# Patient Record
Sex: Female | Born: 1955 | Race: White | Hispanic: No | Marital: Single | Smoking: Never smoker
Health system: Southern US, Community
[De-identification: ages and names within clinical notes are randomized; demographics above are authoritative.]

## PROBLEM LIST (undated history)

## (undated) DIAGNOSIS — D509 Iron deficiency anemia, unspecified: Secondary | ICD-10-CM

## (undated) DIAGNOSIS — I2510 Atherosclerotic heart disease of native coronary artery without angina pectoris: Secondary | ICD-10-CM

## (undated) DIAGNOSIS — E119 Type 2 diabetes mellitus without complications: Secondary | ICD-10-CM

## (undated) DIAGNOSIS — F419 Anxiety disorder, unspecified: Secondary | ICD-10-CM

## (undated) DIAGNOSIS — E039 Hypothyroidism, unspecified: Secondary | ICD-10-CM

## (undated) DIAGNOSIS — M1990 Unspecified osteoarthritis, unspecified site: Secondary | ICD-10-CM

## (undated) DIAGNOSIS — R002 Palpitations: Secondary | ICD-10-CM

## (undated) DIAGNOSIS — E559 Vitamin D deficiency, unspecified: Secondary | ICD-10-CM

## (undated) DIAGNOSIS — K219 Gastro-esophageal reflux disease without esophagitis: Secondary | ICD-10-CM

## (undated) DIAGNOSIS — C801 Malignant (primary) neoplasm, unspecified: Secondary | ICD-10-CM

## (undated) DIAGNOSIS — E538 Deficiency of other specified B group vitamins: Secondary | ICD-10-CM

## (undated) DIAGNOSIS — I10 Essential (primary) hypertension: Secondary | ICD-10-CM

## (undated) HISTORY — PX: BREAST BIOPSY: SHX20

## (undated) HISTORY — DX: Essential (primary) hypertension: I10

## (undated) HISTORY — DX: Iron deficiency anemia, unspecified: D50.9

## (undated) HISTORY — PX: TONSILLECTOMY: SUR1361

## (undated) HISTORY — DX: Palpitations: R00.2

## (undated) HISTORY — PX: ABDOMINAL HYSTERECTOMY: SHX81

## (undated) HISTORY — DX: Vitamin D deficiency, unspecified: E55.9

## (undated) HISTORY — DX: Type 2 diabetes mellitus without complications: E11.9

## (undated) HISTORY — DX: Atherosclerotic heart disease of native coronary artery without angina pectoris: I25.10

## (undated) HISTORY — DX: Unspecified osteoarthritis, unspecified site: M19.90

## (undated) HISTORY — DX: Deficiency of other specified B group vitamins: E53.8

## (undated) HISTORY — DX: Gastro-esophageal reflux disease without esophagitis: K21.9

## (undated) HISTORY — DX: Anxiety disorder, unspecified: F41.9

---

## 2001-11-23 DIAGNOSIS — E119 Type 2 diabetes mellitus without complications: Secondary | ICD-10-CM

## 2001-11-23 HISTORY — DX: Type 2 diabetes mellitus without complications: E11.9

## 2007-11-24 DIAGNOSIS — I10 Essential (primary) hypertension: Secondary | ICD-10-CM

## 2007-11-24 HISTORY — DX: Essential (primary) hypertension: I10

## 2008-07-19 ENCOUNTER — Emergency Department (HOSPITAL_COMMUNITY): Admission: EM | Admit: 2008-07-19 | Discharge: 2008-07-19 | Payer: Self-pay | Admitting: Emergency Medicine

## 2010-03-31 ENCOUNTER — Emergency Department (HOSPITAL_COMMUNITY): Admission: EM | Admit: 2010-03-31 | Discharge: 2010-03-31 | Payer: Self-pay | Admitting: Emergency Medicine

## 2010-06-02 ENCOUNTER — Ambulatory Visit (HOSPITAL_COMMUNITY): Admission: RE | Admit: 2010-06-02 | Discharge: 2010-06-02 | Payer: Self-pay | Admitting: Family Medicine

## 2010-12-10 ENCOUNTER — Emergency Department (HOSPITAL_COMMUNITY)
Admission: EM | Admit: 2010-12-10 | Discharge: 2010-12-11 | Payer: Self-pay | Source: Home / Self Care | Admitting: Emergency Medicine

## 2010-12-15 LAB — CBC
HCT: 43.6 % (ref 36.0–46.0)
MCHC: 33.9 g/dL (ref 30.0–36.0)
Platelets: 169 10*3/uL (ref 150–400)
RBC: 5.1 MIL/uL (ref 3.87–5.11)
WBC: 8.1 10*3/uL (ref 4.0–10.5)

## 2010-12-15 LAB — URINALYSIS, ROUTINE W REFLEX MICROSCOPIC
Protein, ur: NEGATIVE mg/dL
Urine Glucose, Fasting: >1000 mg/dL — AB
pH: 5.5 (ref 5.0–8.0)

## 2010-12-15 LAB — URINE MICROSCOPIC-ADD ON

## 2010-12-15 LAB — DIFFERENTIAL
Basophils Relative: 1 % (ref 0–1)
Eosinophils Absolute: 0.2 10*3/uL (ref 0.0–0.7)
Eosinophils Relative: 2 % (ref 0–5)
Lymphocytes Relative: 12 % (ref 12–46)
Lymphs Abs: 1 10*3/uL (ref 0.7–4.0)
Monocytes Absolute: 0.6 10*3/uL (ref 0.1–1.0)
Monocytes Relative: 8 % (ref 3–12)
Neutro Abs: 6.3 10*3/uL (ref 1.7–7.7)
Neutrophils Relative %: 78 % — ABNORMAL HIGH (ref 43–77)

## 2010-12-15 LAB — BASIC METABOLIC PANEL
Calcium: 8.9 mg/dL (ref 8.4–10.5)
GFR calc non Af Amer: >60 mL/min (ref >60)
Glucose, Bld: 400 mg/dL — ABNORMAL HIGH (ref 70–99)

## 2011-01-10 ENCOUNTER — Emergency Department (HOSPITAL_COMMUNITY)
Admission: EM | Admit: 2011-01-10 | Discharge: 2011-01-10 | Disposition: A | Discharge status: Home / Self Care | Payer: Medicare Other | Attending: Emergency Medicine | Admitting: Emergency Medicine

## 2011-01-10 DIAGNOSIS — M129 Arthropathy, unspecified: Secondary | ICD-10-CM | POA: Insufficient documentation

## 2011-01-10 DIAGNOSIS — R21 Rash and other nonspecific skin eruption: Secondary | ICD-10-CM | POA: Insufficient documentation

## 2011-01-10 DIAGNOSIS — E119 Type 2 diabetes mellitus without complications: Secondary | ICD-10-CM | POA: Insufficient documentation

## 2011-01-10 DIAGNOSIS — I10 Essential (primary) hypertension: Secondary | ICD-10-CM | POA: Insufficient documentation

## 2011-01-10 DIAGNOSIS — R0602 Shortness of breath: Secondary | ICD-10-CM | POA: Insufficient documentation

## 2011-02-10 LAB — BASIC METABOLIC PANEL WITH GFR
Chloride: 98 meq/L (ref 96–112)
Creatinine, Ser: 0.78 mg/dL (ref 0.4–1.2)
GFR calc Af Amer: >60 mL/min (ref >60)
GFR calc non Af Amer: >60 mL/min (ref >60)

## 2011-02-10 LAB — BASIC METABOLIC PANEL
BUN: 9 mg/dL (ref 6–23)
CO2: 28 mEq/L (ref 19–32)
Calcium: 9.1 mg/dL (ref 8.4–10.5)
Glucose, Bld: 337 mg/dL — ABNORMAL HIGH (ref 70–99)
Potassium: 4.2 mEq/L (ref 3.5–5.1)
Sodium: 132 mEq/L — ABNORMAL LOW (ref 135–145)

## 2011-02-10 LAB — CBC
HCT: 41.1 % (ref 36.0–46.0)
Hemoglobin: 14.3 g/dL (ref 12.0–15.0)
MCHC: 34.8 g/dL (ref 30.0–36.0)
MCV: 86.6 fL (ref 78.0–100.0)
Platelets: 195 K/uL (ref 150–400)
RBC: 4.74 MIL/uL (ref 3.87–5.11)
RDW: 13.9 % (ref 11.5–15.5)
WBC: 9.4 K/uL (ref 4.0–10.5)

## 2011-02-10 LAB — URINALYSIS, ROUTINE W REFLEX MICROSCOPIC
Bilirubin Urine: NEGATIVE
Glucose, UA: >1000 mg/dL — AB
Hgb urine dipstick: NEGATIVE
Ketones, ur: NEGATIVE mg/dL
Leukocytes, UA: NEGATIVE
Nitrite: NEGATIVE
Protein, ur: NEGATIVE mg/dL
Specific Gravity, Urine: 1.02 (ref 1.005–1.030)
Urobilinogen, UA: 0.2 mg/dL (ref 0.0–1.0)
pH: 5.5 (ref 5.0–8.0)

## 2011-02-10 LAB — GLUCOSE, CAPILLARY
Glucose-Capillary: 196 mg/dL — ABNORMAL HIGH (ref 70–99)
Glucose-Capillary: 306 mg/dL — ABNORMAL HIGH (ref 70–99)

## 2011-02-10 LAB — DIFFERENTIAL
Basophils Absolute: 0.1 10*3/uL (ref 0.0–0.1)
Basophils Relative: 1 % (ref 0–1)
Eosinophils Absolute: 0.3 10*3/uL (ref 0.0–0.7)
Eosinophils Relative: 3 % (ref 0–5)
Lymphocytes Relative: 27 % (ref 12–46)
Lymphs Abs: 2.5 K/uL (ref 0.7–4.0)
Monocytes Absolute: 0.6 10*3/uL (ref 0.1–1.0)
Monocytes Relative: 7 % (ref 3–12)
Neutro Abs: 5.8 10*3/uL (ref 1.7–7.7)
Neutrophils Relative %: 62 % (ref 43–77)

## 2011-02-10 LAB — KETONES, QUALITATIVE: Acetone, Bld: NEGATIVE

## 2011-02-10 LAB — URINE MICROSCOPIC-ADD ON

## 2011-03-31 ENCOUNTER — Other Ambulatory Visit: Payer: Self-pay | Admitting: Family Medicine

## 2011-03-31 DIAGNOSIS — R921 Mammographic calcification found on diagnostic imaging of breast: Secondary | ICD-10-CM

## 2011-04-02 ENCOUNTER — Encounter: Payer: Self-pay | Admitting: Cardiology

## 2011-04-03 ENCOUNTER — Ambulatory Visit (INDEPENDENT_AMBULATORY_CARE_PROVIDER_SITE_OTHER): Payer: Medicare Other | Admitting: Cardiology

## 2011-04-03 ENCOUNTER — Encounter: Payer: Self-pay | Admitting: Cardiology

## 2011-04-03 VITALS — BP 120/77 | HR 70 | Ht 67.0 in | Wt 325.0 lb

## 2011-04-03 DIAGNOSIS — E118 Type 2 diabetes mellitus with unspecified complications: Secondary | ICD-10-CM | POA: Insufficient documentation

## 2011-04-03 DIAGNOSIS — I10 Essential (primary) hypertension: Secondary | ICD-10-CM | POA: Insufficient documentation

## 2011-04-03 DIAGNOSIS — R002 Palpitations: Secondary | ICD-10-CM

## 2011-04-03 DIAGNOSIS — E119 Type 2 diabetes mellitus without complications: Secondary | ICD-10-CM

## 2011-04-03 DIAGNOSIS — R0609 Other forms of dyspnea: Secondary | ICD-10-CM

## 2011-04-03 DIAGNOSIS — E66812 Obesity, class 2: Secondary | ICD-10-CM | POA: Insufficient documentation

## 2011-04-03 DIAGNOSIS — R0600 Dyspnea, unspecified: Secondary | ICD-10-CM

## 2011-04-03 NOTE — Assessment & Plan Note (Addendum)
Long-standing and episodic as noted, with increased frequency, sometimes associated with dizziness but no syncope. Also generalized feeling of chest discomfort with these symptoms, not with exertion. Resting ECG today shows normal sinus rhythm with leftward axis, normal intervals. Reports compliance with beta blocker therapy. Interesting to note low TSH, possibility of hyperthyroidism, which could be a contributor. Endocrinology consultation is pending. Further objective information will be obtained including an echocardiogram as well as a 48-hour Holter monitor. We will inform her of the results, and if further assessment is needed.

## 2011-04-03 NOTE — Assessment & Plan Note (Signed)
Blood pressure well-controlled today. 

## 2011-04-03 NOTE — Progress Notes (Signed)
Clinical Summary [name] is a 55 y.o.female referred for cardiology consultation by Dr. Isac Sarna with history of tachypalpitations and chest discomfort.  She describes a 2-3 year history of palpitations, described as a feeling of fast forceful heartbeat at times, other times a skip. Does not appear to be any clear precipitant. In general the symptoms have become more frequent, occurring a few times a day at this point. Sometimes she states of dizziness when this happens, but no syncope. Feels a general discomfort in her chest with the symptoms only, not with exertion.  She states she has been on beta blocker therapy for the symptoms, with some improvement, although no resolution. She has never worn a cardiac monitor.  Recent lab work showed creatinine 0.6, BUN 9, sodium 139, potassium 3.9, glucose 190, SGOT 47, SGPT 35, TSH 0.03, cholesterol 165, HDL 37, LDL 109, triglycerides 94, hemoglobin 13.7, platelets 194. Low TSH is noted, and the patient indicates that she has an endocrinology consultation pending early next week.  Allergies  Allergen Reactions  . Sulfa Antibiotics Nausea And Vomiting  . Doxepin Rash    Current outpatient prescriptions:escitalopram (LEXAPRO) 10 MG tablet, Take 10 mg by mouth daily.  , Disp: , Rfl: ;  gabapentin (NEURONTIN) 400 MG capsule, Take 400 mg by mouth 3 (three) times daily.  , Disp: , Rfl: ;  glimepiride (AMARYL) 4 MG tablet, Take 4 mg by mouth 2 (two) times daily. , Disp: , Rfl: ;  losartan (COZAAR) 100 MG tablet, Take 100 mg by mouth daily.  , Disp: , Rfl:  metFORMIN (GLUCOPHAGE) 500 MG tablet, Take 1,000 mg by mouth 2 (two) times daily with a meal.  , Disp: , Rfl: ;  metoprolol (TOPROL-XL) 100 MG 24 hr tablet, Take 100 mg by mouth daily.  , Disp: , Rfl: ;  mirtazapine (REMERON) 30 MG tablet, Take 30 mg by mouth at bedtime.  , Disp: , Rfl: ;  pantoprazole (PROTONIX) 40 MG tablet, Take 40 mg by mouth daily.  , Disp: , Rfl:  traMADol (ULTRAM) 50 MG tablet,  Take 50 mg by mouth every 6 (six) hours as needed.  , Disp: , Rfl:   Past Medical History  Diagnosis Date  . Type 2 diabetes mellitus 2003  . Palpitations     Tachypalpitations on beta blockers since 2010  . Essential hypertension, benign 2009  . GERD (gastroesophageal reflux disease)   . Anxiety   . Osteoarthritis     Past Surgical History  Procedure Date  . Tonsillectomy   . Abdominal hysterectomy     History of cervical cancer    Family History  Problem Relation Age of Onset  . Diabetes Father   . Stroke Sister 45  . Heart failure Mother   . Hypertension Father     Social History [name] reports that she has never smoked. She has never used smokeless tobacco. [name] reports that she does not drink alcohol.  Review of Systems No orthopnea or PND. No syncope. No melena or hematochezia. NYHA class II dyspnea on exertion. Otherwise reviewed and negative.  Physical Examination Filed Vitals:   04/03/11 1354  BP: 120/77  Pulse: 70  Morbidly obese woman in no acute distress. HEENT: Conjunctiva and lids normal, oropharynx with moist mucosa. Neck: Supple, increased girth, no obvious elevated JVP or carotid bruits, no thyromegaly. Lungs: Diminished but clear overall, nonlabored. Cardiac: Regular rate and rhythm, no S3 gallop or rub, distant overall. Abdomen: Obese, nontender, bowel sounds present. Skin:  Warm and dry. Extremities: Trace edema and stasis, distal pulses one plus. Musculoskeletal: No kyphosis. Neuropsychiatric: Alert and oriented x3, affect appropriate.   ECG Normal sinus rhythm with leftward axis, normal intervals.  Problem List and Plan

## 2011-04-03 NOTE — Assessment & Plan Note (Signed)
Followed by Dr Hall 

## 2011-04-03 NOTE — Assessment & Plan Note (Signed)
Weight loss is indicated. 

## 2011-04-03 NOTE — Patient Instructions (Signed)
   48 hour holter monitor applied today.  Return on Monday.   Your physician has requested that you have an echocardiogram. Echocardiography is a painless test that uses sound waves to create images of your heart. It provides your doctor with information about the size and shape of your heart and how well your heart's chambers and valves are working. This procedure takes approximately one hour. There are no restrictions for this procedure. If the results of your test are normal or stable, you will receive a letter.  If they are abnormal, the nurse will contact you by phone. Follow up as needed.

## 2011-04-06 ENCOUNTER — Ambulatory Visit
Admission: RE | Admit: 2011-04-06 | Discharge: 2011-04-06 | Disposition: A | Discharge status: Home / Self Care | Payer: Medicare Other | Source: Ambulatory Visit | Attending: Family Medicine | Admitting: Family Medicine

## 2011-04-06 ENCOUNTER — Other Ambulatory Visit (HOSPITAL_COMMUNITY): Payer: Self-pay | Admitting: "Endocrinology

## 2011-04-06 DIAGNOSIS — R921 Mammographic calcification found on diagnostic imaging of breast: Secondary | ICD-10-CM

## 2011-04-06 DIAGNOSIS — E0590 Thyrotoxicosis, unspecified without thyrotoxic crisis or storm: Secondary | ICD-10-CM

## 2011-04-08 ENCOUNTER — Encounter (HOSPITAL_COMMUNITY): Payer: Self-pay

## 2011-04-08 ENCOUNTER — Encounter (HOSPITAL_COMMUNITY)
Admission: RE | Admit: 2011-04-08 | Discharge: 2011-04-08 | Disposition: A | Discharge status: Home / Self Care | Payer: Medicare Other | Source: Ambulatory Visit | Attending: "Endocrinology | Admitting: "Endocrinology

## 2011-04-08 DIAGNOSIS — R197 Diarrhea, unspecified: Secondary | ICD-10-CM | POA: Insufficient documentation

## 2011-04-08 DIAGNOSIS — E0590 Thyrotoxicosis, unspecified without thyrotoxic crisis or storm: Secondary | ICD-10-CM | POA: Insufficient documentation

## 2011-04-08 DIAGNOSIS — R5381 Other malaise: Secondary | ICD-10-CM | POA: Insufficient documentation

## 2011-04-08 DIAGNOSIS — R110 Nausea: Secondary | ICD-10-CM | POA: Insufficient documentation

## 2011-04-08 MED ORDER — SODIUM IODIDE I 131 CAPSULE
8.0000 | Freq: Once | INTRAVENOUS | Status: AC | PRN
  Administered 2011-04-08: 8 via ORAL

## 2011-04-09 ENCOUNTER — Encounter (HOSPITAL_COMMUNITY): Payer: Medicare Other

## 2011-04-09 MED ORDER — SODIUM PERTECHNETATE TC 99M INJECTION
10.0000 | Freq: Once | INTRAVENOUS | Status: AC | PRN
  Administered 2011-04-09: 10 via INTRAVENOUS

## 2011-04-10 DIAGNOSIS — I2510 Atherosclerotic heart disease of native coronary artery without angina pectoris: Secondary | ICD-10-CM

## 2011-04-15 ENCOUNTER — Telehealth: Payer: Self-pay | Admitting: *Deleted

## 2011-04-15 NOTE — Telephone Encounter (Signed)
Also need to notify pt regarding Echo results.

## 2011-04-15 NOTE — Telephone Encounter (Signed)
Attempted to reach pt regarding results of Holter. Per Dr. Diona Browner, Holter shows: "Sinus Rhythm (51-142). Rare PVC's and PAC's with single brief burst of PSVT. No sustained arrhythmia or pauses.  No answer, no voicemail.

## 2011-04-17 NOTE — Telephone Encounter (Signed)
Pt notified of echo and monitor results.

## 2012-02-17 ENCOUNTER — Emergency Department (HOSPITAL_COMMUNITY)
Admission: EM | Admit: 2012-02-17 | Discharge: 2012-02-17 | Disposition: A | Discharge status: Home / Self Care | Payer: Medicare HMO | Attending: Emergency Medicine | Admitting: Emergency Medicine

## 2012-02-17 ENCOUNTER — Encounter (HOSPITAL_COMMUNITY): Payer: Self-pay | Admitting: *Deleted

## 2012-02-17 ENCOUNTER — Emergency Department (HOSPITAL_COMMUNITY): Payer: Medicare HMO

## 2012-02-17 DIAGNOSIS — L089 Local infection of the skin and subcutaneous tissue, unspecified: Secondary | ICD-10-CM

## 2012-02-17 DIAGNOSIS — K219 Gastro-esophageal reflux disease without esophagitis: Secondary | ICD-10-CM | POA: Insufficient documentation

## 2012-02-17 DIAGNOSIS — I10 Essential (primary) hypertension: Secondary | ICD-10-CM | POA: Insufficient documentation

## 2012-02-17 DIAGNOSIS — Z79899 Other long term (current) drug therapy: Secondary | ICD-10-CM | POA: Insufficient documentation

## 2012-02-17 DIAGNOSIS — E119 Type 2 diabetes mellitus without complications: Secondary | ICD-10-CM | POA: Insufficient documentation

## 2012-02-17 MED ORDER — DOXYCYCLINE HYCLATE 100 MG PO CAPS: 100.0000 mg | ORAL_CAPSULE | Freq: Two times a day (BID) | ORAL | Status: AC

## 2012-02-17 MED ORDER — VANCOMYCIN HCL IN DEXTROSE 1-5 GM/200ML-% IV SOLN
1000.0000 mg | Freq: Once | INTRAVENOUS | Status: AC
  Administered 2012-02-17: 1000 mg via INTRAVENOUS
  Filled 2012-02-17: qty 200

## 2012-02-17 NOTE — ED Notes (Signed)
C/o swelling/pain to left index finger at tip x 2 days; reports stuck this area with a pin, and some pus drained from site.

## 2012-02-17 NOTE — Discharge Instructions (Signed)
Follow up with your md Thursday for recheck

## 2012-02-18 NOTE — ED Provider Notes (Signed)
History     CSN: [id number]  Arrival date & time 02/17/12  0235   First MD Initiated Contact with Patient 02/17/12 [phone]      Chief Complaint  Patient presents with  . Hand Pain    (Consider location/radiation/quality/duration/timing/severity/associated sxs/prior treatment) Patient is a 56 y.o. female presenting with hand pain. The history is provided by the patient (Patient complains of pain to her left index finger. She has had redness and tenderness to the end of her finger for a couple days). No language interpreter was used.  Hand Pain This is a new problem. The current episode started yesterday. The problem occurs constantly. The problem has not changed since onset.Pertinent negatives include no chest pain, no abdominal pain and no headaches. The symptoms are aggravated by bending. The symptoms are relieved by nothing. She has tried nothing for the symptoms. The treatment provided no relief.    Past Medical History  Diagnosis Date  . Type 2 diabetes mellitus 2003  . Palpitations     Tachypalpitations on beta blockers since 2010  . Essential hypertension, benign 2009  . GERD (gastroesophageal reflux disease)   . Anxiety   . Osteoarthritis   . Diabetes mellitus     Past Surgical History  Procedure Date  . Tonsillectomy   . Abdominal hysterectomy     History of cervical cancer    Family History  Problem Relation Age of Onset  . Diabetes Father   . Stroke Sister 36  . Heart failure Mother   . Hypertension Father     History  Substance Use Topics  . Smoking status: Never Smoker   . Smokeless tobacco: Never Used  . Alcohol Use: No    OB History    Grav Para Term Preterm Abortions TAB SAB Ect Mult Living                  Review of Systems  Constitutional: Negative for fatigue.  HENT: Negative for congestion, sinus pressure and ear discharge.   Eyes: Negative for discharge.  Respiratory: Negative for cough.   Cardiovascular: Negative for chest pain.    Gastrointestinal: Negative for abdominal pain and diarrhea.  Genitourinary: Negative for frequency and hematuria.  Musculoskeletal: Negative for back pain.       Pain left index finger  Skin: Negative for rash.  Neurological: Negative for seizures and headaches.  Hematological: Negative.   Psychiatric/Behavioral: Negative for hallucinations.    Allergies  Sulfa antibiotics and Doxepin  Home Medications   Current Outpatient Rx  Name Route Sig Dispense Refill  . INSULIN DETEMIR 100 UNIT/ML [place] SOLN Subcutaneous Inject 20 Units into the skin at bedtime.    Marland Kitchen METHIMAZOLE 5 MG PO TABS Oral Take 2.5 mg by mouth daily.    Marland Kitchen PRAVASTATIN SODIUM 40 MG PO TABS Oral Take 40 mg by mouth daily.    Marland Kitchen DOXYCYCLINE HYCLATE 100 MG PO CAPS Oral Take 1 capsule (100 mg total) by mouth 2 (two) times daily. 20 capsule 0  . ESCITALOPRAM OXALATE 10 MG PO TABS Oral Take 10 mg by mouth daily.      Marland Kitchen GABAPENTIN 400 MG PO CAPS Oral Take 400 mg by mouth 3 (three) times daily.      Marland Kitchen GLIMEPIRIDE 4 MG PO TABS Oral Take 4 mg by mouth 2 (two) times daily.     Marland Kitchen LOSARTAN POTASSIUM 100 MG PO TABS Oral Take 100 mg by mouth daily.      Marland Kitchen METFORMIN HCL 500 MG  PO TABS Oral Take 1,000 mg by mouth 2 (two) times daily with a meal.      . METOPROLOL SUCCINATE ER 100 MG PO TB24 Oral Take 100 mg by mouth daily.      Marland Kitchen MIRTAZAPINE 30 MG PO TABS Oral Take 30 mg by mouth at bedtime.      Marland Kitchen PANTOPRAZOLE SODIUM 40 MG PO TBEC Oral Take 40 mg by mouth daily.      . TRAMADOL HCL 50 MG PO TABS Oral Take 50 mg by mouth every 6 (six) hours as needed.        BP 152/82  Pulse 85  Temp(Src) 98 F (36.7 C) (Oral)  Resp 18  Ht 5\' 8"  (1.727 m)  Wt 330 lb (149.687 kg)  BMI 50.18 kg/m2  SpO2 99%  Physical Exam  Constitutional: She is oriented to person, place, and time. She appears well-developed.  HENT:  Head: Normocephalic and atraumatic.  Eyes: Conjunctivae and EOM are normal. No scleral icterus.  Neck: Neck supple. No thyromegaly  present.  Cardiovascular: Normal rate and regular rhythm.  Exam reveals no gallop and no friction rub.   No murmur heard. Pulmonary/Chest: No stridor. She has no wheezes. She has no rales. She exhibits no tenderness.  Abdominal: She exhibits no distension. There is no tenderness. There is no rebound.  Musculoskeletal: Normal range of motion. She exhibits no edema.       Distal left index finger has some redness and tenderness to the PIP. Patient has full range of motion in that finger without any pain.  Lymphadenopathy:    She has no cervical adenopathy.  Neurological: She is oriented to person, place, and time. Coordination normal.  Skin: No rash noted. No erythema.  Psychiatric: She has a normal mood and affect. Her behavior is normal.    ED Course  Procedures (including critical care time)  Labs Reviewed - No data to display Dg Finger Index Left  02/17/2012  *RADIOLOGY REPORT*  Clinical Data: Left index finger pain and swelling.  LEFT INDEX FINGER 2+V  Comparison: None.  Findings: There is no evidence of fracture or dislocation.  No osseous erosions are seen to suggest osteomyelitis.  No radiopaque foreign bodies are seen.  The visualized joint spaces are grossly preserved.  Known soft tissue swelling is difficult to fully characterize on radiograph.  IMPRESSION: No evidence of osseous disruption; no radiopaque foreign bodies seen.  Original Report Authenticated By: Tonia Ghent, M.D.     1. Infected finger       MDM  Skin infection in distal left finger.  No evidence of tenosynovitis now.  Will tx with antibiotics and have pt seen the next day        [name], MD 02/18/12 [phone]

## 2013-03-18 ENCOUNTER — Encounter (HOSPITAL_COMMUNITY): Payer: Self-pay | Admitting: *Deleted

## 2013-03-18 ENCOUNTER — Emergency Department (HOSPITAL_COMMUNITY): Payer: Medicare HMO

## 2013-03-18 ENCOUNTER — Observation Stay (HOSPITAL_COMMUNITY)
Admission: EM | Admit: 2013-03-18 | Discharge: 2013-03-19 | Disposition: A | Discharge status: Home / Self Care | Payer: Medicare HMO | Attending: Internal Medicine | Admitting: Internal Medicine

## 2013-03-18 DIAGNOSIS — F411 Generalized anxiety disorder: Secondary | ICD-10-CM | POA: Insufficient documentation

## 2013-03-18 DIAGNOSIS — E0590 Thyrotoxicosis, unspecified without thyrotoxic crisis or storm: Secondary | ICD-10-CM | POA: Insufficient documentation

## 2013-03-18 DIAGNOSIS — E119 Type 2 diabetes mellitus without complications: Secondary | ICD-10-CM | POA: Insufficient documentation

## 2013-03-18 DIAGNOSIS — E785 Hyperlipidemia, unspecified: Secondary | ICD-10-CM | POA: Insufficient documentation

## 2013-03-18 DIAGNOSIS — K219 Gastro-esophageal reflux disease without esophagitis: Secondary | ICD-10-CM | POA: Insufficient documentation

## 2013-03-18 DIAGNOSIS — N390 Urinary tract infection, site not specified: Secondary | ICD-10-CM | POA: Insufficient documentation

## 2013-03-18 DIAGNOSIS — I10 Essential (primary) hypertension: Secondary | ICD-10-CM | POA: Insufficient documentation

## 2013-03-18 DIAGNOSIS — R55 Syncope and collapse: Principal | ICD-10-CM | POA: Insufficient documentation

## 2013-03-18 LAB — CBC WITH DIFFERENTIAL/PLATELET
Basophils Relative: 1 % (ref 0–1)
Eosinophils Absolute: 0.3 10*3/uL (ref 0.0–0.7)
Lymphs Abs: 4.1 10*3/uL — ABNORMAL HIGH (ref 0.7–4.0)
MCH: 28.2 pg (ref 26.0–34.0)
Neutrophils Relative %: 60 % (ref 43–77)
Platelets: 253 10*3/uL (ref 150–400)
RBC: 5.32 MIL/uL — ABNORMAL HIGH (ref 3.87–5.11)

## 2013-03-18 LAB — URINE MICROSCOPIC-ADD ON

## 2013-03-18 LAB — URINALYSIS, ROUTINE W REFLEX MICROSCOPIC
Specific Gravity, Urine: >1.03 — ABNORMAL HIGH (ref 1.005–1.030)
pH: 5.5 (ref 5.0–8.0)

## 2013-03-18 LAB — COMPREHENSIVE METABOLIC PANEL
ALT: 44 U/L — ABNORMAL HIGH (ref 0–35)
Albumin: 3.7 g/dL (ref 3.5–5.2)
Alkaline Phosphatase: 91 U/L (ref 39–117)
Potassium: 3.9 mEq/L (ref 3.5–5.1)
Sodium: 135 mEq/L (ref 135–145)
Total Protein: 8 g/dL (ref 6.0–8.3)

## 2013-03-18 MED ORDER — METHIMAZOLE 5 MG PO TABS
ORAL_TABLET | ORAL | Status: AC
  Filled 2013-03-18: qty 1

## 2013-03-18 MED ORDER — SODIUM CHLORIDE 0.9 % IV SOLN
INTRAVENOUS | Status: DC
  Administered 2013-03-18 – 2013-03-19 (×2): via INTRAVENOUS

## 2013-03-18 MED ORDER — ALUM & MAG HYDROXIDE-SIMETH 200-200-20 MG/5ML PO SUSP: 30.0000 mL | Freq: Four times a day (QID) | ORAL | Status: DC | PRN

## 2013-03-18 MED ORDER — SODIUM CHLORIDE 0.9 % IV BOLUS (SEPSIS)
1000.0000 mL | Freq: Once | INTRAVENOUS | Status: AC
  Administered 2013-03-18: 1000 mL via INTRAVENOUS

## 2013-03-18 MED ORDER — ACETAMINOPHEN 325 MG PO TABS: 650.0000 mg | ORAL_TABLET | Freq: Four times a day (QID) | ORAL | Status: DC | PRN

## 2013-03-18 MED ORDER — ONDANSETRON HCL 4 MG PO TABS: 4.0000 mg | ORAL_TABLET | Freq: Four times a day (QID) | ORAL | Status: DC | PRN

## 2013-03-18 MED ORDER — CEFTRIAXONE SODIUM 1 G IJ SOLR
1.0000 g | Freq: Once | INTRAMUSCULAR | Status: AC
  Administered 2013-03-18: 1 g via INTRAVENOUS
  Filled 2013-03-18 (×2): qty 10

## 2013-03-18 MED ORDER — INSULIN ASPART 100 UNIT/ML ~~LOC~~ SOLN
0.0000 [IU] | Freq: Three times a day (TID) | SUBCUTANEOUS | Status: DC
  Administered 2013-03-19: 3 [IU] via SUBCUTANEOUS

## 2013-03-18 MED ORDER — ENOXAPARIN SODIUM 40 MG/0.4ML ~~LOC~~ SOLN
40.0000 mg | SUBCUTANEOUS | Status: DC
  Administered 2013-03-18: 40 mg via SUBCUTANEOUS
  Filled 2013-03-18: qty 0.4

## 2013-03-18 MED ORDER — GABAPENTIN 300 MG PO CAPS
300.0000 mg | ORAL_CAPSULE | Freq: Three times a day (TID) | ORAL | Status: DC
  Administered 2013-03-18: 300 mg via ORAL
  Filled 2013-03-18 (×6): qty 1

## 2013-03-18 MED ORDER — PANTOPRAZOLE SODIUM 40 MG PO TBEC
40.0000 mg | DELAYED_RELEASE_TABLET | Freq: Every day | ORAL | Status: DC
  Administered 2013-03-18: 40 mg via ORAL
  Filled 2013-03-18: qty 1

## 2013-03-18 MED ORDER — METHIMAZOLE 5 MG PO TABS
5.0000 mg | ORAL_TABLET | Freq: Every day | ORAL | Status: DC
  Administered 2013-03-18: 5 mg via ORAL
  Filled 2013-03-18 (×3): qty 1

## 2013-03-18 MED ORDER — ACETAMINOPHEN 650 MG RE SUPP: 650.0000 mg | Freq: Four times a day (QID) | RECTAL | Status: DC | PRN

## 2013-03-18 MED ORDER — ZOLPIDEM TARTRATE 5 MG PO TABS: 5.0000 mg | ORAL_TABLET | Freq: Every evening | ORAL | Status: DC | PRN

## 2013-03-18 MED ORDER — ONDANSETRON HCL 4 MG/2ML IJ SOLN: 4.0000 mg | Freq: Four times a day (QID) | INTRAMUSCULAR | Status: DC | PRN

## 2013-03-18 MED ORDER — INSULIN ASPART 100 UNIT/ML ~~LOC~~ SOLN: 0.0000 [IU] | Freq: Every day | SUBCUTANEOUS | Status: DC

## 2013-03-18 NOTE — ED Notes (Addendum)
Pt was sitting outside in yard working on her flowers when her family reports that she "passed out", EMS arrived on scene to assess pt, bp was 104/48 pt requested to come to er POV. Pt alert, able to answer questions, denies any pain, does not remember what happened.

## 2013-03-18 NOTE — ED Notes (Signed)
Attempted to call report nurse not available

## 2013-03-18 NOTE — ED Notes (Signed)
Pt stated that she is unable to urinate at this time. Ray Glacken

## 2013-03-18 NOTE — ED Provider Notes (Addendum)
History    This chart was scribed for [name], MD scribed by Magnus Sinning. The patient was seen in room [facility] at 15:42   CSN: [id number]  Arrival date & time 03/18/13  1528      Chief Complaint  Patient presents with  . Loss of Consciousness    (Consider location/radiation/quality/duration/timing/severity/associated sxs/prior treatment) Patient is a 57 y.o. female presenting with syncope. The history is provided by the patient. No language interpreter was used.  Loss of Consciousness  This is a new problem. The current episode started 1 to 2 hours ago. The problem has been resolved. She lost consciousness for a period of 1 to 5 minutes. The problem is associated with normal activity and standing up. Associated symptoms include dizziness and light-headedness. Pertinent negatives include abdominal pain, chest pain, headaches, nausea and vomiting. Her past medical history is significant for DM and HTN. Her past medical history does not include seizures.   [name] is a 57 y.o. female who presents to the Emergency Department complaining of sudden onset LOC lasting approx one minute with associated light-headedness and dizziness prior to LOC event. The patient states that she was outside working in the yard. She says she stood up and felt light-headed. She explains that she sat down for 30 minutes after light-headedness onset. Her daughter reports pt was sitting in a chair when she had LOC event. Daughter states that the patient slumped over in the chair and was completely unresponsive.The patient reports that she did urinate on herself and explains she does not have hx of LOC. Pt notes that she has been having back pain today, but provides that it is typical baseline back pain.  The patient notes hx of DM, HTN (within the past month), and thyroid issues. She states she was started on Lisinopril for HTN recently by PCP and explains she has been on Cozaar for kidney disease  prevention. She states she takes metfromin for DM treatment and Neurontin for neuropathy in legs.   PCP: Dr. Felecia Shelling Past Medical History  Diagnosis Date  . Type 2 diabetes mellitus 2003  . Palpitations     Tachypalpitations on beta blockers since 2010  . Essential hypertension, benign 2009  . GERD (gastroesophageal reflux disease)   . Anxiety   . Osteoarthritis   . Diabetes mellitus     Past Surgical History  Procedure Laterality Date  . Tonsillectomy    . Abdominal hysterectomy      History of cervical cancer    Family History  Problem Relation Age of Onset  . Diabetes Father   . Stroke Sister 4  . Heart failure Mother   . Hypertension Father     History  Substance Use Topics  . Smoking status: Never Smoker   . Smokeless tobacco: Never Used  . Alcohol Use: No    Review of Systems  Eyes: Negative for visual disturbance.  Cardiovascular: Positive for syncope. Negative for chest pain.  Gastrointestinal: Negative for nausea, vomiting, abdominal pain and diarrhea.  Genitourinary: Positive for frequency (Since onset of lisonpril ). Negative for dysuria and difficulty urinating.  Skin: Negative for rash.  Neurological: Positive for dizziness, syncope and light-headedness. Negative for headaches.  All other systems reviewed and are negative.    Allergies  Sulfa antibiotics and Doxepin  Home Medications   Current Outpatient Rx  Name  Route  Sig  Dispense  Refill  . escitalopram (LEXAPRO) 10 MG tablet   Oral   Take 10 mg  by mouth daily.           Marland Kitchen gabapentin (NEURONTIN) 400 MG capsule   Oral   Take 400 mg by mouth 3 (three) times daily.           Marland Kitchen glimepiride (AMARYL) 4 MG tablet   Oral   Take 4 mg by mouth 2 (two) times daily.          . insulin detemir (LEVEMIR) 100 UNIT/ML injection   Subcutaneous   Inject 20 Units into the skin at bedtime.         Marland Kitchen losartan (COZAAR) 100 MG tablet   Oral   Take 100 mg by mouth daily.           .  metFORMIN (GLUCOPHAGE) 500 MG tablet   Oral   Take 1,000 mg by mouth 2 (two) times daily with a meal.           . methimazole (TAPAZOLE) 5 MG tablet   Oral   Take 2.5 mg by mouth daily.         . metoprolol (TOPROL-XL) 100 MG 24 hr tablet   Oral   Take 100 mg by mouth daily.           . mirtazapine (REMERON) 30 MG tablet   Oral   Take 30 mg by mouth at bedtime.           . pantoprazole (PROTONIX) 40 MG tablet   Oral   Take 40 mg by mouth daily.           . pravastatin (PRAVACHOL) 40 MG tablet   Oral   Take 40 mg by mouth daily.         . traMADol (ULTRAM) 50 MG tablet   Oral   Take 50 mg by mouth every 6 (six) hours as needed.             BP 78/31  Pulse 98  Temp(Src) 97.1 F (36.2 C) (Oral)  Resp 18  Ht 5\' 8"  (1.727 m)  Wt 316 lb (143.337 kg)  BMI 48.06 kg/m2  SpO2 100%  Physical Exam  Nursing note and vitals reviewed. Constitutional: She is oriented to person, place, and time. She appears well-developed and well-nourished. No distress.  HENT:  Head: Normocephalic and atraumatic.  Eyes: Conjunctivae and EOM are normal.  Neck: Neck supple. No tracheal deviation present.  Cardiovascular: Normal rate.   Pulmonary/Chest: Effort normal. No respiratory distress.  Abdominal: She exhibits no distension.  Musculoskeletal: Normal range of motion.  No CVA tenderness  Neurological: She is alert and oriented to person, place, and time. No sensory deficit.  Skin: Skin is dry.  Psychiatric: She has a normal mood and affect. Her behavior is normal.    ED Course  Procedures (including critical care time) DIAGNOSTIC STUDIES: Oxygen Saturation is 100% on room air, normal by my interpretation.    COORDINATION OF CARE: 15:43: Physical exam provided.  15:50: Intent provided to order cardiac workup involving head ct, cxr, cbc, troponin, and  urinalysis. Pt is agreeable.   Labs Reviewed  CBC WITH DIFFERENTIAL - Abnormal; Notable for the following:    WBC  13.5 (*)    RBC 5.32 (*)    Neutro Abs 8.0 (*)    Lymphs Abs 4.1 (*)    All other components within normal limits  COMPREHENSIVE METABOLIC PANEL - Abnormal; Notable for the following:    Glucose, Bld 187 (*)    AST 59 (*)  ALT 44 (*)    GFR calc non Af Amer 56 (*)    GFR calc Af Amer 65 (*)    All other components within normal limits  TROPONIN I  URINALYSIS, ROUTINE W REFLEX MICROSCOPIC   Dg Chest 2 View  03/18/2013  *RADIOLOGY REPORT*  Clinical Data: Syncope.  CHEST - 2 VIEW  Comparison: 03/31/2010 chest radiograph  Findings: The cardiomediastinal silhouette is unremarkable. Right hemidiaphragm elevation again noted. There is no evidence of focal airspace disease, pulmonary edema, suspicious pulmonary nodule/mass, pleural effusion, or pneumothorax. No acute bony abnormalities are identified.  IMPRESSION: No evidence of acute cardiopulmonary disease.   Original Report Authenticated By: Harmon Pier, M.D.    Ct Head Wo Contrast  03/18/2013  *RADIOLOGY REPORT*  Clinical Data: Syncope  CT HEAD WITHOUT CONTRAST  Technique:  Contiguous axial images were obtained from the base of the skull through the vertex without contrast.  Comparison: 07/19/2008  Findings: Ventricle size is normal.  Negative for acute infarct. Negative for hemorrhage or mass.  No shift of midline structures. Calvarium is intact.  IMPRESSION: Negative   Original Report Authenticated By: Janeece Riggers, M.D.      No diagnosis found.    Date: 03/18/2013  Rate: 81  Rhythm: normal sinus rhythm  QRS Axis: left  Intervals: normal  ST/T Wave abnormalities: nonspecific ST changes  Conduction Disutrbances:none  Narrative Interpretation:   Old EKG Reviewed: rate has decreased from ekg 12/10/10   MDM  56 y.o. Diabetic morbidly obese female who presents with syncope.  She has recently had anti hypertensive agent added- cozaar.  She was orhtostatic with good response to fluids.  EKG no acute changes.  Urine possible infection-  being cultured and rocephin given.  Suspect volume depletion with new onset of angiotensin 2 inhibitor, but needs to be assessed further for infection and arrhythmia.         [name], MD  I personally performed the services described in this documentation, which was scribed in my presence. The recorded information has been reviewed and considered.  03/18/13 1825  [name], MD 03/19/13 1610

## 2013-03-19 MED ORDER — NITROFURANTOIN MONOHYD MACRO 100 MG PO CAPS: 100.0000 mg | ORAL_CAPSULE | Freq: Two times a day (BID) | ORAL | Status: DC

## 2013-03-19 NOTE — Progress Notes (Signed)
03/19/13 1100  Patient left floor in stable condition via w/c accompanied by nurse tech. Discharged home. Earnstine Regal, RN

## 2013-03-19 NOTE — Progress Notes (Signed)
03/19/13 1041 Reviewed discharge instructions with patient via teachback. Given copy of instructions, medication list, prescription, and f/u appointment information. States will call for appointment tomorrow. Reviewed syncope education sheet and when to call MD. States understands. IV site d/c'd per nurse tech, within normal limits. Denies dizziness or other discomfort at this time. Pt in stable condition awaiting discharge home. Earnstine Regal, RN

## 2013-03-20 LAB — URINE CULTURE: Culture: NO GROWTH

## 2013-03-20 NOTE — Discharge Summary (Signed)
NAMEJAILAH, [name]                  ACCOUNT NO.:  [id number]  MEDICAL RECORD NO.:  [id number]  LOCATION:  A316                          FACILITY:  [facility]  PHYSICIAN:  Kingsley Callander. Ouida Sills, MD       DATE OF BIRTH:  January 25, 1956  DATE OF ADMISSION:  03/18/2013 DATE OF DISCHARGE:  04/27/2014LH                              DISCHARGE SUMMARY   DISCHARGE DIAGNOSES: 1. Syncope. 2. Urinary tract infection. 3. Diabetes. 4. Hypertension. 5. Gastroesophageal reflux disease. 6. Anxiety. 7. Hyperlipidemia. 8. Hyperthyroidism  HOSPITAL COURSE:  This patient is a 57 year old female, who presented after having a syncopal episode at home.  She was sitting at that time and began to feel strange she states, she then lost consciousness as witnessed by her family.  She had been sitting and holding some seed, preparing to plan a garden.  She returned to consciousness and had no neurologic deficits.  She had involuntarily urinated.  There had been no witnessed seizure activity.  She did not have any pain or diaphoresis. She was evaluated in the emergency room with a CT scan of the brain which revealed no acute abnormality.  Her troponin I was less than 0.30. She was found to have evidence of urinary tract infection with 7 to 10 white cells, 7 to 10 red cells, and many bacteria on urinalysis.  She admitted to some recent mild dysuria.  She has a history of diabetes, but did not experience hypoglycemia, her glucose was 187 on presentation.  Her white count was mildly elevated at 13.5.  She did not have fever.  Her EKG revealed sinus rhythm.  She was hospitalized for observation overnight.  There were no arrhythmias.  She had no recurrent syncopal episodes.  She was orthostatic in the emergency room and was treated with IV fluids.  She had recently been started on losartan.  She is also on lisinopril.  Losartan was discontinued.  Blood pressure is normal on the morning of discharge.  She is felt to be stable  for discharge with followup in 1 week with her primary physician, Dr. Felecia Shelling.  She will be treated with Macrobid twice a day for 5 days for UTI.  As noted, losartan will be held.  DISCHARGE MEDICATIONS: 1. Macrobid 100 mg b.i.d. for 5 days. 2. Gabapentin 300 mg t.i.d. 3. Victoza 1.2 mg daily. 4. Lisinopril 10 mg daily. 5. Metformin 1000 mg b.i.d. 6. Tapazole 5 mg daily. 7. Toprol-XL 100 mg daily. 8. Pantoprazole 40 mg daily. 9. Pravastatin 40 mg daily.     Kingsley Callander. Ouida Sills, MD     ROF/MEDQ  D:  03/19/2013  T:  03/20/2013  Job:  [id number]

## 2013-03-20 NOTE — H&P (Signed)
[name], [name]                  ACCOUNT NO.:  [id number]  MEDICAL RECORD NO.:  [id number]  LOCATION:  A316                          FACILITY:  [facility]  PHYSICIAN:  Kingsley Callander. Ouida Sills, MD       DATE OF BIRTH:  1956/08/21  DATE OF ADMISSION:  03/18/2013 DATE OF DISCHARGE:  04/27/2014LH                             HISTORY & PHYSICAL   CHIEF COMPLAINT:  Passed out.  HISTORY OF PRESENT ILLNESS:  This patient is a 57 year old white female, patient of Dr. Letitia Neri, who presented to the emergency room after losing consciousness while sitting in her yard at home.  She had been preparing to plan her garden.  She began to feel strange and then briefly lost consciousness.  This was witnessed by her family.  She was brought to the emergency room.  She was found to have no neurologic deficits.  She felt back to baseline.  There was no witnessed seizure activity.  She did not have any pain or diaphoresis.  She underwent a CT scan of the brain in the emergency room, which revealed no acute abnormality.  She was in sinus rhythm.  She did not experience hypoglycemia.  She does have a history of diabetes, however, her glucose has not been low.  She was asymptomatic at the time of my evaluation.  She was found to have a urinary tract infection, however, and had admitted to some recent mild dysuria.  Of note is the fact that she has recently been started on losartan in addition to lisinopril and metoprolol.  She was found to be orthostatic upon standing on her check in the ER.  PAST MEDICAL HISTORY: 1. Diabetes. 2. Diabetic neuropathy. 3. Hypertension. 4. Hypothyroidism. 5. Hyperlipidemia. 6. GERD.  MEDICATIONS: 1. Gabapentin 300 mg t.i.d. 2. Victoza 1.2 mg daily. 3. Lisinopril 10 mg daily. 4. Losartan 100 mg daily. 5. Metformin 1000 mg b.i.d. 6. Tapazole 5 mg daily. 7. Toprol-XL 100 mg daily. 8. Pantoprazole 40 mg daily. 9. Pravastatin 40 mg daily.  ALLERGIES:  SULFA and DOXEPIN.  SOCIAL  HISTORY:  She does not smoke cigarettes, drink alcohol, or use recreational drugs.  FAMILY HISTORY:  Remarkable for diabetes in her father.  Congestive heart failure in her mother.  Stroke in her sister and hypertension in her father.  REVIEW OF SYSTEMS:  She has had no fever or chills.  She denies any previous loss of consciousness.  She has not had chest pain, abdominal pain, vomiting, diarrhea, gross hematuria, or headache.  PHYSICAL EXAMINATION:  VITAL SIGNS:  Initial blood pressure 78/31, pulse 98, respirations 18, temperature 97.1, oxygen saturation 100%. GENERAL:  She is alert and oriented at the time of my exam. HEENT:  Eyes, nose, and oropharynx appeared normal. NECK:  Supple with no JVD or thyromegaly. LUNGS:  Clear. HEART:  Regular with no murmurs. ABDOMEN:  Soft and nontender with no palpable organomegaly. EXTREMITIES:  Normal pulses with no clubbing or edema. NEUROLOGIC:  No focal weakness.  She is alert and oriented.  Mood and affect are appropriate. LYMPH NODES:  No cervical or supraclavicular enlargement. SKIN:  Warm and dry.  LABORATORY DATA:  Sodium 135, potassium 3.9,  bicarb 26, BUN 19, creatinine 1.09, calcium 9.6, glucose 187.  Troponin I less than 0.30. White count 13.5, hemoglobin 15, platelets 253,000, 60 segs, 30 lymphs. Urinalysis reveals 7 to 10 white cells and 7 to 10 red cells with many bacteria, greater than 100 mg/dL of glucose, and trace ketones.  IMAGING STUDIES:  EKG reveals sinus rhythm.  CT scan of brain reveals no sign of acute infarct.  IMPRESSION/PLAN: 1. Syncope, likely vasovagal.  She was orthostatic initially.  She has     been bolused with IV fluids.  She is feeling well now.  She will be     observed in a monitored setting.  Losartan will be held. 2. Urinary tract infection.  She was treated with a g of Rocephin in     the ER.  Urine culture has been obtained.  She will be continued on     treatment with Macrobid. 3.  Hypertension. 4. Diabetes. 5. Hyperlipidemia. 6. Hypothyroidism. 7. Neuropathy.     Kingsley Callander. Ouida Sills, MD     ROF/MEDQ  D:  03/19/2013  T:  03/20/2013  Job:  [id number]

## 2013-03-21 ENCOUNTER — Encounter (HOSPITAL_COMMUNITY): Payer: Self-pay

## 2013-03-21 ENCOUNTER — Emergency Department (HOSPITAL_COMMUNITY)
Admission: EM | Admit: 2013-03-21 | Discharge: 2013-03-21 | Disposition: A | Discharge status: Home / Self Care | Payer: Medicare HMO | Attending: Emergency Medicine | Admitting: Emergency Medicine

## 2013-03-21 ENCOUNTER — Emergency Department (HOSPITAL_COMMUNITY): Payer: Medicare HMO

## 2013-03-21 DIAGNOSIS — R5381 Other malaise: Secondary | ICD-10-CM | POA: Insufficient documentation

## 2013-03-21 DIAGNOSIS — E860 Dehydration: Secondary | ICD-10-CM | POA: Insufficient documentation

## 2013-03-21 DIAGNOSIS — I10 Essential (primary) hypertension: Secondary | ICD-10-CM | POA: Insufficient documentation

## 2013-03-21 DIAGNOSIS — K219 Gastro-esophageal reflux disease without esophagitis: Secondary | ICD-10-CM | POA: Insufficient documentation

## 2013-03-21 DIAGNOSIS — R55 Syncope and collapse: Secondary | ICD-10-CM | POA: Insufficient documentation

## 2013-03-21 DIAGNOSIS — I959 Hypotension, unspecified: Secondary | ICD-10-CM

## 2013-03-21 DIAGNOSIS — E119 Type 2 diabetes mellitus without complications: Secondary | ICD-10-CM | POA: Insufficient documentation

## 2013-03-21 DIAGNOSIS — R5383 Other fatigue: Secondary | ICD-10-CM | POA: Insufficient documentation

## 2013-03-21 DIAGNOSIS — Z8739 Personal history of other diseases of the musculoskeletal system and connective tissue: Secondary | ICD-10-CM | POA: Insufficient documentation

## 2013-03-21 DIAGNOSIS — Z79899 Other long term (current) drug therapy: Secondary | ICD-10-CM | POA: Insufficient documentation

## 2013-03-21 LAB — CBC WITH DIFFERENTIAL/PLATELET
Basophils Absolute: 0.1 10*3/uL (ref 0.0–0.1)
Lymphocytes Relative: 33 % (ref 12–46)
Lymphs Abs: 3 10*3/uL (ref 0.7–4.0)
Neutro Abs: 5.4 10*3/uL (ref 1.7–7.7)
Neutrophils Relative %: 59 % (ref 43–77)
Platelets: 173 10*3/uL (ref 150–400)
RBC: 5.02 MIL/uL (ref 3.87–5.11)
RDW: 13.8 % (ref 11.5–15.5)
WBC: 9.3 10*3/uL (ref 4.0–10.5)

## 2013-03-21 LAB — COMPREHENSIVE METABOLIC PANEL
ALT: 34 U/L (ref 0–35)
AST: 38 U/L — ABNORMAL HIGH (ref 0–37)
Alkaline Phosphatase: 74 U/L (ref 39–117)
CO2: 30 mEq/L (ref 19–32)
Calcium: 9.1 mg/dL (ref 8.4–10.5)
Chloride: 97 mEq/L (ref 96–112)
GFR calc non Af Amer: >90 mL/min (ref >90)
Potassium: 4.7 mEq/L (ref 3.5–5.1)
Sodium: 133 mEq/L — ABNORMAL LOW (ref 135–145)
Total Bilirubin: 0.4 mg/dL (ref 0.3–1.2)

## 2013-03-21 LAB — URINE MICROSCOPIC-ADD ON

## 2013-03-21 LAB — URINALYSIS, ROUTINE W REFLEX MICROSCOPIC
Bilirubin Urine: NEGATIVE
Glucose, UA: 100 mg/dL — AB
Protein, ur: NEGATIVE mg/dL

## 2013-03-21 MED ORDER — SODIUM CHLORIDE 0.9 % IV BOLUS (SEPSIS)
1000.0000 mL | Freq: Once | INTRAVENOUS | Status: AC
  Administered 2013-03-21: 1000 mL via INTRAVENOUS

## 2013-03-21 NOTE — ED Provider Notes (Signed)
History  This chart was scribed for Carrie Lennert, MD by Shari Heritage, ED Scribe. The patient was seen in room [facility]. Patient's care was started at 0745.   CSN: [id number]  Arrival date & time 03/21/13  0730   First MD Initiated Contact with Patient 03/21/13 0745      Chief Complaint  Patient presents with  . Dizziness    Patient is a 57 y.o. female presenting with neurologic complaint. The history is provided by the patient. No language interpreter was used.  Neurologic Problem The primary symptoms include dizziness. Primary symptoms do not include headaches or seizures. The symptoms began 1 to 2 hours ago. The symptoms are improving. The neurological symptoms are diffuse.  Dizziness also occurs with weakness.  Additional symptoms include weakness. Additional symptoms do not include hallucinations. Medical issues also include diabetes and hypertension. Medical issues do not include seizures.    HPI Comments: [name] is a 57 y.o. female who presents to the Emergency Department complaining of dizziness and generalized weakness that began this morning upon waking. Patient states that her symptoms are gradually improving. She reports that she had similar symptoms on Saturday followed by a syncopal episode. Patient denies any syncope today. Patient was admitted to the hospital on Saturday for observation. She was given antibiotics in the ED, but hasn't filled her prescription yet for additional course of antibiotics. Per medical records, patient had no seizure activity, pain or diaphoresis associated with episode on Saturday. There wre no neurological deficits. Patient's CT scan of the brain in the ED was negative for acute abnormality. Her urine was positive for UTI. Patient was orthostatic in the ED and was treated with IV fluids. Patient has been taking losartan and lisinopril, but losartan was discontinued at that time. Blood pressure was normal yesterday morning upon discharge.  Patient was prescribed Macrobid to treat UTI. She has a medical history of diabetes, palpitations, GERD, hypertension, anxiety and osteoarthritis.  Past Medical History  Diagnosis Date  . Type 2 diabetes mellitus 2003  . Palpitations     Tachypalpitations on beta blockers since 2010  . Essential hypertension, benign 2009  . GERD (gastroesophageal reflux disease)   . Anxiety   . Osteoarthritis   . Diabetes mellitus     Past Surgical History  Procedure Laterality Date  . Tonsillectomy    . Abdominal hysterectomy      History of cervical cancer    Family History  Problem Relation Age of Onset  . Diabetes Father   . Stroke Sister 77  . Heart failure Mother   . Hypertension Father     History  Substance Use Topics  . Smoking status: Never Smoker   . Smokeless tobacco: Never Used  . Alcohol Use: No    OB History   Grav Para Term Preterm Abortions TAB SAB Ect Mult Living                  Review of Systems  Constitutional: Negative for appetite change and fatigue.  HENT: Negative for congestion, sinus pressure and ear discharge.   Eyes: Negative for discharge.  Respiratory: Negative for cough.   Cardiovascular: Negative for chest pain.  Gastrointestinal: Negative for abdominal pain and diarrhea.  Genitourinary: Negative for frequency and hematuria.  Musculoskeletal: Negative for back pain.  Skin: Negative for rash.  Neurological: Positive for dizziness and weakness. Negative for seizures and headaches.  Psychiatric/Behavioral: Negative for hallucinations.    Allergies  Sulfa antibiotics and Doxepin  Home Medications   Current Outpatient Rx  Name  Route  Sig  Dispense  Refill  . gabapentin (NEURONTIN) 300 MG capsule   Oral   Take 300 mg by mouth 3 (three) times daily.         . Liraglutide (VICTOZA) 18 MG/3ML SOLN injection   Subcutaneous   Inject 1.2 mg into the skin daily.         Marland Kitchen lisinopril (PRINIVIL,ZESTRIL) 10 MG tablet   Oral   Take 10 mg by  mouth daily.         . metFORMIN (GLUCOPHAGE) 1000 MG tablet   Oral   Take 1,000 mg by mouth 2 (two) times daily.         . methimazole (TAPAZOLE) 5 MG tablet   Oral   Take 5 mg by mouth daily.          . metoprolol (TOPROL-XL) 100 MG 24 hr tablet   Oral   Take 100 mg by mouth daily.           . nitrofurantoin, macrocrystal-monohydrate, (MACROBID) 100 MG capsule   Oral   Take 1 capsule (100 mg total) by mouth 2 (two) times daily.   10 capsule   0   . pantoprazole (PROTONIX) 40 MG tablet   Oral   Take 40 mg by mouth daily.           . pravastatin (PRAVACHOL) 40 MG tablet   Oral   Take 40 mg by mouth daily.           Triage Vitals: BP 99/40  Pulse 88  Temp(Src) 98.7 F (37.1 C) (Oral)  Resp 22  SpO2 93%  Physical Exam  Constitutional: She is oriented to person, place, and time. She appears well-developed.  HENT:  Head: Normocephalic.  Eyes: Conjunctivae and EOM are normal. No scleral icterus.  Neck: Neck supple. No thyromegaly present.  Cardiovascular: Normal rate and regular rhythm.  Exam reveals no gallop and no friction rub.   No murmur heard. Pulmonary/Chest: No stridor. She has no wheezes. She has no rales. She exhibits no tenderness.  Abdominal: She exhibits no distension. There is no tenderness. There is no rebound.  Musculoskeletal: Normal range of motion. She exhibits no edema.  Lymphadenopathy:    She has no cervical adenopathy.  Neurological: She is oriented to person, place, and time. Coordination normal.  Skin: No rash noted. No erythema.  Psychiatric: She has a normal mood and affect. Her behavior is normal.    ED Course  Procedures (including critical care time) DIAGNOSTIC STUDIES: Oxygen Saturation is 93% on room air, adequate by my interpretation.    COORDINATION OF CARE: 7:52 AM- BP ws 99/40 at triage. Will give 1000 ml of IV fluids. Will order labs and head CT to rule out acute anormalities. Patient informed of current plan for  treatment and evaluation and agrees with plan at this time.  9:38 AM- BP is now 120/60. Patient is improved after IV fluids. Feel that patient is stable for discharge at this time.    Labs Reviewed  COMPREHENSIVE METABOLIC PANEL - Abnormal; Notable for the following:    Sodium 133 (*)    Glucose, Bld 225 (*)    AST 38 (*)    All other components within normal limits  URINALYSIS, ROUTINE W REFLEX MICROSCOPIC - Abnormal; Notable for the following:    Glucose, UA 100 (*)    Hgb urine dipstick TRACE (*)    All other components  within normal limits  CBC WITH DIFFERENTIAL  LACTIC ACID, PLASMA  URINE MICROSCOPIC-ADD ON    Ct Head Wo Contrast  03/21/2013  *RADIOLOGY REPORT*  Clinical Data: Dizziness  CT HEAD WITHOUT CONTRAST  Technique:  Contiguous axial images were obtained from the base of the skull through the vertex without contrast.  Comparison: 03/18/2013  Findings: No acute intracranial abnormality.  Specifically, no hemorrhage, hydrocephalus, mass lesion, acute infarction, or significant intracranial injury.  No acute calvarial abnormality. Visualized paranasal sinuses and mastoids clear.  Orbital soft tissues unremarkable.  IMPRESSION: Negative.   Original Report Authenticated By: Charlett Nose, M.D.      1. Dehydration   2. Hypotension       MDM        The chart was scribed for me under my direct supervision.  I personally performed the history, physical, and medical decision making and all procedures in the evaluation of this patient.Carrie Lennert, MD 03/21/13 [phone]

## 2013-03-21 NOTE — ED Notes (Signed)
Pt reports was dizzy and had syncopal episode Saturday.  Reports was evaluated here and admitted.  PT also diagnosed with UTI but hasn't had antibiotics filled yet.  Pt reports woke up this am feeling like she did before she passed out Saturday.  Also c/o pain/stiffness in r side of neck x 1 month.

## 2013-04-07 NOTE — Progress Notes (Signed)
UR Chart Review Completed  

## 2014-11-30 ENCOUNTER — Encounter (HOSPITAL_COMMUNITY): Payer: Self-pay | Admitting: Emergency Medicine

## 2014-11-30 ENCOUNTER — Emergency Department (HOSPITAL_COMMUNITY)
Admission: EM | Admit: 2014-11-30 | Discharge: 2014-11-30 | Disposition: A | Discharge status: Home / Self Care | Payer: Medicare HMO | Attending: Emergency Medicine | Admitting: Emergency Medicine

## 2014-11-30 DIAGNOSIS — E119 Type 2 diabetes mellitus without complications: Secondary | ICD-10-CM | POA: Diagnosis not present

## 2014-11-30 DIAGNOSIS — F419 Anxiety disorder, unspecified: Secondary | ICD-10-CM | POA: Insufficient documentation

## 2014-11-30 DIAGNOSIS — K219 Gastro-esophageal reflux disease without esophagitis: Secondary | ICD-10-CM | POA: Diagnosis not present

## 2014-11-30 DIAGNOSIS — Z79899 Other long term (current) drug therapy: Secondary | ICD-10-CM | POA: Diagnosis not present

## 2014-11-30 DIAGNOSIS — K047 Periapical abscess without sinus: Secondary | ICD-10-CM | POA: Diagnosis not present

## 2014-11-30 DIAGNOSIS — K029 Dental caries, unspecified: Secondary | ICD-10-CM | POA: Diagnosis not present

## 2014-11-30 DIAGNOSIS — Z8739 Personal history of other diseases of the musculoskeletal system and connective tissue: Secondary | ICD-10-CM | POA: Diagnosis not present

## 2014-11-30 DIAGNOSIS — R220 Localized swelling, mass and lump, head: Secondary | ICD-10-CM | POA: Diagnosis present

## 2014-11-30 DIAGNOSIS — I10 Essential (primary) hypertension: Secondary | ICD-10-CM | POA: Diagnosis not present

## 2014-11-30 LAB — CBC WITH DIFFERENTIAL/PLATELET
Basophils Absolute: 0.1 10*3/uL (ref 0.0–0.1)
Basophils Relative: 1 % (ref 0–1)
Eosinophils Absolute: 0.2 10*3/uL (ref 0.0–0.7)
Eosinophils Relative: 2 % (ref 0–5)
HCT: 42.8 % (ref 36.0–46.0)
Hemoglobin: 13.9 g/dL (ref 12.0–15.0)
Lymphocytes Relative: 26 % (ref 12–46)
Lymphs Abs: 2.4 10*3/uL (ref 0.7–4.0)
MCH: 27.5 pg (ref 26.0–34.0)
MCHC: 32.5 g/dL (ref 30.0–36.0)
MCV: 84.8 fL (ref 78.0–100.0)
Monocytes Absolute: 0.7 10*3/uL (ref 0.1–1.0)
Monocytes Relative: 7 % (ref 3–12)
Neutro Abs: 6.1 10*3/uL (ref 1.7–7.7)
Neutrophils Relative %: 64 % (ref 43–77)
Platelets: 168 10*3/uL (ref 150–400)
RBC: 5.05 MIL/uL (ref 3.87–5.11)
RDW: 14.1 % (ref 11.5–15.5)
WBC: 9.4 10*3/uL (ref 4.0–10.5)

## 2014-11-30 LAB — BASIC METABOLIC PANEL
ANION GAP: 8 (ref 5–15)
BUN: 8 mg/dL (ref 6–23)
CO2: 28 mmol/L (ref 19–32)
CREATININE: 0.73 mg/dL (ref 0.50–1.10)
Calcium: 9 mg/dL (ref 8.4–10.5)
Chloride: 99 mEq/L (ref 96–112)
GFR calc non Af Amer: >90 mL/min (ref >90)
GLUCOSE: 208 mg/dL — AB (ref 70–99)
Potassium: 3.5 mmol/L (ref 3.5–5.1)
Sodium: 135 mmol/L (ref 135–145)

## 2014-11-30 MED ORDER — CLINDAMYCIN PHOSPHATE 900 MG/50ML IV SOLN
900.0000 mg | Freq: Once | INTRAVENOUS | Status: AC
  Administered 2014-11-30: 900 mg via INTRAVENOUS
  Filled 2014-11-30: qty 50

## 2014-11-30 MED ORDER — HYDROCODONE-ACETAMINOPHEN 5-325 MG PO TABS
1.0000 | ORAL_TABLET | Freq: Once | ORAL | Status: AC
  Administered 2014-11-30: 1 via ORAL
  Filled 2014-11-30: qty 1

## 2014-11-30 MED ORDER — HYDROCODONE-ACETAMINOPHEN 5-325 MG PO TABS: ORAL_TABLET | ORAL | Status: DC

## 2014-11-30 MED ORDER — FLUCONAZOLE 200 MG PO TABS: 200.0000 mg | ORAL_TABLET | Freq: Once | ORAL | Status: AC

## 2014-11-30 MED ORDER — CLINDAMYCIN HCL 150 MG PO CAPS: 150.0000 mg | ORAL_CAPSULE | Freq: Four times a day (QID) | ORAL | Status: DC

## 2014-11-30 NOTE — ED Provider Notes (Signed)
CSN: [id number]     Arrival date & time 11/30/14  0754 History   First MD Initiated Contact with Patient 11/30/14 [phone]     Chief Complaint  Patient presents with  . Oral Swelling     (Consider location/radiation/quality/duration/timing/severity/associated sxs/prior Treatment) HPI   [name] is a 59 y.o. female who presents to the Emergency Department complaining of facial swelling that has been progressing for three days.  She noticed a small area of tenderness to her chin and now states her chin feels swollen and tight and she also states it seems to be spreading to her right ear and across her right jaw.  She also reports having slight green nasal drainage and was concerned that she has a sinus infection.  She has not been taking any medications for her symtpoms.  She does admit to having poor dentition, but denies tooth pain with chewing.  She also denies fever, chills, chest pain, neck pain, difficulty swallowing or breathing.    Past Medical History  Diagnosis Date  . Type 2 diabetes mellitus 2003  . Palpitations     Tachypalpitations on beta blockers since 2010  . Essential hypertension, benign 2009  . GERD (gastroesophageal reflux disease)   . Anxiety   . Osteoarthritis   . Diabetes mellitus    Past Surgical History  Procedure Laterality Date  . Tonsillectomy    . Abdominal hysterectomy      History of cervical cancer   Family History  Problem Relation Age of Onset  . Diabetes Father   . Stroke Sister 37  . Heart failure Mother   . Hypertension Father    History  Substance Use Topics  . Smoking status: Never Smoker   . Smokeless tobacco: Never Used  . Alcohol Use: No   OB History    No data available     Review of Systems  Constitutional: Negative for fever and appetite change.  HENT: Positive for dental problem. Negative for congestion, facial swelling, sore throat and trouble swallowing.   Eyes: Negative for pain and visual disturbance.   Musculoskeletal: Negative for neck pain and neck stiffness.  Neurological: Negative for dizziness, facial asymmetry and headaches.  Hematological: Negative for adenopathy.  All other systems reviewed and are negative.     Allergies  Sulfa antibiotics and Doxepin  Home Medications   Prior to Admission medications   Medication Sig Start Date End Date Taking? Authorizing Provider  gabapentin (NEURONTIN) 300 MG capsule Take 300 mg by mouth 3 (three) times daily.    Historical Provider, MD  Liraglutide (VICTOZA) 18 MG/3ML SOLN injection Inject 1.2 mg into the skin daily.    Historical Provider, MD  lisinopril (PRINIVIL,ZESTRIL) 10 MG tablet Take 10 mg by mouth daily.    Historical Provider, MD  metFORMIN (GLUCOPHAGE) 1000 MG tablet Take 1,000 mg by mouth 2 (two) times daily.    Historical Provider, MD  methimazole (TAPAZOLE) 5 MG tablet Take 5 mg by mouth daily.     Historical Provider, MD  metoprolol (TOPROL-XL) 100 MG 24 hr tablet Take 100 mg by mouth daily.      Historical Provider, MD  pantoprazole (PROTONIX) 40 MG tablet Take 40 mg by mouth daily.      Historical Provider, MD  pravastatin (PRAVACHOL) 40 MG tablet Take 40 mg by mouth daily.    Historical Provider, MD   BP 156/98 mmHg  Pulse 72  Temp(Src) 97.1 F (36.2 C) (Temporal)  Resp 18  Ht 5\' 8"  (  1.727 m)  Wt 297 lb (134.718 kg)  BMI 45.17 kg/m2  SpO2 97% Physical Exam  Constitutional: She is oriented to person, place, and time. She appears well-developed and well-nourished. No distress.  HENT:  Head: Normocephalic and atraumatic.  Right Ear: Tympanic membrane and ear canal normal.  Left Ear: Tympanic membrane and ear canal normal.  Mouth/Throat: Uvula is midline, oropharynx is clear and moist and mucous membranes are normal. No trismus in the jaw. Dental caries present. No dental abscesses or uvula swelling.  Tenderness of the mid lower gums and lower central and right lateral incisor.  Dental caries of the lower teeth.   No drainable dental abscess, trismus, or sublingual abnml.    Neck: Normal range of motion, full passive range of motion without pain and phonation normal. Neck supple.  Cardiovascular: Normal rate, regular rhythm, normal heart sounds and intact distal pulses.   No murmur heard. Pulmonary/Chest: Effort normal and breath sounds normal. No respiratory distress.  Musculoskeletal: Normal range of motion.  Lymphadenopathy:    She has no cervical adenopathy.  Neurological: She is alert and oriented to person, place, and time. She exhibits normal muscle tone. Coordination normal.  Skin: Skin is warm and dry.  Nursing note and vitals reviewed.   ED Course  Procedures (including critical care time) Labs Review Labs Reviewed  BASIC METABOLIC PANEL - Abnormal; Notable for the following:    Glucose, Bld 208 (*)    All other components within normal limits  CBC WITH DIFFERENTIAL    Imaging Review No results found.   EKG Interpretation None      MDM   Final diagnoses:  Dental abscess    Patient is well appearing.  VSS.  Airway is patent.  Localized induration and soft tissue edema of the chin likely related to dental abscess.  No concerning sx's for infection to the floor of the mouth or deep structures of the neck.  Patient given IV clinda and rx, she agrees to close dental f/u and to return here if the sx's are not improving.  She appears stable for d/c and reports feeling much better after medications    Alora Gorey L. Ammie Ferrier 12/01/14 2043  Sharyon Cable, MD 12/03/14 1434

## 2014-11-30 NOTE — ED Provider Notes (Signed)
Patient seen/examined in the Emergency Department in conjunction with Midlevel Provider [place] Patient reports facial swelling Exam : chin is edematous with gingival tenderness Plan: suspect dental abscess She is appropriate for d/c home and outpatient management   Sharyon Cable, MD 11/30/14 [phone]

## 2014-11-30 NOTE — ED Notes (Signed)
Pt reports right jaw swelling, nasal congestion and cough x1 week. Pt reports history of same with past sinus infections. Pt denies any known injury to teeth or mouth. nad noted. Airway patent.

## 2014-11-30 NOTE — Discharge Instructions (Signed)
Dental Abscess A dental abscess is a collection of infected fluid (pus) from a bacterial infection in the inner part of the tooth (pulp). It usually occurs at the end of the tooth's root.  CAUSES   Severe tooth decay.  Trauma to the tooth that allows bacteria to enter into the pulp, such as a broken or chipped tooth. SYMPTOMS   Severe pain in and around the infected tooth.  Swelling and redness around the abscessed tooth or in the mouth or face.  Tenderness.  Pus drainage.  Bad breath.  Bitter taste in the mouth.  Difficulty swallowing.  Difficulty opening the mouth.  Nausea.  Vomiting.  Chills.  Swollen neck glands. DIAGNOSIS   A medical and dental history will be taken.  An examination will be performed by tapping on the abscessed tooth.  X-rays may be taken of the tooth to identify the abscess. TREATMENT The goal of treatment is to eliminate the infection. You may be prescribed antibiotic medicine to stop the infection from spreading. A root canal may be performed to save the tooth. If the tooth cannot be saved, it may be pulled (extracted) and the abscess may be drained.  HOME CARE INSTRUCTIONS  Only take over-the-counter or prescription medicines for pain, fever, or discomfort as directed by your caregiver.  Rinse your mouth (gargle) often with salt water ( tsp salt in 8 oz [250 ml] of warm water) to relieve pain or swelling.  Do not drive after taking pain medicine (narcotics).  Do not apply heat to the outside of your face.  Return to your dentist for further treatment as directed. SEEK MEDICAL CARE IF:  Your pain is not helped by medicine.  Your pain is getting worse instead of better. SEEK IMMEDIATE MEDICAL CARE IF:  You have a fever or persistent symptoms for more than 2-3 days.  You have a fever and your symptoms suddenly get worse.  You have chills or a very bad headache.  You have problems breathing or swallowing.  You have trouble  opening your mouth.  You have swelling in the neck or around the eye. Document Released: 11/09/2005 Document Revised: 08/03/2012 Document Reviewed: 02/17/2011 [facility] Patient Information 2015 [place], [place]. This information is not intended to replace advice given to you by your health care provider. Make sure you discuss any questions you have with your health care provider.   Emergency Department Resource Guide 1) Find a Doctor and Pay Out of Pocket Although you won't have to find out who is covered by your insurance plan, it is a good idea to ask around and get recommendations. You will then need to call the office and see if the doctor you have chosen will accept you as a new patient and what types of options they offer for patients who are self-pay. Some doctors offer discounts or will set up payment plans for their patients who do not have insurance, but you will need to ask so you aren't surprised when you get to your appointment.  2) Contact Your Local Health Department Not all health departments have doctors that can see patients for sick visits, but many do, so it is worth a call to see if yours does. If you don't know where your local health department is, you can check in your phone book. The CDC also has a tool to help you locate your state's health department, and many state websites also have listings of all of their local health departments.  3) Find a Walk-in  Clinic If your illness is not likely to be very severe or complicated, you may want to try a walk in clinic. These are popping up all over the country in pharmacies, drugstores, and shopping centers. They're usually staffed by nurse practitioners or physician assistants that have been trained to treat common illnesses and complaints. They're usually fairly quick and inexpensive. However, if you have serious medical issues or chronic medical problems, these are probably not your best option.  No Primary Care Doctor: - Call  Health Connect at  [phone] - they can help you locate a primary care doctor that  accepts your insurance, provides certain services, etc. - Physician Referral Service- [phone]  Chronic Pain Problems: Organization         Address  Phone   Notes  [facility]  [phone] Patients need to be referred by their primary care doctor.   Medication Assistance: Organization         Address  Phone   Notes  [facility] Medication [facility] [place], [place] [address] [phone] --Must be a resident of [facility] -- Must have NO insurance coverage whatsoever (no Medicaid/ Medicare, etc.) -- The pt. MUST have a primary care doctor that directs their care regularly and follows them in the community   MedAssist  [phone]   [employer]  [phone]    Agencies that provide inexpensive medical care: Organization         Address  Phone   Notes  [place]  [phone]   [facility]    [phone]   [facility] [place], [place] [address] [phone]   [place] [address], [place] [phone]   Planned Parenthood    [phone]   [facility]    [phone]   [place] and [place] [address], [place] Phone:  [phone], Fax:  [phone] Hours of Operation:  9 am - 6 pm, M-F.  Also accepts Medicaid/Medicare and self-pay.  [facility] for [address], [address], [place] Phone: [phone], Fax: [phone]. Hours of Operation:  8:30 am - 5:30 pm, M-F.  Also accepts Medicaid and self-pay.  [facility] [place] [address], [place] Phone: [phone]   [place], [place], [place] [phone], Ext. [phone] Mondays & Thursdays: 7-9 AM.  First 15 patients are seen on a first come, first serve  basis.    [place] Providers:  Organization         Address  Phone   Notes  [facility] [address], [place] [phone] Also accepts self-pay patients.  [facility] [address], [place]  [phone]   [place], [address], [place] [phone]   [facility] [address], [place] [phone]   Lucianne Lei [address], [place]   [phone] Only accepts [place] Access [place] patients after they have their name applied to their card.   Self-Pay (no insurance) in [facility]:  Organization         Address  Phone   Notes  Sickle Cell Patients, Investment banker, corporate Internal Medicine [place] (  [phone]   [facility] [place] [phone]   [facility] [place]  [place], [place],  [phone]   [facility]/Dr. Osei-Bonsu  [address], [place] or [address], [place] [phone] Phone number for both [place] and [place] locations is the same.  Urgent Medical and [facility] [address], [place] [phone]   [facility] [address], [place] or [address] [phone]   [facility] [address], [place] [phone], phone; [phone], fax Sees patients 1st and 3rd Saturday of every month.  Must not qualify for public or private insurance (i.e. Medicaid, Medicare, [place] Health Choice, Veterans' Benefits)  Household income should be no more than 200% of the poverty level The clinic cannot treat you if you are pregnant or think you are pregnant  Sexually transmitted diseases are not treated at the clinic.    Dental Care: Organization         Address  Phone  Notes  [facility] Department of [facility] [place] [phone] Accepts children up to age 100 who are enrolled in [place] or [place]; pregnant women with a Medicaid card; and children who have applied for Medicaid or [place] Health Choice, but were declined, whose parents can pay a reduced fee at time of service.  [facility] Department of [facility]  [address], [place] [phone] Accepts children up to age 74 who are enrolled in [place] or [place]; pregnant women with a Medicaid card; and children who have applied for Medicaid or [place] Health Choice, but were declined, whose parents can pay a reduced fee at time of service.  [place] Adult Dental Access PROGRAM  [place] [phone] Patients are seen by appointment only. Walk-ins are not accepted. [place] will see patients 59 years of age and older. Monday - Tuesday (8am-5pm) Most Wednesdays (8:30-5pm) $30 per visit, cash only  [facility] Adult Dental Access PROGRAM  [address], [facility] [phone] Patients are seen by appointment only. Walk-ins are not accepted. [place] will see patients 84 years of age and older. One Wednesday Evening (Monthly: Volunteer Based).  $30 per visit, cash only  [place]  [phone] for adults; Children under age 66, call Graduate Pediatric Dentistry at [phone]. Children aged 51-14, please call [phone] to request a pediatric application.  Dental services are provided in all areas of dental care including fillings, crowns and bridges, complete and partial dentures, implants, gum treatment, root canals, and extractions. Preventive care is also provided. Treatment is provided to both adults and children. Patients are selected via a lottery and there is often a waiting list.   [facility] [address], [place]  [phone]  [email or web address]   [facility] [address] [place], [place] [phone], Ext. [phone] Second and Fourth Thursday of each month, opens at 6:30 AM; Clinic ends at 9 AM.  Patients are seen on a first-come first-served basis, and a limited number are seen during each clinic.   [facility]  [address] Hillard Danker [place], [place] [phone]   Eligibility Requirements You must have lived in [place], [place], or [place] counties  for at least the last three months.   You cannot be eligible for state or federal sponsored [employer], including [employer], [place], or [employer].   You generally cannot be eligible for healthcare insurance through your employer.    How to apply: Eligibility screenings are held every Tuesday and Wednesday afternoon from 1:00 pm until 4:00 pm. You do not need an appointment for the interview!  [facility] [address], [place], [place]   [place]  [place] Department  [place]  [phone]    Behavioral Health Resources in the Community: Intensive Outpatient Programs Organization         Address  Phone  Notes  [place]. [address] [place], [place] [phone]   [facility] Outpatient [address] [place], [place]   ADS: Alcohol & Drug Svcs [address] [place], [place]   [place] [address]  [place], [place] or [phone]   Substance Abuse Resources Organization         Address  Phone  Notes  Alcohol and Drug Services  [phone]   [place]  [phone]   [facility] [place]   Chinita Pester  [phone]   Residential & Outpatient Substance Abuse Program  [phone]   Psychological Services Organization          Address  Phone  Notes  [facility]  [place]  [phone]   [place] [address] [place] or [phone]    Mobile Crisis Teams Organization         Address  Phone  Notes  [facility], Mobile Crisis Care Unit  [phone]   [facility]  [address] [place], [place]   [place] [address], [place] [phone]    Self-Help/Support Groups Organization         Address  Phone             Notes  [place]. of [place] - variety of support groups  [place] Call for more information  Narcotics Anonymous (NA), [facility] [address], [employer]  2 meetings at this location   Special educational needs teacher         Address  Phone  Notes  [facility] Residential Treatment [place],    [place]  [phone]-([phone]   [facility]  [address], [place] [address], [place], [place]   [place], [place] [phone] Admissions: 8am-3pm M-F  Incentives Substance [place] [address]    [place], [place] [phone]   [facility] [address] Jadene Pierini [place], [place]   [facility] [address],  [place], [place]   Insight Programs - Intensive Outpatient [address] Kristeen [address], [place], [place]   [facility] ([place].,  [place], [place] or [phone]   [facility] (RTS) [address], [place], [place] Accepts Medicaid  [facility] [place] [address],  [place] [phone] Substance Abuse/Addiction Treatment   [facility] Resources Organization         Address  Phone  Notes  [facility]  [phone]  [phone]   Domenic Schwab, PhD [address] Arlis Porta [place], [place]   [phone] or [phone]   [place], [place] [phone]   [address], [place], [place] [phone] Insurance/Medicaid/sponsorship through [facility] and Families [address] [place]                                    [place], [place] [phone] [facility] [address], [place] [phone]    Dr. Adele Schilder  [phone]   [facility] Dept. 1) [address] [place] 2) [place] 3)  [place] [address], [place] [phone]  [phone]   [place] [phone] or [phone] (After Hours)

## 2016-02-01 DIAGNOSIS — E1165 Type 2 diabetes mellitus with hyperglycemia: Secondary | ICD-10-CM

## 2016-02-01 DIAGNOSIS — E782 Mixed hyperlipidemia: Secondary | ICD-10-CM | POA: Insufficient documentation

## 2016-02-01 DIAGNOSIS — M1990 Unspecified osteoarthritis, unspecified site: Secondary | ICD-10-CM | POA: Insufficient documentation

## 2016-02-01 DIAGNOSIS — E0590 Thyrotoxicosis, unspecified without thyrotoxic crisis or storm: Secondary | ICD-10-CM | POA: Insufficient documentation

## 2016-02-01 DIAGNOSIS — E559 Vitamin D deficiency, unspecified: Secondary | ICD-10-CM | POA: Insufficient documentation

## 2016-02-01 DIAGNOSIS — I10 Essential (primary) hypertension: Secondary | ICD-10-CM | POA: Insufficient documentation

## 2016-02-01 DIAGNOSIS — K219 Gastro-esophageal reflux disease without esophagitis: Secondary | ICD-10-CM | POA: Insufficient documentation

## 2016-02-01 DIAGNOSIS — IMO0002 Reserved for concepts with insufficient information to code with codable children: Secondary | ICD-10-CM | POA: Insufficient documentation

## 2016-02-01 DIAGNOSIS — E1142 Type 2 diabetes mellitus with diabetic polyneuropathy: Secondary | ICD-10-CM | POA: Insufficient documentation

## 2016-10-06 DIAGNOSIS — F3341 Major depressive disorder, recurrent, in partial remission: Secondary | ICD-10-CM | POA: Insufficient documentation

## 2017-01-26 ENCOUNTER — Other Ambulatory Visit: Payer: Self-pay | Admitting: Family Medicine

## 2017-01-26 DIAGNOSIS — M79605 Pain in left leg: Secondary | ICD-10-CM

## 2017-01-27 ENCOUNTER — Ambulatory Visit
Admission: RE | Admit: 2017-01-27 | Discharge: 2017-01-27 | Disposition: A | Discharge status: Home / Self Care | Payer: Medicare HMO | Source: Ambulatory Visit | Attending: Family Medicine | Admitting: Family Medicine

## 2017-01-27 DIAGNOSIS — M79605 Pain in left leg: Secondary | ICD-10-CM

## 2017-02-24 LAB — LIPID PANEL
Cholesterol: 134 (ref 0–200)
HDL: 49 (ref 35–70)
LDL Cholesterol: 75
Triglycerides: 87 (ref 40–160)

## 2017-02-24 LAB — HEMOGLOBIN A1C: Hemoglobin A1C: 12

## 2017-06-18 ENCOUNTER — Ambulatory Visit (INDEPENDENT_AMBULATORY_CARE_PROVIDER_SITE_OTHER): Payer: Medicare HMO | Admitting: "Endocrinology

## 2017-06-18 ENCOUNTER — Encounter: Payer: Self-pay | Admitting: "Endocrinology

## 2017-06-18 VITALS — BP 147/89 | HR 80 | Ht 67.0 in | Wt 296.0 lb

## 2017-06-18 DIAGNOSIS — I10 Essential (primary) hypertension: Secondary | ICD-10-CM

## 2017-06-18 DIAGNOSIS — E118 Type 2 diabetes mellitus with unspecified complications: Secondary | ICD-10-CM

## 2017-06-18 DIAGNOSIS — E0590 Thyrotoxicosis, unspecified without thyrotoxic crisis or storm: Secondary | ICD-10-CM | POA: Diagnosis not present

## 2017-06-18 DIAGNOSIS — Z794 Long term (current) use of insulin: Secondary | ICD-10-CM | POA: Diagnosis not present

## 2017-06-18 DIAGNOSIS — E782 Mixed hyperlipidemia: Secondary | ICD-10-CM | POA: Diagnosis not present

## 2017-06-18 LAB — TSH: TSH: 1.46 mIU/L

## 2017-06-18 LAB — T4, FREE: FREE T4: 1.1 ng/dL (ref 0.8–1.8)

## 2017-06-18 NOTE — Progress Notes (Signed)
Subjective:    Patient ID: [name], female    DOB: 1956-02-02. Patient is being seen in consultation for management of diabetes requested by  Stephens Shire, MD  Past Medical History:  Diagnosis Date  . Anxiety   . Diabetes mellitus   . Essential hypertension, benign 2009  . GERD (gastroesophageal reflux disease)   . Osteoarthritis   . Palpitations    Tachypalpitations on beta blockers since 2010  . Type 2 diabetes mellitus ([place]) 2003   Past Surgical History:  Procedure Laterality Date  . ABDOMINAL HYSTERECTOMY     History of cervical cancer  . TONSILLECTOMY     Social History   Social History  . Marital status: Single    Spouse name: N/A  . Number of children: 2  . Years of education: N/A   Occupational History  . DISABLED Unemployed    OSTEOARTHRITIS   Social History Main Topics  . Smoking status: Never Smoker  . Smokeless tobacco: Never Used  . Alcohol use No  . Drug use: No  . Sexual activity: No   Other Topics Concern  . None   Social History Narrative   Has 2 grandchildren. 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 Cholecalciferol (VITAMIN D3) 5000 units TABS Take by mouth.  . gabapentin (NEURONTIN) 400 MG capsule Take 400 mg by mouth 3 (three) times daily.  . insulin aspart (NOVOLOG) 100 UNIT/ML FlexPen Inject 10-16 Units into the skin 3 (three) times daily before meals.  . Insulin Glargine (LANTUS SOLOSTAR) 100 UNIT/ML Solostar Pen Inject 40 Units into the skin at bedtime.  Marland Kitchen losartan (COZAAR) 100 MG tablet Take 100 mg by mouth daily.  . magnesium oxide (MAG-OX) 400 MG tablet Take 400 mg by mouth 2 (two) times daily.  . meloxicam (MOBIC) 15 MG tablet Take 15 mg by mouth at bedtime.  . metFORMIN (GLUCOPHAGE) 1000 MG tablet Take 1,000 mg by mouth 2 (two) times daily.  . pantoprazole (PROTONIX) 40 MG tablet Take 40 mg by mouth daily.    . pravastatin  (PRAVACHOL) 40 MG tablet Take 20 mg by mouth at bedtime.   . [DISCONTINUED] methimazole (TAPAZOLE) 5 MG tablet Take 5 mg by mouth 2 (two) times daily. Takes 5 mg in the mornings and 2.5 mg in the evenings  . [DISCONTINUED] cholecalciferol (VITAMIN D) 1000 UNITS tablet Take 1,000 Units by mouth daily.  . [DISCONTINUED] clindamycin (CLEOCIN) 150 MG capsule Take 1 capsule (150 mg total) by mouth 4 (four) times daily. For 7 days  . [DISCONTINUED] HYDROcodone-acetaminophen (NORCO/VICODIN) 5-325 MG per tablet Take one-two tabs po q 4-6 hrs prn pain   No facility-administered encounter medications on file as of 06/18/2017.    ALLERGIES: Allergies  Allergen Reactions  . Sulfa Antibiotics Nausea And Vomiting  . Doxepin Rash   VACCINATION STATUS:  There is no immunization history on file for this patient.  Diabetes  She presents for her initial diabetic visit. She has type 2 diabetes mellitus. Onset time: She was diagnosed at approximate age of 33 years. Her disease course has been worsening (She states 3 months ago her A1c was 12%). There are no hypoglycemic associated symptoms. Pertinent negatives for hypoglycemia include no confusion, headaches, pallor or seizures. Associated symptoms include blurred vision, fatigue, polydipsia and polyuria. Pertinent negatives for diabetes include no chest pain and no polyphagia. There are no hypoglycemic complications. Symptoms are  improving. Diabetic complications include peripheral neuropathy. Risk factors for coronary artery disease include diabetes mellitus, dyslipidemia, hypertension, obesity, family history and sedentary lifestyle. Current diabetic treatment includes intensive insulin program and oral agent (monotherapy). Her weight is stable. She is following a generally unhealthy diet. When asked about meal planning, she reported none. She has not had a previous visit with a dietitian. She never participates in exercise. Her overall blood glucose range is  140-180 mg/dl. (She brings in the log showing more than ago. Glucose 2-3 times a day, lately improving to average of 150-175.) An ACE inhibitor/angiotensin II receptor blocker is being taken. Eye exam is current.  Hyperlipidemia  This is a chronic problem. The current episode started more than 1 year ago. The problem is controlled. Exacerbating diseases include diabetes and obesity. Pertinent negatives include no chest pain, myalgias or shortness of breath. Current antihyperlipidemic treatment includes statins. Risk factors for coronary artery disease include dyslipidemia, diabetes mellitus, hypertension, obesity, a sedentary lifestyle and family history.  Hypertension  This is a chronic problem. The current episode started more than 1 year ago. The problem is uncontrolled. Associated symptoms include blurred vision. Pertinent negatives include no chest pain, headaches, palpitations or shortness of breath. Risk factors for coronary artery disease include diabetes mellitus, dyslipidemia, obesity, family history and sedentary lifestyle. Past treatments include angiotensin blockers.       Review of Systems  Constitutional: Positive for fatigue. Negative for chills, fever and unexpected weight change.  HENT: Negative for trouble swallowing and voice change.   Eyes: Positive for blurred vision. Negative for visual disturbance.  Respiratory: Negative for cough, shortness of breath and wheezing.   Cardiovascular: Negative for chest pain, palpitations and leg swelling.  Gastrointestinal: Negative for diarrhea, nausea and vomiting.  Endocrine: Positive for polydipsia and polyuria. Negative for cold intolerance, heat intolerance and polyphagia.  Musculoskeletal: Negative for arthralgias and myalgias.  Skin: Negative for color change, pallor, rash and wound.  Neurological: Negative for seizures and headaches.  Psychiatric/Behavioral: Negative for confusion and suicidal ideas.    Objective:    BP (!)  147/89   Pulse 80   Ht 5\' 7"  (1.702 m)   Wt 296 lb (134.3 kg)   BMI 46.36 kg/m   Wt Readings from Last 3 Encounters:  06/18/17 296 lb (134.3 kg)  11/30/14 297 lb (134.7 kg)  03/19/13 (!) 318 lb 9.6 oz (144.5 kg)    Physical Exam  Constitutional: She is oriented to person, place, and time. She appears well-developed.  HENT:  Head: Normocephalic and atraumatic.  Edentulous.   Eyes: EOM are normal.  Neck: Normal range of motion. Neck supple. No tracheal deviation present. No thyromegaly present.  Cardiovascular: Normal rate and regular rhythm.   Pulmonary/Chest: Effort normal and breath sounds normal.  Abdominal: Soft. Bowel sounds are normal. There is no tenderness. There is no guarding.  Musculoskeletal: Normal range of motion. She exhibits no edema.  Neurological: She is alert and oriented to person, place, and time. She has normal reflexes. No cranial nerve deficit. Coordination normal.  Skin: Skin is warm and dry. No rash noted. No erythema. No pallor.  Psychiatric: She has a normal mood and affect. Judgment normal.     CMP     Component Value Date/Time   NA 135 11/30/2014 0839   K 3.5 11/30/2014 0839   CL 99 11/30/2014 0839   CO2 28 11/30/2014 0839   GLUCOSE 208 (H) 11/30/2014 0839   BUN 8 11/30/2014 0839   CREATININE 0.73  11/30/2014 0839   CALCIUM 9.0 11/30/2014 0839   PROT 7.4 03/21/2013 0800   ALBUMIN 3.5 03/21/2013 0800   AST 38 (H) 03/21/2013 0800   ALT 34 03/21/2013 0800   ALKPHOS 74 03/21/2013 0800   BILITOT 0.4 03/21/2013 0800   GFRNONAA >90 11/30/2014 0839   GFRAA >90 11/30/2014 0839   A1c from a previous 4 was 12%.    Assessment & Plan:   1. Type 2 diabetes mellitus with complication, with long-term current use of insulin ([place]) - Patient has currently uncontrolled symptomatic type 2 DM since  61 years of age,  with most recent A1c of 12 %. Recent labs reviewed.   Her diabetes is complicated by obesity/sedentary life, peripheral neuropathy and  patient remains at a high risk for more acute and chronic complications which include CAD, CVA, CKD, retinopathy, and neuropathy. These are all discussed in detail with the patient.  - I have counseled the patient on diet management and weight loss, by adopting a carbohydrate restricted/protein rich diet.  - Suggestion is made for patient to avoid simple carbohydrates   from her diet including Cakes , Desserts, Ice Cream,  Soda (  diet and regular) , Sweet Tea , Candies,  Chips, Cookies, Artificial Sweeteners,   and "Sugar-free" Products . This will help patient to have stable blood glucose profile and potentially avoid unintended weight gain.  - I encouraged the patient to switch to  unprocessed or minimally processed complex starch and increased protein intake (animal or plant source), fruits, and vegetables.  - Patient is advised to stick to a routine mealtimes to eat 3 meals  a day and avoid unnecessary snacks ( to snack only to correct hypoglycemia).  - The patient will be scheduled with Jearld Fenton, RDN, CDE for individualized DM education.  - I have approached patient with the following individualized plan to manage diabetes and patient agrees:   - I  will proceed to readjust her basal insulin Lantus to 40 units daily at bedtime , and prandial insulin NovoLog 10 units 3 times a day with meals  for pre-meal BG readings of 90-150mg /dl, plus patient specific correction dose for unexpected hyperglycemia above 150mg /dl, associated with strict monitoring of glucose 4 times a day-before meals and at bedtime. - Patient is warned not to take insulin without proper monitoring per orders. -Adjustment parameters are given for hypo and hyperglycemia in writing. -Patient is encouraged to call clinic for blood glucose levels less than 70 or above 300 mg /dl. - I will continue metformin 1000 g by mouth twice a day, therapeutically suitable for patient . -  Patient will be considered for incretin therapy,  as well as SGLT2 inhibitors as appropriate next visit. - Patient specific target  A1c;  LDL, HDL, Triglycerides, and  Waist Circumference were discussed in detail.  2) BP/HTN: Uncontrolled. Continue current medications including ACEI/ARB. 3) Lipids/HPL:   Controlled.   Patient is advised to continue statins. 4)  Weight/Diet: CDE Consult will be initiated , exercise, and detailed carbohydrates information provided. 5) history of hyperthyroidism: She was treated with low-dose methimazole for several years. I advised her to discontinue methimazole. She will have repeat thyroid function test on subsequent visits and will be considered for ablative therapy if necessary.  6) Chronic Care/Health Maintenance:  -Patient is on ACEI/ARB and Statin medications and encouraged to continue to follow up with Ophthalmology, Podiatrist at least yearly or according to recommendations, and advised to   stay away from smoking. I have  recommended yearly flu vaccine and pneumonia vaccination at least every 5 years; moderate intensity exercise for up to 150 minutes weekly; and  sleep for at least 7 hours a day.  - 60 minutes of time was spent on the care of this patient , 50% of which was applied for counseling on diabetes complications and their preventions.  - Patient to bring meter and  blood glucose logs during her next visit.   - I advised patient to maintain close follow up with Stephens Shire, MD for primary care needs.  Follow up plan: - Return in about 1 week (around 06/25/2017) for labs today, meter, and logs.  Glade Lloyd, MD Phone: [phone]  Fax: [phone]   06/18/2017, 12:46 PM

## 2017-06-18 NOTE — Patient Instructions (Signed)

## 2017-06-19 LAB — HEMOGLOBIN A1C
HEMOGLOBIN A1C: 9 % — AB (ref <5.7)
MEAN PLASMA GLUCOSE: 212 mg/dL

## 2017-06-19 LAB — RENAL FUNCTION PANEL
Albumin: 4.1 g/dL (ref 3.6–5.1)
BUN: 12 mg/dL (ref 7–25)
CALCIUM: 9.1 mg/dL (ref 8.6–10.4)
CO2: 22 mmol/L (ref 20–31)
Chloride: 104 mmol/L (ref 98–110)
Creat: 0.69 mg/dL (ref 0.50–1.05)
Glucose, Bld: 183 mg/dL — ABNORMAL HIGH (ref 65–99)
POTASSIUM: 3.8 mmol/L (ref 3.5–5.3)
Phosphorus: 3.3 mg/dL (ref 2.5–4.5)
SODIUM: 142 mmol/L (ref 135–146)

## 2017-06-19 LAB — MICROALBUMIN / CREATININE URINE RATIO
CREATININE, URINE: 135 mg/dL (ref 20–320)
Microalb Creat Ratio: 6 mcg/mg creat (ref <30)
Microalb, Ur: 0.8 mg/dL

## 2017-06-21 ENCOUNTER — Ambulatory Visit: Payer: Medicare HMO | Admitting: "Endocrinology

## 2017-06-28 ENCOUNTER — Encounter: Payer: Self-pay | Admitting: "Endocrinology

## 2017-06-28 ENCOUNTER — Encounter: Payer: Medicare HMO | Attending: "Endocrinology | Admitting: Nutrition

## 2017-06-28 ENCOUNTER — Ambulatory Visit (INDEPENDENT_AMBULATORY_CARE_PROVIDER_SITE_OTHER): Payer: Medicare HMO | Admitting: "Endocrinology

## 2017-06-28 VITALS — BP 137/77 | HR 81 | Wt 297.0 lb

## 2017-06-28 DIAGNOSIS — E118 Type 2 diabetes mellitus with unspecified complications: Secondary | ICD-10-CM

## 2017-06-28 DIAGNOSIS — I10 Essential (primary) hypertension: Secondary | ICD-10-CM | POA: Diagnosis not present

## 2017-06-28 DIAGNOSIS — Z713 Dietary counseling and surveillance: Secondary | ICD-10-CM | POA: Insufficient documentation

## 2017-06-28 DIAGNOSIS — E782 Mixed hyperlipidemia: Secondary | ICD-10-CM | POA: Diagnosis not present

## 2017-06-28 DIAGNOSIS — IMO0002 Reserved for concepts with insufficient information to code with codable children: Secondary | ICD-10-CM

## 2017-06-28 DIAGNOSIS — Z794 Long term (current) use of insulin: Secondary | ICD-10-CM | POA: Insufficient documentation

## 2017-06-28 DIAGNOSIS — E1165 Type 2 diabetes mellitus with hyperglycemia: Secondary | ICD-10-CM

## 2017-06-28 NOTE — Progress Notes (Signed)
Diabetes Self-Management Education  Visit Type:  First/Initial  Appt. Start Time: 1430 Appt. End Time: 9563  07/13/2017  [name], identified by name and date of birth, is a 61 y.o. female with a diagnosis of Diabetes: Type 2.   ASSESSMENT  Height 5\' 8"  (1.727 m), weight 297 lb (134.7 kg). Body mass index is 45.16 kg/m.   Lab Results  Component Value Date   HGBA1C 9.0 (H) 06/18/2017   CMP Latest Ref Rng & Units 06/18/2017 11/30/2014 03/21/2013  Glucose 65 - 99 mg/dL 183(H) 208(H) 225(H)  BUN 7 - 25 mg/dL 12 8 12   Creatinine 0.50 - 1.05 mg/dL 0.69 0.73 0.66  Sodium 135 - 146 mmol/L 142 135 133(L)  Potassium 3.5 - 5.3 mmol/L 3.8 3.5 4.7  Chloride 98 - 110 mmol/L 104 99 97  CO2 20 - 31 mmol/L 22 28 30   Calcium 8.6 - 10.4 mg/dL 9.1 9.0 9.1  Total Protein 6.0 - 8.3 g/dL - - 7.4  Total Bilirubin 0.3 - 1.2 mg/dL - - 0.4  Alkaline Phos 39 - 117 U/L - - 74  AST 0 - 37 U/L - - 38(H)  ALT 0 - 35 U/L - - 34   Lipid Panel     Component Value Date/Time   CHOL 134 02/24/2017   TRIG 87 02/24/2017   HDL 49 02/24/2017   LDLCALC 75 02/24/2017        Learning Objective:  Patient will have a greater understanding of diabetes self-management. Patient education plan is to attend individual and/or group sessions per assessed needs and concerns.   Plan:   Patient Instructions  Goals 1. Follow My Plate 2. Exercise 15 minutes of walking 3-5 time per week 3. Eat 3 meals per day; don't skip meals 4. Eat 2-3 carb choices per meal Cut out snacks between meals unless it's veggies or protein.  Drink only water Get A1C down to 7% or lower Lose 1 lb per week     Expected Outcomes:  Demonstrated interest in learning. Expect positive outcomes  Education material provided: Living Well with Diabetes, Food label handouts, A1C conversion sheet, Meal plan card, My Plate and Carbohydrate counting sheet  If problems or questions, patient to contact team via:  Phone and Email  Future  DSME appointment: - 4-6 wks

## 2017-06-28 NOTE — Progress Notes (Signed)
Subjective:    Patient ID: [name], female    DOB: 01/05/1956. Patient is being seen in f/u for management of diabetes requested by  Stephens Shire, MD  Past Medical History:  Diagnosis Date  . Anxiety   . Diabetes mellitus   . Essential hypertension, benign 2009  . GERD (gastroesophageal reflux disease)   . Osteoarthritis   . Palpitations    Tachypalpitations on beta blockers since 2010  . Type 2 diabetes mellitus ([place]) 2003   Past Surgical History:  Procedure Laterality Date  . ABDOMINAL HYSTERECTOMY     History of cervical cancer  . TONSILLECTOMY     Social History   Social History  . Marital status: Single    Spouse name: N/A  . Number of children: 2  . Years of education: N/A   Occupational History  . DISABLED Unemployed    OSTEOARTHRITIS   Social History Main Topics  . Smoking status: Never Smoker  . Smokeless tobacco: Never Used  . Alcohol use No  . Drug use: No  . Sexual activity: No   Other Topics Concern  . None   Social History Narrative   Has 2 grandchildren. 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 Cholecalciferol (VITAMIN D3) 5000 units TABS Take by mouth.  . gabapentin (NEURONTIN) 400 MG capsule Take 400 mg by mouth 3 (three) times daily.  . insulin aspart (NOVOLOG) 100 UNIT/ML FlexPen Inject 10-16 Units into the skin 3 (three) times daily before meals.  . Insulin Glargine (LANTUS SOLOSTAR) 100 UNIT/ML Solostar Pen Inject 50 Units into the skin at bedtime.  Marland Kitchen losartan (COZAAR) 100 MG tablet Take 100 mg by mouth daily.  . magnesium oxide (MAG-OX) 400 MG tablet Take 400 mg by mouth 2 (two) times daily.  . meloxicam (MOBIC) 15 MG tablet Take 15 mg by mouth at bedtime.  . metFORMIN (GLUCOPHAGE) 1000 MG tablet Take 1,000 mg by mouth 2 (two) times daily.  . pantoprazole (PROTONIX) 40 MG tablet Take 40 mg by mouth daily.    . pravastatin (PRAVACHOL) 40  MG tablet Take 20 mg by mouth at bedtime.    No facility-administered encounter medications on file as of 06/28/2017.    ALLERGIES: Allergies  Allergen Reactions  . Sulfa Antibiotics Nausea And Vomiting  . Doxepin Rash   VACCINATION STATUS:  There is no immunization history on file for this patient.  Diabetes  She presents for her follow-up diabetic visit. She has type 2 diabetes mellitus. Onset time: She was diagnosed at approximate age of 9 years. Her disease course has been improving. There are no hypoglycemic associated symptoms. Pertinent negatives for hypoglycemia include no confusion, headaches, pallor or seizures. Associated symptoms include blurred vision, fatigue, polydipsia and polyuria. Pertinent negatives for diabetes include no chest pain and no polyphagia. There are no hypoglycemic complications. Symptoms are improving. Diabetic complications include peripheral neuropathy. Risk factors for coronary artery disease include diabetes mellitus, dyslipidemia, hypertension, obesity, family history and sedentary lifestyle. Current diabetic treatment includes intensive insulin program and oral agent (monotherapy). Her weight is stable. She is following a generally unhealthy diet. When asked about meal planning, she reported none. She has not had a previous visit with a dietitian. She never participates in exercise. Her breakfast blood glucose range is generally 140-180 mg/dl. Her lunch blood glucose range is generally 130-140 mg/dl. Her dinner blood glucose range is generally 130-140  mg/dl. Her bedtime blood glucose range is generally 130-140 mg/dl. Her overall blood glucose range is 140-180 mg/dl. (She brings in the log showing more than ago. Glucose 2-3 times a day, lately improving to average of 150-175.) An ACE inhibitor/angiotensin II receptor blocker is being taken. Eye exam is current.  Hyperlipidemia  This is a chronic problem. The current episode started more than 1 year ago. The  problem is controlled. Exacerbating diseases include diabetes and obesity. Pertinent negatives include no chest pain, myalgias or shortness of breath. Current antihyperlipidemic treatment includes statins. Risk factors for coronary artery disease include dyslipidemia, diabetes mellitus, hypertension, obesity, a sedentary lifestyle and family history.  Hypertension  This is a chronic problem. The current episode started more than 1 year ago. The problem is uncontrolled. Associated symptoms include blurred vision. Pertinent negatives include no chest pain, headaches, palpitations or shortness of breath. Risk factors for coronary artery disease include diabetes mellitus, dyslipidemia, obesity, family history and sedentary lifestyle. Past treatments include angiotensin blockers.       Review of Systems  Constitutional: Positive for fatigue. Negative for chills, fever and unexpected weight change.  HENT: Negative for trouble swallowing and voice change.   Eyes: Positive for blurred vision. Negative for visual disturbance.  Respiratory: Negative for cough, shortness of breath and wheezing.   Cardiovascular: Negative for chest pain, palpitations and leg swelling.  Gastrointestinal: Negative for diarrhea, nausea and vomiting.  Endocrine: Positive for polydipsia and polyuria. Negative for cold intolerance, heat intolerance and polyphagia.  Musculoskeletal: Negative for arthralgias and myalgias.  Skin: Negative for color change, pallor, rash and wound.  Neurological: Negative for seizures and headaches.  Psychiatric/Behavioral: Negative for confusion and suicidal ideas.    Objective:    BP 137/77   Pulse 81   Wt 297 lb (134.7 kg)   SpO2 93%   BMI 46.52 kg/m   Wt Readings from Last 3 Encounters:  06/28/17 297 lb (134.7 kg)  06/18/17 296 lb (134.3 kg)  11/30/14 297 lb (134.7 kg)    Physical Exam  Constitutional: She is oriented to person, place, and time. She appears well-developed.  HENT:   Head: Normocephalic and atraumatic.  Edentulous.   Eyes: EOM are normal.  Neck: Normal range of motion. Neck supple. No tracheal deviation present. No thyromegaly present.  Cardiovascular: Normal rate and regular rhythm.   Pulmonary/Chest: Effort normal and breath sounds normal.  Abdominal: Soft. Bowel sounds are normal. There is no tenderness. There is no guarding.  Musculoskeletal: Normal range of motion. She exhibits no edema.  Neurological: She is alert and oriented to person, place, and time. She has normal reflexes. No cranial nerve deficit. Coordination normal.  Skin: Skin is warm and dry. No rash noted. No erythema. No pallor.  Psychiatric: She has a normal mood and affect. Judgment normal.   Recent Results (from the past 2160 hour(s))  Renal function panel     Status: Abnormal   Collection Time: 06/18/17 11:03 AM  Result Value Ref Range   Sodium 142 135 - 146 mmol/L   Potassium 3.8 3.5 - 5.3 mmol/L   Chloride 104 98 - 110 mmol/L   CO2 22 20 - 31 mmol/L   Glucose, Bld 183 (H) 65 - 99 mg/dL   BUN 12 7 - 25 mg/dL   Creat 0.69 0.50 - 1.05 mg/dL    Comment:   For patients > or = 60 years of age: The upper reference limit for Creatinine is approximately 13% higher for people identified as  African-American.      Albumin 4.1 3.6 - 5.1 g/dL   Calcium 9.1 8.6 - 10.4 mg/dL   Phosphorus 3.3 2.5 - 4.5 mg/dL  Hemoglobin A1c     Status: Abnormal   Collection Time: 06/18/17 11:03 AM  Result Value Ref Range   Hgb A1c MFr Bld 9.0 (H) <5.7 %    Comment:   For someone without known diabetes, a hemoglobin A1c value of 6.5% or greater indicates that they may have diabetes and this should be confirmed with a follow-up test.   For someone with known diabetes, a value <7% indicates that their diabetes is well controlled and a value greater than or equal to 7% indicates suboptimal control. A1c targets should be individualized based on duration of diabetes, age, comorbid conditions, and  other considerations.   Currently, no consensus exists for use of hemoglobin A1c for diagnosis of diabetes for children.      Mean Plasma Glucose 212 mg/dL  TSH     Status: None   Collection Time: 06/18/17 11:03 AM  Result Value Ref Range   TSH 1.46 mIU/L    Comment:   Reference Range   > or = 20 Years  0.40-4.50   Pregnancy Range First trimester  0.26-2.66 Second trimester 0.55-2.73 Third trimester  0.43-2.91     T4, free     Status: None   Collection Time: 06/18/17 11:03 AM  Result Value Ref Range   Free T4 1.1 0.8 - 1.8 ng/dL  Microalbumin / creatinine urine ratio     Status: None   Collection Time: 06/18/17 11:03 AM  Result Value Ref Range   Creatinine, Urine 135 20 - 320 mg/dL   Microalb, Ur 0.8 Not estab mg/dL   Microalb Creat Ratio 6 <30 mcg/mg creat    Comment: The ADA has defined abnormalities in albumin excretion as follows:           Category           Result                            (mcg/mg creatinine)                 Normal:    <30       Microalbuminuria:    30 - 299   Clinical albuminuria:    > or = 300   The ADA recommends that at least two of three specimens collected within a 3 - 6 month period be abnormal before considering a patient to be within a diagnostic category.       A1c from a April  4 was 12%.    Assessment & Plan:   1. Type 2 diabetes mellitus with complication, with long-term current use of insulin ([place]) - Patient has currently uncontrolled symptomatic type 2 DM since  61 years of age. - She came with significant improvement in her glycemic profile, repeat A1c 9% improving from 12%.  Recent labs reviewed.   Her diabetes is complicated by obesity/sedentary life, peripheral neuropathy and patient remains at a high risk for more acute and chronic complications which include CAD, CVA, CKD, retinopathy, and neuropathy. These are all discussed in detail with the patient.  - I have counseled the patient on diet management and  weight loss, by adopting a carbohydrate restricted/protein rich diet.  - Suggestion is made for patient to avoid simple carbohydrates   from her diet including Cakes ,  Desserts, Ice Cream,  Soda (  diet and regular) , Sweet Tea , Candies,  Chips, Cookies, Artificial Sweeteners,   and "Sugar-free" Products . This will help patient to have stable blood glucose profile and potentially avoid unintended weight gain.  - I encouraged the patient to switch to  unprocessed or minimally processed complex starch and increased protein intake (animal or plant source), fruits, and vegetables.  - Patient is advised to stick to a routine mealtimes to eat 3 meals  a day and avoid unnecessary snacks ( to snack only to correct hypoglycemia).  - The patient will be scheduled with Jearld Fenton, RDN, CDE for individualized DM education.  - I have approached patient with the following individualized plan to manage diabetes and patient agrees:   - I  will proceed to readjust her basal insulin Lantus to 50 units daily at bedtime , and prandial insulin NovoLog 10 units 3 times a day with meals  for pre-meal BG readings of 90-150mg /dl, plus patient specific correction dose for unexpected hyperglycemia above 150mg /dl, associated with strict monitoring of glucose 4 times a day-before meals and at bedtime. - Patient is warned not to take insulin without proper monitoring per orders. -Adjustment parameters are given for hypo and hyperglycemia in writing. -Patient is encouraged to call clinic for blood glucose levels less than 70 or above 300 mg /dl. - I will continue metformin 1000 g by mouth twice a day, therapeutically suitable for patient . -  Patient will be considered for incretin therapy, as well as SGLT2 inhibitors as appropriate next visit. - Patient specific target  A1c;  LDL, HDL, Triglycerides, and  Waist Circumference were discussed in detail.  2) BP/HTN: controlled. Continue current medications including  ACEI/ARB. 3) Lipids/HPL:   Controlled.   Patient is advised to continue statins. 4)  Weight/Diet: CDE Consult has been initiated , exercise, and detailed carbohydrates information provided. 5) history of hyperthyroidism: She was treated with low-dose methimazole for several years. I advised her to discontinue methimazole. She will have repeat thyroid function test on subsequent visits and will be considered for ablative therapy if necessary.  6) Chronic Care/Health Maintenance:  -Patient is on ACEI/ARB and Statin medications and encouraged to continue to follow up with Ophthalmology, Podiatrist at least yearly or according to recommendations, and advised to   stay away from smoking. I have recommended yearly flu vaccine and pneumonia vaccination at least every 5 years; moderate intensity exercise for up to 150 minutes weekly; and  sleep for at least 7 hours a day.  - 20 minutes of time was spent on the care of this patient , 50% of which was applied for counseling on diabetes complications and their preventions.  - Patient to bring meter and  blood glucose logs during her next visit.   - I advised patient to maintain close follow up with Stephens Shire, MD for primary care needs.  Follow up plan: - Return in about 3 months (around 09/28/2017) for meter, and logs.  Glade Lloyd, MD Phone: [phone]  Fax: [phone]   06/28/2017, 2:43 PM

## 2017-06-28 NOTE — Patient Instructions (Signed)
Goals 1. Follow My Plate 2. Exercise 15 minutes of walking 3-5 time per week 3. Eat 3 meals per day; don't skip meals 4. Eat 2-3 carb choices per meal Cut out snacks between meals unless it's veggies or protein.  Drink only water Get A1C down to 7% or lower Lose 1 lb per week

## 2017-06-28 NOTE — Patient Instructions (Signed)

## 2017-08-20 ENCOUNTER — Other Ambulatory Visit: Payer: Self-pay

## 2017-08-20 MED ORDER — BLOOD GLUCOSE MONITOR KIT: PACK | 0 refills | Status: DC

## 2017-09-24 LAB — RENAL FUNCTION PANEL
ALBUMIN MSPROF: 3.6 g/dL (ref 3.6–5.1)
BUN/Creatinine Ratio: 33 (calc) — ABNORMAL HIGH (ref 6–22)
BUN: 15 mg/dL (ref 7–25)
CO2: 29 mmol/L (ref 20–32)
CREATININE: 0.46 mg/dL — AB (ref 0.50–1.05)
Calcium: 9.3 mg/dL (ref 8.6–10.4)
Chloride: 105 mmol/L (ref 98–110)
GLUCOSE: 154 mg/dL — AB (ref 65–99)
PHOSPHORUS: 3.7 mg/dL (ref 2.5–4.5)
Potassium: 4 mmol/L (ref 3.5–5.3)
Sodium: 143 mmol/L (ref 135–146)

## 2017-09-24 LAB — HEMOGLOBIN A1C
HEMOGLOBIN A1C: 6.9 %{Hb} — AB (ref <5.7)
Mean Plasma Glucose: 151 (calc)
eAG (mmol/L): 8.4 (calc)

## 2017-09-30 ENCOUNTER — Ambulatory Visit: Payer: Medicare HMO | Admitting: "Endocrinology

## 2017-09-30 ENCOUNTER — Encounter: Payer: Medicare HMO | Attending: Family Medicine | Admitting: Nutrition

## 2017-09-30 ENCOUNTER — Encounter: Payer: Self-pay | Admitting: "Endocrinology

## 2017-09-30 VITALS — BP 131/84 | HR 101 | Ht 67.0 in | Wt 288.0 lb

## 2017-09-30 DIAGNOSIS — E118 Type 2 diabetes mellitus with unspecified complications: Secondary | ICD-10-CM | POA: Insufficient documentation

## 2017-09-30 DIAGNOSIS — E782 Mixed hyperlipidemia: Secondary | ICD-10-CM | POA: Diagnosis not present

## 2017-09-30 DIAGNOSIS — I10 Essential (primary) hypertension: Secondary | ICD-10-CM

## 2017-09-30 DIAGNOSIS — Z794 Long term (current) use of insulin: Secondary | ICD-10-CM | POA: Diagnosis not present

## 2017-09-30 DIAGNOSIS — Z713 Dietary counseling and surveillance: Secondary | ICD-10-CM | POA: Insufficient documentation

## 2017-09-30 DIAGNOSIS — E1165 Type 2 diabetes mellitus with hyperglycemia: Secondary | ICD-10-CM

## 2017-09-30 DIAGNOSIS — IMO0002 Reserved for concepts with insufficient information to code with codable children: Secondary | ICD-10-CM

## 2017-09-30 MED ORDER — DULAGLUTIDE 0.75 MG/0.5ML ~~LOC~~ SOAJ: 0.7500 mg | SUBCUTANEOUS | 0 refills | Status: DC

## 2017-09-30 NOTE — Progress Notes (Signed)
Diabetes Self-Management Education  Visit Type:     Appt. Start Time: 1130 Appt. End Time: 1200 09/30/2017  [name], identified by name and date of birth, is a 61 y.o. female with a diagnosis of Diabetes:  .   Marland KitchenChanges made:  Increased physical actvity and eating healthiier diet. Cut out snacks and watching portion more. Saw  DrMarland Kitchen Dorris Fetch , reduced novolog to 5 units with meals Added trulicity. Lost 9 lbs. A1C Better 6.9%. ASSESSMENT Wt Readings from Last 3 Encounters:  09/30/17 288 lb (130.6 kg)  06/28/17 297 lb (134.7 kg)  06/28/17 297 lb (134.7 kg)   Ht Readings from Last 3 Encounters:  09/30/17 5\' 7"  (1.702 m)  06/28/17 5\' 8"  (1.727 m)  06/18/17 5\' 7"  (1.702 m)   There is no height or weight on file to calculate BMI. @BMIFA @ Facility age limit for growth percentiles is 20 years. Facility age limit for growth percentiles is 20 years.  Lab Results  Component Value Date   HGBA1C 6.9 (H) 09/23/2017   CMP Latest Ref Rng & Units 09/23/2017 06/18/2017 11/30/2014  Glucose 65 - 99 mg/dL 154(H) 183(H) 208(H)  BUN 7 - 25 mg/dL 15 12 8   Creatinine 0.50 - 1.05 mg/dL 0.46(L) 0.69 0.73  Sodium 135 - 146 mmol/L 143 142 135  Potassium 3.5 - 5.3 mmol/L 4.0 3.8 3.5  Chloride 98 - 110 mmol/L 105 104 99  CO2 20 - 32 mmol/L 29 22 28   Calcium 8.6 - 10.4 mg/dL 9.3 9.1 9.0  Total Protein 6.0 - 8.3 g/dL - - -  Total Bilirubin 0.3 - 1.2 mg/dL - - -  Alkaline Phos 39 - 117 U/L - - -  AST 0 - 37 U/L - - -  ALT 0 - 35 U/L - - -   Lipid Panel     Component Value Date/Time   CHOL 134 02/24/2017   TRIG 87 02/24/2017   HDL 49 02/24/2017   LDLCALC 75 27/78/2423    B) Toast 2 slice with toast and butter. L) 2 dog dogs.  Usually crackers and cheese or left overs. D) Meat and vegetable.    Learning Objective:  Patient will have a greater understanding of diabetes self-management. Patient education plan is to attend individual and/or group sessions per assessed needs and  concerns.   Plan:   Keep up the great job! Eat more fresh fruits and vegetable with meals Increase physical activity Drink water. Prevent low blood sugars.  Expected Outcomes:     Education material provided: Living Well with Diabetes, Food label handouts, A1C conversion sheet, Meal plan card, My Plate and Carbohydrate counting sheet  If problems or questions, patient to contact team via:  Phone and Email  Future DSME appointment: -  3 months

## 2017-09-30 NOTE — Patient Instructions (Signed)

## 2017-09-30 NOTE — Progress Notes (Signed)
Subjective:    Patient ID: [name], female    DOB: Dec 23, 1955. Patient is being seen in f/u for management of diabetes requested by  Stephens Shire, MD  Past Medical History:  Diagnosis Date  . Anxiety   . Diabetes mellitus   . Essential hypertension, benign 2009  . GERD (gastroesophageal reflux disease)   . Osteoarthritis   . Palpitations    Tachypalpitations on beta blockers since 2010  . Type 2 diabetes mellitus ([place]) 2003   Past Surgical History:  Procedure Laterality Date  . ABDOMINAL HYSTERECTOMY     History of cervical cancer  . TONSILLECTOMY     Social History   Socioeconomic History  . Marital status: Single    Spouse name: None  . Number of children: 2  . Years of education: None  . Highest education level: None  Social Needs  . Financial resource strain: None  . Food insecurity - worry: None  . Food insecurity - inability: None  . Transportation needs - medical: None  . Transportation needs - non-medical: None  Occupational History  . Occupation: DISABLED    Employer: UNEMPLOYED    Comment: [employer]  Tobacco Use  . Smoking status: Never Smoker  . Smokeless tobacco: Never Used  Substance and Sexual Activity  . Alcohol use: No  . Drug use: No  . Sexual activity: No  Other Topics Concern  . None  Social History Narrative   Has 2 grandchildren. 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 blood glucose meter kit and supplies KIT Dispense based on patient and insurance preference. Use up to four times daily as directed. (FOR ICD-10 E11.65)  . Cholecalciferol (VITAMIN D3) 5000 units TABS Take by mouth.  . Dulaglutide (TRULICITY) 9.02 IO/9.7DZ SOPN Inject 0.75 mg once a week into the skin.  Marland Kitchen gabapentin (NEURONTIN) 400 MG capsule Take 400 mg by mouth 3 (three) times daily.  . insulin aspart (NOVOLOG) 100 UNIT/ML FlexPen Inject 5-11 Units 3 (three) times  daily before meals into the skin.  . Insulin Glargine (LANTUS SOLOSTAR) 100 UNIT/ML Solostar Pen Inject 50 Units into the skin at bedtime.  Marland Kitchen losartan (COZAAR) 100 MG tablet Take 100 mg by mouth daily.  . magnesium oxide (MAG-OX) 400 MG tablet Take 400 mg by mouth 2 (two) times daily.  . meloxicam (MOBIC) 15 MG tablet Take 15 mg by mouth at bedtime.  . metFORMIN (GLUCOPHAGE) 1000 MG tablet Take 1,000 mg by mouth 2 (two) times daily.  . pantoprazole (PROTONIX) 40 MG tablet Take 40 mg by mouth daily.    . pravastatin (PRAVACHOL) 40 MG tablet Take 20 mg by mouth at bedtime.    No facility-administered encounter medications on file as of 09/30/2017.    ALLERGIES: Allergies  Allergen Reactions  . Sulfa Antibiotics Nausea And Vomiting  . Doxepin Rash   VACCINATION STATUS:  There is no immunization history on file for this patient.  Diabetes  She presents for her follow-up diabetic visit. She has type 2 diabetes mellitus. Onset time: She was diagnosed at approximate age of 42 years. Her disease course has been improving. There are no hypoglycemic associated symptoms. Pertinent negatives for hypoglycemia include no confusion, headaches, pallor or seizures. Pertinent negatives for diabetes include no blurred vision, no chest pain, no fatigue, no polydipsia, no polyphagia and no polyuria. There are no hypoglycemic complications. Symptoms are improving. Diabetic complications  include peripheral neuropathy. Risk factors for coronary artery disease include diabetes mellitus, dyslipidemia, hypertension, obesity, family history and sedentary lifestyle. Current diabetic treatment includes intensive insulin program and oral agent (monotherapy). Her weight is decreasing steadily. She is following a generally unhealthy diet. When asked about meal planning, she reported none. She has not had a previous visit with a dietitian. She never participates in exercise. Her breakfast blood glucose range is generally 130-140  mg/dl. Her lunch blood glucose range is generally 130-140 mg/dl. Her dinner blood glucose range is generally 130-140 mg/dl. Her bedtime blood glucose range is generally 130-140 mg/dl. Her overall blood glucose range is 130-140 mg/dl. (She brings in the log showing more than ago. Glucose 2-3 times a day, lately improving to average of 150-175.) An ACE inhibitor/angiotensin II receptor blocker is being taken. Eye exam is current.  Hyperlipidemia  This is a chronic problem. The current episode started more than 1 year ago. The problem is controlled. Exacerbating diseases include diabetes and obesity. Pertinent negatives include no chest pain, myalgias or shortness of breath. Current antihyperlipidemic treatment includes statins. Risk factors for coronary artery disease include dyslipidemia, diabetes mellitus, hypertension, obesity, a sedentary lifestyle and family history.  Hypertension  This is a chronic problem. The current episode started more than 1 year ago. The problem is uncontrolled. Pertinent negatives include no blurred vision, chest pain, headaches, palpitations or shortness of breath. Risk factors for coronary artery disease include diabetes mellitus, dyslipidemia, obesity, family history and sedentary lifestyle. Past treatments include angiotensin blockers.     Review of Systems  Constitutional: Negative for chills, fatigue, fever and unexpected weight change.  HENT: Negative for trouble swallowing and voice change.   Eyes: Negative for blurred vision and visual disturbance.  Respiratory: Negative for cough, shortness of breath and wheezing.   Cardiovascular: Negative for chest pain, palpitations and leg swelling.  Gastrointestinal: Negative for diarrhea, nausea and vomiting.  Endocrine: Negative for cold intolerance, heat intolerance, polydipsia, polyphagia and polyuria.  Musculoskeletal: Negative for arthralgias and myalgias.  Skin: Negative for color change, pallor, rash and wound.   Neurological: Negative for seizures and headaches.  Psychiatric/Behavioral: Negative for confusion and suicidal ideas.    Objective:    BP 131/84   Pulse (!) 101   Ht '5\' 7"'  (1.702 m)   Wt 288 lb (130.6 kg)   BMI 45.11 kg/m   Wt Readings from Last 3 Encounters:  09/30/17 288 lb (130.6 kg)  06/28/17 297 lb (134.7 kg)  06/28/17 297 lb (134.7 kg)    Physical Exam  Constitutional: She is oriented to person, place, and time. She appears well-developed.  HENT:  Head: Normocephalic and atraumatic.  Edentulous.   Eyes: EOM are normal.  Neck: Normal range of motion. Neck supple. No tracheal deviation present. No thyromegaly present.  Cardiovascular: Normal rate and regular rhythm.  Pulmonary/Chest: Effort normal and breath sounds normal.  Abdominal: Soft. Bowel sounds are normal. There is no tenderness. There is no guarding.  Musculoskeletal: Normal range of motion. She exhibits no edema.  Neurological: She is alert and oriented to person, place, and time. She has normal reflexes. No cranial nerve deficit. Coordination normal.  Skin: Skin is warm and dry. No rash noted. No erythema. No pallor.  Psychiatric: She has a normal mood and affect. Judgment normal.   Recent Results (from the past 2160 hour(s))  Renal function panel     Status: Abnormal   Collection Time: 09/23/17  9:36 AM  Result Value Ref Range  Glucose, Bld 154 (H) 65 - 99 mg/dL    Comment: .            Fasting reference interval . For someone without known diabetes, a glucose value >125 mg/dL indicates that they may have diabetes and this should be confirmed with a follow-up test. .    BUN 15 7 - 25 mg/dL   Creat 0.46 (L) 0.50 - 1.05 mg/dL    Comment: For patients >49 years of age, the reference limit for Creatinine is approximately 13% higher for people identified as African-American. .    BUN/Creatinine Ratio 33 (H) 6 - 22 (calc)   Sodium 143 135 - 146 mmol/L   Potassium 4.0 3.5 - 5.3 mmol/L   Chloride  105 98 - 110 mmol/L   CO2 29 20 - 32 mmol/L   Calcium 9.3 8.6 - 10.4 mg/dL   Phosphorus 3.7 2.5 - 4.5 mg/dL   Albumin 3.6 3.6 - 5.1 g/dL  Hemoglobin A1c     Status: Abnormal   Collection Time: 09/23/17  9:36 AM  Result Value Ref Range   Hgb A1c MFr Bld 6.9 (H) <5.7 % of total Hgb    Comment: For someone without known diabetes, a hemoglobin A1c value of 6.5% or greater indicates that they may have  diabetes and this should be confirmed with a follow-up  test. . For someone with known diabetes, a value <7% indicates  that their diabetes is well controlled and a value  greater than or equal to 7% indicates suboptimal  control. A1c targets should be individualized based on  duration of diabetes, age, comorbid conditions, and  other considerations. . Currently, no consensus exists regarding use of hemoglobin A1c for diagnosis of diabetes for children. .    Mean Plasma Glucose 151 (calc)   eAG (mmol/L) 8.4 (calc)    A1c from a April  4 was 12%.    Assessment & Plan:   1. Type 2 diabetes mellitus with complication, with long-term current use of insulin ([place]) - Patient has currently uncontrolled symptomatic type 2 DM since  61 years of age. - She came with significant improvement in her glycemic profile, repeat A1c 6.9%, progressively improving from 12%.   Recent labs reviewed.   Her diabetes is complicated by obesity/sedentary life, peripheral neuropathy and patient remains at a high risk for more acute and chronic complications which include CAD, CVA, CKD, retinopathy, and neuropathy. These are all discussed in detail with the patient.  - I have counseled the patient on diet management and weight loss, by adopting a carbohydrate restricted/protein rich diet.  -  Suggestion is made for her to avoid simple carbohydrates  from her diet including Cakes, Sweet Desserts / Pastries, Ice Cream, Soda (diet and regular), Sweet Tea, Candies, Chips, Cookies, Store Bought Juices, Alcohol in  Excess of  1-2 drinks a day, Artificial Sweeteners, and "Sugar-free" Products. This will help patient to have stable blood glucose profile and potentially avoid unintended weight gain.   - I encouraged the patient to switch to  unprocessed or minimally processed complex starch and increased protein intake (animal or plant source), fruits, and vegetables.  - Patient is advised to stick to a routine mealtimes to eat 3 meals  a day and avoid unnecessary snacks ( to snack only to correct hypoglycemia).    - I have approached patient with the following individualized plan to manage diabetes and patient agrees:   - I  will continue her basal insulin Lantus at  50 units daily at bedtime , and lower her NovoLog to 5 units 3 times a day with meals  for pre-meal BG readings of 90-153m/dl, plus patient specific correction dose for unexpected hyperglycemia above 1548mdl, associated with strict monitoring of glucose 4 times a day-before meals and at bedtime. - Patient is warned not to take insulin without proper monitoring per orders. -Adjustment parameters are given for hypo and hyperglycemia in writing. -Patient is encouraged to call clinic for blood glucose levels less than 70 or above 200 mg /dl. - I will continue metformin 1000 g by mouth twice a day, therapeutically suitable for patient . - I discussed and initiated weekly Trulicity 0.2.02g subcutaneous daily to advance if tolerated and if covered by her insurance. I gave her 2 pens samples from clinic today.  - Patient specific target  A1c;  LDL, HDL, Triglycerides, and  Waist Circumference were discussed in detail.  2) BP/HTN:  Controlled. I advised her to continue current medications including  ACEI/ARB. 3) Lipids/HPL:   Controlled.   Patient is advised to continue statins. 4)  Weight/Diet: CDE Consult has been initiated , exercise, and detailed carbohydrates information provided. 5) history of hyperthyroidism: She was treated with low-dose  methimazole for several years. I advised her to discontinue methimazole. her repeat labs from July where within normal limits. She is clinically euthyroid. She will have repeat thyroid function test  along with her next previsit labs.   6) Chronic Care/Health Maintenance:  -Patient is on ACEI/ARB and Statin medications and encouraged to continue to follow up with Ophthalmology, Podiatrist at least yearly or according to recommendations, and advised to   stay away from smoking. I have recommended yearly flu vaccine and pneumonia vaccination at least every 5 years; moderate intensity exercise for up to 150 minutes weekly; and  sleep for at least 7 hours a day.  - I advised patient to maintain close follow up with BuStephens ShireMD for primary care needs.  - Time spent with the patient: 25 min, of which >50% was spent in reviewing her sugar logs , discussing her hypo- and hyper-glycemic episodes, reviewing her current and  previous labs and insulin doses and developing a plan to avoid hypo- and hyper-glycemia.    Follow up plan: - Return in about 3 months (around 12/31/2017) for follow up with pre-visit labs, meter, and logs.  GeGlade LloydMD Phone: [phone]: [phone]-  This note was partially dictated with voice recognition software. Similar sounding words can be transcribed inadequately or may not  be corrected upon review.  09/30/2017, 10:52 AM

## 2017-10-05 ENCOUNTER — Encounter: Payer: Self-pay | Admitting: "Endocrinology

## 2017-10-05 DIAGNOSIS — D509 Iron deficiency anemia, unspecified: Secondary | ICD-10-CM | POA: Insufficient documentation

## 2017-10-07 ENCOUNTER — Encounter: Payer: Self-pay | Admitting: Nutrition

## 2017-10-07 NOTE — Patient Instructions (Signed)
Keep up the great job! Eat more fresh fruits and vegetable with meals Increase physical activity Drink water. Prevent low blood sugars.

## 2017-11-29 ENCOUNTER — Other Ambulatory Visit: Payer: Self-pay | Admitting: "Endocrinology

## 2017-12-03 ENCOUNTER — Other Ambulatory Visit: Payer: Self-pay

## 2018-01-07 ENCOUNTER — Ambulatory Visit: Payer: Medicare HMO | Admitting: "Endocrinology

## 2018-01-10 ENCOUNTER — Ambulatory Visit: Payer: Medicare HMO | Admitting: Nutrition

## 2018-01-10 ENCOUNTER — Ambulatory Visit: Payer: Medicare HMO | Admitting: "Endocrinology

## 2018-01-19 LAB — COMPLETE METABOLIC PANEL WITH GFR
AG RATIO: 1.4 (calc) (ref 1.0–2.5)
ALT: 26 U/L (ref 6–29)
AST: 31 U/L (ref 10–35)
Albumin: 3.8 g/dL (ref 3.6–5.1)
Alkaline phosphatase (APISO): 79 U/L (ref 33–130)
BILIRUBIN TOTAL: 0.6 mg/dL (ref 0.2–1.2)
BUN: 11 mg/dL (ref 7–25)
CHLORIDE: 104 mmol/L (ref 98–110)
CO2: 27 mmol/L (ref 20–32)
Calcium: 9.3 mg/dL (ref 8.6–10.4)
Creat: 0.54 mg/dL (ref 0.50–0.99)
GFR, EST AFRICAN AMERICAN: 119 mL/min/{1.73_m2} (ref >=60)
GFR, Est Non African American: 103 mL/min/{1.73_m2} (ref >=60)
GLOBULIN: 2.7 g/dL (ref 1.9–3.7)
Glucose, Bld: 142 mg/dL — ABNORMAL HIGH (ref 65–99)
Potassium: 3.9 mmol/L (ref 3.5–5.3)
SODIUM: 141 mmol/L (ref 135–146)
TOTAL PROTEIN: 6.5 g/dL (ref 6.1–8.1)

## 2018-01-19 LAB — HEMOGLOBIN A1C
HEMOGLOBIN A1C: 6.5 %{Hb} — AB (ref <5.7)
MEAN PLASMA GLUCOSE: 140 (calc)
eAG (mmol/L): 7.7 (calc)

## 2018-01-19 LAB — VITAMIN D 25 HYDROXY (VIT D DEFICIENCY, FRACTURES): Vit D, 25-Hydroxy: 66 ng/mL (ref 30–100)

## 2018-01-19 LAB — TSH: TSH: <0.01 mIU/L — ABNORMAL LOW (ref 0.40–4.50)

## 2018-01-19 LAB — T4, FREE: Free T4: 2.8 ng/dL — ABNORMAL HIGH (ref 0.8–1.8)

## 2018-01-26 ENCOUNTER — Ambulatory Visit: Payer: Medicare HMO | Admitting: "Endocrinology

## 2018-01-26 ENCOUNTER — Encounter: Payer: Self-pay | Admitting: "Endocrinology

## 2018-01-26 ENCOUNTER — Ambulatory Visit: Payer: Medicare HMO | Admitting: Nutrition

## 2018-01-26 VITALS — BP 118/80 | HR 100 | Ht 67.0 in | Wt 275.0 lb

## 2018-01-26 DIAGNOSIS — E782 Mixed hyperlipidemia: Secondary | ICD-10-CM

## 2018-01-26 DIAGNOSIS — I10 Essential (primary) hypertension: Secondary | ICD-10-CM | POA: Diagnosis not present

## 2018-01-26 DIAGNOSIS — E118 Type 2 diabetes mellitus with unspecified complications: Secondary | ICD-10-CM | POA: Diagnosis not present

## 2018-01-26 DIAGNOSIS — Z794 Long term (current) use of insulin: Secondary | ICD-10-CM

## 2018-01-26 DIAGNOSIS — E0590 Thyrotoxicosis, unspecified without thyrotoxic crisis or storm: Secondary | ICD-10-CM | POA: Diagnosis not present

## 2018-01-26 MED ORDER — DULAGLUTIDE 1.5 MG/0.5ML ~~LOC~~ SOAJ: 1.5000 mg | SUBCUTANEOUS | 2 refills | Status: DC

## 2018-01-26 NOTE — Progress Notes (Signed)
Subjective:    Patient ID: [name], female    DOB: 1955-12-28. Patient is being seen in f/u for management of diabetes requested by  Stephens Shire, MD  Past Medical History:  Diagnosis Date  . Anxiety   . Diabetes mellitus   . Essential hypertension, benign 2009  . GERD (gastroesophageal reflux disease)   . Osteoarthritis   . Palpitations    Tachypalpitations on beta blockers since 2010  . Type 2 diabetes mellitus ([place]) 2003   Past Surgical History:  Procedure Laterality Date  . ABDOMINAL HYSTERECTOMY     History of cervical cancer  . TONSILLECTOMY     Social History   Socioeconomic History  . Marital status: Single    Spouse name: None  . Number of children: 2  . Years of education: None  . Highest education level: None  Social Needs  . Financial resource strain: None  . Food insecurity - worry: None  . Food insecurity - inability: None  . Transportation needs - medical: None  . Transportation needs - non-medical: None  Occupational History  . Occupation: DISABLED    Employer: UNEMPLOYED    Comment: OSTEOARTHRITIS  Tobacco Use  . Smoking status: Never Smoker  . Smokeless tobacco: Never Used  Substance and Sexual Activity  . Alcohol use: No  . Drug use: No  . Sexual activity: No  Other Topics Concern  . None  Social History Narrative   Has 2 grandchildren. 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 blood glucose meter kit and supplies KIT Dispense based on patient and insurance preference. Use up to four times daily as directed. (FOR ICD-10 E11.65)  . Cholecalciferol (VITAMIN D3) 5000 units TABS Take by mouth.  . Dulaglutide (TRULICITY) 1.5 EQ/6.8TM SOPN Inject 1.5 mg into the skin once a week.  . gabapentin (NEURONTIN) 400 MG capsule Take 400 mg by mouth 3 (three) times daily.  . Insulin Glargine (LANTUS SOLOSTAR) 100 UNIT/ML Solostar Pen Inject 50 Units into the  skin at bedtime.  Marland Kitchen losartan (COZAAR) 100 MG tablet Take 100 mg by mouth daily.  . magnesium oxide (MAG-OX) 400 MG tablet Take 400 mg by mouth 2 (two) times daily.  . meloxicam (MOBIC) 15 MG tablet Take 15 mg by mouth at bedtime.  . metFORMIN (GLUCOPHAGE) 1000 MG tablet Take 1,000 mg by mouth 2 (two) times daily.  . pantoprazole (PROTONIX) 40 MG tablet Take 40 mg by mouth daily.    . pravastatin (PRAVACHOL) 40 MG tablet Take 20 mg by mouth at bedtime.   . [DISCONTINUED] insulin aspart (NOVOLOG) 100 UNIT/ML FlexPen Inject 5-11 Units 3 (three) times daily before meals into the skin.  . [DISCONTINUED] TRULICITY 1.96 QI/2.9NL SOPN INJECT  0.75MG  INTO  THE  SKIN  ONCE  A  WEEK   No facility-administered encounter medications on file as of 01/26/2018.    ALLERGIES: Allergies  Allergen Reactions  . Sulfa Antibiotics Nausea And Vomiting  . Doxepin Rash   VACCINATION STATUS:  There is no immunization history on file for this patient.  Diabetes  She presents for her follow-up diabetic visit. She has type 2 diabetes mellitus. Onset time: She was diagnosed at approximate age of 63 years. Her disease course has been improving. There are no hypoglycemic associated symptoms. Pertinent negatives for hypoglycemia include no confusion, headaches, pallor or seizures. Pertinent negatives for diabetes include no blurred  vision, no chest pain, no fatigue, no polydipsia, no polyphagia and no polyuria. There are no hypoglycemic complications. Symptoms are improving. Diabetic complications include peripheral neuropathy. Risk factors for coronary artery disease include diabetes mellitus, dyslipidemia, hypertension, obesity, family history and sedentary lifestyle. Current diabetic treatment includes intensive insulin program and oral agent (monotherapy). Her weight is decreasing steadily. She is following a generally unhealthy diet. When asked about meal planning, she reported none. She has not had a previous visit with  a dietitian. She never participates in exercise. Her breakfast blood glucose range is generally 130-140 mg/dl. Her lunch blood glucose range is generally 130-140 mg/dl. Her dinner blood glucose range is generally 130-140 mg/dl. Her bedtime blood glucose range is generally 130-140 mg/dl. Her overall blood glucose range is 130-140 mg/dl. (She brings in the log showing more than ago. Glucose 2-3 times a day, lately improving to average of 150-175.) An ACE inhibitor/angiotensin II receptor blocker is being taken. Eye exam is current.  Hyperlipidemia  This is a chronic problem. The current episode started more than 1 year ago. The problem is controlled. Exacerbating diseases include diabetes and obesity. Pertinent negatives include no chest pain, myalgias or shortness of breath. Current antihyperlipidemic treatment includes statins. Risk factors for coronary artery disease include dyslipidemia, diabetes mellitus, hypertension, obesity, a sedentary lifestyle and family history.  Hypertension  This is a chronic problem. The current episode started more than 1 year ago. The problem is uncontrolled. Pertinent negatives include no blurred vision, chest pain, headaches, palpitations or shortness of breath. Risk factors for coronary artery disease include diabetes mellitus, dyslipidemia, obesity, family history and sedentary lifestyle. Past treatments include angiotensin blockers.     Review of Systems  Constitutional: Negative for chills, fatigue, fever and unexpected weight change.  HENT: Negative for trouble swallowing and voice change.   Eyes: Negative for blurred vision and visual disturbance.  Respiratory: Negative for cough, shortness of breath and wheezing.   Cardiovascular: Negative for chest pain, palpitations and leg swelling.  Gastrointestinal: Negative for diarrhea, nausea and vomiting.  Endocrine: Negative for cold intolerance, heat intolerance, polydipsia, polyphagia and polyuria.   Musculoskeletal: Negative for arthralgias and myalgias.  Skin: Negative for color change, pallor, rash and wound.  Neurological: Negative for seizures and headaches.  Psychiatric/Behavioral: Negative for confusion and suicidal ideas.    Objective:    BP 118/80   Pulse 100   Ht '5\' 7"'  (1.702 m)   Wt 275 lb (124.7 kg)   BMI 43.07 kg/m   Wt Readings from Last 3 Encounters:  01/26/18 275 lb (124.7 kg)  09/30/17 288 lb (130.6 kg)  06/28/17 297 lb (134.7 kg)    Physical Exam  Constitutional: She appears well-developed.  HENT:  Head: Normocephalic and atraumatic.  Edentulous.   Eyes: EOM are normal.  Neck: Normal range of motion. Neck supple. No tracheal deviation present. No thyromegaly present.  Cardiovascular: Normal rate.  Mild tachycardia.  Pulmonary/Chest: Effort normal and breath sounds normal.  Abdominal: Soft. Bowel sounds are normal. There is no tenderness. There is no guarding.  Musculoskeletal: Normal range of motion. She exhibits no edema.  Neurological: She is alert. She has normal reflexes. No cranial nerve deficit. Coordination normal.  Skin: Skin is warm and dry. No rash noted. No erythema. No pallor.  Psychiatric: She has a normal mood and affect. Judgment normal.   Recent Results (from the past 2160 hour(s))  COMPLETE METABOLIC PANEL WITH GFR     Status: Abnormal   Collection Time: 01/18/18  8:25 AM  Result Value Ref Range   Glucose, Bld 142 (H) 65 - 99 mg/dL    Comment: .            Fasting reference interval . For someone without known diabetes, a glucose value >125 mg/dL indicates that they may have diabetes and this should be confirmed with a follow-up test. .    BUN 11 7 - 25 mg/dL   Creat 0.54 0.50 - 0.99 mg/dL    Comment: For patients >40 years of age, the reference limit for Creatinine is approximately 13% higher for people identified as African-American. .    GFR, Est Non African American 103 > OR = 60 mL/min/1.63m   GFR, Est African  American 119 > OR = 60 mL/min/1.7109m  BUN/Creatinine Ratio NOT APPLICABLE 6 - 22 (calc)   Sodium 141 135 - 146 mmol/L   Potassium 3.9 3.5 - 5.3 mmol/L   Chloride 104 98 - 110 mmol/L   CO2 27 20 - 32 mmol/L   Calcium 9.3 8.6 - 10.4 mg/dL   Total Protein 6.5 6.1 - 8.1 g/dL   Albumin 3.8 3.6 - 5.1 g/dL   Globulin 2.7 1.9 - 3.7 g/dL (calc)   AG Ratio 1.4 1.0 - 2.5 (calc)   Total Bilirubin 0.6 0.2 - 1.2 mg/dL   Alkaline phosphatase (APISO) 79 33 - 130 U/L   AST 31 10 - 35 U/L   ALT 26 6 - 29 U/L  Hemoglobin A1c     Status: Abnormal   Collection Time: 01/18/18  8:25 AM  Result Value Ref Range   Hgb A1c MFr Bld 6.5 (H) <5.7 % of total Hgb    Comment: For someone without known diabetes, a hemoglobin A1c value of 6.5% or greater indicates that they may have  diabetes and this should be confirmed with a follow-up  test. . For someone with known diabetes, a value <7% indicates  that their diabetes is well controlled and a value  greater than or equal to 7% indicates suboptimal  control. A1c targets should be individualized based on  duration of diabetes, age, comorbid conditions, and  other considerations. . Currently, no consensus exists regarding use of hemoglobin A1c for diagnosis of diabetes for children. .    Mean Plasma Glucose 140 (calc)   eAG (mmol/L) 7.7 (calc)  TSH     Status: Abnormal   Collection Time: 01/18/18  8:25 AM  Result Value Ref Range   TSH <0.01 (L) 0.40 - 4.50 mIU/L  T4, free     Status: Abnormal   Collection Time: 01/18/18  8:25 AM  Result Value Ref Range   Free T4 2.8 (H) 0.8 - 1.8 ng/dL  VITAMIN D 25 Hydroxy (Vit-D Deficiency, Fractures)     Status: None   Collection Time: 01/18/18  8:25 AM  Result Value Ref Range   Vit D, 25-Hydroxy 66 30 - 100 ng/mL    Comment: Vitamin D Status         25-OH Vitamin D: . Deficiency:                    <20 ng/mL Insufficiency:             20 - 29 ng/mL Optimal:                 > or = 30 ng/mL . For 25-OH Vitamin  D testing on patients on  D2-supplementation and patients for whom quantitation  of D2  and D3 fractions is required, the Quest[employer]ureD(TM) 25-OH VIT D, (D2,D3), LC/MS/MS is recommended: order  code [id number] (patients >35yr). . For more information on this test, go to: http://education.questdiagnostics.com/faq/FAQ163 (This link is being provided for  informational/educational purposes only.)     A1c from a April  4 was 12%.    Assessment & Plan:   1. Type 2 diabetes mellitus with complication, with [place]term [place] - Patient has currently controlled ype 2 DM since  62years of age. - She came with significant improvement in her glycemic profile, repeat A1c 6.5%, progressively improving from 12%.   Recent labs reviewed.   Her diabetes is complicated by obesity/sedentary life, peripheral neuropathy and patient remains at a high risk for more acute and chronic complications which include CAD, CVA, CKD, retinopathy, and neuropathy. These are all discussed in detail with the patient.  - I have counseled the patient on diet management and weight loss, by adopting a carbohydrate restricted/protein rich diet.  -  Suggestion is made for her to avoid simple carbohydrates  from her diet including Cakes, Sweet Desserts / Pastries, Ice Cream, Soda (diet and regular), Sweet Tea, Candies, Chips, Cookies, Store Bought Juices, Alcohol in Excess of  1-2 drinks a day, Artificial Sweeteners, and "Sugar-free" Products. This will help patient to have stable blood glucose profile and potentially avoid unintended weight gain.  - I encouraged the patient to switch to  unprocessed or minimally processed complex starch and increased protein intake (animal or plant source), fruits, and vegetables.  - Patient is advised to stick to a routine mealtimes to eat 3 meals  a day and avoid unnecessary snacks ( to snack only to correct hypoglycemia).    - I have approached patient with the following  individualized plan to manage diabetes and patient agrees:   - I  will continue her basal insulin Lantus at 50 units daily at bedtime , and hold NovoLog for now, continue to monitor blood glucose 2 times daily-before breakfast and at bedtime.   - Patient is warned not to take insulin without proper monitoring per orders. -Adjustment parameters are given for hypo and hyperglycemia in writing. -Patient is encouraged to call clinic for blood glucose levels less than 70 or above 200 mg /dl. - I will continue metformin 1000 mg by mouth twice a day, therapeutically suitable for patient . - I will increase Trulicity to 1.5 mg subcutaneously weekly.    - Patient specific target  A1c;  LDL, HDL, Triglycerides, and  Waist Circumference were discussed in detail.  2) BP/HTN: Her blood pressure is controlled to target.  I advised her to continue current medications including  ACEI/ARB. 3) Lipids/HPL:   Controlled.   Patient is advised to continue statins. 4)  Weight/Diet: CDE Consult has been initiated , exercise, and detailed carbohydrates information provided. 5) history of hyperthyroidism: Her repeat labs show elevated thyroid hormone and suppressed TSH.  She is approached for workup with thyroid uptake and scan.  This test will be done as soon as possible.  If uptake is significantly elevated she will be offered treatment with radioactive iodine thyroid ablation.   6) Chronic Care/Health Maintenance:  -Patient is on ACEI/ARB and Statin medications and encouraged to continue to follow up with Ophthalmology, Podiatrist at least yearly or according to recommendations, and advised to   stay away from smoking. I have recommended yearly flu vaccine and pneumonia vaccination at least every 5 years; moderate intensity exercise for up to 150  minutes weekly; and  sleep for at least 7 hours a day.  - I advised patient to maintain close follow up with Stephens Shire, MD for primary care needs.  - Time spent with  the patient: 25 min, of which >50% was spent in reviewing her blood glucose logs , discussing her hypo- and hyper-glycemic episodes, reviewing her current and  previous labs and insulin doses and developing a plan to avoid hypo- and hyper-glycemia. Please refer to Patient Instructions for Blood Glucose Monitoring and Insulin/Medications Dosing Guide"  in media tab for additional information.    Follow up plan: - Return will call if uptake is abnormal, for follow up with pre-visit labs, meter, and logs.  Glade Lloyd, MD Phone: [phone]  Fax: [phone]  -  This note was partially dictated with voice recognition software. Similar sounding words can be transcribed inadequately or may not  be corrected upon review.  01/26/2018, 1:22 PM

## 2018-01-26 NOTE — Patient Instructions (Signed)

## 2018-03-24 ENCOUNTER — Other Ambulatory Visit: Payer: Self-pay

## 2018-03-24 MED ORDER — INSULIN GLARGINE 100 UNIT/ML SOLOSTAR PEN: 50.0000 [IU] | PEN_INJECTOR | Freq: Every day | SUBCUTANEOUS | 2 refills | Status: DC

## 2018-03-24 MED ORDER — DULAGLUTIDE 1.5 MG/0.5ML ~~LOC~~ SOAJ: 1.5000 mg | SUBCUTANEOUS | 0 refills | Status: DC

## 2018-03-29 ENCOUNTER — Other Ambulatory Visit: Payer: Self-pay

## 2018-03-29 MED ORDER — INSULIN GLARGINE 100 UNIT/ML SOLOSTAR PEN: 50.0000 [IU] | PEN_INJECTOR | Freq: Every day | SUBCUTANEOUS | 0 refills | Status: DC

## 2018-04-26 LAB — COMPLETE METABOLIC PANEL WITH GFR
AG Ratio: 1.4 (calc) (ref 1.0–2.5)
ALBUMIN MSPROF: 3.5 g/dL — AB (ref 3.6–5.1)
ALKALINE PHOSPHATASE (APISO): 74 U/L (ref 33–130)
ALT: 29 U/L (ref 6–29)
AST: 36 U/L — AB (ref 10–35)
BUN/Creatinine Ratio: 28 (calc) — ABNORMAL HIGH (ref 6–22)
BUN: 13 mg/dL (ref 7–25)
CALCIUM: 9.1 mg/dL (ref 8.6–10.4)
CO2: 29 mmol/L (ref 20–32)
CREATININE: 0.47 mg/dL — AB (ref 0.50–0.99)
Chloride: 107 mmol/L (ref 98–110)
GFR, EST NON AFRICAN AMERICAN: 107 mL/min/{1.73_m2} (ref >=60)
GFR, Est African American: 124 mL/min/{1.73_m2} (ref >=60)
GLOBULIN: 2.5 g/dL (ref 1.9–3.7)
GLUCOSE: 94 mg/dL (ref 65–99)
Potassium: 3.8 mmol/L (ref 3.5–5.3)
SODIUM: 143 mmol/L (ref 135–146)
Total Bilirubin: 0.5 mg/dL (ref 0.2–1.2)
Total Protein: 6 g/dL — ABNORMAL LOW (ref 6.1–8.1)

## 2018-04-26 LAB — HEMOGLOBIN A1C
EAG (MMOL/L): 7 (calc)
HEMOGLOBIN A1C: 6 %{Hb} — AB (ref <5.7)
MEAN PLASMA GLUCOSE: 126 (calc)

## 2018-04-28 ENCOUNTER — Encounter (HOSPITAL_COMMUNITY)
Admission: RE | Admit: 2018-04-28 | Discharge: 2018-04-28 | Disposition: A | Discharge status: Home / Self Care | Payer: Medicare HMO | Source: Ambulatory Visit | Attending: "Endocrinology | Admitting: "Endocrinology

## 2018-04-28 DIAGNOSIS — E0590 Thyrotoxicosis, unspecified without thyrotoxic crisis or storm: Secondary | ICD-10-CM | POA: Insufficient documentation

## 2018-04-28 MED ORDER — SODIUM IODIDE I-123 7.4 MBQ CAPS
200.0000 | ORAL_CAPSULE | Freq: Once | ORAL | Status: AC
  Administered 2018-04-28: 294 via ORAL

## 2018-04-29 ENCOUNTER — Encounter (HOSPITAL_COMMUNITY)
Admission: RE | Admit: 2018-04-29 | Discharge: 2018-04-29 | Disposition: A | Discharge status: Home / Self Care | Payer: Medicare HMO | Source: Ambulatory Visit | Attending: "Endocrinology | Admitting: "Endocrinology

## 2018-05-02 ENCOUNTER — Encounter: Payer: Self-pay | Admitting: "Endocrinology

## 2018-05-02 ENCOUNTER — Ambulatory Visit: Payer: Medicare HMO | Admitting: "Endocrinology

## 2018-05-02 VITALS — BP 123/76 | HR 101 | Ht 67.0 in | Wt 260.0 lb

## 2018-05-02 DIAGNOSIS — I10 Essential (primary) hypertension: Secondary | ICD-10-CM | POA: Diagnosis not present

## 2018-05-02 DIAGNOSIS — E782 Mixed hyperlipidemia: Secondary | ICD-10-CM | POA: Diagnosis not present

## 2018-05-02 DIAGNOSIS — E0590 Thyrotoxicosis, unspecified without thyrotoxic crisis or storm: Secondary | ICD-10-CM

## 2018-05-02 DIAGNOSIS — Z794 Long term (current) use of insulin: Secondary | ICD-10-CM

## 2018-05-02 DIAGNOSIS — E118 Type 2 diabetes mellitus with unspecified complications: Secondary | ICD-10-CM

## 2018-05-02 MED ORDER — INSULIN GLARGINE 100 UNIT/ML SOLOSTAR PEN: 30.0000 [IU] | PEN_INJECTOR | Freq: Every day | SUBCUTANEOUS | 0 refills | Status: DC

## 2018-05-02 NOTE — Patient Instructions (Signed)

## 2018-05-02 NOTE — Progress Notes (Signed)
Subjective:    Patient ID: [name], female    DOB: 22-Nov-1956. Patient is being seen in f/u for management of diabetes requested by  Stephens Shire, MD  Past Medical History:  Diagnosis Date  . Anxiety   . Diabetes mellitus   . Essential hypertension, benign 2009  . GERD (gastroesophageal reflux disease)   . Osteoarthritis   . Palpitations    Tachypalpitations on beta blockers since 2010  . Type 2 diabetes mellitus ([place]) 2003   Past Surgical History:  Procedure Laterality Date  . ABDOMINAL HYSTERECTOMY     History of cervical cancer  . TONSILLECTOMY     Social History   Socioeconomic History  . Marital status: Single    Spouse name: Not on file  . Number of children: 2  . Years of education: Not on file  . Highest education level: Not on file  Occupational History  . Occupation: DISABLED    Employer: UNEMPLOYED    Comment: OSTEOARTHRITIS  Social Needs  . Financial resource strain: Not on file  . Food insecurity:    Worry: Not on file    Inability: Not on file  . Transportation needs:    Medical: Not on file    Non-medical: Not on file  Tobacco Use  . Smoking status: Never Smoker  . Smokeless tobacco: Never Used  Substance and Sexual Activity  . Alcohol use: No  . Drug use: No  . Sexual activity: Never  Lifestyle  . Physical activity:    Days per week: Not on file    Minutes per session: Not on file  . Stress: Not on file  Relationships  . Social connections:    Talks on phone: Not on file    Gets together: Not on file    Attends religious service: Not on file    Active member of club or organization: Not on file    Attends meetings of clubs or organizations: Not on file    Relationship status: Not on file  Other Topics Concern  . Not on file  Social History Narrative   Has 2 grandchildren. 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 blood glucose meter  kit and supplies KIT Dispense based on patient and insurance preference. Use up to four times daily as directed. (FOR ICD-10 E11.65)  . Cholecalciferol (VITAMIN D3) 5000 units TABS Take by mouth.  . Dulaglutide (TRULICITY) 1.5 EY/8.1KG SOPN Inject 1.5 mg into the skin once a week.  . gabapentin (NEURONTIN) 400 MG capsule Take 400 mg by mouth 3 (three) times daily.  . Insulin Glargine (LANTUS SOLOSTAR) 100 UNIT/ML Solostar Pen Inject 30 Units into the skin at bedtime.  Marland Kitchen losartan (COZAAR) 100 MG tablet Take 100 mg by mouth daily.  . magnesium oxide (MAG-OX) 400 MG tablet Take 400 mg by mouth 2 (two) times daily.  . meloxicam (MOBIC) 15 MG tablet Take 15 mg by mouth at bedtime.  . metFORMIN (GLUCOPHAGE) 1000 MG tablet Take 1,000 mg by mouth 2 (two) times daily.  . pantoprazole (PROTONIX) 40 MG tablet Take 40 mg by mouth daily.    . pravastatin (PRAVACHOL) 40 MG tablet Take 20 mg by mouth at bedtime.   . [DISCONTINUED] Insulin Glargine (LANTUS SOLOSTAR) 100 UNIT/ML Solostar Pen Inject 50 Units into the skin at bedtime.   No facility-administered encounter medications on file as of 05/02/2018.  ALLERGIES: Allergies  Allergen Reactions  . Sulfa Antibiotics Nausea And Vomiting  . Doxepin Rash   VACCINATION STATUS:  There is no immunization history on file for this patient.  Diabetes  She presents for her follow-up diabetic visit. She has type 2 diabetes mellitus. Onset time: She was diagnosed at approximate age of 70 years. Her disease course has been improving. There are no hypoglycemic associated symptoms. Pertinent negatives for hypoglycemia include no confusion, headaches, pallor or seizures. Pertinent negatives for diabetes include no blurred vision, no chest pain, no fatigue, no polydipsia, no polyphagia and no polyuria. There are no hypoglycemic complications. Symptoms are improving. Diabetic complications include peripheral neuropathy. Risk factors for coronary artery disease include  diabetes mellitus, dyslipidemia, hypertension, obesity, family history and sedentary lifestyle. Current diabetic treatment includes intensive insulin program and oral agent (monotherapy). Her weight is decreasing steadily. She is following a generally unhealthy diet. When asked about meal planning, she reported none. She has not had a previous visit with a dietitian. She never participates in exercise. Her breakfast blood glucose range is generally 130-140 mg/dl. Her overall blood glucose range is 130-140 mg/dl. (She returns with significant improvement in her glycemic profile and A1c of 6%.) An ACE inhibitor/angiotensin II receptor blocker is being taken. Eye exam is current.  Hyperlipidemia  This is a chronic problem. The current episode started more than 1 year ago. The problem is controlled. Exacerbating diseases include diabetes and obesity. Pertinent negatives include no chest pain, myalgias or shortness of breath. Current antihyperlipidemic treatment includes statins. Risk factors for coronary artery disease include dyslipidemia, diabetes mellitus, hypertension, obesity, a sedentary lifestyle, family history and post-menopausal.  Hypertension  This is a chronic problem. The current episode started more than 1 year ago. The problem is uncontrolled. Pertinent negatives include no blurred vision, chest pain, headaches, palpitations or shortness of breath. Risk factors for coronary artery disease include diabetes mellitus, dyslipidemia, obesity, family history and sedentary lifestyle. Past treatments include angiotensin blockers.     Review of Systems  Constitutional: Negative for chills, fatigue, fever and unexpected weight change.  HENT: Negative for trouble swallowing and voice change.   Eyes: Negative for blurred vision and visual disturbance.  Respiratory: Negative for cough, shortness of breath and wheezing.   Cardiovascular: Negative for chest pain, palpitations and leg swelling.   Gastrointestinal: Negative for diarrhea, nausea and vomiting.  Endocrine: Negative for cold intolerance, heat intolerance, polydipsia, polyphagia and polyuria.  Musculoskeletal: Negative for arthralgias and myalgias.  Skin: Negative for color change, pallor, rash and wound.  Neurological: Negative for seizures and headaches.  Psychiatric/Behavioral: Negative for confusion and suicidal ideas.    Objective:    BP 123/76   Pulse (!) 101   Ht 5' 7" (1.702 m)   Wt 260 lb (117.9 kg)   BMI 40.72 kg/m   Wt Readings from Last 3 Encounters:  05/02/18 260 lb (117.9 kg)  01/26/18 275 lb (124.7 kg)  09/30/17 288 lb (130.6 kg)    Physical Exam  Constitutional: She appears well-developed.  HENT:  Head: Normocephalic and atraumatic.  Edentulous.   Eyes: EOM are normal.  Neck: Normal range of motion. Neck supple. No tracheal deviation present. No thyromegaly present.  Cardiovascular: Normal rate.  Mild tachycardia.  Pulmonary/Chest: Effort normal.  Abdominal: There is no tenderness. There is no guarding.  Musculoskeletal: Normal range of motion. She exhibits no edema.  Neurological: She is alert. She has normal reflexes. No cranial nerve deficit. Coordination normal.  Skin: Skin is warm  and dry. No rash noted. No erythema. No pallor.  Psychiatric: She has a normal mood and affect. Judgment normal.   Recent Results (from the past 2160 hour(s))  COMPLETE METABOLIC PANEL WITH GFR     Status: Abnormal   Collection Time: 04/25/18  8:40 AM  Result Value Ref Range   Glucose, Bld 94 65 - 99 mg/dL    Comment: .            Fasting reference interval .    BUN 13 7 - 25 mg/dL   Creat 0.47 (L) 0.50 - 0.99 mg/dL    Comment: For patients >67 years of age, the reference limit for Creatinine is approximately 13% higher for people identified as African-American. .    GFR, Est Non African American 107 > OR = 60 mL/min/1.41m   GFR, Est African American 124 > OR = 60 mL/min/1.720m   BUN/Creatinine Ratio 28 (H) 6 - 22 (calc)   Sodium 143 135 - 146 mmol/L   Potassium 3.8 3.5 - 5.3 mmol/L   Chloride 107 98 - 110 mmol/L   CO2 29 20 - 32 mmol/L   Calcium 9.1 8.6 - 10.4 mg/dL   Total Protein 6.0 (L) 6.1 - 8.1 g/dL   Albumin 3.5 (L) 3.6 - 5.1 g/dL   Globulin 2.5 1.9 - 3.7 g/dL (calc)   AG Ratio 1.4 1.0 - 2.5 (calc)   Total Bilirubin 0.5 0.2 - 1.2 mg/dL   Alkaline phosphatase (APISO) 74 33 - 130 U/L   AST 36 (H) 10 - 35 U/L   ALT 29 6 - 29 U/L  Hemoglobin A1c     Status: Abnormal   Collection Time: 04/25/18  8:40 AM  Result Value Ref Range   Hgb A1c MFr Bld 6.0 (H) <5.7 % of total Hgb    Comment: For someone without known diabetes, a hemoglobin  A1c value between 5.7% and 6.4% is consistent with prediabetes and should be confirmed with a  follow-up test. . For someone with known diabetes, a value <7% indicates that their diabetes is well controlled. A1c targets should be individualized based on duration of diabetes, age, comorbid conditions, and other considerations. . This assay result is consistent with an increased risk of diabetes. . Currently, no consensus exists regarding use of hemoglobin A1c for diagnosis of diabetes for children. .    Mean Plasma Glucose 126 (calc)   eAG (mmol/L) 7.0 (calc)    A1c from a April  4 was 12%.    Assessment & Plan:   1. Type 2 diabetes mellitus with complication, with long-term current use of insulin ([facility]- Patient has currently controlled ype 2 DM since  [age]ears of age. - She came with significant improvement in her glycemic profile, repeat previsit labs show A1c of 6%, progressively improving from 12%.   Recent labs reviewed.   Her diabetes is complicated by obesity/sedentary life, peripheral neuropathy and patient remains at a high risk for more acute and chronic complications which include CAD, CVA, CKD, retinopathy, and neuropathy. These are all discussed in detail with the patient.  - I have counseled the  patient on diet management and weight loss, by adopting a carbohydrate restricted/protein rich diet.  -  Suggestion is made for her to avoid simple carbohydrates  from her diet including Cakes, Sweet Desserts / Pastries, Ice Cream, Soda (diet and regular), Sweet Tea, Candies, Chips, Cookies, Store Bought Juices, Alcohol in Excess of  1-2 drinks a day, Artificial Sweeteners, and "  Sugar-free" Products. This will help patient to have stable blood glucose profile and potentially avoid unintended weight gain.   - I encouraged the patient to switch to  unprocessed or minimally processed complex starch and increased protein intake (animal or plant source), fruits, and vegetables.  - Patient is advised to stick to a routine mealtimes to eat 3 meals  a day and avoid unnecessary snacks ( to snack only to correct hypoglycemia).    - I have approached patient with the following individualized plan to manage diabetes and patient agrees:   - I advised her to lower her Lantus to 30  units daily at bedtime , and to need to hold  NovoLog for now, continue to monitor blood glucose 2 times daily-before breakfast and at bedtime.   - Patient is warned not to take insulin without proper monitoring per orders. -Adjustment parameters are given for hypo and hyperglycemia in writing. -Patient is encouraged to call clinic for blood glucose levels less than 70 or above 200 mg /dl. - I will continue metformin 1000 mg by mouth twice a day, therapeutically suitable for patient . - I advised her to continue Trulicity to 1.5 mg subcutaneously weekly.    - Patient specific target  A1c;  LDL, HDL, Triglycerides, and  Waist Circumference were discussed in detail.  2) BP/HTN: Her blood pressure is controlled to target.  I advised her to continue current medications including  ACEI/ARB. 3) Lipids/HPL:   Controlled.   Patient is advised to continue statins. 4)  Weight/Diet: CDE Consult has been initiated , exercise, and detailed  carbohydrates information provided. 5) history of hyperthyroidism: Her repeat labs show elevated thyroid hormone and suppressed TSH.  Her thyroid uptake and scan is significant for 23% uptake, possible warm nodule on the right lobe of the thyroid.   -She was previously treated with antithyroid medications with mixed success.  I approached her for ablative therapy with radioactive iodine  and she accepts.    - This Treatment will be scheduled to be administered as soon as possible.  She is made aware of possibility of thyroid hormone replacement subsequent to the thyroid ablation    6) Chronic Care/Health Maintenance:  -Patient is on ACEI/ARB and Statin medications and encouraged to continue to follow up with Ophthalmology, Podiatrist at least yearly or according to recommendations, and advised to   stay away from smoking. I have recommended yearly flu vaccine and pneumonia vaccination at least every 5 years; moderate intensity exercise for up to 150 minutes weekly; and  sleep for at least 7 hours a day.  - I advised patient to maintain close follow up with Stephens Shire, MD for primary care needs.  - Time spent with the patient: 25 min, of which >50% was spent in reviewing her blood glucose logs , discussing her hypo- and hyper-glycemic episodes, reviewing her current and  previous labs and insulin doses and developing a plan to avoid hypo- and hyper-glycemia. Please refer to Patient Instructions for Blood Glucose Monitoring and Insulin/Medications Dosing Guide"  in media tab for additional information. [name] participated in the discussions, expressed understanding, and voiced agreement with the above plans.  All questions were answered to her satisfaction. she is encouraged to contact clinic should she have any questions or concerns prior to her return visit.  Follow up plan: - Return in about 3 months (around 08/02/2018) for follow up with pre-visit labs, meter, and logs, follow up with  labs after I131 therapy.  Glade Lloyd, MD Phone: [phone]  Fax: [phone]  -  This note was partially dictated with voice recognition software. Similar sounding words can be transcribed inadequately or may not  be corrected upon review.  05/02/2018, 12:12 PM

## 2018-05-09 ENCOUNTER — Telehealth: Payer: Self-pay | Admitting: *Deleted

## 2018-05-09 NOTE — Telephone Encounter (Signed)
Faxed referral from Terrill Mohr MD of [facility] placed in scheduling box  [phone]  [phone]

## 2018-05-16 ENCOUNTER — Ambulatory Visit (HOSPITAL_COMMUNITY)
Admission: RE | Admit: 2018-05-16 | Discharge: 2018-05-16 | Disposition: A | Discharge status: Home / Self Care | Payer: Medicare HMO | Source: Ambulatory Visit | Attending: "Endocrinology | Admitting: "Endocrinology

## 2018-05-16 DIAGNOSIS — E0590 Thyrotoxicosis, unspecified without thyrotoxic crisis or storm: Secondary | ICD-10-CM | POA: Insufficient documentation

## 2018-05-16 MED ORDER — SODIUM IODIDE I 131 CAPSULE
40.0000 | Freq: Once | INTRAVENOUS | Status: AC | PRN
  Administered 2018-05-16: 38.1 via ORAL

## 2018-06-06 ENCOUNTER — Telehealth: Payer: Self-pay | Admitting: "Endocrinology

## 2018-06-06 MED ORDER — METFORMIN HCL 1000 MG PO TABS: 1000.0000 mg | ORAL_TABLET | Freq: Two times a day (BID) | ORAL | 0 refills | Status: DC

## 2018-06-06 NOTE — Telephone Encounter (Signed)
[name] is calling stating that she has got mixed up on her metFORMIN (GLUCOPHAGE) 1000 MG tablet  And is asking to call in a months worth to [facility] until she can get her Rx from the mail order, please advise?

## 2018-06-14 ENCOUNTER — Encounter: Payer: Self-pay | Admitting: Cardiology

## 2018-06-14 NOTE — Progress Notes (Signed)
Cardiology Office Note  Date: 06/15/2018   ID: [name], DOB 07-09-1956, MRN [id number]  PCP: Stephens Shire, MD  Consulting Cardiologist: Rozann Lesches, MD   Chief Complaint  Patient presents with  . Heart Murmur    History of Present Illness: [name] is a 62 y.o. female referred for cardiology consultation by Dr. Daron Offer of [place] for the evaluation of heart murmur.  She presents today stating that she is very nervous.  She is worried about symptoms that she has not mentioned before today. She states that when she gets upset or is very busy with activities she feels a discomfort between her shoulder blades, radiates up into her neck and shoulders.  This has been going on for several weeks.  She tells me that her father has heart disease, underwent revascularization in his 79s.  She has a personal history of hypertension, type 2 diabetes mellitus, and also thyroid disease.  She is apparently working through hyperthyroidism at the present time under the care of endocrinology, was treated with radioactive iodine in late June. She feels like her heart rate is "pounding" and does have an elevated heart rate at rest.  I went over her current medications.  She reports compliance.  Blood pressure is significantly elevated today, but again she states that she is very nervous.  Review of her vital sign flow sheet shows typically normal blood pressure recently.  Echocardiogram in 2012 did not demonstrate any valvular abnormalities as described below.  She has not undergone any ischemic testing.  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  Past Medical History:  Diagnosis Date  . Anxiety   . Essential hypertension 2009  . GERD (gastroesophageal reflux disease)   . Iron deficiency anemia   . Osteoarthritis   . Palpitations    Tachypalpitations on beta blockers since 2010  . Type 2 diabetes  mellitus ([place]) 2003  . Vitamin D deficiency     Past Surgical History:  Procedure Laterality Date  . ABDOMINAL HYSTERECTOMY     History of cervical cancer  . TONSILLECTOMY      Current Outpatient Medications  Medication Sig Dispense Refill  . blood glucose meter kit and supplies KIT Dispense based on patient and insurance preference. Use up to four times daily as directed. (FOR ICD-10 E11.65) 1 each 0  . Cholecalciferol (VITAMIN D3) 5000 units TABS Take by mouth.    . Dulaglutide (TRULICITY) 1.5 BM/8.4XL SOPN Inject 1.5 mg into the skin once a week. 6 mL 0  . FERROUSUL 325 (65 Fe) MG tablet Take 1 tablet by mouth daily.    Marland Kitchen gabapentin (NEURONTIN) 400 MG capsule Take 400 mg by mouth 3 (three) times daily.    . Insulin Glargine (LANTUS SOLOSTAR) 100 UNIT/ML Solostar Pen Inject 30 Units into the skin at bedtime. 45 mL 0  . losartan (COZAAR) 100 MG tablet Take 100 mg by mouth daily.    . magnesium oxide (MAG-OX) 400 MG tablet Take 400 mg by mouth 2 (two) times daily.    . meloxicam (MOBIC) 15 MG tablet Take 15 mg by mouth at bedtime.    . metFORMIN (GLUCOPHAGE) 1000 MG tablet Take 1 tablet (1,000 mg total) by mouth 2 (two) times daily. 180 tablet 0  . pantoprazole (PROTONIX) 40 MG tablet Take 40 mg by mouth daily.      . pravastatin (PRAVACHOL) 40 MG tablet Take 20 mg  by mouth at bedtime.      No current facility-administered medications for this visit.    Allergies:  Sulfa antibiotics and Doxepin   Social History: The patient  reports that she has never smoked. She has never used smokeless tobacco. She reports that she does not drink alcohol or use drugs.   Family History: The patient's family history includes Diabetes in her father; Heart failure in her mother; Hypertension in her father; Stroke (age of onset: 79) in her sister.   ROS:  Please see the history of present illness. Otherwise, complete review of systems is positive for anxiety, mild dependent ankle edema gets better when  she puts her feet up or at nighttime.  All other systems are reviewed and negative.   Physical Exam: VS:  BP (!) 168/102 (BP Location: Right Arm, Cuff Size: Large)   Pulse (!) 102   Ht '5\' 7"'  (1.702 m)   Wt 249 lb 6.4 oz (113.1 kg)   SpO2 96% Comment: on room air  BMI 39.06 kg/m , BMI Body mass index is 39.06 kg/m.  Wt Readings from Last 3 Encounters:  06/15/18 249 lb 6.4 oz (113.1 kg)  05/02/18 260 lb (117.9 kg)  01/26/18 275 lb (124.7 kg)    General: Obese woman, no distress. HEENT: Conjunctiva and lids normal, oropharynx clear. Neck: Supple, no elevated JVP or carotid bruits, no thyromegaly. Lungs: Clear to auscultation, nonlabored breathing at rest. Cardiac: Regular rate and rhythm, no S3, 2/6 systolic murmur, no pericardial rub. Abdomen: Obese, nontender, bowel sounds present, no guarding or rebound. Extremities: Trace ankle edema, distal pulses 2+. Skin: Warm and dry. Musculoskeletal: No kyphosis. Neuropsychiatric: Alert and oriented x3, affect grossly appropriate.  ECG: I personally reviewed the tracing from 03/21/2013 which showed sinus rhythm.  Recent Labwork: 01/18/2018: TSH <0.01 04/25/2018: ALT 29; AST 36; BUN 13; Creat 0.47; Potassium 3.8; Sodium 143     Component Value Date/Time   CHOL 134 02/24/2017   TRIG 87 02/24/2017   HDL 49 02/24/2017   LDLCALC 75 02/24/2017  June 2019: Cholesterol 94, triglycerides 80, HDL 35, LDL 54, hemoglobin 11.0, platelets 183, iron 47, iron percent saturation 15, ferritin 14  Other Studies Reviewed Today:  Echocardiogram 04/10/2011: Normal LV wall thickness with LVEF 60%, normal RV contraction, no major valvular abnormalities, no pericardial effusion.  Cardiac monitor 04/03/2011: Sinus rhythm (51-142 bpm). Rare PVCs and PACs with single brief burst of PSVT. No sustained arrhythmias or pauses.  Assessment and Plan:  1.  Systolic murmur, suspect benign flow murmur at this time.  With hyperthyroidism resulting in tachycardia and  also concurrent diagnosis of anemia, this is most likely physiologic.  She did not have valvular heart disease documented by previous echocardiogram in 2012.  We will follow-up with a repeat echocardiogram to clarify.  2.  Intermittent thoracic discomfort with radiation to the neck and shoulders, rule out angina in a patient with obesity, type 2 diabetes mellitus, and hypertension.  Recent LDL was 54.  ECG shows left anterior fascicular block.  He has not undergone ischemic evaluation, we will arrange a 2-day protocol Lexiscan Myoview for objective evaluation.  3.  Hypertension, blood pressure significantly elevated today in the setting of anxiety.  Typically looks like blood pressure has been well controlled based on review of the chart.  I did not make specific medication adjustments today.  She will keep follow-up with PCP.  4.  Patient on Pravachol in the setting of type 2 diabetes mellitus, LDL 54.  Current  medicines were reviewed with the patient today.   Orders Placed This Encounter  Procedures  . NM Myocar Multi W/Spect W/Wall Motion / EF  . EKG 12-Lead  . ECHOCARDIOGRAM COMPLETE    Disposition: Call with test results.  Signed, Satira Sark, MD, [facility] 06/15/2018 1:53 PM     [facility] [address], [place], [place] [address] Phone: [phone]; Fax: [phone]

## 2018-06-15 ENCOUNTER — Ambulatory Visit: Payer: Medicare HMO | Admitting: Cardiology

## 2018-06-15 ENCOUNTER — Encounter: Payer: Self-pay | Admitting: *Deleted

## 2018-06-15 ENCOUNTER — Encounter: Payer: Self-pay | Admitting: Cardiology

## 2018-06-15 VITALS — BP 168/102 | HR 102 | Ht 67.0 in | Wt 249.4 lb

## 2018-06-15 DIAGNOSIS — R011 Cardiac murmur, unspecified: Secondary | ICD-10-CM | POA: Diagnosis not present

## 2018-06-15 DIAGNOSIS — I10 Essential (primary) hypertension: Secondary | ICD-10-CM

## 2018-06-15 DIAGNOSIS — R072 Precordial pain: Secondary | ICD-10-CM | POA: Diagnosis not present

## 2018-06-15 DIAGNOSIS — E782 Mixed hyperlipidemia: Secondary | ICD-10-CM

## 2018-06-15 DIAGNOSIS — R9431 Abnormal electrocardiogram [ECG] [EKG]: Secondary | ICD-10-CM

## 2018-06-15 DIAGNOSIS — E0590 Thyrotoxicosis, unspecified without thyrotoxic crisis or storm: Secondary | ICD-10-CM

## 2018-06-15 NOTE — Patient Instructions (Signed)
Medication Instructions:   Your physician recommends that you continue on your current medications as directed. Please refer to the Current Medication list given to you today.  Labwork:  NONE  Testing/Procedures: Your physician has requested that you have an echocardiogram. Echocardiography is a painless test that uses sound waves to create images of your heart. It provides your doctor with information about the size and shape of your heart and how well your heart's chambers and valves are working. This procedure takes approximately one hour. There are no restrictions for this procedure. Your physician has requested that you have a lexiscan myoview. For further information please visit [email or web address]. Please follow instruction sheet, as given.  Follow-Up:  Your physician recommends that you schedule a follow-up appointment in: pending test results.  Any Other Special Instructions Will Be Listed Below (If Applicable).  If you need a refill on your cardiac medications before your next appointment, please call your pharmacy. 

## 2018-06-22 ENCOUNTER — Encounter (HOSPITAL_COMMUNITY): Payer: Self-pay

## 2018-06-22 ENCOUNTER — Encounter (HOSPITAL_BASED_OUTPATIENT_CLINIC_OR_DEPARTMENT_OTHER)
Admission: RE | Admit: 2018-06-22 | Discharge: 2018-06-22 | Disposition: A | Discharge status: Home / Self Care | Payer: Medicare HMO | Source: Ambulatory Visit | Attending: Cardiology | Admitting: Cardiology

## 2018-06-22 ENCOUNTER — Encounter (HOSPITAL_COMMUNITY)
Admission: RE | Admit: 2018-06-22 | Discharge: 2018-06-22 | Disposition: A | Discharge status: Home / Self Care | Payer: Medicare HMO | Source: Ambulatory Visit | Attending: Cardiology | Admitting: Cardiology

## 2018-06-22 ENCOUNTER — Ambulatory Visit (HOSPITAL_COMMUNITY)
Admission: RE | Admit: 2018-06-22 | Discharge: 2018-06-22 | Disposition: A | Discharge status: Home / Self Care | Payer: Medicare HMO | Source: Ambulatory Visit | Attending: Cardiology | Admitting: Cardiology

## 2018-06-22 DIAGNOSIS — I10 Essential (primary) hypertension: Secondary | ICD-10-CM | POA: Insufficient documentation

## 2018-06-22 DIAGNOSIS — Z6839 Body mass index (BMI) 39.0-39.9, adult: Secondary | ICD-10-CM | POA: Diagnosis not present

## 2018-06-22 DIAGNOSIS — R9431 Abnormal electrocardiogram [ECG] [EKG]: Secondary | ICD-10-CM

## 2018-06-22 DIAGNOSIS — R072 Precordial pain: Secondary | ICD-10-CM | POA: Insufficient documentation

## 2018-06-22 DIAGNOSIS — E119 Type 2 diabetes mellitus without complications: Secondary | ICD-10-CM | POA: Diagnosis not present

## 2018-06-22 DIAGNOSIS — R011 Cardiac murmur, unspecified: Secondary | ICD-10-CM | POA: Diagnosis present

## 2018-06-22 DIAGNOSIS — E785 Hyperlipidemia, unspecified: Secondary | ICD-10-CM | POA: Diagnosis not present

## 2018-06-22 DIAGNOSIS — K219 Gastro-esophageal reflux disease without esophagitis: Secondary | ICD-10-CM | POA: Insufficient documentation

## 2018-06-22 MED ORDER — TECHNETIUM TC 99M TETROFOSMIN IV KIT
30.0000 | PACK | Freq: Once | INTRAVENOUS | Status: AC | PRN
  Administered 2018-06-22: 32 via INTRAVENOUS

## 2018-06-22 MED ORDER — REGADENOSON 0.4 MG/5ML IV SOLN
INTRAVENOUS | Status: AC
  Administered 2018-06-22: 0.4 mg via INTRAVENOUS
  Filled 2018-06-22: qty 5

## 2018-06-22 MED ORDER — SODIUM CHLORIDE 0.9% FLUSH
INTRAVENOUS | Status: AC
  Administered 2018-06-22: 10 mL via INTRAVENOUS
  Filled 2018-06-22: qty 10

## 2018-06-22 NOTE — Progress Notes (Signed)
*  PRELIMINARY RESULTS* Echocardiogram 2D Echocardiogram has been performed.  [name] 06/22/2018, 9:30 AM

## 2018-06-23 ENCOUNTER — Other Ambulatory Visit: Payer: Self-pay | Admitting: "Endocrinology

## 2018-06-23 ENCOUNTER — Telehealth: Payer: Self-pay | Admitting: "Endocrinology

## 2018-06-23 ENCOUNTER — Telehealth: Payer: Self-pay

## 2018-06-23 ENCOUNTER — Encounter (HOSPITAL_COMMUNITY): Payer: Self-pay

## 2018-06-23 ENCOUNTER — Encounter (HOSPITAL_COMMUNITY)
Admission: RE | Admit: 2018-06-23 | Discharge: 2018-06-23 | Disposition: A | Discharge status: Home / Self Care | Payer: Medicare HMO | Source: Ambulatory Visit | Attending: Cardiology | Admitting: Cardiology

## 2018-06-23 LAB — NM MYOCAR MULTI W/SPECT W/WALL MOTION / EF
CHL CUP RESTING HR STRESS: 104 {beats}/min
CSEPPHR: 136 {beats}/min
LHR: 0.62
LV dias vol: 91 mL (ref 46–106)
LV sys vol: 42 mL
NUC STRESS TID: 0.9
SDS: 1
SRS: 1
SSS: 2

## 2018-06-23 MED ORDER — TECHNETIUM TC 99M TETROFOSMIN IV KIT
25.0000 | PACK | Freq: Once | INTRAVENOUS | Status: AC | PRN
  Administered 2018-06-23: 26 via INTRAVENOUS

## 2018-06-23 MED ORDER — DULAGLUTIDE 1.5 MG/0.5ML ~~LOC~~ SOAJ: 1.5000 mg | SUBCUTANEOUS | 0 refills | Status: DC

## 2018-06-23 NOTE — Telephone Encounter (Signed)
[name] is stating that she needs Dulaglutide (TRULICITY) 1.5 YH/9.0BP SOPN  Sent in to [facility], please advise?

## 2018-06-23 NOTE — Telephone Encounter (Signed)
-----   Message from Satira Sark, MD sent at 06/23/2018 11:27 AM EDT ----- Results reviewed.  Poor quality study due to image quality.  The abnormalities uncovered could be related to artifact and the test is not overly helpful in terms of risk stratification.  I did review her recent echocardiogram already which shows mild aortic stenosis that is unlikely to be causing any symptoms at this time.  Let's make sure she has a follow-up visit with [place] to see how she is doing clinically.  This may be a scenario where we need to pursue further cardiac evaluation in which case a coronary CT angiogram versus cardiac catheterization might be reasonable. A copy of this test should be forwarded to Stephens Shire, MD.

## 2018-06-23 NOTE — Telephone Encounter (Signed)
Apt scheduled for 07/26/18 at 3 pm with B Strader PA-C, pt's line is NA,NM-cc

## 2018-07-26 ENCOUNTER — Ambulatory Visit: Payer: Medicare HMO | Admitting: Student

## 2018-07-27 LAB — COMPLETE METABOLIC PANEL WITH GFR
AG RATIO: 1.7 (calc) (ref 1.0–2.5)
ALBUMIN MSPROF: 3.8 g/dL (ref 3.6–5.1)
ALT: 14 U/L (ref 6–29)
AST: 21 U/L (ref 10–35)
Alkaline phosphatase (APISO): 60 U/L (ref 33–130)
BILIRUBIN TOTAL: 0.5 mg/dL (ref 0.2–1.2)
BUN: 15 mg/dL (ref 7–25)
CHLORIDE: 107 mmol/L (ref 98–110)
CO2: 30 mmol/L (ref 20–32)
Calcium: 9.3 mg/dL (ref 8.6–10.4)
Creat: 0.66 mg/dL (ref 0.50–0.99)
GFR, EST AFRICAN AMERICAN: 111 mL/min/{1.73_m2} (ref >=60)
GFR, Est Non African American: 96 mL/min/{1.73_m2} (ref >=60)
GLOBULIN: 2.3 g/dL (ref 1.9–3.7)
GLUCOSE: 96 mg/dL (ref 65–99)
POTASSIUM: 4 mmol/L (ref 3.5–5.3)
SODIUM: 142 mmol/L (ref 135–146)
Total Protein: 6.1 g/dL (ref 6.1–8.1)

## 2018-07-27 LAB — HEMOGLOBIN A1C
EAG (MMOL/L): 6 (calc)
HEMOGLOBIN A1C: 5.4 %{Hb} (ref <5.7)
MEAN PLASMA GLUCOSE: 108 (calc)

## 2018-07-27 LAB — TSH: TSH: 0.01 mIU/L — ABNORMAL LOW (ref 0.40–4.50)

## 2018-07-27 LAB — T4, FREE: Free T4: 1.5 ng/dL (ref 0.8–1.8)

## 2018-08-03 ENCOUNTER — Encounter: Payer: Self-pay | Admitting: "Endocrinology

## 2018-08-03 ENCOUNTER — Ambulatory Visit: Payer: Medicare HMO | Admitting: "Endocrinology

## 2018-08-03 VITALS — BP 140/78 | HR 98 | Ht 67.0 in | Wt 248.0 lb

## 2018-08-03 DIAGNOSIS — I10 Essential (primary) hypertension: Secondary | ICD-10-CM | POA: Diagnosis not present

## 2018-08-03 DIAGNOSIS — E0590 Thyrotoxicosis, unspecified without thyrotoxic crisis or storm: Secondary | ICD-10-CM | POA: Diagnosis not present

## 2018-08-03 DIAGNOSIS — Z794 Long term (current) use of insulin: Secondary | ICD-10-CM

## 2018-08-03 DIAGNOSIS — E782 Mixed hyperlipidemia: Secondary | ICD-10-CM | POA: Diagnosis not present

## 2018-08-03 DIAGNOSIS — E118 Type 2 diabetes mellitus with unspecified complications: Secondary | ICD-10-CM | POA: Diagnosis not present

## 2018-08-03 NOTE — Patient Instructions (Signed)

## 2018-08-03 NOTE — Progress Notes (Signed)
Endocrinology follow-up note   Subjective:    Patient ID: [name], female    DOB: Jul 15, 1956. Patient is being seen in f/u for management of type 2 diabetes, hypothyroidism, hyperlipidemia, hypertension. PMD:   Stephens Shire, MD  Past Medical History:  Diagnosis Date  . Anxiety   . Essential hypertension 2009  . GERD (gastroesophageal reflux disease)   . Iron deficiency anemia   . Osteoarthritis   . Palpitations    Tachypalpitations on beta blockers since 2010  . Type 2 diabetes mellitus ([place]) 2003  . Vitamin D deficiency    Past Surgical History:  Procedure Laterality Date  . ABDOMINAL HYSTERECTOMY     History of cervical cancer  . TONSILLECTOMY     Social History   Socioeconomic History  . Marital status: Single    Spouse name: Not on file  . Number of children: 2  . Years of education: Not on file  . Highest education level: Not on file  Occupational History  . Occupation: DISABLED    Employer: UNEMPLOYED    Comment: OSTEOARTHRITIS  Social Needs  . Financial resource strain: Not on file  . Food insecurity:    Worry: Not on file    Inability: Not on file  . Transportation needs:    Medical: Not on file    Non-medical: Not on file  Tobacco Use  . Smoking status: Never Smoker  . Smokeless tobacco: Never Used  Substance and Sexual Activity  . Alcohol use: No  . Drug use: No  . Sexual activity: Never  Lifestyle  . Physical activity:    Days per week: Not on file    Minutes per session: Not on file  . Stress: Not on file  Relationships  . Social connections:    Talks on phone: Not on file    Gets together: Not on file    Attends religious service: Not on file    Active member of club or organization: Not on file    Attends meetings of clubs or organizations: Not on file    Relationship status: Not on file  Other Topics Concern  . Not on file  Social History Narrative   Has 2 grandchildren. 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 Insulin Glargine (LANTUS SOLOSTAR [place]) Inject 20 Units into the skin at bedtime.  . blood glucose meter kit and supplies KIT Dispense based on patient and insurance preference. Use up to four times daily as directed. (FOR ICD-10 E11.65)  . Cholecalciferol (VITAMIN D3) 5000 units TABS Take by mouth.  . Dulaglutide (TRULICITY) 1.5 GE/3.6OQ SOPN Inject 1.5 mg into the skin once a week.  Marland Kitchen FERROUSUL 325 (65 Fe) MG tablet Take 1 tablet by mouth daily.  Marland Kitchen gabapentin (NEURONTIN) 400 MG capsule Take 400 mg by mouth 3 (three) times daily.  Marland Kitchen losartan (COZAAR) 100 MG tablet Take 100 mg by mouth daily.  . magnesium oxide (MAG-OX) 400 MG tablet Take 400 mg by mouth 2 (two) times daily.  . meloxicam (MOBIC) 15 MG tablet Take 15 mg by mouth at bedtime.  . metFORMIN (GLUCOPHAGE) 1000 MG tablet Take 1 tablet (1,000 mg total) by mouth 2 (two) times daily.  . pantoprazole (PROTONIX) 40 MG tablet Take 40 mg by mouth daily.    . pravastatin (PRAVACHOL) 40 MG tablet Take 20 mg by mouth at bedtime.   . [DISCONTINUED] Insulin  Glargine (LANTUS SOLOSTAR) 100 UNIT/ML Solostar Pen Inject 30 Units into the skin at bedtime.   No facility-administered encounter medications on file as of 08/03/2018.    ALLERGIES: Allergies  Allergen Reactions  . Sulfa Antibiotics Nausea And Vomiting    Other reaction(s): GI Upset (intolerance) unknown  . Doxepin Rash and Swelling   VACCINATION STATUS: Immunization History  Administered Date(s) Administered  . Tdap 11/24/2007    Diabetes  She presents for her follow-up diabetic visit. She has type 2 diabetes mellitus. Onset time: She was diagnosed at approximate age of 63 years. Her disease course has been improving. There are no hypoglycemic associated symptoms. Pertinent negatives for hypoglycemia include no confusion, headaches, pallor or seizures. Pertinent negatives for diabetes include no blurred  vision, no chest pain, no fatigue, no polydipsia, no polyphagia and no polyuria. There are no hypoglycemic complications. Symptoms are improving. Diabetic complications include peripheral neuropathy. Risk factors for coronary artery disease include diabetes mellitus, dyslipidemia, hypertension, obesity, family history and sedentary lifestyle. Current diabetic treatment includes intensive insulin program and oral agent (monotherapy). Her weight is decreasing steadily (She has achieved 50 pounds of weight loss since August 2018.). She is following a generally unhealthy diet. When asked about meal planning, she reported none. She has not had a previous visit with a dietitian. She never participates in exercise. Her breakfast blood glucose range is generally 130-140 mg/dl. Her overall blood glucose range is 130-140 mg/dl. (She returns with continued improvement in her glycemic profile with A1c of 5.4%.   ) An ACE inhibitor/angiotensin II receptor blocker is being taken. Eye exam is current.  Hyperlipidemia  This is a chronic problem. The current episode started more than 1 year ago. The problem is controlled. Exacerbating diseases include diabetes and obesity. Pertinent negatives include no chest pain, myalgias or shortness of breath. Current antihyperlipidemic treatment includes statins. Risk factors for coronary artery disease include dyslipidemia, diabetes mellitus, hypertension, obesity, a sedentary lifestyle, family history and post-menopausal.  Hypertension  This is a chronic problem. The current episode started more than 1 year ago. The problem is uncontrolled. Pertinent negatives include no blurred vision, chest pain, headaches, palpitations or shortness of breath. Risk factors for coronary artery disease include diabetes mellitus, dyslipidemia, obesity, family history and sedentary lifestyle. Past treatments include angiotensin blockers.     Review of Systems  Constitutional: Negative for chills,  fatigue, fever and unexpected weight change.  HENT: Negative for trouble swallowing and voice change.   Eyes: Negative for blurred vision and visual disturbance.  Respiratory: Negative for cough, shortness of breath and wheezing.   Cardiovascular: Negative for chest pain, palpitations and leg swelling.  Gastrointestinal: Negative for diarrhea, nausea and vomiting.  Endocrine: Negative for cold intolerance, heat intolerance, polydipsia, polyphagia and polyuria.  Musculoskeletal: Negative for arthralgias and myalgias.  Skin: Negative for color change, pallor, rash and wound.  Neurological: Negative for seizures and headaches.  Psychiatric/Behavioral: Negative for confusion and suicidal ideas.    Objective:    BP 140/78   Pulse 98   Ht '5\' 7"'  (1.702 m)   Wt 248 lb (112.5 kg)   BMI 38.84 kg/m   Wt Readings from Last 3 Encounters:  08/03/18 248 lb (112.5 kg)  06/15/18 249 lb 6.4 oz (113.1 kg)  05/02/18 260 lb (117.9 kg)    Physical Exam  Constitutional: She appears well-developed.  HENT:  Head: Normocephalic and atraumatic.  Edentulous.   Eyes: EOM are normal.  Neck: Normal range of motion. Neck supple. No tracheal  deviation present. No thyromegaly present.  Cardiovascular: Normal rate.  Mild tachycardia.  Pulmonary/Chest: Effort normal.  Abdominal: There is no tenderness. There is no guarding.  Musculoskeletal: Normal range of motion. She exhibits no edema.  Neurological: She is alert. She has normal reflexes. No cranial nerve deficit. Coordination normal.  Skin: Skin is warm and dry. No rash noted. No erythema. No pallor.  Psychiatric: She has a normal mood and affect. Judgment normal.   Recent Results (from the past 2160 hour(s))  NM Myocar Multi W/Spect W/Wall Motion / EF     Status: None   Collection Time: 06/23/18 10:57 AM  Result Value Ref Range   Rest HR 104 bpm   Rest BP 199/104 mmHg   Peak HR 136 bpm   Peak BP 199/104 mmHg   SSS 2    SRS 1    SDS 1    LHR  0.62    TID 0.90    LV sys vol 42 mL   LV dias vol 91 46 - 106 mL  COMPLETE METABOLIC PANEL WITH GFR     Status: None   Collection Time: 07/26/18  8:32 AM  Result Value Ref Range   Glucose, Bld 96 65 - 99 mg/dL    Comment: .            Fasting reference interval .    BUN 15 7 - 25 mg/dL   Creat 0.66 0.50 - 0.99 mg/dL    Comment: For patients >23 years of age, the reference limit for Creatinine is approximately 13% higher for people identified as African-American. .    GFR, Est Non African American 96 > OR = 60 mL/min/1.68m   GFR, Est African American 111 > OR = 60 mL/min/1.718m  BUN/Creatinine Ratio NOT APPLICABLE 6 - 22 (calc)   Sodium 142 135 - 146 mmol/L   Potassium 4.0 3.5 - 5.3 mmol/L   Chloride 107 98 - 110 mmol/L   CO2 30 20 - 32 mmol/L   Calcium 9.3 8.6 - 10.4 mg/dL   Total Protein 6.1 6.1 - 8.1 g/dL   Albumin 3.8 3.6 - 5.1 g/dL   Globulin 2.3 1.9 - 3.7 g/dL (calc)   AG Ratio 1.7 1.0 - 2.5 (calc)   Total Bilirubin 0.5 0.2 - 1.2 mg/dL   Alkaline phosphatase (APISO) 60 33 - 130 U/L   AST 21 10 - 35 U/L   ALT 14 6 - 29 U/L  Hemoglobin A1c     Status: None   Collection Time: 07/26/18  8:32 AM  Result Value Ref Range   Hgb A1c MFr Bld 5.4 <5.7 % of total Hgb    Comment: For the purpose of screening for the presence of diabetes: . <5.7%       Consistent with the absence of diabetes 5.7-6.4%    Consistent with increased risk for diabetes             (prediabetes) > or =6.5%  Consistent with diabetes . This assay result is consistent with a decreased risk of diabetes. . Currently, no consensus exists regarding use of hemoglobin A1c for diagnosis of diabetes in children. . According to American Diabetes Association (ADA) guidelines, hemoglobin A1c <7.0% represents optimal control in non-pregnant diabetic patients. Different metrics may apply to specific patient populations.  Standards of Medical Care in Diabetes(ADA). .    Mean Plasma Glucose 108 (calc)    eAG (mmol/L) 6.0 (calc)  TSH     Status: Abnormal  Collection Time: 07/26/18  8:32 AM  Result Value Ref Range   TSH 0.01 (L) 0.40 - 4.50 mIU/L  T4, free     Status: None   Collection Time: 07/26/18  8:32 AM  Result Value Ref Range   Free T4 1.5 0.8 - 1.8 ng/dL    A1c from a April  4 was 12%.  Lipid Panel     Component Value Date/Time   CHOL 134 02/24/2017   TRIG 87 02/24/2017   HDL 49 02/24/2017   LDLCALC 75 02/24/2017     Assessment & Plan:   1. Type 2 diabetes mellitus with complication, with long-term current use of insulin ([place]) - Patient has currently controlled ype 2 DM since  62 years of age. - She came with continued improvement in her glycemic profile with A1c of 5.4%, progressively improving from 12%.     Recent labs reviewed.   Her diabetes is complicated by obesity/sedentary life, peripheral neuropathy and patient remains at a high risk for more acute and chronic complications which include CAD, CVA, CKD, retinopathy, and neuropathy. These are all discussed in detail with the patient.  - I have counseled the patient on diet management and weight loss, by adopting a carbohydrate restricted/protein rich diet.  -  Suggestion is made for her to avoid simple carbohydrates  from her diet including Cakes, Sweet Desserts / Pastries, Ice Cream, Soda (diet and regular), Sweet Tea, Candies, Chips, Cookies, Store Bought Juices, Alcohol in Excess of  1-2 drinks a day, Artificial Sweeteners, and "Sugar-free" Products. This will help patient to have stable blood glucose profile and potentially avoid unintended weight gain.  - I encouraged the patient to switch to  unprocessed or minimally processed complex starch and increased protein intake (animal or plant source), fruits, and vegetables.  - Patient is advised to stick to a routine mealtimes to eat 3 meals  a day and avoid unnecessary snacks ( to snack only to correct hypoglycemia).    - I have approached patient with the  following individualized plan to manage diabetes and patient agrees:   - I advised her to lower her Lantus to 20  units daily at bedtime , advised to continue to hold  NovoLog for now, continue to monitor blood glucose 2 times daily-before breakfast and at bedtime.   - Patient is warned not to take insulin without proper monitoring per orders. -Adjustment parameters are given for hypo and hyperglycemia in writing. -Patient is encouraged to call clinic for blood glucose levels less than 70 or above 200 mg /dl. - I will continue metformin 1000 mg by mouth twice a day, therapeutically suitable for patient . - I advised her to continue Trulicity to 1.5 mg subcutaneously weekly.    - Patient specific target  A1c;  LDL, HDL, Triglycerides, and  Waist Circumference were discussed in detail.  2) BP/HTN: Her blood pressure is controlled to target.  She is advised to continue her current blood pressure medications including losartan 100 mg p.o. daily.   3) Lipids/HPL: Recent lipid panel showed controlled LDL at 75.  She is advised to continue pravastatin 20 mg p.o. Nightly.  4)  Weight/Diet: CDE Consult has been initiated , exercise, and detailed carbohydrates information provided.  5) history of hyperthyroidism:  -She is status post ablative therapy with I-131 for hyperthyroidism on May 16, 2018.  Her previsit labs are consistent with treatment effect, however not hypothyroid yet.  She will not be initiated on thyroid hormone replacement for now.  6) Chronic Care/Health Maintenance:  -Patient is on ACEI/ARB and Statin medications and encouraged to continue to follow up with Ophthalmology, Podiatrist at least yearly or according to recommendations, and advised to   stay away from smoking. I have recommended yearly flu vaccine and pneumonia vaccination at least every 5 years; moderate intensity exercise for up to 150 minutes weekly; and  sleep for at least 7 hours a day.  - I advised patient to  maintain close follow up with Stephens Shire, MD for primary care needs. - Time spent with the patient: 25 min, of which >50% was spent in reviewing her blood glucose logs , discussing her hypo- and hyper-glycemic episodes, reviewing her current and  previous labs and insulin doses and developing a plan to avoid hypo- and hyper-glycemia. Please refer to Patient Instructions for Blood Glucose Monitoring and Insulin/Medications Dosing Guide"  in media tab for additional information. [name] participated in the discussions, expressed understanding, and voiced agreement with the above plans.  All questions were answered to her satisfaction. she is encouraged to contact clinic should she have any questions or concerns prior to her return visit.   Follow up plan: - Return in about 3 months (around 11/02/2018) for Follow up with Pre-visit Labs, Meter, and Logs.  Glade Lloyd, MD Phone: [phone]  Fax: [phone]  -  This note was partially dictated with voice recognition software. Similar sounding words can be transcribed inadequately or may not  be corrected upon review.  08/03/2018, 4:59 PM

## 2018-08-17 DIAGNOSIS — R079 Chest pain, unspecified: Secondary | ICD-10-CM | POA: Insufficient documentation

## 2018-08-17 DIAGNOSIS — I350 Nonrheumatic aortic (valve) stenosis: Secondary | ICD-10-CM | POA: Insufficient documentation

## 2018-08-17 NOTE — Progress Notes (Signed)
Cardiology Office Note    Date:  08/22/2018   ID:  [name], DOB Aug 08, 1956, MRN [id number]  PCP:  Stephens Shire, MD  Cardiologist: Rozann Lesches, MD EPS: None  Chief Complaint  Patient presents with  . Follow-up    History of Present Illness:  [name] is a 62 y.o. female who was seen for the first time in our office 05/2018 by Dr. Domenic Polite on referral from [place] for evaluation of heart murmur and intermittent thoracic discomfort into her neck and shoulders.  Patient also has hypertension, DM.  2D echo 2012 normal LV function no valvular abnormalities and cardiac monitor in 2012 were PVCs and PACs with single burst of PSVT.  2D echo 06/22/2018 showed normal LV function EF 60 to 65% with mild aortic stenosis.  Nuclear stress test 06/23/2018 poor quality study normal LVEF the only defect in the post stress images moderate sized mild intensity inferior defect unable to distinguish if inferior defect of scar ischemia or artifact due to lack of baseline comparison.  If it is ischemia would be considered mild to moderate.  Dr. Domenic Polite wanted her to be seen back to see how she is doing clinically.  If she still having symptoms may need to pursue coronary CTA versus cardiac catheterization to assess further.  Patient comes in for f/u. Most her symptoms are related to stress. Feels like someone grabs her spine and goes into her neck or lower back. Usually when upset and eases within 20-30 min. Occasionally occurs without stress. Walks some and no symptoms with exercise. Has lost 50 lbs in past year. She's tried to reduce her stress but still having some symptoms.Occurs at least once a day.Raising 37 yo grandson and 25 yo grand nephew. Says she had an allergic reaction to dye with CT 1985-became hot all over.    Past Medical History:  Diagnosis Date  . Anxiety   . Essential hypertension 2009  . GERD (gastroesophageal reflux disease)   . Iron deficiency anemia   .  Osteoarthritis   . Palpitations    Tachypalpitations on beta blockers since 2010  . Type 2 diabetes mellitus ([place]) 2003  . Vitamin D deficiency     Past Surgical History:  Procedure Laterality Date  . ABDOMINAL HYSTERECTOMY     History of cervical cancer  . TONSILLECTOMY      Current Medications: Current Meds  Medication Sig  . blood glucose meter kit and supplies KIT Dispense based on patient and insurance preference. Use up to four times daily as directed. (FOR ICD-10 E11.65)  . Cholecalciferol (VITAMIN D3) 5000 units TABS Take by mouth.  . Dulaglutide (TRULICITY) 1.5 LN/9.8XQ SOPN Inject 1.5 mg into the skin once a week.  Marland Kitchen FERROUSUL 325 (65 Fe) MG tablet Take 1 tablet by mouth daily.  Marland Kitchen gabapentin (NEURONTIN) 400 MG capsule Take 400 mg by mouth 3 (three) times daily.  . Insulin Glargine (LANTUS SOLOSTAR [place]) Inject 20 Units into the skin at bedtime.  Marland Kitchen losartan (COZAAR) 100 MG tablet Take 100 mg by mouth daily.  . magnesium oxide (MAG-OX) 400 MG tablet Take 400 mg by mouth 2 (two) times daily.  . meloxicam (MOBIC) 15 MG tablet Take 15 mg by mouth at bedtime.  . metFORMIN (GLUCOPHAGE) 1000 MG tablet Take 1 tablet (1,000 mg total) by mouth 2 (two) times daily.  . pantoprazole (PROTONIX) 40 MG tablet Take 40 mg by mouth daily.    . pravastatin (PRAVACHOL) 40 MG  tablet Take 20 mg by mouth at bedtime.      Allergies:   Sulfa antibiotics and Doxepin   Social History   Socioeconomic History  . Marital status: Single    Spouse name: Not on file  . Number of children: 2  . Years of education: Not on file  . Highest education level: Not on file  Occupational History  . Occupation: DISABLED    Employer: UNEMPLOYED    Comment: [employer]  Social Needs  . Financial resource strain: Not on file  . Food insecurity:    Worry: Not on file    Inability: Not on file  . Transportation needs:    Medical: Not on file    Non-medical: Not on file  Tobacco Use  . Smoking status:  Never Smoker  . Smokeless tobacco: Never Used  Substance and Sexual Activity  . Alcohol use: No  . Drug use: No  . Sexual activity: Never  Lifestyle  . Physical activity:    Days per week: Not on file    Minutes per session: Not on file  . Stress: Not on file  Relationships  . Social connections:    Talks on phone: Not on file    Gets together: Not on file    Attends religious service: Not on file    Active member of club or organization: Not on file    Attends meetings of clubs or organizations: Not on file    Relationship status: Not on file  Other Topics Concern  . Not on file  Social History Narrative   Has 2 grandchildren. 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   ROS:   Please see the history of present illness.    Review of Systems  Constitution: Positive for weight loss.  Cardiovascular: Positive for chest pain.  Psychiatric/Behavioral: The patient is nervous/anxious.    All other systems reviewed and are negative.   PHYSICAL EXAM:   VS:  BP (!) 144/76   Pulse 81   Ht 5' 8" (1.727 m)   Wt 250 lb (113.4 kg)   SpO2 97%   BMI 38.01 kg/m   Physical Exam  GEN: Obese, in no acute distress  Neck: Murmur portrayed in the carotids, no JVD, carotid bruits, or masses Cardiac:RRR; 2/6 sys murmur LSB Respiratory:  clear to auscultation bilaterally, normal work of breathing GI: soft, nontender, nondistended, + BS Ext: without cyanosis, clubbing, or edema, Good distal pulses bilaterally Neuro:  Alert and Oriented x 3 Psych: euthymic mood, full affect  Wt Readings from Last 3 Encounters:  08/22/18 250 lb (113.4 kg)  08/03/18 248 lb (112.5 kg)  06/15/18 249 lb 6.4 oz (113.1 kg)      Studies/Labs Reviewed:   EKG:  EKG is not ordered today.   Recent Labs: 07/26/2018:  ALT 14; BUN 15; Creat 0.66; Potassium 4.0; Sodium 142; TSH 0.01   Lipid Panel    Component Value Date/Time   CHOL 134 02/24/2017   TRIG 87 02/24/2017   HDL 49 02/24/2017   [place] 75 02/24/2017    Additional studies/ records that were reviewed today include:  Nuclear stress test 06/23/2018 There was no ST segment deviation noted during stress. The left ventricular ejection fraction is normal (55-65%). Poor quality study. Overall poor  radiotracer uptake in the resting images with resulting diffuse defects. The only defect in the post-stress images is a moderate sized mild intensity inferior defect. Unable to distinguish if this is inferior defect is scar, ischemia, or artifact due to lack of baseline comparison. Worst case scenario if it is ischemia would be considered mild to moderate corresponding with low to intermediate risk Technically limited study. Consider alternative modality if high concern for CAD.   2D echo 06/22/2018 study Conclusions   - Left ventricle: The cavity size was normal. Wall thickness was   increased in a pattern of moderate LVH. Systolic function was   normal. The estimated ejection fraction was in the range of 60%   to 65%. Wall motion was normal; there were no regional wall   motion abnormalities. - Aortic valve: Mildly calcified annulus. Trileaflet; mildly   thickened leaflets. There was mild stenosis. Valve area (VTI):   1.78 cm^2. Valve area (Vmax): 1.58 cm^2. Valve area (Vmean): 1.93   cm^2. - Mitral valve: Mildly calcified annulus. Mildly thickened leaflets   . There was mild regurgitation. - Left atrium: The atrium was moderately dilated. - Atrial septum: No defect or patent foramen ovale was identified. - Technically adequate study.       ASSESSMENT:    1. Chest pain, unspecified type   2. Mild aortic stenosis   3. Essential hypertension   4. Mixed hyperlipidemia   5. Morbid obesity ([place])   6. Type 2 diabetes mellitus with complication,  with long-term current use of insulin (HCC)      PLAN:  In order of problems listed above:  Chest pain with very poor quality nuclear stress test 06/23/2018.  Chest pain described as a gripping in the back of her spine going up into her neck or down into her hips.  Mostly occurs with stress.  Multiple CV risk factors.  She is willing to proceed with coronary CTA.  She did have a reaction to CT scan in 1985 feeling hot all over.  Will premedicate for dye allergy.  We will also try low-dose Imdur 30 mg once daily to see if this helps.  Follow-up with myself or Dr. Domenic Polite after testing done.  Mild aortic stenosis on 2D echo 06/22/2018 to be followed as an outpatient  Essential hypertension blood pressure stable  Mixed hyperlipidemia on Pravachol  Morbid obesity has lost 50 pounds in the past year due to thyroid  Type II DM on insulin    Medication Adjustments/Labs and Tests Ordered: Current medicines are reviewed at length with the patient today.  Concerns regarding medicines are outlined above.  Medication changes, Labs and Tests ordered today are listed in the Patient Instructions below. Patient Instructions  Medication Instructions:  Your physician recommends that you continue on your current medications as directed. Please refer to the Current Medication list given to you today.   Labwork: Your physician recommends that you return for lab work 1 week before CT is done.    Testing/Procedures:   Follow-Up: Your physician recommends that you schedule a follow-up appointment in: 1 Month    Any Other Special Instructions Will Be Listed Below (If Applicable).     If you need a refill on your cardiac medications before your next appointment, please call your pharmacy.  Please arrive at the [facility] main entrance of [facility] at xx:xx AM (30-45 minutes prior to test start time)  [facility] [address] [place], [place] [address] [phone]  Proceed to the [facility] Radiology Department (First Floor).  Please follow these instructions carefully (unless otherwise directed):  Hold all erectile dysfunction medications at least 48 hours prior to test.  On the Night Before the Test: . Be sure to Drink plenty of water. . Do not consume any caffeinated/decaffeinated beverages or chocolate 12 hours prior to your test. . Do not take any antihistamines 12 hours prior to your test. . If you take Metformin do not take 24 hours prior to test. . If the patient has contrast allergy: ? Patient will need a prescription for Prednisone and very clear instructions (as follows): 1. Prednisone 50 mg - take 13 hours prior to test 2. Take another Prednisone 50 mg 7 hours prior to test 3. Take another Prednisone 50 mg 1 hour prior to test 4. Take Benadryl 50 mg 1 hour prior to test ( Buy over the counter and take 2)  . Patient must complete all four doses of above prophylactic medications. . Patient will need a ride after test due to Benadryl.  On the Day of the Test: . Drink plenty of water. Do not drink any water within one hour of the test. . Do not eat any food 4 hours prior to the test. . You may take your regular medications prior to the test. . IF NOT ON BETA BLOCKER-Take 50 mg of Lopressor (metoprolol) one hour before test . HOLD Furosemide morning of the test. . Do not take Imdur the morning of the test. . Do not take Metformin or Insulin the morning of your test.   After the Test: . Drink plenty of water. . After receiving IV contrast, you may experience a mild flushed feeling. This is normal. . On occasion, you may experience a mild rash up to 24 hours after the test. This is not dangerous. If this occurs, you can take Benadryl 25 mg and increase your fluid intake. . If you experience trouble breathing, this can be serious. If it is severe call 911 IMMEDIATELY. If it is mild, please call our office. . If you take any of  these medications: Glipizide/Metformin, Avandament, Glucavance, please do not take 48 hours after completing test.     Signed, Ermalinda Barrios, PA-C  08/22/2018 1:33 PM    [facility] [place], [place], [place]  [address] Phone: [phone]; Fax: [phone]

## 2018-08-22 ENCOUNTER — Encounter: Payer: Self-pay | Admitting: Physician Assistant

## 2018-08-22 ENCOUNTER — Ambulatory Visit: Payer: Medicare HMO | Admitting: Physician Assistant

## 2018-08-22 VITALS — BP 144/76 | HR 81 | Ht 68.0 in | Wt 250.0 lb

## 2018-08-22 DIAGNOSIS — I10 Essential (primary) hypertension: Secondary | ICD-10-CM | POA: Diagnosis not present

## 2018-08-22 DIAGNOSIS — E782 Mixed hyperlipidemia: Secondary | ICD-10-CM | POA: Diagnosis not present

## 2018-08-22 DIAGNOSIS — I350 Nonrheumatic aortic (valve) stenosis: Secondary | ICD-10-CM

## 2018-08-22 DIAGNOSIS — E118 Type 2 diabetes mellitus with unspecified complications: Secondary | ICD-10-CM

## 2018-08-22 DIAGNOSIS — R079 Chest pain, unspecified: Secondary | ICD-10-CM

## 2018-08-22 DIAGNOSIS — Z794 Long term (current) use of insulin: Secondary | ICD-10-CM

## 2018-08-22 MED ORDER — METOPROLOL TARTRATE 50 MG PO TABS: ORAL_TABLET | ORAL | 0 refills | Status: DC

## 2018-08-22 MED ORDER — PREDNISONE 50 MG PO TABS: ORAL_TABLET | ORAL | 0 refills | Status: DC

## 2018-08-22 MED ORDER — ISOSORBIDE MONONITRATE ER 30 MG PO TB24: 30.0000 mg | ORAL_TABLET | Freq: Every day | ORAL | 3 refills | Status: DC

## 2018-08-22 NOTE — Patient Instructions (Addendum)
Medication Instructions:  Your physician recommends that you continue on your current medications as directed. Please refer to the Current Medication list given to you today.   Labwork: Your physician recommends that you return for lab work 1 week before CT is done.    Testing/Procedures:   Follow-Up: Your physician recommends that you schedule a follow-up appointment in: 1 Month    Any Other Special Instructions Will Be Listed Below (If Applicable).     If you need a refill on your cardiac medications before your next appointment, please call your pharmacy.  Please arrive at the [facility] main entrance of [facility] at xx:xx AM (30-45 minutes prior to test start time)  [facility] [place], [place] [address] [phone]  Proceed to the [facility] Radiology Department (First Floor).  Please follow these instructions carefully (unless otherwise directed):  Hold all erectile dysfunction medications at least 48 hours prior to test.  On the Night Before the Test: . Be sure to Drink plenty of water. . Do not consume any caffeinated/decaffeinated beverages or chocolate 12 hours prior to your test. . Do not take any antihistamines 12 hours prior to your test. . If you take Metformin do not take 24 hours prior to test. . If the patient has contrast allergy: ? Patient will need a prescription for Prednisone and very clear instructions (as follows): 1. Prednisone 50 mg - take 13 hours prior to test 2. Take another Prednisone 50 mg 7 hours prior to test 3. Take another Prednisone 50 mg 1 hour prior to test 4. Take Benadryl 50 mg 1 hour prior to test ( Buy over the counter and take 2)  . Patient must complete all four doses of above prophylactic medications. . Patient will need a ride after test due to Benadryl.  On the Day of the Test: . Drink plenty of water. Do not drink any water within one hour of the test. . Do not eat any food 4  hours prior to the test. . You may take your regular medications prior to the test. . IF NOT ON BETA BLOCKER-Take 50 mg of Lopressor (metoprolol) one hour before test . HOLD Furosemide morning of the test. . Do not take Imdur the morning of the test. . Do not take Metformin or Insulin the morning of your test.   After the Test: . Drink plenty of water. . After receiving IV contrast, you may experience a mild flushed feeling. This is normal. . On occasion, you may experience a mild rash up to 24 hours after the test. This is not dangerous. If this occurs, you can take Benadryl 25 mg and increase your fluid intake. . If you experience trouble breathing, this can be serious. If it is severe call 911 IMMEDIATELY. If it is mild, please call our office. . If you take any of these medications: Glipizide/Metformin, Avandament, Glucavance, please do not take 48 hours after completing test.

## 2018-09-08 ENCOUNTER — Other Ambulatory Visit: Payer: Self-pay | Admitting: "Endocrinology

## 2018-09-11 ENCOUNTER — Inpatient Hospital Stay (HOSPITAL_COMMUNITY)
Admission: EM | Admit: 2018-09-11 | Discharge: 2018-09-14 | DRG: 372 | Disposition: A | Discharge status: Home / Self Care | Payer: Medicare HMO | Attending: Internal Medicine | Admitting: Internal Medicine

## 2018-09-11 ENCOUNTER — Encounter (HOSPITAL_COMMUNITY): Payer: Self-pay | Admitting: Emergency Medicine

## 2018-09-11 ENCOUNTER — Other Ambulatory Visit: Payer: Self-pay

## 2018-09-11 ENCOUNTER — Emergency Department (HOSPITAL_COMMUNITY): Payer: Medicare HMO

## 2018-09-11 DIAGNOSIS — E782 Mixed hyperlipidemia: Secondary | ICD-10-CM | POA: Diagnosis present

## 2018-09-11 DIAGNOSIS — E118 Type 2 diabetes mellitus with unspecified complications: Secondary | ICD-10-CM

## 2018-09-11 DIAGNOSIS — E876 Hypokalemia: Secondary | ICD-10-CM | POA: Diagnosis present

## 2018-09-11 DIAGNOSIS — R197 Diarrhea, unspecified: Secondary | ICD-10-CM

## 2018-09-11 DIAGNOSIS — Z794 Long term (current) use of insulin: Secondary | ICD-10-CM

## 2018-09-11 DIAGNOSIS — A020 Salmonella enteritis: Secondary | ICD-10-CM | POA: Diagnosis not present

## 2018-09-11 DIAGNOSIS — K219 Gastro-esophageal reflux disease without esophagitis: Secondary | ICD-10-CM | POA: Diagnosis not present

## 2018-09-11 DIAGNOSIS — I10 Essential (primary) hypertension: Secondary | ICD-10-CM | POA: Diagnosis present

## 2018-09-11 DIAGNOSIS — E871 Hypo-osmolality and hyponatremia: Secondary | ICD-10-CM | POA: Diagnosis present

## 2018-09-11 DIAGNOSIS — Z8249 Family history of ischemic heart disease and other diseases of the circulatory system: Secondary | ICD-10-CM

## 2018-09-11 DIAGNOSIS — Z79899 Other long term (current) drug therapy: Secondary | ICD-10-CM

## 2018-09-11 DIAGNOSIS — K529 Noninfective gastroenteritis and colitis, unspecified: Secondary | ICD-10-CM | POA: Diagnosis not present

## 2018-09-11 DIAGNOSIS — E860 Dehydration: Secondary | ICD-10-CM | POA: Diagnosis present

## 2018-09-11 DIAGNOSIS — R112 Nausea with vomiting, unspecified: Secondary | ICD-10-CM

## 2018-09-11 DIAGNOSIS — Z8541 Personal history of malignant neoplasm of cervix uteri: Secondary | ICD-10-CM

## 2018-09-11 DIAGNOSIS — E785 Hyperlipidemia, unspecified: Secondary | ICD-10-CM | POA: Diagnosis present

## 2018-09-11 DIAGNOSIS — Z833 Family history of diabetes mellitus: Secondary | ICD-10-CM

## 2018-09-11 DIAGNOSIS — Z90710 Acquired absence of both cervix and uterus: Secondary | ICD-10-CM

## 2018-09-11 DIAGNOSIS — E1142 Type 2 diabetes mellitus with diabetic polyneuropathy: Secondary | ICD-10-CM | POA: Diagnosis present

## 2018-09-11 DIAGNOSIS — R1110 Vomiting, unspecified: Secondary | ICD-10-CM | POA: Diagnosis present

## 2018-09-11 LAB — CBC WITH DIFFERENTIAL/PLATELET
Abs Immature Granulocytes: 0.03 10*3/uL (ref 0.00–0.07)
Basophils Absolute: 0 10*3/uL (ref 0.0–0.1)
Basophils Relative: 0 %
EOS ABS: 0.2 10*3/uL (ref 0.0–0.5)
Eosinophils Relative: 2 %
HCT: 44.7 % (ref 36.0–46.0)
HEMOGLOBIN: 13.7 g/dL (ref 12.0–15.0)
IMMATURE GRANULOCYTES: 0 %
LYMPHS ABS: 0.8 10*3/uL (ref 0.7–4.0)
LYMPHS PCT: 10 %
MCH: 24.2 pg — ABNORMAL LOW (ref 26.0–34.0)
MCHC: 30.6 g/dL (ref 30.0–36.0)
MCV: 79.1 fL — ABNORMAL LOW (ref 80.0–100.0)
MONOS PCT: 7 %
Monocytes Absolute: 0.5 10*3/uL (ref 0.1–1.0)
NEUTROS ABS: 6.3 10*3/uL (ref 1.7–7.7)
NEUTROS PCT: 81 %
Platelets: 165 10*3/uL (ref 150–400)
RBC: 5.65 MIL/uL — ABNORMAL HIGH (ref 3.87–5.11)
RDW: 14.6 % (ref 11.5–15.5)
WBC: 7.8 10*3/uL (ref 4.0–10.5)
nRBC: 0 % (ref 0.0–0.2)

## 2018-09-11 LAB — COMPREHENSIVE METABOLIC PANEL
ALT: 24 U/L (ref 0–44)
AST: 47 U/L — ABNORMAL HIGH (ref 15–41)
Albumin: 3.6 g/dL (ref 3.5–5.0)
Alkaline Phosphatase: 57 U/L (ref 38–126)
Anion gap: 12 (ref 5–15)
BUN: 25 mg/dL — ABNORMAL HIGH (ref 6–20)
CHLORIDE: 97 mmol/L — AB (ref 98–111)
CO2: 21 mmol/L — ABNORMAL LOW (ref 22–32)
CREATININE: 1 mg/dL (ref 0.44–1.00)
Calcium: 8.6 mg/dL — ABNORMAL LOW (ref 8.9–10.3)
GFR, EST NON AFRICAN AMERICAN: 60 mL/min — AB (ref >60)
Glucose, Bld: 184 mg/dL — ABNORMAL HIGH (ref 70–99)
POTASSIUM: 2.8 mmol/L — AB (ref 3.5–5.1)
Sodium: 130 mmol/L — ABNORMAL LOW (ref 135–145)
Total Bilirubin: 1.2 mg/dL (ref 0.3–1.2)
Total Protein: 7.7 g/dL (ref 6.5–8.1)

## 2018-09-11 LAB — URINALYSIS, ROUTINE W REFLEX MICROSCOPIC
Bacteria, UA: NONE SEEN
Bilirubin Urine: NEGATIVE
GLUCOSE, UA: NEGATIVE mg/dL
Ketones, ur: 20 mg/dL — AB
Leukocytes, UA: NEGATIVE
Nitrite: NEGATIVE
PROTEIN: 30 mg/dL — AB
Specific Gravity, Urine: 1.026 (ref 1.005–1.030)
pH: 5 (ref 5.0–8.0)

## 2018-09-11 LAB — TROPONIN I: Troponin I: <0.03 ng/mL (ref <0.03)

## 2018-09-11 LAB — C DIFFICILE QUICK SCREEN W PCR REFLEX
C DIFFICILE (CDIFF) TOXIN: NEGATIVE
C DIFFICLE (CDIFF) ANTIGEN: NEGATIVE
C Diff interpretation: NOT DETECTED

## 2018-09-11 LAB — MAGNESIUM: MAGNESIUM: 1.7 mg/dL (ref 1.7–2.4)

## 2018-09-11 LAB — GLUCOSE, CAPILLARY
GLUCOSE-CAPILLARY: 113 mg/dL — AB (ref 70–99)
Glucose-Capillary: 129 mg/dL — ABNORMAL HIGH (ref 70–99)

## 2018-09-11 LAB — LIPASE, BLOOD: LIPASE: 45 U/L (ref 11–51)

## 2018-09-11 MED ORDER — ACETAMINOPHEN 650 MG RE SUPP: 650.0000 mg | Freq: Four times a day (QID) | RECTAL | Status: DC | PRN

## 2018-09-11 MED ORDER — PANTOPRAZOLE SODIUM 40 MG PO TBEC
40.0000 mg | DELAYED_RELEASE_TABLET | Freq: Every day | ORAL | Status: DC
  Administered 2018-09-11 – 2018-09-14 (×4): 40 mg via ORAL
  Filled 2018-09-11 (×4): qty 1

## 2018-09-11 MED ORDER — ONDANSETRON HCL 4 MG PO TABS: 4.0000 mg | ORAL_TABLET | Freq: Four times a day (QID) | ORAL | Status: DC | PRN

## 2018-09-11 MED ORDER — ACETAMINOPHEN 325 MG PO TABS
650.0000 mg | ORAL_TABLET | Freq: Four times a day (QID) | ORAL | Status: DC | PRN
  Administered 2018-09-12 (×2): 650 mg via ORAL
  Filled 2018-09-11 (×2): qty 2

## 2018-09-11 MED ORDER — PRAVASTATIN SODIUM 10 MG PO TABS
20.0000 mg | ORAL_TABLET | Freq: Every day | ORAL | Status: DC
  Administered 2018-09-11 – 2018-09-14 (×4): 20 mg via ORAL
  Filled 2018-09-11 (×4): qty 2

## 2018-09-11 MED ORDER — SODIUM CHLORIDE 0.9 % IV BOLUS
500.0000 mL | Freq: Once | INTRAVENOUS | Status: AC
  Administered 2018-09-11: 10:00:00 via INTRAVENOUS

## 2018-09-11 MED ORDER — ISOSORBIDE MONONITRATE ER 60 MG PO TB24
30.0000 mg | ORAL_TABLET | Freq: Every day | ORAL | Status: DC
  Administered 2018-09-12 – 2018-09-14 (×3): 30 mg via ORAL
  Filled 2018-09-11 (×3): qty 1

## 2018-09-11 MED ORDER — SODIUM CHLORIDE 0.9 % IV SOLN
INTRAVENOUS | Status: DC
  Administered 2018-09-11: 18:00:00 via INTRAVENOUS

## 2018-09-11 MED ORDER — POTASSIUM CHLORIDE CRYS ER 20 MEQ PO TBCR
40.0000 meq | EXTENDED_RELEASE_TABLET | Freq: Once | ORAL | Status: AC
  Administered 2018-09-11: 40 meq via ORAL
  Filled 2018-09-11: qty 2

## 2018-09-11 MED ORDER — INSULIN ASPART 100 UNIT/ML ~~LOC~~ SOLN
0.0000 [IU] | Freq: Three times a day (TID) | SUBCUTANEOUS | Status: DC
  Administered 2018-09-11: 1 [IU] via SUBCUTANEOUS

## 2018-09-11 MED ORDER — INSULIN ASPART 100 UNIT/ML ~~LOC~~ SOLN: 0.0000 [IU] | Freq: Every day | SUBCUTANEOUS | Status: DC

## 2018-09-11 MED ORDER — ENOXAPARIN SODIUM 40 MG/0.4ML ~~LOC~~ SOLN
40.0000 mg | SUBCUTANEOUS | Status: DC
  Administered 2018-09-12 – 2018-09-14 (×3): 40 mg via SUBCUTANEOUS
  Filled 2018-09-11 (×3): qty 0.4

## 2018-09-11 MED ORDER — POTASSIUM CHLORIDE IN NACL 40-0.9 MEQ/L-% IV SOLN
INTRAVENOUS | Status: DC
  Administered 2018-09-11: 125 mL/h via INTRAVENOUS
  Filled 2018-09-11 (×4): qty 1000

## 2018-09-11 MED ORDER — INSULIN GLARGINE 100 UNIT/ML ~~LOC~~ SOLN
20.0000 [IU] | Freq: Every day | SUBCUTANEOUS | Status: DC
  Administered 2018-09-11 – 2018-09-12 (×2): 20 [IU] via SUBCUTANEOUS
  Filled 2018-09-11 (×4): qty 0.2

## 2018-09-11 MED ORDER — LOSARTAN POTASSIUM 50 MG PO TABS
100.0000 mg | ORAL_TABLET | Freq: Every day | ORAL | Status: DC
  Administered 2018-09-11 – 2018-09-14 (×4): 100 mg via ORAL
  Filled 2018-09-11 (×4): qty 2

## 2018-09-11 MED ORDER — GABAPENTIN 400 MG PO CAPS
400.0000 mg | ORAL_CAPSULE | Freq: Three times a day (TID) | ORAL | Status: DC
  Administered 2018-09-11 – 2018-09-14 (×9): 400 mg via ORAL
  Filled 2018-09-11 (×9): qty 1

## 2018-09-11 MED ORDER — SODIUM CHLORIDE 0.9 % IV SOLN
INTRAVENOUS | Status: DC
  Administered 2018-09-11: 11:00:00 via INTRAVENOUS

## 2018-09-11 MED ORDER — ONDANSETRON HCL 4 MG/2ML IJ SOLN: 4.0000 mg | Freq: Four times a day (QID) | INTRAMUSCULAR | Status: DC | PRN

## 2018-09-11 MED ORDER — MAGNESIUM SULFATE 2 GM/50ML IV SOLN
2.0000 g | Freq: Once | INTRAVENOUS | Status: AC
  Administered 2018-09-11: 2 g via INTRAVENOUS
  Filled 2018-09-11: qty 50

## 2018-09-11 MED ORDER — SODIUM CHLORIDE 0.9 % IV BOLUS
1000.0000 mL | Freq: Once | INTRAVENOUS | Status: AC
  Administered 2018-09-11: 1000 mL via INTRAVENOUS

## 2018-09-11 NOTE — ED Notes (Signed)
Pt returned from xray

## 2018-09-11 NOTE — H&P (Signed)
History and Physical    [name] ZSM:[id number] DOB: 1955-11-27 DOA: 09/11/2018  PCP: Stephens Shire, MD  Patient coming from: Home  I have personally briefly reviewed patient's old medical records in [place]  Chief Complaint: Diarrhea and vomiting  HPI: [name] is a 62 y.o. female with medical history significant of hypertension, diabetes, hyperlipidemia, presents to the hospital with complaints of vomiting and diarrhea that began on Thursday.  She has had persistent loose stools which are green in color at that time.  She is also had persistent vomiting.  She is been able to keep down water, but no significant solid foods.  She has not had any fevers.  She is feeling increasingly weak and tired.  She is dizzy on standing.  She has not had any chest pain or shortness of breath.  She has not noticed any blood in her stools.  She reports that her grandson is starting to get sick, but no other sick contacts.  ED Course: She is noted to be tachycardic and significantly orthostatic.  She is hyponatremic/hypokalemic.  She is had persistent stools in the emergency room.  There is concern that she will not be able to keep up her hydration needs with oral fluid intake.  Review of Systems: As per HPI otherwise 10 point review of systems negative.    Past Medical History:  Diagnosis Date  . Anxiety   . Essential hypertension 2009  . GERD (gastroesophageal reflux disease)   . Iron deficiency anemia   . Osteoarthritis   . Palpitations    Tachypalpitations on beta blockers since 2010  . Type 2 diabetes mellitus ([place]) 2003  . Vitamin D deficiency     Past Surgical History:  Procedure Laterality Date  . ABDOMINAL HYSTERECTOMY     History of cervical cancer  . TONSILLECTOMY      Social History:  reports that she has never smoked. She has never used smokeless tobacco. She reports that she does not drink alcohol or use drugs.  Allergies  Allergen Reactions  . Sulfa  Antibiotics Nausea And Vomiting    Other reaction(s): GI Upset (intolerance) unknown  . Doxepin Rash and Swelling    Family History  Problem Relation Age of Onset  . Diabetes Father   . Hypertension Father   . Heart failure Mother   . Stroke Sister 50     Prior to Admission medications   Medication Sig Start Date End Date Taking? Authorizing Provider  Cholecalciferol (VITAMIN D3) 5000 units TABS Take 1 tablet by mouth daily.    Yes [provider]  FERROUSUL 325 (65 Fe) MG tablet Take 1 tablet by mouth daily. 05/28/18  Yes [provider]  gabapentin (NEURONTIN) 400 MG capsule Take 400 mg by mouth 3 (three) times daily. 11/24/14  Yes [provider]  Insulin Glargine (LANTUS SOLOSTAR [place]) Inject 20 Units into the skin at bedtime.   Yes [provider]  isosorbide mononitrate (IMDUR) 30 MG 24 hr tablet Take 1 tablet (30 mg total) by mouth daily. 08/22/18 11/20/18 Yes Imogene Burn, PA-C  losartan (COZAAR) 100 MG tablet Take 100 mg by mouth daily. 09/21/14  Yes [provider]  magnesium oxide (MAG-OX) 400 MG tablet Take 400 mg by mouth 2 (two) times daily.   Yes [provider]  meloxicam (MOBIC) 15 MG tablet Take 15 mg by mouth at bedtime. 10/26/14  Yes [provider]  metFORMIN (GLUCOPHAGE) 1000 MG tablet Take 1  tablet (1,000 mg total) by mouth 2 (two) times daily. 06/06/18  Yes Cassandria Anger, MD  pantoprazole (PROTONIX) 40 MG tablet Take 40 mg by mouth daily.     Yes [provider]  pravastatin (PRAVACHOL) 20 MG tablet Take 20 mg by mouth daily.   Yes [provider]  TRULICITY 1.5 WN/0.2VO SOPN INJECT  1.5MG  INTO  THE  SKIN EVERY WEEK 09/08/18  Yes Nida, Marella Chimes, MD  blood glucose meter kit and supplies KIT Dispense based on patient and insurance preference. Use up to four times daily as directed. (FOR ICD-10 E11.65) 08/20/17   Cassandria Anger, MD  metoprolol tartrate (LOPRESSOR) 50 MG  tablet Take one hour before your test 08/22/18   Imogene Burn, PA-C  predniSONE (DELTASONE) 50 MG tablet Take 1 Tablet 13 hours prior to test, another 7 hours prior to test , and the last 1 hour prior to test 08/22/18   Imogene Burn, PA-C    Physical Exam: Vitals:   09/11/18 1330 09/11/18 1415 09/11/18 1428 09/11/18 1456  BP: 116/70   130/80  Pulse: (!) 112 (!) 110 (!) 111 (!) 113  Resp: (!) 26 17 (!) 26   Temp:    98.3 F (36.8 C)  TempSrc:      SpO2: 93% 96% 94% 97%  Weight:      Height:        Constitutional: NAD, calm, comfortable Eyes: PERRL, lids and conjunctivae normal ENMT: Mucous membranes are moist. Posterior pharynx clear of any exudate or lesions.Normal dentition.  Neck: normal, supple, no masses, no thyromegaly Respiratory: clear to auscultation bilaterally, no wheezing, no crackles. Normal respiratory effort. No accessory muscle use.  Cardiovascular: Regular rate and rhythm, no murmurs / rubs / gallops. No extremity edema. 2+ pedal pulses. No carotid bruits.  Abdomen: no tenderness, no masses palpated. No hepatosplenomegaly. Bowel sounds positive.  Musculoskeletal: no clubbing / cyanosis. No joint deformity upper and lower extremities. Good ROM, no contractures. Normal muscle tone.  Skin: no rashes, lesions, ulcers. No induration Neurologic: CN 2-12 grossly intact. Sensation intact, DTR normal. Strength 5/5 in all 4.  Psychiatric: Normal judgment and insight. Alert and oriented x 3. Normal mood.    Labs on Admission: I have personally reviewed following labs and imaging studies  CBC: Recent Labs  Lab 09/11/18 1016  WBC 7.8  NEUTROABS 6.3  HGB 13.7  HCT 44.7  MCV 79.1*  PLT 536   Basic Metabolic Panel: Recent Labs  Lab 09/11/18 1016  NA 130*  K 2.8*  CL 97*  CO2 21*  GLUCOSE 184*  BUN 25*  CREATININE 1.00  CALCIUM 8.6*  MG 1.7   GFR: Estimated Creatinine Clearance: 79.1 mL/min (by C-G formula based on SCr of 1 mg/dL). Liver Function  Tests: Recent Labs  Lab 09/11/18 1016  AST 47*  ALT 24  ALKPHOS 57  BILITOT 1.2  PROT 7.7  ALBUMIN 3.6   Recent Labs  Lab 09/11/18 1016  LIPASE 45   No results for input(s): AMMONIA in the last 168 hours. Coagulation Profile: No results for input(s): INR, PROTIME in the last 168 hours. Cardiac Enzymes: Recent Labs  Lab 09/11/18 1016  TROPONINI <0.03   BNP (last 3 results) No results for input(s): PROBNP in the last 8760 hours. HbA1C: No results for input(s): HGBA1C in the last 72 hours. CBG: No results for input(s): GLUCAP in the last 168 hours. Lipid Profile: No results for input(s): CHOL, HDL, LDLCALC, TRIG, CHOLHDL,  LDLDIRECT in the last 72 hours. Thyroid Function Tests: No results for input(s): TSH, T4TOTAL, FREET4, T3FREE, THYROIDAB in the last 72 hours. Anemia Panel: No results for input(s): VITAMINB12, FOLATE, FERRITIN, TIBC, IRON, RETICCTPCT in the last 72 hours. Urine analysis:    Component Value Date/Time   COLORURINE YELLOW 09/11/2018 0959   APPEARANCEUR HAZY (A) 09/11/2018 0959   LABSPEC 1.026 09/11/2018 0959   PHURINE 5.0 09/11/2018 0959   GLUCOSEU NEGATIVE 09/11/2018 0959   HGBUR SMALL (A) 09/11/2018 0959   BILIRUBINUR NEGATIVE 09/11/2018 0959   KETONESUR 20 (A) 09/11/2018 0959   PROTEINUR 30 (A) 09/11/2018 0959   UROBILINOGEN 0.2 03/21/2013 0800   NITRITE NEGATIVE 09/11/2018 0959   LEUKOCYTESUR NEGATIVE 09/11/2018 0959    Radiological Exams on Admission: Dg Abd Acute W/chest  Result Date: 09/11/2018 CLINICAL DATA:  Fever, body aches and diarrhea today. EXAM: DG ABDOMEN ACUTE W/ 1V CHEST COMPARISON:  PA and lateral chest 03/18/2013. FINDINGS: Single-view of the chest demonstrates clear lungs and normal heart size. No pneumothorax or pleural fluid. Two views of the abdomen show no free intraperitoneal air. The bowel gas pattern is nonobstructive. No abnormal abdominal calcification or focal bony abnormality. IMPRESSION: No acute finding chest or  abdomen. Electronically Signed   By: Inge Rise M.D.   On: 09/11/2018 12:29    EKG: Independently reviewed.  Sinus tachycardia, no ischemic changes  Assessment/Plan Principal Problem:   Gastroenteritis Active Problems:   Essential hypertension, benign   Type 2 diabetes mellitus with complication, with long-term current use of insulin (HCC)   Hyperlipidemia   GERD (gastroesophageal reflux disease)   Hypertension   Vomiting and diarrhea   Hypokalemia   Hyponatremia     1. Gastroenteritis, suspect viral etiology.  She has persistent vomiting and diarrhea.  Stool for C. difficile checked and found to be negative.  GI pathogen panel in process.  Continue supportive treatment.  Start enteric precautions.  Continue antiemetics and IV fluids.  Start on clear liquids for now and advance diet as tolerated. 2. Orthostasis.  Continue IV fluid hydration. 3. Hyponatremia/hypokalemia.  Related to GI losses.  Replace with IV fluids 4. Hypertension.  Continue home regimen of losartan 5. GERD.  Continue on PPI 6. Hyperlipidemia.  Continue statin 7. Diabetes.  Continue on Lantus.  Supplement with sliding scale insulin.  Hold metformin and Trulicity.  DVT prophylaxis: lovenox  Code Status: full code  Family Communication: no family present  Disposition Plan: discharge home once diarrhea and vomiting have improved  Consults called:   Admission status: observation, tele   Kathie Dike MD [facility] Pager [phone]  If 7PM-7AM, please contact night-coverage [email or web address] Password [facility]  09/11/2018, 4:42 PM

## 2018-09-11 NOTE — Plan of Care (Signed)
  Problem: Pain Managment: Goal: General experience of comfort will improve Outcome: Progressing   

## 2018-09-11 NOTE — ED Provider Notes (Signed)
[facility] Provider Note   CSN: [id number] Arrival date & time: 09/11/18  5631     History   Chief Complaint Chief Complaint  Patient presents with  . Emesis  . Diarrhea    HPI [name] is a 62 y.o. female.  HPI Pt was seen at 1000. Per pt, c/o gradual onset and persistence of multiple intermittent episodes of N/V/D for the past 4 days.   Describes the stools as "green" and "watery."  Has been associated with poor PO intake and generalized weakness. EMS states pt had diarrhea before they left the scene as well as on the truck en route to the ED. EMS states pt's initial HR was "140's" which improved to "110's" after IVF NS 576m bolus.  Denies abd pain, no CP/SOB, no back pain, no fevers, no black or blood in stools or emesis.    Past Medical History:  Diagnosis Date  . Anxiety   . Essential hypertension 2009  . GERD (gastroesophageal reflux disease)   . Iron deficiency anemia   . Osteoarthritis   . Palpitations    Tachypalpitations on beta blockers since 2010  . Type 2 diabetes mellitus ([place]  . Vitamin D deficiency     Patient Active Problem List   Diagnosis Date Noted  . Chest pain 08/17/2018  . Mild aortic stenosis 08/17/2018  . Iron deficiency anemia 10/05/2017  . Recurrent major depressive disorder, in partial remission ([place] 10/06/2016  . Hyperlipidemia 02/01/2016  . Hyperthyroidism 02/01/2016  . Arthritis 02/01/2016  . GERD (gastroesophageal reflux disease) 02/01/2016  . Hypertension 02/01/2016  . Uncontrolled type 2 diabetes mellitus with peripheral neuropathy ([place] 02/01/2016  . Vitamin D insufficiency 02/01/2016  . Palpitations 04/03/2011  . Morbid obesity ([place] 04/03/2011  . Essential hypertension, benign 04/03/2011  . Type 2 diabetes mellitus with complication, with long-term current use of insulin ([place] 04/03/2011    Past Surgical History:  Procedure Laterality Date  . ABDOMINAL HYSTERECTOMY     History of  cervical cancer  . TONSILLECTOMY       OB History   None      Home Medications    Prior to Admission medications   Medication Sig Start Date End Date Taking? Authorizing Provider  blood glucose meter kit and supplies KIT Dispense based on patient and insurance preference. Use up to four times daily as directed. (FOR ICD-10 E11.65) 08/20/17   NCassandria Anger MD  Cholecalciferol (VITAMIN D3) 5000 units TABS Take by mouth.    [provider]  FERROUSUL 325 (65 Fe) MG tablet Take 1 tablet by mouth daily. 05/28/18   [provider]  gabapentin (NEURONTIN) 400 MG capsule Take 400 mg by mouth 3 (three) times daily. 11/24/14   [provider]  Insulin Glargine (LANTUS SOLOSTAR Simpson) Inject 20 Units into the skin at bedtime.    [provider]  isosorbide mononitrate (IMDUR) 30 MG 24 hr tablet Take 1 tablet (30 mg total) by mouth daily. 08/22/18 11/20/18  LImogene Burn PA-C  losartan (COZAAR) 100 MG tablet Take 100 mg by mouth daily. 09/21/14   [provider]  magnesium oxide (MAG-OX) 400 MG tablet Take 400 mg by mouth 2 (two) times daily.    [provider]  meloxicam (MOBIC) 15 MG tablet Take 15 mg by mouth at bedtime. 10/26/14   [provider]  metFORMIN (GLUCOPHAGE) 1000 MG tablet Take 1 tablet (1,000 mg total) by mouth 2 (two) times daily. 06/06/18  Cassandria Anger, MD  metoprolol tartrate (LOPRESSOR) 50 MG tablet Take one hour before your test 08/22/18   Imogene Burn, PA-C  pantoprazole (PROTONIX) 40 MG tablet Take 40 mg by mouth daily.      [provider]  pravastatin (PRAVACHOL) 40 MG tablet Take 20 mg by mouth at bedtime.     [provider]  predniSONE (DELTASONE) 50 MG tablet Take 1 Tablet 13 hours prior to test, another 7 hours prior to test , and the last 1 hour prior to test 08/22/18   Imogene Burn, PA-C  TRULICITY 1.5 QH/4.7ML SOPN INJECT  1.5MG  INTO  THE  SKIN EVERY WEEK 09/08/18    Cassandria Anger, MD    Family History Family History  Problem Relation Age of Onset  . Diabetes Father   . Hypertension Father   . Heart failure Mother   . Stroke Sister 24    Social History Social History   Tobacco Use  . Smoking status: Never Smoker  . Smokeless tobacco: Never Used  Substance Use Topics  . Alcohol use: No  . Drug use: No     Allergies   Sulfa antibiotics and Doxepin   Review of Systems Review of Systems ROS: Statement: All systems negative except as marked or noted in the HPI; Constitutional: Negative for fever and chills. +generalized weakness.; ; Eyes: Negative for eye pain, redness and discharge. ; ; ENMT: Negative for ear pain, hoarseness, nasal congestion, sinus pressure and sore throat. ; ; Cardiovascular: Negative for chest pain, palpitations, diaphoresis, dyspnea and peripheral edema. ; ; Respiratory: Negative for cough, wheezing and stridor. ; ; Gastrointestinal: +N/V/D, poor PO intake. Negative for abdominal pain, blood in stool, hematemesis, jaundice and rectal bleeding. . ; ; Genitourinary: Negative for dysuria, flank pain and hematuria. ; ; Musculoskeletal: Negative for back pain and neck pain. Negative for swelling and trauma.; ; Skin: Negative for pruritus, rash, abrasions, blisters, bruising and skin lesion.; ; Neuro: Negative for headache, lightheadedness and neck stiffness. Negative for altered level of consciousness, altered mental status, extremity weakness, paresthesias, involuntary movement, seizure and syncope.       Physical Exam Updated Vital Signs BP (!) 142/84 (BP Location: Right Arm)   Pulse 96   Temp 99.1 F (37.3 C) (Oral)   Resp 18   Ht '5\' 8"'  (1.727 m)   Wt 113.3 kg   SpO2 95%   BMI 37.98 kg/m    Patient Vitals for the past 24 hrs:  BP Temp Temp src Pulse Resp SpO2 Height Weight  09/11/18 1215 - - - (!) 126 20 97 % - -  09/11/18 1200 128/85 - - (!) 123 20 98 % - -  09/11/18 1145 - - - (!) 121 (!) 27 97 % - -    09/11/18 1130 133/69 - - (!) 120 (!) 23 98 % - -  09/11/18 1122 122/70 - - (!) 114 (!) 24 95 % - -  09/11/18 1114 - - - (!) 115 (!) 23 97 % - -  09/11/18 1045 - - - - (!) 23 - - -  09/11/18 0956 - - - - - - '5\' 8"'  (1.727 m) 113.3 kg  09/11/18 0954 (!) 142/84 99.1 F (37.3 C) Oral 96 18 95 % - -    11:28 Orthostatic Vital Signs FS  Orthostatic Lying   BP- Lying: 126/109Abnormal    Pulse- Lying: 114       Orthostatic Sitting  BP- Sitting: 133/69  Pulse- Sitting: 118       Orthostatic Standing at 0 minutes  BP- Standing at 0 minutes: 114/94Abnormal    Pulse- Standing at 0 minutes: 142      Physical Exam 1005: Physical examination:  Nursing notes reviewed; Vital signs and O2 SAT reviewed;  Constitutional: Well developed, Well nourished, In no acute distress; Head:  Normocephalic, atraumatic; Eyes: EOMI, PERRL, No scleral icterus; ENMT: Mouth and pharynx normal, Mucous membranes dry; Neck: Supple, Full range of motion, No lymphadenopathy; Cardiovascular:  Tachycardic rate and rhythm, No gallop; Respiratory: Breath sounds clear & equal bilaterally, No wheezes.  Speaking full sentences with ease, Normal respiratory effort/excursion; Chest: Nontender, Movement normal; Abdomen: Soft, Nontender, Nondistended, Normal bowel sounds; Genitourinary: No CVA tenderness; Extremities: Peripheral pulses normal, No tenderness, No edema, No calf edema or asymmetry.; Neuro: AA&Ox3, Major CN grossly intact.  Speech clear. No gross focal motor or sensory deficits in extremities.; Skin: Color normal, Warm, Dry.   ED Treatments / Results  Labs (all labs ordered are listed, but only abnormal results are displayed)   EKG EKG Interpretation  Date/Time:  Sunday September 11 2018 10:17:51 EDT Ventricular Rate:  124 PR Interval:    QRS Duration: 85 QT Interval:  321 QTC Calculation: 461 R Axis:   -176 Text Interpretation:  Sinus tachycardia Right axis deviation Baseline wander When compared with ECG of  03/21/2013 Rate faster Confirmed by Francine Graven [phone]) on 09/11/2018 10:31:21 AM   Radiology   Procedures Procedures (including critical care time)  Medications Ordered in ED Medications  0.9 %  sodium chloride infusion (has no administration in time range)  sodium chloride 0.9 % bolus 500 mL ( Intravenous New Bag/Given 09/11/18 1017)     Initial Impression / Assessment and Plan / ED Course  I have reviewed the triage vital signs and the nursing notes.  Pertinent labs & imaging results that were available during my care of the patient were reviewed by me and considered in my medical decision making (see chart for details).  MDM Reviewed: previous chart, nursing note and vitals Reviewed previous: labs and ECG Interpretation: labs, ECG and x-ray Total time providing critical care: 30-74 minutes. This excludes time spent performing separately reportable procedures and services. Consults: admitting MD   CRITICAL CARE Performed by: Francine Graven Total critical care time: 35 minutes Critical care time was exclusive of separately billable procedures and treating other patients. Critical care was necessary to treat or prevent imminent or life-threatening deterioration. Critical care was time spent personally by me on the following activities: development of treatment plan with patient and/or surrogate as well as nursing, discussions with consultants, evaluation of patient's response to treatment, examination of patient, obtaining history from patient or surrogate, ordering and performing treatments and interventions, ordering and review of laboratory studies, ordering and review of radiographic studies, pulse oximetry and re-evaluation of patient's condition.   Results for orders placed or performed during the hospital encounter of 09/11/18  C difficile quick scan w PCR reflex  Result Value Ref Range   C Diff antigen NEGATIVE NEGATIVE   C Diff toxin NEGATIVE NEGATIVE   C  Diff interpretation No C. difficile detected.   Lipase, blood  Result Value Ref Range   Lipase 45 11 - 51 U/L  Comprehensive metabolic panel  Result Value Ref Range   Sodium 130 (L) 135 - 145 mmol/L   Potassium 2.8 (L) 3.5 - 5.1 mmol/L   Chloride 97 (L) 98 - 111 mmol/L   CO2 21 (  L) 22 - 32 mmol/L   Glucose, Bld 184 (H) 70 - 99 mg/dL   BUN 25 (H) 6 - 20 mg/dL   Creatinine, Ser 1.00 0.44 - 1.00 mg/dL   Calcium 8.6 (L) 8.9 - 10.3 mg/dL   Total Protein 7.7 6.5 - 8.1 g/dL   Albumin 3.6 3.5 - 5.0 g/dL   AST 47 (H) 15 - 41 U/L   ALT 24 0 - 44 U/L   Alkaline Phosphatase 57 38 - 126 U/L   Total Bilirubin 1.2 0.3 - 1.2 mg/dL   GFR calc non Af Amer 60 (L) >60 mL/min   GFR calc Af Amer >60 >60 mL/min   Anion gap 12 5 - 15  Urinalysis, Routine w reflex microscopic  Result Value Ref Range   Color, Urine YELLOW YELLOW   APPearance HAZY (A) CLEAR   Specific Gravity, Urine 1.026 1.005 - 1.030   pH 5.0 5.0 - 8.0   Glucose, UA NEGATIVE NEGATIVE mg/dL   Hgb urine dipstick SMALL (A) NEGATIVE   Bilirubin Urine NEGATIVE NEGATIVE   Ketones, ur 20 (A) NEGATIVE mg/dL   Protein, ur 30 (A) NEGATIVE mg/dL   Nitrite NEGATIVE NEGATIVE   Leukocytes, UA NEGATIVE NEGATIVE   RBC / HPF 0-5 0 - 5 RBC/hpf   WBC, UA 0-5 0 - 5 WBC/hpf   Bacteria, UA NONE SEEN NONE SEEN   Squamous Epithelial / LPF 6-10 0 - 5   Mucus PRESENT   Troponin I  Result Value Ref Range   Troponin I <0.03 <0.03 ng/mL  CBC with Differential  Result Value Ref Range   WBC 7.8 4.0 - 10.5 K/uL   RBC 5.65 (H) 3.87 - 5.11 MIL/uL   Hemoglobin 13.7 12.0 - 15.0 g/dL   HCT 44.7 36.0 - 46.0 %   MCV 79.1 (L) 80.0 - 100.0 fL   MCH 24.2 (L) 26.0 - 34.0 pg   MCHC 30.6 30.0 - 36.0 g/dL   RDW 14.6 11.5 - 15.5 %   Platelets 165 150 - 400 K/uL   nRBC 0.0 0.0 - 0.2 %   Neutrophils Relative % 81 %   Neutro Abs 6.3 1.7 - 7.7 K/uL   Lymphocytes Relative 10 %   Lymphs Abs 0.8 0.7 - 4.0 K/uL   Monocytes Relative 7 %   Monocytes Absolute 0.5 0.1 -  1.0 K/uL   Eosinophils Relative 2 %   Eosinophils Absolute 0.2 0.0 - 0.5 K/uL   Basophils Relative 0 %   Basophils Absolute 0.0 0.0 - 0.1 K/uL   WBC Morphology DOHLE BODIES    Immature Granulocytes 0 %   Abs Immature Granulocytes 0.03 0.00 - 0.07 K/uL   Reactive, Benign Lymphocytes PRESENT   Magnesium  Result Value Ref Range   Magnesium 1.7 1.7 - 2.4 mg/dL   Dg Abd Acute W/chest Result Date: 09/11/2018 CLINICAL DATA:  Fever, body aches and diarrhea today. EXAM: DG ABDOMEN ACUTE W/ 1V CHEST COMPARISON:  PA and lateral chest 03/18/2013. FINDINGS: Single-view of the chest demonstrates clear lungs and normal heart size. No pneumothorax or pleural fluid. Two views of the abdomen show no free intraperitoneal air. The bowel gas pattern is nonobstructive. No abnormal abdominal calcification or focal bony abnormality. IMPRESSION: No acute finding chest or abdomen. Electronically Signed   By: Inge Rise M.D.   On: 09/11/2018 12:29     1300:  Abd remains benign. No fever while in the ED.  Potassium repleted PO, magnesium repleted IV. IV NS bolus x2L  total given for persistent tachycardia. Pt continues to stool multiple times while in the ED; sample sent to lab. Concern regarding pt's ability to keep up with ongoing fluid losses; will admit. T/C returned from [facility]. Roderic Palau, case discussed, including:  HPI, pertinent PM/SHx, VS/PE, dx testing, ED course and treatment:  Agreeable to admit.     Final Clinical Impressions(s) / ED Diagnoses   Final diagnoses:  None    ED Discharge Orders    None       Francine Graven, DO 09/14/18 1051

## 2018-09-11 NOTE — Progress Notes (Signed)
States has not vomited since 0930 this morning but continues to have diarrhea. Neg for cdiff.  Two IV attempts and contacted supervisor for help with IV

## 2018-09-11 NOTE — ED Notes (Signed)
Patient transported to X-ray 

## 2018-09-11 NOTE — ED Triage Notes (Signed)
Per EMS pt from home. Patient complains of fever, body aches, diarrhea, nausea, and vomiting. EMS states diarrhea and vomiting before leaving, and on the truck.  Pt states she has not been able to eat or drink since Thursday.  EMS states HR 140 initially and dropped to 110 after bolus.

## 2018-09-12 DIAGNOSIS — Z794 Long term (current) use of insulin: Secondary | ICD-10-CM | POA: Diagnosis not present

## 2018-09-12 DIAGNOSIS — E785 Hyperlipidemia, unspecified: Secondary | ICD-10-CM | POA: Diagnosis present

## 2018-09-12 DIAGNOSIS — E871 Hypo-osmolality and hyponatremia: Secondary | ICD-10-CM

## 2018-09-12 DIAGNOSIS — E860 Dehydration: Secondary | ICD-10-CM | POA: Diagnosis present

## 2018-09-12 DIAGNOSIS — Z833 Family history of diabetes mellitus: Secondary | ICD-10-CM | POA: Diagnosis not present

## 2018-09-12 DIAGNOSIS — K219 Gastro-esophageal reflux disease without esophagitis: Secondary | ICD-10-CM | POA: Diagnosis present

## 2018-09-12 DIAGNOSIS — R197 Diarrhea, unspecified: Secondary | ICD-10-CM | POA: Diagnosis present

## 2018-09-12 DIAGNOSIS — E1142 Type 2 diabetes mellitus with diabetic polyneuropathy: Secondary | ICD-10-CM | POA: Diagnosis present

## 2018-09-12 DIAGNOSIS — E782 Mixed hyperlipidemia: Secondary | ICD-10-CM | POA: Diagnosis not present

## 2018-09-12 DIAGNOSIS — E876 Hypokalemia: Secondary | ICD-10-CM | POA: Diagnosis present

## 2018-09-12 DIAGNOSIS — Z8249 Family history of ischemic heart disease and other diseases of the circulatory system: Secondary | ICD-10-CM | POA: Diagnosis not present

## 2018-09-12 DIAGNOSIS — A020 Salmonella enteritis: Secondary | ICD-10-CM | POA: Diagnosis present

## 2018-09-12 DIAGNOSIS — Z79899 Other long term (current) drug therapy: Secondary | ICD-10-CM | POA: Diagnosis not present

## 2018-09-12 DIAGNOSIS — I10 Essential (primary) hypertension: Secondary | ICD-10-CM | POA: Diagnosis present

## 2018-09-12 DIAGNOSIS — Z90710 Acquired absence of both cervix and uterus: Secondary | ICD-10-CM | POA: Diagnosis not present

## 2018-09-12 DIAGNOSIS — Z8541 Personal history of malignant neoplasm of cervix uteri: Secondary | ICD-10-CM | POA: Diagnosis not present

## 2018-09-12 LAB — BASIC METABOLIC PANEL
ANION GAP: 9 (ref 5–15)
BUN: 18 mg/dL (ref 6–20)
CHLORIDE: 105 mmol/L (ref 98–111)
CO2: 21 mmol/L — AB (ref 22–32)
Calcium: 8.4 mg/dL — ABNORMAL LOW (ref 8.9–10.3)
Creatinine, Ser: 0.78 mg/dL (ref 0.44–1.00)
GFR calc Af Amer: >60 mL/min (ref >60)
GFR calc non Af Amer: >60 mL/min (ref >60)
Glucose, Bld: 110 mg/dL — ABNORMAL HIGH (ref 70–99)
POTASSIUM: 3 mmol/L — AB (ref 3.5–5.1)
Sodium: 135 mmol/L (ref 135–145)

## 2018-09-12 LAB — GASTROINTESTINAL PANEL BY PCR, STOOL (REPLACES STOOL CULTURE)
ADENOVIRUS F40/41: NOT DETECTED
ASTROVIRUS: NOT DETECTED
CYCLOSPORA CAYETANENSIS: NOT DETECTED
Campylobacter species: NOT DETECTED
Cryptosporidium: NOT DETECTED
ENTEROPATHOGENIC E COLI (EPEC): NOT DETECTED
ENTEROTOXIGENIC E COLI (ETEC): NOT DETECTED
Entamoeba histolytica: NOT DETECTED
Enteroaggregative E coli (EAEC): NOT DETECTED
Giardia lamblia: NOT DETECTED
NOROVIRUS GI/GII: NOT DETECTED
Plesimonas shigelloides: NOT DETECTED
Rotavirus A: NOT DETECTED
Salmonella species: DETECTED — AB
Sapovirus (I, II, IV, and V): NOT DETECTED
Shiga like toxin producing E coli (STEC): NOT DETECTED
Shigella/Enteroinvasive E coli (EIEC): NOT DETECTED
VIBRIO CHOLERAE: NOT DETECTED
VIBRIO SPECIES: NOT DETECTED
Yersinia enterocolitica: NOT DETECTED

## 2018-09-12 LAB — CBC
HCT: 38.2 % (ref 36.0–46.0)
Hemoglobin: 11.9 g/dL — ABNORMAL LOW (ref 12.0–15.0)
MCH: 24.9 pg — ABNORMAL LOW (ref 26.0–34.0)
MCHC: 31.2 g/dL (ref 30.0–36.0)
MCV: 79.9 fL — ABNORMAL LOW (ref 80.0–100.0)
PLATELETS: 151 10*3/uL (ref 150–400)
RBC: 4.78 MIL/uL (ref 3.87–5.11)
RDW: 14.8 % (ref 11.5–15.5)
WBC: 6 10*3/uL (ref 4.0–10.5)
nRBC: 0 % (ref 0.0–0.2)

## 2018-09-12 LAB — GLUCOSE, CAPILLARY
Glucose-Capillary: 102 mg/dL — ABNORMAL HIGH (ref 70–99)
Glucose-Capillary: 105 mg/dL — ABNORMAL HIGH (ref 70–99)
Glucose-Capillary: 82 mg/dL (ref 70–99)
Glucose-Capillary: 103 mg/dL — ABNORMAL HIGH (ref 70–99)

## 2018-09-12 MED ORDER — POTASSIUM CHLORIDE 2 MEQ/ML IV SOLN
INTRAVENOUS | Status: DC
  Administered 2018-09-12 – 2018-09-14 (×5): via INTRAVENOUS
  Filled 2018-09-12 (×11): qty 1000

## 2018-09-12 MED ORDER — CIPROFLOXACIN IN D5W 400 MG/200ML IV SOLN
400.0000 mg | Freq: Two times a day (BID) | INTRAVENOUS | Status: DC
  Administered 2018-09-12 – 2018-09-14 (×4): 400 mg via INTRAVENOUS
  Filled 2018-09-12 (×4): qty 200

## 2018-09-12 NOTE — Progress Notes (Signed)
CRITICAL VALUE ALERT  Critical Value:  GI panel + salmonella  Date & Time Notied:  3358 09/12/18  Provider Notified: Dr. Roderic Palau  Orders Received/Actions taken: waiting for reply

## 2018-09-12 NOTE — Progress Notes (Signed)
PROGRESS NOTE    [name]  GHW:[id number] DOB: Mar 24, 1956 DOA: 09/11/2018 PCP: Stephens Shire, MD    Brief Narrative:  62 year old female admitted to the hospital with persistent vomiting, diarrhea, dehydration, hypokalemia.  She was noted to be tachycardic and febrile.  Stool studies positive for Salmonella enteritis.  Started on ciprofloxacin.  Continue supportive treatment with antiemetics, IV fluids and electrolyte replacement.   Assessment & Plan:   Principal Problem:   Salmonella enteritis Active Problems:   Essential hypertension, benign   Type 2 diabetes mellitus with complication, with long-term current use of insulin (HCC)   Hyperlipidemia   GERD (gastroesophageal reflux disease)   Hypertension   Vomiting and diarrhea   Hypokalemia   Hyponatremia   Gastroenteritis   1. Salmonella enteritis.  GI pathogen panel positive for Salmonella.  She is been started on ciprofloxacin.  Continue supportive treatment.  Vomiting is better.  We will try to advance diet.  At her current rate of stool frequency, she likely not be able to compensate for fluid loss with oral fluids.  Continue IV hydration 2. Diabetes.  Holding oral agents.  Sliding scale insulin.  Blood sugars are stable. 3. Hypokalemia.  Continue replacement. 4. Hyponatremia.  Improving with IV fluids. 5. GERD.  Continue on PPI. 6. Hyperlipidemia.  Continue statin 7. Hypertension.  Continue on Cozaar   DVT prophylaxis: Lovenox Code Status: Full code Family Communication: No family present Disposition Plan: Discharge home once diarrhea has improved   Consultants:     Procedures:     Antimicrobials:   Cipro 10/21 >   Subjective: No further vomiting.  Has had 8-10 stools this morning.  Is also been febrile this morning.  Objective: Vitals:   09/11/18 1456 09/11/18 2136 09/12/18 0700 09/12/18 1506  BP: 130/80 109/70 110/64 (!) 106/58  Pulse: (!) 113 (!) 125 (!) 111 (!) 109  Resp:      Temp:  98.3 F (36.8 C) 99.2 F (37.3 C) (!) 101.1 F (38.4 C) (!) 100.7 F (38.2 C)  TempSrc:  Oral  Oral  SpO2: 97% 96% 97% 99%  Weight:      Height:        Intake/Output Summary (Last 24 hours) at 09/12/2018 1851 Last data filed at 09/12/2018 1700 Gross per 24 hour  Intake 797.67 ml  Output 6 ml  Net 791.67 ml   Filed Weights   09/11/18 0956  Weight: 113.3 kg    Examination:  General exam: Alert, awake, oriented x 3 Respiratory system: Clear to auscultation. Respiratory effort normal. Cardiovascular system:RRR. No murmurs, rubs, gallops. Gastrointestinal system: Abdomen is nondistended, soft and nontender. No organomegaly or masses felt. Normal bowel sounds heard. Central nervous system: Alert and oriented. No focal neurological deficits. Extremities: No C/C/E, +pedal pulses Skin: No rashes, lesions or ulcers Psychiatry: Judgement and insight appear normal. Mood & affect appropriate.      Data Reviewed: I have personally reviewed following labs and imaging studies  CBC: Recent Labs  Lab 09/11/18 1016 09/12/18 0648  WBC 7.8 6.0  NEUTROABS 6.3  --   HGB 13.7 11.9*  HCT 44.7 38.2  MCV 79.1* 79.9*  PLT 165 789   Basic Metabolic Panel: Recent Labs  Lab 09/11/18 1016 09/12/18 0648  NA 130* 135  K 2.8* 3.0*  CL 97* 105  CO2 21* 21*  GLUCOSE 184* 110*  BUN 25* 18  CREATININE 1.00 0.78  CALCIUM 8.6* 8.4*  MG 1.7  --    GFR: Estimated Creatinine  Clearance: 98.8 mL/min (by C-G formula based on SCr of 0.78 mg/dL). Liver Function Tests: Recent Labs  Lab 09/11/18 1016  AST 47*  ALT 24  ALKPHOS 57  BILITOT 1.2  PROT 7.7  ALBUMIN 3.6   Recent Labs  Lab 09/11/18 1016  LIPASE 45   No results for input(s): AMMONIA in the last 168 hours. Coagulation Profile: No results for input(s): INR, PROTIME in the last 168 hours. Cardiac Enzymes: Recent Labs  Lab 09/11/18 1016  TROPONINI <0.03   BNP (last 3 results) No results for input(s): PROBNP in the last  8760 hours. HbA1C: No results for input(s): HGBA1C in the last 72 hours. CBG: Recent Labs  Lab 09/11/18 1708 09/11/18 2137 09/12/18 0738 09/12/18 1135 09/12/18 1643  GLUCAP 129* 113* 102* 105* 82   Lipid Profile: No results for input(s): CHOL, HDL, LDLCALC, TRIG, CHOLHDL, LDLDIRECT in the last 72 hours. Thyroid Function Tests: No results for input(s): TSH, T4TOTAL, FREET4, T3FREE, THYROIDAB in the last 72 hours. Anemia Panel: No results for input(s): VITAMINB12, FOLATE, FERRITIN, TIBC, IRON, RETICCTPCT in the last 72 hours. Sepsis Labs: No results for input(s): PROCALCITON, LATICACIDVEN in the last 168 hours.  Recent Results (from the past 240 hour(s))  Gastrointestinal Panel by PCR , Stool     Status: Abnormal   Collection Time: 09/11/18 10:10 AM  Result Value Ref Range Status   Campylobacter species NOT DETECTED NOT DETECTED Final   Plesimonas shigelloides NOT DETECTED NOT DETECTED Final   Salmonella species DETECTED (A) NOT DETECTED Final    Comment: RESULT CALLED TO, READ BACK BY AND VERIFIED WITH: NIKKI KITTRELL AT 1227 09/12/18 SDR    Yersinia enterocolitica NOT DETECTED NOT DETECTED Final   Vibrio species NOT DETECTED NOT DETECTED Final   Vibrio cholerae NOT DETECTED NOT DETECTED Final   Enteroaggregative E coli (EAEC) NOT DETECTED NOT DETECTED Final   Enteropathogenic E coli (EPEC) NOT DETECTED NOT DETECTED Final   Enterotoxigenic E coli (ETEC) NOT DETECTED NOT DETECTED Final   Shiga like toxin producing E coli (STEC) NOT DETECTED NOT DETECTED Final   Shigella/Enteroinvasive E coli (EIEC) NOT DETECTED NOT DETECTED Final   Cryptosporidium NOT DETECTED NOT DETECTED Final   Cyclospora cayetanensis NOT DETECTED NOT DETECTED Final   Entamoeba histolytica NOT DETECTED NOT DETECTED Final   Giardia lamblia NOT DETECTED NOT DETECTED Final   Adenovirus F40/41 NOT DETECTED NOT DETECTED Final   Astrovirus NOT DETECTED NOT DETECTED Final   Norovirus GI/GII NOT DETECTED NOT  DETECTED Final   Rotavirus A NOT DETECTED NOT DETECTED Final   Sapovirus (I, II, IV, and V) NOT DETECTED NOT DETECTED Final    Comment: Performed at [facility], [address] [place], [place] [address]  C difficile quick scan w PCR reflex     Status: None   Collection Time: 09/11/18 10:10 AM  Result Value Ref Range Status   C Diff antigen NEGATIVE NEGATIVE Final   C Diff toxin NEGATIVE NEGATIVE Final   C Diff interpretation No C. difficile detected.  Final    Comment: Performed at [facility], [address], [place], [place] [address]         Radiology Studies: Dg Abd Acute W/chest  Result Date: 09/11/2018 CLINICAL DATA:  Fever, body aches and diarrhea today. EXAM: DG ABDOMEN ACUTE W/ 1V CHEST COMPARISON:  PA and lateral chest 03/18/2013. FINDINGS: Single-view of the chest demonstrates clear lungs and normal heart size. No pneumothorax or pleural fluid. Two views of the abdomen  show no free intraperitoneal air. The bowel gas pattern is nonobstructive. No abnormal abdominal calcification or focal bony abnormality. IMPRESSION: No acute finding chest or abdomen. Electronically Signed   By: Inge Rise M.D.   On: 09/11/2018 12:29        Scheduled Meds: . enoxaparin (LOVENOX) injection  40 mg Subcutaneous Q24H  . gabapentin  400 mg Oral TID  . insulin aspart  0-5 Units Subcutaneous QHS  . insulin aspart  0-9 Units Subcutaneous TID WC  . insulin glargine  20 Units Subcutaneous QHS  . isosorbide mononitrate  30 mg Oral Daily  . losartan  100 mg Oral Daily  . pantoprazole  40 mg Oral Daily  . pravastatin  20 mg Oral Daily   Continuous Infusions: . ciprofloxacin 400 mg (09/12/18 1326)  . lactated ringers with kcl 100 mL/hr at 09/12/18 1029     LOS: 0 days    Time spent: 25 minutes    Kathie Dike, MD [facility] Pager [phone]  If 7PM-7AM, please contact night-coverage [email or web address] Password [facility] 09/12/2018, 6:51 PM

## 2018-09-12 NOTE — Care Management Obs Status (Signed)
[place] NOTIFICATION   Patient Details  Name: [name] MRN: [id number] Date of Birth: Aug 26, 1956   Medicare Observation Status Notification Given:  Yes    Shelda Altes 09/12/2018, 11:52 AM

## 2018-09-13 LAB — BASIC METABOLIC PANEL
Anion gap: 9 (ref 5–15)
BUN: 18 mg/dL (ref 6–20)
CALCIUM: 8.7 mg/dL — AB (ref 8.9–10.3)
CO2: 18 mmol/L — AB (ref 22–32)
Chloride: 109 mmol/L (ref 98–111)
Creatinine, Ser: 0.99 mg/dL (ref 0.44–1.00)
GFR calc non Af Amer: >60 mL/min (ref >60)
GLUCOSE: 109 mg/dL — AB (ref 70–99)
Potassium: 3.3 mmol/L — ABNORMAL LOW (ref 3.5–5.1)
Sodium: 136 mmol/L (ref 135–145)

## 2018-09-13 LAB — GLUCOSE, CAPILLARY
GLUCOSE-CAPILLARY: 100 mg/dL — AB (ref 70–99)
Glucose-Capillary: 99 mg/dL (ref 70–99)
Glucose-Capillary: 102 mg/dL — ABNORMAL HIGH (ref 70–99)
Glucose-Capillary: 97 mg/dL (ref 70–99)

## 2018-09-13 LAB — HIV ANTIBODY (ROUTINE TESTING W REFLEX): HIV Screen 4th Generation wRfx: NONREACTIVE

## 2018-09-13 NOTE — Progress Notes (Signed)
PROGRESS NOTE    [name]  PYK:[id number] DOB: January 23, 1956 DOA: 09/11/2018 PCP: Stephens Shire, MD    Brief Narrative:  62 year old female admitted to the hospital with persistent vomiting, diarrhea, dehydration, hypokalemia.  She was noted to be tachycardic and febrile.  Stool studies positive for Salmonella enteritis.  Started on ciprofloxacin.  Continue supportive treatment with antiemetics, IV fluids and electrolyte replacement.   Assessment & Plan:   Principal Problem:   Salmonella enteritis Active Problems:   Essential hypertension, benign   Type 2 diabetes mellitus with complication, with long-term current use of insulin (HCC)   Hyperlipidemia   GERD (gastroesophageal reflux disease)   Hypertension   Vomiting and diarrhea   Hypokalemia   Hyponatremia   Gastroenteritis   1. Salmonella enteritis.  GI pathogen panel positive for Salmonella.  She is currently on ciprofloxacin.  Continue supportive treatment. Vomiting is better.  Tolerating soft diet.  At her current rate of stool frequency, she will likely not be able to compensate for fluid loss with oral fluids.  Continue IV hydration 2. Diabetes.  Holding oral agents.  Sliding scale insulin.  Blood sugars are stable. 3. Hypokalemia.  Continue replacement. 4. Hyponatremia.  Improving with IV fluids. 5. GERD.  Continue on PPI. 6. Hyperlipidemia.  Continue statin 7. Hypertension.  Continue on Cozaar   DVT prophylaxis: Lovenox Code Status: Full code Family Communication: No family present Disposition Plan: Discharge home once diarrhea has improved   Consultants:     Procedures:     Antimicrobials:   Cipro 10/21 >   Subjective: No vomiting.  Continues to have frequent diarrhea although she feels that her stools are starting to form up.  Continues to have fevers.  Objective: Vitals:   09/12/18 1506 09/12/18 2131 09/13/18 0527 09/13/18 1435  BP: (!) 106/58 123/75 132/71 123/62  Pulse: (!) 109 (!) 103  67 (!) 107  Resp:  18 18   Temp: (!) 100.7 F (38.2 C) 99.1 F (37.3 C) 99.3 F (37.4 C) (!) 101.4 F (38.6 C)  TempSrc: Oral Oral Oral Oral  SpO2: 99% 97% 97% 97%  Weight:      Height:        Intake/Output Summary (Last 24 hours) at 09/13/2018 1858 Last data filed at 09/13/2018 1819 Gross per 24 hour  Intake 360 ml  Output -  Net 360 ml   Filed Weights   09/11/18 0956  Weight: 113.3 kg    Examination:  General exam: Alert, awake, oriented x 3 Respiratory system: Clear to auscultation. Respiratory effort normal. Cardiovascular system:RRR. No murmurs, rubs, gallops. Gastrointestinal system: Abdomen is nondistended, soft and nontender. No organomegaly or masses felt. Normal bowel sounds heard. Central nervous system: Alert and oriented. No focal neurological deficits. Extremities: No C/C/E, +pedal pulses Skin: No rashes, lesions or ulcers Psychiatry: Judgement and insight appear normal. Mood & affect appropriate.     Data Reviewed: I have personally reviewed following labs and imaging studies  CBC: Recent Labs  Lab 09/11/18 1016 09/12/18 0648  WBC 7.8 6.0  NEUTROABS 6.3  --   HGB 13.7 11.9*  HCT 44.7 38.2  MCV 79.1* 79.9*  PLT 165 539   Basic Metabolic Panel: Recent Labs  Lab 09/11/18 1016 09/12/18 0648 09/13/18 0613  NA 130* 135 136  K 2.8* 3.0* 3.3*  CL 97* 105 109  CO2 21* 21* 18*  GLUCOSE 184* 110* 109*  BUN 25* 18 18  CREATININE 1.00 0.78 0.99  CALCIUM 8.6* 8.4* 8.7*  MG 1.7  --   --    GFR: Estimated Creatinine Clearance: 79.8 mL/min (by C-G formula based on SCr of 0.99 mg/dL). Liver Function Tests: Recent Labs  Lab 09/11/18 1016  AST 47*  ALT 24  ALKPHOS 57  BILITOT 1.2  PROT 7.7  ALBUMIN 3.6   Recent Labs  Lab 09/11/18 1016  LIPASE 45   No results for input(s): AMMONIA in the last 168 hours. Coagulation Profile: No results for input(s): INR, PROTIME in the last 168 hours. Cardiac Enzymes: Recent Labs  Lab 09/11/18 1016    TROPONINI <0.03   BNP (last 3 results) No results for input(s): PROBNP in the last 8760 hours. HbA1C: No results for input(s): HGBA1C in the last 72 hours. CBG: Recent Labs  Lab 09/12/18 1643 09/12/18 2133 09/13/18 0747 09/13/18 1144 09/13/18 1655  GLUCAP 82 103* 99 102* 100*   Lipid Profile: No results for input(s): CHOL, HDL, LDLCALC, TRIG, CHOLHDL, LDLDIRECT in the last 72 hours. Thyroid Function Tests: No results for input(s): TSH, T4TOTAL, FREET4, T3FREE, THYROIDAB in the last 72 hours. Anemia Panel: No results for input(s): VITAMINB12, FOLATE, FERRITIN, TIBC, IRON, RETICCTPCT in the last 72 hours. Sepsis Labs: No results for input(s): PROCALCITON, LATICACIDVEN in the last 168 hours.  Recent Results (from the past 240 hour(s))  Gastrointestinal Panel by PCR , Stool     Status: Abnormal   Collection Time: 09/11/18 10:10 AM  Result Value Ref Range Status   Campylobacter species NOT DETECTED NOT DETECTED Final   Plesimonas shigelloides NOT DETECTED NOT DETECTED Final   Salmonella species DETECTED (A) NOT DETECTED Final    Comment: RESULT CALLED TO, READ BACK BY AND VERIFIED WITH: NIKKI KITTRELL AT 1227 09/12/18 SDR    Yersinia enterocolitica NOT DETECTED NOT DETECTED Final   Vibrio species NOT DETECTED NOT DETECTED Final   Vibrio cholerae NOT DETECTED NOT DETECTED Final   Enteroaggregative E coli (EAEC) NOT DETECTED NOT DETECTED Final   Enteropathogenic E coli (EPEC) NOT DETECTED NOT DETECTED Final   Enterotoxigenic E coli (ETEC) NOT DETECTED NOT DETECTED Final   Shiga like toxin producing E coli (STEC) NOT DETECTED NOT DETECTED Final   Shigella/Enteroinvasive E coli (EIEC) NOT DETECTED NOT DETECTED Final   Cryptosporidium NOT DETECTED NOT DETECTED Final   Cyclospora cayetanensis NOT DETECTED NOT DETECTED Final   Entamoeba histolytica NOT DETECTED NOT DETECTED Final   Giardia lamblia NOT DETECTED NOT DETECTED Final   Adenovirus F40/41 NOT DETECTED NOT DETECTED Final    Astrovirus NOT DETECTED NOT DETECTED Final   Norovirus GI/GII NOT DETECTED NOT DETECTED Final   Rotavirus A NOT DETECTED NOT DETECTED Final   Sapovirus (I, II, IV, and V) NOT DETECTED NOT DETECTED Final    Comment: Performed at [facility], [address] [place], [place] [address]  C difficile quick scan w PCR reflex     Status: None   Collection Time: 09/11/18 10:10 AM  Result Value Ref Range Status   C Diff antigen NEGATIVE NEGATIVE Final   C Diff toxin NEGATIVE NEGATIVE Final   C Diff interpretation No C. difficile detected.  Final    Comment: Performed at [facility], [address] [place], [place] [address]         Radiology Studies: No results found.      Scheduled Meds: . enoxaparin (LOVENOX) injection  40 mg Subcutaneous Q24H  . gabapentin  400 mg Oral TID  . insulin aspart  0-5 Units Subcutaneous QHS  . insulin  aspart  0-9 Units Subcutaneous TID WC  . insulin glargine  20 Units Subcutaneous QHS  . isosorbide mononitrate  30 mg Oral Daily  . losartan  100 mg Oral Daily  . pantoprazole  40 mg Oral Daily  . pravastatin  20 mg Oral Daily   Continuous Infusions: . ciprofloxacin 400 mg (09/13/18 1229)  . lactated ringers with kcl 100 mL/hr at 09/13/18 1857     LOS: 1 day    Time spent: 25 minutes    Kathie Dike, MD [facility] Pager [phone]  If 7PM-7AM, please contact night-coverage [email or web address] Password [facility] 09/13/2018, 6:58 PM

## 2018-09-14 LAB — GLUCOSE, CAPILLARY
GLUCOSE-CAPILLARY: 93 mg/dL (ref 70–99)
GLUCOSE-CAPILLARY: 114 mg/dL — AB (ref 70–99)

## 2018-09-14 LAB — BASIC METABOLIC PANEL
ANION GAP: 3 — AB (ref 5–15)
BUN: 11 mg/dL (ref 6–20)
CO2: 24 mmol/L (ref 22–32)
Calcium: 8.2 mg/dL — ABNORMAL LOW (ref 8.9–10.3)
Chloride: 110 mmol/L (ref 98–111)
Creatinine, Ser: 0.79 mg/dL (ref 0.44–1.00)
GFR calc Af Amer: >60 mL/min (ref >60)
GFR calc non Af Amer: >60 mL/min (ref >60)
GLUCOSE: 105 mg/dL — AB (ref 70–99)
POTASSIUM: 3.4 mmol/L — AB (ref 3.5–5.1)
Sodium: 137 mmol/L (ref 135–145)

## 2018-09-14 MED ORDER — CIPROFLOXACIN HCL 500 MG PO TABS: 500.0000 mg | ORAL_TABLET | Freq: Two times a day (BID) | ORAL | 0 refills | Status: AC

## 2018-09-14 NOTE — Care Management Important Message (Signed)
Important Message  Patient Details  Name: LALITHA ILYAS MRN: [id number] Date of Birth: 1956-03-07   Medicare Important Message Given:  Yes    Shelda Altes 09/14/2018, 1:23 PM

## 2018-09-14 NOTE — Progress Notes (Signed)
IV discontinued,catheter intact. Discharge instructions given on medications and follow up visits,patient verbalized understanding. Prescriptions sent to Pharmacy of choice documented on AVS. Accompanied by staff to an awaiting vehicle. 

## 2018-09-14 NOTE — Discharge Summary (Signed)
Physician Discharge Summary  [name]:[id number] DOB: October 21, 1956 DOA: 09/11/2018  PCP: Stephens Shire, MD  Admit date: 09/11/2018 Discharge date: 09/14/2018  Admitted From: home Disposition:  home  Recommendations for Outpatient Follow-up:  1. Follow up with PCP as previously scheduled  Discharge Condition:home CODE STATUS: full code Diet recommendation: heart healthy, carb modified  Brief/Interim Summary: 62 year old female who was admitted to the hospital with persistent vomiting, diarrhea, dehydration and hypokalemia.  She was noted to be tachycardic and febrile.  Stool studies returned positive for Salmonella enteritis.  She was started on ciprofloxacin.  Overall fevers have resolved.  Clinically she is improving.  Diarrhea is better and she is no longer vomiting.  She is able to tolerate solid food.  Electrolytes have been replaced and dehydration has resolved.  She will be continued on her diabetic agents on discharge.  Resume Cozaar on discharge.  She will complete a course of ciprofloxacin.  The patient is feeling better and feels ready for discharge home.  Discharge Diagnoses:  Principal Problem:   Salmonella enteritis Active Problems:   Essential hypertension, benign   Type 2 diabetes mellitus with complication, with long-term current use of insulin (HCC)   Hyperlipidemia   GERD (gastroesophageal reflux disease)   Hypertension   Vomiting and diarrhea   Hypokalemia   Hyponatremia   Gastroenteritis    Discharge Instructions  Discharge Instructions    Diet - low sodium heart healthy   Complete by:  As directed    Increase activity slowly   Complete by:  As directed      Allergies as of 09/14/2018      Reactions   Sulfa Antibiotics Nausea And Vomiting   Other reaction(s): GI Upset (intolerance) unknown   Doxepin Rash, Swelling      Medication List    TAKE these medications   blood glucose meter kit and supplies Kit Dispense based on patient and  insurance preference. Use up to four times daily as directed. (FOR ICD-10 E11.65)   ciprofloxacin 500 MG tablet Commonly known as:  CIPRO Take 1 tablet (500 mg total) by mouth 2 (two) times daily for 4 days.   FERROUSUL 325 (65 FE) MG tablet Generic drug:  ferrous sulfate Take 1 tablet by mouth daily.   gabapentin 400 MG capsule Commonly known as:  NEURONTIN Take 400 mg by mouth 3 (three) times daily.   isosorbide mononitrate 30 MG 24 hr tablet Commonly known as:  IMDUR Take 1 tablet (30 mg total) by mouth daily.   LANTUS SOLOSTAR [place] Inject 20 Units into the skin at bedtime.   losartan 100 MG tablet Commonly known as:  COZAAR Take 100 mg by mouth daily.   magnesium oxide 400 MG tablet Commonly known as:  MAG-OX Take 400 mg by mouth 2 (two) times daily.   meloxicam 15 MG tablet Commonly known as:  MOBIC Take 15 mg by mouth at bedtime.   metFORMIN 1000 MG tablet Commonly known as:  GLUCOPHAGE Take 1 tablet (1,000 mg total) by mouth 2 (two) times daily.   metoprolol tartrate 50 MG tablet Commonly known as:  LOPRESSOR Take one hour before your test   pantoprazole 40 MG tablet Commonly known as:  PROTONIX Take 40 mg by mouth daily.   pravastatin 20 MG tablet Commonly known as:  PRAVACHOL Take 20 mg by mouth daily.   predniSONE 50 MG tablet Commonly known as:  DELTASONE Take 1 Tablet 13 hours prior to test, another 7 hours prior to  test , and the last 1 hour prior to test   TRULICITY 1.5 RX/5.4MG Sopn Generic drug:  Dulaglutide INJECT  1.5MG  INTO  THE  SKIN EVERY WEEK   Vitamin D3 5000 units Tabs Take 1 tablet by mouth daily.       Allergies  Allergen Reactions  . Sulfa Antibiotics Nausea And Vomiting    Other reaction(s): GI Upset (intolerance) unknown  . Doxepin Rash and Swelling    Consultations:     Procedures/Studies: Dg Abd Acute W/chest  Result Date: 09/11/2018 CLINICAL DATA:  Fever, body aches and diarrhea today. EXAM: DG ABDOMEN  ACUTE W/ 1V CHEST COMPARISON:  PA and lateral chest 03/18/2013. FINDINGS: Single-view of the chest demonstrates clear lungs and normal heart size. No pneumothorax or pleural fluid. Two views of the abdomen show no free intraperitoneal air. The bowel gas pattern is nonobstructive. No abnormal abdominal calcification or focal bony abnormality. IMPRESSION: No acute finding chest or abdomen. Electronically Signed   By: Inge Rise M.D.   On: 09/11/2018 12:29       Subjective: Diarrhea is better. No vomiting. Tolerating solid foods  Discharge Exam: Vitals:   09/13/18 0527 09/13/18 1435 09/13/18 2141 09/14/18 0547  BP: 132/71 123/62 121/83 127/68  Pulse: 67 (!) 107 98 75  Resp: '18  20 20  ' Temp: 99.3 F (37.4 C) (!) 101.4 F (38.6 C) 99.3 F (37.4 C) 98.5 F (36.9 C)  TempSrc: Oral Oral Oral Oral  SpO2: 97% 97% 94% 98%  Weight:      Height:        General: Pt is alert, awake, not in acute distress Cardiovascular: RRR, S1/S2 +, no rubs, no gallops Respiratory: CTA bilaterally, no wheezing, no rhonchi Abdominal: Soft, NT, ND, bowel sounds + Extremities: no edema, no cyanosis    The results of significant diagnostics from this hospitalization (including imaging, microbiology, ancillary and laboratory) are listed below for reference.     Microbiology: Recent Results (from the past 240 hour(s))  Gastrointestinal Panel by PCR , Stool     Status: Abnormal   Collection Time: 09/11/18 10:10 AM  Result Value Ref Range Status   Campylobacter species NOT DETECTED NOT DETECTED Final   Plesimonas shigelloides NOT DETECTED NOT DETECTED Final   Salmonella species DETECTED (A) NOT DETECTED Final    Comment: RESULT CALLED TO, READ BACK BY AND VERIFIED WITH: NIKKI KITTRELL AT 1227 09/12/18 SDR    Yersinia enterocolitica NOT DETECTED NOT DETECTED Final   Vibrio species NOT DETECTED NOT DETECTED Final   Vibrio cholerae NOT DETECTED NOT DETECTED Final   Enteroaggregative E coli (EAEC) NOT  DETECTED NOT DETECTED Final   Enteropathogenic E coli (EPEC) NOT DETECTED NOT DETECTED Final   Enterotoxigenic E coli (ETEC) NOT DETECTED NOT DETECTED Final   Shiga like toxin producing E coli (STEC) NOT DETECTED NOT DETECTED Final   Shigella/Enteroinvasive E coli (EIEC) NOT DETECTED NOT DETECTED Final   Cryptosporidium NOT DETECTED NOT DETECTED Final   Cyclospora cayetanensis NOT DETECTED NOT DETECTED Final   Entamoeba histolytica NOT DETECTED NOT DETECTED Final   Giardia lamblia NOT DETECTED NOT DETECTED Final   Adenovirus F40/41 NOT DETECTED NOT DETECTED Final   Astrovirus NOT DETECTED NOT DETECTED Final   Norovirus GI/GII NOT DETECTED NOT DETECTED Final   Rotavirus A NOT DETECTED NOT DETECTED Final   Sapovirus (I, II, IV, and V) NOT DETECTED NOT DETECTED Final    Comment: Performed at [facility], [address] [place], [place]  [address]  C difficile quick scan w PCR reflex     Status: None   Collection Time: 09/11/18 10:10 AM  Result Value Ref Range Status   C Diff antigen NEGATIVE NEGATIVE Final   C Diff toxin NEGATIVE NEGATIVE Final   C Diff interpretation No C. difficile detected.  Final    Comment: Performed at [facility], [address] [place], [place] [address]     Labs: BNP (last 3 results) No results for input(s): BNP in the last 8760 hours. Basic Metabolic Panel: Recent Labs  Lab 09/11/18 1016 09/12/18 0648 09/13/18 0613 09/14/18 0527  NA 130* 135 136 137  K 2.8* 3.0* 3.3* 3.4*  CL 97* 105 109 110  CO2 21* 21* 18* 24  GLUCOSE 184* 110* 109* 105*  BUN 25* '18 18 11  ' CREATININE 1.00 0.78 0.99 0.79  CALCIUM 8.6* 8.4* 8.7* 8.2*  MG 1.7  --   --   --    Liver Function Tests: Recent Labs  Lab 09/11/18 1016  AST 47*  ALT 24  ALKPHOS 57  BILITOT 1.2  PROT 7.7  ALBUMIN 3.6   Recent Labs  Lab 09/11/18 1016  LIPASE 45   No results for input(s): AMMONIA in the last 168 hours. CBC: Recent Labs  Lab 09/11/18 1016 09/12/18 0648  WBC 7.8  6.0  NEUTROABS 6.3  --   HGB 13.7 11.9*  HCT 44.7 38.2  MCV 79.1* 79.9*  PLT 165 151   Cardiac Enzymes: Recent Labs  Lab 09/11/18 1016  TROPONINI <0.03   BNP: Invalid input(s): POCBNP CBG: Recent Labs  Lab 09/13/18 1144 09/13/18 1655 09/13/18 2226 09/14/18 0724 09/14/18 1128  GLUCAP 102* 100* 97 93 114*   D-Dimer No results for input(s): DDIMER in the last 72 hours. Hgb A1c No results for input(s): HGBA1C in the last 72 hours. Lipid Profile No results for input(s): CHOL, HDL, LDLCALC, TRIG, CHOLHDL, LDLDIRECT in the last 72 hours. Thyroid function studies No results for input(s): TSH, T4TOTAL, T3FREE, THYROIDAB in the last 72 hours.  Invalid input(s): FREET3 Anemia work up No results for input(s): VITAMINB12, FOLATE, FERRITIN, TIBC, IRON, RETICCTPCT in the last 72 hours. Urinalysis    Component Value Date/Time   COLORURINE YELLOW 09/11/2018 0959   APPEARANCEUR HAZY (A) 09/11/2018 0959   LABSPEC 1.026 09/11/2018 0959   PHURINE 5.0 09/11/2018 0959   GLUCOSEU NEGATIVE 09/11/2018 0959   HGBUR SMALL (A) 09/11/2018 0959   BILIRUBINUR NEGATIVE 09/11/2018 0959   KETONESUR 20 (A) 09/11/2018 0959   PROTEINUR 30 (A) 09/11/2018 0959   UROBILINOGEN 0.2 03/21/2013 0800   NITRITE NEGATIVE 09/11/2018 0959   LEUKOCYTESUR NEGATIVE 09/11/2018 0959   Sepsis Labs Invalid input(s): PROCALCITONIN,  WBC,  LACTICIDVEN Microbiology Recent Results (from the past 240 hour(s))  Gastrointestinal Panel by PCR , Stool     Status: Abnormal   Collection Time: 09/11/18 10:10 AM  Result Value Ref Range Status   Campylobacter species NOT DETECTED NOT DETECTED Final   Plesimonas shigelloides NOT DETECTED NOT DETECTED Final   Salmonella species DETECTED (A) NOT DETECTED Final    Comment: RESULT CALLED TO, READ BACK BY AND VERIFIED WITH: NIKKI KITTRELL AT 1227 09/12/18 SDR    Yersinia enterocolitica NOT DETECTED NOT DETECTED Final   Vibrio species NOT DETECTED NOT DETECTED Final   Vibrio  cholerae NOT DETECTED NOT DETECTED Final   Enteroaggregative E coli (EAEC) NOT DETECTED NOT DETECTED Final   Enteropathogenic E coli (EPEC) NOT DETECTED NOT DETECTED Final  Enterotoxigenic E coli (ETEC) NOT DETECTED NOT DETECTED Final   Shiga like toxin producing E coli (STEC) NOT DETECTED NOT DETECTED Final   Shigella/Enteroinvasive E coli (EIEC) NOT DETECTED NOT DETECTED Final   Cryptosporidium NOT DETECTED NOT DETECTED Final   Cyclospora cayetanensis NOT DETECTED NOT DETECTED Final   Entamoeba histolytica NOT DETECTED NOT DETECTED Final   Giardia lamblia NOT DETECTED NOT DETECTED Final   Adenovirus F40/41 NOT DETECTED NOT DETECTED Final   Astrovirus NOT DETECTED NOT DETECTED Final   Norovirus GI/GII NOT DETECTED NOT DETECTED Final   Rotavirus A NOT DETECTED NOT DETECTED Final   Sapovirus (I, II, IV, and V) NOT DETECTED NOT DETECTED Final    Comment: Performed at [facility], [address] [place], [place] [address]  C difficile quick scan w PCR reflex     Status: None   Collection Time: 09/11/18 10:10 AM  Result Value Ref Range Status   C Diff antigen NEGATIVE NEGATIVE Final   C Diff toxin NEGATIVE NEGATIVE Final   C Diff interpretation No C. difficile detected.  Final    Comment: Performed at [facility], [address], [place], [place] [address]     Time coordinating discharge: 76mns  SIGNED:   JKathie Dike MD  [facility] 09/14/2018, 1:19 PM Pager   If 7PM-7AM, please contact night-coverage [email or web address] Password [email or web address]

## 2018-09-26 LAB — BASIC METABOLIC PANEL
BUN: 12 mg/dL (ref 7–25)
CHLORIDE: 103 mmol/L (ref 98–110)
CO2: 33 mmol/L — AB (ref 20–32)
Calcium: 9.2 mg/dL (ref 8.6–10.4)
Creat: 0.61 mg/dL (ref 0.50–0.99)
Glucose, Bld: 91 mg/dL (ref 65–99)
Potassium: 3.7 mmol/L (ref 3.5–5.3)
SODIUM: 141 mmol/L (ref 135–146)

## 2018-09-28 ENCOUNTER — Other Ambulatory Visit: Payer: Self-pay | Admitting: "Endocrinology

## 2018-10-04 ENCOUNTER — Encounter: Payer: Medicare HMO | Admitting: *Deleted

## 2018-10-04 ENCOUNTER — Ambulatory Visit (HOSPITAL_COMMUNITY)
Admission: RE | Admit: 2018-10-04 | Discharge: 2018-10-04 | Disposition: A | Discharge status: Home / Self Care | Payer: Medicare HMO | Source: Ambulatory Visit | Attending: Physician Assistant | Admitting: Physician Assistant

## 2018-10-04 ENCOUNTER — Telehealth: Payer: Self-pay | Admitting: *Deleted

## 2018-10-04 ENCOUNTER — Ambulatory Visit (HOSPITAL_COMMUNITY): Payer: Medicare HMO

## 2018-10-04 ENCOUNTER — Encounter (HOSPITAL_COMMUNITY): Payer: Self-pay

## 2018-10-04 DIAGNOSIS — I2510 Atherosclerotic heart disease of native coronary artery without angina pectoris: Secondary | ICD-10-CM

## 2018-10-04 DIAGNOSIS — R002 Palpitations: Secondary | ICD-10-CM | POA: Diagnosis not present

## 2018-10-04 DIAGNOSIS — Z006 Encounter for examination for normal comparison and control in clinical research program: Secondary | ICD-10-CM

## 2018-10-04 DIAGNOSIS — R079 Chest pain, unspecified: Secondary | ICD-10-CM | POA: Insufficient documentation

## 2018-10-04 DIAGNOSIS — I350 Nonrheumatic aortic (valve) stenosis: Secondary | ICD-10-CM | POA: Insufficient documentation

## 2018-10-04 MED ORDER — METOPROLOL TARTRATE 5 MG/5ML IV SOLN
INTRAVENOUS | Status: AC
  Filled 2018-10-04: qty 20

## 2018-10-04 MED ORDER — NITROGLYCERIN 0.4 MG SL SUBL
SUBLINGUAL_TABLET | SUBLINGUAL | Status: AC
  Filled 2018-10-04: qty 2

## 2018-10-04 MED ORDER — DILTIAZEM HCL 25 MG/5ML IV SOLN
5.0000 mg | Freq: Once | INTRAVENOUS | Status: AC
  Administered 2018-10-04: 5 mg via INTRAVENOUS
  Filled 2018-10-04: qty 5

## 2018-10-04 MED ORDER — METOPROLOL TARTRATE 5 MG/5ML IV SOLN
INTRAVENOUS | Status: AC
  Filled 2018-10-04: qty 5

## 2018-10-04 MED ORDER — METOPROLOL TARTRATE 5 MG/5ML IV SOLN
10.0000 mg | Freq: Once | INTRAVENOUS | Status: AC
  Administered 2018-10-04: 10 mg via INTRAVENOUS
  Filled 2018-10-04: qty 10

## 2018-10-04 MED ORDER — NITROGLYCERIN 0.4 MG SL SUBL
0.8000 mg | SUBLINGUAL_TABLET | Freq: Once | SUBLINGUAL | Status: AC
  Administered 2018-10-04: 0.8 mg via SUBLINGUAL
  Filled 2018-10-04: qty 25

## 2018-10-04 MED ORDER — METOPROLOL TARTRATE 5 MG/5ML IV SOLN
5.0000 mg | INTRAVENOUS | Status: DC | PRN
  Administered 2018-10-04 (×4): 5 mg via INTRAVENOUS
  Filled 2018-10-04: qty 5

## 2018-10-04 MED ORDER — DILTIAZEM HCL 25 MG/5ML IV SOLN
INTRAVENOUS | Status: AC
  Filled 2018-10-04: qty 5

## 2018-10-04 MED ORDER — IOPAMIDOL (ISOVUE-370) INJECTION 76%
100.0000 mL | Freq: Once | INTRAVENOUS | Status: AC | PRN
  Administered 2018-10-04: 100 mL via INTRAVENOUS

## 2018-10-04 NOTE — Research (Signed)
Subject met inclusion and exclusion criteria.  The informed consent form, study requirements and expectations were reviewed with the subject and questions and concerns were addressed prior to the signing of the consent form.  The subject verbalized understanding of the trial requirements.  The subject agreed to participate in the CADfem G4 trial and signed the informed consent.  The informed consent was obtained prior to performance of any protocol-specific procedures for the subject.  A copy of the signed informed consent was given to the subject and a copy was placed in the subject's medical record.

## 2018-10-04 NOTE — Telephone Encounter (Signed)
-----   Message from Imogene Burn, [place] sent at 10/04/2018  2:42 PM EST ----- Coronary CT gives a calcium score of 8.7 and there is some calcified plaque in the proximal left anterior descending artery with only mild stenosis.  This puts her at intermediate risk for future cardiac events.  It is important that we control all her cardiac risk factors including hypertension hyperlipidemia, and diabetes.  Can refer you review a more detail with Dr. Domenic Polite.

## 2018-10-04 NOTE — Progress Notes (Signed)
CT scan completed. Tolerated well. D/C home in wheelchair with sister. Awake and alert. In no distress.

## 2018-10-04 NOTE — Telephone Encounter (Signed)
Called patient with test results. No answer. Unable to leave msg.  

## 2018-10-05 ENCOUNTER — Other Ambulatory Visit: Payer: Self-pay | Admitting: "Endocrinology

## 2018-10-11 ENCOUNTER — Ambulatory Visit: Payer: Medicare HMO | Admitting: Cardiology

## 2018-10-27 LAB — COMPLETE METABOLIC PANEL WITH GFR
AG RATIO: 1.5 (calc) (ref 1.0–2.5)
ALBUMIN MSPROF: 4 g/dL (ref 3.6–5.1)
ALKALINE PHOSPHATASE (APISO): 64 U/L (ref 33–130)
ALT: 11 U/L (ref 6–29)
AST: 23 U/L (ref 10–35)
BUN: 18 mg/dL (ref 7–25)
CHLORIDE: 103 mmol/L (ref 98–110)
CO2: 30 mmol/L (ref 20–32)
Calcium: 9.4 mg/dL (ref 8.6–10.4)
Creat: 0.76 mg/dL (ref 0.50–0.99)
GFR, EST AFRICAN AMERICAN: 98 mL/min/{1.73_m2} (ref >=60)
GFR, EST NON AFRICAN AMERICAN: 85 mL/min/{1.73_m2} (ref >=60)
GLOBULIN: 2.6 g/dL (ref 1.9–3.7)
Glucose, Bld: 96 mg/dL (ref 65–99)
POTASSIUM: 4.1 mmol/L (ref 3.5–5.3)
SODIUM: 140 mmol/L (ref 135–146)
Total Bilirubin: 0.5 mg/dL (ref 0.2–1.2)
Total Protein: 6.6 g/dL (ref 6.1–8.1)

## 2018-10-27 LAB — T4, FREE: Free T4: 1.2 ng/dL (ref 0.8–1.8)

## 2018-10-27 LAB — HEMOGLOBIN A1C
EAG (MMOL/L): 6.8 (calc)
HEMOGLOBIN A1C: 5.9 %{Hb} — AB (ref <5.7)
MEAN PLASMA GLUCOSE: 123 (calc)

## 2018-10-27 LAB — TSH: TSH: 0.02 mIU/L — ABNORMAL LOW (ref 0.40–4.50)

## 2018-11-02 ENCOUNTER — Ambulatory Visit: Payer: Medicare HMO | Admitting: "Endocrinology

## 2018-11-02 ENCOUNTER — Encounter: Payer: Self-pay | Admitting: "Endocrinology

## 2018-11-02 VITALS — BP 150/89 | HR 91 | Ht 68.0 in | Wt 253.0 lb

## 2018-11-02 DIAGNOSIS — E0590 Thyrotoxicosis, unspecified without thyrotoxic crisis or storm: Secondary | ICD-10-CM | POA: Diagnosis not present

## 2018-11-02 DIAGNOSIS — E118 Type 2 diabetes mellitus with unspecified complications: Secondary | ICD-10-CM | POA: Diagnosis not present

## 2018-11-02 DIAGNOSIS — Z794 Long term (current) use of insulin: Secondary | ICD-10-CM

## 2018-11-02 DIAGNOSIS — E782 Mixed hyperlipidemia: Secondary | ICD-10-CM

## 2018-11-02 DIAGNOSIS — I10 Essential (primary) hypertension: Secondary | ICD-10-CM

## 2018-11-02 NOTE — Progress Notes (Signed)
Endocrinology follow-up note   Subjective:    Patient ID: [name], female    DOB: 01/25/1956. Patient is being seen in f/u for management of type 2 diabetes, hypothyroidism, hyperlipidemia, hypertension. PMD:   Stephens Shire, MD  Past Medical History:  Diagnosis Date  . Anxiety   . Essential hypertension 2009  . GERD (gastroesophageal reflux disease)   . Iron deficiency anemia   . Osteoarthritis   . Palpitations    Tachypalpitations on beta blockers since 2010  . Type 2 diabetes mellitus ([place]) 2003  . Vitamin D deficiency    Past Surgical History:  Procedure Laterality Date  . ABDOMINAL HYSTERECTOMY     History of cervical cancer  . TONSILLECTOMY     Social History   Socioeconomic History  . Marital status: Single    Spouse name: Not on file  . Number of children: 2  . Years of education: Not on file  . Highest education level: Not on file  Occupational History  . Occupation: DISABLED    Employer: UNEMPLOYED    Comment: OSTEOARTHRITIS  Social Needs  . Financial resource strain: Not on file  . Food insecurity:    Worry: Not on file    Inability: Not on file  . Transportation needs:    Medical: Not on file    Non-medical: Not on file  Tobacco Use  . Smoking status: Never Smoker  . Smokeless tobacco: Never Used  Substance and Sexual Activity  . Alcohol use: No  . Drug use: No  . Sexual activity: Never  Lifestyle  . Physical activity:    Days per week: Not on file    Minutes per session: Not on file  . Stress: Not on file  Relationships  . Social connections:    Talks on phone: Not on file    Gets together: Not on file    Attends religious service: Not on file    Active member of club or organization: Not on file    Attends meetings of clubs or organizations: Not on file    Relationship status: Not on file  Other Topics Concern  . Not on file  Social History Narrative   Has 2 grandchildren. 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 blood glucose meter kit and supplies KIT Dispense based on patient and insurance preference. Use up to four times daily as directed. (FOR ICD-10 E11.65)  . Cholecalciferol (VITAMIN D3) 5000 units TABS Take 1 tablet by mouth daily.   Marland Kitchen FERROUSUL 325 (65 Fe) MG tablet Take 1 tablet by mouth daily.  Marland Kitchen gabapentin (NEURONTIN) 400 MG capsule Take 400 mg by mouth 3 (three) times daily.  . Insulin Glargine (LANTUS SOLOSTAR [place]) Inject 15 Units into the skin at bedtime.  . isosorbide mononitrate (IMDUR) 30 MG 24 hr tablet Take 1 tablet (30 mg total) by mouth daily.  Marland Kitchen losartan (COZAAR) 100 MG tablet Take 100 mg by mouth daily.  . magnesium oxide (MAG-OX) 400 MG tablet Take 400 mg by mouth 2 (two) times daily.  . meloxicam (MOBIC) 15 MG tablet Take 15 mg by mouth at bedtime.  . metFORMIN (GLUCOPHAGE) 1000 MG tablet TAKE 1 TABLET TWICE DAILY  . pantoprazole (PROTONIX) 40 MG tablet Take 40 mg by mouth daily.    . pravastatin (PRAVACHOL) 20 MG tablet Take 20 mg by mouth daily.  . TRULICITY 1.5 TT/0.1XB SOPN  INJECT  1.5MG  INTO  THE  SKIN EVERY WEEK  . [DISCONTINUED] LANTUS SOLOSTAR 100 UNIT/ML Solostar Pen INJECT  30 UNITS SUBCUTANEOUSLY AT BEDTIME  . [DISCONTINUED] metoprolol tartrate (LOPRESSOR) 50 MG tablet Take one hour before your test  . [DISCONTINUED] predniSONE (DELTASONE) 50 MG tablet Take 1 Tablet 13 hours prior to test, another 7 hours prior to test , and the last 1 hour prior to test   No facility-administered encounter medications on file as of 11/02/2018.    ALLERGIES: Allergies  Allergen Reactions  . Contrast Media [Iodinated Diagnostic Agents]   . Sulfa Antibiotics Nausea And Vomiting    Other reaction(s): GI Upset (intolerance) unknown  . Doxepin Rash and Swelling   VACCINATION STATUS: Immunization History  Administered Date(s) Administered  . Tdap 11/24/2007    Diabetes  She presents for her  follow-up diabetic visit. She has type 2 diabetes mellitus. Onset time: She was diagnosed at approximate age of 35 years. Her disease course has been improving. There are no hypoglycemic associated symptoms. Pertinent negatives for hypoglycemia include no confusion, headaches, pallor or seizures. Pertinent negatives for diabetes include no blurred vision, no chest pain, no fatigue, no polydipsia, no polyphagia and no polyuria. There are no hypoglycemic complications. Symptoms are improving. Diabetic complications include peripheral neuropathy. Risk factors for coronary artery disease include diabetes mellitus, dyslipidemia, hypertension, obesity, family history and sedentary lifestyle. Current diabetic treatment includes intensive insulin program and oral agent (monotherapy). Her weight is decreasing steadily (She has achieved 50 pounds of weight loss since August 2018.). She is following a generally unhealthy diet. When asked about meal planning, she reported none. She has not had a previous visit with a dietitian. She never participates in exercise. Her breakfast blood glucose range is generally 130-140 mg/dl. Her overall blood glucose range is 130-140 mg/dl. (She returns with continued improvement in her glycemic profile with A1c of 5.9%.   ) An ACE inhibitor/angiotensin II receptor blocker is being taken. Eye exam is current.  Hyperlipidemia  This is a chronic problem. The current episode started more than 1 year ago. The problem is controlled. Exacerbating diseases include diabetes and obesity. Pertinent negatives include no chest pain, myalgias or shortness of breath. Current antihyperlipidemic treatment includes statins. Risk factors for coronary artery disease include dyslipidemia, diabetes mellitus, hypertension, obesity, a sedentary lifestyle, family history and post-menopausal.  Hypertension  This is a chronic problem. The current episode started more than 1 year ago. The problem is uncontrolled.  Pertinent negatives include no blurred vision, chest pain, headaches, palpitations or shortness of breath. Risk factors for coronary artery disease include diabetes mellitus, dyslipidemia, obesity, family history and sedentary lifestyle. Past treatments include angiotensin blockers.     Review of Systems  Constitutional: Negative for chills, fatigue, fever and unexpected weight change.  HENT: Negative for trouble swallowing and voice change.   Eyes: Negative for blurred vision and visual disturbance.  Respiratory: Negative for cough, shortness of breath and wheezing.   Cardiovascular: Negative for chest pain, palpitations and leg swelling.  Gastrointestinal: Negative for diarrhea, nausea and vomiting.  Endocrine: Negative for cold intolerance, heat intolerance, polydipsia, polyphagia and polyuria.  Musculoskeletal: Negative for arthralgias and myalgias.  Skin: Negative for color change, pallor, rash and wound.  Neurological: Negative for seizures and headaches.  Psychiatric/Behavioral: Negative for confusion and suicidal ideas.    Objective:    BP (!) 150/89   Pulse 91   Ht _0  (1.727 m)   Wt 253 lb (114.8 kg)  BMI 38.47 kg/m   Wt Readings from Last 3 Encounters:  11/02/18 253 lb (114.8 kg)  09/11/18 249 lb 12.5 oz (113.3 kg)  08/22/18 250 lb (113.4 kg)    Physical Exam  Constitutional: She appears well-developed.  HENT:  Head: Normocephalic and atraumatic.  Edentulous.   Eyes: EOM are normal.  Neck: Normal range of motion. Neck supple. No tracheal deviation present. No thyromegaly present.  Cardiovascular: Normal rate.  Mild tachycardia.  Pulmonary/Chest: Effort normal.  Abdominal: There is no tenderness. There is no guarding.  Musculoskeletal: Normal range of motion. She exhibits no edema.  Neurological: She is alert. She has normal reflexes. No cranial nerve deficit. Coordination normal.  Skin: Skin is warm and dry. No rash noted. No erythema. No pallor.   Psychiatric: She has a normal mood and affect. Judgment normal.   Recent Results (from the past 2160 hour(s))  Urinalysis, Routine w reflex microscopic     Status: Abnormal   Collection Time: 09/11/18  9:59 AM  Result Value Ref Range   Color, Urine YELLOW YELLOW   APPearance HAZY (A) CLEAR   Specific Gravity, Urine 1.026 1.005 - 1.030   pH 5.0 5.0 - 8.0   Glucose, UA NEGATIVE NEGATIVE mg/dL   Hgb urine dipstick SMALL (A) NEGATIVE   Bilirubin Urine NEGATIVE NEGATIVE   Ketones, ur 20 (A) NEGATIVE mg/dL   Protein, ur 30 (A) NEGATIVE mg/dL   Nitrite NEGATIVE NEGATIVE   Leukocytes, UA NEGATIVE NEGATIVE   RBC / HPF 0-5 0 - 5 RBC/hpf   WBC, UA 0-5 0 - 5 WBC/hpf   Bacteria, UA NONE SEEN NONE SEEN   Squamous Epithelial / LPF 6-10 0 - 5   Mucus PRESENT     Comment: Performed at [facility], [address], [place], [place] [address]  Gastrointestinal Panel by PCR , Stool     Status: Abnormal   Collection Time: 09/11/18 10:10 AM  Result Value Ref Range   Campylobacter species NOT DETECTED NOT DETECTED   Plesimonas shigelloides NOT DETECTED NOT DETECTED   Salmonella species DETECTED (A) NOT DETECTED    Comment: RESULT CALLED TO, READ BACK BY AND VERIFIED WITH: NIKKI KITTRELL AT 1227 09/12/18 SDR    Yersinia enterocolitica NOT DETECTED NOT DETECTED   Vibrio species NOT DETECTED NOT DETECTED   Vibrio cholerae NOT DETECTED NOT DETECTED   Enteroaggregative E coli (EAEC) NOT DETECTED NOT DETECTED   Enteropathogenic E coli (EPEC) NOT DETECTED NOT DETECTED   Enterotoxigenic E coli (ETEC) NOT DETECTED NOT DETECTED   Shiga like toxin producing E coli (STEC) NOT DETECTED NOT DETECTED   Shigella/Enteroinvasive E coli (EIEC) NOT DETECTED NOT DETECTED   Cryptosporidium NOT DETECTED NOT DETECTED   Cyclospora cayetanensis NOT DETECTED NOT DETECTED   Entamoeba histolytica NOT DETECTED NOT DETECTED   Giardia lamblia NOT DETECTED NOT DETECTED   Adenovirus F40/41 NOT DETECTED NOT DETECTED    Astrovirus NOT DETECTED NOT DETECTED   Norovirus GI/GII NOT DETECTED NOT DETECTED   Rotavirus A NOT DETECTED NOT DETECTED   Sapovirus (I, II, IV, and V) NOT DETECTED NOT DETECTED    Comment: Performed at [facility], [address] [place], [place] [address]  C difficile quick scan w PCR reflex     Status: None   Collection Time: 09/11/18 10:10 AM  Result Value Ref Range   C Diff antigen NEGATIVE NEGATIVE   C Diff toxin NEGATIVE NEGATIVE   C Diff interpretation No C. difficile detected.  Comment: Performed at [facility], [address] [place], [place] [address]  Lipase, blood     Status: None   Collection Time: 09/11/18 10:16 AM  Result Value Ref Range   Lipase 45 11 - 51 U/L    Comment: Performed at [facility], [address], [place], [place] [address]  Comprehensive metabolic panel     Status: Abnormal   Collection Time: 09/11/18 10:16 AM  Result Value Ref Range   Sodium 130 (L) 135 - 145 mmol/L   Potassium 2.8 (L) 3.5 - 5.1 mmol/L   Chloride 97 (L) 98 - 111 mmol/L   CO2 21 (L) 22 - 32 mmol/L   Glucose, Bld 184 (H) 70 - 99 mg/dL   BUN 25 (H) 6 - 20 mg/dL   Creatinine, Ser 1.00 0.44 - 1.00 mg/dL   Calcium 8.6 (L) 8.9 - 10.3 mg/dL   Total Protein 7.7 6.5 - 8.1 g/dL   Albumin 3.6 3.5 - 5.0 g/dL   AST 47 (H) 15 - 41 U/L   ALT 24 0 - 44 U/L   Alkaline Phosphatase 57 38 - 126 U/L   Total Bilirubin 1.2 0.3 - 1.2 mg/dL   GFR calc non Af Amer 60 (L) >60 mL/min   GFR calc Af Amer >60 >60 mL/min    Comment: (NOTE) The eGFR has been calculated using the CKD EPI equation. This calculation has not been validated in all clinical situations. eGFR's persistently <60 mL/min signify possible Chronic Kidney Disease.    Anion gap 12 5 - 15    Comment: Performed at [facility], [address], [place], [place] [address]  Troponin I     Status: None   Collection Time: 09/11/18 10:16 AM  Result Value Ref Range   Troponin I <0.03 <0.03 ng/mL    Comment: Performed at [facility], [address], [place], [place] [address]  CBC with Differential     Status: Abnormal   Collection Time: 09/11/18 10:16 AM  Result Value Ref Range   WBC 7.8 4.0 - 10.5 K/uL    Comment: WHITE COUNT CONFIRMED ON SMEAR   RBC 5.65 (H) 3.87 - 5.11 MIL/uL   Hemoglobin 13.7 12.0 - 15.0 g/dL   HCT 44.7 36.0 - 46.0 %   MCV 79.1 (L) 80.0 - 100.0 fL   MCH 24.2 (L) 26.0 - 34.0 pg   MCHC 30.6 30.0 - 36.0 g/dL   RDW 14.6 11.5 - 15.5 %   Platelets 165 150 - 400 K/uL   nRBC 0.0 0.0 - 0.2 %   Neutrophils Relative % 81 %   Neutro Abs 6.3 1.7 - 7.7 K/uL   Lymphocytes Relative 10 %   Lymphs Abs 0.8 0.7 - 4.0 K/uL   Monocytes Relative 7 %   Monocytes Absolute 0.5 0.1 - 1.0 K/uL   Eosinophils Relative 2 %   Eosinophils Absolute 0.2 0.0 - 0.5 K/uL   Basophils Relative 0 %   Basophils Absolute 0.0 0.0 - 0.1 K/uL   WBC Morphology DOHLE BODIES     Comment: INCREASED BANDS (>20% BANDS)   Immature Granulocytes 0 %   Abs Immature Granulocytes 0.03 0.00 - 0.07 K/uL   Reactive, Benign Lymphocytes PRESENT     Comment: Performed at [facility], [address] [place], [place] [address]  Magnesium     Status: None   Collection Time: 09/11/18 10:16 AM  Result Value Ref Range   Magnesium 1.7 1.7 - 2.4 mg/dL    Comment:  Performed at [facility], [address], [place], [place] [address]  Glucose, capillary     Status: Abnormal   Collection Time: 09/11/18  5:08 PM  Result Value Ref Range   Glucose-Capillary 129 (H) 70 - 99 mg/dL  Glucose, capillary     Status: Abnormal   Collection Time: 09/11/18  9:37 PM  Result Value Ref Range   Glucose-Capillary 113 (H) 70 - 99 mg/dL  HIV antibody (Routine Testing)     Status: None   Collection Time: 09/12/18  6:48 AM  Result Value Ref Range   HIV Screen 4th Generation wRfx Non Reactive Non Reactive    Comment: (NOTE) Performed At: [facility] [place], [place] [id number] Rush Farmer MD FX:[id number]   Basic metabolic panel      Status: Abnormal   Collection Time: 09/12/18  6:48 AM  Result Value Ref Range   Sodium 135 135 - 145 mmol/L   Potassium 3.0 (L) 3.5 - 5.1 mmol/L   Chloride 105 98 - 111 mmol/L   CO2 21 (L) 22 - 32 mmol/L   Glucose, Bld 110 (H) 70 - 99 mg/dL   BUN 18 6 - 20 mg/dL   Creatinine, Ser 0.78 0.44 - 1.00 mg/dL   Calcium 8.4 (L) 8.9 - 10.3 mg/dL   GFR calc non Af Amer >60 >60 mL/min   GFR calc Af Amer >60 >60 mL/min    Comment: (NOTE) The eGFR has been calculated using the CKD EPI equation. This calculation has not been validated in all clinical situations. eGFR's persistently <60 mL/min signify possible Chronic Kidney Disease.    Anion gap 9 5 - 15    Comment: Performed at [facility], [address] [place], [place] [address]  CBC     Status: Abnormal   Collection Time: 09/12/18  6:48 AM  Result Value Ref Range   WBC 6.0 4.0 - 10.5 K/uL   RBC 4.78 3.87 - 5.11 MIL/uL   Hemoglobin 11.9 (L) 12.0 - 15.0 g/dL   HCT 38.2 36.0 - 46.0 %   MCV 79.9 (L) 80.0 - 100.0 fL   MCH 24.9 (L) 26.0 - 34.0 pg   MCHC 31.2 30.0 - 36.0 g/dL   RDW 14.8 11.5 - 15.5 %   Platelets 151 150 - 400 K/uL   nRBC 0.0 0.0 - 0.2 %    Comment: Performed at [facility], [address], [place], [place] [address]  Glucose, capillary     Status: Abnormal   Collection Time: 09/12/18  7:38 AM  Result Value Ref Range   Glucose-Capillary 102 (H) 70 - 99 mg/dL   Comment 1 Notify RN    Comment 2 Document in Chart   Glucose, capillary     Status: Abnormal   Collection Time: 09/12/18 11:35 AM  Result Value Ref Range   Glucose-Capillary 105 (H) 70 - 99 mg/dL   Comment 1 Notify RN    Comment 2 Document in Chart   Glucose, capillary     Status: None   Collection Time: 09/12/18  4:43 PM  Result Value Ref Range   Glucose-Capillary 82 70 - 99 mg/dL  Glucose, capillary     Status: Abnormal   Collection Time: 09/12/18  9:33 PM  Result Value Ref Range   Glucose-Capillary 103 (H) 70 - 99 mg/dL  Basic metabolic panel      Status: Abnormal   Collection Time: 09/13/18  6:13 AM  Result Value Ref Range   Sodium 136 135 -  145 mmol/L   Potassium 3.3 (L) 3.5 - 5.1 mmol/L   Chloride 109 98 - 111 mmol/L   CO2 18 (L) 22 - 32 mmol/L   Glucose, Bld 109 (H) 70 - 99 mg/dL   BUN 18 6 - 20 mg/dL   Creatinine, Ser 0.99 0.44 - 1.00 mg/dL   Calcium 8.7 (L) 8.9 - 10.3 mg/dL   GFR calc non Af Amer >60 >60 mL/min   GFR calc Af Amer >60 >60 mL/min    Comment: (NOTE) The eGFR has been calculated using the CKD EPI equation. This calculation has not been validated in all clinical situations. eGFR's persistently <60 mL/min signify possible Chronic Kidney Disease.    Anion gap 9 5 - 15    Comment: Performed at [facility], [address] [place], [place] [address]  Glucose, capillary     Status: None   Collection Time: 09/13/18  7:47 AM  Result Value Ref Range   Glucose-Capillary 99 70 - 99 mg/dL   Comment 1 Notify RN    Comment 2 Document in Chart   Glucose, capillary     Status: Abnormal   Collection Time: 09/13/18 11:44 AM  Result Value Ref Range   Glucose-Capillary 102 (H) 70 - 99 mg/dL   Comment 1 Notify RN    Comment 2 Document in Chart   Glucose, capillary     Status: Abnormal   Collection Time: 09/13/18  4:55 PM  Result Value Ref Range   Glucose-Capillary 100 (H) 70 - 99 mg/dL   Comment 1 Notify RN    Comment 2 Document in Chart   Glucose, capillary     Status: None   Collection Time: 09/13/18 10:26 PM  Result Value Ref Range   Glucose-Capillary 97 70 - 99 mg/dL   Comment 1 Notify RN    Comment 2 Document in Chart   Basic metabolic panel     Status: Abnormal   Collection Time: 09/14/18  5:27 AM  Result Value Ref Range   Sodium 137 135 - 145 mmol/L   Potassium 3.4 (L) 3.5 - 5.1 mmol/L   Chloride 110 98 - 111 mmol/L   CO2 24 22 - 32 mmol/L   Glucose, Bld 105 (H) 70 - 99 mg/dL   BUN 11 6 - 20 mg/dL   Creatinine, Ser 0.79 0.44 - 1.00 mg/dL   Calcium 8.2 (L) 8.9 - 10.3 mg/dL   GFR calc non Af Amer >60  >60 mL/min   GFR calc Af Amer >60 >60 mL/min    Comment: (NOTE) The eGFR has been calculated using the CKD EPI equation. This calculation has not been validated in all clinical situations. eGFR's persistently <60 mL/min signify possible Chronic Kidney Disease.    Anion gap 3 (L) 5 - 15    Comment: Performed at [facility], [address] [place], [place] [address]  Glucose, capillary     Status: None   Collection Time: 09/14/18  7:24 AM  Result Value Ref Range   Glucose-Capillary 93 70 - 99 mg/dL   Comment 1 Notify RN    Comment 2 Document in Chart   Glucose, capillary     Status: Abnormal   Collection Time: 09/14/18 11:28 AM  Result Value Ref Range   Glucose-Capillary 114 (H) 70 - 99 mg/dL   Comment 1 Notify RN    Comment 2 Document in Chart   Basic Metabolic Panel (BMET)     Status: Abnormal   Collection Time: 09/26/18  7:54  AM  Result Value Ref Range   Glucose, Bld 91 65 - 99 mg/dL    Comment: .            Fasting reference interval .    BUN 12 7 - 25 mg/dL   Creat 0.61 0.50 - 0.99 mg/dL    Comment: For patients >75 years of age, the reference limit for Creatinine is approximately 13% higher for people identified as African-American. .    BUN/Creatinine Ratio NOT APPLICABLE 6 - 22 (calc)   Sodium 141 135 - 146 mmol/L   Potassium 3.7 3.5 - 5.3 mmol/L   Chloride 103 98 - 110 mmol/L   CO2 33 (H) 20 - 32 mmol/L   Calcium 9.2 8.6 - 10.4 mg/dL  Hemoglobin A1c     Status: Abnormal   Collection Time: 10/26/18  8:06 AM  Result Value Ref Range   Hgb A1c MFr Bld 5.9 (H) <5.7 % of total Hgb    Comment: For someone without known diabetes, a hemoglobin  A1c value between 5.7% and 6.4% is consistent with prediabetes and should be confirmed with a  follow-up test. . For someone with known diabetes, a value <7% indicates that their diabetes is well controlled. A1c targets should be individualized based on duration of diabetes, age, comorbid conditions, and  other considerations. . This assay result is consistent with an increased risk of diabetes. . Currently, no consensus exists regarding use of hemoglobin A1c for diagnosis of diabetes for children. .    Mean Plasma Glucose 123 (calc)   eAG (mmol/L) 6.8 (calc)  COMPLETE METABOLIC PANEL WITH GFR     Status: None   Collection Time: 10/26/18  8:06 AM  Result Value Ref Range   Glucose, Bld 96 65 - 99 mg/dL    Comment: .            Fasting reference interval .    BUN 18 7 - 25 mg/dL   Creat 0.76 0.50 - 0.99 mg/dL    Comment: For patients >72 years of age, the reference limit for Creatinine is approximately 13% higher for people identified as African-American. .    GFR, Est Non African American 85 > OR = 60 mL/min/1.67m   GFR, Est African American 98 > OR = 60 mL/min/1.770m  BUN/Creatinine Ratio NOT APPLICABLE 6 - 22 (calc)   Sodium 140 135 - 146 mmol/L   Potassium 4.1 3.5 - 5.3 mmol/L   Chloride 103 98 - 110 mmol/L   CO2 30 20 - 32 mmol/L   Calcium 9.4 8.6 - 10.4 mg/dL   Total Protein 6.6 6.1 - 8.1 g/dL   Albumin 4.0 3.6 - 5.1 g/dL   Globulin 2.6 1.9 - 3.7 g/dL (calc)   AG Ratio 1.5 1.0 - 2.5 (calc)   Total Bilirubin 0.5 0.2 - 1.2 mg/dL   Alkaline phosphatase (APISO) 64 33 - 130 U/L   AST 23 10 - 35 U/L   ALT 11 6 - 29 U/L  TSH     Status: Abnormal   Collection Time: 10/26/18  8:06 AM  Result Value Ref Range   TSH 0.02 (L) 0.40 - 4.50 mIU/L  T4, free     Status: None   Collection Time: 10/26/18  8:06 AM  Result Value Ref Range   Free T4 1.2 0.8 - 1.8 ng/dL    A1c from a April  4 was 12%.  Lipid Panel     Component Value Date/Time   CHOL 134 02/24/2017  TRIG 87 02/24/2017   HDL 49 02/24/2017   LDLCALC 75 02/24/2017     Assessment & Plan:   1. Type 2 diabetes mellitus with complication, with long-term current use of insulin ([place]) - Patient has currently controlled ype 2 DM since  62 years of age. - She came with continued improvement in her glycemic  profile with A1c of 5.9%, progressively improving from 12%.     Recent labs reviewed.   Her diabetes is complicated by obesity/sedentary life, peripheral neuropathy and patient remains at a high risk for more acute and chronic complications which include CAD, CVA, CKD, retinopathy, and neuropathy. These are all discussed in detail with the patient.  - I have counseled the patient on diet management and weight loss, by adopting a carbohydrate restricted/protein rich diet.  -  Suggestion is made for her to avoid simple carbohydrates  from her diet including Cakes, Sweet Desserts / Pastries, Ice Cream, Soda (diet and regular), Sweet Tea, Candies, Chips, Cookies, Store Bought Juices, Alcohol in Excess of  1-2 drinks a day, Artificial Sweeteners, and "Sugar-free" Products. This will help patient to have stable blood glucose profile and potentially avoid unintended weight gain.  - I encouraged the patient to switch to  unprocessed or minimally processed complex starch and increased protein intake (animal or plant source), fruits, and vegetables.  - Patient is advised to stick to a routine mealtimes to eat 3 meals  a day and avoid unnecessary snacks ( to snack only to correct hypoglycemia).    - I have approached patient with the following individualized plan to manage diabetes and patient agrees:   -She is advised to lower her Lantus to 15  units daily at bedtime , advised to continue to hold  NovoLog for now, continue to monitor blood glucose 2 times daily-before breakfast and at bedtime.   -Adjustment parameters are given for hypo and hyperglycemia in writing. -Patient is encouraged to call clinic for blood glucose levels less than 70 or above 200 mg /dl. -She is advised to continue metformin 1000 mg by mouth twice a day, therapeutically suitable for patient . -She is  advised  to continue Trulicity to 1.5 mg subcutaneously weekly.    - Patient specific target  A1c;  LDL, HDL, Triglycerides, and   Waist Circumference were discussed in detail.  2) BP/HTN: Her blood pressure is not controlled to target.  She has enough medications.   She is advised to continue her current blood pressure medications including losartan 100 mg p.o. daily.   3) Lipids/HPL: Recent lipid panel showed controlled LDL at 75.  She is advised to continue pravastatin 20 mg p.o. Nightly.  4)  Weight/Diet: CDE Consult has been initiated , exercise, and detailed carbohydrates information provided.  5) history of hyperthyroidism:  -She is status post ablative therapy with I-131 for hyperthyroidism on May 16, 2018.  Her previsit labs are consistent with treatment effect, however not hypothyroid range yet. She will not be initiated on thyroid hormone replacement for now.    6) Chronic Care/Health Maintenance:  -Patient is on ACEI/ARB and Statin medications and encouraged to continue to follow up with Ophthalmology, Podiatrist at least yearly or according to recommendations, and advised to   stay away from smoking. I have recommended yearly flu vaccine and pneumonia vaccination at least every 5 years; moderate intensity exercise for up to 150 minutes weekly; and  sleep for at least 7 hours a day.  - I advised patient to maintain close follow  up with Stephens Shire, MD for primary care needs.  - Time spent with the patient: 25 min, of which >50% was spent in reviewing her blood glucose logs , discussing her hypo- and hyper-glycemic episodes, reviewing her current and  previous labs and insulin doses and developing a plan to avoid hypo- and hyper-glycemia. Please refer to Patient Instructions for Blood Glucose Monitoring and Insulin/Medications Dosing Guide"  in media tab for additional information. Fawna E Sol participated in the discussions, expressed understanding, and voiced agreement with the above plans.  All questions were answered to her satisfaction. she is encouraged to contact clinic should she have any questions or  concerns prior to her return visit.   Follow up plan: - Return in about 4 months (around 03/04/2019) for Follow up with Pre-visit Labs, Meter, and Logs.  Glade Lloyd, MD Phone: [phone]  Fax: [phone]  -  This note was partially dictated with voice recognition software. Similar sounding words can be transcribed inadequately or may not  be corrected upon review.  11/02/2018, 10:51 AM

## 2018-11-02 NOTE — Patient Instructions (Signed)

## 2018-12-01 ENCOUNTER — Other Ambulatory Visit: Payer: Self-pay | Admitting: "Endocrinology

## 2018-12-12 ENCOUNTER — Other Ambulatory Visit: Payer: Self-pay | Admitting: "Endocrinology

## 2018-12-16 ENCOUNTER — Other Ambulatory Visit: Payer: Self-pay | Admitting: "Endocrinology

## 2019-01-03 ENCOUNTER — Encounter: Payer: Self-pay | Admitting: Cardiology

## 2019-01-03 NOTE — Progress Notes (Signed)
Cardiology Office Note  Date: 01/04/2019   ID: [name], DOB [birth year], MRN [id number]  PCP: Carrie Shire, MD  Primary Cardiologist: Carrie Lesches, MD   Chief Complaint  Patient presents with  . Cardiac follow-up    History of Present Illness: [name] is a 63 y.o. female last seen by Ms. [name] in September 2019.  He is here for a routine visit.  Since I saw her initially she underwent subsequent cardiac testing and ultimately a cardiac CT as detailed below from November 2019.  This study did show and a calcium score of 8.7 suggesting increased risk of cardiac events, however she had only mild atherosclerosis within the LAD.  She is on Pravachol and losartan, I did ask her to consider taking an aspirin 81 mg daily.  We also discussed seeing if she could come off of Imdur.  She states that she enjoys walking for exercise.  She continues to follow with PCP for management of diabetes mellitus.  Her most recent lab work is detailed below.  Past Medical History:  Diagnosis Date  . Anxiety   . Coronary atherosclerosis    Cardiac CT 09/2018 with calcium score 8.7 and mild proximal LAD disease  . Essential hypertension 2009  . GERD (gastroesophageal reflux disease)   . Iron deficiency anemia   . Osteoarthritis   . Palpitations    Tachypalpitations on beta blockers since 2010  . Type 2 diabetes mellitus ([place]) 2003  . Vitamin D deficiency     Past Surgical History:  Procedure Laterality Date  . ABDOMINAL HYSTERECTOMY     History of cervical cancer  . TONSILLECTOMY      Current Outpatient Medications  Medication Sig Dispense Refill  . aspirin EC 81 MG tablet Take 81 mg by mouth daily.    . blood glucose meter kit and supplies KIT Dispense based on patient and insurance preference. Use up to four times daily as directed. (FOR ICD-10 E11.65) 1 each 0  . Cholecalciferol (VITAMIN D3) 5000 units TABS Take 1 tablet by mouth daily.     . Cobalamin Combinations  (B-12) [phone] MCG SUBL Place under the tongue.    . DULoxetine (CYMBALTA) 30 MG capsule Take 30 mg by mouth daily.    Marland Kitchen FERROUSUL 325 (65 Fe) MG tablet Take 1 tablet by mouth daily.    Marland Kitchen gabapentin (NEURONTIN) 400 MG capsule Take 400 mg by mouth 3 (three) times daily.    . Insulin Glargine (LANTUS SOLOSTAR [place]) Inject 15 Units into the skin at bedtime.    . Insulin Glargine (LANTUS SOLOSTAR) 100 UNIT/ML Solostar Pen Inject 15 Units into the skin at bedtime. 15 mL 2  . losartan (COZAAR) 100 MG tablet Take 100 mg by mouth daily.    . magnesium oxide (MAG-OX) 400 MG tablet Take 400 mg by mouth 4 (four) times daily.     . meloxicam (MOBIC) 15 MG tablet Take 15 mg by mouth at bedtime.    . metFORMIN (GLUCOPHAGE) 1000 MG tablet TAKE (1) TABLET BY MOUTH TWICE DAILY. 60 tablet 2  . pantoprazole (PROTONIX) 40 MG tablet Take 40 mg by mouth daily.      . pravastatin (PRAVACHOL) 20 MG tablet Take 20 mg by mouth daily.    . TRULICITY 1.5 ZR/0.0TM SOPN INJECT  1.5MG  INTO  THE  SKIN EVERY WEEK 6 mL 1   No current facility-administered medications for this visit.    Allergies:  Contrast media [iodinated  diagnostic agents]; Sulfa antibiotics; and Doxepin   Social History: The patient  reports that she has never smoked. She has never used smokeless tobacco. She reports that she does not drink alcohol or use drugs.  ROS:  Please see the history of present illness. Otherwise, complete review of systems is positive for none.  All other systems are reviewed and negative.   Physical Exam: VS:  BP (!) 148/92   Pulse 97   Ht '5\' 8"'  (1.727 m)   Wt 256 lb (116.1 kg)   SpO2 97%   BMI 38.92 kg/m , BMI Body mass index is 38.92 kg/m.  Wt Readings from Last 3 Encounters:  01/04/19 256 lb (116.1 kg)  11/02/18 253 lb (114.8 kg)  09/11/18 249 lb 12.5 oz (113.3 kg)    General: Patient appears comfortable at rest. HEENT: Conjunctiva and lids normal, oropharynx clear. Neck: Supple, no elevated JVP or carotid  bruits, no thyromegaly. Lungs: Clear to auscultation, nonlabored breathing at rest. Cardiac: Regular rate and rhythm, no S3, 2/6 systolic murmur, no pericardial rub. Abdomen: Soft, nontender, bowel sounds present, no guarding or rebound. Extremities: No pitting edema, distal pulses 2+. Skin: Warm and dry. Musculoskeletal: No kyphosis. Neuropsychiatric: Alert and oriented x3, affect grossly appropriate.  ECG: I personally reviewed the tracing from 09/11/2018 which showed sinus tachycardia.  Recent Labwork: 09/11/2018: Magnesium 1.7 09/12/2018: Hemoglobin 11.9; Platelets 151 10/26/2018: ALT 11; AST 23; BUN 18; Creat 0.76; Potassium 4.1; Sodium 140; TSH 0.02     Component Value Date/Time   CHOL 134 02/24/2017   TRIG 87 02/24/2017   HDL 49 02/24/2017   [place] 75 02/24/2017    Other Studies Reviewed Today:  Cardiac CT 10/04/2018: FINDINGS: Non-cardiac: See separate report from [facility].  Pulmonary veins drain normally to the left atrium.  Calcium Score: 8.7 Agatston units.  Coronary Arteries: Right dominant with no anomalies  LM: No plaque or stenosis.  LAD system: Interpretation limited by motion artifact, but there is calcified plaque in the proximal LAD with mild (<50%) stenosis.  Circumflex system: Interpretation limited by motion artifact, but there does not appear to be significant disease.  RCA system: No plaque or stenosis.  IMPRESSION: 1. Coronary artery calcium score 8.7 Agatston units, placing the patient in the 68th percentile for age and gender, suggesting intermediate risk for future cardiac events.  2. Technically difficult study with motion artifact but there does not appear to be significant coronary stenosis present (suspect mild proximal LAD stenosis).  Assessment and Plan:  1.  Mild proximal LAD atherosclerosis by coronary CT with calcium score 8.7.  Recommend medical therapy and risk factor modification.  Adding aspirin 81  mg daily, continue Pravachol and aim for LDL 70 or less with follow-up per PCP.  She is also on losartan.  We will try and come off of Imdur at this point.  2.  Mild aortic stenosis with corresponding heart murmur.  She is asymptomatic.  No clear indication for follow-up echocardiogram at this time.  3.  Essential hypertension, no changes made in present regimen from a antihypertensive perspective.  4.  Mixed hyperlipidemia, continues on Pravachol.  Would aim for LDL 70 or less.  Current medicines were reviewed with the patient today.  Disposition: Follow-up in 1 year.  Signed, Satira Sark, MD, [facility] 01/04/2019 11:57 AM    [place] [facility] [address] [place], [place] [address] Phone: [phone]; Fax: [phone]

## 2019-01-04 ENCOUNTER — Ambulatory Visit: Payer: Medicare HMO | Admitting: Cardiology

## 2019-01-04 ENCOUNTER — Encounter: Payer: Self-pay | Admitting: Cardiology

## 2019-01-04 VITALS — BP 148/92 | HR 97 | Ht 68.0 in | Wt 256.0 lb

## 2019-01-04 DIAGNOSIS — I25119 Atherosclerotic heart disease of native coronary artery with unspecified angina pectoris: Secondary | ICD-10-CM | POA: Diagnosis not present

## 2019-01-04 DIAGNOSIS — I350 Nonrheumatic aortic (valve) stenosis: Secondary | ICD-10-CM

## 2019-01-04 DIAGNOSIS — I10 Essential (primary) hypertension: Secondary | ICD-10-CM

## 2019-01-04 DIAGNOSIS — E782 Mixed hyperlipidemia: Secondary | ICD-10-CM | POA: Diagnosis not present

## 2019-01-04 NOTE — Patient Instructions (Signed)
Medication Instructions: STOP Imdur  START Aspirin 81 mg daily  Labwork: None today  Procedures/Testing: None today  Follow-Up: 1 year with Dr.McDowell  Any Additional Special Instructions Will Be Listed Below (If Applicable).     If you need a refill on your cardiac medications before your next appointment, please call your pharmacy.

## 2019-01-20 ENCOUNTER — Telehealth: Payer: Self-pay | Admitting: *Deleted

## 2019-01-20 ENCOUNTER — Other Ambulatory Visit: Payer: Self-pay | Admitting: "Endocrinology

## 2019-01-20 MED ORDER — DULAGLUTIDE 1.5 MG/0.5ML ~~LOC~~ SOAJ: SUBCUTANEOUS | 0 refills | Status: DC

## 2019-01-20 NOTE — Telephone Encounter (Signed)
Patient called stating the pharmacy told her she needed to call the office regarding her refill for trulicty. patient states she needs this refilled. Please advise [phone]

## 2019-01-23 NOTE — Telephone Encounter (Signed)
This was refilled on Friday

## 2019-01-25 ENCOUNTER — Telehealth: Payer: Self-pay

## 2019-01-25 DIAGNOSIS — E118 Type 2 diabetes mellitus with unspecified complications: Secondary | ICD-10-CM

## 2019-01-25 DIAGNOSIS — Z794 Long term (current) use of insulin: Principal | ICD-10-CM

## 2019-01-25 MED ORDER — DULAGLUTIDE 1.5 MG/0.5ML ~~LOC~~ SOAJ: SUBCUTANEOUS | 0 refills | Status: DC

## 2019-01-25 NOTE — Telephone Encounter (Signed)
[name], CMA  

## 2019-03-01 LAB — COMPLETE METABOLIC PANEL WITH GFR
AG Ratio: 1.6 (calc) (ref 1.0–2.5)
ALBUMIN MSPROF: 4 g/dL (ref 3.6–5.1)
ALKALINE PHOSPHATASE (APISO): 58 U/L (ref 37–153)
ALT: 10 U/L (ref 6–29)
AST: 14 U/L (ref 10–35)
BILIRUBIN TOTAL: 0.4 mg/dL (ref 0.2–1.2)
BUN: 20 mg/dL (ref 7–25)
CHLORIDE: 103 mmol/L (ref 98–110)
CO2: 30 mmol/L (ref 20–32)
Calcium: 9.2 mg/dL (ref 8.6–10.4)
Creat: 0.87 mg/dL (ref 0.50–0.99)
GFR, Est African American: 83 mL/min/{1.73_m2} (ref >=60)
GFR, Est Non African American: 72 mL/min/{1.73_m2} (ref >=60)
GLOBULIN: 2.5 g/dL (ref 1.9–3.7)
GLUCOSE: 113 mg/dL — AB (ref 65–99)
Potassium: 4 mmol/L (ref 3.5–5.3)
SODIUM: 141 mmol/L (ref 135–146)
Total Protein: 6.5 g/dL (ref 6.1–8.1)

## 2019-03-01 LAB — HEMOGLOBIN A1C
Hgb A1c MFr Bld: 5.9 % of total Hgb — ABNORMAL HIGH (ref <5.7)
MEAN PLASMA GLUCOSE: 123 (calc)
eAG (mmol/L): 6.8 (calc)

## 2019-03-01 LAB — T4, FREE: FREE T4: 1.1 ng/dL (ref 0.8–1.8)

## 2019-03-01 LAB — TSH: TSH: 0.68 m[IU]/L (ref 0.40–4.50)

## 2019-03-07 ENCOUNTER — Encounter: Payer: Self-pay | Admitting: "Endocrinology

## 2019-03-08 ENCOUNTER — Ambulatory Visit (INDEPENDENT_AMBULATORY_CARE_PROVIDER_SITE_OTHER): Payer: Medicare HMO | Admitting: "Endocrinology

## 2019-03-08 ENCOUNTER — Other Ambulatory Visit: Payer: Self-pay

## 2019-03-08 ENCOUNTER — Encounter: Payer: Self-pay | Admitting: "Endocrinology

## 2019-03-08 DIAGNOSIS — E118 Type 2 diabetes mellitus with unspecified complications: Secondary | ICD-10-CM | POA: Diagnosis not present

## 2019-03-08 DIAGNOSIS — Z794 Long term (current) use of insulin: Secondary | ICD-10-CM

## 2019-03-08 DIAGNOSIS — E782 Mixed hyperlipidemia: Secondary | ICD-10-CM

## 2019-03-08 DIAGNOSIS — E0590 Thyrotoxicosis, unspecified without thyrotoxic crisis or storm: Secondary | ICD-10-CM

## 2019-03-08 MED ORDER — DULAGLUTIDE 1.5 MG/0.5ML ~~LOC~~ SOAJ: SUBCUTANEOUS | 0 refills | Status: DC

## 2019-03-08 MED ORDER — METFORMIN HCL 1000 MG PO TABS: ORAL_TABLET | ORAL | 2 refills | Status: DC

## 2019-03-08 NOTE — Progress Notes (Signed)
Endocrinology WebEx  Visit Follow up Note -During COVID -19 Pandemic    Subjective:    Patient ID: [name], female    DOB: 1956-09-24. Patient is being seen in f/u for management of type 2 diabetes, hyperthyroidism, hyperlipidemia.  PMD:   Stephens Shire, MD  Past Medical History:  Diagnosis Date  . Anxiety   . Coronary atherosclerosis    Cardiac CT 09/2018 with calcium score 8.7 and mild proximal LAD disease  . Essential hypertension 2009  . GERD (gastroesophageal reflux disease)   . Iron deficiency anemia   . Osteoarthritis   . Palpitations    Tachypalpitations on beta blockers since 2010  . Type 2 diabetes mellitus ([place]) 2003  . Vitamin D deficiency    Past Surgical History:  Procedure Laterality Date  . ABDOMINAL HYSTERECTOMY     History of cervical cancer  . TONSILLECTOMY     Social History   Socioeconomic History  . Marital status: Single    Spouse name: Not on file  . Number of children: 2  . Years of education: Not on file  . Highest education level: Not on file  Occupational History  . Occupation: DISABLED    Employer: UNEMPLOYED    Comment: OSTEOARTHRITIS  Social Needs  . Financial resource strain: Not on file  . Food insecurity:    Worry: Not on file    Inability: Not on file  . Transportation needs:    Medical: Not on file    Non-medical: Not on file  Tobacco Use  . Smoking status: Never Smoker  . Smokeless tobacco: Never Used  Substance and Sexual Activity  . Alcohol use: No  . Drug use: No  . Sexual activity: Never  Lifestyle  . Physical activity:    Days per week: Not on file    Minutes per session: Not on file  . Stress: Not on file  Relationships  . Social connections:    Talks on phone: Not on file    Gets together: Not on file    Attends religious service: Not on file    Active member of club or organization: Not on file    Attends meetings of clubs or organizations:  Not on file    Relationship status: Not on file  Other Topics Concern  . Not on file  Social History Narrative   Has 2 grandchildren. 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 aspirin EC 81 MG tablet Take 81 mg by mouth daily.  . blood glucose meter kit and supplies KIT Dispense based on patient and insurance preference. Use up to four times daily as directed. (FOR ICD-10 E11.65)  . Cholecalciferol (VITAMIN D3) 5000 units TABS Take 1 tablet by mouth daily.   . Cobalamin Combinations (B-12) [phone] MCG SUBL Place under the tongue.  . Dulaglutide (TRULICITY) 1.5 BS/4.9QP SOPN INJECT  1.5MG  INTO  THE  SKIN EVERY WEEK  . DULoxetine (CYMBALTA) 30 MG capsule Take 30 mg by mouth daily.  Marland Kitchen FERROUSUL 325 (65 Fe) MG tablet Take 1 tablet by mouth daily.  Marland Kitchen gabapentin (NEURONTIN) 400  MG capsule Take 400 mg by mouth 3 (three) times daily.  Marland Kitchen losartan (COZAAR) 100 MG tablet Take 100 mg by mouth daily.  . magnesium oxide (MAG-OX) 400 MG tablet Take 400 mg by mouth 4 (four) times daily.   . meloxicam (MOBIC) 15 MG tablet Take 15 mg by mouth at bedtime.  . metFORMIN (GLUCOPHAGE) 1000 MG tablet TAKE (1) TABLET BY MOUTH TWICE DAILY.  . pantoprazole (PROTONIX) 40 MG tablet Take 40 mg by mouth daily.    . pravastatin (PRAVACHOL) 20 MG tablet Take 20 mg by mouth daily.  . [DISCONTINUED] Dulaglutide (TRULICITY) 1.5 HY/0.7PX SOPN INJECT  1.5MG  INTO  THE  SKIN EVERY WEEK  . [DISCONTINUED] Insulin Glargine (LANTUS SOLOSTAR [place]) Inject 15 Units into the skin at bedtime.  . [DISCONTINUED] Insulin Glargine (LANTUS SOLOSTAR) 100 UNIT/ML Solostar Pen Inject 15 Units into the skin at bedtime.  . [DISCONTINUED] metFORMIN (GLUCOPHAGE) 1000 MG tablet TAKE (1) TABLET BY MOUTH TWICE DAILY.   No facility-administered encounter medications on file as of 03/08/2019.    ALLERGIES: Allergies  Allergen Reactions  . Contrast Media  [Iodinated Diagnostic Agents]   . Sulfa Antibiotics Nausea And Vomiting    Other reaction(s): GI Upset (intolerance) unknown  . Doxepin Rash and Swelling   VACCINATION STATUS: Immunization History  Administered Date(s) Administered  . Tdap 11/24/2007    Diabetes  She presents for her follow-up diabetic visit. She has type 2 diabetes mellitus. Onset time: She was diagnosed at approximate age of 63 years. Her disease course has been improving. There are no hypoglycemic associated symptoms. Pertinent negatives for hypoglycemia include no confusion, pallor or seizures. Pertinent negatives for diabetes include no fatigue, no polydipsia, no polyphagia and no polyuria. There are no hypoglycemic complications. Symptoms are improving. Diabetic complications include peripheral neuropathy. Risk factors for coronary artery disease include diabetes mellitus, dyslipidemia, hypertension, obesity, family history and sedentary lifestyle. Current diabetic treatment includes intensive insulin program and oral agent (monotherapy). Her weight is decreasing steadily (She has achieved 50 pounds of weight loss since August 2018.). She is following a generally unhealthy diet. When asked about meal planning, she reported none. She has not had a previous visit with a dietitian. She never participates in exercise. Her breakfast blood glucose range is generally 110-130 mg/dl. Her overall blood glucose range is 110-130 mg/dl. (She returns with continued improvement in her glycemic profile with A1c of 5.9%.   ) An ACE inhibitor/angiotensin II receptor blocker is being taken. Eye exam is current.  Hyperlipidemia  This is a chronic problem. The current episode started more than 1 year ago. The problem is controlled. Exacerbating diseases include diabetes and obesity. Pertinent negatives include no myalgias. Current antihyperlipidemic treatment includes statins. Risk factors for coronary artery disease include dyslipidemia, diabetes  mellitus, hypertension, obesity, a sedentary lifestyle, family history and post-menopausal.      Objective:    There were no vitals taken for this visit.  Wt Readings from Last 3 Encounters:  01/04/19 256 lb (116.1 kg)  11/02/18 253 lb (114.8 kg)  09/11/18 249 lb 12.5 oz (113.3 kg)     Recent Results (from the past 2160 hour(s))  Hemoglobin A1c     Status: Abnormal   Collection Time: 02/28/19  7:30 AM  Result Value Ref Range   Hgb A1c MFr Bld 5.9 (H) <5.7 % of total Hgb    Comment: For someone without known diabetes, a hemoglobin  A1c value between 5.7% and 6.4% is consistent with prediabetes and should  be confirmed with a  follow-up test. . For someone with known diabetes, a value <7% indicates that their diabetes is well controlled. A1c targets should be individualized based on duration of diabetes, age, comorbid conditions, and other considerations. . This assay result is consistent with an increased risk of diabetes. . Currently, no consensus exists regarding use of hemoglobin A1c for diagnosis of diabetes for children. .    Mean Plasma Glucose 123 (calc)   eAG (mmol/L) 6.8 (calc)  COMPLETE METABOLIC PANEL WITH GFR     Status: Abnormal   Collection Time: 02/28/19  7:30 AM  Result Value Ref Range   Glucose, Bld 113 (H) 65 - 99 mg/dL    Comment: .            Fasting reference interval . For someone without known diabetes, a glucose value between 100 and 125 mg/dL is consistent with prediabetes and should be confirmed with a follow-up test. .    BUN 20 7 - 25 mg/dL   Creat 0.87 0.50 - 0.99 mg/dL    Comment: For patients >46 years of age, the reference limit for Creatinine is approximately 13% higher for people identified as African-American. .    GFR, Est Non African American 72 > OR = 60 mL/min/1.75m   GFR, Est African American 83 > OR = 60 mL/min/1.743m  BUN/Creatinine Ratio NOT APPLICABLE 6 - 22 (calc)   Sodium 141 135 - 146 mmol/L   Potassium 4.0  3.5 - 5.3 mmol/L   Chloride 103 98 - 110 mmol/L   CO2 30 20 - 32 mmol/L   Calcium 9.2 8.6 - 10.4 mg/dL   Total Protein 6.5 6.1 - 8.1 g/dL   Albumin 4.0 3.6 - 5.1 g/dL   Globulin 2.5 1.9 - 3.7 g/dL (calc)   AG Ratio 1.6 1.0 - 2.5 (calc)   Total Bilirubin 0.4 0.2 - 1.2 mg/dL   Alkaline phosphatase (APISO) 58 37 - 153 U/L   AST 14 10 - 35 U/L   ALT 10 6 - 29 U/L  TSH     Status: None   Collection Time: 02/28/19  7:30 AM  Result Value Ref Range   TSH 0.68 0.40 - 4.50 mIU/L  T4, free     Status: None   Collection Time: 02/28/19  7:30 AM  Result Value Ref Range   Free T4 1.1 0.8 - 1.8 ng/dL    Lipid Panel     Component Value Date/Time   CHOL 134 02/24/2017   TRIG 87 02/24/2017   HDL 49 02/24/2017   LDLCALC 75 02/24/2017     Assessment & Plan:   1. Type 2 diabetes mellitus with complication, with long-term current use of insulin (HCOcean- Patient has currently controlled ype 2 DM since  [age]ears of age. -Her previsit labs show stable A1c of 5.9%, with minimal dose of Lantus at 15 units.     Recent labs reviewed.   Her diabetes is complicated by obesity/sedentary life, peripheral neuropathy and patient remains at a high risk for more acute and chronic complications which include CAD, CVA, CKD, retinopathy, and neuropathy. These are all discussed in detail with the patient.  - I have counseled the patient on diet management and weight loss, by adopting a carbohydrate restricted/protein rich diet.  - Patient admits there is a room for improvement in her diet and drink choices. -  Suggestion is made for her to avoid simple carbohydrates  from her diet including Cakes, Sweet Desserts /  Pastries, Ice Cream, Soda (diet and regular), Sweet Tea, Candies, Chips, Cookies, Store Bought Juices, Alcohol in Excess of  1-2 drinks a day, Artificial Sweeteners, and "Sugar-free" Products. This will help patient to have stable blood glucose profile and potentially avoid unintended weight gain.   -  I encouraged the patient to switch to  unprocessed or minimally processed complex starch and increased protein intake (animal or plant source), fruits, and vegetables.  - Patient is advised to stick to a routine mealtimes to eat 3 meals  a day and avoid unnecessary snacks ( to snack only to correct hypoglycemia).    - I have approached patient with the following individualized plan to manage diabetes and patient agrees:   -At this time she will be tried off of insulin.  I have advised her to discontinue Lantus, continue metformin 1000 mg p.o. twice daily and Trulicity 1.5 mg subcutaneously weekly.    - Patient specific target  A1c;  LDL, HDL, Triglycerides, and  Waist Circumference were discussed in detail.   2) Lipids/HPL: Recent lipid panel showed controlled LDL at 75.  She is advised to continue pravastatin 20 mg p.o. Nightly.    5) history of hyperthyroidism:  -She is status post ablative therapy with I-131 for hyperthyroidism on May 16, 2018.  Her previsit labs are consistent with treatment effect, however not hypothyroid range yet. She will not be initiated on thyroid hormone replacement for now.     - I advised patient to maintain close follow up with Stephens Shire, MD for primary care needs.  - Patient Care Time Today:  25 min, of which >50% was spent in reviewing her  current and  previous labs/studies, previous treatments, and medications doses and developing a plan for long-term care based on the latest recommendations for standards of care.  Lauran E Mentzel participated in the discussions, expressed understanding, and voiced agreement with the above plans.  All questions were answered to her satisfaction. she is encouraged to contact clinic should she have any questions or concerns prior to her return visit.   Follow up plan: - Return for Follow up with Pre-visit Labs.  Glade Lloyd, MD Phone: [phone]  Fax: [phone]  -  This note was partially dictated with voice  recognition software. Similar sounding words can be transcribed inadequately or may not  be corrected upon review.  03/08/2019, 11:43 AM

## 2019-03-20 IMAGING — DX DG ABDOMEN ACUTE W/ 1V CHEST
3 series · 3 of 3 positions shown · non-contrast
Comparison: PA and lateral chest 03/18/2013.

CLINICAL DATA: Fever, body aches and diarrhea today.

EXAM:
DG ABDOMEN ACUTE W/ 1V CHEST

[chest pa]
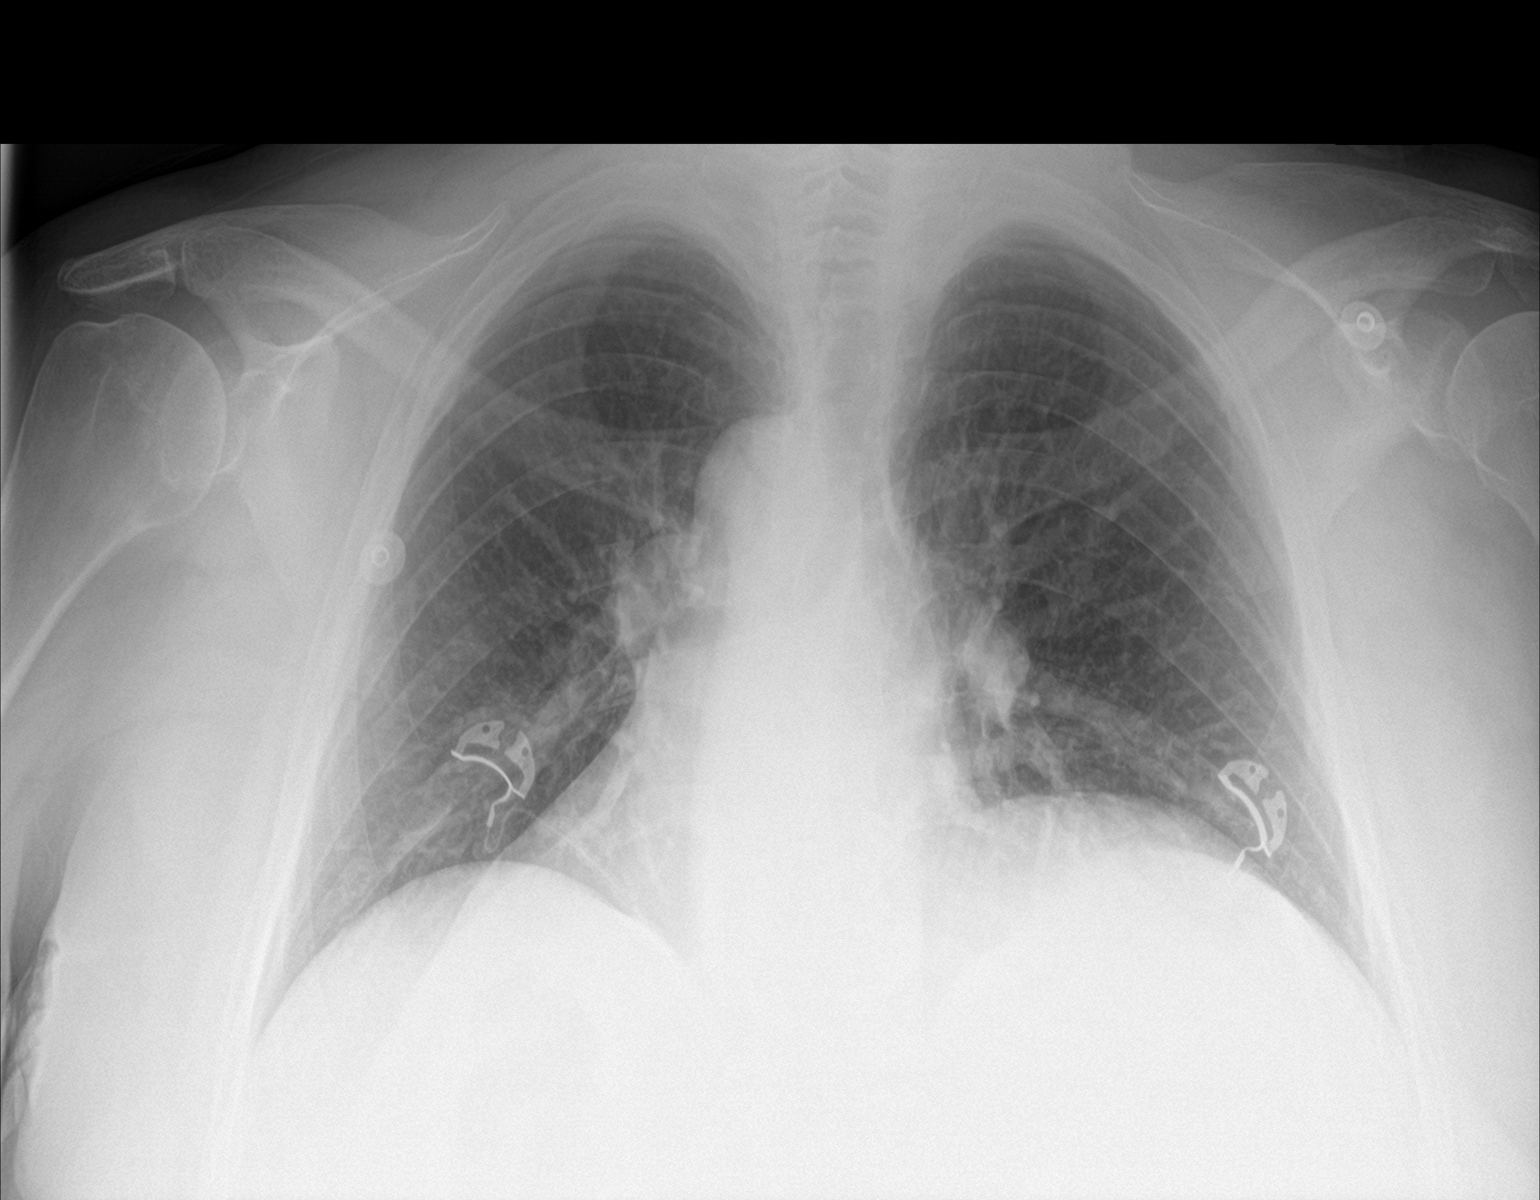

[abdomen supine (1 of 2)]
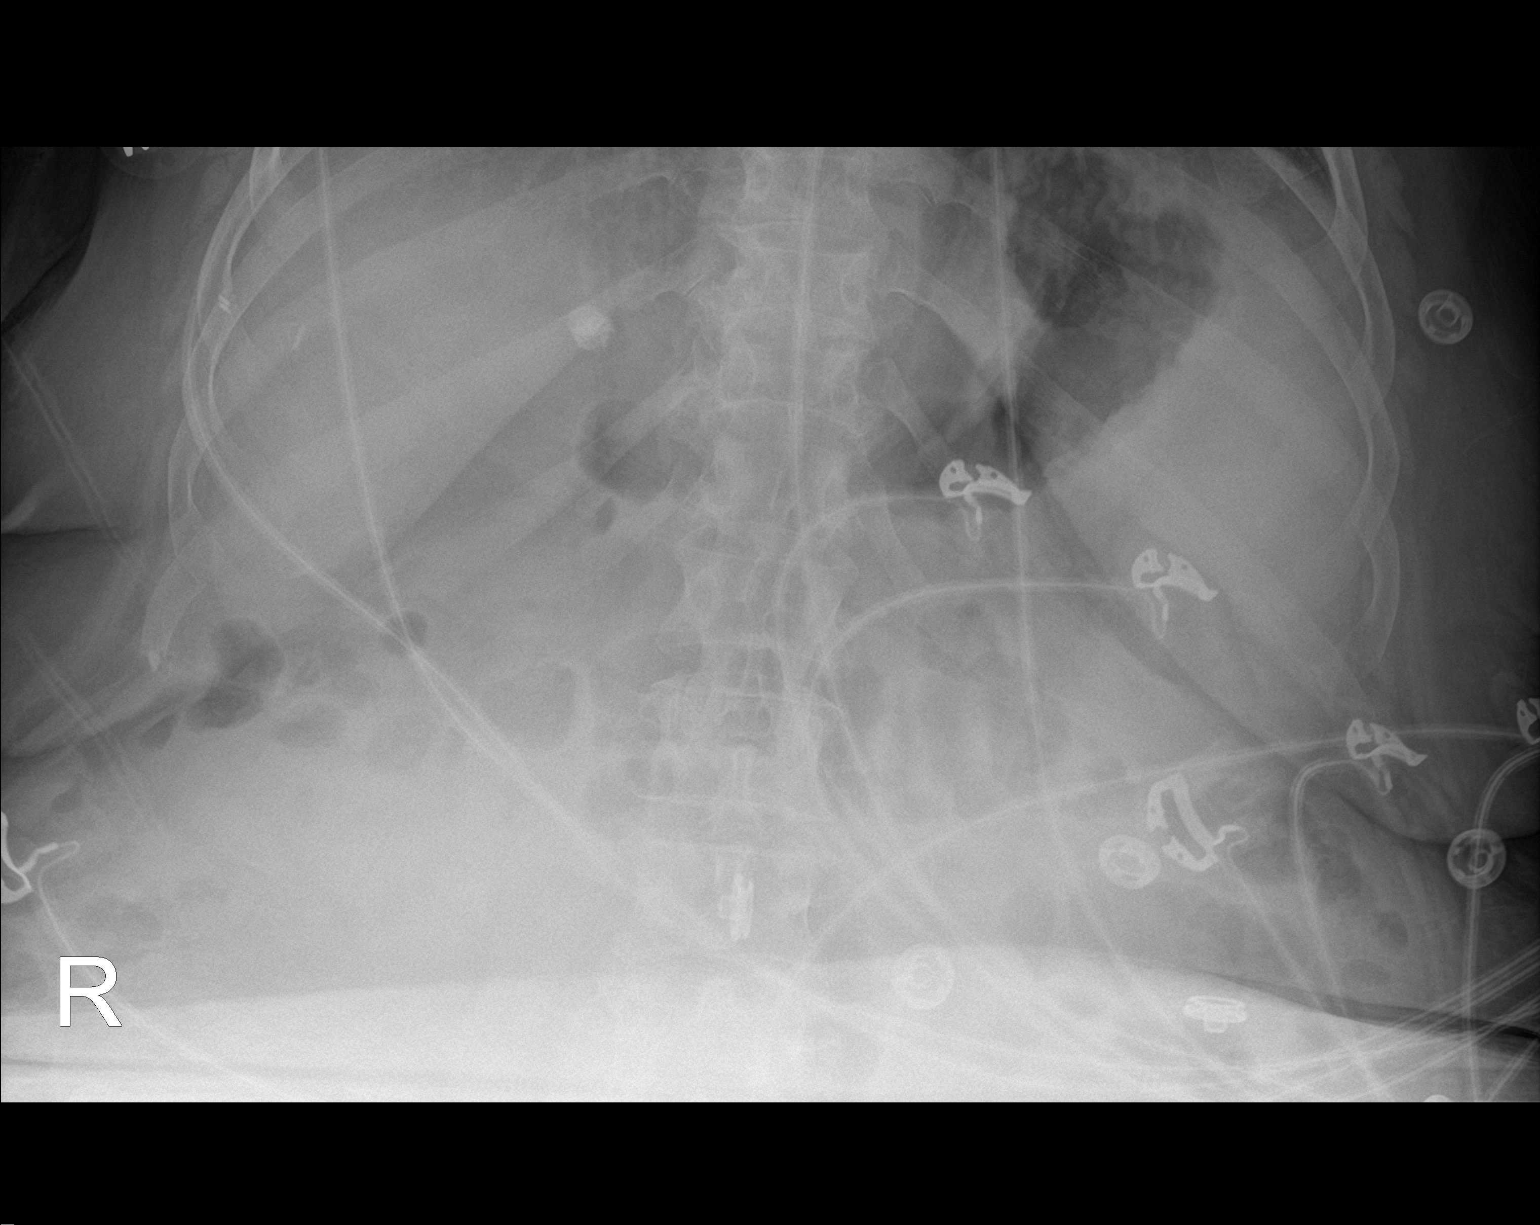

[abdomen supine (2 of 2)]
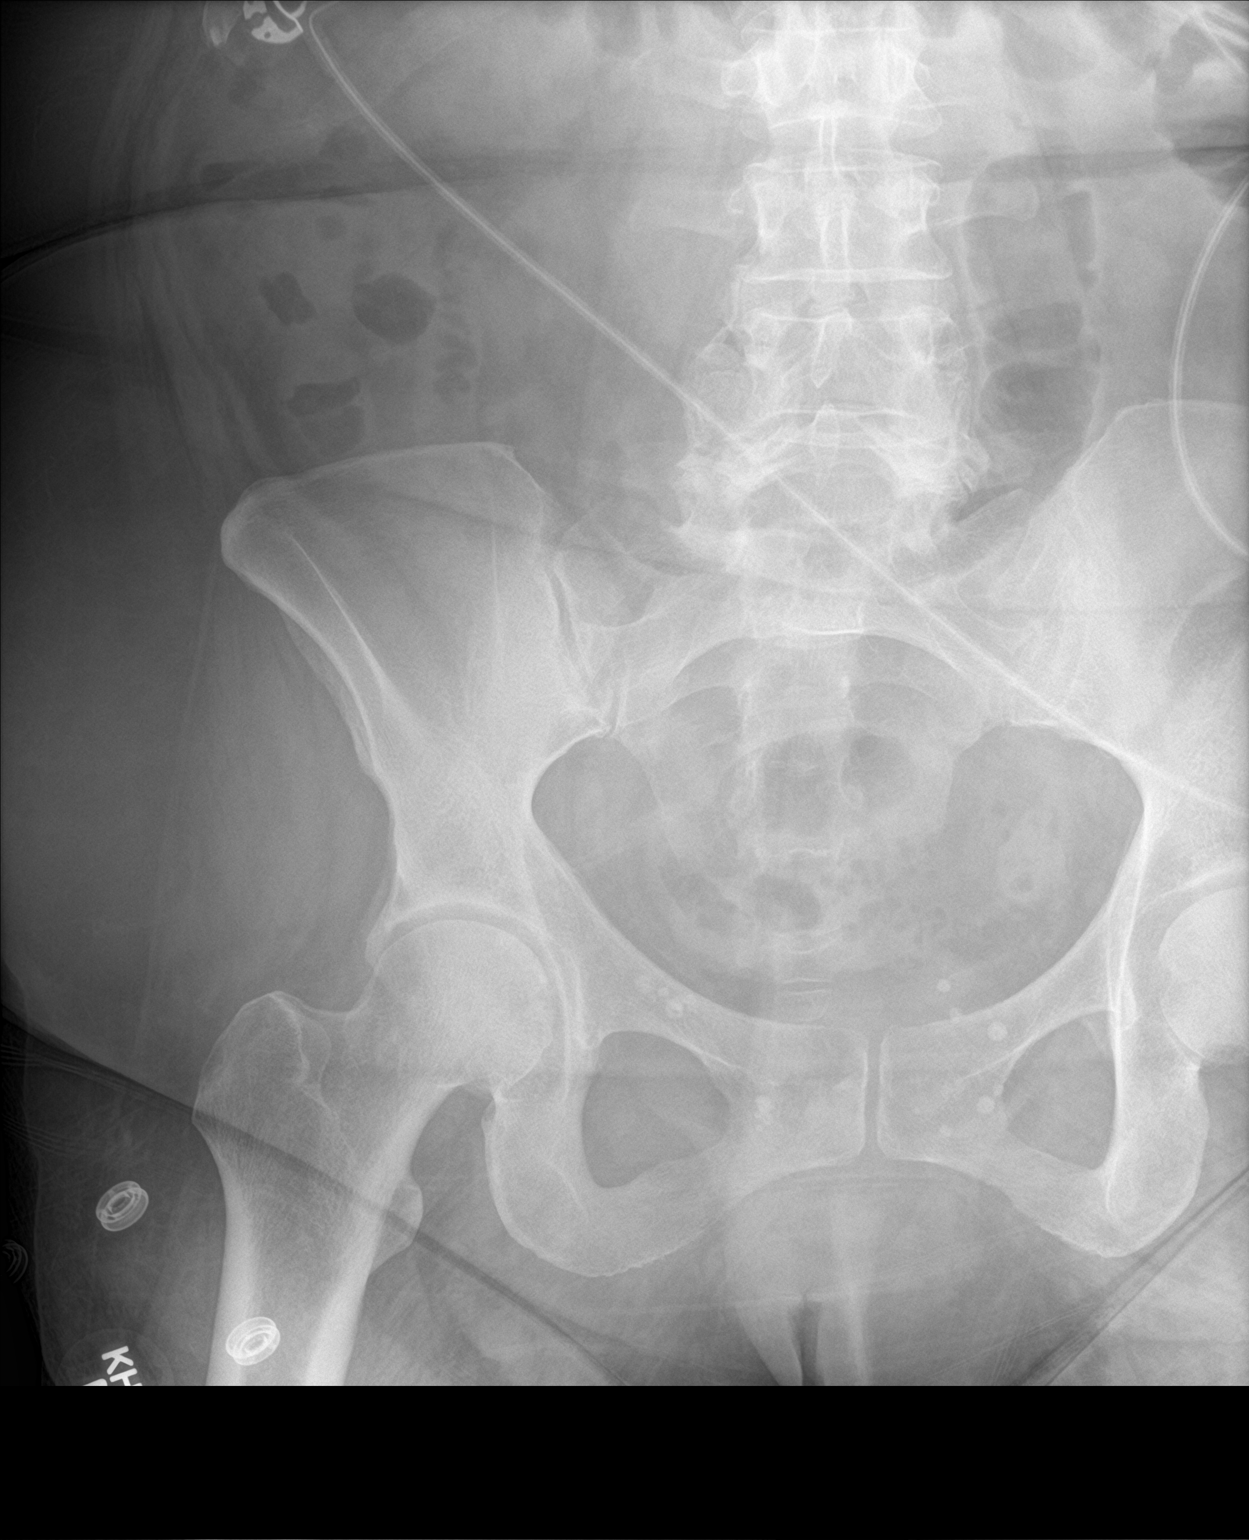

[3 of 3 positions shown; findings below may reference images not displayed]

FINDINGS: Single-view of the chest demonstrates clear lungs and normal heart
size. No pneumothorax or pleural fluid.

Two views of the abdomen show no free intraperitoneal air. The bowel
gas pattern is nonobstructive. No abnormal abdominal calcification
or focal bony abnormality.
IMPRESSION: No acute finding chest or abdomen.

## 2019-04-06 ENCOUNTER — Other Ambulatory Visit: Payer: Self-pay | Admitting: "Endocrinology

## 2019-04-06 DIAGNOSIS — E118 Type 2 diabetes mellitus with unspecified complications: Secondary | ICD-10-CM

## 2019-06-07 LAB — COMPLETE METABOLIC PANEL WITH GFR
AG Ratio: 1.5 (calc) (ref 1.0–2.5)
ALT: 16 U/L (ref 6–29)
AST: 17 U/L (ref 10–35)
Albumin: 3.9 g/dL (ref 3.6–5.1)
Alkaline phosphatase (APISO): 57 U/L (ref 37–153)
BUN: 18 mg/dL (ref 7–25)
CO2: 30 mmol/L (ref 20–32)
Calcium: 9.6 mg/dL (ref 8.6–10.4)
Chloride: 102 mmol/L (ref 98–110)
Creat: 0.92 mg/dL (ref 0.50–0.99)
GFR, Est African American: 78 mL/min/{1.73_m2} (ref >=60)
GFR, Est Non African American: 67 mL/min/{1.73_m2} (ref >=60)
Globulin: 2.6 g/dL (calc) (ref 1.9–3.7)
Glucose, Bld: 127 mg/dL — ABNORMAL HIGH (ref 65–99)
Potassium: 4.8 mmol/L (ref 3.5–5.3)
Sodium: 137 mmol/L (ref 135–146)
Total Bilirubin: 0.4 mg/dL (ref 0.2–1.2)
Total Protein: 6.5 g/dL (ref 6.1–8.1)

## 2019-06-07 LAB — HEMOGLOBIN A1C
Hgb A1c MFr Bld: 6.1 % of total Hgb — ABNORMAL HIGH (ref <5.7)
Mean Plasma Glucose: 128 (calc)
eAG (mmol/L): 7.1 (calc)

## 2019-06-07 LAB — T4, FREE: Free T4: 1 ng/dL (ref 0.8–1.8)

## 2019-06-07 LAB — TSH: TSH: 1.48 mIU/L (ref 0.40–4.50)

## 2019-06-13 ENCOUNTER — Encounter: Payer: Self-pay | Admitting: "Endocrinology

## 2019-06-13 ENCOUNTER — Other Ambulatory Visit: Payer: Self-pay

## 2019-06-13 ENCOUNTER — Ambulatory Visit (INDEPENDENT_AMBULATORY_CARE_PROVIDER_SITE_OTHER): Payer: Medicare HMO | Admitting: "Endocrinology

## 2019-06-13 DIAGNOSIS — E118 Type 2 diabetes mellitus with unspecified complications: Secondary | ICD-10-CM | POA: Diagnosis not present

## 2019-06-13 DIAGNOSIS — Z794 Long term (current) use of insulin: Secondary | ICD-10-CM | POA: Diagnosis not present

## 2019-06-13 DIAGNOSIS — E782 Mixed hyperlipidemia: Secondary | ICD-10-CM

## 2019-06-13 DIAGNOSIS — E0590 Thyrotoxicosis, unspecified without thyrotoxic crisis or storm: Secondary | ICD-10-CM | POA: Diagnosis not present

## 2019-06-13 NOTE — Progress Notes (Signed)
06/13/2019                                                    Endocrinology Telehealth Visit Follow up Note -During COVID -19 Pandemic  This visit type was conducted due to national recommendations for restrictions regarding the COVID-19 Pandemic  in an effort to limit this patient's exposure and mitigate transmission of the corona virus.  Due to her co-morbid illnesses, [name] is at  moderate to high risk for complications without adequate follow up.  This format is felt to be most appropriate for her at this time.  I connected with this patient on 06/13/2019   by telephone and verified that I am speaking with the correct person using two identifiers. [place], Jul 06, 1956. she has verbally consented to this visit. All issues noted in this document were discussed and addressed. The format was not optimal for physical exam.    Subjective:    Patient ID: [name], female    DOB: [birth year]. Patient is being engaged in telehealth via telephone  For management of type 2 diabetes, hyperthyroidism, hyperlipidemia.  PMD:   Stephens Shire, MD  Past Medical History:  Diagnosis Date  . Anxiety   . Coronary atherosclerosis    Cardiac CT 09/2018 with calcium score 8.7 and mild proximal LAD disease  . Essential hypertension 2009  . GERD (gastroesophageal reflux disease)   . Iron deficiency anemia   . Osteoarthritis   . Palpitations    Tachypalpitations on beta blockers since 2010  . Type 2 diabetes mellitus ([place]) 2003  . Vitamin D deficiency    Past Surgical History:  Procedure Laterality Date  . ABDOMINAL HYSTERECTOMY     History of cervical cancer  . TONSILLECTOMY     Social History   Socioeconomic History  . Marital status: Single    Spouse name: Not on file  . Number of children: 2  . Years of education: Not on file  . Highest education level: Not on file  Occupational History  . Occupation: DISABLED    Employer: UNEMPLOYED    Comment: OSTEOARTHRITIS  Social  Needs  . Financial resource strain: Not on file  . Food insecurity    Worry: Not on file    Inability: Not on file  . Transportation needs    Medical: Not on file    Non-medical: Not on file  Tobacco Use  . Smoking status: Never Smoker  . Smokeless tobacco: Never Used  Substance and Sexual Activity  . Alcohol use: No  . Drug use: No  . Sexual activity: Never  Lifestyle  . Physical activity    Days per week: Not on file    Minutes per session: Not on file  . Stress: Not on file  Relationships  . Social Herbalist on phone: Not on file    Gets together: Not on file    Attends religious service: Not on file    Active member of club or organization: Not on file    Attends meetings of clubs or organizations: Not on file    Relationship status: Not on file  Other Topics Concern  . Not on file  Social History Narrative   Has 2 grandchildren. 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 aspirin EC 81 MG tablet Take 81 mg by mouth daily.  . blood glucose meter kit and supplies KIT Dispense based on patient and insurance preference. Use up to four times daily as directed. (FOR ICD-10 E11.65)  . Cholecalciferol (VITAMIN D3) 5000 units TABS Take 1 tablet by mouth daily.   . Cobalamin Combinations (B-12) [phone] MCG SUBL Place under the tongue.  . DULoxetine (CYMBALTA) 30 MG capsule Take 30 mg by mouth daily.  Marland Kitchen FERROUSUL 325 (65 Fe) MG tablet Take 1 tablet by mouth daily.  Marland Kitchen gabapentin (NEURONTIN) 400 MG capsule Take 400 mg by mouth 3 (three) times daily.  Marland Kitchen losartan (COZAAR) 100 MG tablet Take 100 mg by mouth daily.  . magnesium oxide (MAG-OX) 400 MG tablet Take 400 mg by mouth 4 (four) times daily.   . meloxicam (MOBIC) 15 MG tablet Take 15 mg by mouth at bedtime.  . metFORMIN (GLUCOPHAGE) 1000 MG tablet TAKE 1 TABLET TWICE DAILY  . pantoprazole (PROTONIX) 40 MG tablet Take 40 mg by  mouth daily.    . pravastatin (PRAVACHOL) 20 MG tablet Take 20 mg by mouth daily.  . TRULICITY 1.5 XE/9.4MH SOPN INJECT  1.5MG  INTO  THE  SKIN EVERY WEEK   No facility-administered encounter medications on file as of 06/13/2019.    ALLERGIES: Allergies  Allergen Reactions  . Contrast Media [Iodinated Diagnostic Agents]   . Sulfa Antibiotics Nausea And Vomiting    Other reaction(s): GI Upset (intolerance) unknown  . Doxepin Rash and Swelling   VACCINATION STATUS: Immunization History  Administered Date(s) Administered  . Tdap 11/24/2007    Diabetes She presents for her follow-up diabetic visit. She has type 2 diabetes mellitus. Onset time: She was diagnosed at approximate age of 14 years. Her disease course has been improving. There are no hypoglycemic associated symptoms. Pertinent negatives for hypoglycemia include no confusion, pallor or seizures. Pertinent negatives for diabetes include no fatigue, no polydipsia, no polyphagia and no polyuria. There are no hypoglycemic complications. Symptoms are improving. Diabetic complications include peripheral neuropathy. Risk factors for coronary artery disease include diabetes mellitus, dyslipidemia, hypertension, obesity, family history and sedentary lifestyle. Current diabetic treatment includes intensive insulin program and oral agent (monotherapy). Weight trend: She has achieved 50 pounds of weight loss since August 2018. She is following a generally unhealthy diet. When asked about meal planning, she reported none. She has not had a previous visit with a dietitian. She never participates in exercise. (She returns with continued improvement in her glycemic profile with A1c of 5.9%.   ) An ACE inhibitor/angiotensin II receptor blocker is being taken. Eye exam is current.  Hyperlipidemia This is a chronic problem. The current episode started more than 1 year ago. The problem is controlled. Exacerbating diseases include diabetes and obesity.  Pertinent negatives include no myalgias. Current antihyperlipidemic treatment includes statins. Risk factors for coronary artery disease include dyslipidemia, diabetes mellitus, hypertension, obesity, a sedentary lifestyle, family history and post-menopausal.     Objective:    There were no vitals taken for this visit.  Wt Readings from Last 3 Encounters:  01/04/19 256 lb (116.1 kg)  11/02/18 253 lb (114.8 kg)  09/11/18 249 lb 12.5 oz (113.3 kg)     Recent Results (from the past 2160 hour(s))  Hemoglobin A1c     Status: Abnormal   Collection Time: 06/06/19  8:50 AM  Result Value Ref Range   Hgb A1c MFr Bld  6.1 (H) <5.7 % of total Hgb    Comment: For someone without known diabetes, a hemoglobin  A1c value between 5.7% and 6.4% is consistent with prediabetes and should be confirmed with a  follow-up test. . For someone with known diabetes, a value <7% indicates that their diabetes is well controlled. A1c targets should be individualized based on duration of diabetes, age, comorbid conditions, and other considerations. . This assay result is consistent with an increased risk of diabetes. . Currently, no consensus exists regarding use of hemoglobin A1c for diagnosis of diabetes for children. .    Mean Plasma Glucose 128 (calc)   eAG (mmol/L) 7.1 (calc)  COMPLETE METABOLIC PANEL WITH GFR     Status: Abnormal   Collection Time: 06/06/19  8:50 AM  Result Value Ref Range   Glucose, Bld 127 (H) 65 - 99 mg/dL    Comment: .            Fasting reference interval . For someone without known diabetes, a glucose value >125 mg/dL indicates that they may have diabetes and this should be confirmed with a follow-up test. .    BUN 18 7 - 25 mg/dL   Creat 0.92 0.50 - 0.99 mg/dL    Comment: For patients >73 years of age, the reference limit for Creatinine is approximately 13% higher for people identified as African-American. .    GFR, Est Non African American 67 > OR = 60  mL/min/1.11m   GFR, Est African American 78 > OR = 60 mL/min/1.726m  BUN/Creatinine Ratio NOT APPLICABLE 6 - 22 (calc)   Sodium 137 135 - 146 mmol/L   Potassium 4.8 3.5 - 5.3 mmol/L   Chloride 102 98 - 110 mmol/L   CO2 30 20 - 32 mmol/L   Calcium 9.6 8.6 - 10.4 mg/dL   Total Protein 6.5 6.1 - 8.1 g/dL   Albumin 3.9 3.6 - 5.1 g/dL   Globulin 2.6 1.9 - 3.7 g/dL (calc)   AG Ratio 1.5 1.0 - 2.5 (calc)   Total Bilirubin 0.4 0.2 - 1.2 mg/dL   Alkaline phosphatase (APISO) 57 37 - 153 U/L   AST 17 10 - 35 U/L   ALT 16 6 - 29 U/L  TSH     Status: None   Collection Time: 06/06/19  8:50 AM  Result Value Ref Range   TSH 1.48 0.40 - 4.50 mIU/L  T4, free     Status: None   Collection Time: 06/06/19  8:50 AM  Result Value Ref Range   Free T4 1.0 0.8 - 1.8 ng/dL    Lipid Panel     Component Value Date/Time   CHOL 134 02/24/2017   TRIG 87 02/24/2017   HDL 49 02/24/2017   LDLCALC 75 02/24/2017     Assessment & Plan:   1. Type 2 diabetes mellitus with complication, with long-term current use of insulin ([place]- Patient has currently controlled type 2 DM since  [age]ears of age. -Her previsit labs show stable A1c of 6.1%, off of insulin is her last visit.  Recent labs reviewed.   Her diabetes is complicated by obesity/sedentary life, peripheral neuropathy and patient remains at a high risk for more acute and chronic complications which include CAD, CVA, CKD, retinopathy, and neuropathy. These are all discussed in detail with the patient.  - I have counseled the patient on diet management and weight loss, by adopting a carbohydrate restricted/protein rich diet.  - she  admits there is a  room for improvement in her diet and drink choices. -  Suggestion is made for her to avoid simple carbohydrates  from her diet including Cakes, Sweet Desserts / Pastries, Ice Cream, Soda (diet and regular), Sweet Tea, Candies, Chips, Cookies, Sweet Pastries,  Store Bought Juices, Alcohol in Excess of  1-2  drinks a day, Artificial Sweeteners, Coffee Creamer, and "Sugar-free" Products. This will help patient to have stable blood glucose profile and potentially avoid unintended weight gain.  - I encouraged the patient to switch to  unprocessed or minimally processed complex starch and increased protein intake (animal or plant source), fruits, and vegetables.  - Patient is advised to stick to a routine mealtimes to eat 3 meals  a day and avoid unnecessary snacks ( to snack only to correct hypoglycemia).    - I have approached patient with the following individualized plan to manage diabetes and patient agrees:   -She is advised to continue off of insulin, however she will continue to require metformin 1000 mg p.o. twice daily, and Trulicity 1.5 mg subcutaneously weekly.   - Patient specific target  A1c;  LDL, HDL, Triglycerides, and  Waist Circumference were discussed in detail.   2) Lipids/HPL: Recent lipid panel showed controlled LDL at 75.  She is advised to continue pravastatin 20 mg p.o. nightly.  Side effects and precautions discussed with her.    3) history of hyperthyroidism:  -She is status post ablative therapy with I-131 for hyperthyroidism on May 16, 2018.  Her previsit labs are consistent with treatment effect, and maintains her euthyroid status.  She will not require initiation of thyroid hormone replacement for now.    - I advised patient to maintain close follow up with Stephens Shire, MD for primary care needs.  - Patient Care Time Today:  25 min, of which >50% was spent in reviewing her  current and  previous labs/studies, previous treatments, and medications doses and developing a plan for long-term care based on the latest recommendations for standards of care.  Ofelia E Colla participated in the discussions, expressed understanding, and voiced agreement with the above plans.  All questions were answered to her satisfaction. she is encouraged to contact clinic should she have  any questions or concerns prior to her return visit.   Follow up plan: - Return in about 6 months (around 12/14/2019) for Follow up with Pre-visit Labs.  Glade Lloyd, MD Phone: [phone]  Fax: [phone]  -  This note was partially dictated with voice recognition software. Similar sounding words can be transcribed inadequately or may not  be corrected upon review.  06/13/2019, 5:09 PM

## 2019-06-14 ENCOUNTER — Ambulatory Visit: Payer: Medicare HMO | Admitting: "Endocrinology

## 2019-07-07 ENCOUNTER — Other Ambulatory Visit: Payer: Self-pay | Admitting: "Endocrinology

## 2019-08-23 ENCOUNTER — Other Ambulatory Visit: Payer: Self-pay | Admitting: "Endocrinology

## 2019-08-23 DIAGNOSIS — E118 Type 2 diabetes mellitus with unspecified complications: Secondary | ICD-10-CM

## 2019-09-11 ENCOUNTER — Ambulatory Visit: Payer: Medicare HMO | Admitting: "Endocrinology

## 2019-09-18 ENCOUNTER — Other Ambulatory Visit: Payer: Self-pay | Admitting: "Endocrinology

## 2019-11-02 ENCOUNTER — Other Ambulatory Visit: Payer: Self-pay | Admitting: "Endocrinology

## 2019-11-02 DIAGNOSIS — E118 Type 2 diabetes mellitus with unspecified complications: Secondary | ICD-10-CM

## 2019-11-25 ENCOUNTER — Other Ambulatory Visit: Payer: Self-pay | Admitting: "Endocrinology

## 2019-12-15 ENCOUNTER — Ambulatory Visit: Payer: Medicare HMO | Admitting: "Endocrinology

## 2019-12-15 LAB — COMPLETE METABOLIC PANEL WITH GFR
AG Ratio: 1.6 (calc) (ref 1.0–2.5)
ALT: 16 U/L (ref 6–29)
AST: 20 U/L (ref 10–35)
Albumin: 3.9 g/dL (ref 3.6–5.1)
Alkaline phosphatase (APISO): 53 U/L (ref 37–153)
BUN: 17 mg/dL (ref 7–25)
CO2: 30 mmol/L (ref 20–32)
Calcium: 9.2 mg/dL (ref 8.6–10.4)
Chloride: 103 mmol/L (ref 98–110)
Creat: 0.81 mg/dL (ref 0.50–0.99)
GFR, Est African American: 90 mL/min/{1.73_m2} (ref >=60)
GFR, Est Non African American: 78 mL/min/{1.73_m2} (ref >=60)
Globulin: 2.4 g/dL (calc) (ref 1.9–3.7)
Glucose, Bld: 150 mg/dL — ABNORMAL HIGH (ref 65–99)
Potassium: 4.2 mmol/L (ref 3.5–5.3)
Sodium: 139 mmol/L (ref 135–146)
Total Bilirubin: 0.4 mg/dL (ref 0.2–1.2)
Total Protein: 6.3 g/dL (ref 6.1–8.1)

## 2019-12-15 LAB — T4, FREE: Free T4: 1 ng/dL (ref 0.8–1.8)

## 2019-12-15 LAB — TSH: TSH: 4.2 mIU/L (ref 0.40–4.50)

## 2019-12-15 LAB — HEMOGLOBIN A1C
Hgb A1c MFr Bld: 6.9 % of total Hgb — ABNORMAL HIGH (ref <5.7)
Mean Plasma Glucose: 151 (calc)
eAG (mmol/L): 8.4 (calc)

## 2019-12-26 ENCOUNTER — Encounter: Payer: Self-pay | Admitting: "Endocrinology

## 2019-12-26 ENCOUNTER — Ambulatory Visit (INDEPENDENT_AMBULATORY_CARE_PROVIDER_SITE_OTHER): Payer: Medicare HMO | Admitting: "Endocrinology

## 2019-12-26 DIAGNOSIS — E890 Postprocedural hypothyroidism: Secondary | ICD-10-CM

## 2019-12-26 DIAGNOSIS — E118 Type 2 diabetes mellitus with unspecified complications: Secondary | ICD-10-CM | POA: Diagnosis not present

## 2019-12-26 DIAGNOSIS — E782 Mixed hyperlipidemia: Secondary | ICD-10-CM | POA: Diagnosis not present

## 2019-12-26 DIAGNOSIS — Z794 Long term (current) use of insulin: Secondary | ICD-10-CM

## 2019-12-26 DIAGNOSIS — I10 Essential (primary) hypertension: Secondary | ICD-10-CM

## 2019-12-26 DIAGNOSIS — E559 Vitamin D deficiency, unspecified: Secondary | ICD-10-CM

## 2019-12-26 NOTE — Progress Notes (Signed)
12/26/2019                                                    Endocrinology Telehealth Visit Follow up Note -During COVID -19 Pandemic  This visit type was conducted due to national recommendations for restrictions regarding the COVID-19 Pandemic  in an effort to limit this patient's exposure and mitigate transmission of the corona virus.  Due to her co-morbid illnesses, [name] is at  moderate to high risk for complications without adequate follow up.  This format is felt to be most appropriate for her at this time.  I connected with this patient on 12/26/2019   by telephone and verified that I am speaking with the correct person using two identifiers. [place], 06/14/1961. she has verbally consented to this visit. All issues noted in this document were discussed and addressed. The format was not optimal for physical exam.    Subjective:    Patient ID: [name], female    DOB: Sep 22, 1961. Patient is being engaged in telehealth via telephone  For management of type 2 diabetes, hyperthyroidism, hyperlipidemia.  PMD:   Stephens Shire, MD  Past Medical History:  Diagnosis Date  . Anxiety   . Coronary atherosclerosis    Cardiac CT 09/2018 with calcium score 8.7 and mild proximal LAD disease  . Essential hypertension 2009  . GERD (gastroesophageal reflux disease)   . Iron deficiency anemia   . Osteoarthritis   . Palpitations    Tachypalpitations on beta blockers since 2010  . Type 2 diabetes mellitus ([place]) 2003  . Vitamin D deficiency    Past Surgical History:  Procedure Laterality Date  . ABDOMINAL HYSTERECTOMY     History of cervical cancer  . TONSILLECTOMY     Social History   Socioeconomic History  . Marital status: Single    Spouse name: Not on file  . Number of children: 2  . Years of education: Not on file  . Highest education level: Not on file  Occupational History  . Occupation: DISABLED    Employer: UNEMPLOYED    Comment: OSTEOARTHRITIS  Tobacco Use   . Smoking status: Never Smoker  . Smokeless tobacco: Never Used  Substance and Sexual Activity  . Alcohol use: No  . Drug use: No  . Sexual activity: Never  Other Topics Concern  . Not on file  Social History Narrative   Has 2 grandchildren. 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 Difficulty of Paying Living Expenses: Not on file  Food Insecurity:   . Worried About Charity fundraiser in the Last Year: Not on file  . Ran Out of Food in the Last Year: Not on file  Transportation Needs:   . Lack of Transportation (Medical): Not on file  . Lack of Transportation (Non-Medical): Not on file  Physical Activity:   . Days of Exercise per Week: Not on file  . Minutes of Exercise per Session: Not on file  Stress:   . Feeling of Stress : Not on file  Social Connections:   . Frequency of Communication with Friends and Family: Not on file  . Frequency of Social Gatherings with Friends and Family: Not on file  .  Attends Religious Services: Not on file  . Active Member of Clubs or Organizations: Not on file  . Attends Archivist Meetings: Not on file  . Marital Status: Not on file   Outpatient Encounter Medications as of 12/26/2019  Medication Sig  . aspirin EC 81 MG tablet Take 81 mg by mouth daily.  . blood glucose meter kit and supplies KIT Dispense based on patient and insurance preference. Use up to four times daily as directed. (FOR ICD-10 E11.65)  . Cholecalciferol (VITAMIN D3) 5000 units TABS Take 1 tablet by mouth daily.   . Cobalamin Combinations (B-12) [phone] MCG SUBL Place under the tongue.  . DULoxetine (CYMBALTA) 30 MG capsule Take 30 mg by mouth daily.  Marland Kitchen FERROUSUL 325 (65 Fe) MG tablet Take 1 tablet by mouth daily.  Marland Kitchen gabapentin (NEURONTIN) 400 MG capsule Take 400 mg by mouth 3 (three) times daily.  Marland Kitchen losartan (COZAAR) 100 MG tablet Take 100 mg by mouth daily.  . magnesium  oxide (MAG-OX) 400 MG tablet Take 400 mg by mouth 4 (four) times daily.   . meloxicam (MOBIC) 15 MG tablet Take 15 mg by mouth at bedtime.  . metFORMIN (GLUCOPHAGE) 1000 MG tablet TAKE 1 TABLET TWICE DAILY  . pantoprazole (PROTONIX) 40 MG tablet Take 40 mg by mouth daily.    . pravastatin (PRAVACHOL) 20 MG tablet Take 20 mg by mouth daily.  . TRULICITY 1.5 DX/4.1OI SOPN INJECT  1.5MG  INTO  THE  SKIN EVERY WEEK   No facility-administered encounter medications on file as of 12/26/2019.   ALLERGIES: Allergies  Allergen Reactions  . Contrast Media [Iodinated Diagnostic Agents]   . Sulfa Antibiotics Nausea And Vomiting    Other reaction(s): GI Upset (intolerance) unknown  . Doxepin Rash and Swelling   VACCINATION STATUS: Immunization History  Administered Date(s) Administered  . Tdap 11/24/2007    Diabetes She presents for her follow-up diabetic visit. She has type 2 diabetes mellitus. Onset time: She was diagnosed at approximate age of 49 years. Her disease course has been worsening. There are no hypoglycemic associated symptoms. Pertinent negatives for hypoglycemia include no confusion, pallor or seizures. Pertinent negatives for diabetes include no fatigue, no polydipsia, no polyphagia and no polyuria. There are no hypoglycemic complications. Symptoms are worsening. Diabetic complications include peripheral neuropathy. Risk factors for coronary artery disease include diabetes mellitus, dyslipidemia, hypertension, obesity, family history and sedentary lifestyle. Current diabetic treatment includes intensive insulin program and oral agent (monotherapy). She is following a generally unhealthy diet. When asked about meal planning, she reported none. She has not had a previous visit with a dietitian. She never participates in exercise. ( ) An ACE inhibitor/angiotensin II receptor blocker is being taken. Eye exam is current.  Hyperlipidemia This is a chronic problem. The current episode started  more than 1 year ago. The problem is controlled. Exacerbating diseases include diabetes and obesity. Pertinent negatives include no myalgias. Current antihyperlipidemic treatment includes statins. Risk factors for coronary artery disease include dyslipidemia, diabetes mellitus, hypertension, obesity, a sedentary lifestyle, family history and post-menopausal.   View of systems: Limited as above.  Objective:    There were no vitals taken for this visit.  Wt Readings from Last 3 Encounters:  01/04/19 256 lb (116.1 kg)  11/02/18 253 lb (114.8 kg)  09/11/18 249 lb 12.5 oz (113.3 kg)     Recent Results (from the past 2160 hour(s))  Hemoglobin A1c     Status: Abnormal   Collection Time: 12/14/19  8:48 AM  Result Value Ref Range   Hgb A1c MFr Bld 6.9 (H) <5.7 % of total Hgb    Comment: For someone without known diabetes, a hemoglobin A1c value of 6.5% or greater indicates that they may have  diabetes and this should be confirmed with a follow-up  test. . For someone with known diabetes, a value <7% indicates  that their diabetes is well controlled and a value  greater than or equal to 7% indicates suboptimal  control. A1c targets should be individualized based on  duration of diabetes, age, comorbid conditions, and  other considerations. . Currently, no consensus exists regarding use of hemoglobin A1c for diagnosis of diabetes for children. .    Mean Plasma Glucose 151 (calc)   eAG (mmol/L) 8.4 (calc)  COMPLETE METABOLIC PANEL WITH GFR     Status: Abnormal   Collection Time: 12/14/19  8:48 AM  Result Value Ref Range   Glucose, Bld 150 (H) 65 - 99 mg/dL    Comment: .            Fasting reference interval . For someone without known diabetes, a glucose value >125 mg/dL indicates that they may have diabetes and this should be confirmed with a follow-up test. .    BUN 17 7 - 25 mg/dL   Creat 0.81 0.50 - 0.99 mg/dL    Comment: For patients >56 years of age, the reference  limit for Creatinine is approximately 13% higher for people identified as African-American. .    GFR, Est Non African American 78 > OR = 60 mL/min/1.48m   GFR, Est African American 90 > OR = 60 mL/min/1.717m  BUN/Creatinine Ratio NOT APPLICABLE 6 - 22 (calc)   Sodium 139 135 - 146 mmol/L   Potassium 4.2 3.5 - 5.3 mmol/L   Chloride 103 98 - 110 mmol/L   CO2 30 20 - 32 mmol/L   Calcium 9.2 8.6 - 10.4 mg/dL   Total Protein 6.3 6.1 - 8.1 g/dL   Albumin 3.9 3.6 - 5.1 g/dL   Globulin 2.4 1.9 - 3.7 g/dL (calc)   AG Ratio 1.6 1.0 - 2.5 (calc)   Total Bilirubin 0.4 0.2 - 1.2 mg/dL   Alkaline phosphatase (APISO) 53 37 - 153 U/L   AST 20 10 - 35 U/L   ALT 16 6 - 29 U/L  TSH     Status: None   Collection Time: 12/14/19  8:48 AM  Result Value Ref Range   TSH 4.20 0.40 - 4.50 mIU/L  T4, free     Status: None   Collection Time: 12/14/19  8:48 AM  Result Value Ref Range   Free T4 1.0 0.8 - 1.8 ng/dL    Lipid Panel     Component Value Date/Time   CHOL 134 02/24/2017 0000   TRIG 87 02/24/2017 0000   HDL 49 02/24/2017 0000   LDLCALC 75 02/24/2017 0000     Assessment & Plan:   1. Type 2 diabetes mellitus with complication, with long-term current use of insulin (HCTrego- Patient has currently controlled type 2 DM since  [age]ears of age. -Her previsit labs show stable A1c of 6.9%, off of insulin for the last 6 months.     Recent labs reviewed.   Her diabetes is complicated by obesity/sedentary life, peripheral neuropathy and patient remains at a high risk for more acute and chronic complications which include CAD, CVA, CKD, retinopathy, and neuropathy. These are all discussed in detail with the  patient.  - I have counseled the patient on diet management and weight loss, by adopting a carbohydrate restricted/protein rich diet.  - she  admits there is a room for improvement in her diet and drink choices. -  Suggestion is made for her to avoid simple carbohydrates  from her diet  including Cakes, Sweet Desserts / Pastries, Ice Cream, Soda (diet and regular), Sweet Tea, Candies, Chips, Cookies, Sweet Pastries,  Store Bought Juices, Alcohol in Excess of  1-2 drinks a day, Artificial Sweeteners, Coffee Creamer, and "Sugar-free" Products. This will help patient to have stable blood glucose profile and potentially avoid unintended weight gain.  - I encouraged the patient to switch to  unprocessed or minimally processed complex starch and increased protein intake (animal or plant source), fruits, and vegetables.  - Patient is advised to stick to a routine mealtimes to eat 3 meals  a day and avoid unnecessary snacks ( to snack only to correct hypoglycemia).    - I have approached patient with the following individualized plan to manage diabetes and patient agrees:   -She is advised to continue off of insulin for now.  She will continue to benefit from metformin and Trulicity.  She is advised to continue metformin 1000 mg p.o. twice daily, and Trulicity 1.5 mg subcutaneously weekly.   - Patient specific target  A1c;  LDL, HDL, Triglycerides, and  Waist Circumference were discussed in detail.   2) Lipids/HPL: Recent lipid panel showed controlled LDL at 75.  She is advised to continue pravastatin 20 mg p.o. nightly.    Side effects and precautions discussed with her.    3) RAI induced hypothyroidism   -She is status post ablative therapy with I-131 for hyperthyroidism on May 16, 2018.  Her previsit labs are consistent with treatment effect, and hypothyroidism.  She will benefit from early initiation of levothyroxine replacement.  I discussed and initiated levothyroxine 50 mcg p.o. daily before breakfast.    - We discussed about the correct intake of her thyroid hormone, on empty stomach at fasting, with water, separated by at least 30 minutes from breakfast and other medications,  and separated by more than 4 hours from calcium, iron, multivitamins, acid reflux medications  (PPIs). -Patient is made aware of the fact that thyroid hormone replacement is needed for life, dose to be adjusted by periodic monitoring of thyroid function tests.   - I advised patient to maintain close follow up with Stephens Shire, MD for primary care needs.  - Time spent on this patient care encounter:  35 min, of which >50% was spent in  counseling and the rest reviewing her  current and  previous labs/studies ( including abstraction from other facilities),  previous treatments, her blood glucose readings, and medications' doses and developing a plan for long-term care based on the latest recommendations for standards of care; and documenting her care.  [name] participated in the discussions, expressed understanding, and voiced agreement with the above plans.  All questions were answered to her satisfaction. she is encouraged to contact clinic should she have any questions or concerns prior to her return visit.   Follow up plan: - Return in about 4 months (around 04/24/2020) for Follow up with Pre-visit Labs, Next Visit A1c in Office.  Glade Lloyd, MD Phone: [phone]  Fax: [phone]  -  This note was partially dictated with voice recognition software. Similar sounding words can be transcribed inadequately or may not  be corrected upon review.  12/26/2019,  7:20 PM

## 2019-12-26 NOTE — Patient Instructions (Signed)
                                     Advice for Weight Management  -For most of us the best way to lose weight is by diet management. Generally speaking, diet management means consuming less calories intentionally which over time brings about progressive weight loss.  This can be achieved more effectively by restricting carbohydrate consumption to the minimum possible.  So, it is critically important to know your numbers: how much calorie you are consuming and how much calorie you need. More importantly, our carbohydrates sources should be unprocessed or minimally processed complex starch food items.   Sometimes, it is important to balance nutrition by increasing protein intake (animal or plant source), fruits, and vegetables.  -Sticking to a routine mealtime to eat 3 meals a day and avoiding unnecessary snacks is shown to have a big role in weight control. Under normal circumstances, the only time we lose real weight is when we are hungry, so allow hunger to take place- hunger means no food between meal times, only water.  It is not advisable to starve.   -It is better to avoid simple carbohydrates including: Cakes, Sweet Desserts, Ice Cream, Soda (diet and regular), Sweet Tea, Candies, Chips, Cookies, Store Bought Juices, Alcohol in Excess of  1-2 drinks a day, Artificial Sweeteners, Doughnuts, Coffee Creamers, "Sugar-free" Products, etc, etc.  This is not a complete list.....    -Consulting with certified diabetes educators is proven to provide you with the most accurate and current information on diet.  Also, you may be  interested in discussing diet options/exchanges , we can schedule a visit with Penny Crumpton, RDN, CDE for individualized nutrition education.  -Exercise: If you are able: 30 -60 minutes a day ,4 days a week, or 150 minutes a week.  The longer the better.  Combine stretch, strength, and aerobic activities.  If you were told in the past that you  have high risk for cardiovascular diseases, you may seek evaluation by your heart doctor prior to initiating moderate to intense exercise programs.                                  Additional Care Considerations for Diabetes   -Diabetes  is a chronic disease.  The most important care consideration is regular follow-up with your diabetes care provider with the goal being avoiding or delaying its complications and to take advantage of advances in medications and technology.    -Type 2 diabetes is known to coexist with other important comorbidities such as high blood pressure and high cholesterol.  It is critical to control not only the diabetes but also the high blood pressure and high cholesterol to minimize and delay the risk of complications including coronary artery disease, stroke, amputations, blindness, etc.    - Studies showed that people with diabetes will benefit from a class of medications known as ACE inhibitors and statins.  Unless there are specific reasons not to be on these medications, the standard of care is to consider getting one from these groups of medications at an optimal doses.  These medications are generally considered safe and proven to help protect the heart and the kidneys.    - People with diabetes are encouraged to initiate and maintain regular follow-up with eye doctors, foot doctors, dentists ,   and if necessary heart and kidney doctors.     - It is highly recommended that people with diabetes quit smoking or stay away from smoking, and get yearly  flu vaccine and pneumonia vaccine at least every 5 years.  One other important lifestyle recommendation is to ensure adequate sleep - at least 6-7 hours of uninterrupted sleep at night.  -Exercise: If you are able: 30 -60 minutes a day, 4 days a week, or 150 minutes a week.  The longer the better.  Combine stretch, strength, and aerobic activities.  If you were told in the past that you have high risk for cardiovascular  diseases, you may seek evaluation by your heart doctor prior to initiating moderate to intense exercise programs.     COVID-19 Vaccine Information can be found at: [email or web address] For questions related to vaccine distribution or appointments, please email [email or web address] or call [phone].        

## 2020-01-16 ENCOUNTER — Telehealth: Payer: Self-pay | Admitting: "Endocrinology

## 2020-01-16 ENCOUNTER — Other Ambulatory Visit: Payer: Self-pay | Admitting: "Endocrinology

## 2020-01-16 MED ORDER — LEVOTHYROXINE SODIUM 50 MCG PO TABS: 50.0000 ug | ORAL_TABLET | Freq: Every day | ORAL | 3 refills | Status: DC

## 2020-01-16 NOTE — Telephone Encounter (Signed)
Pt.notified

## 2020-01-16 NOTE — Telephone Encounter (Signed)
She is right. It was overlooked, I sent an rx to [facility].

## 2020-01-16 NOTE — Telephone Encounter (Signed)
Patient states she did phone visit with you on 2/2. She thought you were putting her on thyroid medication. Please advise.

## 2020-01-17 ENCOUNTER — Other Ambulatory Visit: Payer: Self-pay

## 2020-01-17 DIAGNOSIS — E118 Type 2 diabetes mellitus with unspecified complications: Secondary | ICD-10-CM

## 2020-01-17 MED ORDER — TRULICITY 1.5 MG/0.5ML ~~LOC~~ SOAJ: SUBCUTANEOUS | 0 refills | Status: DC

## 2020-01-30 ENCOUNTER — Other Ambulatory Visit: Payer: Self-pay | Admitting: "Endocrinology

## 2020-02-06 ENCOUNTER — Telehealth: Payer: Self-pay

## 2020-02-06 NOTE — Telephone Encounter (Signed)

## 2020-02-06 NOTE — Progress Notes (Signed)
Virtual Visit via Telephone Note   This visit type was conducted due to national recommendations for restrictions regarding the COVID-19 Pandemic (e.g. social distancing) in an effort to limit this patient's exposure and mitigate transmission in our community.  Due to her co-morbid illnesses, this patient is at least at moderate risk for complications without adequate follow up.  This format is felt to be most appropriate for this patient at this time.  The patient did not have access to video technology/had technical difficulties with video requiring transitioning to audio format only (telephone).  All issues noted in this document were discussed and addressed.  No physical exam could be performed with this format.  Please refer to the patient's chart for her  consent to telehealth for [facility].   The patient was identified using 2 identifiers.  Date:  02/07/2020   ID:  [name], DOB [id number], MRN [id number]  Patient Location: Home Provider Location: Home  PCP:  Stephens Shire, MD  Cardiologist:  Rozann Lesches, MD Electrophysiologist:  None   Evaluation Performed:  Follow-Up Visit  Chief Complaint:  Cardiac follow-up  History of Present Illness:    [name] is a 64 y.o. female last seen in February 2020.  We spoke by phone today.  She does not report any exertional chest pain. She tells me that she has remained healthy during the pandemic, has been under stress, she lost her father around the turn of the year.  She continues to follow regularly with her PCP.  I reviewed her medications which are outlined below.  Cardiovascular regimen includes Cozaar and Pravachol.  We did talk about rationale for a baby aspirin once daily in the setting of type 2 diabetes mellitus and known coronary atherosclerosis.  She does not report any bleeding problems at baseline.  Blood pressure is elevated today.  She is on top dose of Cozaar.  We discussed exercise and stress reduction.   She may need an additional agent if blood pressure remains up at follow-up with PCP.  The patient does not have symptoms concerning for COVID-19 infection (fever, chills, cough, or new shortness of breath).    Past Medical History:  Diagnosis Date  . Anxiety   . Coronary atherosclerosis    Cardiac CT 09/2018 with calcium score 8.7 and mild proximal LAD disease  . Essential hypertension 2009  . GERD (gastroesophageal reflux disease)   . Iron deficiency anemia   . Osteoarthritis   . Palpitations    Tachypalpitations on beta blockers since 2010  . Type 2 diabetes mellitus ([place]) 2003  . Vitamin D deficiency    Past Surgical History:  Procedure Laterality Date  . ABDOMINAL HYSTERECTOMY     History of cervical cancer  . TONSILLECTOMY       Current Meds  Medication Sig  . blood glucose meter kit and supplies KIT Dispense based on patient and insurance preference. Use up to four times daily as directed. (FOR ICD-10 E11.65)  . Cholecalciferol (VITAMIN D3) 5000 units TABS Take 1 tablet by mouth daily.   . Dulaglutide (TRULICITY) 1.5 BO/1.7PZ SOPN INJECT  1.5MG  INTO  THE  SKIN EVERY WEEK  . DULoxetine (CYMBALTA) 30 MG capsule Take 30 mg by mouth daily.  Marland Kitchen gabapentin (NEURONTIN) 400 MG capsule Take 400 mg by mouth 3 (three) times daily.  Marland Kitchen levothyroxine (SYNTHROID) 50 MCG tablet Take 1 tablet (50 mcg total) by mouth daily before breakfast.  . losartan (COZAAR) 100 MG tablet Take  100 mg by mouth daily.  . magnesium oxide (MAG-OX) 400 MG tablet Take 400 mg by mouth 4 (four) times daily.   . meloxicam (MOBIC) 15 MG tablet Take 15 mg by mouth at bedtime.  . metFORMIN (GLUCOPHAGE) 1000 MG tablet TAKE 1 TABLET TWICE DAILY  . pantoprazole (PROTONIX) 40 MG tablet Take 40 mg by mouth daily.    . pravastatin (PRAVACHOL) 20 MG tablet Take 20 mg by mouth daily.     Allergies:   Contrast media [iodinated diagnostic agents], Sulfa antibiotics, and Doxepin   ROS:   No palpitations or  syncope.  Prior CV studies:   The following studies were reviewed today:  Cardiac CT 10/04/2018: FINDINGS: Non-cardiac: See separate report from [facility].  Pulmonary veins drain normally to the left atrium.  Calcium Score: 8.7 Agatston units.  Coronary Arteries: Right dominant with no anomalies  LM: No plaque or stenosis.  LAD system: Interpretation limited by motion artifact, but there is calcified plaque in the proximal LAD with mild (<50%) stenosis.  Circumflex system: Interpretation limited by motion artifact, but there does not appear to be significant disease.  RCA system: No plaque or stenosis.  IMPRESSION: 1. Coronary artery calcium score 8.7 Agatston units, placing the patient in the 68th percentile for age and gender, suggesting intermediate risk for future cardiac events.  2. Technically difficult study with motion artifact but there does not appear to be significant coronary stenosis present (suspect mild proximal LAD stenosis).  Echocardiogram 06/22/2018: Study Conclusions   - Left ventricle: The cavity size was normal. Wall thickness was  increased in a pattern of moderate LVH. Systolic function was  normal. The estimated ejection fraction was in the range of 60%  to 65%. Wall motion was normal; there were no regional wall  motion abnormalities.  - Aortic valve: Mildly calcified annulus. Trileaflet; mildly  thickened leaflets. There was mild stenosis. Valve area (VTI):  1.78 cm^2. Valve area (Vmax): 1.58 cm^2. Valve area (Vmean): 1.93  cm^2.  - Mitral valve: Mildly calcified annulus. Mildly thickened leaflets  . There was mild regurgitation.  - Left atrium: The atrium was moderately dilated.  - Atrial septum: No defect or patent foramen ovale was identified.  - Technically adequate study.  Labs/Other Tests and Data Reviewed:    EKG:  An ECG dated 09/11/2018 was personally reviewed today and demonstrated:  Sinus  tachycardia.  Recent Labs: 12/14/2019: ALT 16; BUN 17; Creat 0.81; Potassium 4.2; Sodium 139; TSH 4.20    Wt Readings from Last 3 Encounters:  02/07/20 250 lb (113.4 kg)  01/04/19 256 lb (116.1 kg)  11/02/18 253 lb (114.8 kg)     Objective:    Vital Signs:  BP (!) 152/98   Pulse 85   Ht '5\' 8"'  (1.727 m)   Wt 250 lb (113.4 kg)   BMI 38.01 kg/m    Patient spoke in full sentences, not short of breath. No audible wheezing or coughing. Speech pattern normal.  ASSESSMENT & PLAN:    1.  Coronary atherosclerosis with mild proximal LAD disease by cardiac CTA, outlined above.  She does not describe any angina at this time.  Recommended aspirin 81 mg daily, continue Pravachol and Cozaar.  Regular exercise and weight loss would also be beneficial.  2.  Essential hypertension, blood pressure is elevated today on Cozaar 100 mg daily.  Exercise and weight loss, salt restriction indicated.  If blood pressure trend remains up, could consider addition of chlorthalidone or Norvasc.  3.  Mild calcific aortic stenosis, last echocardiogram was in 2019.  We will plan clinical reevaluation in 1 year.   Time:   Today, I have spent 6 minutes with the patient with telehealth technology discussing the above problems.     Medication Adjustments/Labs and Tests Ordered: Current medicines are reviewed at length with the patient today.  Concerns regarding medicines are outlined above.   Tests Ordered: No orders of the defined types were placed in this encounter.   Medication Changes: Meds ordered this encounter  Medications  . aspirin EC 81 MG tablet    Sig: Take 1 tablet (81 mg total) by mouth daily.    Dispense:  90 tablet    Refill:  3    Follow Up:  In Person 1 year in the [place] office.  Signed, Rozann Lesches, MD  02/07/2020 9:19 AM    [place]

## 2020-02-07 ENCOUNTER — Encounter: Payer: Self-pay | Admitting: Cardiology

## 2020-02-07 ENCOUNTER — Telehealth (INDEPENDENT_AMBULATORY_CARE_PROVIDER_SITE_OTHER): Payer: Medicare HMO | Admitting: Cardiology

## 2020-02-07 VITALS — BP 152/98 | HR 85 | Ht 68.0 in | Wt 250.0 lb

## 2020-02-07 DIAGNOSIS — I25119 Atherosclerotic heart disease of native coronary artery with unspecified angina pectoris: Secondary | ICD-10-CM

## 2020-02-07 DIAGNOSIS — I350 Nonrheumatic aortic (valve) stenosis: Secondary | ICD-10-CM

## 2020-02-07 DIAGNOSIS — E782 Mixed hyperlipidemia: Secondary | ICD-10-CM

## 2020-02-07 DIAGNOSIS — I10 Essential (primary) hypertension: Secondary | ICD-10-CM | POA: Diagnosis not present

## 2020-02-07 MED ORDER — ASPIRIN EC 81 MG PO TBEC: 81.0000 mg | DELAYED_RELEASE_TABLET | Freq: Every day | ORAL | 3 refills | Status: DC

## 2020-02-07 NOTE — Patient Instructions (Signed)
Medication Instructions:  START Aspirin 81 mg daily  *If you need a refill on your cardiac medications before your next appointment, please call your pharmacy*   Lab Work: None If you have labs (blood work) drawn today and your tests are completely normal, you will receive your results only by: Marland Kitchen MyChart Message (if you have MyChart) OR . A paper copy in the mail If you have any lab test that is abnormal or we need to change your treatment, we will call you to review the results.   Testing/Procedures: none   Follow-Up: At [facility], you and your health needs are our priority.  As part of our continuing mission to provide you with exceptional heart care, we have created designated Provider Care Teams.  These Care Teams include your primary Cardiologist (physician) and Advanced Practice Providers (APPs -  Physician Assistants and Nurse Practitioners) who all work together to provide you with the care you need, when you need it.  We recommend signing up for the patient portal called "MyChart".  Sign up information is provided on this After Visit Summary.  MyChart is used to connect with patients for Virtual Visits (Telemedicine).  Patients are able to view lab/test results, encounter notes, upcoming appointments, etc.  Non-urgent messages can be sent to your provider as well.   To learn more about what you can do with MyChart, go to [email or web address].    Your next appointment:   12 month(s)  The format for your next appointment:   In Person  Provider:   Rozann Lesches, MD   Other Instructions None      Thank you for choosing [facility] !

## 2020-03-13 ENCOUNTER — Other Ambulatory Visit: Payer: Self-pay

## 2020-03-13 DIAGNOSIS — E118 Type 2 diabetes mellitus with unspecified complications: Secondary | ICD-10-CM

## 2020-03-13 MED ORDER — TRULICITY 1.5 MG/0.5ML ~~LOC~~ SOAJ: SUBCUTANEOUS | 0 refills | Status: DC

## 2020-03-21 ENCOUNTER — Telehealth: Payer: Self-pay | Admitting: "Endocrinology

## 2020-03-21 MED ORDER — LEVOTHYROXINE SODIUM 25 MCG PO TABS: 25.0000 ug | ORAL_TABLET | Freq: Every day | ORAL | 0 refills | Status: DC

## 2020-03-21 NOTE — Telephone Encounter (Signed)
Thanks

## 2020-03-21 NOTE — Telephone Encounter (Signed)
Rx for levothyroxine 37mcg sent to [facility].

## 2020-03-21 NOTE — Telephone Encounter (Signed)
She can not stop synthroid altogether. She can lower the dose to 25 mcg . She may do her labs sooner and move up her appt.

## 2020-03-21 NOTE — Telephone Encounter (Signed)
Pt states that the synthroid is making her sick on her stomach so she has stopped taking it. She said that ever since then, her sugar has increased to around 400. She has gotten it down to 200-300. She would like to know if you think she should cut the dosage in half or maybe another medication.

## 2020-03-21 NOTE — Telephone Encounter (Signed)
Can you send her in a new script? She is due for a refill. I did speak with her and advised her of below note. [place]

## 2020-04-05 ENCOUNTER — Other Ambulatory Visit: Payer: Self-pay | Admitting: "Endocrinology

## 2020-04-18 LAB — VITAMIN D 25 HYDROXY (VIT D DEFICIENCY, FRACTURES): Vit D, 25-Hydroxy: 52 ng/mL (ref 30–100)

## 2020-04-18 LAB — LIPID PANEL
Cholesterol: 106 mg/dL (ref <200)
HDL: 34 mg/dL — ABNORMAL LOW (ref >=50)
LDL Cholesterol (Calc): 56 mg/dL (calc)
Non-HDL Cholesterol (Calc): 72 mg/dL (calc) (ref <130)
Total CHOL/HDL Ratio: 3.1 (calc) (ref <5.0)
Triglycerides: 79 mg/dL (ref <150)

## 2020-04-18 LAB — MICROALBUMIN / CREATININE URINE RATIO
Creatinine, Urine: 194 mg/dL (ref 20–275)
Microalb Creat Ratio: 6 mcg/mg creat (ref <30)
Microalb, Ur: 1.1 mg/dL

## 2020-04-18 LAB — TSH: TSH: 2.7 mIU/L (ref 0.40–4.50)

## 2020-04-18 LAB — T4, FREE: Free T4: 1 ng/dL (ref 0.8–1.8)

## 2020-04-25 ENCOUNTER — Other Ambulatory Visit: Payer: Self-pay

## 2020-04-25 ENCOUNTER — Ambulatory Visit: Payer: Medicare HMO | Admitting: "Endocrinology

## 2020-04-25 ENCOUNTER — Encounter: Payer: Self-pay | Admitting: "Endocrinology

## 2020-04-25 VITALS — BP 132/86 | HR 90 | Ht 68.0 in | Wt 286.6 lb

## 2020-04-25 DIAGNOSIS — E890 Postprocedural hypothyroidism: Secondary | ICD-10-CM | POA: Diagnosis not present

## 2020-04-25 DIAGNOSIS — I10 Essential (primary) hypertension: Secondary | ICD-10-CM

## 2020-04-25 DIAGNOSIS — E782 Mixed hyperlipidemia: Secondary | ICD-10-CM

## 2020-04-25 DIAGNOSIS — E118 Type 2 diabetes mellitus with unspecified complications: Secondary | ICD-10-CM

## 2020-04-25 DIAGNOSIS — Z794 Long term (current) use of insulin: Secondary | ICD-10-CM

## 2020-04-25 LAB — POCT GLYCOSYLATED HEMOGLOBIN (HGB A1C): Hemoglobin A1C: 9 % — AB (ref 4.0–5.6)

## 2020-04-25 MED ORDER — LANTUS SOLOSTAR 100 UNIT/ML ~~LOC~~ SOPN
20.0000 [IU] | PEN_INJECTOR | Freq: Every day | SUBCUTANEOUS | 1 refills | Status: DC
Start: 2020-04-25 — End: 2020-05-15

## 2020-04-25 NOTE — Patient Instructions (Signed)

## 2020-04-25 NOTE — Progress Notes (Signed)
04/25/2020               Endocrinology follow-up note   Subjective:    Patient ID: [name], female    DOB: May 08, 1956.  She is returning for follow-up for management of type 2 diabetes, RAI induced hypothyroidism, hyperlipidemia.  PMD:   Stephens Shire, MD  Past Medical History:  Diagnosis Date  . Anxiety   . Coronary atherosclerosis    Cardiac CT 09/2018 with calcium score 8.7 and mild proximal LAD disease  . Essential hypertension 2009  . GERD (gastroesophageal reflux disease)   . Iron deficiency anemia   . Osteoarthritis   . Palpitations    Tachypalpitations on beta blockers since 2010  . Type 2 diabetes mellitus ([place]) 2003  . Vitamin D deficiency    Past Surgical History:  Procedure Laterality Date  . ABDOMINAL HYSTERECTOMY     History of cervical cancer  . TONSILLECTOMY     Social History   Socioeconomic History  . Marital status: Single    Spouse name: Not on file  . Number of children: 2  . Years of education: Not on file  . Highest education level: Not on file  Occupational History  . Occupation: DISABLED    Employer: UNEMPLOYED    Comment: OSTEOARTHRITIS  Tobacco Use  . Smoking status: Never Smoker  . Smokeless tobacco: Never Used  Substance and Sexual Activity  . Alcohol use: No  . Drug use: No  . Sexual activity: Never  Other Topics Concern  . Not on file  Social History Narrative   Has 2 grandchildren. 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 Difficulty of Paying Living Expenses:   Food Insecurity:   . Worried About Charity fundraiser in the Last Year:   . Arboriculturist in the Last Year:   Transportation Needs:   . Film/video editor (Medical):   Marland Kitchen Lack of Transportation (Non-Medical):   Physical Activity:   . Days of Exercise per Week:   . Minutes of Exercise per Session:   Stress:   . Feeling of Stress :   Social Connections:   .  Frequency of Communication with Friends and Family:   . Frequency of Social Gatherings with Friends and Family:   . Attends Religious Services:   . Active Member of Clubs or Organizations:   . Attends Archivist Meetings:   Marland Kitchen Marital Status:    Outpatient Encounter Medications as of 04/25/2020  Medication Sig  . aspirin EC 81 MG tablet Take 1 tablet (81 mg total) by mouth daily.  . blood glucose meter kit and supplies KIT Dispense based on patient and insurance preference. Use up to four times daily as directed. (FOR ICD-10 E11.65)  . Cholecalciferol (VITAMIN D3) 5000 units TABS Take 1 tablet by mouth daily.   . Dulaglutide (TRULICITY) 1.5 TJ/0.3ES SOPN INJECT  1.5MG  INTO  THE  SKIN EVERY WEEK  . DULoxetine (CYMBALTA) 30 MG capsule Take 30 mg by mouth daily.  Marland Kitchen gabapentin (NEURONTIN) 400 MG capsule Take 400 mg by mouth 3 (three) times daily.  . insulin glargine (LANTUS SOLOSTAR) 100 UNIT/ML Solostar Pen Inject 20 Units into the skin daily.  Marland Kitchen levothyroxine (SYNTHROID) 25 MCG tablet Take 1 tablet (25 mcg total) by mouth daily before breakfast.  . losartan (COZAAR) 100 MG tablet Take 100 mg by  mouth daily.  . magnesium oxide (MAG-OX) 400 MG tablet Take 400 mg by mouth 4 (four) times daily.   . meloxicam (MOBIC) 15 MG tablet Take 15 mg by mouth at bedtime.  . metFORMIN (GLUCOPHAGE) 1000 MG tablet TAKE 1 TABLET TWICE DAILY  . pantoprazole (PROTONIX) 40 MG tablet Take 40 mg by mouth daily.    . pravastatin (PRAVACHOL) 20 MG tablet Take 20 mg by mouth daily.   No facility-administered encounter medications on file as of 04/25/2020.   ALLERGIES: Allergies  Allergen Reactions  . Contrast Media [Iodinated Diagnostic Agents]   . Sulfa Antibiotics Nausea And Vomiting    Other reaction(s): GI Upset (intolerance) unknown  . Doxepin Rash and Swelling   VACCINATION STATUS: Immunization History  Administered Date(s) Administered  . Tdap 11/24/2007    Diabetes She presents for her  follow-up diabetic visit. She has type 2 diabetes mellitus. Onset time: She was diagnosed at approximate age of 24 years. Her disease course has been worsening. There are no hypoglycemic associated symptoms. Pertinent negatives for hypoglycemia include no confusion, pallor or seizures. Associated symptoms include polydipsia and polyuria. Pertinent negatives for diabetes include no fatigue and no polyphagia. There are no hypoglycemic complications. Symptoms are worsening. Diabetic complications include peripheral neuropathy. Risk factors for coronary artery disease include diabetes mellitus, dyslipidemia, hypertension, obesity, family history and sedentary lifestyle. Current diabetic treatment includes intensive insulin program and oral agent (monotherapy). Her weight is increasing steadily. She is following a generally unhealthy diet. When asked about meal planning, she reported none. She has not had a previous visit with a dietitian. She never participates in exercise. Her home blood glucose trend is increasing steadily. An ACE inhibitor/angiotensin II receptor blocker is being taken. Eye exam is current.  Hyperlipidemia This is a chronic problem. The current episode started more than 1 year ago. The problem is controlled. Exacerbating diseases include diabetes and obesity. Pertinent negatives include no myalgias. Current antihyperlipidemic treatment includes statins. Risk factors for coronary artery disease include dyslipidemia, diabetes mellitus, hypertension, obesity, a sedentary lifestyle, family history and post-menopausal.  Thyroid Problem Presents for follow-up visit. Patient reports no fatigue. Symptom course: She was given I-131 therapy for hyperthyroidism on May 16, 2018. Her past medical history is significant for diabetes and hyperlipidemia.    Review of systems  Constitutional: + Minimally fluctuating body weight,  current  Body mass index is 43.58 kg/m. , no fatigue, no subjective  hyperthermia, no subjective hypothermia Eyes: no blurry vision, no xerophthalmia ENT: no sore throat, no nodules palpated in throat, no dysphagia/odynophagia, no hoarseness Cardiovascular: no Chest Pain, no Shortness of Breath, no palpitations, no leg swelling Respiratory: no cough, no shortness of breath Gastrointestinal: no Nausea/Vomiting/Diarhhea Musculoskeletal: no muscle/joint aches Skin: no rashes, no hyperemia Neurological: no tremors, no numbness, no tingling, no dizziness Psychiatric: no depression, no anxiety   Objective:    BP 132/86   Pulse 90   Ht _0  (1.727 m)   Wt 286 lb 9.6 oz (130 kg)   BMI 43.58 kg/m   Wt Readings from Last 3 Encounters:  04/25/20 286 lb 9.6 oz (130 kg)  02/07/20 250 lb (113.4 kg)  01/04/19 256 lb (116.1 kg)     Physical Exam- Limited  Constitutional:  Body mass index is 43.58 kg/m. , not in acute distress, normal state of mind Eyes:  EOMI, no exophthalmos Neck: Supple Thyroid: No gross goiter Respiratory: Adequate breathing efforts Musculoskeletal: no gross deformities, strength intact in all four extremities, no gross restriction  of joint movements Skin:  no rashes, no hyperemia Neurological: no tremor with outstretched hands,    Recent Results (from the past 2160 hour(s))  Lipid panel     Status: Abnormal   Collection Time: 04/17/20  8:03 AM  Result Value Ref Range   Cholesterol 106 <200 mg/dL   HDL 34 (L) > OR = 50 mg/dL   Triglycerides 79 <150 mg/dL   LDL Cholesterol (Calc) 56 mg/dL (calc)    Comment: Reference range: <100 . Desirable range <100 mg/dL for primary prevention;   <70 mg/dL for patients with CHD or diabetic patients  with > or = 2 CHD risk factors. Marland Kitchen LDL-C is now calculated using the Martin-Hopkins  calculation, which is a validated novel method providing  better accuracy than the Friedewald equation in the  estimation of LDL-C.  Cresenciano Genre et al. Annamaria Helling. 6761;950(93): 2061-2068   (http://education.[facility].com/faq/FAQ164)    Total CHOL/HDL Ratio 3.1 <5.0 (calc)   Non-HDL Cholesterol (Calc) 72 <130 mg/dL (calc)    Comment: For patients with diabetes plus 1 major ASCVD risk  factor, treating to a non-HDL-C goal of <100 mg/dL  (LDL-C of <70 mg/dL) is considered a therapeutic  option.   TSH     Status: None   Collection Time: 04/17/20  8:03 AM  Result Value Ref Range   TSH 2.70 0.40 - 4.50 mIU/L  T4, free     Status: None   Collection Time: 04/17/20  8:03 AM  Result Value Ref Range   Free T4 1.0 0.8 - 1.8 ng/dL  Microalbumin / creatinine urine ratio     Status: None   Collection Time: 04/17/20  8:03 AM  Result Value Ref Range   Creatinine, Urine 194 20 - 275 mg/dL   Microalb, Ur 1.1 mg/dL    Comment: Reference Range Not established    Microalb Creat Ratio 6 <30 mcg/mg creat    Comment: . The ADA defines abnormalities in albumin excretion as follows: Marland Kitchen Category         Result (mcg/mg creatinine) . Normal                    <30 Microalbuminuria         30-299  Clinical albuminuria   > OR = 300 . The ADA recommends that at least two of three specimens collected within a 3-6 month period be abnormal before considering a patient to be within a diagnostic category.   VITAMIN D 25 Hydroxy (Vit-D Deficiency, Fractures)     Status: None   Collection Time: 04/17/20  8:03 AM  Result Value Ref Range   Vit D, 25-Hydroxy 52 30 - 100 ng/mL    Comment: Vitamin D Status         25-OH Vitamin D: . Deficiency:                    <20 ng/mL Insufficiency:             20 - 29 ng/mL Optimal:                 > or = 30 ng/mL . For 25-OH Vitamin D testing on patients on  D2-supplementation and patients for whom quantitation  of D2 and D3 fractions is required, the [facility](TM) 25-OH VIT D, (D2,D3), LC/MS/MS is recommended: order  code [id number] (patients >2yr). See Note 1 . Note 1 . For additional information, please refer to   http://education.[facility].com/faq/FAQ199  (This link is being provided  for informational/ educational purposes only.)   HgB A1c     Status: Abnormal   Collection Time: 04/25/20 10:35 AM  Result Value Ref Range   Hemoglobin A1C 9.0 (A) 4.0 - 5.6 %   HbA1c POC (<> result, manual entry)     HbA1c, POC (prediabetic range)     HbA1c, POC (controlled diabetic range)      Lipid Panel     Component Value Date/Time   CHOL 106 04/17/2020 0803   TRIG 79 04/17/2020 0803   HDL 34 (L) 04/17/2020 0803   CHOLHDL 3.1 04/17/2020 0803   LDLCALC 56 04/17/2020 0803     Assessment & Plan:   1. Type 2 diabetes mellitus with complication, with long-term current use of insulin ([place]) - Patient has currently controlled type 2 DM since  64 years of age. -She presents with a history of hyperglycemia and her point-of-care A1c is 9% increasing from 6.9% during her last visit.   Recent labs reviewed.   Her diabetes is complicated by obesity/sedentary life, peripheral neuropathy and patient remains at a high risk for more acute and chronic complications which include CAD, CVA, CKD, retinopathy, and neuropathy. These are all discussed in detail with the patient.  - I have counseled the patient on diet management and weight loss, by adopting a carbohydrate restricted/protein rich diet.  - she  admits there is a room for improvement in her diet and drink choices. -  Suggestion is made for her to avoid simple carbohydrates  from her diet including Cakes, Sweet Desserts / Pastries, Ice Cream, Soda (diet and regular), Sweet Tea, Candies, Chips, Cookies, Sweet Pastries,  Store Bought Juices, Alcohol in Excess of  1-2 drinks a day, Artificial Sweeteners, Coffee Creamer, and "Sugar-free" Products. This will help patient to have stable blood glucose profile and potentially avoid unintended weight gain.   - I encouraged the patient to switch to  unprocessed or minimally processed complex starch and increased  protein intake (animal or plant source), fruits, and vegetables.  - Patient is advised to stick to a routine mealtimes to eat 3 meals  a day and avoid unnecessary snacks ( to snack only to correct hypoglycemia).    - I have approached patient with the following individualized plan to manage diabetes and patient agrees:   -She will need re- initiation of at least basal insulin in order for her to achieve control of diabetes to target.  I discussed and initiated Lantus 20 units nightly, associated with monitoring of blood glucose 4 times a day-daily before meals and at bedtime.   -She will return in 10-15 days for reevaluation.  -She is encouraged to call clinic for glycemic profile is a 70 or greater than 200 mg per DL x3. -She will continue to benefit from metformin and Trulicity.  She is advised to continue Metformin 1000 mg p.o. twice daily, and Trulicity 1.5 mg subcutaneously weekly.    - Patient specific target  A1c;  LDL, HDL, Triglycerides, and  Waist Circumference were discussed in detail.   2) Lipids/HPL: Recent lipid panel showed controlled LDL at 56.  She is advised to continue pravastatin 20 g p.o. nightly.   Side effects and precautions discussed with her.    3) hypertension: Her blood pressure is controlled to target.  She is currently on losartan 100 mg daily.  4) RAI induced hypothyroidism   -She is status post ablative therapy with I-131 for hyperthyroidism on May 16, 2018.  Her previsit labs are consistent  with appropriate replacement.  She is advised to continue levothyroxine 50 mcg p.o. daily before breakfast.    - We discussed about the correct intake of her thyroid hormone, on empty stomach at fasting, with water, separated by at least 30 minutes from breakfast and other medications,  and separated by more than 4 hours from calcium, iron, multivitamins, acid reflux medications (PPIs). -Patient is made aware of the fact that thyroid hormone replacement is needed for life,  dose to be adjusted by periodic monitoring of thyroid function tests.   5) weight management: Her BMI is 89.2-JJHERDE complicating her diabetes care.  She has gained 30 pounds since last visit, mainly due to excessive caloric intake.  No clinical edema. She is a candidate for modest weight loss.  Carbs education was provided to patient-see above. -She is a good candidate for bariatric surgery, we discussed this option in detail on subsequent visits.    - I advised patient to maintain close follow up with Stephens Shire, MD for primary care needs.  - Time spent on this patient care encounter:  40 min, of which > 50% was spent in  counseling and the rest reviewing her blood glucose logs , discussing her hypoglycemia and hyperglycemia episodes, reviewing her current and  previous labs / studies  ( including abstraction from other facilities) and medications  doses and developing a  long term treatment plan and documenting her care.   Please refer to Patient Instructions for Blood Glucose Monitoring and Insulin/Medications Dosing Guide"  in media tab for additional information. Please  also refer to " Patient Self Inventory" in the Media  tab for reviewed elements of pertinent patient history.  [name] participated in the discussions, expressed understanding, and voiced agreement with the above plans.  All questions were answered to her satisfaction. she is encouraged to contact clinic should she have any questions or concerns prior to her return visit.   Follow up plan: - Return in about 2 weeks (around 05/09/2020) for F/U with Meter and Logs Only - no Labs.  Glade Lloyd, MD Phone: [phone]  Fax: [phone]  -  This note was partially dictated with voice recognition software. Similar sounding words can be transcribed inadequately or may not  be corrected upon review.  04/25/2020, 12:52 PM

## 2020-05-09 ENCOUNTER — Other Ambulatory Visit: Payer: Self-pay

## 2020-05-09 DIAGNOSIS — E118 Type 2 diabetes mellitus with unspecified complications: Secondary | ICD-10-CM

## 2020-05-09 MED ORDER — TRUE METRIX BLOOD GLUCOSE TEST VI STRP: ORAL_STRIP | 12 refills | Status: DC

## 2020-05-13 ENCOUNTER — Other Ambulatory Visit: Payer: Self-pay | Admitting: "Endocrinology

## 2020-05-13 DIAGNOSIS — Z794 Long term (current) use of insulin: Secondary | ICD-10-CM

## 2020-05-15 ENCOUNTER — Encounter: Payer: Self-pay | Admitting: "Endocrinology

## 2020-05-15 ENCOUNTER — Other Ambulatory Visit: Payer: Self-pay

## 2020-05-15 ENCOUNTER — Ambulatory Visit: Payer: Medicare HMO | Admitting: "Endocrinology

## 2020-05-15 VITALS — BP 133/79 | HR 97 | Ht 68.0 in | Wt 286.6 lb

## 2020-05-15 DIAGNOSIS — E782 Mixed hyperlipidemia: Secondary | ICD-10-CM | POA: Diagnosis not present

## 2020-05-15 DIAGNOSIS — E890 Postprocedural hypothyroidism: Secondary | ICD-10-CM | POA: Diagnosis not present

## 2020-05-15 DIAGNOSIS — Z794 Long term (current) use of insulin: Secondary | ICD-10-CM

## 2020-05-15 DIAGNOSIS — E118 Type 2 diabetes mellitus with unspecified complications: Secondary | ICD-10-CM

## 2020-05-15 DIAGNOSIS — I10 Essential (primary) hypertension: Secondary | ICD-10-CM | POA: Diagnosis not present

## 2020-05-15 MED ORDER — LANTUS SOLOSTAR 100 UNIT/ML ~~LOC~~ SOPN
30.0000 [IU] | PEN_INJECTOR | Freq: Every day | SUBCUTANEOUS | 1 refills | Status: DC
Start: 2020-05-15 — End: 2020-11-07

## 2020-05-15 NOTE — Patient Instructions (Signed)

## 2020-05-15 NOTE — Progress Notes (Signed)
05/15/2020               Endocrinology follow-up note   Subjective:    Patient ID: [name], female    DOB: 1956/04/30.  She is returning for follow-up for management of type 2 diabetes, RAI induced hypothyroidism, hyperlipidemia.  PMD:   Stephens Shire, MD  Past Medical History:  Diagnosis Date  . Anxiety   . Coronary atherosclerosis    Cardiac CT 09/2018 with calcium score 8.7 and mild proximal LAD disease  . Essential hypertension 2009  . GERD (gastroesophageal reflux disease)   . Iron deficiency anemia   . Osteoarthritis   . Palpitations    Tachypalpitations on beta blockers since 2010  . Type 2 diabetes mellitus ([place]) 2003  . Vitamin D deficiency    Past Surgical History:  Procedure Laterality Date  . ABDOMINAL HYSTERECTOMY     History of cervical cancer  . TONSILLECTOMY     Social History   Socioeconomic History  . Marital status: Single    Spouse name: Not on file  . Number of children: 2  . Years of education: Not on file  . Highest education level: Not on file  Occupational History  . Occupation: DISABLED    Employer: UNEMPLOYED    Comment: OSTEOARTHRITIS  Tobacco Use  . Smoking status: Never Smoker  . Smokeless tobacco: Never Used  Vaping Use  . Vaping Use: Never used  Substance and Sexual Activity  . Alcohol use: No  . Drug use: No  . Sexual activity: Never  Other Topics Concern  . Not on file  Social History Narrative   Has 2 grandchildren. 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 Difficulty of Paying Living Expenses:   Food Insecurity:   . Worried About Charity fundraiser in the Last Year:   . Arboriculturist in the Last Year:   Transportation Needs:   . Film/video editor (Medical):   Marland Kitchen Lack of Transportation (Non-Medical):   Physical Activity:   . Days of Exercise per Week:   . Minutes of Exercise per Session:   Stress:   . Feeling of  Stress :   Social Connections:   . Frequency of Communication with Friends and Family:   . Frequency of Social Gatherings with Friends and Family:   . Attends Religious Services:   . Active Member of Clubs or Organizations:   . Attends Archivist Meetings:   Marland Kitchen Marital Status:    Outpatient Encounter Medications as of 05/15/2020  Medication Sig  . aspirin EC 81 MG tablet Take 1 tablet (81 mg total) by mouth daily.  . blood glucose meter kit and supplies KIT Dispense based on patient and insurance preference. Use up to four times daily as directed. (FOR ICD-10 E11.65)  . Cholecalciferol (VITAMIN D3) 5000 units TABS Take 1 tablet by mouth daily.   . Dulaglutide (TRULICITY) 1.5 VE/7.2CN SOPN INJECT 1.5MG (1 PEN) SUBCUTANEOUSLY EVERY WEEK  . DULoxetine (CYMBALTA) 30 MG capsule Take 30 mg by mouth daily.  Marland Kitchen gabapentin (NEURONTIN) 400 MG capsule Take 400 mg by mouth 3 (three) times daily.  Marland Kitchen glucose blood (TRUE METRIX BLOOD GLUCOSE TEST) test strip Use as instructed, TEST FOUR TIMES DAILY  . insulin glargine (LANTUS SOLOSTAR) 100 UNIT/ML Solostar Pen Inject 30 Units into the skin at bedtime.  Marland Kitchen levothyroxine (SYNTHROID) 25  MCG tablet Take 1 tablet (25 mcg total) by mouth daily before breakfast.  . losartan (COZAAR) 100 MG tablet Take 100 mg by mouth daily.  . magnesium oxide (MAG-OX) 400 MG tablet Take 400 mg by mouth 4 (four) times daily.   . meloxicam (MOBIC) 15 MG tablet Take 15 mg by mouth at bedtime.  . metFORMIN (GLUCOPHAGE) 1000 MG tablet TAKE 1 TABLET TWICE DAILY  . pantoprazole (PROTONIX) 40 MG tablet Take 40 mg by mouth daily.    . pravastatin (PRAVACHOL) 20 MG tablet Take 20 mg by mouth daily.  . [DISCONTINUED] insulin glargine (LANTUS SOLOSTAR) 100 UNIT/ML Solostar Pen Inject 20 Units into the skin daily.   No facility-administered encounter medications on file as of 05/15/2020.   ALLERGIES: Allergies  Allergen Reactions  . Contrast Media [Iodinated Diagnostic Agents]    . Sulfa Antibiotics Nausea And Vomiting    Other reaction(s): GI Upset (intolerance) unknown  . Doxepin Rash and Swelling   VACCINATION STATUS: Immunization History  Administered Date(s) Administered  . Tdap 11/24/2007    Diabetes She presents for her follow-up diabetic visit. She has type 2 diabetes mellitus. Onset time: She was diagnosed at approximate age of 32 years. Her disease course has been improving. There are no hypoglycemic associated symptoms. Pertinent negatives for hypoglycemia include no confusion, pallor or seizures. Associated symptoms include polydipsia and polyuria. Pertinent negatives for diabetes include no fatigue and no polyphagia. There are no hypoglycemic complications. Symptoms are improving. Diabetic complications include peripheral neuropathy. Risk factors for coronary artery disease include diabetes mellitus, dyslipidemia, hypertension, obesity, family history and sedentary lifestyle. Current diabetic treatment includes intensive insulin program and oral agent (monotherapy). Her weight is stable. She is following a generally unhealthy diet. When asked about meal planning, she reported none. She has not had a previous visit with a dietitian. She never participates in exercise. Her home blood glucose trend is decreasing steadily. Her breakfast blood glucose range is generally 140-180 mg/dl. Her lunch blood glucose range is generally 140-180 mg/dl. Her dinner blood glucose range is generally 140-180 mg/dl. Her bedtime blood glucose range is generally 140-180 mg/dl. Her overall blood glucose range is 140-180 mg/dl. Stanton Kidney returns with significant improvement in her glycemic profile, still above target.  Her most recent A1c was 9%.  She did not document or report hypoglycemia.) An ACE inhibitor/angiotensin II receptor blocker is being taken. Eye exam is current.  Hyperlipidemia This is a chronic problem. The current episode started more than 1 year ago. The problem is  controlled. Exacerbating diseases include diabetes and obesity. Pertinent negatives include no myalgias. Current antihyperlipidemic treatment includes statins. Risk factors for coronary artery disease include dyslipidemia, diabetes mellitus, hypertension, obesity, a sedentary lifestyle, family history and post-menopausal.  Thyroid Problem Presents for follow-up visit. Patient reports no fatigue. Symptom course: She was given I-131 therapy for hyperthyroidism on May 16, 2018. Her past medical history is significant for diabetes and hyperlipidemia.    Review of systems  Constitutional: + Minimally fluctuating body weight,  + fatigue, current  Body mass index is 43.58 kg/m. , no fatigue, no subjective hyperthermia, no subjective hypothermia Eyes: no blurry vision, no xerophthalmia ENT: no sore throat, no nodules palpated in throat, no dysphagia/odynophagia, no hoarseness Cardiovascular: no Chest Pain, no Shortness of Breath, no palpitations, no leg swelling Respiratory: no cough, no shortness of breath Gastrointestinal: no Nausea/Vomiting/Diarhhea Musculoskeletal: no muscle/joint aches Skin: no rashes, no hyperemia Neurological: no tremors, no numbness, no tingling, no dizziness Psychiatric: no depression, no anxiety  Objective:    BP 133/79   Pulse 97   Ht _0  (1.727 m)   Wt 286 lb 9.6 oz (130 kg)   BMI 43.58 kg/m   Wt Readings from Last 3 Encounters:  05/15/20 286 lb 9.6 oz (130 kg)  04/25/20 286 lb 9.6 oz (130 kg)  02/07/20 250 lb (113.4 kg)      Physical Exam- Limited  Constitutional:  Body mass index is 43.58 kg/m. , not in acute distress, normal state of mind Eyes:  EOMI, no exophthalmos Neck: Supple Thyroid: No gross goiter Respiratory: Adequate breathing efforts Musculoskeletal: no gross deformities, strength intact in all four extremities, no gross restriction of joint movements Skin:  no rashes, no hyperemia Neurological: no tremor with outstretched hands,      Recent Results (from the past 2160 hour(s))  Lipid panel     Status: Abnormal   Collection Time: 04/17/20  8:03 AM  Result Value Ref Range   Cholesterol 106 <200 mg/dL   HDL 34 (L) > OR = 50 mg/dL   Triglycerides 79 <150 mg/dL   LDL Cholesterol (Calc) 56 mg/dL (calc)    Comment: Reference range: <100 . Desirable range <100 mg/dL for primary prevention;   <70 mg/dL for patients with CHD or diabetic patients  with > or = 2 CHD risk factors. Marland Kitchen LDL-C is now calculated using the Martin-Hopkins  calculation, which is a validated novel method providing  better accuracy than the Friedewald equation in the  estimation of LDL-C.  Cresenciano Genre et al. Annamaria Helling. 3559;741(63): 2061-2068  (http://education.[facility].com/faq/FAQ164)    Total CHOL/HDL Ratio 3.1 <5.0 (calc)   Non-HDL Cholesterol (Calc) 72 <130 mg/dL (calc)    Comment: For patients with diabetes plus 1 major ASCVD risk  factor, treating to a non-HDL-C goal of <100 mg/dL  (LDL-C of <70 mg/dL) is considered a therapeutic  option.   TSH     Status: None   Collection Time: 04/17/20  8:03 AM  Result Value Ref Range   TSH 2.70 0.40 - 4.50 mIU/L  T4, free     Status: None   Collection Time: 04/17/20  8:03 AM  Result Value Ref Range   Free T4 1.0 0.8 - 1.8 ng/dL  Microalbumin / creatinine urine ratio     Status: None   Collection Time: 04/17/20  8:03 AM  Result Value Ref Range   Creatinine, Urine 194 20 - 275 mg/dL   Microalb, Ur 1.1 mg/dL    Comment: Reference Range Not established    Microalb Creat Ratio 6 <30 mcg/mg creat    Comment: . The ADA defines abnormalities in albumin excretion as follows: Marland Kitchen Category         Result (mcg/mg creatinine) . Normal                    <30 Microalbuminuria         30-299  Clinical albuminuria   > OR = 300 . The ADA recommends that at least two of three specimens collected within a 3-6 month period be abnormal before considering a patient to be within a diagnostic  category.   VITAMIN D 25 Hydroxy (Vit-D Deficiency, Fractures)     Status: None   Collection Time: 04/17/20  8:03 AM  Result Value Ref Range   Vit D, 25-Hydroxy 52 30 - 100 ng/mL    Comment: Vitamin D Status         25-OH Vitamin D: . Deficiency:                    <  20 ng/mL Insufficiency:             20 - 29 ng/mL Optimal:                 > or = 30 ng/mL . For 25-OH Vitamin D testing on patients on  D2-supplementation and patients for whom quantitation  of D2 and D3 fractions is required, the [facility](TM) 25-OH VIT D, (D2,D3), LC/MS/MS is recommended: order  code [phone] (patients >14yr). See Note 1 . Note 1 . For additional information, please refer to  http://education.[facility].com/faq/FAQ199  (This link is being provided for informational/ educational purposes only.)   HgB A1c     Status: Abnormal   Collection Time: 04/25/20 10:35 AM  Result Value Ref Range   Hemoglobin A1C 9.0 (A) 4.0 - 5.6 %   HbA1c POC (<> result, manual entry)     HbA1c, POC (prediabetic range)     HbA1c, POC (controlled diabetic range)      Lipid Panel     Component Value Date/Time   CHOL 106 04/17/2020 0803   TRIG 79 04/17/2020 0803   HDL 34 (L) 04/17/2020 0803   CHOLHDL 3.1 04/17/2020 0803   LDLCALC 56 04/17/2020 0803     Assessment & Plan:   1. Type 2 diabetes mellitus with complication, with long-term current use of insulin ([place] - Patient has currently controlled type 2 DM since  64years of age.  MMariesareturns with significant improvement in her glycemic profile, still above target.  Her most recent A1c was 9%.  She did not document or report hypoglycemia.    Recent labs reviewed.   Her diabetes is complicated by obesity/sedentary life, peripheral neuropathy and patient remains at a high risk for more acute and chronic complications which include CAD, CVA, CKD, retinopathy, and neuropathy. These are all discussed in detail with the patient.  - I have counseled the  patient on diet management and weight loss, by adopting a carbohydrate restricted/protein rich diet.  - she  admits there is a room for improvement in her diet and drink choices. -  Suggestion is made for her to avoid simple carbohydrates  from her diet including Cakes, Sweet Desserts / Pastries, Ice Cream, Soda (diet and regular), Sweet Tea, Candies, Chips, Cookies, Sweet Pastries,  Store Bought Juices, Alcohol in Excess of  1-2 drinks a day, Artificial Sweeteners, Coffee Creamer, and "Sugar-free" Products. This will help patient to have stable blood glucose profile and potentially avoid unintended weight gain.   - I encouraged the patient to switch to  unprocessed or minimally processed complex starch and increased protein intake (animal or plant source), fruits, and vegetables.  - Patient is advised to stick to a routine mealtimes to eat 3 meals  a day and avoid unnecessary snacks ( to snack only to correct hypoglycemia).    - I have approached patient with the following individualized plan to manage diabetes and patient agrees:   -She has benefited from re- initiation of basal insulin , will continue to need it in order for her to achieve control of diabetes to target.  -I discussed and increase her Lantus to 30 units nightly, associated with monitoring of blood glucose twice a day-daily before breakfast and at bedtime.   -She is encouraged to call clinic for glycemic profile is a 70 or greater than 200 mg per DL x3. -She will continue to benefit from metformin and Trulicity.  She is advised to continue Metformin 1000 mg p.o. twice daily,  and Trulicity 1.5 mg subcutaneously weekly.    - Patient specific target  A1c;  LDL, HDL, Triglycerides were discussed in detail.   2) Lipids/HPL: Recent lipid panel showed controlled LDL at 56.  She is advised to continue pravastatin 20 g p.o. nightly.   Side effects and precautions discussed with her.    3) hypertension: Her blood pressure is  controlled to target.  She is currently on losartan 100 mg daily.  4) RAI induced hypothyroidism   -She is status post ablative therapy with I-131 for hyperthyroidism on May 16, 2018.  Her previsit labs are consistent with appropriate replacement.  She is advised to continue levothyroxine  25  mcg p.o. daily before breakfast.    - We discussed about the correct intake of her thyroid hormone, on empty stomach at fasting, with water, separated by at least 30 minutes from breakfast and other medications,  and separated by more than 4 hours from calcium, iron, multivitamins, acid reflux medications (PPIs). -Patient is made aware of the fact that thyroid hormone replacement is needed for life, dose to be adjusted by periodic monitoring of thyroid function tests.   5) weight management: Her BMI is 04.5-[id number] complicating her diabetes care.  She has gained 30 pounds since last visit, mainly due to excessive caloric intake.  No clinical edema. She is a candidate for modest weight loss.  Carbs education was provided to patient-see above. -She is a good candidate for bariatric surgery, we discussed this option in detail on subsequent visits.  -Given her complaint of unexplained fatigue, and her risk of coronary disease, she is advised to make appointment with her cardiologist and possibly get stress test.    - I advised patient to maintain close follow up with Stephens Shire, MD for primary care needs.  - Time spent on this patient care encounter:  35 min, of which > 50% was spent in  counseling and the rest reviewing her blood glucose logs , discussing her hypoglycemia and hyperglycemia episodes, reviewing her current and  previous labs / studies  ( including abstraction from other facilities) and medications  doses and developing a  long term treatment plan and documenting her care.   Please refer to Patient Instructions for Blood Glucose Monitoring and Insulin/Medications Dosing Guide"  in media  tab for additional information. Please  also refer to " Patient Self Inventory" in the Media  tab for reviewed elements of pertinent patient history.  Natalyia E Heinemann participated in the discussions, expressed understanding, and voiced agreement with the above plans.  All questions were answered to her satisfaction. she is encouraged to contact clinic should she have any questions or concerns prior to her return visit.    Follow up plan: - Return in about 3 months (around 08/15/2020) for Bring Meter and Logs- A1c in Office.  Glade Lloyd, MD Phone: [phone]  Fax: [phone]  -  This note was partially dictated with voice recognition software. Similar sounding words can be transcribed inadequately or may not  be corrected upon review.  05/15/2020, 5:40 PM

## 2020-05-17 ENCOUNTER — Other Ambulatory Visit: Payer: Self-pay | Admitting: "Endocrinology

## 2020-06-10 ENCOUNTER — Other Ambulatory Visit: Payer: Self-pay | Admitting: "Endocrinology

## 2020-06-11 ENCOUNTER — Other Ambulatory Visit: Payer: Self-pay

## 2020-06-11 DIAGNOSIS — E118 Type 2 diabetes mellitus with unspecified complications: Secondary | ICD-10-CM

## 2020-06-11 MED ORDER — DROPLET PEN NEEDLES 31G X 5 MM MISC: 3 refills | Status: DC

## 2020-07-15 ENCOUNTER — Other Ambulatory Visit: Payer: Self-pay | Admitting: "Endocrinology

## 2020-07-15 DIAGNOSIS — E118 Type 2 diabetes mellitus with unspecified complications: Secondary | ICD-10-CM

## 2020-07-25 ENCOUNTER — Other Ambulatory Visit: Payer: Self-pay | Admitting: "Endocrinology

## 2020-08-15 ENCOUNTER — Encounter: Payer: Self-pay | Admitting: Nurse Practitioner

## 2020-08-15 ENCOUNTER — Other Ambulatory Visit: Payer: Self-pay

## 2020-08-15 ENCOUNTER — Ambulatory Visit (INDEPENDENT_AMBULATORY_CARE_PROVIDER_SITE_OTHER): Payer: Medicare HMO | Admitting: Nurse Practitioner

## 2020-08-15 VITALS — BP 111/76 | HR 91 | Ht 68.0 in | Wt 275.2 lb

## 2020-08-15 DIAGNOSIS — E890 Postprocedural hypothyroidism: Secondary | ICD-10-CM | POA: Diagnosis not present

## 2020-08-15 DIAGNOSIS — E782 Mixed hyperlipidemia: Secondary | ICD-10-CM

## 2020-08-15 DIAGNOSIS — E118 Type 2 diabetes mellitus with unspecified complications: Secondary | ICD-10-CM

## 2020-08-15 DIAGNOSIS — E559 Vitamin D deficiency, unspecified: Secondary | ICD-10-CM

## 2020-08-15 DIAGNOSIS — Z794 Long term (current) use of insulin: Secondary | ICD-10-CM

## 2020-08-15 DIAGNOSIS — I10 Essential (primary) hypertension: Secondary | ICD-10-CM

## 2020-08-15 LAB — POCT GLYCOSYLATED HEMOGLOBIN (HGB A1C): Hemoglobin A1C: 5.8 % — AB (ref 4.0–5.6)

## 2020-08-15 NOTE — Patient Instructions (Signed)

## 2020-08-15 NOTE — Progress Notes (Signed)
08/15/2020               Endocrinology follow-up note   Subjective:    Patient ID: [name], female    DOB: 06-10-1956.  She is returning for follow-up for management of type 2 diabetes, RAI induced hypothyroidism, hyperlipidemia.  PMD:   Stephens Shire, MD  Past Medical History:  Diagnosis Date  . Anxiety   . Coronary atherosclerosis    Cardiac CT 09/2018 with calcium score 8.7 and mild proximal LAD disease  . Essential hypertension 2009  . GERD (gastroesophageal reflux disease)   . Iron deficiency anemia   . Osteoarthritis   . Palpitations    Tachypalpitations on beta blockers since 2010  . Type 2 diabetes mellitus ([place]) 2003  . Vitamin D deficiency    Past Surgical History:  Procedure Laterality Date  . ABDOMINAL HYSTERECTOMY     History of cervical cancer  . TONSILLECTOMY     Social History   Socioeconomic History  . Marital status: Single    Spouse name: Not on file  . Number of children: 2  . Years of education: Not on file  . Highest education level: Not on file  Occupational History  . Occupation: DISABLED    Employer: UNEMPLOYED    Comment: OSTEOARTHRITIS  Tobacco Use  . Smoking status: Never Smoker  . Smokeless tobacco: Never Used  Vaping Use  . Vaping Use: Never used  Substance and Sexual Activity  . Alcohol use: No  . Drug use: No  . Sexual activity: Never  Other Topics Concern  . Not on file  Social History Narrative   Has 2 grandchildren. 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 Difficulty of Paying Living Expenses: Not on file  Food Insecurity:   . Worried About Charity fundraiser in the Last Year: Not on file  . Ran Out of Food in the Last Year: Not on file  Transportation Needs:   . Lack of Transportation (Medical): Not on file  . Lack of Transportation (Non-Medical): Not on file  Physical Activity:   . Days of Exercise per Week: Not on  file  . Minutes of Exercise per Session: Not on file  Stress:   . Feeling of Stress : Not on file  Social Connections:   . Frequency of Communication with Friends and Family: Not on file  . Frequency of Social Gatherings with Friends and Family: Not on file  . Attends Religious Services: Not on file  . Active Member of Clubs or Organizations: Not on file  . Attends Archivist Meetings: Not on file  . Marital Status: Not on file   Outpatient Encounter Medications as of 08/15/2020  Medication Sig  . blood glucose meter kit and supplies KIT Dispense based on patient and insurance preference. Use up to four times daily as directed. (FOR ICD-10 E11.65)  . Cholecalciferol (VITAMIN D3) 5000 units TABS Take 1 tablet by mouth daily.   . DULoxetine (CYMBALTA) 60 MG capsule   . gabapentin (NEURONTIN) 400 MG capsule Take 400 mg by mouth 3 (three) times daily.  Marland Kitchen glucose blood (TRUE METRIX BLOOD GLUCOSE TEST) test strip Use as instructed, TEST FOUR TIMES DAILY  . insulin glargine (LANTUS SOLOSTAR) 100 UNIT/ML Solostar Pen Inject 30 Units into the skin at bedtime.  . Insulin Pen Needle (DROPLET PEN NEEDLES) 31G X 5 MM  MISC USE AS INSTRUCTED TO INJECT INSULIN DAILY  . levothyroxine (SYNTHROID) 25 MCG tablet TAKE 1 TABLET EVERY DAY BEFORE BREAKFAST  . losartan (COZAAR) 100 MG tablet Take 100 mg by mouth daily.  . magnesium oxide (MAG-OX) 400 MG tablet Take 400 mg by mouth 4 (four) times daily.   . meloxicam (MOBIC) 15 MG tablet Take 15 mg by mouth at bedtime.  . metFORMIN (GLUCOPHAGE) 1000 MG tablet TAKE 1 TABLET TWICE DAILY  . pantoprazole (PROTONIX) 40 MG tablet Take 40 mg by mouth daily.    . pravastatin (PRAVACHOL) 20 MG tablet Take 20 mg by mouth daily.  . TRULICITY 1.5 EL/3.8BO SOPN INJECT 1.5MG (1 PEN) SUBCUTANEOUSLY EVERY WEEK  . [DISCONTINUED] aspirin EC 81 MG tablet Take 1 tablet (81 mg total) by mouth daily.   No facility-administered encounter medications on file as of 08/15/2020.    ALLERGIES: Allergies  Allergen Reactions  . Contrast Media [Iodinated Diagnostic Agents]   . Sulfa Antibiotics Nausea And Vomiting    Other reaction(s): GI Upset (intolerance) unknown  . Doxepin Rash and Swelling   VACCINATION STATUS: Immunization History  Administered Date(s) Administered  . Tdap 11/24/2007    Diabetes She presents for her follow-up diabetic visit. She has type 2 diabetes mellitus. Onset time: She was diagnosed at approximate age of 64 years. Her disease course has been improving. Hypoglycemia symptoms include tremors. Pertinent negatives for hypoglycemia include no confusion, nervousness/anxiousness, pallor or seizures. Associated symptoms include weight loss. Pertinent negatives for diabetes include no fatigue, no polydipsia, no polyphagia and no polyuria. There are no hypoglycemic complications. Symptoms are improving. Diabetic complications include peripheral neuropathy. Risk factors for coronary artery disease include diabetes mellitus, dyslipidemia, hypertension, obesity, family history and sedentary lifestyle. Current diabetic treatment includes oral agent (monotherapy) and insulin injections. She is compliant with treatment all of the time. Her weight is decreasing rapidly. She is following a generally healthy (started new diet Optiva) diet. Meal planning includes calorie counting, carbohydrate counting and avoidance of concentrated sweets. She has not had a previous visit with a dietitian. She participates in exercise intermittently. Her home blood glucose trend is decreasing steadily. Her breakfast blood glucose range is generally 110-130 mg/dl. Her bedtime blood glucose range is generally 110-130 mg/dl. Her overall blood glucose range is 110-130 mg/dl. (She presents today with her meter and logs showing near target glycemic profile overall.  Her POCT A1C is 5.8%, improving drastically from last visit of 9%.  She has started a new diet since last visit (Optiva) and has  lost a significant amount of weight.  She denies any episodes of hypoglycemia.) An ACE inhibitor/angiotensin II receptor blocker is being taken. She does not see a podiatrist.Eye exam is current.  Hyperlipidemia This is a chronic problem. The current episode started more than 1 year ago. The problem is controlled. Recent lipid tests were reviewed and are normal. Exacerbating diseases include diabetes and obesity. There are no known factors aggravating her hyperlipidemia. Pertinent negatives include no myalgias. Current antihyperlipidemic treatment includes statins and exercise. The current treatment provides moderate improvement of lipids. There are no compliance problems.  Risk factors for coronary artery disease include dyslipidemia, diabetes mellitus, hypertension, obesity, a sedentary lifestyle, family history and post-menopausal.  Thyroid Problem Presents for follow-up visit. Symptoms include tremors and weight loss. Patient reports no anxiety, cold intolerance, constipation, depressed mood, diarrhea, fatigue or heat intolerance. The symptoms have been stable (She was given I-131 therapy for hyperthyroidism on May 16, 2018.). Her past medical history  is significant for diabetes and hyperlipidemia.    Review of systems  Constitutional: + steady decrease in body weight,  current Body mass index is 41.84 kg/m. , no fatigue, no subjective hyperthermia, no subjective hypothermia Eyes: no blurry vision, no xerophthalmia ENT: no sore throat, no nodules palpated in throat, no dysphagia/odynophagia, no hoarseness Cardiovascular: no chest pain, no shortness of breath, no palpitations, no leg swelling Respiratory: no cough, no shortness of breath Gastrointestinal: no nausea/vomiting/diarrhea Musculoskeletal: no muscle/joint aches Skin: no rashes, no hyperemia Neurological: some mild tremors, no numbness, no tingling, no dizziness Psychiatric: no depression, no anxiety   Objective:    BP 111/76 (BP  Location: Left Arm, Patient Position: Sitting)   Pulse 91   Ht 5' 8" (1.727 m)   Wt 275 lb 3.2 oz (124.8 kg)   BMI 41.84 kg/m   Wt Readings from Last 3 Encounters:  08/15/20 275 lb 3.2 oz (124.8 kg)  05/15/20 286 lb 9.6 oz (130 kg)  04/25/20 286 lb 9.6 oz (130 kg)    BP Readings from Last 3 Encounters:  08/15/20 111/76  05/15/20 133/79  04/25/20 132/86    Physical Exam- Limited  Constitutional:  Body mass index is 41.84 kg/m. , not in acute distress, normal state of mind Eyes:  EOMI, no exophthalmos Neck: Supple Thyroid: No gross goiter Cardiovascular: RRR, no murmers, rubs, or gallops, no edema Respiratory: Adequate breathing efforts, no crackles, rales, rhonchi, or wheezing Musculoskeletal: no gross deformities, strength intact in all four extremities, no gross restriction of joint movements Skin:  no rashes, no hyperemia Neurological: no tremor with outstretched hands   Recent Results (from the past 2160 hour(s))  HgB A1c     Status: Abnormal   Collection Time: 08/15/20 11:42 AM  Result Value Ref Range   Hemoglobin A1C 5.8 (A) 4.0 - 5.6 %   HbA1c POC (<> result, manual entry)     HbA1c, POC (prediabetic range)     HbA1c, POC (controlled diabetic range)      Lipid Panel     Component Value Date/Time   CHOL 106 04/17/2020 0803   TRIG 79 04/17/2020 0803   HDL 34 (L) 04/17/2020 0803   CHOLHDL 3.1 04/17/2020 0803   LDLCALC 56 04/17/2020 0803     Assessment & Plan:   1. Type 2 diabetes mellitus with complication, with long-term current use of insulin ([place]) - Patient has currently controlled type 2 DM since  64 years of age.  She presents today with her meter and logs showing near target glycemic profile overall.  Her POCT A1C is 5.8%, improving drastically from last visit of 9%.  She has started a new diet since last visit (Optiva) and has lost a significant amount of weight.  She denies any episodes of hypoglycemia.   Recent labs reviewed.   Her diabetes is  complicated by obesity/sedentary life, peripheral neuropathy and patient remains at a high risk for more acute and chronic complications which include CAD, CVA, CKD, retinopathy, and neuropathy. These are all discussed in detail with the patient.  - I have counseled the patient on diet management and weight loss, by adopting a carbohydrate restricted/protein rich diet.  - The patient admits there is a room for improvement in their diet and drink choices. -  Suggestion is made for the patient to avoid simple carbohydrates from their diet including Cakes, Sweet Desserts / Pastries, Ice Cream, Soda (diet and regular), Sweet Tea, Candies, Chips, Cookies, Sweet Pastries,  Store Bought Juices, Alcohol  in Excess of  1-2 drinks a day, Artificial Sweeteners, Coffee Creamer, and "Sugar-free" Products. This will help patient to have stable blood glucose profile and potentially avoid unintended weight gain.   - I encouraged the patient to switch to  unprocessed or minimally processed complex starch and increased protein intake (animal or plant source), fruits, and vegetables.   - Patient is advised to stick to a routine mealtimes to eat 3 meals  a day and avoid unnecessary snacks ( to snack only to correct hypoglycemia).  - I have approached patient with the following individualized plan to manage diabetes and patient agrees:   -She is responding well to her current medication regimen and is advised to continue Lantus 30 units SQ nightly, Metformin 1000 mg po twice daily with meals, and Trulicity 1.5 SQ weekly.  -She is encouraged to continue monitoring blood glucose at least twice daily, before meals and at bedtime and call the clinic if readings are less than 70 or greater than 200 for 3 tests in a row.  She is advised to have a snack before bed if her glucose is at or below 100 to prevent nocturnal hypoglycemia.  - Patient specific target  A1c;  LDL, HDL, Triglycerides were discussed in detail.  2)  Lipids/HPL: Her most recent lipid panel from 04/17/20 shows controlled LDL of 56.  She is advised to continue Pravastatin 20 mg po daily at bedtime.  3) Hypertension:  Her blood pressure is controlled to target.  She is advised to continue Losartan 100 mg po daily.  We have the option to reduce this dose on subsequent visits if she continues to lose weight and BP starts to drop.  4) RAI induced hypothyroidism   -She is status post ablative therapy with I-131 for hyperthyroidism on May 16, 2018.   -She does not have recent TFTs to review.  She is advised to continue current dose of Levothyroxine 25 mcg po daily before breakfast for now.  She does report some mild intermittent tremors.  Will recheck thyroid function tests prior to next visit.    - We discussed about the correct intake of her thyroid hormone, on empty stomach at fasting, with water, separated by at least 30 minutes from breakfast and other medications,  and separated by more than 4 hours from calcium, iron, multivitamins, acid reflux medications (PPIs). -Patient is made aware of the fact that thyroid hormone replacement is needed for life, dose to be adjusted by periodic monitoring of thyroid function tests.  5) Weight management:  Her Body mass index is 41.84 kg/m.-clearly complicating her diabetes care. She is a candidate for modest weight loss.  Carbs education was provided to patient-see above. -She is doing well on her current weight loss plan.  Her goal is to be down to 245 lbs at next visit in 4 months.   - I advised patient to maintain close follow up with Stephens Shire, MD for primary care needs.  - Time spent on this patient care encounter:  35 min, of which > 50% was spent in  counseling and the rest reviewing her blood glucose logs , discussing her hypoglycemia and hyperglycemia episodes, reviewing her current and  previous labs / studies  ( including abstraction from other facilities) and medications  doses and  developing a  long term treatment plan and documenting her care.   Please refer to Patient Instructions for Blood Glucose Monitoring and Insulin/Medications Dosing Guide"  in media tab for additional information. Please  also refer to " Patient Self Inventory" in the Media  tab for reviewed elements of pertinent patient history.  [name] participated in the discussions, expressed understanding, and voiced agreement with the above plans.  All questions were answered to her satisfaction. she is encouraged to contact clinic should she have any questions or concerns prior to her return visit.    Follow up plan: - Return in about 4 months (around 12/15/2020) for Diabetes follow up, Thyroid follow up, Previsit labs, Virtual visit ok.  Rayetta Pigg, [address] [facility] [address] [place], [place] [address] Phone: [phone] Fax: [phone]  08/15/2020, 11:43 AM

## 2020-08-20 ENCOUNTER — Other Ambulatory Visit: Payer: Self-pay | Admitting: "Endocrinology

## 2020-09-20 ENCOUNTER — Other Ambulatory Visit: Payer: Self-pay | Admitting: "Endocrinology

## 2020-09-20 DIAGNOSIS — Z794 Long term (current) use of insulin: Secondary | ICD-10-CM

## 2020-09-20 DIAGNOSIS — E118 Type 2 diabetes mellitus with unspecified complications: Secondary | ICD-10-CM

## 2020-10-02 ENCOUNTER — Other Ambulatory Visit: Payer: Self-pay | Admitting: "Endocrinology

## 2020-10-30 ENCOUNTER — Other Ambulatory Visit: Payer: Self-pay | Admitting: "Endocrinology

## 2020-11-06 ENCOUNTER — Other Ambulatory Visit: Payer: Self-pay | Admitting: "Endocrinology

## 2020-12-02 ENCOUNTER — Other Ambulatory Visit: Payer: Self-pay | Admitting: "Endocrinology

## 2020-12-02 DIAGNOSIS — E118 Type 2 diabetes mellitus with unspecified complications: Secondary | ICD-10-CM

## 2020-12-02 DIAGNOSIS — Z794 Long term (current) use of insulin: Secondary | ICD-10-CM

## 2020-12-12 LAB — COMPREHENSIVE METABOLIC PANEL
ALT: 17 IU/L (ref 0–32)
AST: 28 IU/L (ref 0–40)
Albumin/Globulin Ratio: 1.6 (ref 1.2–2.2)
Albumin: 4.1 g/dL (ref 3.8–4.8)
Alkaline Phosphatase: 59 IU/L (ref 44–121)
BUN/Creatinine Ratio: 26 (ref 12–28)
BUN: 23 mg/dL (ref 8–27)
Bilirubin Total: 0.3 mg/dL (ref 0.0–1.2)
CO2: 23 mmol/L (ref 20–29)
Calcium: 9.6 mg/dL (ref 8.7–10.3)
Chloride: 101 mmol/L (ref 96–106)
Creatinine, Ser: 0.88 mg/dL (ref 0.57–1.00)
GFR calc Af Amer: 81 mL/min/{1.73_m2} (ref >59)
GFR calc non Af Amer: 70 mL/min/{1.73_m2} (ref >59)
Globulin, Total: 2.6 g/dL (ref 1.5–4.5)
Glucose: 183 mg/dL — ABNORMAL HIGH (ref 65–99)
Potassium: 4.6 mmol/L (ref 3.5–5.2)
Sodium: 138 mmol/L (ref 134–144)
Total Protein: 6.7 g/dL (ref 6.0–8.5)

## 2020-12-12 LAB — HEMOGLOBIN A1C
Est. average glucose Bld gHb Est-mCnc: 148 mg/dL
Hgb A1c MFr Bld: 6.8 % — ABNORMAL HIGH (ref 4.8–5.6)

## 2020-12-12 LAB — T4, FREE: Free T4: 1.18 ng/dL (ref 0.82–1.77)

## 2020-12-12 LAB — TSH: TSH: 4.17 u[IU]/mL (ref 0.450–4.500)

## 2020-12-13 ENCOUNTER — Other Ambulatory Visit: Payer: Self-pay | Admitting: "Endocrinology

## 2020-12-16 ENCOUNTER — Ambulatory Visit: Payer: Medicare HMO | Admitting: Nurse Practitioner

## 2020-12-16 ENCOUNTER — Other Ambulatory Visit: Payer: Self-pay

## 2020-12-16 ENCOUNTER — Telehealth: Payer: Self-pay | Admitting: "Endocrinology

## 2020-12-16 ENCOUNTER — Encounter: Payer: Self-pay | Admitting: Nurse Practitioner

## 2020-12-16 VITALS — BP 120/77 | HR 95 | Ht 68.0 in | Wt 288.0 lb

## 2020-12-16 DIAGNOSIS — I10 Essential (primary) hypertension: Secondary | ICD-10-CM | POA: Diagnosis not present

## 2020-12-16 DIAGNOSIS — E890 Postprocedural hypothyroidism: Secondary | ICD-10-CM

## 2020-12-16 DIAGNOSIS — Z794 Long term (current) use of insulin: Secondary | ICD-10-CM

## 2020-12-16 DIAGNOSIS — E782 Mixed hyperlipidemia: Secondary | ICD-10-CM | POA: Diagnosis not present

## 2020-12-16 DIAGNOSIS — E559 Vitamin D deficiency, unspecified: Secondary | ICD-10-CM

## 2020-12-16 DIAGNOSIS — E118 Type 2 diabetes mellitus with unspecified complications: Secondary | ICD-10-CM

## 2020-12-16 MED ORDER — LANTUS SOLOSTAR 100 UNIT/ML ~~LOC~~ SOPN: 40.0000 [IU] | PEN_INJECTOR | Freq: Every day | SUBCUTANEOUS | 0 refills | Status: DC

## 2020-12-16 MED ORDER — GABAPENTIN 600 MG PO TABS: 600.0000 mg | ORAL_TABLET | Freq: Every day | ORAL | 3 refills | Status: DC

## 2020-12-16 NOTE — Progress Notes (Signed)
12/16/2020               Endocrinology follow-up note   Subjective:    Patient ID: [name], female    DOB: 1956/04/30.  She is returning for follow-up for management of type 2 diabetes, RAI induced hypothyroidism, hyperlipidemia.  PMD:   Stephens Shire, MD  Past Medical History:  Diagnosis Date  . Anxiety   . Coronary atherosclerosis    Cardiac CT 09/2018 with calcium score 8.7 and mild proximal LAD disease  . Essential hypertension 2009  . GERD (gastroesophageal reflux disease)   . Iron deficiency anemia   . Osteoarthritis   . Palpitations    Tachypalpitations on beta blockers since 2010  . Type 2 diabetes mellitus ([place]) 2003  . Vitamin D deficiency    Past Surgical History:  Procedure Laterality Date  . ABDOMINAL HYSTERECTOMY     History of cervical cancer  . TONSILLECTOMY     Social History   Socioeconomic History  . Marital status: Single    Spouse name: Not on file  . Number of children: 2  . Years of education: Not on file  . Highest education level: Not on file  Occupational History  . Occupation: DISABLED    Employer: UNEMPLOYED    Comment: OSTEOARTHRITIS  Tobacco Use  . Smoking status: Never Smoker  . Smokeless tobacco: Never Used  Vaping Use  . Vaping Use: Never used  Substance and Sexual Activity  . Alcohol use: No  . Drug use: No  . Sexual activity: Never  Other Topics Concern  . Not on file  Social History Narrative   Has 2 grandchildren. 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 blood glucose meter kit and supplies KIT Dispense based on patient and insurance preference. Use up to four times daily as directed. (FOR  ICD-10 E11.65)  . Cholecalciferol (VITAMIN D3) 5000 units TABS Take 1 tablet by mouth daily.   . COLLAGEN PO Take 5,000 Int'l Units/day by mouth.  . DULoxetine (CYMBALTA) 20 MG capsule Take by mouth.  . gabapentin (NEURONTIN) 400 MG capsule Take 400 mg by mouth in the morning and at bedtime.  . gabapentin (NEURONTIN) 600 MG tablet Take 1 tablet (600 mg total) by mouth at bedtime.  Marland Kitchen glucose blood (TRUE METRIX BLOOD GLUCOSE TEST) test strip TEST BLOOD SUGAR TWO TIMES DAILY  . Insulin Pen Needle (DROPLET PEN NEEDLES) 31G X 5 MM MISC USE AS INSTRUCTED TO INJECT INSULIN DAILY  . levothyroxine (SYNTHROID) 25 MCG tablet TAKE 1 TABLET EVERY DAY BEFORE BREAKFAST  . losartan (COZAAR) 100 MG tablet Take 100 mg by mouth daily.  . magnesium oxide (MAG-OX) 400 MG tablet Take 400 mg by mouth 4 (four) times daily.   . meloxicam (MOBIC) 15 MG tablet Take 15 mg by mouth at bedtime.  . metFORMIN (GLUCOPHAGE) 1000 MG tablet TAKE 1 TABLET TWICE DAILY  . pantoprazole (PROTONIX) 40 MG tablet Take 40 mg by mouth daily.    . pravastatin (PRAVACHOL) 20 MG tablet Take 20 mg by mouth daily.  . TRULICITY 1.5 YH/0.6CB SOPN INJECT 1.5MG (1 PEN) SUBCUTANEOUSLY EVERY WEEK  . [  DISCONTINUED] DULoxetine (CYMBALTA) 60 MG capsule Take 60 mg by mouth daily. Weaning off currently on 1 a day for 2 weeks then will go to QOD  . [DISCONTINUED] LANTUS SOLOSTAR 100 UNIT/ML Solostar Pen INJECT 30 UNITS INTO THE SKIN AT BEDTIME.  . insulin glargine (LANTUS SOLOSTAR) 100 UNIT/ML Solostar Pen Inject 40 Units into the skin at bedtime.   No facility-administered encounter medications on file as of 12/16/2020.   ALLERGIES: Allergies  Allergen Reactions  . Contrast Media [Iodinated Diagnostic Agents]   . Sulfa Antibiotics Nausea And Vomiting    Other reaction(s): GI Upset (intolerance) unknown  . Doxepin Rash and Swelling   VACCINATION STATUS: Immunization History  Administered Date(s) Administered  . Tdap 11/24/2007     Diabetes She presents for her follow-up diabetic visit. She has type 2 diabetes mellitus. Onset time: She was diagnosed at approximate age of 82 years. Her disease course has been stable. Pertinent negatives for hypoglycemia include no confusion, nervousness/anxiousness, pallor, seizures or tremors. Pertinent negatives for diabetes include no fatigue, no polydipsia, no polyphagia, no polyuria and no weight loss. There are no hypoglycemic complications. Symptoms are stable. Diabetic complications include heart disease and peripheral neuropathy. Risk factors for coronary artery disease include diabetes mellitus, dyslipidemia, hypertension, obesity, family history, sedentary lifestyle and post-menopausal. Current diabetic treatment includes oral agent (monotherapy) and insulin injections. She is compliant with treatment all of the time. Her weight is increasing steadily. She is following a generally healthy (started new diet Optiva) diet. When asked about meal planning, she reported none. She has not had a previous visit with a dietitian. She participates in exercise intermittently. Her home blood glucose trend is decreasing steadily. Her breakfast blood glucose range is generally 140-180 mg/dl. (She presents with her meter and logs showing slightly above target fasting and near target postprandial glycemic profile.  Her previsit A1c was 6.8%, increasing from last visit of 5.8%.  She has gained back approximately 13 lbs that she had lost due to "stress eating" because she just had the anniversary of the loss of her father.  There is no documented or reported episodes of hypoglycemia.  Analysis of her meter shows 7-day average of 174, 14-day average of 171, 30-day average of 163, and 90-day average of 143.  She says she is still trying to do the Optiva diet and expects to be back on track by next visit.) An ACE inhibitor/angiotensin II receptor blocker is being taken. She does not see a podiatrist.Eye exam is  current.  Hyperlipidemia This is a chronic problem. The current episode started more than 1 year ago. The problem is controlled. Recent lipid tests were reviewed and are normal. Exacerbating diseases include chronic renal disease, diabetes, hypothyroidism and obesity. There are no known factors aggravating her hyperlipidemia. Pertinent negatives include no myalgias. Current antihyperlipidemic treatment includes statins and exercise. The current treatment provides moderate improvement of lipids. There are no compliance problems.  Risk factors for coronary artery disease include dyslipidemia, diabetes mellitus, hypertension, obesity, a sedentary lifestyle, family history and post-menopausal.  Thyroid Problem Presents for follow-up visit. Patient reports no anxiety, cold intolerance, constipation, depressed mood, diarrhea, fatigue, heat intolerance, tremors or weight loss. The symptoms have been stable (She was given I-131 therapy for hyperthyroidism on May 16, 2018.). Her past medical history is significant for diabetes and hyperlipidemia.    Review of systems  Constitutional: + gained 13 lbs since last visit,  current Body mass index is 43.79 kg/m. , no fatigue, no subjective hyperthermia, no  subjective hypothermia Eyes: no blurry vision, no xerophthalmia ENT: no sore throat, no nodules palpated in throat, no dysphagia/odynophagia, no hoarseness Cardiovascular: no chest pain, no shortness of breath, no palpitations, no leg swelling Respiratory: no cough, no shortness of breath Gastrointestinal: no nausea/vomiting/diarrhea Musculoskeletal: no muscle/joint aches Skin: no rashes, no hyperemia Neurological: no tremors, no dizziness, + numbness/tingling to BLE with pins and needles in toes (worse at night) Psychiatric: no depression, no anxiety   Objective:    BP 120/77 (BP Location: Right Arm)   Pulse 95   Ht _0  (1.727 m)   Wt 288 lb (130.6 kg)   BMI 43.79 kg/m   Wt Readings from Last 3  Encounters:  12/16/20 288 lb (130.6 kg)  08/15/20 275 lb 3.2 oz (124.8 kg)  05/15/20 286 lb 9.6 oz (130 kg)    BP Readings from Last 3 Encounters:  12/16/20 120/77  08/15/20 111/76  05/15/20 133/79    Physical Exam- Limited  Constitutional:  Body mass index is 43.79 kg/m. , not in acute distress, normal state of mind Eyes:  EOMI, no exophthalmos Neck: Supple Cardiovascular: RRR, no murmers, rubs, or gallops, no edema Respiratory: Adequate breathing efforts, no crackles, rales, rhonchi, or wheezing Musculoskeletal: no gross deformities, strength intact in all four extremities, no gross restriction of joint movements Skin:  no rashes, no hyperemia Neurological: no tremor with outstretched hands    Recent Results (from the past 2160 hour(s))  TSH     Status: None   Collection Time: 12/11/20  9:08 AM  Result Value Ref Range   TSH 4.170 0.450 - 4.500 uIU/mL  T4, free     Status: None   Collection Time: 12/11/20  9:08 AM  Result Value Ref Range   Free T4 1.18 0.82 - 1.77 ng/dL  Comprehensive metabolic panel     Status: Abnormal   Collection Time: 12/11/20  9:08 AM  Result Value Ref Range   Glucose 183 (H) 65 - 99 mg/dL   BUN 23 8 - 27 mg/dL   Creatinine, Ser 0.88 0.57 - 1.00 mg/dL   GFR calc non Af Amer 70 >59 mL/min/1.73   GFR calc Af Amer 81 >59 mL/min/1.73    Comment: **In accordance with recommendations from the NKF-ASN Task force,**   [facility] is in the process of updating its eGFR calculation to the   2021 CKD-EPI creatinine equation that estimates kidney function   without a race variable.    BUN/Creatinine Ratio 26 12 - 28   Sodium 138 134 - 144 mmol/L   Potassium 4.6 3.5 - 5.2 mmol/L   Chloride 101 96 - 106 mmol/L   CO2 23 20 - 29 mmol/L   Calcium 9.6 8.7 - 10.3 mg/dL   Total Protein 6.7 6.0 - 8.5 g/dL   Albumin 4.1 3.8 - 4.8 g/dL   Globulin, Total 2.6 1.5 - 4.5 g/dL   Albumin/Globulin Ratio 1.6 1.2 - 2.2   Bilirubin Total 0.3 0.0 - 1.2 mg/dL   Alkaline  Phosphatase 59 44 - 121 IU/L   AST 28 0 - 40 IU/L   ALT 17 0 - 32 IU/L  Hemoglobin A1c     Status: Abnormal   Collection Time: 12/11/20  9:08 AM  Result Value Ref Range   Hgb A1c MFr Bld 6.8 (H) 4.8 - 5.6 %    Comment:          Prediabetes: 5.7 - 6.4          Diabetes: >6.4  Glycemic control for adults with diabetes: <7.0    Est. average glucose Bld gHb Est-mCnc 148 mg/dL    Lipid Panel     Component Value Date/Time   CHOL 106 04/17/2020 0803   TRIG 79 04/17/2020 0803   HDL 34 (L) 04/17/2020 0803   CHOLHDL 3.1 04/17/2020 0803   LDLCALC 56 04/17/2020 0803     Assessment & Plan:   1) Type 2 diabetes mellitus with complication, with long-term current use of insulin ([place])  - Patient has currently controlled type 2 DM since 65 years of age.  She presents with her meter and logs showing slightly above target fasting and near target postprandial glycemic profile.  Her previsit A1c was 6.8%, increasing from last visit of 5.8%.  She has gained back approximately 13 lbs that she had lost due to "stress eating" because she just had the anniversary of the loss of her father.  There is no documented or reported episodes of hypoglycemia.  Analysis of her meter shows 7-day average of 174, 14-day average of 171, 30-day average of 163, and 90-day average of 143.  She says she is still trying to do the Optiva diet and expects to be back on track by next visit.   Recent labs reviewed.   Her diabetes is complicated by obesity/sedentary life, peripheral neuropathy and patient remains at a high risk for more acute and chronic complications which include CAD, CVA, CKD, retinopathy, and neuropathy. These are all discussed in detail with the patient.  - Nutritional counseling repeated at each appointment due to patients tendency to fall back in to old habits.  - The patient admits there is a room for improvement in their diet and drink choices. -  Suggestion is made for the patient to avoid  simple carbohydrates from their diet including Cakes, Sweet Desserts / Pastries, Ice Cream, Soda (diet and regular), Sweet Tea, Candies, Chips, Cookies, Sweet Pastries,  Store Bought Juices, Alcohol in Excess of  1-2 drinks a day, Artificial Sweeteners, Coffee Creamer, and "Sugar-free" Products. This will help patient to have stable blood glucose profile and potentially avoid unintended weight gain.   - I encouraged the patient to switch to  unprocessed or minimally processed complex starch and increased protein intake (animal or plant source), fruits, and vegetables.   - Patient is advised to stick to a routine mealtimes to eat 3 meals  a day and avoid unnecessary snacks ( to snack only to correct hypoglycemia).  - I have approached patient with the following individualized plan to manage diabetes and patient agrees:   -Given her fasting hyperglycemia, she will tolerate slight increase of her Lantus to 40 units SQ nightly.  She can continue her Metformin 1000 mg po twice daily with meals and Trulicity 1.5 mg SQ weekly.   -She is encouraged to continue monitoring blood glucose at least twice daily, before meals and at bedtime and call the clinic if readings are less than 70 or greater than 200 for 3 tests in a row.    I have increased her nighttime dose of Gabapentin to 600 mg due to worsening neuropathy at night.  - Patient specific target  A1c;  LDL, HDL, Triglycerides were discussed in detail.  2) Lipids/HPL: Her most recent lipid panel from 04/17/20 shows controlled LDL of 56.  She is advised to continue Pravastatin 20 mg po daily at bedtime.  Side effects and precautions discussed with her.  3) Hypertension:  Her blood pressure is controlled to target.  She is advised to continue Losartan 100 mg po daily.   4) RAI induced hypothyroidism   -She is status post ablative therapy with I-131 for hyperthyroidism on May 16, 2018.    -Her recent thyroid function tests are consistent with  appropriate hormone replacement.  She is advised to continue Levothyroxine 25 mcg po daily before breakfast.     - We discussed about the correct intake of her thyroid hormone, on empty stomach at fasting, with water, separated by at least 30 minutes from breakfast and other medications,  and separated by more than 4 hours from calcium, iron, multivitamins, acid reflux medications (PPIs). -Patient is made aware of the fact that thyroid hormone replacement is needed for life, dose to be adjusted by periodic monitoring of thyroid function tests.  5) Weight management:  Her Body mass index is 43.79 kg/m.-clearly complicating her diabetes care. She is a candidate for modest weight loss.  Carbs education was provided to patient-see above. -She is doing well on her current weight loss plan.  Her goal is to be down to 245 lbs at next visit in 4 months.   - I advised patient to maintain close follow up with Stephens Shire, MD for primary care needs.  - Time spent on this patient care encounter:  35 min, of which > 50% was spent in  counseling and the rest reviewing her blood glucose logs , discussing her hypoglycemia and hyperglycemia episodes, reviewing her current and  previous labs / studies  ( including abstraction from other facilities) and medications  doses and developing a  long term treatment plan and documenting her care.   Please refer to Patient Instructions for Blood Glucose Monitoring and Insulin/Medications Dosing Guide"  in media tab for additional information. Please  also refer to " Patient Self Inventory" in the Media  tab for reviewed elements of pertinent patient history.  [name] participated in the discussions, expressed understanding, and voiced agreement with the above plans.  All questions were answered to her satisfaction. she is encouraged to contact clinic should she have any questions or concerns prior to her return visit.    Follow up plan: - Return in about 4  months (around 04/15/2021) for Diabetes follow up with A1c in office, Thyroid follow up, Previsit labs, ABI next visit.  [name], [facility] [address] [place], [place] [address] Phone: [phone] Fax: [phone]  12/16/2020, 11:59 AM

## 2020-12-16 NOTE — Telephone Encounter (Signed)
Rx refill sent.

## 2020-12-16 NOTE — Patient Instructions (Signed)

## 2020-12-16 NOTE — Telephone Encounter (Signed)
Pt was seen this morning and forgot to let the provider know she needs a refill on the following.  levothyroxine (SYNTHROID) 25 MCG tablet  [place], [place] Phone:  [phone]  Fax:  [phone]

## 2021-01-15 ENCOUNTER — Telehealth: Payer: Self-pay

## 2021-01-15 MED ORDER — PREGABALIN 75 MG PO CAPS: 75.0000 mg | ORAL_CAPSULE | Freq: Two times a day (BID) | ORAL | 0 refills | Status: DC

## 2021-01-15 NOTE — Telephone Encounter (Signed)
Patient stated that she had c/o of neuropathy in feet and legs at last office visit and you had increased her hs dose of Gabapentin, stated that it worked for about a week and now the pain is unbearable, Saturday evening she had only 2 hours of sleep due to this, stated that she had a left over of Norco from oral surgery and took it on Sunday night  but that puts her down for a couple of days after. She is willing to do whatever it takes to figure this out , testing or anything. Please advise.

## 2021-01-15 NOTE — Telephone Encounter (Signed)
Lets stop the Gabapentin and switch to Lyrica 75 mg po BID, if she is willing.  Maybe she will get more relief with the Lyrica.

## 2021-01-15 NOTE — Telephone Encounter (Signed)
Patient is willing to try it, she is requesting it be sent in to [facility]

## 2021-01-15 NOTE — Telephone Encounter (Signed)
Pt requesting a call back. 

## 2021-01-20 ENCOUNTER — Telehealth: Payer: Self-pay | Admitting: Nurse Practitioner

## 2021-01-20 ENCOUNTER — Other Ambulatory Visit: Payer: Self-pay | Admitting: "Endocrinology

## 2021-01-20 NOTE — Telephone Encounter (Signed)
Returned call to patient and advised, verbalized understanding 

## 2021-01-20 NOTE — Telephone Encounter (Signed)
I am sorry.  I am not willing to prescribe Norco due to the regulations put in place to prescribe such a drug.

## 2021-01-20 NOTE — Telephone Encounter (Signed)
I called patient back and advised, Verbalized understanding, any suggestions as to what she can take OTC? Tylenol, Advil or Aleve?

## 2021-01-20 NOTE — Telephone Encounter (Signed)
I returned call to patient she advised that the insurance company is dragging there feet on approving the Lyrica, we have yet to get anything in regards to needing a prior auth, Patient was wondering if you could send in a script for norco as she stated that is helping in interm , she is taking one a day that her dentist had prescribed for her oral surgery. If so she would like it sent to [facility].

## 2021-01-20 NOTE — Telephone Encounter (Signed)
She can try advil or aleve for now, although because it is nerve pain, I doubt it will help much.  Hopefully she'll get approval for Lyrica soon

## 2021-01-20 NOTE — Telephone Encounter (Signed)
Pt requesting a call back in regards to the leg and feet pain she is having

## 2021-02-10 ENCOUNTER — Telehealth: Payer: Self-pay | Admitting: Nurse Practitioner

## 2021-02-10 NOTE — Telephone Encounter (Signed)
Pt is calling and states that she feels her Lyrica needs to be increased as it works during the day but at night time it is keeping her up all night and she is getting maybe 2 hours of sleep  [phone]

## 2021-02-10 NOTE — Telephone Encounter (Signed)
Please advise 

## 2021-02-11 MED ORDER — PREGABALIN 100 MG PO CAPS: 100.0000 mg | ORAL_CAPSULE | Freq: Two times a day (BID) | ORAL | 2 refills | Status: DC

## 2021-02-11 NOTE — Telephone Encounter (Signed)
Patient is requesting it go to [facility] mail order please.

## 2021-02-14 ENCOUNTER — Other Ambulatory Visit: Payer: Self-pay | Admitting: Nurse Practitioner

## 2021-02-14 DIAGNOSIS — E118 Type 2 diabetes mellitus with unspecified complications: Secondary | ICD-10-CM

## 2021-02-20 ENCOUNTER — Other Ambulatory Visit: Payer: Self-pay | Admitting: Nurse Practitioner

## 2021-03-14 ENCOUNTER — Other Ambulatory Visit: Payer: Self-pay | Admitting: Nurse Practitioner

## 2021-03-26 ENCOUNTER — Other Ambulatory Visit: Payer: Self-pay | Admitting: Nurse Practitioner

## 2021-04-08 LAB — COMPREHENSIVE METABOLIC PANEL
ALT: 14 IU/L (ref 0–32)
AST: 24 IU/L (ref 0–40)
Albumin/Globulin Ratio: 1.6 (ref 1.2–2.2)
Albumin: 4 g/dL (ref 3.8–4.8)
Alkaline Phosphatase: 68 IU/L (ref 44–121)
BUN/Creatinine Ratio: 14 (ref 12–28)
BUN: 12 mg/dL (ref 8–27)
Bilirubin Total: 0.4 mg/dL (ref 0.0–1.2)
CO2: 23 mmol/L (ref 20–29)
Calcium: 9.3 mg/dL (ref 8.7–10.3)
Chloride: 104 mmol/L (ref 96–106)
Creatinine, Ser: 0.84 mg/dL (ref 0.57–1.00)
Globulin, Total: 2.5 g/dL (ref 1.5–4.5)
Glucose: 159 mg/dL — ABNORMAL HIGH (ref 65–99)
Potassium: 4.7 mmol/L (ref 3.5–5.2)
Sodium: 142 mmol/L (ref 134–144)
Total Protein: 6.5 g/dL (ref 6.0–8.5)
eGFR: 78 mL/min/{1.73_m2} (ref >59)

## 2021-04-08 LAB — T4, FREE: Free T4: 1.17 ng/dL (ref 0.82–1.77)

## 2021-04-08 LAB — TSH: TSH: 4.61 u[IU]/mL — ABNORMAL HIGH (ref 0.450–4.500)

## 2021-04-15 ENCOUNTER — Encounter: Payer: Self-pay | Admitting: Nurse Practitioner

## 2021-04-15 ENCOUNTER — Other Ambulatory Visit: Payer: Self-pay

## 2021-04-15 ENCOUNTER — Ambulatory Visit: Payer: Medicare HMO | Admitting: Nurse Practitioner

## 2021-04-15 VITALS — BP 143/76 | HR 104 | Ht 68.0 in | Wt 309.0 lb

## 2021-04-15 DIAGNOSIS — I10 Essential (primary) hypertension: Secondary | ICD-10-CM | POA: Diagnosis not present

## 2021-04-15 DIAGNOSIS — E890 Postprocedural hypothyroidism: Secondary | ICD-10-CM | POA: Diagnosis not present

## 2021-04-15 DIAGNOSIS — E559 Vitamin D deficiency, unspecified: Secondary | ICD-10-CM

## 2021-04-15 DIAGNOSIS — E782 Mixed hyperlipidemia: Secondary | ICD-10-CM | POA: Diagnosis not present

## 2021-04-15 DIAGNOSIS — E118 Type 2 diabetes mellitus with unspecified complications: Secondary | ICD-10-CM | POA: Diagnosis not present

## 2021-04-15 DIAGNOSIS — Z794 Long term (current) use of insulin: Secondary | ICD-10-CM | POA: Diagnosis not present

## 2021-04-15 LAB — POCT UA - MICROALBUMIN
Albumin/Creatinine Ratio, Urine, POC: <30
Creatinine, POC: 200 mg/dL
Microalbumin Ur, POC: 10 mg/L

## 2021-04-15 LAB — POCT GLYCOSYLATED HEMOGLOBIN (HGB A1C): Hemoglobin A1C: 7.4 % — AB (ref 4.0–5.6)

## 2021-04-15 MED ORDER — LANTUS SOLOSTAR 100 UNIT/ML ~~LOC~~ SOPN: 55.0000 [IU] | PEN_INJECTOR | Freq: Every day | SUBCUTANEOUS | 1 refills | Status: DC

## 2021-04-15 MED ORDER — LEVOTHYROXINE SODIUM 50 MCG PO TABS: 50.0000 ug | ORAL_TABLET | Freq: Every day | ORAL | 3 refills | Status: DC

## 2021-04-15 NOTE — Progress Notes (Signed)
04/15/2021               Endocrinology follow-up note   Subjective:    Patient ID: [name], female    DOB: [birth year].  She is returning for follow-up for management of type 2 diabetes, RAI induced hypothyroidism, hyperlipidemia.  PMD:   Stephens Shire, MD  Past Medical History:  Diagnosis Date  . Anxiety   . Coronary atherosclerosis    Cardiac CT 09/2018 with calcium score 8.7 and mild proximal LAD disease  . Essential hypertension 2009  . GERD (gastroesophageal reflux disease)   . Iron deficiency anemia   . Osteoarthritis   . Palpitations    Tachypalpitations on beta blockers since 2010  . Type 2 diabetes mellitus ([place]) 2003  . Vitamin D deficiency    Past Surgical History:  Procedure Laterality Date  . ABDOMINAL HYSTERECTOMY     History of cervical cancer  . TONSILLECTOMY     Social History   Socioeconomic History  . Marital status: Single    Spouse name: Not on file  . Number of children: 2  . Years of education: Not on file  . Highest education level: Not on file  Occupational History  . Occupation: DISABLED    Employer: UNEMPLOYED    Comment: OSTEOARTHRITIS  Tobacco Use  . Smoking status: Never Smoker  . Smokeless tobacco: Never Used  Vaping Use  . Vaping Use: Never used  Substance and Sexual Activity  . Alcohol use: No  . Drug use: No  . Sexual activity: Never  Other Topics Concern  . Not on file  Social History Narrative   Has 2 grandchildren. 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 blood glucose meter kit and supplies KIT Dispense based on patient and insurance preference. Use up to four times daily as directed. (FOR  ICD-10 E11.65)  . Cholecalciferol (VITAMIN D3) 5000 units TABS Take 1 tablet by mouth daily.   . COLLAGEN PO Take 5,000 Int'l Units/day by mouth.  Marland Kitchen glucose blood (TRUE METRIX BLOOD GLUCOSE TEST) test strip TEST BLOOD SUGAR TWO TIMES DAILY  . insulin glargine (LANTUS SOLOSTAR) 100 UNIT/ML Solostar Pen Inject 55 Units into the skin at bedtime.  . Insulin Pen Needle (DROPLET PEN NEEDLES) 31G X 5 MM MISC USE AS INSTRUCTED TO INJECT INSULIN DAILY  . levothyroxine (SYNTHROID) 50 MCG tablet Take 1 tablet (50 mcg total) by mouth daily before breakfast.  . losartan (COZAAR) 100 MG tablet Take 100 mg by mouth daily.  . magnesium oxide (MAG-OX) 400 MG tablet Take 400 mg by mouth 4 (four) times daily.   . meloxicam (MOBIC) 15 MG tablet Take 15 mg by mouth at bedtime.  . metFORMIN (GLUCOPHAGE) 1000 MG tablet TAKE 1 TABLET TWICE DAILY  . pantoprazole (PROTONIX) 40 MG tablet Take 40 mg by mouth daily.    . pravastatin (PRAVACHOL) 20 MG tablet Take 20 mg by mouth daily.  . pregabalin (LYRICA) 100 MG capsule Take 1 capsule (100 mg total) by mouth 2 (two) times daily.  . TRULICITY 1.5 TF/5.7DU SOPN INJECT 1.5MG (1 PEN) SUBCUTANEOUSLY EVERY WEEK  . [DISCONTINUED] DULoxetine (CYMBALTA) 20  MG capsule Take by mouth.  . [DISCONTINUED] LANTUS SOLOSTAR 100 UNIT/ML Solostar Pen INJECT 40 UNITS INTO THE SKIN AT BEDTIME.  . [DISCONTINUED] levothyroxine (SYNTHROID) 25 MCG tablet TAKE 1 TABLET EVERY DAY BEFORE BREAKFAST   No facility-administered encounter medications on file as of 04/15/2021.   ALLERGIES: Allergies  Allergen Reactions  . Contrast Media [Iodinated Diagnostic Agents]   . Sulfa Antibiotics Nausea And Vomiting    Other reaction(s): GI Upset (intolerance) unknown  . Doxepin Rash and Swelling   VACCINATION STATUS: Immunization History  Administered Date(s) Administered  . Tdap 11/24/2007    Diabetes She presents for her follow-up diabetic visit. She has type 2 diabetes mellitus. Onset time: She  was diagnosed at approximate age of 63 years. Her disease course has been worsening. Hypoglycemia symptoms include nervousness/anxiousness. Pertinent negatives for hypoglycemia include no confusion, pallor, seizures or tremors. Associated symptoms include fatigue. Pertinent negatives for diabetes include no polydipsia, no polyphagia, no polyuria and no weight loss. There are no hypoglycemic complications. Symptoms are stable. Diabetic complications include heart disease, nephropathy and peripheral neuropathy. Risk factors for coronary artery disease include diabetes mellitus, dyslipidemia, hypertension, obesity, family history, sedentary lifestyle and post-menopausal. Current diabetic treatment includes insulin injections and oral agent (monotherapy). She is compliant with treatment all of the time. Her weight is increasing rapidly. She is following a generally unhealthy diet. When asked about meal planning, she reported none. She has not had a previous visit with a dietitian. She participates in exercise intermittently. Her home blood glucose trend is increasing steadily. Her breakfast blood glucose range is generally 180-200 mg/dl. Her overall blood glucose range is 180-200 mg/dl. (She presents today with her meter and logs showing worsening glycemic profile overall.  Her POCT A1c today is 7.4%, worsening from previous visit of 6.8%.  She has stopped her Optiva diet and gained back 21 lbs since January.  She does report significant amount of stress since her daughter returned home to live with her.  There are no episodes of hypoglycemia documented.  Analysis of her meter shows 7-day average of 198, 14-day average of 176, 30-day average of 166 and 90 day average of 153.) An ACE inhibitor/angiotensin II receptor blocker is being taken. She does not see a podiatrist.Eye exam is current.  Hyperlipidemia This is a chronic problem. The current episode started more than 1 year ago. The problem is controlled. Recent  lipid tests were reviewed and are normal. Exacerbating diseases include chronic renal disease, diabetes, hypothyroidism and obesity. There are no known factors aggravating her hyperlipidemia. Pertinent negatives include no myalgias. Current antihyperlipidemic treatment includes statins and exercise. The current treatment provides moderate improvement of lipids. There are no compliance problems.  Risk factors for coronary artery disease include dyslipidemia, diabetes mellitus, hypertension, obesity, a sedentary lifestyle, family history and post-menopausal.  Thyroid Problem Presents for follow-up visit. Symptoms include anxiety, depressed mood, fatigue, hair loss and weight gain. Patient reports no cold intolerance, constipation, diarrhea, heat intolerance, tremors or weight loss. The symptoms have been stable (She was given I-131 therapy for hyperthyroidism on May 16, 2018.). Her past medical history is significant for diabetes and hyperlipidemia.    Review of systems  Constitutional: + recent rapid weight gain,  current Body mass index is 46.98 kg/m. , + fatigue, no subjective hyperthermia, no subjective hypothermia Eyes: no blurry vision, no xerophthalmia ENT: no sore throat, no nodules palpated in throat, no dysphagia/odynophagia, no hoarseness Cardiovascular: no chest pain, no shortness of breath, no palpitations, no leg swelling  Respiratory: no cough, no shortness of breath Gastrointestinal: no nausea/vomiting/diarrhea Musculoskeletal: no muscle/joint aches Skin: no rashes, no hyperemia, + hair loss Neurological: no tremors, no dizziness, + numbness/tingling to BLE with pins and needles in toes (worse at night) Psychiatric: + depression/anxiety associated with increased stress   Objective:    BP (!) 143/76   Pulse (!) 104   Ht '5\' 8"'  (1.727 m)   Wt (!) 309 lb (140.2 kg)   BMI 46.98 kg/m   Wt Readings from Last 3 Encounters:  04/15/21 (!) 309 lb (140.2 kg)  12/16/20 288 lb (130.6  kg)  08/15/20 275 lb 3.2 oz (124.8 kg)    BP Readings from Last 3 Encounters:  04/15/21 (!) 143/76  12/16/20 120/77  08/15/20 111/76     Physical Exam- Limited  Constitutional:  Body mass index is 46.98 kg/m. , not in acute distress, normal state of mind Eyes:  EOMI, no exophthalmos Neck: Supple Cardiovascular: RRR, no murmurs, rubs, or gallops, no edema Respiratory: Adequate breathing efforts, no crackles, rales, rhonchi, or wheezing Musculoskeletal: no gross deformities, strength intact in all four extremities, no gross restriction of joint movements Skin:  no rashes, no hyperemia Neurological: no tremor with outstretched hands   Foot exam:   No rashes, ulcers, cuts, calluses, onychodystrophy. Mild deformity of great toe bilaterally (facing inward)  Good pulses bilat.  No sensation to 10 g monofilament bilat.   POCT ABI Results 04/15/21   Right ABI:  1.15      Left ABI:  1.20  Right leg systolic / diastolic: 131/43 mmHg Left leg systolic / diastolic: 888/75 mmHg  Arm systolic / diastolic: 797/28 mmHG  Detailed report will be scanned into patient chart.    Recent Results (from the past 2160 hour(s))  TSH     Status: Abnormal   Collection Time: 04/07/21  8:05 AM  Result Value Ref Range   TSH 4.610 (H) 0.450 - 4.500 uIU/mL  T4, free     Status: None   Collection Time: 04/07/21  8:05 AM  Result Value Ref Range   Free T4 1.17 0.82 - 1.77 ng/dL  Comprehensive metabolic panel     Status: Abnormal   Collection Time: 04/07/21  8:05 AM  Result Value Ref Range   Glucose 159 (H) 65 - 99 mg/dL   BUN 12 8 - 27 mg/dL   Creatinine, Ser 0.84 0.57 - 1.00 mg/dL   eGFR 78 >59 mL/min/1.73   BUN/Creatinine Ratio 14 12 - 28   Sodium 142 134 - 144 mmol/L   Potassium 4.7 3.5 - 5.2 mmol/L   Chloride 104 96 - 106 mmol/L   CO2 23 20 - 29 mmol/L   Calcium 9.3 8.7 - 10.3 mg/dL   Total Protein 6.5 6.0 - 8.5 g/dL   Albumin 4.0 3.8 - 4.8 g/dL   Globulin, Total 2.5 1.5 - 4.5 g/dL    Albumin/Globulin Ratio 1.6 1.2 - 2.2   Bilirubin Total 0.4 0.0 - 1.2 mg/dL   Alkaline Phosphatase 68 44 - 121 IU/L   AST 24 0 - 40 IU/L   ALT 14 0 - 32 IU/L  HgB A1c     Status: Abnormal   Collection Time: 04/15/21 10:16 AM  Result Value Ref Range   Hemoglobin A1C 7.4 (A) 4.0 - 5.6 %   HbA1c POC (<> result, manual entry)     HbA1c, POC (prediabetic range)     HbA1c, POC (controlled diabetic range)    POCT UA - Microalbumin  Status: Normal   Collection Time: 04/15/21 10:16 AM  Result Value Ref Range   Microalbumin Ur, POC 10 mg/L   Creatinine, POC 200 mg/dL   Albumin/Creatinine Ratio, Urine, POC <30     Lipid Panel     Component Value Date/Time   CHOL 106 04/17/2020 0803   TRIG 79 04/17/2020 0803   HDL 34 (L) 04/17/2020 0803   CHOLHDL 3.1 04/17/2020 0803   LDLCALC 56 04/17/2020 0803     Assessment & Plan:   1) Type 2 diabetes mellitus with complication, with long-term current use of insulin ([place])  - Patient has currently controlled type 2 DM since 65 years of age.  She presents today with her meter and logs showing worsening glycemic profile overall.  Her POCT A1c today is 7.4%, worsening from previous visit of 6.8%.  She has stopped her Optiva diet and gained back 21 lbs since January.  She does report significant amount of stress since her daughter returned home to live with her.  There are no episodes of hypoglycemia documented.  Analysis of her meter shows 7-day average of 198, 14-day average of 176, 30-day average of 166 and 90 day average of 153.   Recent labs reviewed.   Her diabetes is complicated by obesity/sedentary life, peripheral neuropathy and patient remains at a high risk for more acute and chronic complications which include CAD, CVA, CKD, retinopathy, and neuropathy. These are all discussed in detail with the patient.  - Nutritional counseling repeated at each appointment due to patients tendency to fall back in to old habits.  - The patient admits  there is a room for improvement in their diet and drink choices. -  Suggestion is made for the patient to avoid simple carbohydrates from their diet including Cakes, Sweet Desserts / Pastries, Ice Cream, Soda (diet and regular), Sweet Tea, Candies, Chips, Cookies, Sweet Pastries, Store Bought Juices, Alcohol in Excess of 1-2 drinks a day, Artificial Sweeteners, Coffee Creamer, and "Sugar-free" Products. This will help patient to have stable blood glucose profile and potentially avoid unintended weight gain.   - I encouraged the patient to switch to unprocessed or minimally processed complex starch and increased protein intake (animal or plant source), fruits, and vegetables.   - Patient is advised to stick to a routine mealtimes to eat 3 meals a day and avoid unnecessary snacks (to snack only to correct hypoglycemia).  - I have approached patient with the following individualized plan to manage diabetes and patient agrees:   -Given her fasting hyperglycemia, she will tolerate increase of her Lantus to 55 units SQ nightly.  She can continue her Metformin 1000 mg po twice daily with meals and Trulicity 1.5 mg SQ weekly.   -She is encouraged to continue monitoring blood glucose at least twice daily, before meals and at bedtime and call the clinic if readings are less than 70 or greater than 200 for 3 tests in a row.    She was switched to Lyrica between visits for better control of her neuropathy.  - Patient specific target  A1c;  LDL, HDL, Triglycerides were discussed in detail.  2) Lipids/HPL: Her most recent lipid panel from 04/17/20 shows controlled LDL of 56.  She is advised to continue Pravastatin 20 mg po daily at bedtime.  Side effects and precautions discussed with her.  3) Hypertension:  Her blood pressure is controlled to target.  She is advised to continue Losartan 100 mg po daily.    4) RAI induced hypothyroidism   -  She is status post ablative therapy with I-131 for hyperthyroidism  on May 16, 2018.    -Her previsit thyroid function tests are consistent with slight under-replacement.  She is advised to increase her dose of Levothyroxine to 50 mcg po daily before breakfast.      - We discussed about the correct intake of her thyroid hormone, on empty stomach at fasting, with water, separated by at least 30 minutes from breakfast and other medications,  and separated by more than 4 hours from calcium, iron, multivitamins, acid reflux medications (PPIs). -Patient is made aware of the fact that thyroid hormone replacement is needed for life, dose to be adjusted by periodic monitoring of thyroid function tests.  5) Weight management:  Her Body mass index is 46.98 kg/m.-clearly complicating her diabetes care. She is a candidate for modest weight loss.  Carbs education was provided to patient-see above. She has recently gained back the weight she previously lost with the Optiva diet.  We discussed bariatric surgery in great detail today and she will be reaching out to get more information.  - I advised patient to maintain close follow up with Stephens Shire, MD for primary care needs.    I spent 44 minutes in the care of the patient today including review of labs from [facility], Lipids, Thyroid Function, Hematology (current and previous including abstractions from other facilities); face-to-face time discussing  her blood glucose readings/logs, discussing hypoglycemia and hyperglycemia episodes and symptoms, medications doses, her options of short and long term treatment based on the latest standards of care / guidelines;  discussion about incorporating lifestyle medicine;  and documenting the encounter.    Please refer to Patient Instructions for Blood Glucose Monitoring and Insulin/Medications Dosing Guide"  in media tab for additional information. Please  also refer to " Patient Self Inventory" in the Media  tab for reviewed elements of pertinent patient history.  [name]  participated in the discussions, expressed understanding, and voiced agreement with the above plans.  All questions were answered to her satisfaction. she is encouraged to contact clinic should she have any questions or concerns prior to her return visit.    Follow up plan: - Return in about 3 months (around 07/16/2021) for Diabetes F/U with A1c in office, Bring meter and logs, Previsit labs, Thyroid follow up.  [name], [facility] [address] [place], [place] [address] Phone: [phone] Fax: [phone]  04/15/2021, 10:56 AM

## 2021-04-15 NOTE — Patient Instructions (Signed)

## 2021-04-24 ENCOUNTER — Other Ambulatory Visit: Payer: Self-pay | Admitting: Nurse Practitioner

## 2021-04-24 DIAGNOSIS — Z794 Long term (current) use of insulin: Secondary | ICD-10-CM

## 2021-05-03 ENCOUNTER — Other Ambulatory Visit: Payer: Self-pay | Admitting: "Endocrinology

## 2021-05-03 DIAGNOSIS — Z794 Long term (current) use of insulin: Secondary | ICD-10-CM

## 2021-05-08 ENCOUNTER — Other Ambulatory Visit: Payer: Self-pay | Admitting: Nurse Practitioner

## 2021-06-09 ENCOUNTER — Other Ambulatory Visit: Payer: Self-pay

## 2021-06-09 MED ORDER — PREGABALIN 100 MG PO CAPS: 100.0000 mg | ORAL_CAPSULE | Freq: Two times a day (BID) | ORAL | 0 refills | Status: DC

## 2021-06-26 NOTE — Progress Notes (Signed)
Cardiology Office Note  Date: 06/27/2021   ID: [name], DOB 08/28/1956, MRN [id number]  PCP:  Stephens Shire, MD  Cardiologist:  Rozann Lesches, MD Electrophysiologist:  None   Chief Complaint: Chest pain and shortness of breath  History of Present Illness: [name] is a 65 y.o. female with a history of coronary atherosclerosis on CT scan 2019, HTN, IDA, palpitations, DM2, GERD, mild aortic stenosis, DM2, vitamin D deficiency, palpitations, morbid obesity, HLD.   She was last seen by Dr. Domenic Polite via telemedicine on 02/07/2020.  She did not report any exertional chest pain.  Her cardiovascular regimen included Cozaar and Pravachol.  Dr. Domenic Polite spoke to patient about starting a baby aspirin once daily in the setting of diabetes and known coronary atherosclerosis.  Her blood pressure was elevated on that visit.  She was on maximum dose of Cozaar.  Exercise and stress reduction were discussed.  Dr. Domenic Polite mentioned she may need an additional agent if blood pressure remained up and follow-up with PCP.  She is here today with recent complaints of increased shortness of breath over the last month or so.  She states that shortness of breath seems to have progressed over the last few months and is worse now than prior.  She states she can barely walk across her house or across the parking lot without becoming significantly short of breath.  She had some lab work on 05/01/2021 at a clinic in [place] revealing a hemoglobin of 7.8 with hematocrit of 27.3, MCV of 63.2, MCH of 18.1, MCHC of 28.6.  Iron indices indicated iron of 17, ferritin of 3, TIBC 414 and U IBC 364 without saturation of 4.  Her last echocardiogram was July 2019 which demonstrated an EF of 60 to 65% with moderate LVH.  No WMA's.  Mild MR, LA moderately dilated.  She has a history of morbid obesity and diabetes.  EKG on arrival was sinus tachycardia with a rate of 107.  She complained of some episodes of chest pain on  exertion with couple of occasions.  She denied any radiation or associated symptoms. Had a cardiac CT in November 2019 suggesting intermediate risk for future cardiac events with CAC score of 8.7 placing the patient in the 68th percentile for age and gender.  Suspected mild proximal LAD stenosis per report.   Past Medical History:  Diagnosis Date   Anxiety    Coronary atherosclerosis    Cardiac CT 09/2018 with calcium score 8.7 and mild proximal LAD disease   Essential hypertension 2009   GERD (gastroesophageal reflux disease)    Iron deficiency anemia    Osteoarthritis    Palpitations    Tachypalpitations on beta blockers since 2010   Type 2 diabetes mellitus ([place]) 2003   Vitamin D deficiency     Past Surgical History:  Procedure Laterality Date   ABDOMINAL HYSTERECTOMY     History of cervical cancer   TONSILLECTOMY      Current Outpatient Medications  Medication Sig Dispense Refill   blood glucose meter kit and supplies KIT Dispense based on patient and insurance preference. Use up to four times daily as directed. (FOR ICD-10 E11.65) 1 each 0   Cholecalciferol (VITAMIN D3) 5000 units TABS Take 1 tablet by mouth daily.      COLLAGEN PO Take 5,000 Int'l Units/day by mouth.     Cyanocobalamin (B-12 COMPLIANCE INJECTION) 1000 MCG/ML KIT Inject 1 mL as directed every 14 (fourteen) days.  cyclobenzaprine (FLEXERIL) 5 MG tablet Take 5 mg by mouth 3 (three) times daily as needed for muscle spasms.     DROPLET PEN NEEDLES 31G X 5 MM MISC USE AS INSTRUCTED TO INJECT INSULIN DAILY 100 each 3   FLUoxetine (PROZAC) 20 MG capsule Take 20 mg by mouth daily.     glucose blood (TRUE METRIX BLOOD GLUCOSE TEST) test strip TEST BLOOD SUGAR TWO TIMES DAILY 200 strip 2   insulin glargine (LANTUS SOLOSTAR) 100 UNIT/ML Solostar Pen Inject 55 Units into the skin at bedtime. 30 mL 1   levothyroxine (SYNTHROID) 50 MCG tablet Take 1 tablet (50 mcg total) by mouth daily before breakfast. 90 tablet 3    losartan (COZAAR) 100 MG tablet Take 100 mg by mouth daily.     magnesium oxide (MAG-OX) 400 MG tablet Take 400 mg by mouth 4 (four) times daily.      meloxicam (MOBIC) 15 MG tablet Take 15 mg by mouth at bedtime.     metFORMIN (GLUCOPHAGE) 1000 MG tablet TAKE 1 TABLET TWICE DAILY 180 tablet 0   pantoprazole (PROTONIX) 40 MG tablet Take 40 mg by mouth daily.       pregabalin (LYRICA) 100 MG capsule Take 1 capsule (100 mg total) by mouth 2 (two) times daily. 60 capsule 0   TRULICITY 1.5 ZO/1.0RU SOPN INJECT 1.5MG (1 PEN) SUBCUTANEOUSLY EVERY WEEK 2 mL 1   No current facility-administered medications for this visit.   Allergies:  Contrast media [iodinated diagnostic agents], Sulfa antibiotics, and Doxepin   Social History: The patient  reports that she has never smoked. She has never used smokeless tobacco. She reports that she does not drink alcohol and does not use drugs.   Family History: The patient's family history includes Diabetes in her father; Heart failure in her mother; Hypertension in her father; Stroke (age of onset: 43) in her sister.   ROS:  Please see the history of present illness. Otherwise, complete review of systems is positive for none.  All other systems are reviewed and negative.   Physical Exam: VS:  BP 120/66   Pulse (!) 114   Ht '5\' 8"'  (1.727 m)   Wt (!) 313 lb 9.6 oz (142.2 kg)   SpO2 98%   BMI 47.68 kg/m , BMI Body mass index is 47.68 kg/m.  Wt Readings from Last 3 Encounters:  06/27/21 (!) 313 lb 9.6 oz (142.2 kg)  04/15/21 (!) 309 lb (140.2 kg)  12/16/20 288 lb (130.6 kg)    General: Patient appears comfortable at rest. HEENT: Conjunctiva and lids normal, oropharynx clear with moist mucosa. Neck: Supple, no elevated JVP or carotid bruits, no thyromegaly. Lungs: Clear to auscultation, nonlabored breathing at rest. Cardiac: Regular rate and rhythm, no S3 or significant systolic murmur, no pericardial rub. Abdomen: Soft, nontender, no hepatomegaly, bowel  sounds present, no guarding or rebound. Extremities: No pitting edema, distal pulses 2+. Skin: Warm and dry. Musculoskeletal: No kyphosis. Neuropsychiatric: Alert and oriented x3, affect grossly appropriate.  ECG: 06/27/2021 sinus tachycardia with rate of 107, left axis deviation  Recent Labwork: 04/07/2021: ALT 14; AST 24; BUN 12; Creatinine, Ser 0.84; Potassium 4.7; Sodium 142; TSH 4.610     Component Value Date/Time   CHOL 106 04/17/2020 0803   TRIG 79 04/17/2020 0803   HDL 34 (L) 04/17/2020 0803   CHOLHDL 3.1 04/17/2020 0803   LDLCALC 56 04/17/2020 0803    Other Studies Reviewed Today:    Cardiac CT 10/04/2018: FINDINGS: Non-cardiac: See separate  report from [facility].   Pulmonary veins drain normally to the left atrium.   Calcium Score: 8.7 Agatston units.   Coronary Arteries: Right dominant with no anomalies   LM: No plaque or stenosis.   LAD system: Interpretation limited by motion artifact, but there is calcified plaque in the proximal LAD with mild (<50%) stenosis.   Circumflex system: Interpretation limited by motion artifact, but there does not appear to be significant disease.   RCA system: No plaque or stenosis.   IMPRESSION: 1. Coronary artery calcium score 8.7 Agatston units, placing the patient in the 68th percentile for age and gender, suggesting intermediate risk for future cardiac events.   2. Technically difficult study with motion artifact but there does not appear to be significant coronary stenosis present (suspect mild proximal LAD stenosis).   Echocardiogram 06/22/2018: Study Conclusions   - Left ventricle: The cavity size was normal. Wall thickness was    increased in a pattern of moderate LVH. Systolic function was    normal. The estimated ejection fraction was in the range of 60%    to 65%. Wall motion was normal; there were no regional wall    motion abnormalities.  - Aortic valve: Mildly calcified annulus. Trileaflet;  mildly    thickened leaflets. There was mild stenosis. Valve area (VTI):    1.78 cm^2. Valve area (Vmax): 1.58 cm^2. Valve area (Vmean): 1.93    cm^2.  - Mitral valve: Mildly calcified annulus. Mildly thickened leaflets    . There was mild regurgitation.  - Left atrium: The atrium was moderately dilated.  - Atrial septum: No defect or patent foramen ovale was identified.  - Technically adequate study.  Assessment and Plan:  1. Shortness of breath   2. Chest pain, unspecified type   3. Essential hypertension   4. Type 2 diabetes mellitus with complication, with long-term current use of insulin ([place])   5. Anemia, unspecified type    1. Shortness of breath/cardiac murmur Complaining of increasing shortness of breath over the last month or 2 becoming worse recently.  Please get an echocardiogram.  Send patient to [facility] lab for CBC basic metabolic panel, BNP, chest x-ray  2. Chest pain, unspecified type Complaining of some recent chest pain with associated shortness of breath during exertional activity.  She denies any radiation to neck, arm, back, jaw.  Denies any associated nausea, vomiting, or diaphoresis.  She had a previous cardiac CT in 2019 placing her at an intermediate risk of cardiac event and suspected proximal LAD stenosis.  Please get a Lexiscan stress test.  3. Essential hypertension Blood pressure today 120/66.  Continue losartan 100 mg p.o. daily.  History of diabetes on  4. Type 2 diabetes mellitus with complication, with long-term current use of insulin ([facility]) History of diabetes on metformin 1000 mg p.o. twice daily, Trulicity 1.5 mg subcu weekly, Lantus 55 units at bedtime.  Followed by PCP and endocrinology.  5. Anemia, unspecified type She had recent blood work at a clinic on 05/01/2021 which revealed significant anemia.  She had some lab work on 05/01/2021 at a clinic in [place] revealing a hemoglobin of 7.8 with hematocrit of 27.3, MCV of 63.2, MCH of 18.1, MCHC  of 28.6.  Iron indices indicated iron of 17, ferritin of 3, TIBC 414 and U IBC 364, iron saturation of 4.  Send patient to [facility] lab for CBC.  Have patient stay at hospital until labs are resulted.  6. Murmur Patient has an audible  systolic ejection murmur.  As noted above we are ordering an echocardiogram due to his shortness of breath and murmur.  Medication Adjustments/Labs and Tests Ordered: Current medicines are reviewed at length with the patient today.  Concerns regarding medicines are outlined above.   Disposition: Follow-up with Dr. Domenic Polite or APP 2 months.  Signed, Levell July, NP 06/27/2021 3:21 PM    [facility] [place], [place], [place] [address] Phone: [phone]; Fax: [phone]

## 2021-06-27 ENCOUNTER — Ambulatory Visit: Payer: Medicare HMO | Admitting: Family Medicine

## 2021-06-27 ENCOUNTER — Other Ambulatory Visit (HOSPITAL_COMMUNITY)
Admission: RE | Admit: 2021-06-27 | Discharge: 2021-06-27 | Disposition: A | Discharge status: Home / Self Care | Payer: Medicare HMO | Source: Ambulatory Visit | Attending: Family Medicine | Admitting: Family Medicine

## 2021-06-27 ENCOUNTER — Encounter: Payer: Self-pay | Admitting: *Deleted

## 2021-06-27 ENCOUNTER — Encounter: Payer: Self-pay | Admitting: Family Medicine

## 2021-06-27 ENCOUNTER — Telehealth: Payer: Self-pay | Admitting: Family Medicine

## 2021-06-27 ENCOUNTER — Other Ambulatory Visit: Payer: Self-pay

## 2021-06-27 ENCOUNTER — Ambulatory Visit (HOSPITAL_COMMUNITY)
Admission: RE | Admit: 2021-06-27 | Discharge: 2021-06-27 | Disposition: A | Discharge status: Home / Self Care | Payer: Medicare HMO | Source: Ambulatory Visit | Attending: Family Medicine | Admitting: Family Medicine

## 2021-06-27 VITALS — BP 120/66 | HR 114 | Ht 68.0 in | Wt 313.6 lb

## 2021-06-27 DIAGNOSIS — E118 Type 2 diabetes mellitus with unspecified complications: Secondary | ICD-10-CM

## 2021-06-27 DIAGNOSIS — R0602 Shortness of breath: Secondary | ICD-10-CM

## 2021-06-27 DIAGNOSIS — I10 Essential (primary) hypertension: Secondary | ICD-10-CM

## 2021-06-27 DIAGNOSIS — R079 Chest pain, unspecified: Secondary | ICD-10-CM | POA: Insufficient documentation

## 2021-06-27 DIAGNOSIS — R011 Cardiac murmur, unspecified: Secondary | ICD-10-CM

## 2021-06-27 DIAGNOSIS — D649 Anemia, unspecified: Secondary | ICD-10-CM

## 2021-06-27 DIAGNOSIS — Z794 Long term (current) use of insulin: Secondary | ICD-10-CM

## 2021-06-27 LAB — CBC
HCT: 32.8 % — ABNORMAL LOW (ref 36.0–46.0)
Hemoglobin: 8.3 g/dL — ABNORMAL LOW (ref 12.0–15.0)
MCH: 18 pg — ABNORMAL LOW (ref 26.0–34.0)
MCHC: 25.3 g/dL — ABNORMAL LOW (ref 30.0–36.0)
MCV: 71 fL — ABNORMAL LOW (ref 80.0–100.0)
Platelets: 236 10*3/uL (ref 150–400)
RBC: 4.62 MIL/uL (ref 3.87–5.11)
RDW: 20.4 % — ABNORMAL HIGH (ref 11.5–15.5)
WBC: 9.3 10*3/uL (ref 4.0–10.5)
nRBC: 0.2 % (ref 0.0–0.2)

## 2021-06-27 LAB — BASIC METABOLIC PANEL
Anion gap: 8 (ref 5–15)
BUN: 13 mg/dL (ref 8–23)
CO2: 21 mmol/L — ABNORMAL LOW (ref 22–32)
Calcium: 8.7 mg/dL — ABNORMAL LOW (ref 8.9–10.3)
Chloride: 104 mmol/L (ref 98–111)
Creatinine, Ser: 0.97 mg/dL (ref 0.44–1.00)
GFR, Estimated: >60 mL/min (ref >60)
Glucose, Bld: 391 mg/dL — ABNORMAL HIGH (ref 70–99)
Potassium: 4.6 mmol/L (ref 3.5–5.1)
Sodium: 133 mmol/L — ABNORMAL LOW (ref 135–145)

## 2021-06-27 LAB — BRAIN NATRIURETIC PEPTIDE: B Natriuretic Peptide: 22 pg/mL (ref 0.0–100.0)

## 2021-06-27 NOTE — Patient Instructions (Signed)
Medication Instructions:  Your physician recommends that you continue on your current medications as directed. Please refer to the Current Medication list given to you today.  Labwork: BMET,BNP, CBC Today at [facility] Lab  Testing/Procedures: Chest X-Ray today at [place] has requested that you have an echocardiogram. Echocardiography is a painless test that uses sound waves to create images of your heart. It provides your doctor with information about the size and shape of your heart and how well your heart's chambers and valves are working. This procedure takes approximately one hour. There are no restrictions for this procedure. Your physician has requested that you have a lexiscan myoview. For further information please visit [email or web address]. Please follow instruction sheet, as given.  Follow-Up: Your physician recommends that you schedule a follow-up appointment in: 2 months  Any Other Special Instructions Will Be Listed Below (If Applicable).  If you need a refill on your cardiac medications before your next appointment, please call your pharmacy.

## 2021-06-27 NOTE — Telephone Encounter (Signed)
Checking percert on the following patient for testing scheduled at [facility].     LEXISCAN ECHO  07/21/2021

## 2021-06-30 ENCOUNTER — Telehealth: Payer: Self-pay | Admitting: *Deleted

## 2021-06-30 NOTE — Telephone Encounter (Signed)
-----   Message from Merlene Laughter, RN sent at 06/30/2021  9:09 AM EDT -----  ----- Message ----- From: Verta Ellen., NP Sent: 06/27/2021   5:28 PM EDT To: Laurine Blazer, LPN  Also her blood sugar is way up at 391.  She needs to get to her PCP or endocrinologist to get that under control.    Verta Ellen, NP  06/27/2021 5:27 PM

## 2021-06-30 NOTE — Telephone Encounter (Signed)
-----   Message from Merlene Laughter, RN sent at 06/30/2021  9:09 AM EDT -----  ----- Message ----- From: Verta Ellen., NP Sent: 06/29/2021   2:59 PM EDT To: Laurine Blazer, LPN  BNP was normal.  Verta Ellen, NP  06/29/2021 2:59 PM

## 2021-06-30 NOTE — Telephone Encounter (Signed)
Patient informed. Copy sent to PCP °

## 2021-06-30 NOTE — Telephone Encounter (Signed)
-----   Message from Merlene Laughter, RN sent at 06/30/2021  9:09 AM EDT -----  ----- Message ----- From: Verta Ellen., NP Sent: 06/27/2021   5:26 PM EDT To: Laurine Blazer, LPN  CBC results are better than they were but still a little low.  Just have her follow-up with her primary.  She states she has a follow-up within the next week anyway PCP.  We still do not have the other results back yet.  Tell her we will let her know when we get the results.  Thanks  Verta Ellen, NP  06/27/2021 5:25 PM

## 2021-06-30 NOTE — Telephone Encounter (Signed)
-----   Message from Merlene Laughter, RN sent at 06/30/2021  9:08 AM EDT -----  ----- Message ----- From: Verta Ellen., NP Sent: 06/29/2021   2:58 PM EDT To: Laurine Blazer, LPN  BNP and CXR results were normal. No evidence of heart failure per these tests.  Verta Ellen, NP  06/29/2021 2:58 PM

## 2021-07-05 ENCOUNTER — Other Ambulatory Visit: Payer: Self-pay | Admitting: "Endocrinology

## 2021-07-05 DIAGNOSIS — Z794 Long term (current) use of insulin: Secondary | ICD-10-CM

## 2021-07-05 DIAGNOSIS — E118 Type 2 diabetes mellitus with unspecified complications: Secondary | ICD-10-CM

## 2021-07-07 ENCOUNTER — Other Ambulatory Visit: Payer: Self-pay | Admitting: Nurse Practitioner

## 2021-07-07 DIAGNOSIS — E118 Type 2 diabetes mellitus with unspecified complications: Secondary | ICD-10-CM

## 2021-07-07 DIAGNOSIS — Z794 Long term (current) use of insulin: Secondary | ICD-10-CM

## 2021-07-10 LAB — LIPID PANEL
Chol/HDL Ratio: 3.4 ratio (ref 0.0–4.4)
Cholesterol, Total: 142 mg/dL (ref 100–199)
HDL: 42 mg/dL (ref >39)
LDL Chol Calc (NIH): 82 mg/dL (ref 0–99)
Triglycerides: 96 mg/dL (ref 0–149)
VLDL Cholesterol Cal: 18 mg/dL (ref 5–40)

## 2021-07-10 LAB — TSH: TSH: 4.35 u[IU]/mL (ref 0.450–4.500)

## 2021-07-10 LAB — T4, FREE: Free T4: 1.19 ng/dL (ref 0.82–1.77)

## 2021-07-14 ENCOUNTER — Other Ambulatory Visit: Payer: Self-pay | Admitting: Nurse Practitioner

## 2021-07-17 ENCOUNTER — Ambulatory Visit: Payer: Medicare HMO | Admitting: Nurse Practitioner

## 2021-07-17 ENCOUNTER — Encounter: Payer: Self-pay | Admitting: Nurse Practitioner

## 2021-07-17 ENCOUNTER — Other Ambulatory Visit: Payer: Self-pay

## 2021-07-17 VITALS — BP 135/85 | HR 106 | Ht 68.0 in | Wt 310.0 lb

## 2021-07-17 DIAGNOSIS — E118 Type 2 diabetes mellitus with unspecified complications: Secondary | ICD-10-CM

## 2021-07-17 DIAGNOSIS — I10 Essential (primary) hypertension: Secondary | ICD-10-CM | POA: Diagnosis not present

## 2021-07-17 DIAGNOSIS — E782 Mixed hyperlipidemia: Secondary | ICD-10-CM

## 2021-07-17 DIAGNOSIS — Z794 Long term (current) use of insulin: Secondary | ICD-10-CM

## 2021-07-17 DIAGNOSIS — E890 Postprocedural hypothyroidism: Secondary | ICD-10-CM | POA: Diagnosis not present

## 2021-07-17 DIAGNOSIS — E559 Vitamin D deficiency, unspecified: Secondary | ICD-10-CM

## 2021-07-17 LAB — POCT GLYCOSYLATED HEMOGLOBIN (HGB A1C): Hemoglobin A1C: 9.7 % — AB (ref 4.0–5.6)

## 2021-07-17 MED ORDER — LANTUS SOLOSTAR 100 UNIT/ML ~~LOC~~ SOPN: 65.0000 [IU] | PEN_INJECTOR | Freq: Every day | SUBCUTANEOUS | 1 refills | Status: DC

## 2021-07-17 MED ORDER — LEVOTHYROXINE SODIUM 75 MCG PO TABS: 75.0000 ug | ORAL_TABLET | Freq: Every day | ORAL | 1 refills | Status: DC

## 2021-07-17 MED ORDER — TRULICITY 4.5 MG/0.5ML ~~LOC~~ SOAJ: 4.5000 mg | SUBCUTANEOUS | 3 refills | Status: DC

## 2021-07-17 NOTE — Patient Instructions (Signed)

## 2021-07-17 NOTE — Progress Notes (Signed)
07/17/2021               Endocrinology follow-up note   Subjective:    Patient ID: [name], female    DOB: [birth year].  She is returning for follow-up for management of type 2 diabetes, RAI induced hypothyroidism, hyperlipidemia.  PMD:   Stephens Shire, MD  Past Medical History:  Diagnosis Date   Anxiety    Coronary atherosclerosis    Cardiac CT 09/2018 with calcium score 8.7 and mild proximal LAD disease   Essential hypertension 2009   GERD (gastroesophageal reflux disease)    Iron deficiency anemia    Osteoarthritis    Palpitations    Tachypalpitations on beta blockers since 2010   Type 2 diabetes mellitus ([place]) 2003   Vitamin D deficiency    Past Surgical History:  Procedure Laterality Date   ABDOMINAL HYSTERECTOMY     History of cervical cancer   TONSILLECTOMY     Social History   Socioeconomic History   Marital status: Single    Spouse name: Not on file   Number of children: 2   Years of education: Not on file   Highest education level: Not on file  Occupational History   Occupation: DISABLED    Employer: UNEMPLOYED    Comment: OSTEOARTHRITIS  Tobacco Use   Smoking status: Never   Smokeless tobacco: Never  Vaping Use   Vaping Use: Never used  Substance and Sexual Activity   Alcohol use: No   Drug use: No   Sexual activity: Never  Other Topics Concern   Not on file  Social History Narrative   Has 2 grandchildren. 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   ferrous sulfate 325 (65 FE) MG EC tablet Take 325 mg by mouth daily.   blood glucose meter  kit and supplies KIT Dispense based on patient and insurance preference. Use up to four times daily as directed. (FOR ICD-10 E11.65)   Cholecalciferol (VITAMIN D3) 5000 units TABS Take 1 tablet by mouth daily.    COLLAGEN PO Take 5,000 Int'l Units/day by mouth.   Cyanocobalamin (B-12 COMPLIANCE INJECTION) 1000 MCG/ML KIT Inject 1 mL as directed every 14 (fourteen) days.   cyclobenzaprine (FLEXERIL) 5 MG tablet Take 5 mg by mouth 3 (three) times daily as needed for muscle spasms.   DROPLET PEN NEEDLES 31G X 5 MM MISC USE AS INSTRUCTED TO INJECT INSULIN DAILY   FLUoxetine (PROZAC) 20 MG capsule Take 20 mg by mouth daily.   glucose blood (TRUE METRIX BLOOD GLUCOSE TEST) test strip TEST BLOOD SUGAR TWICE DAILY   insulin glargine (LANTUS SOLOSTAR) 100 UNIT/ML Solostar Pen Inject 65 Units into the skin at bedtime.   levothyroxine (SYNTHROID) 75 MCG tablet Take 1 tablet (75 mcg total) by mouth daily before breakfast.   losartan (COZAAR) 100 MG tablet Take 100 mg by mouth daily.   magnesium oxide (MAG-OX) 400 MG tablet Take 800 mg by mouth at bedtime.   meloxicam (MOBIC) 15 MG tablet Take 15 mg by mouth at bedtime.   metFORMIN (GLUCOPHAGE)  1000 MG tablet TAKE 1 TABLET TWICE DAILY   pantoprazole (PROTONIX) 40 MG tablet Take 40 mg by mouth daily.     pregabalin (LYRICA) 100 MG capsule TAKE 1 CAPSULE TWICE DAILY   [DISCONTINUED] insulin glargine (LANTUS SOLOSTAR) 100 UNIT/ML Solostar Pen Inject 55 Units into the skin at bedtime.   [DISCONTINUED] levothyroxine (SYNTHROID) 50 MCG tablet Take 1 tablet (50 mcg total) by mouth daily before breakfast.   [DISCONTINUED] TRULICITY 1.5 GY/5.6LS SOPN INJECT 1.5MG (1 PEN) SUBCUTANEOUSLY EVERY WEEK   No facility-administered encounter medications on file as of 07/17/2021.   ALLERGIES: Allergies  Allergen Reactions   Contrast Media [Iodinated Diagnostic Agents]    Sulfa Antibiotics Nausea And Vomiting    Other reaction(s): GI Upset (intolerance) unknown   Doxepin  Rash and Swelling   VACCINATION STATUS: Immunization History  Administered Date(s) Administered   Tdap 11/24/2007    Diabetes She presents for her follow-up diabetic visit. She has type 2 diabetes mellitus. Onset time: She was diagnosed at approximate age of 35 years. Her disease course has been worsening. There are no hypoglycemic associated symptoms. Pertinent negatives for hypoglycemia include no confusion, pallor, seizures or tremors. Associated symptoms include fatigue and foot paresthesias. Pertinent negatives for diabetes include no polydipsia, no polyphagia, no polyuria and no weight loss. There are no hypoglycemic complications. Symptoms are stable. Diabetic complications include heart disease, nephropathy and peripheral neuropathy. Risk factors for coronary artery disease include diabetes mellitus, dyslipidemia, hypertension, obesity, family history, sedentary lifestyle and post-menopausal. Current diabetic treatment includes insulin injections and oral agent (monotherapy). She is compliant with treatment all of the time. Her weight is stable. She is following a generally unhealthy diet. When asked about meal planning, she reported none. She has not had a previous visit with a dietitian. She participates in exercise intermittently (has been limited lately due to fatigue). Her home blood glucose trend is increasing steadily. Her breakfast blood glucose range is generally >200 mg/dl. Her overall blood glucose range is >200 mg/dl. (She presents today with her meter and logs showing gross hyperglycemia overall.  Her POCT A1c today is 9.7%, increasing from last visit of 7.4%.  She denies any hypoglycemia.  Analysis of her meter shows 7-day average of 285, 14-day average of 279, 30-day average of 277, 90-day average of 240.  She reports she has been to the cardiologist since last visit and was diagnosed with anemia which she is now on medication to correct.  ) An ACE inhibitor/angiotensin II receptor  blocker is being taken. She does not see a podiatrist.Eye exam is current.  Hyperlipidemia This is a chronic problem. The current episode started more than 1 year ago. The problem is controlled. Recent lipid tests were reviewed and are normal. Exacerbating diseases include chronic renal disease, diabetes, hypothyroidism and obesity. There are no known factors aggravating her hyperlipidemia. Pertinent negatives include no myalgias. Current antihyperlipidemic treatment includes statins and exercise. The current treatment provides moderate improvement of lipids. There are no compliance problems.  Risk factors for coronary artery disease include dyslipidemia, diabetes mellitus, hypertension, obesity, a sedentary lifestyle, family history and post-menopausal.  Thyroid Problem Presents for follow-up visit. Symptoms include fatigue. Patient reports no cold intolerance, constipation, depressed mood, diarrhea, hair loss, heat intolerance, tremors, weight gain or weight loss. The symptoms have been stable (She was given I-131 therapy for hyperthyroidism on May 16, 2018.). Her past medical history is significant for diabetes and hyperlipidemia.   Review of systems  Constitutional: + steady weight,  current Body  mass index is 47.14 kg/m. , + fatigue-recently diagnosed with anemia and undergoing cardiac workup, no subjective hyperthermia, no subjective hypothermia Eyes: no blurry vision, no xerophthalmia ENT: no sore throat, no nodules palpated in throat, no dysphagia/odynophagia, no hoarseness Cardiovascular: no chest pain, no shortness of breath, no palpitations, no leg swelling Respiratory: no cough, no shortness of breath Gastrointestinal: no nausea/vomiting/diarrhea Musculoskeletal: no muscle/joint aches Skin: no rashes, no hyperemia, Neurological: no tremors, no dizziness, + numbness/tingling to BLE with pins and needles in toes (worse at night)-stable Psychiatric: + depression/anxiety  -controlled   Objective:    BP 135/85   Pulse (!) 106   Ht '5\' 8"'  (1.727 m)   Wt (!) 310 lb (140.6 kg)   BMI 47.14 kg/m   Wt Readings from Last 3 Encounters:  07/17/21 (!) 310 lb (140.6 kg)  06/27/21 (!) 313 lb 9.6 oz (142.2 kg)  04/15/21 (!) 309 lb (140.2 kg)    BP Readings from Last 3 Encounters:  07/17/21 135/85  06/27/21 120/66  04/15/21 (!) 143/76     Physical Exam- Limited  Constitutional:  Body mass index is 47.14 kg/m. , not in acute distress, normal state of mind Eyes:  EOMI, no exophthalmos Neck: Supple Cardiovascular: RRR, no murmurs, rubs, or gallops, no edema Respiratory: Adequate breathing efforts, no crackles, rales, rhonchi, or wheezing Musculoskeletal: no gross deformities, strength intact in all four extremities, no gross restriction of joint movements Skin:  no rashes, no hyperemia Neurological: no tremor with outstretched hands     Recent Results (from the past 2160 hour(s))  Brain natriuretic peptide     Status: None   Collection Time: 06/27/21  4:50 PM  Result Value Ref Range   B Natriuretic Peptide 22.0 0.0 - 100.0 pg/mL    Comment: Performed at [facility], [address] [place], [place] [address]  Basic metabolic panel     Status: Abnormal   Collection Time: 06/27/21  4:51 PM  Result Value Ref Range   Sodium 133 (L) 135 - 145 mmol/L   Potassium 4.6 3.5 - 5.1 mmol/L   Chloride 104 98 - 111 mmol/L   CO2 21 (L) 22 - 32 mmol/L   Glucose, Bld 391 (H) 70 - 99 mg/dL    Comment: Glucose reference range applies only to samples taken after fasting for at least 8 hours.   BUN 13 8 - 23 mg/dL   Creatinine, Ser 0.97 0.44 - 1.00 mg/dL   Calcium 8.7 (L) 8.9 - 10.3 mg/dL   GFR, Estimated >60 >60 mL/min    Comment: (NOTE) Calculated using the CKD-EPI Creatinine Equation (2021)    Anion gap 8 5 - 15    Comment: Performed at [facility], [address], [place], [place] [address]  CBC     Status: Abnormal   Collection Time: 06/27/21  4:51 PM   Result Value Ref Range   WBC 9.3 4.0 - 10.5 K/uL   RBC 4.62 3.87 - 5.11 MIL/uL   Hemoglobin 8.3 (L) 12.0 - 15.0 g/dL    Comment: Reticulocyte Hemoglobin testing may be clinically indicated, consider ordering this additional test [id number]    HCT 32.8 (L) 36.0 - 46.0 %   MCV 71.0 (L) 80.0 - 100.0 fL   MCH 18.0 (L) 26.0 - 34.0 pg   MCHC 25.3 (L) 30.0 - 36.0 g/dL   RDW 20.4 (H) 11.5 - 15.5 %   Platelets 236 150 - 400 K/uL   nRBC 0.2 0.0 - 0.2 %  Comment: Performed at [facility], [address], [place], [place] [address]  Lipid panel     Status: None   Collection Time: 07/09/21 10:16 AM  Result Value Ref Range   Cholesterol, Total 142 100 - 199 mg/dL   Triglycerides 96 0 - 149 mg/dL   HDL 42 >39 mg/dL   VLDL Cholesterol Cal 18 5 - 40 mg/dL   LDL Chol Calc (NIH) 82 0 - 99 mg/dL   Chol/HDL Ratio 3.4 0.0 - 4.4 ratio    Comment:                                   T. Chol/HDL Ratio                                             Men  Women                               1/2 Avg.Risk  3.4    3.3                                   Avg.Risk  5.0    4.4                                2X Avg.Risk  9.6    7.1                                3X Avg.Risk 23.4   11.0   TSH     Status: None   Collection Time: 07/09/21 10:16 AM  Result Value Ref Range   TSH 4.350 0.450 - 4.500 uIU/mL  T4, free     Status: None   Collection Time: 07/09/21 10:16 AM  Result Value Ref Range   Free T4 1.19 0.82 - 1.77 ng/dL  HgB A1c     Status: Abnormal   Collection Time: 07/17/21 10:08 AM  Result Value Ref Range   Hemoglobin A1C 9.7 (A) 4.0 - 5.6 %   HbA1c POC (<> result, manual entry)     HbA1c, POC (prediabetic range)     HbA1c, POC (controlled diabetic range)      Lipid Panel     Component Value Date/Time   CHOL 142 07/09/2021 1016   TRIG 96 07/09/2021 1016   HDL 42 07/09/2021 1016   CHOLHDL 3.4 07/09/2021 1016   CHOLHDL 3.1 04/17/2020 0803   LDLCALC 82 07/09/2021 1016   [place] 56 04/17/2020 0803      Assessment & Plan:   1) Type 2 diabetes mellitus with complication, with long-term current use of insulin ([place])  - Patient has currently controlled type 2 DM since 65 years of age.  She presents today with her meter and logs showing gross hyperglycemia overall.  Her POCT A1c today is 9.7%, increasing from last visit of 7.4%.  She denies any hypoglycemia.  Analysis of her meter shows 7-day average of 285, 14-day average of 279, 30-day average of 277, 90-day average of 240.  She reports she has been to the cardiologist  since last visit and was diagnosed with anemia which she is now on medication to correct.     Recent labs reviewed.   Her diabetes is complicated by obesity/sedentary life, peripheral neuropathy and patient remains at a high risk for more acute and chronic complications which include CAD, CVA, CKD, retinopathy, and neuropathy. These are all discussed in detail with the patient.  - Nutritional counseling repeated at each appointment due to patients tendency to fall back in to old habits.  - The patient admits there is a room for improvement in their diet and drink choices. -  Suggestion is made for the patient to avoid simple carbohydrates from their diet including Cakes, Sweet Desserts / Pastries, Ice Cream, Soda (diet and regular), Sweet Tea, Candies, Chips, Cookies, Sweet Pastries, Store Bought Juices, Alcohol in Excess of 1-2 drinks a day, Artificial Sweeteners, Coffee Creamer, and "Sugar-free" Products. This will help patient to have stable blood glucose profile and potentially avoid unintended weight gain.   - I encouraged the patient to switch to unprocessed or minimally processed complex starch and increased protein intake (animal or plant source), fruits, and vegetables.   - Patient is advised to stick to a routine mealtimes to eat 3 meals a day and avoid unnecessary snacks (to snack only to correct hypoglycemia).  - I have approached patient with the following  individualized plan to manage diabetes and patient agrees:   -Given her fasting hyperglycemia, she will tolerate increase of her Lantus to 65 units SQ nightly.  She can continue her Metformin 1000 mg po twice daily with meals.  I discussed and increased her Trulicity to 3 mg SQ weekly  (can take 2 of the 1.5 mg doses weekly until she depletes her current supply), then if tolerated well, she can increase to 4.5 mg SQ weekly thereafter.  -She is encouraged to continue monitoring blood glucose at least twice daily, before meals and at bedtime and call the clinic if readings are less than 70 or greater than 200 for 3 tests in a row.    She was switched to Lyrica between visits for better control of her neuropathy.  - Patient specific target  A1c;  LDL, HDL, Triglycerides were discussed in detail.  2) Lipids/HPL: Her most recent lipid panel from 04/17/20 shows controlled LDL of 56.  She is advised to continue Pravastatin 20 mg po daily at bedtime.  Side effects and precautions discussed with her.  3) Hypertension:  Her blood pressure is controlled to target.  She is advised to continue Losartan 100 mg po daily.    4) RAI induced hypothyroidism   -She is status post ablative therapy with I-131 for hyperthyroidism on May 16, 2018.    -Her previsit thyroid function tests are consistent with slight under-replacement.  She is advised to increase her dose of Levothyroxine to 75 mcg po daily before breakfast (can take 1.5 tabs of her current dose until she depletes her supply).    - We discussed about the correct intake of her thyroid hormone, on empty stomach at fasting, with water, separated by at least 30 minutes from breakfast and other medications,  and separated by more than 4 hours from calcium, iron, multivitamins, acid reflux medications (PPIs). -Patient is made aware of the fact that thyroid hormone replacement is needed for life, dose to be adjusted by periodic monitoring of thyroid function  tests.  5) Weight management:  Her Body mass index is 47.14 kg/m.-clearly complicating her diabetes care. She is a  candidate for modest weight loss.  Carbs education was provided to patient-see above. She has recently gained back the weight she previously lost with the Optiva diet.  We discussed bariatric surgery at last visit but she never followed up with them.  - I advised patient to maintain close follow up with Stephens Shire, MD for primary care needs.      I spent 35 minutes in the care of the patient today including review of labs from [place], Lipids, Thyroid Function, Hematology (current and previous including abstractions from other facilities); face-to-face time discussing  her blood glucose readings/logs, discussing hypoglycemia and hyperglycemia episodes and symptoms, medications doses, her options of short and long term treatment based on the latest standards of care / guidelines;  discussion about incorporating lifestyle medicine;  and documenting the encounter.    Please refer to Patient Instructions for Blood Glucose Monitoring and Insulin/Medications Dosing Guide"  in media tab for additional information. Please  also refer to " Patient Self Inventory" in the Media  tab for reviewed elements of pertinent patient history.  Marin E Dimino participated in the discussions, expressed understanding, and voiced agreement with the above plans.  All questions were answered to her satisfaction. she is encouraged to contact clinic should she have any questions or concerns prior to her return visit.    Follow up plan: - Return in about 3 months (around 10/17/2021) for Diabetes F/U with A1c in office, Thyroid follow up, Previsit labs, Bring meter and logs.  Rayetta Pigg, [facility] [address] [place], [place] [address] Phone: [phone] Fax: [phone]  07/17/2021, 10:36 AM

## 2021-07-18 ENCOUNTER — Telehealth: Payer: Self-pay | Admitting: Cardiology

## 2021-07-18 NOTE — Telephone Encounter (Signed)
pts echo was approved yesterday and new pa request was put out today so one from today can be withdrawn.[name]

## 2021-07-18 NOTE — Telephone Encounter (Signed)
Sending message to the prior auth pool.

## 2021-07-21 ENCOUNTER — Encounter (HOSPITAL_BASED_OUTPATIENT_CLINIC_OR_DEPARTMENT_OTHER)
Admission: RE | Admit: 2021-07-21 | Discharge: 2021-07-21 | Disposition: A | Discharge status: Home / Self Care | Payer: Medicare HMO | Source: Ambulatory Visit | Attending: Family Medicine | Admitting: Family Medicine

## 2021-07-21 ENCOUNTER — Inpatient Hospital Stay (HOSPITAL_COMMUNITY): Admission: RE | Admit: 2021-07-21 | Payer: Medicare HMO | Source: Ambulatory Visit

## 2021-07-21 ENCOUNTER — Ambulatory Visit (HOSPITAL_COMMUNITY)
Admission: RE | Admit: 2021-07-21 | Discharge: 2021-07-21 | Disposition: A | Discharge status: Home / Self Care | Payer: Medicare HMO | Source: Ambulatory Visit | Attending: Family Medicine | Admitting: Family Medicine

## 2021-07-21 ENCOUNTER — Other Ambulatory Visit: Payer: Self-pay

## 2021-07-21 DIAGNOSIS — R011 Cardiac murmur, unspecified: Secondary | ICD-10-CM | POA: Diagnosis present

## 2021-07-21 DIAGNOSIS — R079 Chest pain, unspecified: Secondary | ICD-10-CM | POA: Insufficient documentation

## 2021-07-21 DIAGNOSIS — R0602 Shortness of breath: Secondary | ICD-10-CM | POA: Insufficient documentation

## 2021-07-21 LAB — NM MYOCAR MULTI W/SPECT W/WALL MOTION / EF
LV dias vol: 62 mL (ref 46–106)
LV sys vol: 19 mL
Nuc Stress EF: 70 %
Peak HR: 114 {beats}/min
RATE: 0.3
Rest HR: 99 {beats}/min
Rest Nuclear Isotope Dose: 10.8 mCi
SDS: 4
SRS: 0
SSS: 4
ST Depression (mm): 0 mm
Stress Nuclear Isotope Dose: 30 mCi
TID: 1

## 2021-07-21 LAB — ECHOCARDIOGRAM COMPLETE
AR max vel: 1.82 cm2
AV Area VTI: 1.94 cm2
AV Area mean vel: 1.9 cm2
AV Mean grad: 6.8 mmHg
AV Peak grad: 13.7 mmHg
Ao pk vel: 1.85 m/s
Area-P 1/2: 4.26 cm2
Calc EF: 59.8 %
MV VTI: 1.78 cm2
S' Lateral: 1.94 cm
Single Plane A2C EF: 54.2 %
Single Plane A4C EF: 59.7 %

## 2021-07-21 MED ORDER — TECHNETIUM TC 99M TETROFOSMIN IV KIT
10.0000 | PACK | Freq: Once | INTRAVENOUS | Status: AC | PRN
  Administered 2021-07-21: 10.8 via INTRAVENOUS

## 2021-07-21 MED ORDER — TECHNETIUM TC 99M TETROFOSMIN IV KIT
30.0000 | PACK | Freq: Once | INTRAVENOUS | Status: AC | PRN
  Administered 2021-07-21: 30 via INTRAVENOUS

## 2021-07-21 MED ORDER — SODIUM CHLORIDE FLUSH 0.9 % IV SOLN
INTRAVENOUS | Status: AC
  Administered 2021-07-21: 10 mL via INTRAVENOUS
  Filled 2021-07-21: qty 10

## 2021-07-21 MED ORDER — REGADENOSON 0.4 MG/5ML IV SOLN
INTRAVENOUS | Status: AC
  Administered 2021-07-21: 0.4 mg via INTRAVENOUS
  Filled 2021-07-21: qty 5

## 2021-07-22 ENCOUNTER — Telehealth: Payer: Self-pay | Admitting: *Deleted

## 2021-07-22 NOTE — Telephone Encounter (Signed)
-----   Message from Merlene Laughter, RN sent at 07/22/2021  2:37 PM EDT -----  ----- Message ----- From: Verta Ellen., NP Sent: 07/22/2021   9:01 AM EDT To: Laurine Blazer, LPN  Please call the patient and let her know according to the interpreting physician the stress test was considered low risk. Verta Ellen, NP  07/22/2021 9:00 AM

## 2021-07-22 NOTE — Telephone Encounter (Signed)
Patient informed. Copy sent to PCP °

## 2021-07-22 NOTE — Telephone Encounter (Signed)
-----   Message from Merlene Laughter, RN sent at 07/22/2021  2:37 PM EDT -----  ----- Message ----- From: Verta Ellen., NP Sent: 07/22/2021   8:57 AM EDT To: Laurine Blazer, LPN  Please call the patient and let her know the echocardiogram showed good pumping function of her heart with ejection fraction 70 to 75% which is normal.  The left main pumping chamber is a little muscular.  She has a very trivial leak in her mitral valve.  This is not clinically significant.  She has some mild narrowing in her aortic valve.    Verta Ellen, NP  07/22/2021 8:54 AM

## 2021-08-06 ENCOUNTER — Other Ambulatory Visit: Payer: Self-pay | Admitting: Nurse Practitioner

## 2021-08-09 ENCOUNTER — Other Ambulatory Visit: Payer: Self-pay | Admitting: Nurse Practitioner

## 2021-08-11 ENCOUNTER — Telehealth: Payer: Self-pay

## 2021-08-11 MED ORDER — PREGABALIN 100 MG PO CAPS: 100.0000 mg | ORAL_CAPSULE | Freq: Two times a day (BID) | ORAL | 1 refills | Status: DC

## 2021-08-11 NOTE — Telephone Encounter (Signed)
Yes that's fine 

## 2021-08-11 NOTE — Telephone Encounter (Signed)
Please advise if okay to send in for 90 day supply?

## 2021-08-11 NOTE — Telephone Encounter (Signed)
Can you send the Lyrica Rx, I sent and changed it to print since this is a controlled substance. Thank you!

## 2021-08-22 ENCOUNTER — Other Ambulatory Visit: Payer: Self-pay

## 2021-08-22 DIAGNOSIS — Z794 Long term (current) use of insulin: Secondary | ICD-10-CM

## 2021-08-22 MED ORDER — METFORMIN HCL 1000 MG PO TABS: 1000.0000 mg | ORAL_TABLET | Freq: Two times a day (BID) | ORAL | 0 refills | Status: DC

## 2021-08-29 ENCOUNTER — Ambulatory Visit: Payer: Medicare HMO | Admitting: Family Medicine

## 2021-10-14 LAB — T4, FREE: Free T4: 1.46 ng/dL (ref 0.82–1.77)

## 2021-10-14 LAB — TSH: TSH: 1.73 u[IU]/mL (ref 0.450–4.500)

## 2021-10-21 NOTE — Patient Instructions (Signed)

## 2021-10-22 ENCOUNTER — Other Ambulatory Visit: Payer: Self-pay

## 2021-10-22 ENCOUNTER — Encounter: Payer: Self-pay | Admitting: Nurse Practitioner

## 2021-10-22 ENCOUNTER — Ambulatory Visit: Payer: Medicare HMO | Admitting: Nurse Practitioner

## 2021-10-22 VITALS — BP 145/83 | HR 91 | Ht 68.0 in | Wt 291.0 lb

## 2021-10-22 DIAGNOSIS — E890 Postprocedural hypothyroidism: Secondary | ICD-10-CM | POA: Diagnosis not present

## 2021-10-22 DIAGNOSIS — E118 Type 2 diabetes mellitus with unspecified complications: Secondary | ICD-10-CM

## 2021-10-22 DIAGNOSIS — I10 Essential (primary) hypertension: Secondary | ICD-10-CM | POA: Diagnosis not present

## 2021-10-22 DIAGNOSIS — Z794 Long term (current) use of insulin: Secondary | ICD-10-CM

## 2021-10-22 DIAGNOSIS — E559 Vitamin D deficiency, unspecified: Secondary | ICD-10-CM

## 2021-10-22 DIAGNOSIS — E782 Mixed hyperlipidemia: Secondary | ICD-10-CM | POA: Diagnosis not present

## 2021-10-22 LAB — POCT GLYCOSYLATED HEMOGLOBIN (HGB A1C): HbA1c POC (<> result, manual entry): 8.2 % (ref 4.0–5.6)

## 2021-10-22 NOTE — Progress Notes (Signed)
10/22/2021               Endocrinology follow-up note   Subjective:    Patient ID: [name], female    DOB: 30-Mar-1956.  She is returning for follow-up for management of type 2 diabetes, RAI induced hypothyroidism, hyperlipidemia.  PMD:   Stephens Shire, MD  Past Medical History:  Diagnosis Date   Anxiety    Coronary atherosclerosis    Cardiac CT 09/2018 with calcium score 8.7 and mild proximal LAD disease   Essential hypertension 2009   GERD (gastroesophageal reflux disease)    Iron deficiency anemia    Osteoarthritis    Palpitations    Tachypalpitations on beta blockers since 2010   Type 2 diabetes mellitus ([place]) 2003   Vitamin D deficiency    Past Surgical History:  Procedure Laterality Date   ABDOMINAL HYSTERECTOMY     History of cervical cancer   TONSILLECTOMY     Social History   Socioeconomic History   Marital status: Single    Spouse name: Not on file   Number of children: 2   Years of education: Not on file   Highest education level: Not on file  Occupational History   Occupation: DISABLED    Employer: UNEMPLOYED    Comment: OSTEOARTHRITIS  Tobacco Use   Smoking status: Never   Smokeless tobacco: Never  Vaping Use   Vaping Use: Never used  Substance and Sexual Activity   Alcohol use: No   Drug use: No   Sexual activity: Never  Other Topics Concern   Not on file  Social History Narrative   Has 2 grandchildren. 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 Use up to four times daily as directed. (FOR ICD-10 E11.65)   Cholecalciferol  (VITAMIN D3) 5000 units TABS Take 1 tablet by mouth daily.    COLLAGEN PO Take 5,000 Int'l Units/day by mouth.   Cyanocobalamin (B-12 COMPLIANCE INJECTION) 1000 MCG/ML KIT Inject 1 mL as directed every 14 (fourteen) days.   cyclobenzaprine (FLEXERIL) 5 MG tablet Take 5 mg by mouth 3 (three) times daily as needed for muscle spasms.   DROPLET PEN NEEDLES 31G X 5 MM MISC USE AS INSTRUCTED TO INJECT INSULIN DAILY   Dulaglutide (TRULICITY) 4.5 UK/0.2RK SOPN Inject 4.5 mg as directed once a week.   ferrous sulfate 325 (65 FE) MG EC tablet Take 325 mg by mouth daily.   FLUoxetine (PROZAC) 20 MG capsule Take 20 mg by mouth daily.   glucose blood (TRUE METRIX BLOOD GLUCOSE TEST) test strip TEST BLOOD SUGAR TWICE DAILY   insulin glargine (LANTUS SOLOSTAR) 100 UNIT/ML Solostar Pen Inject 65 Units into the skin at bedtime.   levothyroxine (SYNTHROID) 75 MCG tablet Take 1 tablet (75 mcg total) by mouth daily before breakfast.   losartan (COZAAR) 100 MG tablet Take 100 mg by mouth daily.   magnesium oxide (MAG-OX) 400 MG tablet Take 800 mg by mouth at bedtime.   metFORMIN (GLUCOPHAGE) 1000 MG tablet Take 1 tablet (1,000 mg total) by mouth 2 (two) times  daily.   pantoprazole (PROTONIX) 40 MG tablet Take 40 mg by mouth daily.     pregabalin (LYRICA) 100 MG capsule Take 1 capsule (100 mg total) by mouth 2 (two) times daily.   [DISCONTINUED] meloxicam (MOBIC) 15 MG tablet Take 15 mg by mouth at bedtime.   No facility-administered encounter medications on file as of 10/22/2021.   ALLERGIES: Allergies  Allergen Reactions   Contrast Media [Iodinated Diagnostic Agents]    Sulfa Antibiotics Nausea And Vomiting    Other reaction(s): GI Upset (intolerance) unknown   Doxepin Rash and Swelling   VACCINATION STATUS: Immunization History  Administered Date(s) Administered   Tdap 11/24/2007    Diabetes She presents for her follow-up diabetic visit. She has type 2 diabetes mellitus. Onset time: She was  diagnosed at approximate age of 55 years. Her disease course has been improving. There are no hypoglycemic associated symptoms. Pertinent negatives for hypoglycemia include no confusion, nervousness/anxiousness, pallor, seizures or tremors. Associated symptoms include fatigue and foot paresthesias. Pertinent negatives for diabetes include no polydipsia, no polyphagia, no polyuria and no weight loss. There are no hypoglycemic complications. Symptoms are stable. Diabetic complications include heart disease and peripheral neuropathy. Risk factors for coronary artery disease include diabetes mellitus, dyslipidemia, hypertension, obesity, family history, sedentary lifestyle and post-menopausal. Current diabetic treatment includes insulin injections and oral agent (monotherapy) (and Trulicity). She is compliant with treatment all of the time. Her weight is decreasing steadily. She is following a generally healthy diet. When asked about meal planning, she reported none. She has not had a previous visit with a dietitian. She participates in exercise intermittently (has been limited lately due to fatigue). Her home blood glucose trend is decreasing steadily. Her breakfast blood glucose range is generally 140-180 mg/dl. (She presents today with her meter and logs showing gradually improving glycemic profile overall.  Her POCT A1c today is 8.2%, improving from last visit of 9.7%.  She denies any significant hypoglycemia.  Analysis of her meter shows 7-day average of 186, 14-day average of 196, 30-day average of 209, and 90-day average of 223.  She has lost 19 lbs since last visit which is no doubt helping with diabetes management.  She says she developed a chest cold last weekend which may be affecting her most recent numbers.  She is also following cardiology for ongoing problems with anemia.) An ACE inhibitor/angiotensin II receptor blocker is being taken. She does not see a podiatrist.Eye exam is current.   Hyperlipidemia This is a chronic problem. The current episode started more than 1 year ago. The problem is controlled. Recent lipid tests were reviewed and are normal. Exacerbating diseases include chronic renal disease, diabetes, hypothyroidism and obesity. There are no known factors aggravating her hyperlipidemia. Pertinent negatives include no myalgias. Current antihyperlipidemic treatment includes statins and exercise. The current treatment provides moderate improvement of lipids. There are no compliance problems.  Risk factors for coronary artery disease include dyslipidemia, diabetes mellitus, hypertension, obesity, a sedentary lifestyle, family history and post-menopausal.  Thyroid Problem Presents for follow-up visit. Symptoms include fatigue. Patient reports no anxiety, cold intolerance, constipation, depressed mood, diarrhea, hair loss, heat intolerance, leg swelling, palpitations, tremors, weight gain or weight loss. The symptoms have been stable (She was given I-131 therapy for hyperthyroidism on May 16, 2018.). Her past medical history is significant for diabetes and hyperlipidemia.   Review of systems  Constitutional: + steadily losing weight (19 lbs),  current Body mass index is 44.25 kg/m. ,+  fatigue, no subjective hyperthermia, no  subjective hypothermia Eyes: no blurry vision, no xerophthalmia ENT: no sore throat, no nodules palpated in throat, no dysphagia/odynophagia, no hoarseness Cardiovascular: no chest pain, no shortness of breath, no palpitations, no leg swelling Respiratory: no cough, no shortness of breath, complains of chest cold Gastrointestinal: no nausea/vomiting/diarrhea Musculoskeletal: no muscle/joint aches Skin: no rashes, no hyperemia Neurological: no tremors, no dizziness, + numbness/tingling to BLE with pins and needles in toes (worse at night)-stable on Lyrica Psychiatric: + depression/anxiety -controlled   Objective:    BP (!) 145/83   Pulse 91   Ht  '5\' 8"'  (1.727 m)   Wt 291 lb (132 kg)   BMI 44.25 kg/m   Wt Readings from Last 3 Encounters:  10/22/21 291 lb (132 kg)  07/17/21 (!) 310 lb (140.6 kg)  06/27/21 (!) 313 lb 9.6 oz (142.2 kg)    BP Readings from Last 3 Encounters:  10/22/21 (!) 145/83  07/17/21 135/85  06/27/21 120/66     Physical Exam- Limited  Constitutional:  Body mass index is 44.25 kg/m. , not in acute distress, normal state of mind Eyes:  EOMI, no exophthalmos Neck: Supple Cardiovascular: RRR, no murmurs, rubs, or gallops, no edema Respiratory: Adequate breathing efforts, no crackles, rales, rhonchi, or wheezing Musculoskeletal: no gross deformities, strength intact in all four extremities, no gross restriction of joint movements Skin:  no rashes, no hyperemia Neurological: no tremor with outstretched hands     Recent Results (from the past 2160 hour(s))  TSH     Status: None   Collection Time: 10/13/21  8:53 AM  Result Value Ref Range   TSH 1.730 0.450 - 4.500 uIU/mL  T4, free     Status: None   Collection Time: 10/13/21  8:53 AM  Result Value Ref Range   Free T4 1.46 0.82 - 1.77 ng/dL  HgB A1c     Status: Abnormal   Collection Time: 10/22/21 10:15 AM  Result Value Ref Range   Hemoglobin A1C     HbA1c POC (<> result, manual entry) 8.2 4.0 - 5.6 %   HbA1c, POC (prediabetic range)     HbA1c, POC (controlled diabetic range)      Lipid Panel     Component Value Date/Time   CHOL 142 07/09/2021 1016   TRIG 96 07/09/2021 1016   HDL 42 07/09/2021 1016   CHOLHDL 3.4 07/09/2021 1016   CHOLHDL 3.1 04/17/2020 0803   LDLCALC 82 07/09/2021 1016   LDLCALC 56 04/17/2020 0803     Assessment & Plan:   1) Type 2 diabetes mellitus with complication, with long-term current use of insulin (HCC)  - Patient has currently controlled type 2 DM since 65 years of age.  She presents today with her meter and logs showing gradually improving glycemic profile overall.  Her POCT A1c today is 8.2%, improving  from last visit of 9.7%.  She denies any significant hypoglycemia.  Analysis of her meter shows 7-day average of 186, 14-day average of 196, 30-day average of 209, and 90-day average of 223.  She has lost 19 lbs since last visit which is no doubt helping with diabetes management.  She says she developed a chest cold last weekend which may be affecting her most recent numbers.  She is also following cardiology for ongoing problems with anemia.   Recent labs reviewed.   Her diabetes is complicated by obesity/sedentary life, peripheral neuropathy and patient remains at a high risk for more acute and chronic complications which include CAD, CVA, CKD, retinopathy,  and neuropathy. These are all discussed in detail with the patient.  - Nutritional counseling repeated at each appointment due to patients tendency to fall back in to old habits.  - The patient admits there is a room for improvement in their diet and drink choices. -  Suggestion is made for the patient to avoid simple carbohydrates from their diet including Cakes, Sweet Desserts / Pastries, Ice Cream, Soda (diet and regular), Sweet Tea, Candies, Chips, Cookies, Sweet Pastries, Store Bought Juices, Alcohol in Excess of 1-2 drinks a day, Artificial Sweeteners, Coffee Creamer, and "Sugar-free" Products. This will help patient to have stable blood glucose profile and potentially avoid unintended weight gain.   - I encouraged the patient to switch to unprocessed or minimally processed complex starch and increased protein intake (animal or plant source), fruits, and vegetables.   - Patient is advised to stick to a routine mealtimes to eat 3 meals a day and avoid unnecessary snacks (to snack only to correct hypoglycemia).  - I have approached patient with the following individualized plan to manage diabetes and patient agrees:   -Given her continued improvement over the last several weeks, no changes will be made to her medications today.  She is  advised to continue her Lantus 65 units SQ nightly, Metformin 1000 mg po twice daily with meals, and Trulicity 4.5 mg SQ weekly (tolerating well).   -She is encouraged to continue monitoring blood glucose at least twice daily, before meals and at bedtime and call the clinic if readings are less than 70 or greater than 200 for 3 tests in a row.    She was switched to Lyrica between visits for better control of her neuropathy and it has helped greatly.  - Patient specific target  A1c;  LDL, HDL, Triglycerides were discussed in detail.  2) Lipids/HPL: Her most recent lipid panel from 07/09/21 shows controlled LDL of 82.  She is advised to continue Pravastatin 20 mg po daily at bedtime.  Side effects and precautions discussed with her.  3) Hypertension:  Her blood pressure is controlled to target.  She is advised to continue Losartan 100 mg po daily.    4) RAI induced hypothyroidism   -She is status post ablative therapy with I-131 for hyperthyroidism on May 16, 2018.    -Her previsit thyroid function tests are consistent with appropriate hormone replacement.  She is advised to continue Levothyroxine 75 mcg po daily before breakfast.    - We discussed about the correct intake of her thyroid hormone, on empty stomach at fasting, with water, separated by at least 30 minutes from breakfast and other medications,  and separated by more than 4 hours from calcium, iron, multivitamins, acid reflux medications (PPIs). -Patient is made aware of the fact that thyroid hormone replacement is needed for life, dose to be adjusted by periodic monitoring of thyroid function tests.  5) Weight management:  Her Body mass index is 44.25 kg/m.-clearly complicating her diabetes care. She is a candidate for modest weight loss.  Carbs education was provided to patient-see above. She has recently lost 19 lbs.  We discussed bariatric surgery at last visit but she never followed up with them.  - I advised patient to  maintain close follow up with Stephens Shire, MD for primary care needs.      I spent 30 minutes in the care of the patient today including review of labs from [place], Lipids, Thyroid Function, Hematology (current and previous including abstractions from other facilities);  face-to-face time discussing  her blood glucose readings/logs, discussing hypoglycemia and hyperglycemia episodes and symptoms, medications doses, her options of short and long term treatment based on the latest standards of care / guidelines;  discussion about incorporating lifestyle medicine;  and documenting the encounter.    Please refer to Patient Instructions for Blood Glucose Monitoring and Insulin/Medications Dosing Guide"  in media tab for additional information. Please  also refer to " Patient Self Inventory" in the Media  tab for reviewed elements of pertinent patient history.  [name] participated in the discussions, expressed understanding, and voiced agreement with the above plans.  All questions were answered to her satisfaction. she is encouraged to contact clinic should she have any questions or concerns prior to her return visit.    Follow up plan: - Return in about 4 months (around 02/19/2022) for Diabetes F/U with A1c in office, Thyroid follow up, No previsit labs, Bring meter and logs.  Rayetta Pigg, [facility] [address] [place], [place] [address] Phone: [phone] Fax: [phone]  10/22/2021, 10:26 AM

## 2021-10-25 ENCOUNTER — Other Ambulatory Visit: Payer: Self-pay | Admitting: "Endocrinology

## 2021-10-25 DIAGNOSIS — Z794 Long term (current) use of insulin: Secondary | ICD-10-CM

## 2021-10-25 DIAGNOSIS — E118 Type 2 diabetes mellitus with unspecified complications: Secondary | ICD-10-CM

## 2021-12-15 ENCOUNTER — Other Ambulatory Visit: Payer: Self-pay | Admitting: Nurse Practitioner

## 2022-01-03 IMAGING — DX DG CHEST 2V
2 series · 2 of 2 positions shown · non-contrast
Comparison: 03/18/2013

CLINICAL DATA: SOB

EXAM:
CHEST - 2 VIEW

[chest pa]
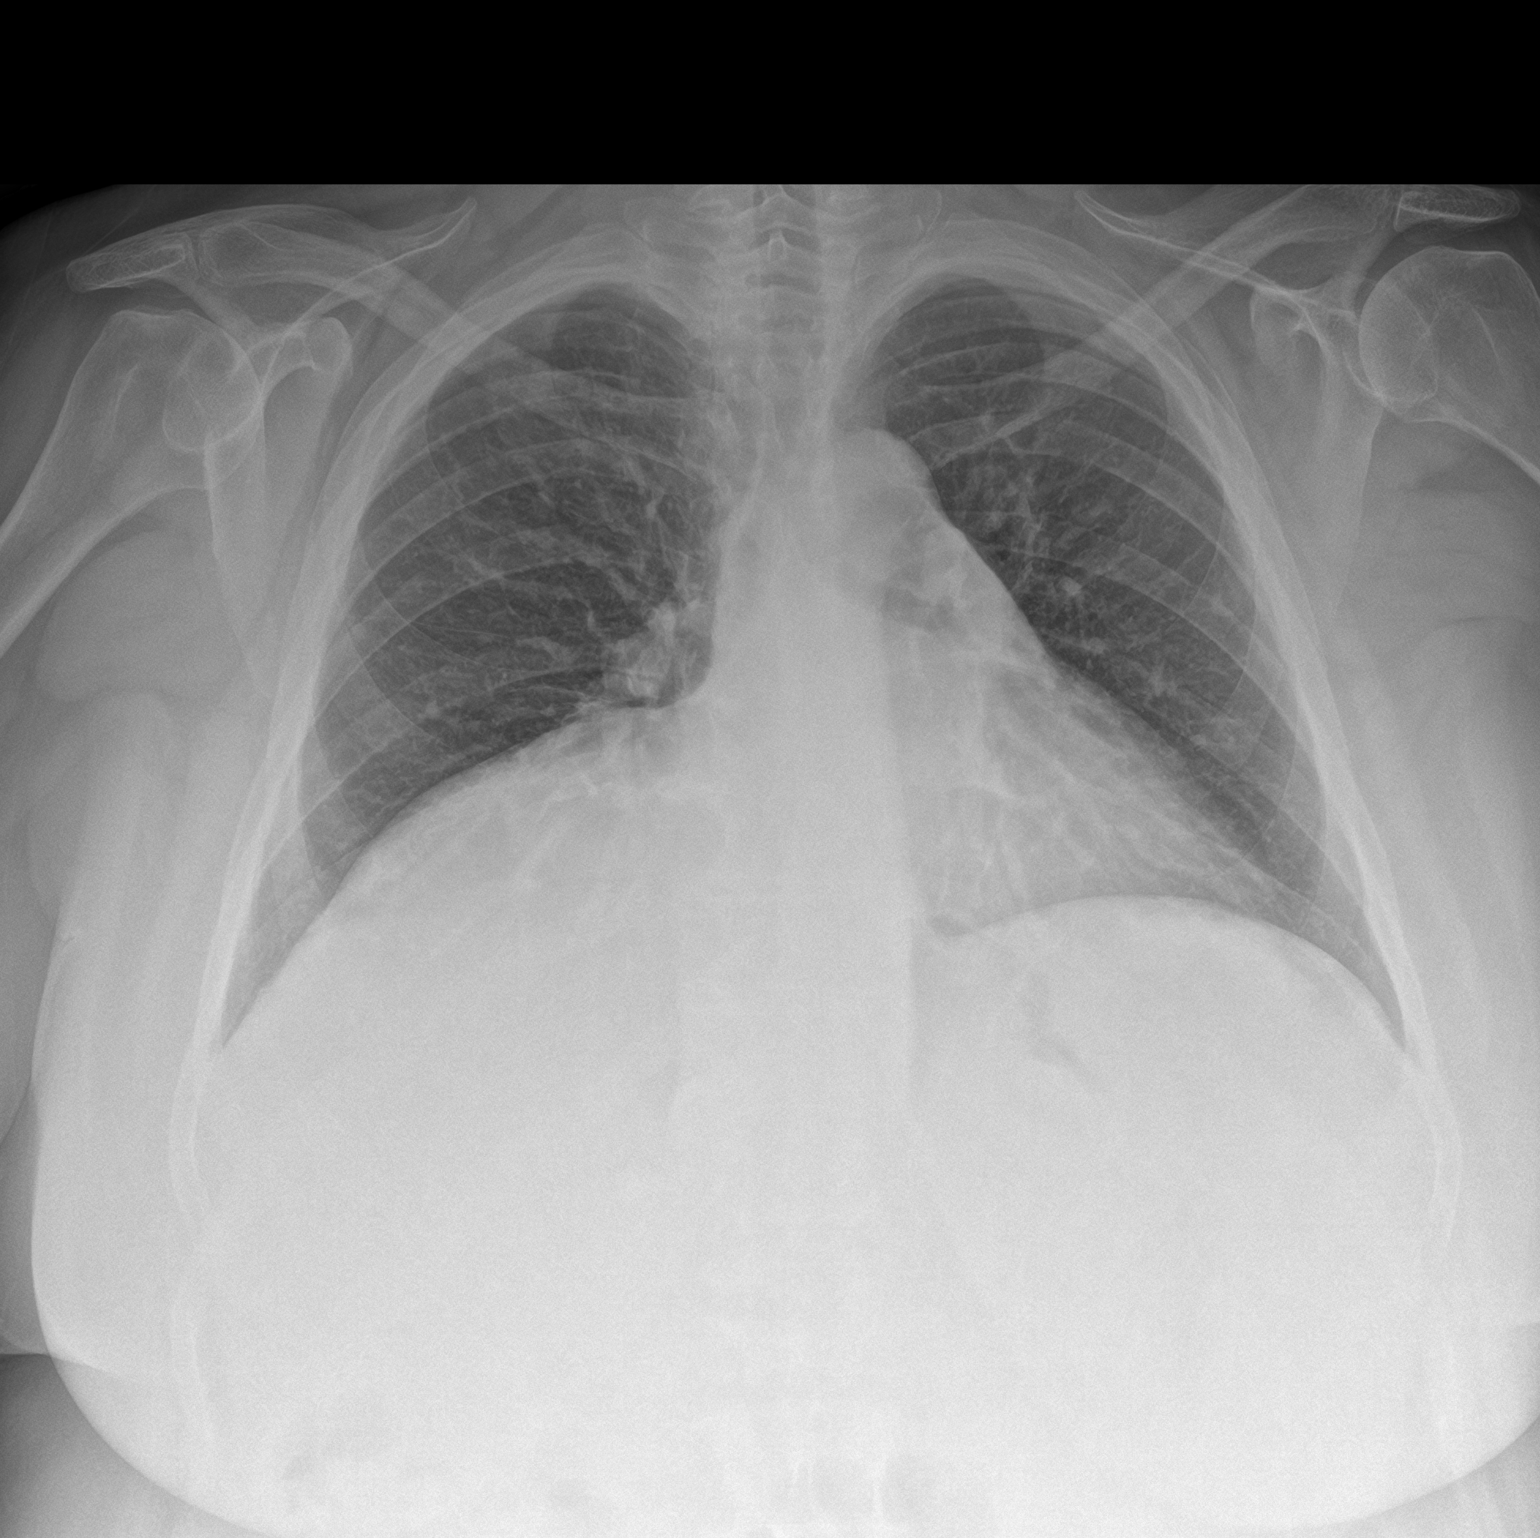

[chest lat]
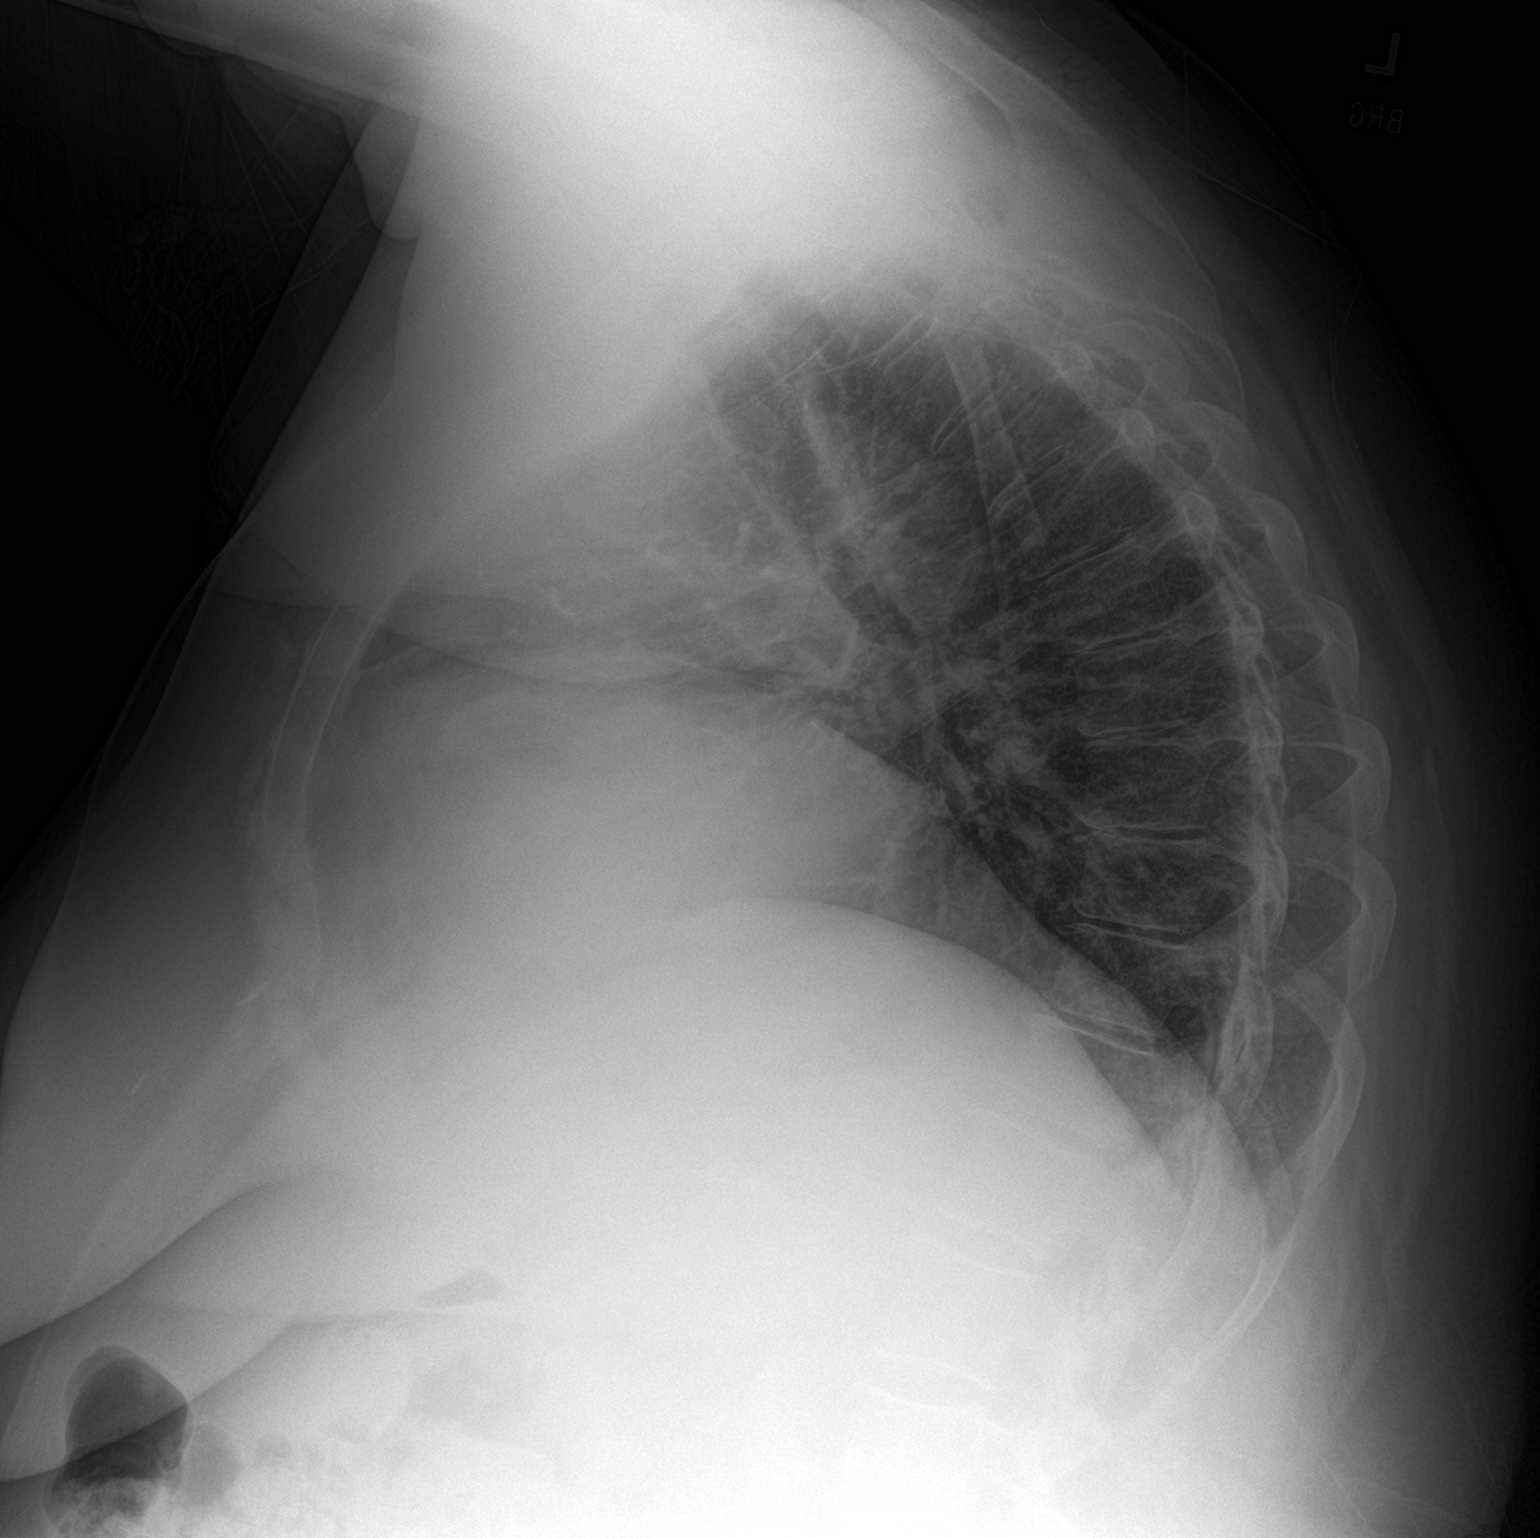

[2 of 2 positions shown; findings below may reference images not displayed]

FINDINGS: Stable elevation of the right hemidiaphragm. Heart and mediastinal
contours are within normal limits. No focal opacities or effusions.
No acute bony abnormality.
IMPRESSION: No active cardiopulmonary disease.

## 2022-01-08 ENCOUNTER — Other Ambulatory Visit: Payer: Self-pay | Admitting: Nurse Practitioner

## 2022-01-09 NOTE — Telephone Encounter (Signed)
Last OV 10/22/2021  Next OV 02/19/2022  Last filled 08/11/2021 #[id number] with 1 refill

## 2022-01-28 ENCOUNTER — Other Ambulatory Visit: Payer: Self-pay | Admitting: Nurse Practitioner

## 2022-02-18 NOTE — Patient Instructions (Signed)

## 2022-02-19 ENCOUNTER — Ambulatory Visit: Payer: Medicare HMO | Admitting: Nurse Practitioner

## 2022-02-19 ENCOUNTER — Encounter: Payer: Self-pay | Admitting: Nurse Practitioner

## 2022-02-19 VITALS — BP 116/74 | HR 108 | Ht 68.0 in | Wt 287.4 lb

## 2022-02-19 DIAGNOSIS — E559 Vitamin D deficiency, unspecified: Secondary | ICD-10-CM

## 2022-02-19 DIAGNOSIS — E782 Mixed hyperlipidemia: Secondary | ICD-10-CM

## 2022-02-19 DIAGNOSIS — Z794 Long term (current) use of insulin: Secondary | ICD-10-CM

## 2022-02-19 DIAGNOSIS — E890 Postprocedural hypothyroidism: Secondary | ICD-10-CM | POA: Diagnosis not present

## 2022-02-19 DIAGNOSIS — E118 Type 2 diabetes mellitus with unspecified complications: Secondary | ICD-10-CM

## 2022-02-19 DIAGNOSIS — I10 Essential (primary) hypertension: Secondary | ICD-10-CM | POA: Diagnosis not present

## 2022-02-19 LAB — POCT GLYCOSYLATED HEMOGLOBIN (HGB A1C): HbA1c, POC (controlled diabetic range): 9.9 % — AB (ref 0.0–7.0)

## 2022-02-19 MED ORDER — PREGABALIN 100 MG PO CAPS: 100.0000 mg | ORAL_CAPSULE | Freq: Two times a day (BID) | ORAL | 1 refills | Status: DC

## 2022-02-19 MED ORDER — LANTUS SOLOSTAR 100 UNIT/ML ~~LOC~~ SOPN: 75.0000 [IU] | PEN_INJECTOR | Freq: Every day | SUBCUTANEOUS | 0 refills | Status: DC

## 2022-02-19 MED ORDER — TIRZEPATIDE 5 MG/0.5ML ~~LOC~~ SOAJ: 5.0000 mg | SUBCUTANEOUS | 3 refills | Status: DC

## 2022-02-19 NOTE — Progress Notes (Signed)
? ? ?02/20/2022 ? ?             ?Endocrinology follow-up note ? ? ?Subjective:  ? ? Patient ID: [name], female    DOB: [birth year].  She is returning for follow-up for management of type 2 diabetes, RAI induced hypothyroidism, hyperlipidemia. ? ?PMD:   Stephens Shire, MD ? ?Past Medical History:  ?Diagnosis Date  ? Anxiety   ? Coronary atherosclerosis   ? Cardiac CT 09/2018 with calcium score 8.7 and mild proximal LAD disease  ? Essential hypertension 2009  ? GERD (gastroesophageal reflux disease)   ? Iron deficiency anemia   ? Osteoarthritis   ? Palpitations   ? Tachypalpitations on beta blockers since 2010  ? Type 2 diabetes mellitus ([place]) 2003  ? Vitamin D deficiency   ? ?Past Surgical History:  ?Procedure Laterality Date  ? ABDOMINAL HYSTERECTOMY    ? History of cervical cancer  ? TONSILLECTOMY    ? ?Social History  ? ?Socioeconomic History  ? Marital status: Single  ?  Spouse name: Not on file  ? Number of children: 2  ? Years of education: Not on file  ? Highest education level: Not on file  ?Occupational History  ? Occupation: DISABLED  ?  Employer: UNEMPLOYED  ?  Comment: OSTEOARTHRITIS  ?Tobacco Use  ? Smoking status: Never  ? Smokeless tobacco: Never  ?Vaping Use  ? Vaping Use: Never used  ?Substance and Sexual Activity  ? Alcohol use: No  ? Drug use: No  ? Sexual activity: Never  ?Other Topics Concern  ? Not on file  ?Social History Narrative  ? Has 2 grandchildren. 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 ?Social Determinants of Health  ? ?Financial Resource Strain: Not on file  ?Food Insecurity: Not on file  ?Transportation Needs: Not on file  ?Physical Activity: Not on file  ?Stress: Not on file  ?Social Connections: Not on file  ? ?Outpatient Encounter Medications as of 02/19/2022  ?Medication Sig  ? blood glucose meter kit and supplies KIT Dispense based on patient and insurance preference. Use up to four times daily as directed. (FOR ICD-10 E11.65)  ? Cholecalciferol  (VITAMIN D3) 5000 units TABS Take 1 tablet by mouth daily.   ? COLLAGEN PO Take 5,000 Int'l Units/day by mouth.  ? Cyanocobalamin (B-12 COMPLIANCE INJECTION) 1000 MCG/ML KIT Inject 1 mL as directed every 14 (fourteen) days.  ? cyclobenzaprine (FLEXERIL) 10 MG tablet Take 10 mg by mouth 3 (three) times daily.  ? DROPLET PEN NEEDLES 31G X 5 MM MISC USE AS INSTRUCTED TO INJECT INSULIN DAILY  ? ferrous sulfate 325 (65 FE) MG EC tablet Take 325 mg by mouth daily.  ? FLUoxetine (PROZAC) 20 MG capsule Take 20 mg by mouth daily.  ? glucose blood (TRUE METRIX BLOOD GLUCOSE TEST) test strip TEST BLOOD SUGAR TWICE DAILY  ? levothyroxine (SYNTHROID) 75 MCG tablet TAKE 1 TABLET (75 MCG TOTAL) BY MOUTH DAILY BEFORE BREAKFAST.  ? losartan (COZAAR) 100 MG tablet Take 100 mg by mouth daily.  ? magnesium oxide (MAG-OX) 400 MG tablet Take 800 mg by mouth at bedtime.  ? metFORMIN (GLUCOPHAGE) 1000 MG tablet TAKE 1 TABLET TWICE DAILY  ? pantoprazole (PROTONIX) 40 MG tablet Take 40 mg by mouth daily.    ? tirzepatide (MOUNJARO) 5 MG/0.5ML Pen Inject 5 mg into the skin once a week.  ? [DISCONTINUED] Dulaglutide (TRULICITY) 4.5 NO/6.7EH SOPN Inject 4.5 mg as directed  once a week.  ? [DISCONTINUED] LANTUS SOLOSTAR 100 UNIT/ML Solostar Pen INJECT 65 UNITS INTO THE SKIN AT BEDTIME  ? [DISCONTINUED] pregabalin (LYRICA) 100 MG capsule TAKE 1 CAPSULE TWICE DAILY  ? insulin glargine (LANTUS SOLOSTAR) 100 UNIT/ML Solostar Pen Inject 75 Units into the skin at bedtime.  ? pregabalin (LYRICA) 100 MG capsule Take 1 capsule (100 mg total) by mouth 2 (two) times daily.  ? [DISCONTINUED] cyclobenzaprine (FLEXERIL) 5 MG tablet Take 5 mg by mouth 3 (three) times daily as needed for muscle spasms. (Patient not taking: Reported on 02/19/2022)  ? ?No facility-administered encounter medications on file as of 02/19/2022.  ? ?ALLERGIES: ?Allergies  ?Allergen Reactions  ? Contrast Media [Iodinated Contrast Media]   ? Sulfa Antibiotics Nausea And Vomiting  ?  Other  reaction(s): GI Upset (intolerance) ?unknown  ? Doxepin Rash and Swelling  ? ?VACCINATION STATUS: ?Immunization History  ?Administered Date(s) Administered  ? Tdap 11/24/2007  ? ? ?Diabetes ?She presents for her follow-up diabetic visit. She has type 2 diabetes mellitus. Onset time: She was diagnosed at approximate age of 39 years. Her disease course has been fluctuating. There are no hypoglycemic associated symptoms. Pertinent negatives for hypoglycemia include no confusion, nervousness/anxiousness, pallor, seizures or tremors. Associated symptoms include fatigue and foot paresthesias. Pertinent negatives for diabetes include no polydipsia, no polyphagia, no polyuria and no weight loss. There are no hypoglycemic complications. Symptoms are stable. Diabetic complications include heart disease and peripheral neuropathy. Risk factors for coronary artery disease include diabetes mellitus, dyslipidemia, hypertension, obesity, family history, sedentary lifestyle and post-menopausal. Current diabetic treatment includes insulin injections and oral agent (monotherapy) (and Trulicity). She is compliant with treatment all of the time. Her weight is decreasing steadily. She is following a generally healthy diet. When asked about meal planning, she reported none. She has not had a previous visit with a dietitian. She participates in exercise intermittently (has been limited lately due to fatigue). Her home blood glucose trend is increasing steadily. Her breakfast blood glucose range is generally >200 mg/dl. Her overall blood glucose range is >200 mg/dl. (She presents today with her logs, no meter, showing increasing fasting glucose readings over the last several weeks.  This comes as a surprise to her as she has not changed anything, still eating the same foods, at the same time, denies any infections or steroid use recently.  She does admit she has been a bit more stressed lately.  Her POCT A1c today is 9.9%, increasing from  last visit of 8.2%.) An ACE inhibitor/angiotensin II receptor blocker is being taken. She does not see a podiatrist.Eye exam is current.  ?Hyperlipidemia ?This is a chronic problem. The current episode started more than 1 year ago. The problem is controlled. Recent lipid tests were reviewed and are normal. Exacerbating diseases include chronic renal disease, diabetes, hypothyroidism and obesity. There are no known factors aggravating her hyperlipidemia. Pertinent negatives include no myalgias. Current antihyperlipidemic treatment includes statins and exercise. The current treatment provides moderate improvement of lipids. There are no compliance problems.  Risk factors for coronary artery disease include dyslipidemia, diabetes mellitus, hypertension, obesity, a sedentary lifestyle, family history and post-menopausal.  ?Thyroid Problem ?Presents for follow-up visit. Symptoms include fatigue. Patient reports no anxiety, cold intolerance, constipation, depressed mood, diarrhea, hair loss, heat intolerance, leg swelling, palpitations, tremors, weight gain or weight loss. The symptoms have been stable (She was given I-131 therapy for hyperthyroidism on May 16, 2018.). Her past medical history is significant for diabetes and hyperlipidemia.  ? ?  Review of systems ? ?Constitutional: + steadily losing weight (23 lbs),  current Body mass index is 43.7 kg/m?. ,+ fatigue, no subjective hyperthermia, no subjective hypothermia ?Eyes: no blurry vision, no xerophthalmia ?ENT: no sore throat, no nodules palpated in throat, no dysphagia/odynophagia, no hoarseness ?Cardiovascular: no chest pain, no shortness of breath, no palpitations, no leg swelling ?Respiratory: no cough, no shortness of breath ?Gastrointestinal: no nausea/vomiting/diarrhea ?Musculoskeletal: no muscle/joint aches ?Skin: no rashes, no hyperemia ?Neurological: no tremors, no dizziness, + numbness/tingling to BLE with pins and needles in toes (worse at night)-stable  on Lyrica ?Psychiatric: + depression/anxiety -controlled ? ? ?Objective:  ?  ?BP 116/74   Pulse (!) 108   Ht '5\' 8"'  (1.727 m)   Wt 287 lb 6.4 oz (130.4 kg)   SpO2 96%   BMI 43.70 kg/m?   ?Wt Readings from [place]

## 2022-02-20 ENCOUNTER — Encounter: Payer: Self-pay | Admitting: Nurse Practitioner

## 2022-02-24 NOTE — Telephone Encounter (Signed)
Me neither, no PA requests or anything just yet but she says her insurance is questioning it

## 2022-03-30 ENCOUNTER — Other Ambulatory Visit: Payer: Self-pay | Admitting: Nurse Practitioner

## 2022-03-30 DIAGNOSIS — Z794 Long term (current) use of insulin: Secondary | ICD-10-CM

## 2022-05-04 ENCOUNTER — Other Ambulatory Visit: Payer: Self-pay | Admitting: Nurse Practitioner

## 2022-05-18 ENCOUNTER — Other Ambulatory Visit: Payer: Self-pay | Admitting: "Endocrinology

## 2022-05-18 ENCOUNTER — Other Ambulatory Visit: Payer: Self-pay | Admitting: Nurse Practitioner

## 2022-05-18 DIAGNOSIS — Z794 Long term (current) use of insulin: Secondary | ICD-10-CM

## 2022-05-20 LAB — TSH: TSH: 4.29 u[IU]/mL (ref 0.450–4.500)

## 2022-05-20 LAB — T4, FREE: Free T4: 1.12 ng/dL (ref 0.82–1.77)

## 2022-05-28 ENCOUNTER — Encounter: Payer: Self-pay | Admitting: Nurse Practitioner

## 2022-05-28 ENCOUNTER — Ambulatory Visit: Payer: Medicare HMO | Admitting: Nurse Practitioner

## 2022-05-28 VITALS — BP 128/71 | HR 81 | Ht 68.0 in | Wt 297.0 lb

## 2022-05-28 DIAGNOSIS — E890 Postprocedural hypothyroidism: Secondary | ICD-10-CM

## 2022-05-28 DIAGNOSIS — E118 Type 2 diabetes mellitus with unspecified complications: Secondary | ICD-10-CM

## 2022-05-28 DIAGNOSIS — E559 Vitamin D deficiency, unspecified: Secondary | ICD-10-CM

## 2022-05-28 DIAGNOSIS — I10 Essential (primary) hypertension: Secondary | ICD-10-CM

## 2022-05-28 DIAGNOSIS — Z794 Long term (current) use of insulin: Secondary | ICD-10-CM

## 2022-05-28 DIAGNOSIS — E782 Mixed hyperlipidemia: Secondary | ICD-10-CM | POA: Diagnosis not present

## 2022-05-28 LAB — POCT GLYCOSYLATED HEMOGLOBIN (HGB A1C): HbA1c POC (<> result, manual entry): 11.2 % (ref 4.0–5.6)

## 2022-05-28 MED ORDER — LEVOTHYROXINE SODIUM 88 MCG PO TABS: 88.0000 ug | ORAL_TABLET | Freq: Every day | ORAL | 1 refills | Status: DC

## 2022-05-28 MED ORDER — TIRZEPATIDE 7.5 MG/0.5ML ~~LOC~~ SOAJ: 7.5000 mg | SUBCUTANEOUS | 0 refills | Status: DC

## 2022-05-28 NOTE — Progress Notes (Signed)
05/28/2022               Endocrinology follow-up note   Subjective:    Patient ID: [name], female    DOB: [birth year].  She is returning for follow-up for management of type 2 diabetes, RAI induced hypothyroidism, hyperlipidemia.  PMD:   Stephens Shire, MD  Past Medical History:  Diagnosis Date   Anxiety    Coronary atherosclerosis    Cardiac CT 09/2018 with calcium score 8.7 and mild proximal LAD disease   Essential hypertension 2009   GERD (gastroesophageal reflux disease)    Iron deficiency anemia    Osteoarthritis    Palpitations    Tachypalpitations on beta blockers since 2010   Type 2 diabetes mellitus ([place]) 2003   Vitamin D deficiency    Past Surgical History:  Procedure Laterality Date   ABDOMINAL HYSTERECTOMY     History of cervical cancer   TONSILLECTOMY     Social History   Socioeconomic History   Marital status: Single    Spouse name: Not on file   Number of children: 2   Years of education: Not on file   Highest education level: Not on file  Occupational History   Occupation: DISABLED    Employer: UNEMPLOYED    Comment: OSTEOARTHRITIS  Tobacco Use   Smoking status: Never   Smokeless tobacco: Never  Vaping Use   Vaping Use: Never used  Substance and Sexual Activity   Alcohol use: No   Drug use: No   Sexual activity: Never  Other Topics Concern   Not on file  Social History Narrative   Has 2 grandchildren. 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   blood glucose meter kit and supplies KIT Dispense based on patient and insurance preference. Use  up to four times daily as directed. (FOR ICD-10 E11.65)   Cholecalciferol (VITAMIN D3) 5000 units TABS Take 1 tablet by mouth daily.    COLLAGEN PO Take 5,000 Int'l Units/day by mouth.   cyclobenzaprine (FLEXERIL) 10 MG tablet Take 10 mg by mouth 3 (three) times daily.   DROPLET PEN NEEDLES 31G X 5 MM MISC USE AS INSTRUCTED TO INJECT INSULIN DAILY   ferrous sulfate 325 (65 FE) MG EC tablet Take 325 mg by mouth daily.   FLUoxetine (PROZAC) 20 MG capsule Take 20 mg by mouth daily.   glucose blood (TRUE METRIX BLOOD GLUCOSE TEST) test strip TEST BLOOD SUGAR TWICE DAILY   LANTUS SOLOSTAR 100 UNIT/ML Solostar Pen INJECT 75 UNITS INTO THE SKIN AT BEDTIME.   levothyroxine (SYNTHROID) 88 MCG tablet Take 1 tablet (88 mcg total) by mouth daily before breakfast.   losartan (COZAAR) 100 MG tablet Take 100 mg by mouth daily.   magnesium oxide (MAG-OX) 400 MG tablet Take 800 mg by mouth at bedtime.   metFORMIN (GLUCOPHAGE) 1000 MG tablet TAKE 1 TABLET TWICE DAILY   pantoprazole (PROTONIX) 40 MG tablet Take 40 mg by mouth daily.     pregabalin (LYRICA) 100 MG capsule Take 1 capsule (100 mg total) by mouth  2 (two) times daily.   [DISCONTINUED] Cyanocobalamin (B-12 COMPLIANCE INJECTION) 1000 MCG/ML KIT Inject 1 mL as directed every 14 (fourteen) days.   [DISCONTINUED] levothyroxine (SYNTHROID) 75 MCG tablet TAKE 1 TABLET (75 MCG TOTAL) BY MOUTH DAILY BEFORE BREAKFAST.   [DISCONTINUED] tirzepatide [facility]) 5 MG/0.5ML Pen Inject 5 mg into the skin once a week.   No facility-administered encounter medications on file as of 05/28/2022.   ALLERGIES: Allergies  Allergen Reactions   Contrast Media [Iodinated Contrast Media]    Sulfa Antibiotics Nausea And Vomiting    Other reaction(s): GI Upset (intolerance) unknown   Doxepin Rash and Swelling   VACCINATION STATUS: Immunization History  Administered Date(s) Administered   Tdap 11/24/2007    Diabetes She presents for her follow-up diabetic visit. She has  type 2 diabetes mellitus. Onset time: She was diagnosed at approximate age of 36 years. Her disease course has been fluctuating. There are no hypoglycemic associated symptoms. Pertinent negatives for hypoglycemia include no confusion, nervousness/anxiousness, pallor, seizures or tremors. Associated symptoms include fatigue and foot paresthesias. Pertinent negatives for diabetes include no polydipsia, no polyphagia, no polyuria and no weight loss. There are no hypoglycemic complications. Symptoms are stable. Diabetic complications include heart disease and peripheral neuropathy. Risk factors for coronary artery disease include diabetes mellitus, dyslipidemia, hypertension, obesity, family history, sedentary lifestyle and post-menopausal. Current diabetic treatment includes insulin injections and oral agent (monotherapy) (and Mounjaro). She is compliant with treatment all of the time. Her weight is fluctuating minimally. She is following a generally healthy diet. When asked about meal planning, she reported none. She has not had a previous visit with a dietitian. She participates in exercise intermittently (has been limited lately due to fatigue). Her home blood glucose trend is increasing steadily. Her breakfast blood glucose range is generally >200 mg/dl. Her overall blood glucose range is >200 mg/dl. (She presents today with her meter and logs showing above target glycemic profile overall.  Her POCT A1c today is 11.2%, increasing from last visit of 9.9%.  She reports she still feels like she hasn't fully recovered from her anemia last year.  She denies any hypoglycemia.  Analysis of her meter shows 7-day average of 263, 14-day average of 283, 30-day average of 294, 90-day average of 272.) An ACE inhibitor/angiotensin II receptor blocker is being taken. She does not see a podiatrist.Eye exam is current.  Hyperlipidemia This is a chronic problem. The current episode started more than 1 year ago. The problem is  controlled. Recent lipid tests were reviewed and are normal. Exacerbating diseases include chronic renal disease, diabetes, hypothyroidism and obesity. There are no known factors aggravating her hyperlipidemia. Pertinent negatives include no myalgias. Current antihyperlipidemic treatment includes statins and exercise. The current treatment provides moderate improvement of lipids. There are no compliance problems.  Risk factors for coronary artery disease include dyslipidemia, diabetes mellitus, hypertension, obesity, a sedentary lifestyle, family history and post-menopausal.  Thyroid Problem Presents for follow-up visit. Symptoms include fatigue. Patient reports no anxiety, cold intolerance, constipation, depressed mood, diarrhea, hair loss, heat intolerance, leg swelling, palpitations, tremors, weight gain or weight loss. The symptoms have been stable (She was given I-131 therapy for hyperthyroidism on May 16, 2018.). Her past medical history is significant for diabetes and hyperlipidemia.    Review of systems  Constitutional: + fluctuating body weight,  current Body mass index is 45.16 kg/m. ,+ fatigue, no subjective hyperthermia, no subjective hypothermia Eyes: no blurry vision, no xerophthalmia ENT: no sore throat, no nodules palpated in throat,  no dysphagia/odynophagia, no hoarseness Cardiovascular: no chest pain, no shortness of breath, no palpitations, no leg swelling Respiratory: no cough, no shortness of breath Gastrointestinal: no nausea/vomiting/diarrhea Musculoskeletal: no muscle/joint aches Skin: no rashes, no hyperemia Neurological: no tremors, no dizziness, + numbness/tingling to BLE with pins and needles in toes (worse at night)-stable on Lyrica Psychiatric: + depression/anxiety -controlled   Objective:    BP 128/71   Pulse 81   Ht '5\' 8"'  (1.727 m)   Wt 297 lb (134.7 kg)   BMI 45.16 kg/m   Wt Readings from Last 3 Encounters:  05/28/22 297 lb (134.7 kg)  02/19/22 287 lb  6.4 oz (130.4 kg)  10/22/21 291 lb (132 kg)    BP Readings from Last 3 Encounters:  05/28/22 128/71  02/19/22 116/74  10/22/21 (!) 145/83     Physical Exam- Limited  Constitutional:  Body mass index is 45.16 kg/m. , not in acute distress, normal state of mind Eyes:  EOMI, no exophthalmos Neck: Supple Cardiovascular: RRR, no murmurs, rubs, or gallops, no edema Respiratory: Adequate breathing efforts, no crackles, rales, rhonchi, or wheezing Musculoskeletal: no gross deformities, strength intact in all four extremities, no gross restriction of joint movements Skin:  no rashes, no hyperemia Neurological: no tremor with outstretched hands     Recent Results (from the past 2160 hour(s))  TSH     Status: None   Collection Time: 05/19/22  1:03 PM  Result Value Ref Range   TSH 4.290 0.450 - 4.500 uIU/mL  T4, free     Status: None   Collection Time: 05/19/22  1:03 PM  Result Value Ref Range   Free T4 1.12 0.82 - 1.77 ng/dL  HgB A1c     Status: Abnormal   Collection Time: 05/28/22  1:49 PM  Result Value Ref Range   Hemoglobin A1C     HbA1c POC (<> result, manual entry) 11.2 4.0 - 5.6 %   HbA1c, POC (prediabetic range)     HbA1c, POC (controlled diabetic range)      Lipid Panel     Component Value Date/Time   CHOL 142 07/09/2021 1016   TRIG 96 07/09/2021 1016   HDL 42 07/09/2021 1016   CHOLHDL 3.4 07/09/2021 1016   CHOLHDL 3.1 04/17/2020 0803   LDLCALC 82 07/09/2021 1016   LDLCALC 56 04/17/2020 0803     Assessment & Plan:   1) Type 2 diabetes mellitus with complication, with long-term current use of insulin (HCC)  - Patient has currently controlled type 2 DM since 66 years of age.  She presents today with her meter and logs showing above target glycemic profile overall.  Her POCT A1c today is 11.2%, increasing from last visit of 9.9%.  She reports she still feels like she hasn't fully recovered from her anemia last year.  She denies any hypoglycemia.  Analysis of  her meter shows 7-day average of 263, 14-day average of 283, 30-day average of 294, 90-day average of 272.   Recent labs reviewed.   Her diabetes is complicated by obesity/sedentary life, peripheral neuropathy and patient remains at a high risk for more acute and chronic complications which include CAD, CVA, CKD, retinopathy, and neuropathy. These are all discussed in detail with the patient.  - Nutritional counseling repeated at each appointment due to patients tendency to fall back in to old habits.  - The patient admits there is a room for improvement in their diet and drink choices. -  Suggestion is made for the patient  to avoid simple carbohydrates from their diet including Cakes, Sweet Desserts / Pastries, Ice Cream, Soda (diet and regular), Sweet Tea, Candies, Chips, Cookies, Sweet Pastries, Store Bought Juices, Alcohol in Excess of 1-2 drinks a day, Artificial Sweeteners, Coffee Creamer, and "Sugar-free" Products. This will help patient to have stable blood glucose profile and potentially avoid unintended weight gain.   - I encouraged the patient to switch to unprocessed or minimally processed complex starch and increased protein intake (animal or plant source), fruits, and vegetables.   - Patient is advised to stick to a routine mealtimes to eat 3 meals a day and avoid unnecessary snacks (to snack only to correct hypoglycemia).  - I have approached patient with the following individualized plan to manage diabetes and patient agrees:   -She is advised to continue her Lantus 75 units SQ nightly and continue her Metformin 1000 mg po twice daily with meals.  I increased her Mounjaro to 7.5 mg SQ weekly (can start this once she is finished with her current 5 mg supply).    -She is encouraged to continue monitoring blood glucose at least twice daily, before meals and at bedtime and call the clinic if readings are less than 70 or greater than 200 for 3 tests in a row.   She could benefit from  CGM device, I sent in for Dexcom G7 to [facility] for her.  She was switched to Lyrica between visits for better control of her neuropathy and it has helped greatly- refilled this for her today.  - Patient specific target  A1c;  LDL, HDL, Triglycerides were discussed in detail.  2) Lipids/HPL: Her most recent lipid panel from 07/09/21 shows controlled LDL of 82.  She is advised to continue Pravastatin 20 mg po daily at bedtime.  Side effects and precautions discussed with her.  3) Hypertension:  Her blood pressure is controlled to target.  She is advised to continue Losartan 100 mg po daily.    4) RAI induced hypothyroidism   -She is status post ablative therapy with I-131 for hyperthyroidism on May 16, 2018.    -Her previsit thyroid function tests show slight under-replacement.  I discussed and increased her Levothyroxine to 88 mcg po daily before breakfast.  Will recheck TFTs prior to next visit and adjust dose as needed.   - We discussed about the correct intake of her thyroid hormone, on empty stomach at fasting, with water, separated by at least 30 minutes from breakfast and other medications,  and separated by more than 4 hours from calcium, iron, multivitamins, acid reflux medications (PPIs). -Patient is made aware of the fact that thyroid hormone replacement is needed for life, dose to be adjusted by periodic monitoring of thyroid function tests.  5) Weight management:  Her Body mass index is 45.16 kg/m.-clearly complicating her diabetes care. She is a candidate for modest weight loss.  Carbs education was provided to patient-see above. She has recently lost 19 lbs.  We discussed bariatric surgery at last visit but she never followed up with them.  - I advised patient to maintain close follow up with Stephens Shire, MD for primary care needs.     I spent 40 minutes in the care of the patient today including review of labs from [place], Lipids, Thyroid Function, Hematology (current  and previous including abstractions from other facilities); face-to-face time discussing  her blood glucose readings/logs, discussing hypoglycemia and hyperglycemia episodes and symptoms, medications doses, her options of short and long term treatment  based on the latest standards of care / guidelines;  discussion about incorporating lifestyle medicine;  and documenting the encounter. Risk reduction counseling performed per USPSTF guidelines to reduce obesity and cardiovascular risk factors.     Please refer to Patient Instructions for Blood Glucose Monitoring and Insulin/Medications Dosing Guide"  in media tab for additional information. Please  also refer to " Patient Self Inventory" in the Media  tab for reviewed elements of pertinent patient history.  [name] participated in the discussions, expressed understanding, and voiced agreement with the above plans.  All questions were answered to her satisfaction. she is encouraged to contact clinic should she have any questions or concerns prior to her return visit.    Follow up plan: - Return in about 3 months (around 08/28/2022) for Diabetes F/U with A1c in office, Thyroid follow up, Previsit labs, Bring meter and logs.  Rayetta Pigg, [employer] [facility] [address] [place], [place] [address] Phone: [phone] Fax: [phone]  05/28/2022, 3:41 PM

## 2022-06-03 ENCOUNTER — Ambulatory Visit: Payer: Medicare HMO

## 2022-06-04 ENCOUNTER — Ambulatory Visit (INDEPENDENT_AMBULATORY_CARE_PROVIDER_SITE_OTHER): Payer: Medicare HMO | Admitting: Nurse Practitioner

## 2022-06-04 DIAGNOSIS — E118 Type 2 diabetes mellitus with unspecified complications: Secondary | ICD-10-CM | POA: Diagnosis not present

## 2022-06-04 DIAGNOSIS — Z794 Long term (current) use of insulin: Secondary | ICD-10-CM

## 2022-06-04 NOTE — Progress Notes (Signed)
Discussed and demonstrated set up of [place] receiver and pairing with G7 sensor. Reviewed and demonstrated application of Dexcom G7 sensor. Applied Dexcom G7 sensor to upper L arm without difficulty.Answered all pt questions, understanding voiced. Advised pt if she had any further questions to please contact the office, understanding voiced.

## 2022-06-25 ENCOUNTER — Other Ambulatory Visit: Payer: Self-pay | Admitting: Nurse Practitioner

## 2022-07-09 ENCOUNTER — Telehealth: Payer: Self-pay | Admitting: Nurse Practitioner

## 2022-07-09 NOTE — Telephone Encounter (Signed)
Has anything changed recently? She taking her medications?  On steroids or been sick recently?  Either way she needs more insulin now to help bring it back down.  Have her increase her Lantus to 85 units nightly.  We may need to consider prandial insulin if we cant get control.  Remind her to eat 3 balanced meals, avoid sugary drinks and concentrated sweets, and exercise when able.

## 2022-07-09 NOTE — Telephone Encounter (Signed)
New message    Patient calling regarding DM reading asking for a call back to discuss   8/13 276 in am  8/13 249 in pm at bedtime  8/14 334 in am  8/14 374 in pm at bedtime   8/15 255 in am  8/15 268 in pm at bedtime

## 2022-07-09 NOTE — Telephone Encounter (Signed)
Pt states she isn't doing anything new. No changes. She will start the 85units and let us know if this helps

## 2022-08-06 ENCOUNTER — Telehealth: Payer: Self-pay | Admitting: Nurse Practitioner

## 2022-08-06 NOTE — Telephone Encounter (Signed)
I saw a similar note where we increased her to 85 units of Lantus nightly.  She will need some meal time insulin to bring that down and help manage her glucose moving forward.  Can she come in for a visit tomorrow or next week?

## 2022-08-06 NOTE — Telephone Encounter (Signed)
Pt called with high BG readings.   Date Before breakfast Before lunch Before supper Bedtime  9/12 369 No reading No reading No reading  9/13 267 No reading No reading 371  9/14 337 379            Pt taking: This morning she only took 1000 mg of metformin.

## 2022-08-11 ENCOUNTER — Ambulatory Visit: Payer: Medicare HMO | Admitting: Nurse Practitioner

## 2022-08-11 ENCOUNTER — Encounter: Payer: Self-pay | Admitting: Nurse Practitioner

## 2022-08-11 VITALS — BP 112/66 | HR 103 | Ht 68.0 in | Wt 297.0 lb

## 2022-08-11 DIAGNOSIS — E118 Type 2 diabetes mellitus with unspecified complications: Secondary | ICD-10-CM | POA: Diagnosis not present

## 2022-08-11 DIAGNOSIS — Z794 Long term (current) use of insulin: Secondary | ICD-10-CM

## 2022-08-11 DIAGNOSIS — I10 Essential (primary) hypertension: Secondary | ICD-10-CM

## 2022-08-11 DIAGNOSIS — E782 Mixed hyperlipidemia: Secondary | ICD-10-CM | POA: Diagnosis not present

## 2022-08-11 DIAGNOSIS — E559 Vitamin D deficiency, unspecified: Secondary | ICD-10-CM

## 2022-08-11 DIAGNOSIS — E890 Postprocedural hypothyroidism: Secondary | ICD-10-CM

## 2022-08-11 MED ORDER — TIRZEPATIDE 10 MG/0.5ML ~~LOC~~ SOAJ: 10.0000 mg | SUBCUTANEOUS | 1 refills | Status: DC

## 2022-08-11 MED ORDER — NOVOLOG FLEXPEN 100 UNIT/ML ~~LOC~~ SOPN: 8.0000 [IU] | PEN_INJECTOR | Freq: Three times a day (TID) | SUBCUTANEOUS | 3 refills | Status: DC

## 2022-08-11 MED ORDER — LANTUS SOLOSTAR 100 UNIT/ML ~~LOC~~ SOPN
60.0000 [IU] | PEN_INJECTOR | Freq: Every day | SUBCUTANEOUS | 0 refills | Status: DC
Start: 2022-08-11 — End: 2023-03-05

## 2022-08-11 NOTE — Progress Notes (Signed)
08/11/2022               Endocrinology follow-up note   Subjective:    Patient ID: [name], female    DOB: [birth year].  She is returning for follow-up for management of type 2 diabetes, RAI induced hypothyroidism, hyperlipidemia.  PMD:   Stephens Shire, MD  Past Medical History:  Diagnosis Date   Anxiety    Coronary atherosclerosis    Cardiac CT 09/2018 with calcium score 8.7 and mild proximal LAD disease   Essential hypertension 2009   GERD (gastroesophageal reflux disease)    Iron deficiency anemia    Osteoarthritis    Palpitations    Tachypalpitations on beta blockers since 2010   Type 2 diabetes mellitus ([place]) 2003   Vitamin D deficiency    Past Surgical History:  Procedure Laterality Date   ABDOMINAL HYSTERECTOMY     History of cervical cancer   TONSILLECTOMY     Social History   Socioeconomic History   Marital status: Single    Spouse name: Not on file   Number of children: 2   Years of education: Not on file   Highest education level: Not on file  Occupational History   Occupation: DISABLED    Employer: UNEMPLOYED    Comment: OSTEOARTHRITIS  Tobacco Use   Smoking status: Never   Smokeless tobacco: Never  Vaping Use   Vaping Use: Never used  Substance and Sexual Activity   Alcohol use: No   Drug use: No   Sexual activity: Never  Other Topics Concern   Not on file  Social History Narrative   Has 2 grandchildren. 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 Use up to four times daily as directed. (FOR ICD-10 E11.65)   Cholecalciferol  (VITAMIN D3) 5000 units TABS Take 1 tablet by mouth daily.    COLLAGEN PO Take 5,000 Int'l Units/day by mouth.   cyclobenzaprine (FLEXERIL) 10 MG tablet Take 10 mg by mouth 3 (three) times daily.   DROPLET PEN NEEDLES 31G X 5 MM MISC USE AS INSTRUCTED TO INJECT INSULIN DAILY   ferrous sulfate 325 (65 FE) MG EC tablet Take 325 mg by mouth daily.   FLUoxetine (PROZAC) 20 MG capsule Take 20 mg by mouth daily.   glucose blood (TRUE METRIX BLOOD GLUCOSE TEST) test strip TEST BLOOD SUGAR TWICE DAILY   insulin aspart (NOVOLOG FLEXPEN) 100 UNIT/ML FlexPen Inject 8-14 Units into the skin 3 (three) times daily with meals.   levothyroxine (SYNTHROID) 88 MCG tablet Take 1 tablet (88 mcg total) by mouth daily before breakfast.   losartan (COZAAR) 100 MG tablet Take 100 mg by mouth daily.   magnesium oxide (MAG-OX) 400 MG tablet Take 800 mg by mouth at bedtime.   metFORMIN (GLUCOPHAGE) 1000 MG tablet TAKE 1 TABLET TWICE DAILY   pantoprazole (PROTONIX) 40 MG tablet Take 40 mg by mouth daily.     pregabalin (LYRICA) 100 MG capsule Take 1 capsule (100 mg total) by mouth 2 (two) times daily.   tirzepatide [facility]) 10 MG/0.5ML Pen  Inject 10 mg into the skin once a week.   [DISCONTINUED] LANTUS SOLOSTAR 100 UNIT/ML Solostar Pen INJECT 75 UNITS INTO THE SKIN AT BEDTIME.   [DISCONTINUED] MOUNJARO 7.5 MG/0.5ML Pen INJECT 7.5MG (1 PEN) UNDER THE SKIN EVERY WEEK   insulin glargine (LANTUS SOLOSTAR) 100 UNIT/ML Solostar Pen Inject 60 Units into the skin at bedtime.   No facility-administered encounter medications on file as of 08/11/2022.   ALLERGIES: Allergies  Allergen Reactions   Contrast Media [Iodinated Contrast Media]    Sulfa Antibiotics Nausea And Vomiting    Other reaction(s): GI Upset (intolerance) unknown   Doxepin Rash and Swelling   VACCINATION STATUS: Immunization History  Administered Date(s) Administered   Tdap 11/24/2007    Diabetes She presents for her follow-up diabetic visit. She has  type 2 diabetes mellitus. Onset time: She was diagnosed at approximate age of 8 years. Her disease course has been worsening. There are no hypoglycemic associated symptoms. Pertinent negatives for hypoglycemia include no confusion, nervousness/anxiousness, pallor, seizures or tremors. Associated symptoms include fatigue and foot paresthesias. Pertinent negatives for diabetes include no polydipsia, no polyphagia, no polyuria and no weight loss. There are no hypoglycemic complications. Symptoms are stable. Diabetic complications include heart disease and peripheral neuropathy. Risk factors for coronary artery disease include diabetes mellitus, dyslipidemia, hypertension, obesity, family history, sedentary lifestyle, post-menopausal and stress. Current diabetic treatment includes insulin injections and oral agent (monotherapy) (and Mounjaro). She is compliant with treatment all of the time. Her weight is fluctuating minimally. She is following a generally healthy diet. When asked about meal planning, she reported none. She has not had a previous visit with a dietitian. She participates in exercise intermittently (has been limited lately due to fatigue). Her home blood glucose trend is increasing steadily. Her breakfast blood glucose range is generally >200 mg/dl. Her lunch blood glucose range is generally >200 mg/dl. Her dinner blood glucose range is generally >200 mg/dl. Her bedtime blood glucose range is generally >200 mg/dl. Her overall blood glucose range is >200 mg/dl. (She presents today with her CGM and meter showing gross hyperglycemia overall.  She was not due for another A1c today.  Analysis of her CGM shows TIR <1%, TAR 99%, TBR 0% with GMI of 11.3%.  She notes she has been under more stress here lately but otherwise has been eating the same meals as before, has not had any steroids, has not been ill since last visit.  ) An ACE inhibitor/angiotensin II receptor blocker is being taken. She does not see a  podiatrist.Eye exam is current.  Hyperlipidemia This is a chronic problem. The current episode started more than 1 year ago. The problem is controlled. Recent lipid tests were reviewed and are normal. Exacerbating diseases include chronic renal disease, diabetes, hypothyroidism and obesity. There are no known factors aggravating her hyperlipidemia. Pertinent negatives include no myalgias. Current antihyperlipidemic treatment includes statins and exercise. The current treatment provides moderate improvement of lipids. There are no compliance problems.  Risk factors for coronary artery disease include dyslipidemia, diabetes mellitus, hypertension, obesity, a sedentary lifestyle, family history and post-menopausal.  Thyroid Problem Presents for follow-up visit. Symptoms include fatigue. Patient reports no anxiety, cold intolerance, constipation, depressed mood, diarrhea, hair loss, heat intolerance, leg swelling, palpitations, tremors, weight gain or weight loss. The symptoms have been stable (She was given I-131 therapy for hyperthyroidism on May 16, 2018.). Her past medical history is significant for diabetes and hyperlipidemia.    Review of systems  Constitutional: + fluctuating body weight,  current Body mass index is 45.16 kg/m. ,+ fatigue, no subjective hyperthermia, no subjective hypothermia Eyes: no blurry vision, no xerophthalmia ENT: no sore throat, no nodules palpated in throat, no dysphagia/odynophagia, no hoarseness Cardiovascular: no chest pain, no shortness of breath, no palpitations, no leg swelling Respiratory: no cough, no shortness of breath Gastrointestinal: no nausea/vomiting/diarrhea Musculoskeletal: no muscle/joint aches Skin: no rashes, no hyperemia Neurological: no tremors, no dizziness, + numbness/tingling to BLE with pins and needles in toes (worse at night)-stable on Lyrica Psychiatric: + depression/anxiety -controlled   Objective:    BP 112/66 (BP Location: Left  Arm, Patient Position: Sitting, Cuff Size: Large)   Pulse (!) 103   Ht '5\' 8"'  (1.727 m)   Wt 297 lb (134.7 kg)   BMI 45.16 kg/m   Wt Readings from Last 3 Encounters:  08/11/22 297 lb (134.7 kg)  05/28/22 297 lb (134.7 kg)  02/19/22 287 lb 6.4 oz (130.4 kg)    BP Readings from Last 3 Encounters:  08/11/22 112/66  05/28/22 128/71  02/19/22 116/74     Physical Exam- Limited  Constitutional:  Body mass index is 45.16 kg/m. , not in acute distress, normal state of mind Eyes:  EOMI, no exophthalmos Neck: Supple Cardiovascular: RRR, no murmurs, rubs, or gallops, no edema Respiratory: Adequate breathing efforts, no crackles, rales, rhonchi, or wheezing Musculoskeletal: no gross deformities, strength intact in all four extremities, no gross restriction of joint movements Skin:  no rashes, no hyperemia Neurological: no tremor with outstretched hands   Diabetic Foot Exam - Simple   Simple Foot Form Diabetic Foot exam was performed with the following findings: Yes 08/11/2022 10:17 AM  Visual Inspection No deformities, no ulcerations, no other skin breakdown bilaterally: Yes Sensation Testing See comments: Yes Pulse Check Posterior Tibialis and Dorsalis pulse intact bilaterally: Yes Comments Decreased sensation to monofilament tool on left foot, normal on right     Recent Results (from the past 2160 hour(s))  TSH     Status: None   Collection Time: 05/19/22  1:03 PM  Result Value Ref Range   TSH 4.290 0.450 - 4.500 uIU/mL  T4, free     Status: None   Collection Time: 05/19/22  1:03 PM  Result Value Ref Range   Free T4 1.12 0.82 - 1.77 ng/dL  HgB A1c     Status: Abnormal   Collection Time: 05/28/22  1:49 PM  Result Value Ref Range   Hemoglobin A1C     HbA1c POC (<> result, manual entry) 11.2 4.0 - 5.6 %   HbA1c, POC (prediabetic range)     HbA1c, POC (controlled diabetic range)      Lipid Panel     Component Value Date/Time   CHOL 142 07/09/2021 1016   TRIG 96  07/09/2021 1016   HDL 42 07/09/2021 1016   CHOLHDL 3.4 07/09/2021 1016   CHOLHDL 3.1 04/17/2020 0803   LDLCALC 82 07/09/2021 1016   LDLCALC 56 04/17/2020 0803     Assessment & Plan:   1) Type 2 diabetes mellitus with complication, with long-term current use of insulin (HCC)  - Patient has currently controlled type 2 DM since 66 years of age.  She presents today with her CGM and meter showing gross hyperglycemia overall.  She was not due for another A1c today.  Analysis of her CGM shows TIR <1%, TAR 99%, TBR 0% with GMI of 11.3%.  She notes she has been under more stress here lately but otherwise has been eating the same  meals as before, has not had any steroids, has not been ill since last visit.     Recent labs reviewed.   Her diabetes is complicated by obesity/sedentary life, peripheral neuropathy and patient remains at a high risk for more acute and chronic complications which include CAD, CVA, CKD, retinopathy, and neuropathy. These are all discussed in detail with the patient.  - Nutritional counseling repeated at each appointment due to patients tendency to fall back in to old habits.  - The patient admits there is a room for improvement in their diet and drink choices. -  Suggestion is made for the patient to avoid simple carbohydrates from their diet including Cakes, Sweet Desserts / Pastries, Ice Cream, Soda (diet and regular), Sweet Tea, Candies, Chips, Cookies, Sweet Pastries, Store Bought Juices, Alcohol in Excess of 1-2 drinks a day, Artificial Sweeteners, Coffee Creamer, and "Sugar-free" Products. This will help patient to have stable blood glucose profile and potentially avoid unintended weight gain.   - I encouraged the patient to switch to unprocessed or minimally processed complex starch and increased protein intake (animal or plant source), fruits, and vegetables.   - Patient is advised to stick to a routine mealtimes to eat 3 meals a day and avoid unnecessary snacks  (to snack only to correct hypoglycemia).  - I have approached patient with the following individualized plan to manage diabetes and patient agrees:   -Due to her gross hyperglycemia, I discussed and initiated bolus insulin to help bring her back down to goal.  She is advised to lower her Lantus to 60 units SQ nightly and start Novolog 8-14 units TID with meals if glucose is above 90 and eating (Specific instructions on how to titrate insulin dosage based on glucose readings given to patient in writing).  She demonstrated her ability to properly use the SSI chart with me today to dose her meal time insulin.  She can continue her Metformin 1000 mg po twice daily with meals.  I did also increase her Mounjaro to 10 mg SQ weekly.   -She is encouraged to continue monitoring blood glucose 4 times daily, before meals and at bedtime and call the clinic if readings are less than 70 or greater than 200 for 3 tests in a row.     - Patient specific target  A1c;  LDL, HDL, Triglycerides were discussed in detail.  2) Lipids/HPL: Her most recent lipid panel from 07/09/21 shows controlled LDL of 82.  She is advised to continue Pravastatin 20 mg po daily at bedtime.  Side effects and precautions discussed with her.  Will recheck lipid panel prior to next visit.  3) Hypertension:  Her blood pressure is controlled to target.  She is advised to continue Losartan 100 mg po daily.    4) RAI induced hypothyroidism   -She is status post ablative therapy with I-131 for hyperthyroidism on May 16, 2018.    -There are no recent TFTs to review.  She is advised to continue Levothyroxine 88 mcg po daily before breakfast.  Will recheck TFTs prior to next visit and adjust dose as needed.   - We discussed about the correct intake of her thyroid hormone, on empty stomach at fasting, with water, separated by at least 30 minutes from breakfast and other medications,  and separated by more than 4 hours from calcium, iron,  multivitamins, acid reflux medications (PPIs). -Patient is made aware of the fact that thyroid hormone replacement is needed for life, dose to be adjusted  by periodic monitoring of thyroid function tests.  5) Weight management:  Her Body mass index is 45.16 kg/m.-clearly complicating her diabetes care. She is a candidate for modest weight loss.  Carbs education was provided to patient-see above. She has recently lost 19 lbs.  We discussed bariatric surgery at last visit but she never followed up with them.  - I advised patient to maintain close follow up with Stephens Shire, MD for primary care needs.      I spent 47 minutes in the care of the patient today including review of labs from [place], Lipids, Thyroid Function, Hematology (current and previous including abstractions from other facilities); face-to-face time discussing  her blood glucose readings/logs, discussing hypoglycemia and hyperglycemia episodes and symptoms, medications doses, her options of short and long term treatment based on the latest standards of care / guidelines;  discussion about incorporating lifestyle medicine;  and documenting the encounter. Risk reduction counseling performed per USPSTF guidelines to reduce obesity and cardiovascular risk factors.     Please refer to Patient Instructions for Blood Glucose Monitoring and Insulin/Medications Dosing Guide"  in media tab for additional information. Please  also refer to " Patient Self Inventory" in the Media  tab for reviewed elements of pertinent patient history.  Kynlei E Abella participated in the discussions, expressed understanding, and voiced agreement with the above plans.  All questions were answered to her satisfaction. she is encouraged to contact clinic should she have any questions or concerns prior to her return visit.    Follow up plan: - Return in about 1 month (around 09/10/2022) for Diabetes F/U, Bring meter and logs.  Rayetta Pigg,  [facility] [address] [place], [place] [address] Phone: [phone] Fax: [phone]  08/11/2022, 12:05 PM

## 2022-08-20 ENCOUNTER — Telehealth: Payer: Self-pay | Admitting: Nurse Practitioner

## 2022-08-20 NOTE — Telephone Encounter (Signed)
Have you ever heard of this type of CGM?  I think I would rather try Libre 3 before switching to one I don't know how to troubleshoot.  And seriously, what is going on with [facility]?!  They arent contracted with hardly any DME supply companies any more.

## 2022-08-20 NOTE — Telephone Encounter (Signed)
I think the pt may be talking about a DME company.

## 2022-08-20 NOTE — Telephone Encounter (Signed)
[employer] no longer works with [employer]. They do associate with [employer]. She said to call [phone] and set this up for her  [phone] [facility]

## 2022-08-21 MED ORDER — DEXCOM G7 SENSOR MISC: 1.0000 | 3 refills | Status: DC

## 2022-08-21 NOTE — Telephone Encounter (Signed)
Patient was called and made aware that a Rx had been sent to [facility].

## 2022-08-21 NOTE — Telephone Encounter (Signed)
Since we are having such trouble with DME companies contracted with [employer], I just sent her script for Dexcom G7 to [facility].  They can figure out where it will be covered and cheaper for her.

## 2022-08-27 ENCOUNTER — Telehealth: Payer: Self-pay | Admitting: Nurse Practitioner

## 2022-08-27 NOTE — Telephone Encounter (Signed)
Pt made aware

## 2022-08-27 NOTE — Telephone Encounter (Signed)
Called pt to let her know she does not have to be seen tomorrow since she came in sooner for insulin. But she needs to keep her 10/19 appt and do her labs. VM was not set up

## 2022-08-28 ENCOUNTER — Ambulatory Visit: Payer: Medicare HMO | Admitting: Nurse Practitioner

## 2022-09-01 ENCOUNTER — Other Ambulatory Visit: Payer: Self-pay | Admitting: Nurse Practitioner

## 2022-09-01 DIAGNOSIS — Z794 Long term (current) use of insulin: Secondary | ICD-10-CM

## 2022-09-04 LAB — LIPID PANEL
Chol/HDL Ratio: 3.3 ratio (ref 0.0–4.4)
Cholesterol, Total: 135 mg/dL (ref 100–199)
HDL: 41 mg/dL (ref >39)
LDL Chol Calc (NIH): 76 mg/dL (ref 0–99)
Triglycerides: 95 mg/dL (ref 0–149)
VLDL Cholesterol Cal: 18 mg/dL (ref 5–40)

## 2022-09-04 LAB — COMPREHENSIVE METABOLIC PANEL
ALT: 18 IU/L (ref 0–32)
AST: 49 IU/L — ABNORMAL HIGH (ref 0–40)
Albumin/Globulin Ratio: 1.3 (ref 1.2–2.2)
Albumin: 3.7 g/dL — ABNORMAL LOW (ref 3.9–4.9)
Alkaline Phosphatase: 62 IU/L (ref 44–121)
BUN/Creatinine Ratio: 9 — ABNORMAL LOW (ref 12–28)
BUN: 8 mg/dL (ref 8–27)
Bilirubin Total: 0.3 mg/dL (ref 0.0–1.2)
CO2: 25 mmol/L (ref 20–29)
Calcium: 9.4 mg/dL (ref 8.7–10.3)
Chloride: 101 mmol/L (ref 96–106)
Creatinine, Ser: 0.86 mg/dL (ref 0.57–1.00)
Globulin, Total: 2.9 g/dL (ref 1.5–4.5)
Glucose: 183 mg/dL — ABNORMAL HIGH (ref 70–99)
Potassium: 4.7 mmol/L (ref 3.5–5.2)
Sodium: 139 mmol/L (ref 134–144)
Total Protein: 6.6 g/dL (ref 6.0–8.5)
eGFR: 75 mL/min/{1.73_m2} (ref >59)

## 2022-09-04 LAB — T4, FREE: Free T4: 1.23 ng/dL (ref 0.82–1.77)

## 2022-09-04 LAB — TSH: TSH: 3.12 u[IU]/mL (ref 0.450–4.500)

## 2022-09-10 ENCOUNTER — Ambulatory Visit: Payer: Medicare HMO | Admitting: Nurse Practitioner

## 2022-09-10 ENCOUNTER — Encounter: Payer: Self-pay | Admitting: Nurse Practitioner

## 2022-09-10 VITALS — BP 116/80 | HR 106 | Ht 68.0 in | Wt 300.2 lb

## 2022-09-10 DIAGNOSIS — E782 Mixed hyperlipidemia: Secondary | ICD-10-CM | POA: Diagnosis not present

## 2022-09-10 DIAGNOSIS — E118 Type 2 diabetes mellitus with unspecified complications: Secondary | ICD-10-CM

## 2022-09-10 DIAGNOSIS — E890 Postprocedural hypothyroidism: Secondary | ICD-10-CM | POA: Diagnosis not present

## 2022-09-10 DIAGNOSIS — Z794 Long term (current) use of insulin: Secondary | ICD-10-CM | POA: Diagnosis not present

## 2022-09-10 DIAGNOSIS — I10 Essential (primary) hypertension: Secondary | ICD-10-CM

## 2022-09-10 DIAGNOSIS — E559 Vitamin D deficiency, unspecified: Secondary | ICD-10-CM

## 2022-09-10 LAB — POCT GLYCOSYLATED HEMOGLOBIN (HGB A1C): Hemoglobin A1C: 8.8 % — AB (ref 4.0–5.6)

## 2022-09-10 NOTE — Progress Notes (Signed)
09/10/2022               Endocrinology follow-up note   Subjective:    Patient ID: [name], female    DOB: 1956-08-31.  She is returning for follow-up for management of type 2 diabetes, RAI induced hypothyroidism, hyperlipidemia.  PMD:   Stephens Shire, MD  Past Medical History:  Diagnosis Date   Anxiety    Coronary atherosclerosis    Cardiac CT 09/2018 with calcium score 8.7 and mild proximal LAD disease   Essential hypertension 2009   GERD (gastroesophageal reflux disease)    Iron deficiency anemia    Osteoarthritis    Palpitations    Tachypalpitations on beta blockers since 2010   Type 2 diabetes mellitus ([place]) 2003   Vitamin D deficiency    Past Surgical History:  Procedure Laterality Date   ABDOMINAL HYSTERECTOMY     History of cervical cancer   TONSILLECTOMY     Social History   Socioeconomic History   Marital status: Single    Spouse name: Not on file   Number of children: 2   Years of education: Not on file   Highest education level: Not on file  Occupational History   Occupation: DISABLED    Employer: UNEMPLOYED    Comment: OSTEOARTHRITIS  Tobacco Use   Smoking status: Never   Smokeless tobacco: Never  Vaping Use   Vaping Use: Never used  Substance and Sexual Activity   Alcohol use: No   Drug use: No   Sexual activity: Never  Other Topics Concern   Not on file  Social History Narrative   Has 2 grandchildren. 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    cyclobenzaprine (FLEXERIL) 10 MG tablet Take 10 mg by mouth 3 (three) times daily.   DROPLET PEN  NEEDLES 31G X 5 MM MISC USE AS INSTRUCTED TO INJECT INSULIN DAILY   ferrous sulfate 325 (65 FE) MG EC tablet Take 325 mg by mouth daily.   FLUoxetine (PROZAC) 20 MG capsule Take 20 mg by mouth daily.   glucose blood (TRUE METRIX BLOOD GLUCOSE TEST) test strip TEST BLOOD SUGAR TWICE DAILY   insulin aspart (NOVOLOG FLEXPEN) 100 UNIT/ML FlexPen Inject 8-14 Units into the skin 3 (three) times daily with meals.   insulin glargine (LANTUS SOLOSTAR) 100 UNIT/ML Solostar Pen Inject 60 Units into the skin at bedtime.   levothyroxine (SYNTHROID) 88 MCG tablet Take 1 tablet (88 mcg total) by mouth daily before breakfast.   losartan (COZAAR) 100 MG tablet Take 100 mg by mouth daily.   magnesium oxide (MAG-OX) 400 MG tablet Take 800 mg by mouth at bedtime.   metFORMIN (GLUCOPHAGE) 1000 MG tablet Take 1 tablet (1,000 mg total) by mouth 2 (two) times daily.   pantoprazole (PROTONIX) 40 MG tablet Take 40 mg by mouth daily.     pregabalin (LYRICA) 100 MG capsule Take 1 capsule (100 mg total) by mouth 2 (two) times daily.   tirzepatide (MOUNJARO) 10 MG/0.5ML Pen Inject 10 mg into the skin once a week.   blood  glucose meter kit and supplies KIT Dispense based on patient and insurance preference. Use up to four times daily as directed. (FOR ICD-10 E11.65)   Continuous Blood Gluc Sensor (DEXCOM G7 SENSOR) MISC Inject 1 Application into the skin as directed. Change sensor every 10 days as directed.   [DISCONTINUED] COLLAGEN PO Take 5,000 Int'l Units/day by mouth.   No facility-administered encounter medications on file as of 09/10/2022.   ALLERGIES: Allergies  Allergen Reactions   Contrast Media [Iodinated Contrast Media]    Sulfa Antibiotics Nausea And Vomiting    Other reaction(s): GI Upset (intolerance) unknown   Doxepin Rash and Swelling   VACCINATION STATUS: Immunization History  Administered Date(s) Administered   Tdap 11/24/2007    Diabetes She presents for her follow-up diabetic visit. She has  type 2 diabetes mellitus. Onset time: She was diagnosed at approximate age of 53 years. Her disease course has been improving. There are no hypoglycemic associated symptoms. Pertinent negatives for hypoglycemia include no confusion, nervousness/anxiousness, pallor, seizures or tremors. Associated symptoms include fatigue and foot paresthesias. Pertinent negatives for diabetes include no polydipsia, no polyphagia, no polyuria and no weight loss. There are no hypoglycemic complications. Symptoms are stable. Diabetic complications include heart disease and peripheral neuropathy. Risk factors for coronary artery disease include diabetes mellitus, dyslipidemia, hypertension, obesity, family history, sedentary lifestyle, post-menopausal and stress. Current diabetic treatment includes insulin injections and oral agent (monotherapy) (and Mounjaro). She is compliant with treatment all of the time. Her weight is fluctuating minimally. She is following a generally healthy diet. When asked about meal planning, she reported none. She has not had a previous visit with a dietitian. She participates in exercise intermittently (has been limited lately due to fatigue). Her home blood glucose trend is decreasing steadily. Her overall blood glucose range is 140-180 mg/dl. (She presents today with her meter and logs showing improved glycemic profile overall.  Her POCT A1c today is 8.8%, improving from last visit of 11.2%.  She has tolerated the addition of prandial insulin well.  Says she should be receiving her CGM soon.  Analysis of her meter shows 7-day average of 165, 14-day average of 184, 30-day average of 187, 90-day average of 210.  She denies any significant hypoglycemia.) An ACE inhibitor/angiotensin II receptor blocker is being taken. She does not see a podiatrist.Eye exam is current.  Hyperlipidemia This is a chronic problem. The current episode started more than 1 year ago. The problem is controlled. Recent lipid tests  were reviewed and are normal. Exacerbating diseases include chronic renal disease, diabetes, hypothyroidism and obesity. There are no known factors aggravating her hyperlipidemia. Pertinent negatives include no myalgias. Current antihyperlipidemic treatment includes statins and exercise. The current treatment provides moderate improvement of lipids. There are no compliance problems.  Risk factors for coronary artery disease include dyslipidemia, diabetes mellitus, hypertension, obesity, a sedentary lifestyle, family history and post-menopausal.  Thyroid Problem Presents for follow-up visit. Symptoms include fatigue. Patient reports no anxiety, cold intolerance, constipation, depressed mood, diarrhea, hair loss, heat intolerance, leg swelling, palpitations, tremors, weight gain or weight loss. The symptoms have been stable (She was given I-131 therapy for hyperthyroidism on May 16, 2018.). Her past medical history is significant for diabetes and hyperlipidemia.    Review of systems  Constitutional: + fluctuating body weight,  current Body mass index is 45.65 kg/m. ,+ fatigue, no subjective hyperthermia, no subjective hypothermia Eyes: no blurry vision, no xerophthalmia ENT: no sore throat, no nodules palpated in throat, no dysphagia/odynophagia, no hoarseness  Cardiovascular: no chest pain, no shortness of breath, no palpitations, no leg swelling Respiratory: no cough, no shortness of breath Gastrointestinal: no nausea/vomiting/diarrhea Musculoskeletal: no muscle/joint aches Skin: no rashes, no hyperemia Neurological: no tremors, no dizziness, + numbness/tingling to BLE with pins and needles in toes (worse at night)-stable on Lyrica Psychiatric: + depression/anxiety -controlled   Objective:    BP 116/80 (BP Location: Right Leg, Patient Position: Sitting, Cuff Size: Large)   Pulse (!) 106   Ht _0  (1.727 m)   Wt (!) 300 lb 3.2 oz (136.2 kg)   BMI 45.65 kg/m   Wt Readings from Last 3  Encounters:  09/10/22 (!) 300 lb 3.2 oz (136.2 kg)  08/11/22 297 lb (134.7 kg)  05/28/22 297 lb (134.7 kg)    BP Readings from Last 3 Encounters:  09/10/22 116/80  08/11/22 112/66  05/28/22 128/71     Physical Exam- Limited  Constitutional:  Body mass index is 45.65 kg/m. , not in acute distress, normal state of mind Eyes:  EOMI, no exophthalmos Neck: Supple Cardiovascular: RRR, no murmurs, rubs, or gallops, no edema Respiratory: Adequate breathing efforts, no crackles, rales, rhonchi, or wheezing Musculoskeletal: no gross deformities, strength intact in all four extremities, no gross restriction of joint movements Skin:  no rashes, no hyperemia Neurological: no tremor with outstretched hands   Diabetic Foot Exam - Simple   No data filed     Recent Results (from the past 2160 hour(s))  Comprehensive metabolic panel     Status: Abnormal   Collection Time: 09/03/22  1:05 PM  Result Value Ref Range   Glucose 183 (H) 70 - 99 mg/dL   BUN 8 8 - 27 mg/dL   Creatinine, Ser 0.86 0.57 - 1.00 mg/dL   eGFR 75 >59 mL/min/1.73   BUN/Creatinine Ratio 9 (L) 12 - 28   Sodium 139 134 - 144 mmol/L   Potassium 4.7 3.5 - 5.2 mmol/L   Chloride 101 96 - 106 mmol/L   CO2 25 20 - 29 mmol/L   Calcium 9.4 8.7 - 10.3 mg/dL   Total Protein 6.6 6.0 - 8.5 g/dL   Albumin 3.7 (L) 3.9 - 4.9 g/dL   Globulin, Total 2.9 1.5 - 4.5 g/dL   Albumin/Globulin Ratio 1.3 1.2 - 2.2   Bilirubin Total 0.3 0.0 - 1.2 mg/dL   Alkaline Phosphatase 62 44 - 121 IU/L   AST 49 (H) 0 - 40 IU/L   ALT 18 0 - 32 IU/L  TSH     Status: None   Collection Time: 09/03/22  1:05 PM  Result Value Ref Range   TSH 3.120 0.450 - 4.500 uIU/mL  T4, free     Status: None   Collection Time: 09/03/22  1:05 PM  Result Value Ref Range   Free T4 1.23 0.82 - 1.77 ng/dL  Lipid panel     Status: None   Collection Time: 09/03/22  1:05 PM  Result Value Ref Range   Cholesterol, Total 135 100 - 199 mg/dL   Triglycerides 95 0 - 149 mg/dL    HDL 41 >39 mg/dL   VLDL Cholesterol Cal 18 5 - 40 mg/dL   LDL Chol Calc (NIH) 76 0 - 99 mg/dL   Chol/HDL Ratio 3.3 0.0 - 4.4 ratio    Comment:                                   T.  Chol/HDL Ratio                                             Men  Women                               1/2 Avg.Risk  3.4    3.3                                   Avg.Risk  5.0    4.4                                2X Avg.Risk  9.6    7.1                                3X Avg.Risk 23.4   11.0   HgB A1c     Status: Abnormal   Collection Time: 09/10/22  9:46 AM  Result Value Ref Range   Hemoglobin A1C 8.8 (A) 4.0 - 5.6 %   HbA1c POC (<> result, manual entry)     HbA1c, POC (prediabetic range)     HbA1c, POC (controlled diabetic range)      Lipid Panel     Component Value Date/Time   CHOL 135 09/03/2022 1305   TRIG 95 09/03/2022 1305   HDL 41 09/03/2022 1305   CHOLHDL 3.3 09/03/2022 1305   CHOLHDL 3.1 04/17/2020 0803   LDLCALC 76 09/03/2022 1305   [place] 56 04/17/2020 0803     Assessment & Plan:   1) Type 2 diabetes mellitus with complication, with long-term current use of insulin ([place])  - Patient has currently controlled type 2 DM since 66 years of age.  She presents today with her meter and logs showing improved glycemic profile overall.  Her POCT A1c today is 8.8%, improving from last visit of 11.2%.  She has tolerated the addition of prandial insulin well.  Says she should be receiving her CGM soon.  Analysis of her meter shows 7-day average of 165, 14-day average of 184, 30-day average of 187, 90-day average of 210.  She denies any significant hypoglycemia.   Recent labs reviewed.   Her diabetes is complicated by obesity/sedentary life, peripheral neuropathy and patient remains at a high risk for more acute and chronic complications which include CAD, CVA, CKD, retinopathy, and neuropathy. These are all discussed in detail with the patient.  - Nutritional counseling repeated at each  appointment due to patients tendency to fall back in to old habits.  - The patient admits there is a room for improvement in their diet and drink choices. -  Suggestion is made for the patient to avoid simple carbohydrates from their diet including Cakes, Sweet Desserts / Pastries, Ice Cream, Soda (diet and regular), Sweet Tea, Candies, Chips, Cookies, Sweet Pastries, Store Bought Juices, Alcohol in Excess of 1-2 drinks a day, Artificial Sweeteners, Coffee Creamer, and "Sugar-free" Products. This will help patient to have stable blood glucose profile and potentially avoid unintended weight gain.   - I encouraged the patient to switch to unprocessed or minimally processed complex starch and increased protein intake (  animal or plant source), fruits, and vegetables.   - Patient is advised to stick to a routine mealtimes to eat 3 meals a day and avoid unnecessary snacks (to snack only to correct hypoglycemia).  - I have approached patient with the following individualized plan to manage diabetes and patient agrees:   -She is advised to continue current regimen of Lantus 60 units SQ nightly and Novolog 8-14 units TID with meals if glucose is above 90 and she is eating (Specific instructions on how to titrate insulin dosage based on glucose readings given to patient in writing).  She can also continue Metformin 1000 mg po twice daily with meals and Mounjaro 10 mg SQ weekly.   -She is encouraged to continue monitoring blood glucose 4 times daily, before meals and at bedtime and call the clinic if readings are less than 70 or greater than 200 for 3 tests in a row.     - Patient specific target  A1c;  LDL, HDL, Triglycerides were discussed in detail.  2) Lipids/HPL: Her most recent lipid panel from 09/03/22 shows controlled LDL of 76.  She is advised to continue Pravastatin 20 mg po daily at bedtime.  Side effects and precautions discussed with her.   3) Hypertension:  Her blood pressure is controlled to  target.  She is advised to continue Losartan 100 mg po daily.    4) RAI induced hypothyroidism   -She is status post ablative therapy with I-131 for hyperthyroidism on May 16, 2018.    -Her previsit thyroid function tests are consistent with appropriate hormone replacement.  She is advised to continue Levothyroxine 88 mcg po daily before breakfast.    - We discussed about the correct intake of her thyroid hormone, on empty stomach at fasting, with water, separated by at least 30 minutes from breakfast and other medications,  and separated by more than 4 hours from calcium, iron, multivitamins, acid reflux medications (PPIs). -Patient is made aware of the fact that thyroid hormone replacement is needed for life, dose to be adjusted by periodic monitoring of thyroid function tests.  5) Weight management:  Her Body mass index is 45.65 kg/m.-clearly complicating her diabetes care. She is a candidate for modest weight loss.  Carbs education was provided to patient-see above. She has recently lost 19 lbs.  We discussed bariatric surgery at last visit but she never followed up with them.  - I advised patient to maintain close follow up with Stephens Shire, MD for primary care needs.      I spent 40 minutes in the care of the patient today including review of labs from [facility], Lipids, Thyroid Function, Hematology (current and previous including abstractions from other facilities); face-to-face time discussing  her blood glucose readings/logs, discussing hypoglycemia and hyperglycemia episodes and symptoms, medications doses, her options of short and long term treatment based on the latest standards of care / guidelines;  discussion about incorporating lifestyle medicine;  and documenting the encounter. Risk reduction counseling performed per USPSTF guidelines to reduce obesity and cardiovascular risk factors.     Please refer to Patient Instructions for Blood Glucose Monitoring and Insulin/Medications  Dosing Guide"  in media tab for additional information. Please  also refer to " Patient Self Inventory" in the Media  tab for reviewed elements of pertinent patient history.  [name] participated in the discussions, expressed understanding, and voiced agreement with the above plans.  All questions were answered to her satisfaction. she is encouraged to contact  clinic should she have any questions or concerns prior to her return visit.    Follow up plan: - Return in about 3 months (around 12/11/2022) for Diabetes F/U with A1c in office, No previsit labs, Bring meter and logs.  Rayetta Pigg, [facility] [address] [place], [place] [address] Phone: [phone] Fax: [phone]  09/10/2022, 11:22 AM

## 2022-09-16 ENCOUNTER — Telehealth: Payer: Self-pay | Admitting: Nurse Practitioner

## 2022-09-16 NOTE — Telephone Encounter (Signed)
Last office note sent

## 2022-09-16 NOTE — Telephone Encounter (Signed)
[facility] called and said they received an order for her Dexcom but did not get the last office notes. Can that be re sent ?

## 2022-09-18 NOTE — Telephone Encounter (Signed)
Noted  

## 2022-10-12 ENCOUNTER — Encounter: Payer: Self-pay | Admitting: Internal Medicine

## 2022-11-09 ENCOUNTER — Other Ambulatory Visit: Payer: Self-pay | Admitting: "Endocrinology

## 2022-11-09 ENCOUNTER — Other Ambulatory Visit: Payer: Self-pay | Admitting: Nurse Practitioner

## 2022-11-09 DIAGNOSIS — Z794 Long term (current) use of insulin: Secondary | ICD-10-CM

## 2022-11-25 NOTE — Progress Notes (Unsigned)
GI Office Note    Referring Provider: Nickola Major, MD Primary Care Physician:  Nickola Major, MD  Primary Gastroenterologist: Cristopher Estimable.Rourk, MD   Chief Complaint   No chief complaint on file.   History of Present Illness   [name] is a 67 y.o. female presenting today at the request of Eksir, Earnest Conroy, MD for ***GERD, positive Cologuard, IDA.  Labs June 2022: Hemoglobin 7.8, MCV 63.2, platelets 167, iron 17, ferritin 3, saturation 4%, B12 216  Labs March 2023: Iron 28, ferritin 9, saturation 7%, B12 859  Labs April 2023: Hemoglobin 10.7, MCV 72.7, platelets unable to verify due to clumping.    Per referral paperwork received from PCP patient had positive Cologuard test in November 2019.  Last office visit note 10/09/2022 with PCP.  Etiology of IDA unknown, history of positive Cologuard, unable to complete colonoscopy due to transportation issue.  Noted to have GERD that was controlled on PPI.  Previous symptoms were dizziness and fatigue as well as dyspnea improved after starting oral iron 1 year prior.  Complained of new dizziness, fatigue, dyspnea over the last few days.  Noted to have a low hemoglobin of 7 in late 2022.  History of Rehman exam, taking magnesium oxide 400 mg nightly to help with leg cramps.  On pravastatin for HLD, strong family history of ASCVD.  Cardiac CT in 2019 with calcium score 8.7 and mild proximal LAD disease.  Taking Synthroid daily.  On Lantus 60 units nightly and NovoLog 8 to 14 units 3 times daily before meals, Mounjaro, and metformin.  CBC and iron panel ordered as well as referral to GI.  Patient stated she was ready for colonoscopy.  Advised to continue PPI and consider upper endoscopy as well.  Previously with B12 deficiency, not on supplementation.  Plan to recheck.  Labs 10/09/2022: Iron 20, ferritin 8, saturation 5%, hemoglobin 9.6, MCV 77.9, platelets 151, B12 420  Today:    Current Outpatient Medications  Medication Sig  Dispense Refill   blood glucose meter kit and supplies KIT Dispense based on patient and insurance preference. Use up to four times daily as directed. (FOR ICD-10 E11.65) 1 each 0   Cholecalciferol (VITAMIN D3) 5000 units TABS Take 1 tablet by mouth daily.      Continuous Blood Gluc Sensor (DEXCOM G7 SENSOR) MISC Inject 1 Application into the skin as directed. Change sensor every 10 days as directed. 6 each 3   cyclobenzaprine (FLEXERIL) 10 MG tablet Take 10 mg by mouth 3 (three) times daily.     DROPLET PEN NEEDLES 31G X 5 MM MISC USE AS INSTRUCTED TO INJECT INSULIN DAILY 100 each 3   ferrous sulfate 325 (65 FE) MG EC tablet Take 325 mg by mouth daily.     FLUoxetine (PROZAC) 20 MG capsule Take 20 mg by mouth daily.     insulin aspart (NOVOLOG FLEXPEN) 100 UNIT/ML FlexPen INJECT 8 TO 14 UNITS UNDER THE SKIN THREE TIMES DAILY WITH MEALS 30 mL 0   insulin glargine (LANTUS SOLOSTAR) 100 UNIT/ML Solostar Pen Inject 60 Units into the skin at bedtime. 30 mL 0   levothyroxine (SYNTHROID) 88 MCG tablet TAKE 1 TABLET EVERY DAY BEFORE BREAKFAST 90 tablet 1   losartan (COZAAR) 100 MG tablet Take 100 mg by mouth daily.     magnesium oxide (MAG-OX) 400 MG tablet Take 800 mg by mouth at bedtime.     metFORMIN (GLUCOPHAGE) 1000 MG tablet Take 1 tablet (1,000  mg total) by mouth 2 (two) times daily. 180 tablet 10   pantoprazole (PROTONIX) 40 MG tablet Take 40 mg by mouth daily.       pregabalin (LYRICA) 100 MG capsule Take 1 capsule (100 mg total) by mouth 2 (two) times daily. 180 capsule 1   tirzepatide (MOUNJARO) 10 MG/0.5ML Pen Inject 10 mg into the skin once a week. 6 mL 1   TRUE METRIX BLOOD GLUCOSE TEST test strip TEST BLOOD SUGAR TWICE DAILY 200 strip 3   No current facility-administered medications for this visit.    Past Medical History:  Diagnosis Date   Anxiety    Coronary atherosclerosis    Cardiac CT 09/2018 with calcium score 8.7 and mild proximal LAD disease   Essential hypertension 2009    GERD (gastroesophageal reflux disease)    Iron deficiency anemia    Osteoarthritis    Palpitations    Tachypalpitations on beta blockers since 2010   Type 2 diabetes mellitus ([place]) 2003   Vitamin D deficiency     Past Surgical History:  Procedure Laterality Date   ABDOMINAL HYSTERECTOMY     History of cervical cancer   TONSILLECTOMY      Family History  Problem Relation Age of Onset   Diabetes Father    Hypertension Father    Heart failure Mother    Stroke Sister 55    Allergies as of 11/26/2022 - Review Complete 09/10/2022  Allergen Reaction Noted   Contrast media [iodinated contrast media]  10/04/2018   Sulfa antibiotics Nausea And Vomiting 04/02/2011   Doxepin Rash and Swelling 04/02/2011    Social History   Socioeconomic History   Marital status: Single    Spouse name: Not on file   Number of children: 2   Years of education: Not on file   Highest education level: Not on file  Occupational History   Occupation: DISABLED    Employer: UNEMPLOYED    Comment: [employer]  Tobacco Use   Smoking status: Never   Smokeless tobacco: Never  Vaping Use   Vaping Use: Never used  Substance and Sexual Activity   Alcohol use: No   Drug use: No   Sexual activity: Never  Other Topics Concern   Not on file  Social History Narrative   Has 2 grandchildren. 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  CV: Denies chest pain, heart palpitations, peripheral edema, syncope.  Resp: Denies shortness of breath at rest or with exertion. Denies wheezing or cough.  GI: see HPI GU : Denies urinary  burning, urinary frequency, urinary hesitancy MS: Denies joint pain, muscle weakness, cramps, or limitation of movement.  Derm: Denies rash, itching, dry skin Psych: Denies depression, anxiety, memory loss, and confusion Heme: Denies bruising, bleeding, and enlarged lymph nodes.   Physical Exam   There were no vitals taken for this visit.  General:   Alert and oriented. Pleasant and cooperative. Well-nourished and well-developed.  Head:  Normocephalic and atraumatic. Eyes:  Without icterus, sclera clear and conjunctiva pink.  Ears:  Normal auditory acuity. Mouth:  No deformity  or lesions, oral mucosa pink.  Lungs:  Clear to auscultation bilaterally. No wheezes, rales, or rhonchi. No distress.  Heart:  S1, S2 present without murmurs appreciated.  Abdomen:  +BS, soft, non-tender and non-distended. No HSM noted. No guarding or rebound. No masses appreciated.  Rectal:  Deferred  Msk:  Symmetrical without gross deformities. Normal posture. Extremities:  Without edema. Neurologic:  Alert and  oriented x4;  grossly normal neurologically. Skin:  Intact without significant lesions or rashes. Psych:  Alert and cooperative. Normal mood and affect.   Assessment   [name] is a 67 y.o. female with a history of IDDM with diabetic peripheral neuropathy, RAI induced hypothyroidism, HTN, HLD, aortic valve disease, CAD, GERD, and IDA*** presenting today for evaluation/management of IDA, GERD, and positive Cologuard  Positive Cologuard: Positive Cologuard in 2019.  Did not complete colonoscopy due to transportation issues. ***  GERD: Currently on pantoprazole 40 mg daily.***  Iron deficiency anemia, B12 deficiency: History of iron deficiency anemia dating back to June 2022 as outlined in HPI.  Also with positive Cologuard as stated above.  She has been symptomatic with dizziness, fatigue, and dyspnea.  Currently taking oral ferrous sulfate 325 mg daily.  Hemoglobin 7.8 in June 2022 and initially  improved to 10.7 in April of this year.  Slight drop in hemoglobin to 9.6 in November.  Iron, saturation, ferritin have remained low.  Needs further evaluation with EGD and TCS to evaluate for potential GI source of anemia.  She will continue PPI daily as well as oral ferrous sulfate 325 mg daily.  If any worsening anemia, may need referral to hematology for IV iron infusions.***   PLAN   *** Continue ferrous sulfate 325 mg daily, will need to hold for procedures. Continue pantoprazole 40 mg daily Proceed with upper endoscopy and colonoscopy with propofol by Dr. Gala Romney in near future: the risks, benefits, and alternatives have been discussed with the patient in detail. The patient states understanding and desires to proceed.  ASA 3 Monitor for any signs of GI bleeding May need to consider hematology referral for IV iron.*** Follow-up 2 months post procedure.   Venetia Night, MSN, FNP-BC, AGACNP-BC [facility]

## 2022-11-26 ENCOUNTER — Ambulatory Visit (INDEPENDENT_AMBULATORY_CARE_PROVIDER_SITE_OTHER): Payer: Medicare HMO | Admitting: Gastroenterology

## 2022-11-26 ENCOUNTER — Encounter: Payer: Self-pay | Admitting: Gastroenterology

## 2022-11-26 VITALS — BP 129/78 | HR 110 | Temp 97.6°F | Ht 68.0 in | Wt 303.8 lb

## 2022-11-26 DIAGNOSIS — D509 Iron deficiency anemia, unspecified: Secondary | ICD-10-CM

## 2022-11-26 DIAGNOSIS — Z1211 Encounter for screening for malignant neoplasm of colon: Secondary | ICD-10-CM

## 2022-11-26 DIAGNOSIS — K625 Hemorrhage of anus and rectum: Secondary | ICD-10-CM

## 2022-11-26 DIAGNOSIS — E538 Deficiency of other specified B group vitamins: Secondary | ICD-10-CM

## 2022-11-26 DIAGNOSIS — R195 Other fecal abnormalities: Secondary | ICD-10-CM

## 2022-11-26 DIAGNOSIS — K219 Gastro-esophageal reflux disease without esophagitis: Secondary | ICD-10-CM

## 2022-11-26 NOTE — Patient Instructions (Addendum)
We are scheduling for an upper endoscopy with possible dilation and colonoscopy in the near future with Dr. Gala Romney. Medication adjustments for your procedure as we discussed: (You will receive separate instructions) Hold metformin night prior to and morning of Half normal dose of Lantus the night prior Continue sliding scale insulin as prescribed Hold Mounjaro for 1 week.  Hold iron for 10 days.    For now continue taking pantoprazole 40 mg daily as well as your oral iron until 10 days prior to procedure as instructed.  As we discussed we may need to consider hematology referral for IV iron if you continue to have deficiency and no explanation for anemia on EGD or colonoscopy.  I would like to recheck your hemoglobin now prior to procedure.   It was a pleasure to see you today. I want to create trusting relationships with patients. If you receive a survey regarding your visit,  I greatly appreciate you taking time to fill this out on paper or through your MyChart. I value your feedback.  Venetia Night, MSN, FNP-BC, AGACNP-BC [facility]

## 2022-11-27 ENCOUNTER — Telehealth: Payer: Self-pay | Admitting: *Deleted

## 2022-11-27 ENCOUNTER — Encounter: Payer: Self-pay | Admitting: *Deleted

## 2022-11-27 LAB — CBC
Hematocrit: 30.5 % — ABNORMAL LOW (ref 34.0–46.6)
Hemoglobin: 8.6 g/dL — ABNORMAL LOW (ref 11.1–15.9)
MCH: 21.3 pg — ABNORMAL LOW (ref 26.6–33.0)
MCHC: 28.2 g/dL — ABNORMAL LOW (ref 31.5–35.7)
MCV: 76 fL — ABNORMAL LOW (ref 79–97)
Platelets: 199 10*3/uL (ref 150–450)
RBC: 4.03 x10E6/uL (ref 3.77–5.28)
RDW: 16.6 % — ABNORMAL HIGH (ref 11.7–15.4)
WBC: 7.2 10*3/uL (ref 3.4–10.8)

## 2022-11-27 MED ORDER — PEG 3350-KCL-NA BICARB-NACL 420 G PO SOLR: 4000.0000 mL | Freq: Once | ORAL | 0 refills | Status: AC

## 2022-11-27 NOTE — Telephone Encounter (Signed)
[employer] PA: Approved Authorization #[id number]  Tracking #[id number]  DOS: 12/16/22-03/19/23

## 2022-12-10 NOTE — Patient Instructions (Signed)
[name]  12/10/2022     '@PREFPERIOPPHARMACY'$ @   Your procedure is scheduled on  12/16/2022.   Report to [facility] at  [email or web address]   Call this number if you have problems the morning of surgery:  [phone]  If you experience any cold or flu symptoms such as cough, fever, chills, shortness of breath, etc. between now and your scheduled surgery, please notify us at the above number.   Remember:  Follow the diet and prep instructions given to you by the office.     Your last dose of mounjaro should be on 12/08/2022.      Take 1/2 (30 units) of your night time insulin the night before your procedure.      DO NOT take any medications for diabetes the morning of your procedure.     Take these medicines the morning of surgery with A SIP OF WATER            flexeril(if needed), fluoxetine, levothyroxine, pantoprazole, pregabalin.     Do not wear jewelry, make-up or nail polish.  Do not wear lotions, powders, or perfumes, or deodorant.  Do not shave 48 hours prior to surgery.  Men may shave face and neck.  Do not bring valuables to the hospital.  [facility] is not responsible for any belongings or valuables.  Contacts, dentures or bridgework may not be worn into surgery.  Leave your suitcase in the car.  After surgery it may be brought to your room.  For patients admitted to the hospital, discharge time will be determined by your treatment team.  Patients discharged the day of surgery will not be allowed to drive home and must have someone with them for 24 hours.    Special instructions:   DO NOT smoke tobacco or vape for 24 hours before your procedure.  Please read over the following fact sheets that you were given. Anesthesia Post-op Instructions and Care and Recovery After Surgery      Upper Endoscopy, Adult, Care After After the procedure, it is common to have a sore throat. It is also common to have: Mild stomach pain or  discomfort. Bloating. Nausea. Follow these instructions at home: The instructions below may help you care for yourself at home. Your health care provider may give you more instructions. If you have questions, ask your health care provider. If you were given a sedative during the procedure, it can affect you for several hours. Do not drive or operate machinery until your health care provider says that it is safe. If you will be going home right after the procedure, plan to have a responsible adult: Take you home from the hospital or clinic. You will not be allowed to drive. Care for you for the time you are told. Follow instructions from your health care provider about what you may eat and drink. Return to your normal activities as told by your health care provider. Ask your health care provider what activities are safe for you. Take over-the-counter and prescription medicines only as told by your health care provider. Contact a health care provider if you: Have a sore throat that lasts longer than one day. Have trouble swallowing. Have a fever. Get help right away if you: Vomit blood or your vomit looks like coffee grounds. Have bloody, black, or tarry stools. Have a very bad sore throat or you cannot swallow. Have difficulty breathing or very bad pain  in your chest or abdomen. These symptoms may be an emergency. Get help right away. Call 911. Do not wait to see if the symptoms will go away. Do not drive yourself to the hospital. Summary After the procedure, it is common to have a sore throat, mild stomach discomfort, bloating, and nausea. If you were given a sedative during the procedure, it can affect you for several hours. Do not drive until your health care provider says that it is safe. Follow instructions from your health care provider about what you may eat and drink. Return to your normal activities as told by your health care provider. This information is not intended to replace  advice given to you by your health care provider. Make sure you discuss any questions you have with your health care provider. Document Revised: 02/18/2022 Document Reviewed: 02/18/2022 Elsevier Patient Education  [place]. Esophageal Dilatation Esophageal dilatation, also called esophageal dilation, is a procedure to widen or open a blocked or narrowed part of the esophagus. The esophagus is the part of the body that moves food and liquid from the mouth to the stomach. You may need this procedure if: You have a buildup of scar tissue in your esophagus that makes it difficult, painful, or impossible to swallow. This can be caused by gastroesophageal reflux disease (GERD). You have cancer of the esophagus. There is a problem with how food moves through your esophagus. In some cases, you may need this procedure repeated at a later time to dilate the esophagus gradually. Tell a health care provider about: Any allergies you have. All medicines you are taking, including vitamins, herbs, eye drops, creams, and over-the-counter medicines. Any problems you or family members have had with anesthetic medicines. Any blood disorders you have. Any surgeries you have had. Any medical conditions you have. Any antibiotic medicines you are required to take before dental procedures. Whether you are pregnant or may be pregnant. What are the risks? Generally, this is a safe procedure. However, problems may occur, including: Bleeding due to a tear in the lining of the esophagus. A hole, or perforation, in the esophagus. What happens before the procedure? Ask your health care provider about: Changing or stopping your regular medicines. This is especially important if you are taking diabetes medicines or blood thinners. Taking medicines such as aspirin and ibuprofen. These medicines can thin your blood. Do not take these medicines unless your health care provider tells you to take them. Taking  over-the-counter medicines, vitamins, herbs, and supplements. Follow instructions from your health care provider about eating or drinking restrictions. Plan to have a responsible adult take you home from the hospital or clinic. Plan to have a responsible adult care for you for the time you are told after you leave the hospital or clinic. This is important. What happens during the procedure? You may be given a medicine to help you relax (sedative). A numbing medicine may be sprayed into the back of your throat, or you may gargle the medicine. Your health care provider may perform the dilatation using various surgical instruments, such as: Simple dilators. This instrument is carefully placed in the esophagus to stretch it. Guided wire bougies. This involves using an endoscope to insert a wire into the esophagus. A dilator is passed over this wire to enlarge the esophagus. Then the wire is removed. Balloon dilators. An endoscope with a small balloon is inserted into the esophagus. The balloon is inflated to stretch the esophagus and open it up. The procedure  may vary among health care providers and hospitals. What can I expect after the procedure? Your blood pressure, heart rate, breathing rate, and blood oxygen level will be monitored until you leave the hospital or clinic. Your throat may feel slightly sore and numb. This will get better over time. You will not be allowed to eat or drink until your throat is no longer numb. When you are able to drink, urinate, and sit on the edge of the bed without nausea or dizziness, you may be able to return home. Follow these instructions at home: Take over-the-counter and prescription medicines only as told by your health care provider. If you were given a sedative during the procedure, it can affect you for several hours. Do not drive or operate machinery until your health care provider says that it is safe. Plan to have a responsible adult care for you for  the time you are told. This is important. Follow instructions from your health care provider about any eating or drinking restrictions. Do not use any products that contain nicotine or tobacco, such as cigarettes, e-cigarettes, and chewing tobacco. If you need help quitting, ask your health care provider. Keep all follow-up visits. This is important. Contact a health care provider if: You have a fever. You have pain that is not relieved by medicine. Get help right away if: You have chest pain. You have trouble breathing. You have trouble swallowing. You vomit blood. You have black, tarry, or bloody stools. These symptoms may represent a serious problem that is an emergency. Do not wait to see if the symptoms will go away. Get medical help right away. Call your local emergency services (911 in the [place] Do not drive yourself to the hospital. Summary Esophageal dilatation, also called esophageal dilation, is a procedure to widen or open a blocked or narrowed part of the esophagus. Plan to have a responsible adult take you home from the hospital or clinic. For this procedure, a numbing medicine may be sprayed into the back of your throat, or you may gargle the medicine. Do not drive or operate machinery until your health care provider says that it is safe. This information is not intended to replace advice given to you by your health care provider. Make sure you discuss any questions you have with your health care provider. Document Revised: 03/27/2020 Document Reviewed: 03/27/2020 Elsevier Patient Education  [place]. Colonoscopy, Adult, Care After The following information offers guidance on how to care for yourself after your procedure. Your health care provider may also give you more specific instructions. If you have problems or questions, contact your health care provider. What can I expect after the procedure? After the procedure, it is common to have: A small amount of blood  in your stool for 24 hours after the procedure. Some gas. Mild cramping or bloating of your abdomen. Follow these instructions at home: Eating and drinking  Drink enough fluid to keep your urine pale yellow. Follow instructions from your health care provider about eating or drinking restrictions. Resume your normal diet as told by your health care provider. Avoid heavy or fried foods that are hard to digest. Activity Rest as told by your health care provider. Avoid sitting for a long time without moving. Get up to take short walks every 1-2 hours. This is important to improve blood flow and breathing. Ask for help if you feel weak or unsteady. Return to your normal activities as told by your health care provider. Ask your  health care provider what activities are safe for you. Managing cramping and bloating  Try walking around when you have cramps or feel bloated. If directed, apply heat to your abdomen as told by your health care provider. Use the heat source that your health care provider recommends, such as a moist heat pack or a heating pad. Place a towel between your skin and the heat source. Leave the heat on for 20-30 minutes. Remove the heat if your skin turns bright red. This is especially important if you are unable to feel pain, heat, or cold. You have a greater risk of getting burned. General instructions If you were given a sedative during the procedure, it can affect you for several hours. Do not drive or operate machinery until your health care provider says that it is safe. For the first 24 hours after the procedure: Do not sign important documents. Do not drink alcohol. Do your regular daily activities at a slower pace than normal. Eat soft foods that are easy to digest. Take over-the-counter and prescription medicines only as told by your health care provider. Keep all follow-up visits. This is important. Contact a health care provider if: You have blood in your stool  2-3 days after the procedure. Get help right away if: You have more than a small spotting of blood in your stool. You have large blood clots in your stool. You have swelling of your abdomen. You have nausea or vomiting. You have a fever. You have increasing pain in your abdomen that is not relieved with medicine. These symptoms may be an emergency. Get help right away. Call 911. Do not wait to see if the symptoms will go away. Do not drive yourself to the hospital. Summary After the procedure, it is common to have a small amount of blood in your stool. You may also have mild cramping and bloating of your abdomen. If you were given a sedative during the procedure, it can affect you for several hours. Do not drive or operate machinery until your health care provider says that it is safe. Get help right away if you have a lot of blood in your stool, nausea or vomiting, a fever, or increased pain in your abdomen. This information is not intended to replace advice given to you by your health care provider. Make sure you discuss any questions you have with your health care provider. Document Revised: 07/02/2021 Document Reviewed: 07/02/2021 Elsevier Patient Education  [place] After The following information offers guidance on how to care for yourself after your procedure. Your health care provider may also give you more specific instructions. If you have problems or questions, contact your health care provider. What can I expect after the procedure? After the procedure, it is common to have: Tiredness. Little or no memory about what happened during or after the procedure. Impaired judgment when it comes to making decisions. Nausea or vomiting. Some trouble with balance. Follow these instructions at home: For the time period you were told by your health care provider:  Rest. Do not participate in activities where you could fall or become  injured. Do not drive or use machinery. Do not drink alcohol. Do not take sleeping pills or medicines that cause drowsiness. Do not make important decisions or sign legal documents. Do not take care of children on your own. Medicines Take over-the-counter and prescription medicines only as told by your health care provider. If you were prescribed antibiotics, take them as  told by your health care provider. Do not stop using the antibiotic even if you start to feel better. Eating and drinking Follow instructions from your health care provider about what you may eat and drink. Drink enough fluid to keep your urine pale yellow. If you vomit: Drink clear fluids slowly and in small amounts as you are able. Clear fluids include water, ice chips, low-calorie sports drinks, and fruit juice that has water added to it (diluted fruit juice). Eat light and bland foods in small amounts as you are able. These foods include bananas, applesauce, rice, lean meats, toast, and crackers. General instructions  Have a responsible adult stay with you for the time you are told. It is important to have someone help care for you until you are awake and alert. If you have sleep apnea, surgery and some medicines can increase your risk for breathing problems. Follow instructions from your health care provider about wearing your sleep device: When you are sleeping. This includes during daytime naps. While taking prescription pain medicines, sleeping medicines, or medicines that make you drowsy. Do not use any products that contain nicotine or tobacco. These products include cigarettes, chewing tobacco, and vaping devices, such as e-cigarettes. If you need help quitting, ask your health care provider. Contact a health care provider if: You feel nauseous or vomit every time you eat or drink. You feel light-headed. You are still sleepy or having trouble with balance after 24 hours. You get a rash. You have a fever. You  have redness or swelling around the IV site. Get help right away if: You have trouble breathing. You have new confusion after you get home. These symptoms may be an emergency. Get help right away. Call 911. Do not wait to see if the symptoms will go away. Do not drive yourself to the hospital. This information is not intended to replace advice given to you by your health care provider. Make sure you discuss any questions you have with your health care provider. Document Revised: 04/06/2022 Document Reviewed: 04/06/2022 Elsevier Patient Education  [place].

## 2022-12-14 ENCOUNTER — Encounter (HOSPITAL_COMMUNITY)
Admission: RE | Admit: 2022-12-14 | Discharge: 2022-12-14 | Disposition: A | Discharge status: Home / Self Care | Payer: Medicare HMO | Source: Ambulatory Visit | Attending: Internal Medicine | Admitting: Internal Medicine

## 2022-12-14 ENCOUNTER — Encounter (HOSPITAL_COMMUNITY): Payer: Self-pay

## 2022-12-14 VITALS — Ht 68.0 in | Wt 303.8 lb

## 2022-12-14 DIAGNOSIS — Z01818 Encounter for other preprocedural examination: Secondary | ICD-10-CM | POA: Diagnosis not present

## 2022-12-14 DIAGNOSIS — D509 Iron deficiency anemia, unspecified: Secondary | ICD-10-CM

## 2022-12-14 DIAGNOSIS — E119 Type 2 diabetes mellitus without complications: Secondary | ICD-10-CM | POA: Diagnosis not present

## 2022-12-14 DIAGNOSIS — I10 Essential (primary) hypertension: Secondary | ICD-10-CM | POA: Diagnosis not present

## 2022-12-14 DIAGNOSIS — Z794 Long term (current) use of insulin: Secondary | ICD-10-CM | POA: Diagnosis not present

## 2022-12-14 DIAGNOSIS — E118 Type 2 diabetes mellitus with unspecified complications: Secondary | ICD-10-CM

## 2022-12-14 DIAGNOSIS — R000 Tachycardia, unspecified: Secondary | ICD-10-CM | POA: Insufficient documentation

## 2022-12-14 HISTORY — DX: Hypothyroidism, unspecified: E03.9

## 2022-12-14 LAB — CBC WITH DIFFERENTIAL/PLATELET
Abs Immature Granulocytes: 0.05 10*3/uL (ref 0.00–0.07)
Basophils Absolute: 0.1 10*3/uL (ref 0.0–0.1)
Basophils Relative: 1 %
Eosinophils Absolute: 0.2 10*3/uL (ref 0.0–0.5)
Eosinophils Relative: 2 %
HCT: 31.4 % — ABNORMAL LOW (ref 36.0–46.0)
Hemoglobin: 8.5 g/dL — ABNORMAL LOW (ref 12.0–15.0)
Immature Granulocytes: 1 %
Lymphocytes Relative: 19 %
Lymphs Abs: 1.5 10*3/uL (ref 0.7–4.0)
MCH: 21.1 pg — ABNORMAL LOW (ref 26.0–34.0)
MCHC: 27.1 g/dL — ABNORMAL LOW (ref 30.0–36.0)
MCV: 77.9 fL — ABNORMAL LOW (ref 80.0–100.0)
Monocytes Absolute: 0.7 10*3/uL (ref 0.1–1.0)
Monocytes Relative: 8 %
Neutro Abs: 5.5 10*3/uL (ref 1.7–7.7)
Neutrophils Relative %: 69 %
Platelets: 209 10*3/uL (ref 150–400)
RBC: 4.03 MIL/uL (ref 3.87–5.11)
RDW: 18 % — ABNORMAL HIGH (ref 11.5–15.5)
WBC: 7.9 10*3/uL (ref 4.0–10.5)
nRBC: 0 % (ref 0.0–0.2)

## 2022-12-14 LAB — BASIC METABOLIC PANEL
Anion gap: 10 (ref 5–15)
BUN: 10 mg/dL (ref 8–23)
CO2: 23 mmol/L (ref 22–32)
Calcium: 8.8 mg/dL — ABNORMAL LOW (ref 8.9–10.3)
Chloride: 101 mmol/L (ref 98–111)
Creatinine, Ser: 0.87 mg/dL (ref 0.44–1.00)
GFR, Estimated: >60 mL/min (ref >60)
Glucose, Bld: 222 mg/dL — ABNORMAL HIGH (ref 70–99)
Potassium: 4.2 mmol/L (ref 3.5–5.1)
Sodium: 134 mmol/L — ABNORMAL LOW (ref 135–145)

## 2022-12-15 ENCOUNTER — Ambulatory Visit: Payer: Medicare HMO | Admitting: Nurse Practitioner

## 2022-12-16 ENCOUNTER — Ambulatory Visit (HOSPITAL_COMMUNITY)
Admission: RE | Admit: 2022-12-16 | Discharge: 2022-12-16 | Disposition: A | Discharge status: Home / Self Care | Payer: Medicare HMO | Attending: Internal Medicine | Admitting: Internal Medicine

## 2022-12-16 ENCOUNTER — Encounter (HOSPITAL_COMMUNITY)
Admission: RE | Disposition: A | Discharge status: Home / Self Care | Payer: Self-pay | Source: Home / Self Care | Attending: Internal Medicine

## 2022-12-16 ENCOUNTER — Ambulatory Visit (HOSPITAL_BASED_OUTPATIENT_CLINIC_OR_DEPARTMENT_OTHER): Payer: Medicare HMO | Admitting: Anesthesiology

## 2022-12-16 ENCOUNTER — Other Ambulatory Visit: Payer: Self-pay

## 2022-12-16 ENCOUNTER — Encounter (HOSPITAL_COMMUNITY): Payer: Self-pay | Admitting: Internal Medicine

## 2022-12-16 ENCOUNTER — Other Ambulatory Visit: Payer: Self-pay | Admitting: *Deleted

## 2022-12-16 ENCOUNTER — Ambulatory Visit (HOSPITAL_COMMUNITY): Payer: Medicare HMO | Admitting: Anesthesiology

## 2022-12-16 ENCOUNTER — Telehealth: Payer: Self-pay | Admitting: *Deleted

## 2022-12-16 DIAGNOSIS — K921 Melena: Secondary | ICD-10-CM | POA: Insufficient documentation

## 2022-12-16 DIAGNOSIS — K2970 Gastritis, unspecified, without bleeding: Secondary | ICD-10-CM

## 2022-12-16 DIAGNOSIS — Z79899 Other long term (current) drug therapy: Secondary | ICD-10-CM | POA: Diagnosis not present

## 2022-12-16 DIAGNOSIS — K625 Hemorrhage of anus and rectum: Secondary | ICD-10-CM

## 2022-12-16 DIAGNOSIS — D122 Benign neoplasm of ascending colon: Secondary | ICD-10-CM | POA: Insufficient documentation

## 2022-12-16 DIAGNOSIS — K219 Gastro-esophageal reflux disease without esophagitis: Secondary | ICD-10-CM | POA: Insufficient documentation

## 2022-12-16 DIAGNOSIS — Z7984 Long term (current) use of oral hypoglycemic drugs: Secondary | ICD-10-CM | POA: Diagnosis not present

## 2022-12-16 DIAGNOSIS — D649 Anemia, unspecified: Secondary | ICD-10-CM | POA: Diagnosis not present

## 2022-12-16 DIAGNOSIS — F32A Depression, unspecified: Secondary | ICD-10-CM | POA: Insufficient documentation

## 2022-12-16 DIAGNOSIS — D638 Anemia in other chronic diseases classified elsewhere: Secondary | ICD-10-CM | POA: Insufficient documentation

## 2022-12-16 DIAGNOSIS — E039 Hypothyroidism, unspecified: Secondary | ICD-10-CM | POA: Insufficient documentation

## 2022-12-16 DIAGNOSIS — R195 Other fecal abnormalities: Secondary | ICD-10-CM

## 2022-12-16 DIAGNOSIS — K2950 Unspecified chronic gastritis without bleeding: Secondary | ICD-10-CM | POA: Diagnosis not present

## 2022-12-16 DIAGNOSIS — K621 Rectal polyp: Secondary | ICD-10-CM | POA: Diagnosis not present

## 2022-12-16 DIAGNOSIS — Q438 Other specified congenital malformations of intestine: Secondary | ICD-10-CM | POA: Diagnosis not present

## 2022-12-16 DIAGNOSIS — D500 Iron deficiency anemia secondary to blood loss (chronic): Secondary | ICD-10-CM | POA: Diagnosis not present

## 2022-12-16 DIAGNOSIS — K5710 Diverticulosis of small intestine without perforation or abscess without bleeding: Secondary | ICD-10-CM | POA: Diagnosis not present

## 2022-12-16 DIAGNOSIS — E1165 Type 2 diabetes mellitus with hyperglycemia: Secondary | ICD-10-CM | POA: Diagnosis not present

## 2022-12-16 DIAGNOSIS — I2510 Atherosclerotic heart disease of native coronary artery without angina pectoris: Secondary | ICD-10-CM | POA: Diagnosis not present

## 2022-12-16 DIAGNOSIS — Z794 Long term (current) use of insulin: Secondary | ICD-10-CM | POA: Insufficient documentation

## 2022-12-16 DIAGNOSIS — I10 Essential (primary) hypertension: Secondary | ICD-10-CM

## 2022-12-16 DIAGNOSIS — R1310 Dysphagia, unspecified: Secondary | ICD-10-CM

## 2022-12-16 DIAGNOSIS — F419 Anxiety disorder, unspecified: Secondary | ICD-10-CM | POA: Diagnosis not present

## 2022-12-16 DIAGNOSIS — D128 Benign neoplasm of rectum: Secondary | ICD-10-CM | POA: Diagnosis not present

## 2022-12-16 DIAGNOSIS — Z6841 Body Mass Index (BMI) 40.0 and over, adult: Secondary | ICD-10-CM | POA: Diagnosis not present

## 2022-12-16 DIAGNOSIS — D509 Iron deficiency anemia, unspecified: Secondary | ICD-10-CM

## 2022-12-16 HISTORY — PX: MALONEY DILATION: SHX5535

## 2022-12-16 HISTORY — PX: ESOPHAGOGASTRODUODENOSCOPY (EGD) WITH PROPOFOL: SHX5813

## 2022-12-16 HISTORY — PX: COLONOSCOPY WITH PROPOFOL: SHX5780

## 2022-12-16 HISTORY — PX: BIOPSY: SHX5522

## 2022-12-16 HISTORY — PX: POLYPECTOMY: SHX5525

## 2022-12-16 LAB — COMPREHENSIVE METABOLIC PANEL
ALT: 16 U/L (ref 0–44)
AST: 41 U/L (ref 15–41)
Albumin: 3.2 g/dL — ABNORMAL LOW (ref 3.5–5.0)
Alkaline Phosphatase: 42 U/L (ref 38–126)
Anion gap: 8 (ref 5–15)
BUN: 7 mg/dL — ABNORMAL LOW (ref 8–23)
CO2: 26 mmol/L (ref 22–32)
Calcium: 8.3 mg/dL — ABNORMAL LOW (ref 8.9–10.3)
Chloride: 101 mmol/L (ref 98–111)
Creatinine, Ser: 0.87 mg/dL (ref 0.44–1.00)
GFR, Estimated: >60 mL/min (ref >60)
Glucose, Bld: 169 mg/dL — ABNORMAL HIGH (ref 70–99)
Potassium: 3.7 mmol/L (ref 3.5–5.1)
Sodium: 135 mmol/L (ref 135–145)
Total Bilirubin: 0.7 mg/dL (ref 0.3–1.2)
Total Protein: 6.4 g/dL — ABNORMAL LOW (ref 6.5–8.1)

## 2022-12-16 LAB — CBC WITH DIFFERENTIAL/PLATELET
Abs Immature Granulocytes: 0.06 10*3/uL (ref 0.00–0.07)
Basophils Absolute: 0.1 10*3/uL (ref 0.0–0.1)
Basophils Relative: 1 %
Eosinophils Absolute: 0.2 10*3/uL (ref 0.0–0.5)
Eosinophils Relative: 3 %
HCT: 31.4 % — ABNORMAL LOW (ref 36.0–46.0)
Hemoglobin: 8.7 g/dL — ABNORMAL LOW (ref 12.0–15.0)
Immature Granulocytes: 1 %
Lymphocytes Relative: 19 %
Lymphs Abs: 1.4 10*3/uL (ref 0.7–4.0)
MCH: 21.4 pg — ABNORMAL LOW (ref 26.0–34.0)
MCHC: 27.7 g/dL — ABNORMAL LOW (ref 30.0–36.0)
MCV: 77.1 fL — ABNORMAL LOW (ref 80.0–100.0)
Monocytes Absolute: 0.7 10*3/uL (ref 0.1–1.0)
Monocytes Relative: 10 %
Neutro Abs: 4.6 10*3/uL (ref 1.7–7.7)
Neutrophils Relative %: 66 %
Platelets: 207 10*3/uL (ref 150–400)
RBC: 4.07 MIL/uL (ref 3.87–5.11)
RDW: 17.9 % — ABNORMAL HIGH (ref 11.5–15.5)
WBC: 7 10*3/uL (ref 4.0–10.5)
nRBC: 0 % (ref 0.0–0.2)

## 2022-12-16 LAB — GLUCOSE, CAPILLARY
Glucose-Capillary: 160 mg/dL — ABNORMAL HIGH (ref 70–99)
Glucose-Capillary: 176 mg/dL — ABNORMAL HIGH (ref 70–99)

## 2022-12-16 SURGERY — COLONOSCOPY WITH PROPOFOL
Anesthesia: General

## 2022-12-16 MED ORDER — PROPOFOL 10 MG/ML IV BOLUS
INTRAVENOUS | Status: DC | PRN
  Administered 2022-12-16: 50 mg via INTRAVENOUS

## 2022-12-16 MED ORDER — STERILE WATER FOR IRRIGATION IR SOLN
Status: DC | PRN
  Administered 2022-12-16: 100 mL

## 2022-12-16 MED ORDER — LACTATED RINGERS IV SOLN: INTRAVENOUS | Status: DC | PRN

## 2022-12-16 MED ORDER — PROPOFOL 500 MG/50ML IV EMUL
INTRAVENOUS | Status: DC | PRN
  Administered 2022-12-16: 175 ug/kg/min via INTRAVENOUS

## 2022-12-16 MED ORDER — PHENYLEPHRINE 80 MCG/ML (10ML) SYRINGE FOR IV PUSH (FOR BLOOD PRESSURE SUPPORT)
PREFILLED_SYRINGE | INTRAVENOUS | Status: DC | PRN
  Administered 2022-12-16 (×7): 80 ug via INTRAVENOUS

## 2022-12-16 MED ORDER — LIDOCAINE 2% (20 MG/ML) 5 ML SYRINGE
INTRAMUSCULAR | Status: DC | PRN
  Administered 2022-12-16: 40 mg via INTRAVENOUS

## 2022-12-16 NOTE — Telephone Encounter (Signed)
Pt informed of CT appt date and time, instructions. Verbalized understanding. Read pt instructions for protocol for CT due to having allergy to contrast. Also advised will send in prednisone for her to take before the procedure.

## 2022-12-16 NOTE — Anesthesia Postprocedure Evaluation (Signed)
Anesthesia Post Note  Patient: [name]  Procedure(s) Performed: COLONOSCOPY WITH PROPOFOL ESOPHAGOGASTRODUODENOSCOPY (EGD) WITH PROPOFOL MALONEY DILATION BIOPSY POLYPECTOMY  Patient location during evaluation: Phase II Anesthesia Type: General Level of consciousness: awake and alert and oriented Pain management: pain level controlled Vital Signs Assessment: post-procedure vital signs reviewed and stable Respiratory status: spontaneous breathing, nonlabored ventilation and respiratory function stable Cardiovascular status: blood pressure returned to baseline and stable Postop Assessment: no apparent nausea or vomiting Anesthetic complications: no  No notable events documented.   Last Vitals:  Vitals:   12/16/22 0850 12/16/22 1040  BP: (!) 95/39 (!) 100/39  Pulse: (!) 101 (!) 107  Resp: 20 (!) 22  Temp: 36.9 C 36.9 C  SpO2: 97% 96%    Last Pain:  Vitals:   12/16/22 1040  PainSc: 0-No pain                 Ayline Dingus C Batoul Limes

## 2022-12-16 NOTE — H&P (Signed)
$'@LOGO'A$ @   Primary Care Physician:  Nickola Major, MD Primary Gastroenterologist:  Dr. Gala Romney  Pre-Procedure History & Physical: HPI:  [name] is a 67 y.o. female here for   Further evaluation of esophageal dysphagia (predominantly pill) history of GERD.  Intermittent rectal bleeding iron deficiency and positive Cologuard (2019) NO PRIOR COLONOSCOPY.  Past Medical History:  Diagnosis Date   Anxiety    Coronary atherosclerosis    Cardiac CT 09/2018 with calcium score 8.7 and mild proximal LAD disease   Essential hypertension 2009   GERD (gastroesophageal reflux disease)    Hypothyroidism    Iron deficiency anemia    Osteoarthritis    Palpitations    Tachypalpitations on beta blockers since 2010   Type 2 diabetes mellitus ([place]) 2003   Vitamin B 12 deficiency    Vitamin D deficiency     Past Surgical History:  Procedure Laterality Date   ABDOMINAL HYSTERECTOMY     History of cervical cancer   BREAST BIOPSY Right    TONSILLECTOMY      Prior to Admission medications   Medication Sig Start Date End Date Taking? Authorizing Provider  blood glucose meter kit and supplies KIT Dispense based on patient and insurance preference. Use up to four times daily as directed. (FOR ICD-10 E11.65) 08/20/17  Yes Nida, Marella Chimes, MD  Cholecalciferol (VITAMIN D3) 5000 units TABS Take 5,000 Units by mouth daily.   Yes [provider]  cyanocobalamin (VITAMIN B12) 1000 MCG/ML injection B12 shots at a frequency of once weekly for 4 weeks then can drop down to once monthly thereafter. 10/22/22  Yes [provider]  cyclobenzaprine (FLEXERIL) 10 MG tablet Take 10 mg by mouth 3 (three) times daily as needed for muscle spasms. 01/08/22  Yes [provider]  DROPLET PEN NEEDLES 31G X 5 MM MISC USE AS INSTRUCTED TO INJECT INSULIN DAILY 05/18/22  Yes Brita Romp, NP  ferrous sulfate 325 (65 FE) MG EC tablet Take 325 mg by mouth daily.   Yes [provider]   FLUoxetine (PROZAC) 20 MG capsule Take 20 mg by mouth daily.   Yes [provider]  insulin aspart (NOVOLOG FLEXPEN) 100 UNIT/ML FlexPen INJECT 8 TO 14 UNITS UNDER THE SKIN THREE TIMES DAILY WITH MEALS 11/10/22  Yes Reardon, Loree Fee J, NP  insulin glargine (LANTUS SOLOSTAR) 100 UNIT/ML Solostar Pen Inject 60 Units into the skin at bedtime. 08/11/22  Yes Brita Romp, NP  levothyroxine (SYNTHROID) 88 MCG tablet TAKE 1 TABLET EVERY DAY BEFORE BREAKFAST 11/10/22  Yes Brita Romp, NP  losartan (COZAAR) 100 MG tablet Take 100 mg by mouth daily. 09/21/14  Yes [provider]  magnesium oxide (MAG-OX) 400 MG tablet Take 400 mg by mouth at bedtime.   Yes [provider]  metFORMIN (GLUCOPHAGE) 1000 MG tablet Take 1 tablet (1,000 mg total) by mouth 2 (two) times daily. 09/01/22  Yes Reardon, Juanetta Beets, NP  pantoprazole (PROTONIX) 40 MG tablet Take 40 mg by mouth daily.     Yes [provider]  pregabalin (LYRICA) 100 MG capsule Take 1 capsule (100 mg total) by mouth 2 (two) times daily. 02/19/22  Yes Brita Romp, NP  tirzepatide [facility]) 10 MG/0.5ML Pen Inject 10 mg into the skin once a week. 08/11/22  Yes Brita Romp, NP  TRUE METRIX BLOOD GLUCOSE TEST test strip TEST BLOOD SUGAR TWICE DAILY 11/10/22  Yes Cassandria Anger, MD    Allergies as of 11/27/2022 -  Review Complete 11/26/2022  Allergen Reaction Noted   Contrast media [iodinated contrast media]  10/04/2018   Sulfa antibiotics Nausea And Vomiting 04/02/2011   Doxepin Rash and Swelling 04/02/2011    Family History  Problem Relation Age of Onset   Heart failure Mother    Diabetes Father    Hypertension Father    Stroke Sister 66   Diabetes Brother    Cancer - Colon Other        age 16   Colon polyps Neg Hx     Social History   Socioeconomic History   Marital status: Single    Spouse name: Not on file   Number of children: 2   Years of education: Not on file    Highest education level: Not on file  Occupational History   Occupation: DISABLED    Employer: UNEMPLOYED    Comment: [employer]  Tobacco Use   Smoking status: Never   Smokeless tobacco: Never  Vaping Use   Vaping Use: Never used  Substance and Sexual Activity   Alcohol use: No   Drug use: No   Sexual activity: Never  Other Topics Concern   Not on file  Social History Narrative   Has 2 grandchildren. 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   Social Determinants of Radio broadcast assistant Strain: Not on file  Food Insecurity: Not on file  Transportation Needs: Not on file  Physical Activity: Not on file  Stress: Not on file  Social Connections: Not on file  Intimate Partner Violence: Not on file    Review of Systems: See HPI, otherwise negative ROS  Physical Exam: BP (!) 95/39 (BP Location: Right Arm)   Pulse (!) 101   Temp 98.5 F (36.9 C)   Resp 20   Ht '5\' 8"'$  (1.727 m)   Wt (!) 137 kg   SpO2 97%   BMI 45.92 kg/m  General:   Alert,  Well-developed, well-nourished, pleasant and cooperative in NAD Mouth:  No deformity or lesions. Neck:  Supple; no masses or thyromegaly. No significant cervical adenopathy. Lungs:  Clear throughout to auscultation.   No wheezes, crackles, or rhonchi. No acute distress. Heart:  Regular rate and rhythm; no murmurs, clicks, rubs,  or gallops. Abdomen: Non-distended, normal bowel sounds.  Soft and nontender without appreciable mass or hepatosplenomegaly.  Pulses:  Normal pulses noted. Extremities:  Without clubbing or edema.  Impression/Plan:   67 year old lady with longstanding pill dysphagia iron deficiency and rectal bleeding.  Positive Cologuard in 2019.  No prior colonoscopy  I have offered the patient a EGD with possible esophageal dilation as feasible/appropriate  per plan and a diagnostic colonoscopy per plan.The risks, benefits, limitations, imponderables and alternatives regarding both EGD and  colonoscopy have been reviewed with the patient. Questions have been answered. All parties agreeable.       Notice: This dictation was prepared with Dragon dictation along with smaller phrase technology. Any transcriptional errors that result from this process are unintentional and may not be corrected upon review.

## 2022-12-16 NOTE — Op Note (Signed)
[facility] Patient Name: [name] Procedure Date: 12/16/2022 9:13 AM MRN: [id number] Date of Birth: [birth year] Attending MD: Norvel Richards , MD, [id number] CSN: [id number] Age: 67 Admit Type: Outpatient Procedure:                Upper GI endoscopy Indications:              Dysphagia Providers:                Norvel Richards, MD, Caprice Kluver, Aram Candela Referring MD:              Medicines:                Propofol per Anesthesia Complications:            No immediate complications. Estimated Blood Loss:     Estimated blood loss was minimal. Procedure:                Pre-Anesthesia Assessment:                           - Prior to the procedure, a History and Physical                            was performed, and patient medications and                            allergies were reviewed. The patient's tolerance of                            previous anesthesia was also reviewed. The risks                            and benefits of the procedure and the sedation                            options and risks were discussed with the patient.                            All questions were answered, and informed consent                            was obtained. Prior Anticoagulants: The patient has                            taken no anticoagulant or antiplatelet agents. ASA                            Grade Assessment: III - A patient with severe                            systemic disease. After reviewing the risks and                            benefits, the patient was deemed in satisfactory  condition to undergo the procedure.                           After obtaining informed consent, the endoscope was                            passed under direct vision. Throughout the                            procedure, the patient's blood pressure, pulse, and                            oxygen saturations were monitored continuously. The                             [id number]-H190 ([id number]) scope was introduced through the                            mouth, and advanced to the second part of duodenum.                            The upper GI endoscopy was accomplished without                            difficulty. The patient tolerated the procedure                            well. Scope In: 9:50:01 AM Scope Out: 9:56:21 AM Total Procedure Duration: 0 hours 6 minutes 20 seconds  Findings:      The examined esophagus was normal and easily traversed. Gastric cavity       empty. Diffusely reticulated appearing gastric mucosa. No ulcer or       infiltrating process. Pylorus patent. Examination the bulb and second       portion revealed a large D2 diverticulum. Otherwise, the duodenal mucosa       appeared normal. The scope was withdrawn. Dilation was performed with a       Maloney dilator with mild resistance at 56 Fr. The dilation site was       examined following endoscope reinsertion and showed no change. Estimated       blood loss was minimal. Finally, the gastric mucosa was biopsied. Impression:               - Normal esophagus. Dilated. Abnormal appearing                            stomach of uncertain significance?"biopsied                           -Duodenal diverticulum. Moderate Sedation:      Moderate (conscious) sedation was personally administered by an       anesthesia professional. The following parameters were monitored: oxygen       saturation, heart rate, blood pressure, respiratory rate, EKG, adequacy       of pulmonary ventilation, and response to care. Recommendation:           - Patient  has a contact number available for                            emergencies. The signs and symptoms of potential                            delayed complications were discussed with the                            patient. Return to normal activities tomorrow.                            Written discharge instructions were provided to the                             patient.                           - Resume previous diet.                           - Continue present medications. Follow-up on                            pathology.                           - Return to my office in 3 months. See colonoscopy                            report. Procedure Code(s):        --- Professional ---                           [phone], Esophagogastroduodenoscopy, flexible,                            transoral; diagnostic, including collection of                            specimen(s) by brushing or washing, when performed                            (separate procedure)                           43450, Dilation of esophagus, by unguided sound or                            bougie, single or multiple passes Diagnosis Code(s):        --- Professional ---                           R13.10, Dysphagia, unspecified CPT copyright 2022 American Medical Association. All rights reserved. The codes documented in this report are preliminary and upon coder review may  be revised to meet current compliance requirements. Cristopher Estimable. Gala Romney, MD  Norvel Richards, MD 12/16/2022 10:32:01 AM This report has been signed electronically. Number of Addenda: 0

## 2022-12-16 NOTE — Telephone Encounter (Signed)
Pt instructed to get Benadryl 50 mg over the counter.

## 2022-12-16 NOTE — Telephone Encounter (Signed)
[employer] PA: CT chest/abd/pelvis Approved  [id number], DOS: 12/16/22-01/15/23

## 2022-12-16 NOTE — Op Note (Signed)
[facility] Patient Name: [name] Procedure Date: 12/16/2022 9:08 AM MRN: [id number] Date of Birth: 09-11-1956 Attending MD: Norvel Richards , MD, [id number] CSN: [id number] Age: 67 Admit Type: Outpatient Procedure:                Colonoscopy Indications:              Hematochezia, Iron deficiency anemia secondary to                            chronic blood loss Providers:                Norvel Richards, MD, Caprice Kluver, Aram Candela Referring MD:              Medicines:                Propofol per Anesthesia Complications:            No immediate complications. Estimated Blood Loss:     Estimated blood loss was minimal. Procedure:                Pre-Anesthesia Assessment:                           - Prior to the procedure, a History and Physical                            was performed, and patient medications and                            allergies were reviewed. The patient's tolerance of                            previous anesthesia was also reviewed. The risks                            and benefits of the procedure and the sedation                            options and risks were discussed with the patient.                            All questions were answered, and informed consent                            was obtained. Prior Anticoagulants: The patient has                            taken no anticoagulant or antiplatelet agents. ASA                            Grade Assessment: III - A patient with severe                            systemic disease. After reviewing the risks and  benefits, the patient was deemed in satisfactory                            condition to undergo the procedure.                           After obtaining informed consent, the colonoscope                            was passed under direct vision. Throughout the                            procedure, the patient's blood pressure, pulse, and                             oxygen saturations were monitored continuously. The                            [id number]) scope was introduced through the                            anus and advanced to the the cecum, identified by                            appendiceal orifice and ileocecal valve. The                            ileocecal valve, appendiceal orifice, and rectum                            were photographed. The entire colon was well                            visualized. Scope In: 10:03:16 AM Scope Out: 10:30:34 AM Scope Withdrawal Time: 0 hours 15 minutes 58 seconds  Total Procedure Duration: 0 hours 27 minutes 18 seconds  Findings:      The perianal exam findings include Tumor protruding through anal       orifice. Digital rectal examination revealed bulky tumor extending well       up into the rectum. 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 Please see photos. Colon was redundant. External abdominal pressure       required to reach the cecum patient had left-sided diverticula scattered       medium size. Patient had (4) 4 to 6 mm semipedunculated polyps in the       ascending segment?"all completely removed via cold snare technique. The       remainder the colonic mucosa appeared normal. See photographs of the       rectal lesion on?"face, externally and retroflexed. Multiple biopsies       taken of the rectal lesion with jumbo biopsy forceps. Impression:               - Tumor protruding through anal orifice. 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 Multiple biopsies                            taken.                           -Left-sided diverticula. Redundant colon.                           -(4) ascending colon polyps removed with cold snare Moderate Sedation:      Moderate (conscious)  sedation was personally administered by an       anesthesia professional. The following parameters were monitored: oxygen       saturation, heart rate, blood pressure, respiratory rate, EKG, adequacy       of pulmonary ventilation, and response to care. Recommendation:           - Patient has a contact number available for                            emergencies. The signs and symptoms of potential                            delayed complications were discussed with the                            patient. Return to normal activities tomorrow.                            Written discharge instructions were provided to the                            patient.                           - Advance diet as tolerated.                           - Continue present medications.                           - Repeat colonoscopy date to be determined after                            pending pathology results are reviewed for                            surveillance. Chem-12, CBC, CEA. Pelvic, abdominal                            and chest CTs in the near future. Further                            recommendations to follow.                           -  Return to GI office (date not yet determined). Procedure Code(s):        --- Professional ---                           [phone], Colonoscopy, flexible; diagnostic, including                            collection of specimen(s) by brushing or washing,                            when performed (separate procedure) Diagnosis Code(s):        --- Professional ---                           K92.1, Melena (includes Hematochezia)                           D50.0, Iron deficiency anemia secondary to blood                            loss (chronic) CPT copyright 2022 American Medical Association. All rights reserved. The codes documented in this report are preliminary and upon coder review may  be revised to meet current compliance requirements. Cristopher Estimable. Anet Logsdon, MD Norvel Richards, MD 12/16/2022 10:42:36 AM This report has been signed electronically. Number of Addenda: 0

## 2022-12-16 NOTE — Telephone Encounter (Signed)
Rourk, Cristopher Estimable, MD  Madelin Rear, LPN; Orland Jarred M, CMA  need the allergy protocol for CT scans:  13 hours prior to the procedure 50 mg of prednisone orally x 1 dose  7 hours prior to the procedure prednisone 50 mg x 1 dose  1 hour prior to the procedure prednisone 50 mg x 1 dose and Benadryl 50 mg x 1 dose.  No other meds needed for prep.  Called in to [place].

## 2022-12-16 NOTE — Progress Notes (Signed)
Spoke with Tammy @ Dr. Roseanne Kaufman office to schedule patient for CT of chest/abdomen/pelvis with and without contrast.  Tammy states she will schedule and notify patient.

## 2022-12-16 NOTE — Transfer of Care (Signed)
Immediate Anesthesia Transfer of Care Note  Patient: [name]  Procedure(s) Performed: COLONOSCOPY WITH PROPOFOL ESOPHAGOGASTRODUODENOSCOPY (EGD) WITH PROPOFOL MALONEY DILATION BIOPSY POLYPECTOMY  Patient Location: PACU  Anesthesia Type:General  Level of Consciousness: awake  Airway & Oxygen Therapy: Patient Spontanous Breathing  Post-op Assessment: Report given to RN and Post -op Vital signs reviewed and stable  Post vital signs: Reviewed and stable  Last Vitals:  Vitals Value Taken Time  BP 100/39 12/16/22 1040  Temp 36.9 C 12/16/22 1040  Pulse 106 12/16/22 1042  Resp 25 12/16/22 1042  SpO2 97 % 12/16/22 1042  Vitals shown include unvalidated device data.  Last Pain:  Vitals:   12/16/22 0943  PainSc: 0-No pain         Complications: No notable events documented.

## 2022-12-16 NOTE — Anesthesia Preprocedure Evaluation (Signed)
Anesthesia Evaluation  Patient identified by MRN, date of birth, ID band Patient awake    Reviewed: Allergy & Precautions, H&P , NPO status , Patient's Chart, lab work & pertinent test results  Airway Mallampati: II  TM Distance: >3 FB Neck ROM: Full    Dental  (+) Edentulous Upper, Edentulous Lower   Pulmonary shortness of breath and with exertion   Pulmonary exam normal breath sounds clear to auscultation       Cardiovascular Exercise Tolerance: Poor hypertension, Pt. on medications + CAD and + DOE  Normal cardiovascular exam+ Valvular Problems/Murmurs AS  Rhythm:Regular Rate:Normal     Neuro/Psych  PSYCHIATRIC DISORDERS Anxiety Depression     Neuromuscular disease    GI/Hepatic Neg liver ROS,GERD  Medicated and Controlled,,  Endo/Other  diabetes, Poorly Controlled, Type 2, Oral Hypoglycemic Agents, Insulin DependentHypothyroidism  Morbid obesity  Renal/GU negative Renal ROS  negative genitourinary   Musculoskeletal  (+) Arthritis , Osteoarthritis,    Abdominal   Peds negative pediatric ROS (+)  Hematology  (+) Blood dyscrasia, anemia   Anesthesia Other Findings   Reproductive/Obstetrics negative OB ROS                             Anesthesia Physical Anesthesia Plan  ASA: 3  Anesthesia Plan: General   Post-op Pain Management: Minimal or no pain anticipated   Induction: Intravenous  PONV Risk Score and Plan: Propofol infusion  Airway Management Planned: Nasal Cannula, Natural Airway and Simple Face Mask  Additional Equipment:   Intra-op Plan:   Post-operative Plan:   Informed Consent: I have reviewed the patients History and Physical, chart, labs and discussed the procedure including the risks, benefits and alternatives for the proposed anesthesia with the patient or authorized representative who has indicated his/her understanding and acceptance.     Dental advisory  given  Plan Discussed with: CRNA and Surgeon  Anesthesia Plan Comments:        Anesthesia Quick Evaluation

## 2022-12-16 NOTE — Telephone Encounter (Signed)
[facility]   Pt is scheduled for CT chest/abd/pelvis for 12/17/22 at 4:30 pm, arrive at 4 pm, nothing to eat or drink 4 hours prior.

## 2022-12-16 NOTE — Discharge Instructions (Addendum)
EGD Discharge instructions Please read the instructions outlined below and refer to this sheet in the next few weeks. These discharge instructions provide you with general information on caring for yourself after you leave the hospital. Your doctor may also give you specific instructions. While your treatment has been planned according to the most current medical practices available, unavoidable complications occasionally occur. If you have any problems or questions after discharge, please call your doctor. ACTIVITY You may resume your regular activity but move at a slower pace for the next 24 hours.  Take frequent rest periods for the next 24 hours.  Walking will help expel (get rid of) the air and reduce the bloated feeling in your abdomen.  No driving for 24 hours (because of the anesthesia (medicine) used during the test).  You may shower.  Do not sign any important legal documents or operate any machinery for 24 hours (because of the anesthesia used during the test).  NUTRITION Drink plenty of fluids.  You may resume your normal diet.  Begin with a light meal and progress to your normal diet.  Avoid alcoholic beverages for 24 hours or as instructed by your caregiver.  MEDICATIONS You may resume your normal medications unless your caregiver tells you otherwise.  WHAT YOU CAN EXPECT TODAY You may experience abdominal discomfort such as a feeling of fullness or "gas" pains.  FOLLOW-UP Your doctor will discuss the results of your test with you.  SEEK IMMEDIATE MEDICAL ATTENTION IF ANY OF THE FOLLOWING OCCUR: Excessive nausea (feeling sick to your stomach) and/or vomiting.  Severe abdominal pain and distention (swelling).  Trouble swallowing.  Temperature over 101 F (37.8 C).  Rectal bleeding or vomiting of blood.    Colonoscopy Discharge Instructions  Read the instructions outlined below and refer to this sheet in the next few weeks. These discharge instructions provide you with  general information on caring for yourself after you leave the hospital. Your doctor may also give you specific instructions. While your treatment has been planned according to the most current medical practices available, unavoidable complications occasionally occur. If you have any problems or questions after discharge, call Dr. Gala Romney at [phone]. ACTIVITY You may resume your regular activity, but move at a slower pace for the next 24 hours.  Take frequent rest periods for the next 24 hours.  Walking will help get rid of the air and reduce the bloated feeling in your belly (abdomen).  No driving for 24 hours (because of the medicine (anesthesia) used during the test).   Do not sign any important legal documents or operate any machinery for 24 hours (because of the anesthesia used during the test).  NUTRITION Drink plenty of fluids.  You may resume your normal diet as instructed by your doctor.  Begin with a light meal and progress to your normal diet. Heavy or fried foods are harder to digest and may make you feel sick to your stomach (nauseated).  Avoid alcoholic beverages for 24 hours or as instructed.  MEDICATIONS You may resume your normal medications unless your doctor tells you otherwise.  WHAT YOU CAN EXPECT TODAY Some feelings of bloating in the abdomen.  Passage of more gas than usual.  Spotting of blood in your stool or on the toilet paper.  IF YOU HAD POLYPS REMOVED DURING THE COLONOSCOPY: No aspirin products for 7 days or as instructed.  No alcohol for 7 days or as instructed.  Eat a soft diet for the next 24 hours.  FINDING  OUT THE RESULTS OF YOUR TEST Not all test results are available during your visit. If your test results are not back during the visit, make an appointment with your caregiver to find out the results. Do not assume everything is normal if you have not heard from your caregiver or the medical facility. It is important for you to follow up on all of your test  results.  SEEK IMMEDIATE MEDICAL ATTENTION IF: You have more than a spotting of blood in your stool.  Your belly is swollen (abdominal distention).  You are nauseated or vomiting.  You have a temperature over 101.  You have abdominal pain or discomfort that is severe or gets worse throughout the day.      Your esophagus was dilated today.  Your stomach was inflamed.  Biopsies taken.     As discussed with you in postop you have a large tumor in your rectum.  Biopsies were taken.  4 polyps also            removed  from your colon    You will need further extensive blood work and x-rays (CBC, CEA, CHEM 12) chest, abdomen and pelvic CTs.    Further recommendations to follow once I have the above results above available for review.

## 2022-12-17 ENCOUNTER — Ambulatory Visit (HOSPITAL_COMMUNITY)
Admission: RE | Admit: 2022-12-17 | Discharge: 2022-12-17 | Disposition: A | Discharge status: Home / Self Care | Payer: Medicare HMO | Source: Ambulatory Visit | Attending: Internal Medicine | Admitting: Internal Medicine

## 2022-12-17 DIAGNOSIS — K8020 Calculus of gallbladder without cholecystitis without obstruction: Secondary | ICD-10-CM | POA: Diagnosis not present

## 2022-12-17 DIAGNOSIS — K7460 Unspecified cirrhosis of liver: Secondary | ICD-10-CM | POA: Insufficient documentation

## 2022-12-17 DIAGNOSIS — R609 Edema, unspecified: Secondary | ICD-10-CM | POA: Insufficient documentation

## 2022-12-17 DIAGNOSIS — K625 Hemorrhage of anus and rectum: Secondary | ICD-10-CM | POA: Insufficient documentation

## 2022-12-17 DIAGNOSIS — R911 Solitary pulmonary nodule: Secondary | ICD-10-CM | POA: Diagnosis not present

## 2022-12-17 DIAGNOSIS — K766 Portal hypertension: Secondary | ICD-10-CM | POA: Insufficient documentation

## 2022-12-17 DIAGNOSIS — I700 Atherosclerosis of aorta: Secondary | ICD-10-CM | POA: Insufficient documentation

## 2022-12-17 DIAGNOSIS — R195 Other fecal abnormalities: Secondary | ICD-10-CM | POA: Insufficient documentation

## 2022-12-17 MED ORDER — IOHEXOL 300 MG/ML  SOLN
100.0000 mL | Freq: Once | INTRAMUSCULAR | Status: AC | PRN
  Administered 2022-12-17: 100 mL via INTRAVENOUS

## 2022-12-18 LAB — SURGICAL PATHOLOGY

## 2022-12-18 LAB — CEA: CEA: 3.8 ng/mL (ref 0.0–4.7)

## 2022-12-21 ENCOUNTER — Other Ambulatory Visit: Payer: Self-pay

## 2022-12-21 ENCOUNTER — Other Ambulatory Visit (INDEPENDENT_AMBULATORY_CARE_PROVIDER_SITE_OTHER): Payer: Self-pay | Admitting: *Deleted

## 2022-12-21 ENCOUNTER — Other Ambulatory Visit: Payer: Self-pay | Admitting: *Deleted

## 2022-12-21 ENCOUNTER — Encounter (HOSPITAL_COMMUNITY): Payer: Self-pay | Admitting: Internal Medicine

## 2022-12-21 DIAGNOSIS — R9389 Abnormal findings on diagnostic imaging of other specified body structures: Secondary | ICD-10-CM

## 2022-12-21 DIAGNOSIS — K6289 Other specified diseases of anus and rectum: Secondary | ICD-10-CM

## 2022-12-21 NOTE — Progress Notes (Signed)
Order placed for PET scan and Rectal Cancer Staging MRI per Dr. Delton Coombes.

## 2022-12-23 ENCOUNTER — Other Ambulatory Visit: Payer: Self-pay

## 2022-12-23 DIAGNOSIS — K6289 Other specified diseases of anus and rectum: Secondary | ICD-10-CM

## 2022-12-24 ENCOUNTER — Ambulatory Visit (HOSPITAL_COMMUNITY)
Admission: RE | Admit: 2022-12-24 | Discharge: 2022-12-24 | Disposition: A | Discharge status: Home / Self Care | Payer: Medicare HMO | Source: Ambulatory Visit | Attending: Hematology | Admitting: Hematology

## 2022-12-24 ENCOUNTER — Ambulatory Visit: Payer: Medicare HMO | Admitting: Nurse Practitioner

## 2022-12-24 ENCOUNTER — Encounter: Payer: Self-pay | Admitting: Nurse Practitioner

## 2022-12-24 VITALS — BP 111/70 | HR 109 | Ht 68.0 in | Wt 296.0 lb

## 2022-12-24 DIAGNOSIS — K6289 Other specified diseases of anus and rectum: Secondary | ICD-10-CM | POA: Diagnosis present

## 2022-12-24 DIAGNOSIS — I10 Essential (primary) hypertension: Secondary | ICD-10-CM

## 2022-12-24 DIAGNOSIS — Z794 Long term (current) use of insulin: Secondary | ICD-10-CM

## 2022-12-24 DIAGNOSIS — E782 Mixed hyperlipidemia: Secondary | ICD-10-CM | POA: Diagnosis not present

## 2022-12-24 DIAGNOSIS — E890 Postprocedural hypothyroidism: Secondary | ICD-10-CM | POA: Diagnosis not present

## 2022-12-24 DIAGNOSIS — E118 Type 2 diabetes mellitus with unspecified complications: Secondary | ICD-10-CM | POA: Diagnosis not present

## 2022-12-24 DIAGNOSIS — E559 Vitamin D deficiency, unspecified: Secondary | ICD-10-CM

## 2022-12-24 LAB — POCT GLYCOSYLATED HEMOGLOBIN (HGB A1C): Hemoglobin A1C: 6.6 % — AB (ref 4.0–5.6)

## 2022-12-24 MED ORDER — PREGABALIN 100 MG PO CAPS: 100.0000 mg | ORAL_CAPSULE | Freq: Two times a day (BID) | ORAL | 1 refills | Status: DC

## 2022-12-24 NOTE — Progress Notes (Signed)
12/24/2022               Endocrinology follow-up note   Subjective:    Patient ID: [name], female    DOB: 04/22/1956.  She is returning for follow-up for management of type 2 diabetes, RAI induced hypothyroidism, hyperlipidemia.  PMD:   Nickola Major, MD  Past Medical History:  Diagnosis Date   Anxiety    Coronary atherosclerosis    Cardiac CT 09/2018 with calcium score 8.7 and mild proximal LAD disease   Essential hypertension 2009   GERD (gastroesophageal reflux disease)    Hypothyroidism    Iron deficiency anemia    Osteoarthritis    Palpitations    Tachypalpitations on beta blockers since 2010   Type 2 diabetes mellitus ([place]) 2003   Vitamin B 12 deficiency    Vitamin D deficiency    Past Surgical History:  Procedure Laterality Date   ABDOMINAL HYSTERECTOMY     History of cervical cancer   BIOPSY  12/16/2022   Procedure: BIOPSY;  Surgeon: Daneil Dolin, MD;  [facility];  Service: Endoscopy;;  gastric, rectal   BREAST BIOPSY Right    COLONOSCOPY WITH PROPOFOL N/A 12/16/2022   Procedure: COLONOSCOPY WITH PROPOFOL;  Surgeon: Daneil Dolin, MD;  [facility];  Service: Endoscopy;  Laterality: N/A;  10:45 am   ESOPHAGOGASTRODUODENOSCOPY (EGD) WITH PROPOFOL N/A 12/16/2022   Procedure: ESOPHAGOGASTRODUODENOSCOPY (EGD) WITH PROPOFOL;  Surgeon: Daneil Dolin, MD;  [facility];  Service: Endoscopy;  Laterality: N/A;   MALONEY DILATION N/A 12/16/2022   Procedure: Venia Minks DILATION;  Surgeon: Daneil Dolin, MD;  [facility];  Service: Endoscopy;  Laterality: N/A;   POLYPECTOMY  12/16/2022   Procedure: POLYPECTOMY;  Surgeon: Daneil Dolin, MD;  [facility];  Service: Endoscopy;;   TONSILLECTOMY     Social History   Socioeconomic History   Marital status: Single    Spouse name: Not on file   Number of children: 2   Years of education: Not on file   Highest education level: Not on file  Occupational  History   Occupation: DISABLED    Employer: UNEMPLOYED    Comment: OSTEOARTHRITIS  Tobacco Use   Smoking status: Never   Smokeless tobacco: Never  Vaping Use   Vaping Use: Never used  Substance and Sexual Activity   Alcohol use: No   Drug use: No   Sexual activity: Never  Other Topics Concern   Not on file  Social History Narrative   Has 2 grandchildren. 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 Use up to four times daily as directed. (FOR ICD-10 E11.65)   Cholecalciferol (VITAMIN D3) 5000 units TABS Take 5,000 Units by mouth daily.   cyanocobalamin (VITAMIN B12) 1000 MCG/ML injection B12 shots at a frequency of once weekly for 4 weeks then can drop down to once monthly thereafter.   cyclobenzaprine (FLEXERIL) 10 MG tablet Take 10 mg by mouth 3 (three) times daily as needed for muscle spasms.  DROPLET PEN NEEDLES 31G X 5 MM MISC USE AS INSTRUCTED TO INJECT INSULIN DAILY   ferrous sulfate 325 (65 FE) MG EC tablet Take 325 mg by mouth daily.   FLUoxetine (PROZAC) 20 MG capsule Take 20 mg by mouth daily.   insulin aspart (NOVOLOG FLEXPEN) 100 UNIT/ML FlexPen INJECT 8 TO 14 UNITS UNDER THE SKIN THREE TIMES DAILY WITH MEALS   insulin glargine (LANTUS SOLOSTAR) 100 UNIT/ML Solostar Pen Inject 60 Units into the skin at bedtime.   levothyroxine (SYNTHROID) 88 MCG tablet TAKE 1 TABLET EVERY DAY BEFORE BREAKFAST   losartan (COZAAR) 100 MG tablet Take 100 mg by mouth daily.   magnesium oxide (MAG-OX) 400 MG tablet Take 400 mg by mouth at bedtime.   metFORMIN (GLUCOPHAGE) 1000 MG tablet Take  1 tablet (1,000 mg total) by mouth 2 (two) times daily.   pantoprazole (PROTONIX) 40 MG tablet Take 40 mg by mouth daily.     tirzepatide (MOUNJARO) 10 MG/0.5ML Pen Inject 10 mg into the skin once a week.   TRUE METRIX BLOOD GLUCOSE TEST test strip TEST BLOOD SUGAR TWICE DAILY   [DISCONTINUED] pregabalin (LYRICA) 100 MG capsule Take 1 capsule (100 mg total) by mouth 2 (two) times daily.   pregabalin (LYRICA) 100 MG capsule Take 1 capsule (100 mg total) by mouth 2 (two) times daily.   No facility-administered encounter medications on file as of 12/24/2022.   ALLERGIES: Allergies  Allergen Reactions   Contrast Media [Iodinated Contrast Media] Other (See Comments)    Burning   Sulfa Antibiotics Nausea And Vomiting    Other reaction(s): GI Upset (intolerance) unknown   Doxepin Rash and Swelling   VACCINATION STATUS: Immunization History  Administered Date(s) Administered   Tdap 11/24/2007    Diabetes She presents for her follow-up diabetic visit. She has type 2 diabetes mellitus. Onset time: She was diagnosed at approximate age of 22 years. Her disease course has been improving. There are no hypoglycemic associated symptoms. Pertinent negatives for hypoglycemia include no confusion, nervousness/anxiousness, pallor, seizures or tremors. Associated symptoms include fatigue and foot paresthesias. Pertinent negatives for diabetes include no polydipsia, no polyphagia, no polyuria and no weight loss. There are no hypoglycemic complications. Symptoms are stable. Diabetic complications include heart disease and peripheral neuropathy. Risk factors for coronary artery disease include diabetes mellitus, dyslipidemia, hypertension, obesity, family history, sedentary lifestyle, post-menopausal and stress. Current diabetic treatment includes insulin injections and oral agent (monotherapy) (and Mounjaro). She is compliant with treatment all of the time. Her weight is fluctuating minimally. She is following a  generally healthy diet. When asked about meal planning, she reported none. She has not had a previous visit with a dietitian. She participates in exercise intermittently (has been limited lately due to fatigue). Her home blood glucose trend is fluctuating minimally. Her overall blood glucose range is 140-180 mg/dl. (She presents today with her meter and logs showing at goal glycemic profile overall.  Her POCT A1c today is 6.6%, improving from last visit of 8.8%.  She notes she was diagnosed with colon cancer since last visit, is in the beginning stages of figuring out course of treatment.  Analysis of her meter shows 7-day average of 157, 14-day average of 174, 30-day average of 165, 90-day average of 165.  She denies any hypoglycemia.) An ACE inhibitor/angiotensin II receptor blocker is being taken. She does not see a podiatrist.Eye exam is current.  Hyperlipidemia This is a chronic problem. The current episode started more than 1 year ago. The problem  is controlled. Recent lipid tests were reviewed and are normal. Exacerbating diseases include chronic renal disease, diabetes, hypothyroidism and obesity. There are no known factors aggravating her hyperlipidemia. Pertinent negatives include no myalgias. Current antihyperlipidemic treatment includes statins and exercise. The current treatment provides moderate improvement of lipids. There are no compliance problems.  Risk factors for coronary artery disease include dyslipidemia, diabetes mellitus, hypertension, obesity, a sedentary lifestyle, family history and post-menopausal.  Thyroid Problem Presents for follow-up visit. Symptoms include fatigue. Patient reports no anxiety, cold intolerance, constipation, depressed mood, diarrhea, hair loss, heat intolerance, leg swelling, palpitations, tremors, weight gain or weight loss. The symptoms have been stable (She was given I-131 therapy for hyperthyroidism on May 16, 2018.). Her past medical history is significant  for diabetes and hyperlipidemia.    Review of systems  Constitutional: + decreasing body weight,  current Body mass index is 45.01 kg/m. ,+ fatigue, no subjective hyperthermia, no subjective hypothermia Eyes: no blurry vision, no xerophthalmia ENT: no sore throat, no nodules palpated in throat, no dysphagia/odynophagia, no hoarseness Cardiovascular: no chest pain, no shortness of breath, no palpitations, no leg swelling Respiratory: no cough, no shortness of breath Musculoskeletal: no muscle/joint aches Skin: no rashes, no hyperemia Neurological: no tremors, no dizziness, + numbness/tingling to BLE with pins and needles in toes (worse at night)-stable on Lyrica Psychiatric: + depression/anxiety -controlled   Objective:    BP 111/70 (BP Location: Right Arm, Patient Position: Sitting, Cuff Size: Large)   Pulse (!) 109   Ht '5\' 8"'$  (1.727 m)   Wt 296 lb (134.3 kg)   BMI 45.01 kg/m   Wt Readings from Last 3 Encounters:  12/24/22 296 lb (134.3 kg)  12/16/22 (!) 302 lb 0.5 oz (137 kg)  12/14/22 (!) (P) 303 lb 12.8 oz (137.8 kg)    BP Readings from Last 3 Encounters:  12/24/22 111/70  12/16/22 (!) 100/39  11/26/22 129/78      Physical Exam- Limited  Constitutional:  Body mass index is 45.01 kg/m. , not in acute distress, normal state of mind Eyes:  EOMI, no exophthalmos Musculoskeletal: no gross deformities, strength intact in all four extremities, no gross restriction of joint movements Skin:  no rashes, no hyperemia Neurological: no tremor with outstretched hands   Diabetic Foot Exam - Simple   No data filed     Recent Results (from the past 2160 hour(s))  CBC     Status: Abnormal   Collection Time: 11/26/22  1:43 PM  Result Value Ref Range   WBC 7.2 3.4 - 10.8 x10E3/uL   RBC 4.03 3.77 - 5.28 x10E6/uL   Hemoglobin 8.6 (L) 11.1 - 15.9 g/dL   Hematocrit 30.5 (L) 34.0 - 46.6 %   MCV 76 (L) 79 - 97 fL   MCH 21.3 (L) 26.6 - 33.0 pg   MCHC 28.2 (L) 31.5 - 35.7 g/dL    RDW 16.6 (H) 11.7 - 15.4 %   Platelets 199 150 - 450 x10E3/uL  CBC with Differential/Platelet     Status: Abnormal   Collection Time: 12/14/22 11:22 AM  Result Value Ref Range   WBC 7.9 4.0 - 10.5 K/uL   RBC 4.03 3.87 - 5.11 MIL/uL   Hemoglobin 8.5 (L) 12.0 - 15.0 g/dL    Comment: Reticulocyte Hemoglobin testing may be clinically indicated, consider ordering this additional test [id number]    HCT 31.4 (L) 36.0 - 46.0 %   MCV 77.9 (L) 80.0 - 100.0 fL   MCH 21.1 (L) 26.0 - 34.0  pg   MCHC 27.1 (L) 30.0 - 36.0 g/dL   RDW 18.0 (H) 11.5 - 15.5 %   Platelets 209 150 - 400 K/uL   nRBC 0.0 0.0 - 0.2 %   Neutrophils Relative % 69 %   Neutro Abs 5.5 1.7 - 7.7 K/uL   Lymphocytes Relative 19 %   Lymphs Abs 1.5 0.7 - 4.0 K/uL   Monocytes Relative 8 %   Monocytes Absolute 0.7 0.1 - 1.0 K/uL   Eosinophils Relative 2 %   Eosinophils Absolute 0.2 0.0 - 0.5 K/uL   Basophils Relative 1 %   Basophils Absolute 0.1 0.0 - 0.1 K/uL   Immature Granulocytes 1 %   Abs Immature Granulocytes 0.05 0.00 - 0.07 K/uL    Comment: Performed at [facility], [address], [place], [place] [address]  Basic metabolic panel     Status: Abnormal   Collection Time: 12/14/22 11:22 AM  Result Value Ref Range   Sodium 134 (L) 135 - 145 mmol/L   Potassium 4.2 3.5 - 5.1 mmol/L   Chloride 101 98 - 111 mmol/L   CO2 23 22 - 32 mmol/L   Glucose, Bld 222 (H) 70 - 99 mg/dL    Comment: Glucose reference range applies only to samples taken after fasting for at least 8 hours.   BUN 10 8 - 23 mg/dL   Creatinine, Ser 0.87 0.44 - 1.00 mg/dL   Calcium 8.8 (L) 8.9 - 10.3 mg/dL   GFR, Estimated >60 >60 mL/min    Comment: (NOTE) Calculated using the CKD-EPI Creatinine Equation (2021)    Anion gap 10 5 - 15    Comment: Performed at [facility], [address] [place], [place] [address]  Glucose, capillary     Status: Abnormal   Collection Time: 12/16/22  8:52 AM  Result Value Ref Range   Glucose-Capillary 160 (H) 70 - 99  mg/dL    Comment: Glucose reference range applies only to samples taken after fasting for at least 8 hours.  Glucose, capillary     Status: Abnormal   Collection Time: 12/16/22  9:54 AM  Result Value Ref Range   Glucose-Capillary 176 (H) 70 - 99 mg/dL    Comment: Glucose reference range applies only to samples taken after fasting for at least 8 hours.  Surgical pathology     Status: None   Collection Time: 12/16/22  9:55 AM  Result Value Ref Range   SURGICAL PATHOLOGY      SURGICAL PATHOLOGY CASE: [id number] PATIENT: [facility] Surgical Pathology Report     Clinical History: IDA, GERD, rectal bleeding, positive Cologuard, B12 deficiency (crm)     FINAL MICROSCOPIC DIAGNOSIS:  A. STOMACH, BIOPSY: Mild chronic gastritis with lymphoid aggregate Negative for H. pylori, intestinal metaplasia, dysplasia and carcinoma  B. ASCENDING COLON, POLYPECTOMY: Tubular adenoma Negative for high-grade dysplasia and carcinoma  C. 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  Focally this epithelium shows a cribriform architecture and increased cytologic atypia.  Also within the lamina propria there is focal reactive/desmoplastic stroma. Although no definitive invasion is seen, the desmopla stic stroma is worrisome for an invasive process.  As the specimen is described as a tumor, these fragments may not be entirely representative.  Clinical and endoscopic correlation is recommended with additional sampling as  indicated.  Case is reviewed by Dr. Saralyn Pilar who concurs with the diagnosis.   GROSS DESCRIPTION:  A.  Received in formalin is a tan, soft tissue fragment that is submitted in toto.  Size: 0.2 x 0.2 x 0.1 cm, 1 block  submitted.  B.  Received in formalin are multiple tan-brown soft tissue fragments measuring 1.6 x 1.0 x 0.2 cm in aggregate.  The specimen is entirely submitted in 1 block.  C.  Received in formalin are multiple pink-tan, nodular soft tissue fragments ranging from 0.1 to 0.5 cm.  The specimen is entirely submitted in 1 block. (KW, 12/16/2022)   Final Diagnosis performed by Tobin Chad, MD.   Electronically signed 12/18/2022 Technical component performed at [facility], [place] [address] [place], [place]  [address].  Professional component performed at [employer]. [facility], [place] [address], [place], [place] [address].  Immunohistochemistry Technical component (if applicable) was performed at [facility]. [address], [place], [place], [place] [address].   IMMUNOHISTOCHEMISTRY DISCLAIMER (if applicable): Some of these immunohistochemical stains may have been developed and the performance characteristics determine by [facility]. Some may not have been cleared or approved by the U.S. Food and Drug Administration. The FDA has determined that such clearance or approval is not necessary. This test is used for clinical purposes. It should not be regarded as investigational or for research. This laboratory is certified under the [place] (CLIA-88) as qualified to perform high complexity clinical laboratory testing.  The controls stained appropriately.   CBC with Differential/Platelet     Status: Abnormal   Collection Time: 12/16/22 10:52 AM  Result Value Ref Range   WBC 7.0 4.0 - 10.5 K/uL   RBC 4.07 3.87 - 5.11 MIL/uL   Hemoglobin 8.7 (L) 12.0 - 15.0 g/dL    Comment: Reticulocyte Hemoglobin testing may be clinically indicated, consider ordering this additional test [id number]    HCT 31.4 (L) 36.0 - 46.0 %   MCV 77.1 (L) 80.0 - 100.0 fL   MCH 21.4 (L) 26.0 - 34.0 pg   MCHC 27.7  (L) 30.0 - 36.0 g/dL   RDW 17.9 (H) 11.5 - 15.5 %   Platelets 207 150 - 400 K/uL   nRBC 0.0 0.0 - 0.2 %   Neutrophils Relative % 66 %   Neutro Abs 4.6 1.7 - 7.7 K/uL   Lymphocytes Relative 19 %   Lymphs Abs 1.4 0.7 - 4.0 K/uL   Monocytes Relative 10 %   Monocytes Absolute 0.7 0.1 - 1.0 K/uL   Eosinophils Relative 3 %   Eosinophils Absolute 0.2 0.0 - 0.5 K/uL   Basophils Relative 1 %   Basophils Absolute 0.1 0.0 - 0.1 K/uL   Immature Granulocytes 1 %   Abs Immature Granulocytes 0.06 0.00 - 0.07 K/uL    Comment: Performed at [facility], [address], [place], [place] [address]  CEA     Status: None   Collection Time: 12/16/22 10:52 AM  Result Value Ref Range   CEA 3.8 0.0 - 4.7 ng/mL    Comment: (NOTE)                             Nonsmokers          <3.9  Smokers             <5.6 [employer] Electrochemiluminescence Immunoassay (ECLIA) Values obtained with different assay methods or kits cannot be used interchangeably.  Results cannot be interpreted as absolute evidence of the presence or absence of malignant disease. Performed At: [facility] [place], [place] [id number] Rush Farmer MD [id number]   Comprehensive metabolic panel     Status: Abnormal   Collection Time: 12/16/22 10:52 AM  Result Value Ref Range   Sodium 135 135 - 145 mmol/L   Potassium 3.7 3.5 - 5.1 mmol/L   Chloride 101 98 - 111 mmol/L   CO2 26 22 - 32 mmol/L   Glucose, Bld 169 (H) 70 - 99 mg/dL    Comment: Glucose reference range applies only to samples taken after fasting for at least 8 hours.   BUN 7 (L) 8 - 23 mg/dL   Creatinine, Ser 0.87 0.44 - 1.00 mg/dL   Calcium 8.3 (L) 8.9 - 10.3 mg/dL   Total Protein 6.4 (L) 6.5 - 8.1 g/dL   Albumin 3.2 (L) 3.5 - 5.0 g/dL   AST 41 15 - 41 U/L   ALT 16 0 - 44 U/L   Alkaline Phosphatase 42 38 - 126 U/L   Total Bilirubin 0.7 0.3 - 1.2 mg/dL   GFR, Estimated >60 >60 mL/min    Comment:  (NOTE) Calculated using the CKD-EPI Creatinine Equation (2021)    Anion gap 8 5 - 15    Comment: Performed at [facility], [address], [place], [place] [address]  HgB A1c     Status: Abnormal   Collection Time: 12/24/22  9:45 AM  Result Value Ref Range   Hemoglobin A1C 6.6 (A) 4.0 - 5.6 %   HbA1c POC (<> result, manual entry)     HbA1c, POC (prediabetic range)     HbA1c, POC (controlled diabetic range)      Lipid Panel     Component Value Date/Time   CHOL 135 09/03/2022 1305   TRIG 95 09/03/2022 1305   HDL 41 09/03/2022 1305   CHOLHDL 3.3 09/03/2022 1305   CHOLHDL 3.1 04/17/2020 0803   LDLCALC 76 09/03/2022 1305   [place] 56 04/17/2020 0803     Assessment & Plan:   1) Type 2 diabetes mellitus with complication, with long-term current use of insulin ([place])  - Patient has currently controlled type 2 DM since 67 years of age.  She presents today with her meter and logs showing at goal glycemic profile overall.  Her POCT A1c today is 6.6%, improving from last visit of 8.8%.  She notes she was diagnosed with colon cancer since last visit, is in the beginning stages of figuring out course of treatment.  Analysis of her meter shows 7-day average of 157, 14-day average of 174, 30-day average of 165, 90-day average of 165.  She denies any hypoglycemia.   Recent labs reviewed.   Her diabetes is complicated by obesity/sedentary life, peripheral neuropathy and patient remains at a high risk for more acute and chronic complications which include CAD, CVA, CKD, retinopathy, and neuropathy. These are all discussed in detail with the patient.  - Nutritional counseling repeated at each appointment due to patients tendency to fall back in to old habits.  - The patient admits there is a room for improvement in their diet and drink choices. -  Suggestion is made for the patient to avoid simple carbohydrates from their diet including Cakes, Sweet Desserts /  Pastries, Ice Cream, Soda (diet and  regular), Sweet Tea, Candies, Chips, Cookies, Sweet Pastries, Store Bought Juices, Alcohol in Excess of 1-2 drinks a day, Artificial Sweeteners, Coffee Creamer, and "Sugar-free" Products. This will help patient to have stable blood glucose profile and potentially avoid unintended weight gain.   - I encouraged the patient to switch to unprocessed or minimally processed complex starch and increased protein intake (animal or plant source), fruits, and vegetables.   - Patient is advised to stick to a routine mealtimes to eat 3 meals a day and avoid unnecessary snacks (to snack only to correct hypoglycemia).  - I have approached patient with the following individualized plan to manage diabetes and patient agrees:   -Based on her improved glycemic profile and lack of hypoglycemia, no changes will be made to her medications today.  She is advised to continue Lantus 60 units SQ nightly, Novolog 8-14 units TID with meals if glucose is above 90 and she is eating (Specific instructions on how to titrate insulin dosage based on glucose readings given to patient in writing), Metformin 1000 mg po twice daily with meals, and Mounjaro 10 mg SQ weekly.   -She is encouraged to continue monitoring blood glucose 4 times daily, before meals and at bedtime and call the clinic if readings are less than 70 or greater than 200 for 3 tests in a row.     - Patient specific target  A1c;  LDL, HDL, Triglycerides were discussed in detail.  2) Lipids/HPL: Her most recent lipid panel from 09/03/22 shows controlled LDL of 76.  She is advised to continue Pravastatin 20 mg po daily at bedtime.  Side effects and precautions discussed with her.   3) Hypertension:  Her blood pressure is controlled to target.  She is advised to continue Losartan 100 mg po daily.    4) RAI induced hypothyroidism   -She is status post ablative therapy with I-131 for hyperthyroidism on May 16, 2018.    -There are no recent TFTs to review.  She is  advised to continue Levothyroxine 88 mcg po daily before breakfast.  Will recheck TFTs prior to next visit.   - We discussed about the correct intake of her thyroid hormone, on empty stomach at fasting, with water, separated by at least 30 minutes from breakfast and other medications,  and separated by more than 4 hours from calcium, iron, multivitamins, acid reflux medications (PPIs). -Patient is made aware of the fact that thyroid hormone replacement is needed for life, dose to be adjusted by periodic monitoring of thyroid function tests.  5) Weight management:  Her Body mass index is 45.01 kg/m.-clearly complicating her diabetes care. She is a candidate for modest weight loss.  Carbs education was provided to patient-see above. She has recently lost 19 lbs.  We discussed bariatric surgery at last visit but she never followed up with them.  - I advised patient to maintain close follow up with Nickola Major, MD for primary care needs.      I spent  30  minutes in the care of the patient today including review of labs from [place], Lipids, Thyroid Function, Hematology (current and previous including abstractions from other facilities); face-to-face time discussing  her blood glucose readings/logs, discussing hypoglycemia and hyperglycemia episodes and symptoms, medications doses, her options of short and long term treatment based on the latest standards of care / guidelines;  discussion about incorporating lifestyle medicine;  and documenting the encounter. Risk reduction counseling performed per  USPSTF guidelines to reduce obesity and cardiovascular risk factors.     Please refer to Patient Instructions for Blood Glucose Monitoring and Insulin/Medications Dosing Guide"  in media tab for additional information. Please  also refer to " Patient Self Inventory" in the Media  tab for reviewed elements of pertinent patient history.  [name] participated in the discussions, expressed  understanding, and voiced agreement with the above plans.  All questions were answered to her satisfaction. she is encouraged to contact clinic should she have any questions or concerns prior to her return visit.    Follow up plan: - Return in about 4 months (around 04/24/2023) for Diabetes F/U with A1c in office, Thyroid follow up, Previsit labs.  Rayetta Pigg, [facility] [address] [place], [place] [address] Phone: [phone] Fax: [phone]  12/24/2022, 9:53 AM

## 2022-12-25 ENCOUNTER — Ambulatory Visit (HOSPITAL_BASED_OUTPATIENT_CLINIC_OR_DEPARTMENT_OTHER): Payer: Medicare HMO

## 2022-12-28 ENCOUNTER — Encounter: Payer: Self-pay | Admitting: Gastroenterology

## 2022-12-28 ENCOUNTER — Encounter: Payer: Self-pay | Admitting: Internal Medicine

## 2022-12-31 ENCOUNTER — Encounter (HOSPITAL_COMMUNITY)
Admission: RE | Admit: 2022-12-31 | Discharge: 2022-12-31 | Disposition: A | Discharge status: Home / Self Care | Payer: Medicare HMO | Source: Ambulatory Visit | Attending: Hematology | Admitting: Hematology

## 2022-12-31 DIAGNOSIS — K6289 Other specified diseases of anus and rectum: Secondary | ICD-10-CM | POA: Diagnosis present

## 2022-12-31 DIAGNOSIS — R933 Abnormal findings on diagnostic imaging of other parts of digestive tract: Secondary | ICD-10-CM | POA: Diagnosis not present

## 2022-12-31 DIAGNOSIS — E041 Nontoxic single thyroid nodule: Secondary | ICD-10-CM | POA: Insufficient documentation

## 2022-12-31 MED ORDER — FLUDEOXYGLUCOSE F - 18 (FDG) INJECTION
14.6300 | Freq: Once | INTRAVENOUS | Status: AC | PRN
  Administered 2022-12-31: 14.63 via INTRAVENOUS

## 2023-01-03 DIAGNOSIS — C20 Malignant neoplasm of rectum: Secondary | ICD-10-CM | POA: Insufficient documentation

## 2023-01-04 ENCOUNTER — Inpatient Hospital Stay: Payer: Medicare HMO

## 2023-01-04 ENCOUNTER — Other Ambulatory Visit (HOSPITAL_COMMUNITY)
Admission: RE | Admit: 2023-01-04 | Discharge: 2023-01-04 | Disposition: A | Discharge status: Home / Self Care | Payer: Medicare HMO | Source: Ambulatory Visit | Attending: Hematology | Admitting: Hematology

## 2023-01-04 ENCOUNTER — Encounter: Payer: Self-pay | Admitting: Hematology

## 2023-01-04 ENCOUNTER — Inpatient Hospital Stay: Payer: Medicare HMO | Attending: Hematology | Admitting: Hematology

## 2023-01-04 VITALS — BP 122/65 | HR 104 | Temp 98.6°F | Resp 18 | Ht 67.0 in | Wt 290.8 lb

## 2023-01-04 DIAGNOSIS — D509 Iron deficiency anemia, unspecified: Secondary | ICD-10-CM | POA: Insufficient documentation

## 2023-01-04 DIAGNOSIS — E559 Vitamin D deficiency, unspecified: Secondary | ICD-10-CM | POA: Diagnosis not present

## 2023-01-04 DIAGNOSIS — Z794 Long term (current) use of insulin: Secondary | ICD-10-CM | POA: Insufficient documentation

## 2023-01-04 DIAGNOSIS — I10 Essential (primary) hypertension: Secondary | ICD-10-CM | POA: Insufficient documentation

## 2023-01-04 DIAGNOSIS — C20 Malignant neoplasm of rectum: Secondary | ICD-10-CM | POA: Diagnosis present

## 2023-01-04 DIAGNOSIS — Z79899 Other long term (current) drug therapy: Secondary | ICD-10-CM | POA: Diagnosis not present

## 2023-01-04 DIAGNOSIS — K219 Gastro-esophageal reflux disease without esophagitis: Secondary | ICD-10-CM | POA: Diagnosis not present

## 2023-01-04 DIAGNOSIS — M1990 Unspecified osteoarthritis, unspecified site: Secondary | ICD-10-CM | POA: Diagnosis not present

## 2023-01-04 DIAGNOSIS — E039 Hypothyroidism, unspecified: Secondary | ICD-10-CM | POA: Diagnosis not present

## 2023-01-04 DIAGNOSIS — E1142 Type 2 diabetes mellitus with diabetic polyneuropathy: Secondary | ICD-10-CM | POA: Diagnosis not present

## 2023-01-04 DIAGNOSIS — E538 Deficiency of other specified B group vitamins: Secondary | ICD-10-CM | POA: Diagnosis not present

## 2023-01-04 DIAGNOSIS — Z800 Family history of malignant neoplasm of digestive organs: Secondary | ICD-10-CM | POA: Diagnosis not present

## 2023-01-04 LAB — RETICULOCYTES
Immature Retic Fract: 31.1 % — ABNORMAL HIGH (ref 2.3–15.9)
RBC.: 3.39 MIL/uL — ABNORMAL LOW (ref 3.87–5.11)
Retic Count, Absolute: 99.7 10*3/uL (ref 19.0–186.0)
Retic Ct Pct: 2.9 % (ref 0.4–3.1)

## 2023-01-04 LAB — IRON AND TIBC
Iron: 16 ug/dL — ABNORMAL LOW (ref 28–170)
Saturation Ratios: 4 % — ABNORMAL LOW (ref 10.4–31.8)
TIBC: 370 ug/dL (ref 250–450)
UIBC: 354 ug/dL

## 2023-01-04 LAB — VITAMIN B12: Vitamin B-12: 904 pg/mL (ref 180–914)

## 2023-01-04 LAB — LACTATE DEHYDROGENASE: LDH: 136 U/L (ref 98–192)

## 2023-01-04 LAB — FOLATE: Folate: 3.7 ng/mL — ABNORMAL LOW (ref >5.9)

## 2023-01-04 LAB — FERRITIN: Ferritin: 4 ng/mL — ABNORMAL LOW (ref 11–307)

## 2023-01-04 NOTE — Patient Instructions (Addendum)
[facility]  Discharge Instructions  You were seen and examined today by Dr. Delton Coombes. Dr. Delton Coombes is a medical oncologist, meaning that he specializes in the treatment of cancer diagnoses. Dr. Delton Coombes discussed your past medical history, family history of cancers, and the events that led to you being here today.  You were referred to Dr. Delton Coombes for a new diagnosis of rectal cancer.  Dr. Delton Coombes has reviewed the results of your MRI and PET scan. There is no spread of the cancer to your lymph nodes or other organs within your body.  Dr. Delton Coombes has recommended curative treatment in the form of chemotherapy, followed by oral chemotherapy and radiation therapy, followed by surgery.  Prior to the start of chemotherapy, you will need a Port-A-Cath.  Dr. Delton Coombes will refer you to meet with a Radiation Oncologist and Surgeon.  Follow-up as scheduled.  Thank you for choosing [facility] to provide your oncology and hematology care.   To afford each patient quality time with our provider, please arrive at least 15 minutes before your scheduled appointment time. You may need to reschedule your appointment if you arrive late (10 or more minutes). Arriving late affects you and other patients whose appointments are after yours.  Also, if you miss three or more appointments without notifying the office, you may be dismissed from the clinic at the provider's discretion.    Again, thank you for choosing [facility].  Our hope is that these requests will decrease the amount of time that you wait before being seen by our physicians.   If you have a lab appointment with the [facility] please come in thru the Main Entrance and check in at the main information desk.           _____________________________________________________________  Should you have questions after your visit to [facility], please  contact our office at [phone] and follow the prompts.  Our office hours are 8:00 a.m. to 4:30 p.m. Monday - Thursday and 8:00 a.m. to 2:30 p.m. Friday.  Please note that voicemails left after 4:00 p.m. may not be returned until the following business day.  We are closed weekends and all major holidays.  You do have access to a nurse 24-7, just call the main number to the clinic [phone] and do not press any options, hold on the line and a nurse will answer the phone.    For prescription refill requests, have your pharmacy contact our office and allow 72 hours.    Masks are optional in the cancer centers. If you would like for your care team to wear a mask while they are taking care of you, please let them know. You may have one support person who is at least 66 years old accompany you for your appointments.

## 2023-01-04 NOTE — Progress Notes (Signed)
[facility] [address], [place] [address]   Clinic Day:  01/04/2023  Referring physician: Nickola Major, MD  Patient Care Team: Nickola Major, MD as PCP - General (Family Medicine) Satira Sark, MD as PCP - Cardiology (Cardiology) Gala Romney Cristopher Estimable, MD as Consulting Physician (Gastroenterology) Derek Jack, MD as Medical Oncologist (Medical Oncology) Brien Mates, RN as Oncology Nurse Navigator (Medical Oncology)   ASSESSMENT & PLAN:   Assessment:  1.  Stage IIc (T3d/4b N0 M0) rectal cancer: - Rectal bleeding for the past 4 to 5 years. - Colonoscopy (12/16/2022): Tumor protruding through the anal orifice.  Bulky semilunar lateral rectal tumor extending from the anorectal junction proximally about 13 cm. 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 - CT CAP (12/17/2022): Locally advanced low rectal primary without bowel obstruction or nodal metastasis.  Isolated right middle lobe subpleural lung nodule most likely benign.  Cirrhosis and portal venous hypertension.  2.6 cm right renal lesion with differential hemorrhagic/proteinaceous cyst or solid neoplasm. - PET scan (12/31/2022): Markedly hypermetabolic rectal lesion with no evidence for perirectal or pelvic lymphadenopathy.  No metastatic disease in the neck, chest, abdomen or pelvis.  Focal hypermetabolism in the region of the gallbladder neck corresponds to subtle 13 mm focus of increased attenuation in the lumen of the gallbladder.  Differential includes gallbladder polyp/neoplasm.  2.7 cm exophytic posterior right interpolar renal lesion shows no substantial hypermetabolism, likely cyst.  2.3 cm inferior right thyroid nodule without hypermetabolism. - MRI pelvis (12/24/2022): Extension through muscularis propria.  Tumor size 3.8 x 3 x 5.8 cm with a volume 35 cm.  T stage is T3d, probable T4.  Suspicion of involvement of pelvic floor  musculature and the far posterior and left side of the vagina.  2.  Social/family history: - She lives at home with her family.  She is accompanied by her daughter today.  She is independent of ADLs and IADLs.  She is a retired Freight forwarder at [employer].  Non-smoker.  She reports having hysterectomy in 1986 for cancerous polyps in her endometrium. - Maternal grandmother's sister had colon cancer.  Plan:  1.  Stage IIc (T3d/4BN0M0) rectal cancer: - We discussed images of PET scan and MRI reports. - We discussed the management of locally advanced rectal cancer with TNT approach.  I have recommended chemotherapy followed by chemoradiation followed by surgery. - Will make referral to radiation oncology and surgery. - Will obtain MSI testing. - Recommend port placement for chemotherapy. - We will have to use FOLFOXIRI with dose reduced oxaliplatin due to pre-existing peripheral neuropathy.  2.  Peripheral neuropathy from diabetes: - Predominantly in the toes and feet.  Toes does not have any feeling feet with the variable feeling. - Continue Lyrica 100 mg twice daily.  3.  Severe microcytic anemia: - Ferritin today is 4 and percent saturation of 4.  Folic acid is low at 3.7.  B12 is normal. - Will start her on folic acid 1 mg tablet daily. - She has been taking iron tablet daily for a year. - Recommend parenteral iron therapy with Venofer x 3.   Orders Placed This Encounter  Procedures   IR IMAGING GUIDED PORT INSERTION    Standing Status:   Future    Standing Expiration Date:   01/05/2024    Order Specific Question:   Reason for Exam (SYMPTOM  OR DIAGNOSIS REQUIRED)    Answer:   Chemotherapy  administration    Order Specific Question:   Preferred Imaging Location?    Answer:   [facility]    Order Specific Question:   Release to patient    Answer:   Immediate   Reticulocytes    Standing Status:   Future    Number of Occurrences:   1    Standing Expiration Date:    01/04/2024   Lactate dehydrogenase    Standing Status:   Future    Number of Occurrences:   1    Standing Expiration Date:   01/04/2024   Iron and TIBC ([facility] ONLY)    Standing Status:   Future    Number of Occurrences:   1    Standing Expiration Date:   01/05/2024   Ferritin    Standing Status:   Future    Number of Occurrences:   1    Standing Expiration Date:   01/04/2024   Vitamin B12    Standing Status:   Future    Number of Occurrences:   1    Standing Expiration Date:   01/04/2024   Folate    Standing Status:   Future    Number of Occurrences:   1    Standing Expiration Date:   01/04/2024   Methylmalonic acid, serum    Standing Status:   Future    Number of Occurrences:   1    Standing Expiration Date:   01/04/2024   Copper, serum    Standing Status:   Future    Number of Occurrences:   1    Standing Expiration Date:   01/04/2024   Ambulatory Referral to [facility]    Referral Priority:   Routine    Referral Type:   Consultation    Referral Reason:   Specialty Services Required    Requested Specialty:   Oncology    Number of Visits Requested:   1   Ambulatory referral to Social Work    Referral Priority:   Routine    Referral Type:   Consultation    Referral Reason:   Specialty Services Required    Referred to Provider:   Derek Jack, MD    Number of Visits Requested:   1      Kaleen Odea as a scribe for Derek Jack, MD.,have documented all relevant documentation on the behalf of Derek Jack, MD,as directed by  Derek Jack, MD while in the presence of Derek Jack, MD.   I, Derek Jack MD, have reviewed the above documentation for accuracy and completeness, and I agree with the above.   Derek Jack, MD   2/12/20244:55 PM  CHIEF COMPLAINT/PURPOSE OF CONSULT:   Diagnosis: Rectal Cancer   Cancer Staging  Rectal cancer [facility]) Staging form: Colon and Rectum, AJCC 8th  Edition - Clinical stage from 01/03/2023: Stage IIC (cT4b, cN0, cM0) - Unsigned    Prior Therapy: None  Current Therapy:  Pending   HISTORY OF PRESENT ILLNESS:   Oncology History   No history exists.      [name] is a 67 y.o. female presenting to clinic today for evaluation of rectal cancer at the request of her PCP Dr. Kaleen Mask.  Patient had a colonoscopy on 12/16/22 after having a positive Cologuard. 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 She has since had a NM PET on 12/31/22.  Today, she states that she has been dealing with severe fatigue. Her appetite  level is at 0%. Her energy level is at 0%. She has 3/10 abdominal pain intermittently under her belly button x2-3 months. The stabbing sensation happens at least x2 a day and seems to be random in onset. Her pain is not triggered by eating or with bowel movements.  She reports rectal bleeding started 4yr ago and occurred periodically. 4-5 yrs ago the bleeding increased drastically and could last for days at a time. She had a colonoscopy after the bleeding increased. She has rectal pain only when she passes a bowel movement. In the last 3 wks she has lost 12lbs without trying. She denies any fevers and night sweats.   She has neuropathy in her feet. She has some sensation in her feet but denies any sensation whatsoever in her toes. She also feels a burning sensation in her feet at times. She takes Lyrica 1032mBID with some relief. She denies numbness in her hands.  She cannot walk for a long periods of time due to easy fatigability. This requires her to use a wheelchair when she is out of the house for appointments. She reports that her PCP attributes this to her anemia. She was advised by her PCP to receive IV iron but this has not yet been set up. She denies having a blood transfusion or IV iron in the past. She is allergic to Doxepin (oral swelling) and contrast dye ("burning  in veins").   Her maternal great aunt had colon cancer. She denies any other family Hx of cancer. In 1986 she had a hysterectomy because she had cancerous endometrial polyps. She denies any pelvic problems, vaginal bleeding, or vaginal discharge.   She lives with her family. She is still able to drive. She denies any smoking history.  She is currently retired from being a maCompany secretary PAST MEDICAL HISTORY:   Past Medical History: Past Medical History:  Diagnosis Date   Anxiety    Coronary atherosclerosis    Cardiac CT 09/2018 with calcium score 8.7 and mild proximal LAD disease   Essential hypertension 2009   GERD (gastroesophageal reflux disease)    Hypothyroidism    Iron deficiency anemia    Osteoarthritis    Palpitations    Tachypalpitations on beta blockers since 2010   Type 2 diabetes mellitus ([facility]2003   Vitamin B 12 deficiency    Vitamin D deficiency     Surgical History: Past Surgical History:  Procedure Laterality Date   ABDOMINAL HYSTERECTOMY     History of cervical cancer   BIOPSY  12/16/2022   Procedure: BIOPSY;  Surgeon: RoDaneil DolinMD;  [facility];  Service: Endoscopy;;  gastric, rectal   BREAST BIOPSY Right    COLONOSCOPY WITH PROPOFOL N/A 12/16/2022   Procedure: COLONOSCOPY WITH PROPOFOL;  Surgeon: RoDaneil DolinMD;  [facility];  Service: Endoscopy;  Laterality: N/A;  10:45 am   ESOPHAGOGASTRODUODENOSCOPY (EGD) WITH PROPOFOL N/A 12/16/2022   Procedure: ESOPHAGOGASTRODUODENOSCOPY (EGD) WITH PROPOFOL;  Surgeon: RoDaneil DolinMD;  [facility];  Service: Endoscopy;  Laterality: N/A;   MALONEY DILATION N/A 12/16/2022   Procedure: MAVenia MinksILATION;  Surgeon: RoDaneil DolinMD;  [facility];  Service: Endoscopy;  Laterality: N/A;   POLYPECTOMY  12/16/2022   Procedure: POLYPECTOMY;  Surgeon: RoDaneil DolinMD;  [facility];  Service: Endoscopy;;   TONSILLECTOMY       Social History: Social History   Socioeconomic History  Marital status: Single    Spouse name: Not on file   Number of children: 2   Years of education: Not on file   Highest education level: Not on file  Occupational History   Occupation: DISABLED    Employer: UNEMPLOYED    Comment: [employer]  Tobacco Use   Smoking status: Never   Smokeless tobacco: Never  Vaping Use   Vaping Use: Never used  Substance and Sexual Activity   Alcohol use: No   Drug use: No   Sexual activity: Never  Other Topics Concern   Not on file  Social History Narrative   Has 2 grandchildren. 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   Social Determinants of Health   Financial Resource Strain: Not on file  Food Insecurity: No Food Insecurity (01/04/2023)   Hunger Vital Sign    Worried About Running Out of Food in the Last Year: Never true    Ran Out of Food in the Last Year: Never true  Transportation Needs: No Transportation Needs (01/04/2023)   PRAPARE - Hydrologist (Medical): No    Lack of Transportation (Non-Medical): No  Physical Activity: Not on file  Stress: Not on file  Social Connections: Not on file  Intimate Partner Violence: Not At Risk (01/04/2023)   Humiliation, Afraid, Rape, and Kick questionnaire    Fear of Current or Ex-Partner: No    Emotionally Abused: No    Physically Abused: No    Sexually Abused: No    Family History: Family History  Problem Relation Age of Onset   Heart failure Mother    Diabetes Father    Hypertension Father    Stroke Sister 26   Diabetes Brother    Cancer - Colon Other        age 27   Colon polyps Neg Hx     Current Medications:  Current Outpatient Medications:    blood glucose meter kit and supplies KIT, Dispense based on patient and insurance preference. Use up to four times daily as directed. (FOR ICD-10 E11.65), Disp: 1 each, Rfl: 0   Cholecalciferol (VITAMIN D3) 5000 units TABS,  Take 5,000 Units by mouth daily., Disp: , Rfl:    cyanocobalamin (VITAMIN B12) 1000 MCG/ML injection, B12 shots at a frequency of once weekly for 4 weeks then can drop down to once monthly thereafter., Disp: , Rfl:    cyclobenzaprine (FLEXERIL) 10 MG tablet, Take 10 mg by mouth 3 (three) times daily as needed for muscle spasms., Disp: , Rfl:    DROPLET PEN NEEDLES 31G X 5 MM MISC, USE AS INSTRUCTED TO INJECT INSULIN DAILY, Disp: 100 each, Rfl: 3   ferrous sulfate 325 (65 FE) MG EC tablet, Take 325 mg by mouth daily., Disp: , Rfl:    FLUoxetine (PROZAC) 20 MG capsule, Take 20 mg by mouth daily., Disp: , Rfl:    insulin aspart (NOVOLOG FLEXPEN) 100 UNIT/ML FlexPen, INJECT 8 TO 14 UNITS UNDER THE SKIN THREE TIMES DAILY WITH MEALS, Disp: 30 mL, Rfl: 0   insulin glargine (LANTUS SOLOSTAR) 100 UNIT/ML Solostar Pen, Inject 60 Units into the skin at bedtime., Disp: 30 mL, Rfl: 0   levothyroxine (SYNTHROID) 88 MCG tablet, TAKE 1 TABLET EVERY DAY BEFORE BREAKFAST, Disp: 90 tablet, Rfl: 1   magnesium oxide (MAG-OX) 400 MG tablet, Take 400 mg by mouth at bedtime., Disp: , Rfl:    metFORMIN (GLUCOPHAGE) 1000 MG tablet, Take 1 tablet (1,000 mg total)  by mouth 2 (two) times daily., Disp: 180 tablet, Rfl: 10   pantoprazole (PROTONIX) 40 MG tablet, Take 40 mg by mouth daily.  , Disp: , Rfl:    pregabalin (LYRICA) 100 MG capsule, Take 1 capsule (100 mg total) by mouth 2 (two) times daily., Disp: 180 capsule, Rfl: 1   tirzepatide (MOUNJARO) 10 MG/0.5ML Pen, Inject 10 mg into the skin once a week., Disp: 6 mL, Rfl: 1   TRUE METRIX BLOOD GLUCOSE TEST test strip, TEST BLOOD SUGAR TWICE DAILY, Disp: 200 strip, Rfl: 3   losartan (COZAAR) 100 MG tablet, Take 100 mg by mouth daily. (Patient not taking: Reported on 01/04/2023), Disp: , Rfl:    Allergies: Allergies  Allergen Reactions   Contrast Media [Iodinated Contrast Media] Other (See Comments)    Burning   Sulfa Antibiotics Nausea And Vomiting    Other reaction(s):  GI Upset (intolerance) unknown   Doxepin Rash and Swelling    REVIEW OF SYSTEMS:   Review of Systems  Constitutional:  Negative for chills, fatigue and fever.  HENT:   Negative for lump/mass, mouth sores, nosebleeds, sore throat and trouble swallowing.   Eyes:  Negative for eye problems.  Respiratory:  Negative for cough and shortness of breath.   Cardiovascular:  Negative for chest pain, leg swelling and palpitations.  Gastrointestinal:  Positive for nausea and vomiting [place] last week). Negative for abdominal pain, constipation and diarrhea.  Genitourinary:  Negative for bladder incontinence, difficulty urinating, dysuria, frequency, hematuria and nocturia.   Musculoskeletal:  Negative for arthralgias, back pain, flank pain, myalgias and neck pain.  Skin:  Negative for itching and rash.  Neurological:  Positive for dizziness and numbness (Feet). Negative for headaches.  Hematological:  Does not bruise/bleed easily.  Psychiatric/Behavioral:  Negative for depression, sleep disturbance and suicidal ideas. The patient is not nervous/anxious.   All other systems reviewed and are negative.    VITALS:   Blood pressure 122/65, pulse (!) 104, temperature 98.6 F (37 C), temperature source Oral, resp. rate 18, height 5' 7"$  (1.702 m), weight 290 lb 12.6 oz (131.9 kg), SpO2 98 %.  Wt Readings from Last 3 Encounters:  01/04/23 290 lb 12.6 oz (131.9 kg)  12/24/22 296 lb (134.3 kg)  12/16/22 (!) 302 lb 0.5 oz (137 kg)    Body mass index is 45.54 kg/m.  Performance status (ECOG): 1 - Symptomatic but completely ambulatory  PHYSICAL EXAM:   Physical Exam Vitals and nursing note reviewed. Exam conducted with a chaperone present.  Constitutional:      Appearance: Normal appearance.  Cardiovascular:     Rate and Rhythm: Normal rate and regular rhythm.     Pulses: Normal pulses.     Heart sounds: Normal heart sounds.  Pulmonary:     Effort: Pulmonary effort is normal.     Breath sounds:  Normal breath sounds.  Abdominal:     Palpations: Abdomen is soft. There is no hepatomegaly, splenomegaly or mass.     Tenderness: There is no abdominal tenderness.  Musculoskeletal:     Right lower leg: No edema.     Left lower leg: No edema.  Lymphadenopathy:     Cervical: No cervical adenopathy.     Right cervical: No superficial, deep or posterior cervical adenopathy.    Left cervical: No superficial, deep or posterior cervical adenopathy.     Upper Body:     Right upper body: No supraclavicular or axillary adenopathy.     Left upper body: No supraclavicular  or axillary adenopathy.  Neurological:     General: No focal deficit present.     Mental Status: She is alert and oriented to person, place, and time.  Psychiatric:        Mood and Affect: Mood normal.        Behavior: Behavior normal.     LABS:      Latest Ref Rng & Units 12/16/2022   10:52 AM 12/14/2022   11:22 AM 11/26/2022    1:43 PM  CBC  WBC 4.0 - 10.5 K/uL 7.0  7.9  7.2   Hemoglobin 12.0 - 15.0 g/dL 8.7  8.5  8.6   Hematocrit 36.0 - 46.0 % 31.4  31.4  30.5   Platelets 150 - 400 K/uL 207  209  199       Latest Ref Rng & Units 12/16/2022   10:52 AM 12/14/2022   11:22 AM 09/03/2022    1:05 PM  CMP  Glucose 70 - 99 mg/dL 169  222  183   BUN 8 - 23 mg/dL 7  10  8   $ Creatinine 0.44 - 1.00 mg/dL 0.87  0.87  0.86   Sodium 135 - 145 mmol/L 135  134  139   Potassium 3.5 - 5.1 mmol/L 3.7  4.2  4.7   Chloride 98 - 111 mmol/L 101  101  101   CO2 22 - 32 mmol/L 26  23  25   $ Calcium 8.9 - 10.3 mg/dL 8.3  8.8  9.4   Total Protein 6.5 - 8.1 g/dL 6.4   6.6   Total Bilirubin 0.3 - 1.2 mg/dL 0.7   0.3   Alkaline Phos 38 - 126 U/L 42   62   AST 15 - 41 U/L 41   49   ALT 0 - 44 U/L 16   18      Lab Results  Component Value Date   CEA1 3.8 12/16/2022   /  CEA  Date Value Ref Range Status  12/16/2022 3.8 0.0 - 4.7 ng/mL Final    Comment:    (NOTE)                             Nonsmokers          <3.9                              Smokers             <5.6 Roche [employer] Electrochemiluminescence Immunoassay (ECLIA) Values obtained with different assay methods or kits cannot be used interchangeably.  Results cannot be interpreted as absolute evidence of the presence or absence of malignant disease. Performed At: [facility] [place], [place] [id number] Rush Farmer MD [id number]    No results found for: "PSA1" No results found for: "[id number]" No results found for: "CAN125"  No results found for: "TOTALPROTELP", "ALBUMINELP", "A1GS", "A2GS", "BETS", "BETA2SER", "GAMS", "MSPIKE", "SPEI" Lab Results  Component Value Date   TIBC 370 01/04/2023   FERRITIN 4 (L) 01/04/2023   IRONPCTSAT 4 (L) 01/04/2023   Lab Results  Component Value Date   LDH 136 01/04/2023    Pathology 12/16/22 FINAL MICROSCOPIC DIAGNOSIS:  A. STOMACH, BIOPSY: Mild chronic gastritis with lymphoid aggregate Negative for H. pylori, intestinal metaplasia, dysplasia and carcinoma  B. ASCENDING COLON, POLYPECTOMY: Tubular adenoma Negative for high-grade dysplasia and carcinoma  C. 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  Focally this epithelium shows a cribriform architecture and increased cytologic atypia.  Also within the lamina propria there is focal reactive/desmoplastic stroma. Although no definitive invasion is seen, the desmoplastic stroma is worrisome for an invasive process.  As the specimen is described as a tumor, these fragments may not be entirely representative.  Clinical and endoscopic correlation is recommended with additional sampling as indicated.    STUDIES:

## 2023-01-05 NOTE — Progress Notes (Signed)
Radiation Oncology         [phone] ________________________________  Name: [name]        MRN: [id number]  Date of Service: 01/06/2023 DOB: [birth year]  [id number], Earnest Conroy, MD  Derek Jack, MD     REFERRING PHYSICIAN: Derek Jack, MD   DIAGNOSIS: The encounter diagnosis was Rectal cancer [facility]).   HISTORY OF PRESENT ILLNESS: [name] is a 67 y.o. female seen at the request of Dr. Delton Coombes for a diagnosis of rectal cancer. ***    PREVIOUS RADIATION THERAPY: {EXAM; YES/NO:19492::"No"}   PAST MEDICAL HISTORY:  Past Medical History:  Diagnosis Date   Anxiety    Coronary atherosclerosis    Cardiac CT 09/2018 with calcium score 8.7 and mild proximal LAD disease   Essential hypertension 2009   GERD (gastroesophageal reflux disease)    Hypothyroidism    Iron deficiency anemia    Osteoarthritis    Palpitations    Tachypalpitations on beta blockers since 2010   Type 2 diabetes mellitus ([place]) 2003   Vitamin B 12 deficiency    Vitamin D deficiency        PAST SURGICAL HISTORY: Past Surgical History:  Procedure Laterality Date   ABDOMINAL HYSTERECTOMY     History of cervical cancer   BIOPSY  12/16/2022   Procedure: BIOPSY;  Surgeon: Daneil Dolin, MD;  [facility]: [facility];  Service: Endoscopy;;  gastric, rectal   BREAST BIOPSY Right    COLONOSCOPY WITH PROPOFOL N/A 12/16/2022   Procedure: COLONOSCOPY WITH PROPOFOL;  Surgeon: Daneil Dolin, MD;  Location: [facility];  Service: Endoscopy;  Laterality: N/A;  10:45 am   ESOPHAGOGASTRODUODENOSCOPY (EGD) WITH PROPOFOL N/A 12/16/2022   Procedure: ESOPHAGOGASTRODUODENOSCOPY (EGD) WITH PROPOFOL;  Surgeon: Daneil Dolin, MD;  [facility]: [facility];  Service: Endoscopy;  Laterality: N/A;   MALONEY DILATION N/A 12/16/2022   Procedure: Venia Minks DILATION;  Surgeon: Daneil Dolin, MD;  Location: [facility];  Service: Endoscopy;  Laterality: N/A;   POLYPECTOMY  12/16/2022   Procedure:  POLYPECTOMY;  Surgeon: Daneil Dolin, MD;  Location: [facility];  Service: Endoscopy;;   TONSILLECTOMY       FAMILY HISTORY:  Family History  Problem Relation Age of Onset   Heart failure Mother    Diabetes Father    Hypertension Father    Stroke Sister 74   Diabetes Brother    Cancer - Colon Other        age 51   Colon polyps Neg Hx      SOCIAL HISTORY:  reports that she has never smoked. She has never used smokeless tobacco. She reports that she does not drink alcohol and does not use drugs.   ALLERGIES: Contrast media [iodinated contrast media], Sulfa antibiotics, and Doxepin   MEDICATIONS:  Current Outpatient Medications  Medication Sig Dispense Refill   blood glucose meter kit and supplies KIT Dispense based on patient and insurance preference. Use up to four times daily as directed. (FOR ICD-10 E11.65) 1 each 0   Cholecalciferol (VITAMIN D3) 5000 units TABS Take 5,000 Units by mouth daily.     cyanocobalamin (VITAMIN B12) 1000 MCG/ML injection B12 shots at a frequency of once weekly for 4 weeks then can drop down to once monthly thereafter.     cyclobenzaprine (FLEXERIL) 10 MG tablet Take 10 mg by mouth 3 (three) times daily as needed for muscle spasms.     DROPLET PEN NEEDLES 31G X 5 MM  MISC USE AS INSTRUCTED TO INJECT INSULIN DAILY 100 each 3   ferrous sulfate 325 (65 FE) MG EC tablet Take 325 mg by mouth daily.     FLUoxetine (PROZAC) 20 MG capsule Take 20 mg by mouth daily.     insulin aspart (NOVOLOG FLEXPEN) 100 UNIT/ML FlexPen INJECT 8 TO 14 UNITS UNDER THE SKIN THREE TIMES DAILY WITH MEALS 30 mL 0   insulin glargine (LANTUS SOLOSTAR) 100 UNIT/ML Solostar Pen Inject 60 Units into the skin at bedtime. 30 mL 0   levothyroxine (SYNTHROID) 88 MCG tablet TAKE 1 TABLET EVERY DAY BEFORE BREAKFAST 90 tablet 1   losartan (COZAAR) 100 MG tablet Take 100 mg by mouth daily. (Patient not taking: Reported on 01/04/2023)     magnesium oxide (MAG-OX) 400 MG tablet Take 400 mg  by mouth at bedtime.     metFORMIN (GLUCOPHAGE) 1000 MG tablet Take 1 tablet (1,000 mg total) by mouth 2 (two) times daily. 180 tablet 10   pantoprazole (PROTONIX) 40 MG tablet Take 40 mg by mouth daily.       pregabalin (LYRICA) 100 MG capsule Take 1 capsule (100 mg total) by mouth 2 (two) times daily. 180 capsule 1   tirzepatide (MOUNJARO) 10 MG/0.5ML Pen Inject 10 mg into the skin once a week. 6 mL 1   TRUE METRIX BLOOD GLUCOSE TEST test strip TEST BLOOD SUGAR TWICE DAILY 200 strip 3   No current facility-administered medications for this visit.     REVIEW OF SYSTEMS: On review of systems, the patient reports that *** is doing well overall. *** denies any chest pain, shortness of breath, cough, fevers, chills, night sweats, unintended weight changes. *** denies any bowel or bladder disturbances, and denies abdominal pain, nausea or vomiting. *** denies any new musculoskeletal or joint aches or pains. A complete review of systems is obtained and is otherwise negative.     PHYSICAL EXAM:  Wt Readings from Last 3 Encounters:  01/04/23 290 lb 12.6 oz (131.9 kg)  12/24/22 296 lb (134.3 kg)  12/16/22 (!) 302 lb 0.5 oz (137 kg)   Temp Readings from Last 3 Encounters:  01/04/23 98.6 F (37 C) (Oral)  12/16/22 98.5 F (36.9 C)  12/14/22 (P) 97.6 F (36.4 C) ((P) Oral)   BP Readings from Last 3 Encounters:  01/04/23 122/65  12/24/22 111/70  12/16/22 (!) 100/39   Pulse Readings from Last 3 Encounters:  01/04/23 (!) 104  12/24/22 (!) 109  12/16/22 (!) 107    /10  In general this is a well appearing *** in no acute distress. ***'s alert and oriented x4 and appropriate throughout the examination. Cardiopulmonary assessment is negative for acute distress and *** exhibits normal effort.     ECOG = ***  0 - Asymptomatic (Fully active, able to carry on all predisease activities without restriction)  1 - Symptomatic but completely ambulatory (Restricted in physically strenuous  activity but ambulatory and able to carry out work of a light or sedentary nature. For example, light housework, office work)  2 - Symptomatic, <50% in bed during the day (Ambulatory and capable of all self care but unable to carry out any work activities. Up and about more than 50% of waking hours)  3 - Symptomatic, >50% in bed, but not bedbound (Capable of only limited self-care, confined to bed or chair 50% or more of waking hours)  4 - Bedbound (Completely disabled. Cannot carry on any self-care. Totally confined to bed or chair)  5 -  Death   Eustace Pen MM, Creech RH, Tormey DC, et al. [phone]). "Toxicity and response criteria of the [facility]". [place] Oncol. 5 (6): 649-55    LABORATORY DATA:  Lab Results  Component Value Date   WBC 7.0 12/16/2022   HGB 8.7 (L) 12/16/2022   HCT 31.4 (L) 12/16/2022   MCV 77.1 (L) 12/16/2022   PLT 207 12/16/2022   Lab Results  Component Value Date   NA 135 12/16/2022   K 3.7 12/16/2022   CL 101 12/16/2022   CO2 26 12/16/2022   Lab Results  Component Value Date   ALT 16 12/16/2022   AST 41 12/16/2022   ALKPHOS 42 12/16/2022   BILITOT 0.7 12/16/2022      RADIOGRAPHY: NM PET Image Initial (PI) Skull Base To Thigh  Result Date: 01/01/2023 CLINICAL DATA:  Initial treatment strategy for rectal cancer. EXAM: NUCLEAR MEDICINE PET SKULL BASE TO THIGH TECHNIQUE: 14.6 mCi F-18 FDG was injected intravenously. Full-ring PET imaging was performed from the skull base to thigh after the radiotracer. CT data was obtained and used for attenuation correction and anatomic localization. Fasting blood glucose: 198 mg/dl COMPARISON:  Chest abdomen pelvis CT 12/17/2022 FINDINGS: Mediastinal blood pool activity: SUV max 3.2 Liver activity: SUV max NA NECK: No hypermetabolic lymph nodes in the neck. Incidental CT findings: None. CHEST: No hypermetabolic mediastinal or hilar nodes. No suspicious pulmonary nodules on the CT scan. 2.3 cm inferior  right thyroid nodule noted image 68/3 without hypermetabolism. The 3 mm right middle lobe subpleural nodule identified on the previous CT scan is not evident on CT or PET imaging today. Incidental CT findings: Asymmetric elevation right hemidiaphragm. Atelectasis or scarring noted right base. ABDOMEN/PELVIS: No abnormal hypermetabolic activity within the liver, pancreas, adrenal glands, or spleen. No hypermetabolic lymph nodes in the abdomen or pelvis. Patient's known rectal lesion is markedly hypermetabolic with SUV max = 40. No discernible hypermetabolic perirectal or pelvic lymphadenopathy. The subtle too small to characterize hypoattenuating lesion seen in the hepatic dome on previous CT show no detectable hypermetabolism on PET imaging. There is hypermetabolism in the region of the gallbladder neck with SUV max = 4.9. This corresponds to the subtle 13 mm focus of increased attenuation in the gallbladder neck. While this could be a noncalcified stone, given the FDG accumulation in this region, right upper quadrant ultrasound recommended to further evaluate as gallbladder polyp/neoplasm could have this appearance. Incidental CT findings: As noted previously, liver morphology raises the question of cirrhosis. Diffusely decreased attenuation of liver parenchyma is compatible with fatty deposition. 2.7 cm exophytic posterior right interpolar lesion shows no substantial hypermetabolism with SUV max = 2.8. diverticular changes noted left colon without diverticulitis. SKELETON: No focal hypermetabolic activity to suggest skeletal metastasis. Incidental CT findings: No worrisome lytic or sclerotic osseous abnormality. Skin thickening in superficial subcutaneous edema noted lower anterior abdominal wall, similar to prior. Cellulitis/panniculitis not excluded. IMPRESSION: 1. The patient's known rectal lesion is markedly hypermetabolic. No evidence for hypermetabolic perirectal or pelvic lymphadenopathy. 2. No other  evidence for hypermetabolic metastatic disease in the neck, chest, abdomen, or pelvis. 3. The subtle too small to characterize hypoattenuating lesions seen in the hepatic dome on previous CT show no detectable hypermetabolism on PET imaging. 4. Focal hypermetabolism in the region of the gallbladder neck. This corresponds to a subtle 13 mm focus of increased attenuation in the lumen of the gallbladder. While on CT imaging this could be a noncalcified stone, given the FDG accumulation  in this region, gallbladder polyp/neoplasm is a concern. Right upper quadrant ultrasound recommended to further evaluate. 5. 2.7 cm exophytic posterior right interpolar renal lesion shows no substantial hypermetabolism on PET imaging. Likely a cyst complicated by proteinaceous debris or hemorrhage, close attention on follow-up recommended. 6. 2.3 cm inferior right thyroid nodule without hypermetabolism on PET imaging. Recommend thyroid US (ref: J Am Coll Radiol. 2015 Feb;12(2): 143-50). 7. Similar skin thickening with superficial edema associated with the lower anterior abdominal wall. Electronically Signed   By: Misty Stanley M.D.   On: 01/01/2023 09:51   MR PELVIS WO CM RECTAL CA STAGING  Result Date: 12/24/2022 CLINICAL DATA:  Rectal cancer.  Staging. EXAM: MRI PELVIS WITHOUT CONTRAST TECHNIQUE: Multiplanar multisequence MR imaging of the pelvis was performed. No intravenous contrast was administered. Ultrasound gel was administered per rectum to optimize tumor evaluation. COMPARISON:  CT of 12/17/2022 FINDINGS: TUMOR LOCATION Tumor distance from Anal Verge/Skin surface: 8.9 cm on 19/2 Tumor distance to Internal Anal sphincter: 2.8 cm on 31/8 TUMOR DESCRIPTION Circumferential extent: Centered about the 3 o'clock position, on the order of 240 degrees. Tumor Size and volume: 3.8 x 3.0 x 5.8 cm (volume = 35 cm^3) on images 39/7 and 30/8. T - CATEGORY Extension through Muscularis Propria: At the 2 o'clock position measuring 2.4 x 2.0  cm on 39/7. T3d Shortest Distance of any tumor/node from Mesorectal fascia: Not applicable.-tumor contacts and involves the mesorectal fascia on 39/7 Extramural Vascular Invasion/Tumor Thrombus: No Invasion of Anterior Peritoneal Reflection: No Involvement of Adjacent Organs or Pelvic Sidewall: Indeterminate. Suspicion of involvement of pelvic floor musculature and the far posterior and left side of the vagina on 25/5 and 41/7. Levator Ani Involvement: No N - CATEGORY Mesorectal Lymph Nodes >=71m: None=N0 Extra-mesorectal Lymphadenopathy: No Other: No significant free fluid. Scattered colonic diverticula. Hysterectomy. Normal urinary bladder. ***blank staging*** IMPRESSION: Rectal adenocarcinoma T stage: T3d. Probable T4. Rectal adenocarcinoma N stage:  N0 Distance from tumor to the internal anal sphincter is 2.8 cm. Electronically Signed   By: KAbigail MiyamotoM.D.   On: 12/24/2022 14:27   CT CHEST ABDOMEN PELVIS W CONTRAST  Result Date: 12/18/2022 CLINICAL DATA:  Positive Cologuard test. Tumor protruding through anal orifice. Rectal bleeding. * Tracking Code: BO * EXAM: CT CHEST, ABDOMEN, AND PELVIS WITH CONTRAST TECHNIQUE: Multidetector CT imaging of the chest, abdomen and pelvis was performed following the standard protocol during bolus administration of intravenous contrast. RADIATION DOSE REDUCTION: This exam was performed according to the departmental dose-optimization program which includes automated exposure control, adjustment of the mA and/or kV according to patient size and/or use of iterative reconstruction technique. CONTRAST:  1072mOMNIPAQUE IOHEXOL 300 MG/ML  SOLN COMPARISON:  06/27/2021 chest radiograph.  No prior CTs. FINDINGS: CT CHEST FINDINGS Cardiovascular: Aortic atherosclerosis. Mild cardiomegaly, without pericardial effusion. No central pulmonary embolism, on this non-dedicated study. Mediastinum/Nodes: 1.8 cm right thyroid lobe/isthmic nodule on 05/02. No supraclavicular adenopathy.  No  mediastinal or hilar adenopathy. Lungs/Pleura: No pleural fluid. Moderate right hemidiaphragm elevation. 3 mm subpleural right middle lobe pulmonary nodule on 61/4. Musculoskeletal: No acute osseous abnormality. CT ABDOMEN PELVIS FINDINGS Hepatobiliary: Moderate cirrhosis, as evidenced by marked caudate lobe enlargement and subtle irregular hepatic capsule. Concurrent probable hepatic steatosis. Suspect at least 1 too small to characterize hepatic dome lesion including on images 34 and possibly 33 of series 2. 1.9 cm gallstone without acute cholecystitis or biliary duct dilatation. Pancreas: Normal, without mass or ductal dilatation. Spleen: Splenomegaly, 15.5 cm craniocaudal. Adrenals/Urinary Tract: Normal  adrenal glands. A complex posterior interpolar right renal 2.6 cm lesion on 67/2. Interpolar left renal 1.3 cm cyst. Other bilateral renal lesions which are too small to characterize. No hydronephrosis. Normal urinary bladder. Stomach/Bowel: Proximal gastric underdistention. Eccentric left rectal primary is identified on 114/2 with transmural extension including on 116/2. No bowel obstruction. Extensive colonic diverticulosis. Normal terminal ileum. Normal small bowel. Vascular/Lymphatic: Aortic atherosclerosis. Patent renal veins. Portal venous hypertension, with recannulized paraumbilical vein on [id number]. Patent portal and splenic veins. No abdominopelvic adenopathy. Reproductive: Hysterectomy.  No adnexal mass. Other: No significant free fluid. No evidence of omental or peritoneal disease. Edema within the anterior pelvis on 116/2. Musculoskeletal: No acute osseous abnormality. IMPRESSION: 1. Locally advanced low rectal primary without bowel obstruction or nodal metastasis. 2. Isolated right middle lobe subpleural pulmonary nodule is most likely benign and indicative of a subpleural lymph node. Recommend attention on follow-up. 3. Cirrhosis and portal venous hypertension. Subtle tiny low-density foci within the  liver most likely degenerative nodules. No convincing evidence of hepatic metastasis. 4. Complex 2.6 cm right renal lesion could represent a hemorrhagic/proteinaceous cyst or solid neoplasm. This could either be re-evaluated with dedicated pre and post contrast abdominal imaging on follow-up CTs or more entirely characterized with dedicated pre and post contrast abdominal MRI. 5. Right-sided thyroid/isthmic nodule of 1.8 cm. Recommend thyroid US (ref: J Am Coll Radiol. 2015 Feb;12(2): 143-50). 6.  Aortic Atherosclerosis (ICD10-I70.0). 7. Cholelithiasis 8. Anterior pannus edema could represent panniculitis. Consider physical exam correlation. Electronically Signed   By: Abigail Miyamoto M.D.   On: 12/18/2022 10:06       IMPRESSION/PLAN: 1.   In a visit lasting *** minutes, greater than 50% of the time was spent face to face discussing the patient's condition, in preparation for the discussion, and coordinating the patient's care.   The above documentation reflects my direct findings during this shared patient visit. Please see the separate note by Dr. Lisbeth Renshaw on this date for the remainder of the patient's plan of care.    Carola Rhine, [facility]   **Disclaimer: This note was dictated with voice recognition software. Similar sounding words can inadvertently be transcribed and this note may contain transcription errors which may not have been corrected upon publication of note.**

## 2023-01-05 NOTE — Progress Notes (Signed)
GI Location of Tumor / Histology: Rectal  [name] presented with complaints of rectal bleeding.   MRI Pelvis 12/24/2022: Extension through muscularis propria. Tumor size 3.8 x 3 x 5.8 cm with a volume 35 cm. T stage is T3d, probable T4. Suspicion of involvement of pelvic floor musculature and the far posterior and left side of the vagina.   PET 12/31/2022: Markedly hypermetabolic rectal lesion with no evidence for perirectal or pelvic lymphadenopathy. No metastatic disease in the neck, chest, abdomen or pelvis. Focal hypermetabolism in the region of the gallbladder neck corresponds to subtle 13 mm focus of increased attenuation in the lumen of the gallbladder. Differential includes gallbladder polyp/neoplasm. 2.7 cm exophytic posterior right interpolar renal lesion shows no substantial hypermetabolism, likely cyst. 2.3 cm inferior right thyroid nodule without hypermetabolism.    CT CAP 12/17/2022: Locally advanced low rectal primary without bowel obstruction or nodal metastasis.  Isolated right middle lobe subpleural lung nodule most likely benign.  Cirrhosis and portal venous hypertension.  2.6 cm right renal lesion with differential hemorrhagic/proteinaceous cyst or solid neoplasm.  Colonoscopy 12/16/2022: Tumor protruding through the anal orifice.  Bulky semilunar lateral rectal tumor extending from the anorectal junction proximally about 13 cm.   Biopsies of Rectal Mass 12/16/2022    Past/Anticipated interventions by surgeon, if any:    Past/Anticipated interventions by medical oncology, if any:  Dr. Delton Coombes 01/04/2023 - We discussed the management of locally advanced rectal cancer with TNT approach.  I have recommended chemotherapy followed by chemoradiation followed by surgery. - Will make referral to radiation oncology and surgery. --Chemo Start 01/20/2023   Weight changes, if any: Lost about 12 pounds in the last 3 weeks.  Bowel/Bladder complaints, if any: Reports her bowels  are moving regular, no stool softeners needed. Denies bladder changes.   Nausea / Vomiting, if any: She reports occasional nausea.   Pain issues, if any:  She reports pain in her stomach and headache.   Any blood per rectum: Denies bleeding.    SAFETY ISSUES: Prior radiation? No Pacemaker/ICD? No Possible current pregnancy? Hysterectomy- cancerous polyps. Is the patient on methotrexate? No  Current Complaints/Details: She received Iron infusion this morning.

## 2023-01-06 ENCOUNTER — Inpatient Hospital Stay: Payer: Medicare HMO

## 2023-01-06 ENCOUNTER — Ambulatory Visit: Payer: Medicare HMO

## 2023-01-06 ENCOUNTER — Ambulatory Visit
Admission: RE | Admit: 2023-01-06 | Discharge: 2023-01-06 | Disposition: A | Discharge status: Home / Self Care | Payer: Medicare HMO | Source: Ambulatory Visit | Attending: Radiation Oncology | Admitting: Radiation Oncology

## 2023-01-06 ENCOUNTER — Other Ambulatory Visit: Payer: Self-pay

## 2023-01-06 ENCOUNTER — Institutional Professional Consult (permissible substitution): Payer: Medicare HMO | Admitting: Radiation Oncology

## 2023-01-06 VITALS — BP 122/64 | HR 102 | Temp 96.6°F | Resp 18 | Ht 67.0 in | Wt 293.1 lb

## 2023-01-06 VITALS — BP 120/51 | HR 98 | Temp 97.5°F | Resp 18

## 2023-01-06 DIAGNOSIS — Z800 Family history of malignant neoplasm of digestive organs: Secondary | ICD-10-CM | POA: Insufficient documentation

## 2023-01-06 DIAGNOSIS — I10 Essential (primary) hypertension: Secondary | ICD-10-CM | POA: Insufficient documentation

## 2023-01-06 DIAGNOSIS — C20 Malignant neoplasm of rectum: Secondary | ICD-10-CM

## 2023-01-06 DIAGNOSIS — E039 Hypothyroidism, unspecified: Secondary | ICD-10-CM | POA: Insufficient documentation

## 2023-01-06 DIAGNOSIS — Z794 Long term (current) use of insulin: Secondary | ICD-10-CM | POA: Insufficient documentation

## 2023-01-06 DIAGNOSIS — M1990 Unspecified osteoarthritis, unspecified site: Secondary | ICD-10-CM | POA: Insufficient documentation

## 2023-01-06 DIAGNOSIS — E119 Type 2 diabetes mellitus without complications: Secondary | ICD-10-CM | POA: Insufficient documentation

## 2023-01-06 DIAGNOSIS — K219 Gastro-esophageal reflux disease without esophagitis: Secondary | ICD-10-CM | POA: Insufficient documentation

## 2023-01-06 DIAGNOSIS — D509 Iron deficiency anemia, unspecified: Secondary | ICD-10-CM

## 2023-01-06 DIAGNOSIS — Z79899 Other long term (current) drug therapy: Secondary | ICD-10-CM | POA: Insufficient documentation

## 2023-01-06 DIAGNOSIS — E538 Deficiency of other specified B group vitamins: Secondary | ICD-10-CM | POA: Insufficient documentation

## 2023-01-06 DIAGNOSIS — E559 Vitamin D deficiency, unspecified: Secondary | ICD-10-CM | POA: Insufficient documentation

## 2023-01-06 MED ORDER — ACETAMINOPHEN 325 MG PO TABS
650.0000 mg | ORAL_TABLET | Freq: Once | ORAL | Status: AC
  Administered 2023-01-06: 650 mg via ORAL
  Filled 2023-01-06: qty 2

## 2023-01-06 MED ORDER — SODIUM CHLORIDE 0.9 % IV SOLN: Freq: Once | INTRAVENOUS | Status: AC

## 2023-01-06 MED ORDER — CETIRIZINE HCL 10 MG PO TABS
10.0000 mg | ORAL_TABLET | Freq: Once | ORAL | Status: AC
  Administered 2023-01-06: 10 mg via ORAL
  Filled 2023-01-06: qty 1

## 2023-01-06 MED ORDER — SODIUM CHLORIDE 0.9 % IV SOLN
300.0000 mg | Freq: Once | INTRAVENOUS | Status: AC
  Administered 2023-01-06: 300 mg via INTRAVENOUS
  Filled 2023-01-06: qty 15

## 2023-01-06 NOTE — Patient Instructions (Signed)
[facility]  Discharge Instructions: Thank you for choosing [facility] to provide your oncology and hematology care.  If you have a lab appointment with the [facility], please come in thru the Main Entrance and check in at the main information desk.  Wear comfortable clothing and clothing appropriate for easy access to any Portacath or PICC line.   We strive to give you quality time with your provider. You may need to reschedule your appointment if you arrive late (15 or more minutes).  Arriving late affects you and other patients whose appointments are after yours.  Also, if you miss three or more appointments without notifying the office, you may be dismissed from the clinic at the provider's discretion.      For prescription refill requests, have your pharmacy contact our office and allow 72 hours for refills to be completed.    Iron Sucrose Injection What is this medication? IRON SUCROSE (EYE ern SOO krose) treats low levels of iron (iron deficiency anemia) in people with kidney disease. Iron is a mineral that plays an important role in making red blood cells, which carry oxygen from your lungs to the rest of your body. This medicine may be used for other purposes; ask your health care provider or pharmacist if you have questions. COMMON BRAND NAME(S): Venofer What should I tell my care team before I take this medication? They need to know if you have any of these conditions: Anemia not caused by low iron levels Heart disease High levels of iron in the blood Kidney disease Liver disease An unusual or allergic reaction to iron, other medications, foods, dyes, or preservatives Pregnant or trying to get pregnant Breastfeeding How should I use this medication? This medication is for infusion into a vein. It is given in a hospital or clinic setting. Talk to your care team about the use of this medication in children. While this medication may be  prescribed for children as young as 2 years for selected conditions, precautions do apply. Overdosage: If you think you have taken too much of this medicine contact a poison control center or emergency room at once. NOTE: This medicine is only for you. Do not share this medicine with others. What if I miss a dose? Keep appointments for follow-up doses. It is important not to miss your dose. Call your care team if you are unable to keep an appointment. What may interact with this medication? Do not take this medication with any of the following: Deferoxamine Dimercaprol Other iron products This medication may also interact with the following: Chloramphenicol Deferasirox This list may not describe all possible interactions. Give your health care provider a list of all the medicines, herbs, non-prescription drugs, or dietary supplements you use. Also tell them if you smoke, drink alcohol, or use illegal drugs. Some items may interact with your medicine. What should I watch for while using this medication? Visit your care team regularly. Tell your care team if your symptoms do not start to get better or if they get worse. You may need blood work done while you are taking this medication. You may need to follow a special diet. Talk to your care team. Foods that contain iron include: whole grains/cereals, dried fruits, beans, or peas, leafy green vegetables, and organ meats (liver, kidney). What side effects may I notice from receiving this medication? Side effects that you should report to your care team as soon as possible: Allergic reactions--skin rash, itching, hives,  swelling of the face, lips, tongue, or throat Low blood pressure--dizziness, feeling faint or lightheaded, blurry vision Shortness of breath Side effects that usually do not require medical attention (report to your care team if they continue or are bothersome): Flushing Headache Joint pain Muscle pain Nausea Pain, redness, or  irritation at injection site This list may not describe all possible side effects. Call your doctor for medical advice about side effects. You may report side effects to FDA at 1-800-FDA-1088. Where should I keep my medication? This medication is given in a hospital or clinic and will not be stored at home. NOTE: This sheet is a summary. It may not cover all possible information. If you have questions about this medicine, talk to your doctor, pharmacist, or health care provider.  2023 Elsevier/Gold Standard (2021-02-20 00:00:00)    To help prevent nausea and vomiting after your treatment, we encourage you to take your nausea medication as directed.  BELOW ARE SYMPTOMS THAT SHOULD BE REPORTED IMMEDIATELY: *FEVER GREATER THAN 100.4 F (38 C) OR HIGHER *CHILLS OR SWEATING *NAUSEA AND VOMITING THAT IS NOT CONTROLLED WITH YOUR NAUSEA MEDICATION *UNUSUAL SHORTNESS OF BREATH *UNUSUAL BRUISING OR BLEEDING *URINARY PROBLEMS (pain or burning when urinating, or frequent urination) *BOWEL PROBLEMS (unusual diarrhea, constipation, pain near the anus) TENDERNESS IN MOUTH AND THROAT WITH OR WITHOUT PRESENCE OF ULCERS (sore throat, sores in mouth, or a toothache) UNUSUAL RASH, SWELLING OR PAIN  UNUSUAL VAGINAL DISCHARGE OR ITCHING   Items with * indicate a potential emergency and should be followed up as soon as possible or go to the Emergency Department if any problems should occur.  Please show the CHEMOTHERAPY ALERT CARD or IMMUNOTHERAPY ALERT CARD at check-in to the Emergency Department and triage nurse.  Should you have questions after your visit or need to cancel or reschedule your appointment, please contact [facility] [phone]  and follow the prompts.  Office hours are 8:00 a.m. to 4:30 p.m. Monday - Friday. Please note that voicemails left after 4:00 p.m. may not be returned until the following business day.  We are closed weekends and major holidays. You have access to  a nurse at all times for urgent questions. Please call the main number to the clinic [phone] and follow the prompts.  For any non-urgent questions, you may also contact your provider using MyChart. We now offer e-Visits for anyone 36 and older to request care online for non-urgent symptoms. For details visit [email or web address].   Also download the MyChart app! Go to the app store, search "MyChart", open the app, select [facility], and log in with your MyChart username and password.

## 2023-01-06 NOTE — Progress Notes (Signed)
Patient presents today for iron infusion.  Patient is in satisfactory condition with no complaints voiced.  Vital signs are stable.  We will proceed with infusion per MD orders. ° °Patient tolerated treatment well with no complaints voiced.  Patient left via wheelchair in stable condition.  Vital signs stable at discharge.  Follow up as scheduled.    °

## 2023-01-07 ENCOUNTER — Inpatient Hospital Stay: Payer: Medicare HMO | Admitting: Licensed Clinical Social Worker

## 2023-01-07 ENCOUNTER — Other Ambulatory Visit: Payer: Self-pay | Admitting: Radiation Oncology

## 2023-01-07 DIAGNOSIS — C20 Malignant neoplasm of rectum: Secondary | ICD-10-CM

## 2023-01-07 NOTE — Patient Instructions (Addendum)
[facility] Chemotherapy Teaching   You are diagnosed with Stage IIc rectal cancer . You will be treated in the clinic every two weeks with a combination of chemotherapy drugs. Those drugs are irinotecan, oxaliplatin, and fluorouracil (5FU). There is another drug that is given called leucovorin. This is not chemotherapy but a vitamin that is give to help one of the chemo drugs (5FU) work better. You will come to the clinic every 2 weeks for a total of 8 times to receive these drugs. The intent of treatment is cure. You will see the doctor regularly throughout treatment.  We will obtain blood work from you prior to every treatment and monitor your results to make sure it is safe to give your treatment. The doctor monitors your response to treatment by the way you are feeling, your blood work, and by obtaining scans periodically.  There will be wait times while you are here for treatment.  It will take about 30 minutes to 1 hour for your lab work to result.  Then there will be wait times while pharmacy mixes your medications.    Medications you will receive in the clinic prior to your chemotherapy medications:  Aloxi:  ALOXI is used in adults to help prevent nausea and vomiting that happens with certain chemotherapy drugs.  Aloxi is a long acting medication, and will remain in your system for about two days.   Emend:  This is an anti-nausea medication that is used with Aloxi to help prevent nausea and vomiting caused by chemotherapy.  Dexamethasone:  This is a steroid given prior to chemotherapy to help prevent allergic reactions; it may also help prevent and control nausea and diarrhea.     Oxaliplatin (Eloxatin)  About This Drug  Oxaliplatin is used to treat cancer. It is given in the vein (IV).  It takes two hours to infuse.  Possible Side Effects   Bone marrow suppression. This is a decrease in the number of white blood cells, red blood cells, and platelets. This may raise  your risk of infection, make you tired and weak (fatigue), and raise your risk of bleeding.   Tiredness   Soreness of the mouth and throat. You may have red areas, white patches, or sores that hurt.   Nausea and vomiting (throwing up)   Diarrhea (loose bowel movements)   Changes in your liver function   Effects on the nerves called peripheral neuropathy. You may feel numbness, tingling, or pain in your hands and feet, and may be worse in cold temperatures. It may be hard for you to button your clothes, open jars, or walk as usual. The effect on the nerves may get worse with more doses of the drug. These effects get better in some people after the drug is stopped but it does not get better in all people  Note: Each of the side effects above was reported in 40% or greater of patients treated with oxaliplatin. Not all possible side effects are included above.   Warnings and Precautions   Allergic reactions, including anaphylaxis, which may be life-threatening are rare but may happen in some patients. Signs of allergic reaction to this drug may be swelling of the face, feeling like your tongue or throat are swelling, trouble breathing, rash, itching, fever, chills, feeling dizzy, and/or feeling that your heart is beating in a fast or not normal way. If this happens, do not take another dose of this drug. You should get urgent medical treatment.  Inflammation (swelling) of the lungs, which may be life-threatening. You may have a dry cough or trouble breathing.   Effects on the nerves (neuropathy) may resolve within 14 days, or it may persist beyond 14 days.   Severe decrease in white blood cells when combined with the chemotherapy agents 5-fluorouracil and leucovorin. This may be life-threatening.   Severe changes in your liver function   Abnormal heart beat and/or EKG, which can be life-threatening   Rhabdomyolysis- damage to your muscles which may release proteins in your blood and  affect how your kidneys work, which can be life-threatening. You may have severe muscle weakness and/or pain, or dark urine.  Important Information   This drug may impair your ability to drive or use machinery. Talk to your doctor and/or nurse about precautions you may need to take.   This drug may be present in the saliva, tears, sweat, urine, stool, vomit, semen, and vaginal secretions. Talk to your doctor and/or your nurse about the necessary precautions to take during this time.  * The effects on the nerves can be aggravated by exposure to cold. Avoid cold beverages, use of ice, and make sure you cover your skin and dress warmly prior to being exposed to cold temperatures while you are receiving treatment with oxaliplatin*   Treating Side Effects   Manage tiredness by pacing your activities for the day.   Be sure to include periods of rest between energy-draining activities.   To decrease the risk of infection, wash your hands regularly.   Avoid close contact with people who have a cold, the flu, or other infections.  Take your temperature as your doctor or nurse tells you, and whenever you feel like you may have a fever.   To help decrease the risk of bleeding, use a soft toothbrush. Check with your nurse before using dental floss.   Be very careful when using knives or tools.   Use an electric shaver instead of a razor.   Drink plenty of fluids (a minimum of eight glasses per day is recommended).   Mouth care is very important. Your mouth care should consist of routine, gentle cleaning of your teeth or dentures and rinsing your mouth with a mixture of 1/2 teaspoon of salt in 8 ounces of water or 1/2 teaspoon of baking soda in 8 ounces of water. This should be done at least after each meal and at bedtime.   If you have mouth sores, avoid mouthwash that has alcohol. Also avoid alcohol and smoking because they can bother your mouth and throat.   To help with nausea and vomiting,  eat small, frequent meals instead of three large meals a day. Choose foods and drinks that are at room temperature. Ask your nurse or doctor about other helpful tips and medicine that is available to help stop or lessen these symptoms.   If you throw up or have loose bowel movements, you should drink more fluids so that you do not become dehydrated (lack of water in the body from losing too much fluid).   If you have diarrhea, eat low-fiber foods that are high in protein and calories and avoid foods that can irritate your digestive tracts or lead to cramping.   Ask your nurse or doctor about medicine that can lessen or stop your diarrhea.   If you have numbness and tingling in your hands and feet, be careful when cooking, walking, and handling sharp objects and hot liquids.   Do not drink cold  drinks or use ice in beverages. Drink fluids at room temperature or warmer, and drink through a straw.   Wear gloves to touch cold objects, and wear warm clothing and cover you skin during cold weather.   Food and Drug Interactions   There are no known interactions of oxaliplatin with food and other medications.   This drug may interact with other medicines. Tell your doctor and pharmacist about all the prescription and over-the-counter medicines and dietary supplements (vitamins, minerals, herbs and others) that you are taking at this time. Also, check with your doctor or pharmacist before starting any new prescription or over-the-counter medicines, or dietary supplements to make sure that there are no interactions   When to Call the Doctor  Call your doctor or nurse if you have any of these symptoms and/or any new or unusual symptoms:   Fever of 100.4 F (38 C) or higher   Chills   Tiredness that interferes with your daily activities   Feeling dizzy or lightheaded   Easy bleeding or bruising   Feeling that your heart is beating in a fast or not normal way (palpitations)   Pain in your  chest   Dry cough   Trouble breathing   Pain in your mouth or throat that makes it hard to eat or drink   Nausea that stops you from eating or drinking and/or is not relieved by prescribed medicines   Throwing up   Diarrhea, 4 times in one day or diarrhea with lack of strength or a feeling of being dizzy   Numbness, tingling, or pain in your hands and feet   Signs of possible liver problems: dark urine, pale bowel movements, bad stomach pain, feeling very tired and weak, unusual itching, or yellowing of the eyes or skin   Signs of rhabdomyolysis: decreased urine, very dark urine, muscle pain in the shoulders, thighs, or lower back; muscle weakness or trouble moving arms and legs   Signs of allergic reaction: swelling of the face, feeling like your tongue or throat are swelling, trouble breathing, rash, itching, fever, chills, feeling dizzy, and/or feeling that your heart is beating in a fast or not normal way. If this happens, call 911 for emergency care.   If you think you may be pregnant  Reproduction Warnings   Pregnancy warning: This drug may have harmful effects on the unborn baby. Women of childbearing potential should use effective methods of birth control during your cancer treatment. Let your doctor know right away if you think you may be pregnant or may have impregnated your partner.   Breastfeeding warning: It is not known if this drug passes into breast milk. For this reason, women should talk to their doctor about the risks and benefits of breastfeeding during treatment with this drug because this drug may enter the breast milk and cause harm to a breastfeeding baby.   Fertility warning: Human fertility studies have not been done with this drug. Talk with your doctor or nurse if you plan to have children. Ask for information on sperm or egg banking.   Irinotecan (Camptosar)    About This Drug  Irinotecan is used to treat cancer. It is given in the vein (IV). It will  take 1.5 hours to infuse.    Possible Side Effects   Bone marrow suppression. This is a decrease in the number of white blood cells, red blood cells, and platelets. This may raise your risk of infection, make you tired and weak, and raise  your risk of bleeding.    Soreness of the mouth and throat. You may have red areas, white patches, or sores that hurt.    Nausea and vomiting (throwing up)    Pain in your abdomen  Diarrhea (loose bowel movements)    Constipation (unable to move bowels)    Decreased appetite (decreased hunger)    Weight loss    Changes in your liver function    Pain    Weakness    Fever    Infection    Hair loss. Hair loss is often temporary, although with certain medicine, hair loss can sometimes be permanent. Hair loss may happen suddenly or gradually. If you lose hair, you may lose it from your head, face, armpits, pubic area, chest, and/or legs. You may also notice your hair getting thin.   Note: Each of the side effects above was reported in 30% or greater of patients treated with irinotecan alone or in combination with other chemotherapy. Not all possible side effects are included above.   Warnings and Precautions    Severe diarrhea and colitis which is swelling (inflammation) in the colon - symptoms are diarrhea, stomach cramping, and sometimes blood in the bowel movements. Very rarely, an abnormal hole in your stomach, small and/or large intestine can happen. Diarrhea can begin shortly after the infusion or up to a week or two after, and can be life-threatening if it leads to dehydration, and other complications.     Severe bone marrow suppression which can be life-threatening    Allergic reactions, including anaphylaxis are rare but may happen in some patients. Signs of allergic reaction to this drug may be swelling of the face, feeling like your tongue or throat are swelling, trouble breathing, rash, itching, fever, chills, feeling dizzy, and/or  feeling that your heart is beating in a fast or not normal way. If this happens, do not take another dose of this drug. You should get urgent medical treatment.    Changes in your kidney function which can cause kidney failure and be life-threatening    Inflammation (swelling) or scarring of the lungs, which can be life-threatening. You may have a cough or trouble breathing. Note: Some of the side effects above are very rare. If you have concerns and/or questions, please discuss them with your medical team. Important Information    This drug may be present in the saliva, tears, sweat, urine, stool, vomit, semen, and vaginal secretions. Talk to your doctor and/or your nurse about the necessary precautions to take during this time.    It is important that you notify your doctor and/or nurse at the first sign of diarrhea, so they can provide you with anti-diarrheal medication and give you further instructions. Notify your doctor and/ or nurse if you are taking anti-diarrheal medication and your symptoms have not improved or are worsening.    Treating Side Effects    Manage tiredness by pacing your activities for the day.    Be sure to include periods of rest between energy-draining activities.    To decrease the risk of infection, wash your hands regularly.  Avoid close contact with people who have a cold, the flu, or other infections.    Take your temperature as your doctor or nurse tells you, and whenever you feel like you may have a fever.    To help decrease the risk of bleeding, use a soft toothbrush. Check with your nurse before using dental floss.    Be very careful when  using knives or tools.    Use an electric shaver instead of a razor.    Drink plenty of fluids (a minimum of eight glasses per day is recommended).    If you throw up or have diarrhea, you should drink more fluids so that you do not become dehydrated (lack of water in the body from losing too much fluid).    Mouth  care is very important. Your mouth care should consist of routine, gentle cleaning of your teeth or dentures and rinsing your mouth with a mixture of 1/2 teaspoon of salt in 8 ounces of water or 1/2 teaspoon of baking soda in 8 ounces of water. This should be done at least after each meal and at bedtime.    If you have mouth sores, avoid mouthwash that has alcohol. Also avoid alcohol and smoking because they can bother your mouth and throat.     To help with nausea and vomiting, eat small, frequent meals instead of three large meals a day. Choose foods and drinks that are at room temperature. Ask your nurse or doctor about other helpful tips and medicine that is available to help stop or lessen these symptoms.    If you have diarrhea, eat low-fiber foods that are high in protein and calories and avoid foods that can irritate your digestive tracts or lead to cramping.    If you are not able to move your bowels, check with your doctor or nurse before you use enemas, laxatives, or suppositories.    Ask your doctor or nurse about medicines that are available to help stop or lessen constipation and/ or diarrhea.    To help with decreased appetite, eat small, frequent meals. Eat foods high in calories and protein, such as meat, poultry, fish, dry beans, tofu, eggs, nuts, milk, yogurt, cheese, ice cream, pudding, and nutritional supplements.    Consider using sauces and spices to increase taste. Daily exercise, with your doctor's approval, may increase your appetite.    To help with weight loss, drink fluids that contribute calories (whole milk, juice, soft drinks, sweetened beverages, milkshakes, and nutritional supplements) instead of water.    Keeping your pain under control is important to your well-being. Please tell your doctor or nurse if you are experiencing pain.    To help with hair loss, wash with a mild shampoo and avoid washing your hair every day. Avoid coloring your hair.    Avoid  rubbing your scalp, pat your hair or scalp dry.    Limit your use of hair spray, electric curlers, blow dryers, and curling irons.    If you are interested in getting a wig, talk to your nurse and they can help you get in touch with programs in your local area.    Food and Drug Interactions    This drug may interact with grapefruit and grapefruit juice. Talk to your doctor as this could make side effects worse.    Check with your doctor or pharmacist about all other prescription medicines and over-the-counter medicines and dietary supplements (vitamins, minerals, herbs, and others) you are taking before starting this medicine as there are known drug interactions with irinotecan. Also, check with your doctor or pharmacist before starting any new prescription or over-the-counter medicines, or dietary supplements to make sure that there are no interactions.    Avoid the use of St. John's Wort while taking irinotecan as this may lower the levels of the drug in your body, which can make it less  effective.    When to Call the Doctor   Call your doctor or nurse if you have any of these symptoms and/or any new or unusual symptoms:    Fever of 100.4 F (38 C) or higher    Chills    Pain in your chest     Dry cough    Trouble breathing and/or wheezing    Feeling dizzy or lightheaded    Easy bleeding or bruising    Tiredness or weakness that interferes with your daily activities    Pain in your mouth or throat that makes it hard to eat or drink    Nausea that stops you from eating or drinking and/or is not relieved by prescribed medicines    Throwing up   Diarrhea, 4 times in one day or diarrhea with lack of strength or a feeling of being dizzy    No bowel movement in 3 days or when you feel uncomfortable    Severe abdominal pain that does not go away    Difficulty swallowing    General pain that does not go away, or is not relieved by prescribed medicines    Blood in your  stool    Lasting loss of appetite or rapid weight loss of five pounds in a week    Decreased or very dark urine    Signs of allergic reaction: swelling of the face, feeling like your tongue or throat are swelling, trouble breathing, rash, itching, fever, chills, feeling dizzy, and/or feeling that your heart is beating in a fast or not normal way. If this happens, call 911 for emergency care.    Signs of possible liver problems: dark urine, pale bowel movements, pain in your abdomen, feeling very tired and weak, unusual itching, or yellowing of the eyes or skin    If you think you may be pregnant or may have impregnated your partner    Reproduction Warnings    Pregnancy warning: This drug can have harmful effects on the unborn baby. Women of childbearing potential should use effective methods of birth control during your cancer treatment and for 6 months after treatment. Men with female partners of childbearing potential should use effective methods of birth control during your cancer treatment and for 3 months after your cancer treatment. Let your doctor know right away if you think you may be pregnant or may have impregnated your partner.    Breastfeeding warning: Women should not breastfeed during treatment and for at least 7 days after treatment because this drug can enter the breast milk and cause harm to a breastfeeding baby.  Fertility warning: In men and women both, this drug may affect your ability to have children in the future. Talk with your doctor or nurse if you plan to have children. Ask for information on sperm or egg banking. Revised August 2021   Leucovorin Calcium  About This Drug  Leucovorin is a vitamin. It is used in combination with other cancer fighting drugs such as 5-fluorouracil and methotrexate. Leucovorin is given in the vein (IV).  This drug runs at the same time as the oxaliplatin and takes 2 hours to infuse.   Possible Side Effects  Rash and itching  Note:  Leucovorin by itself has very few side effects. Other side effects you may have can be caused by the other drugs you are taking, such as 5-fluorouracil.    Warnings and Precautions   Allergic reactions, including anaphylaxis are rare but may happen in some patients.  Signs of allergic reaction to this drug may be swelling of the face, feeling like your tongue or throat are swelling, trouble breathing, rash, itching, fever, chills, feeling dizzy, and/or feeling that your heart is beating in a fast or not normal way. If this happens, do not take another dose of this drug. You should get urgent medical treatment.   Food and Drug Interactions   There are no known interactions of leucovorin with food.   This drug may interact with other medicines. Tell your doctor and pharmacist about all the prescription and over-the-counter medicines and dietary supplements (vitamins, minerals, herbs and others) that you are taking at this time.   Also, check with your doctor or pharmacist before starting any new prescription or over-the-counter medicines, or dietary supplements to make sure that there are no interactions.   When to Call the Doctor  Call your doctor or nurse if you have any of these symptoms and/or any new or unusual symptoms:   A new rash or a rash that is not relieved by prescribed medicines   Signs of allergic reaction: swelling of the face, feeling like your tongue or throat are swelling, trouble breathing, rash, itching, fever, chills, feeling dizzy, and/or feeling that your heart is beating in a fast or not normal way. If this happens, call 911 for emergency care.   If you think you may be pregnant   Reproduction Warnings   Pregnancy warning: It is not known if this drug may harm an unborn child. For this reason, be sure to talk with your doctor if you are pregnant or planning to become pregnant while receiving this drug. Let your doctor know right away if you think you may be  pregnant   Breastfeeding warning: It is not known if this drug passes into breast milk. For this reason, women should talk to their doctor about the risks and benefits of breastfeeding during treatment with this drug because this drug may enter the breast milk and cause harm to a breastfeeding baby.   Fertility warning: Human fertility studies have not been done with this drug. Talk with your doctor or nurse if you plan to have children. Ask for information on sperm or egg banking.   5-Fluorouracil (Adrucil; 5FU)  About This Drug  Fluorouracil is used to treat cancer. It is given in the vein (IV). It is given as an IV push from a syringe and also as a continuous infusion given via an ambulatory pump (a pump you take home and wear for a specified amount of time).  Possible Side Effects   Bone marrow suppression. This is a decrease in the number of white blood cells, red blood cells, and platelets. This may raise your risk of infection, make you tired and weak (fatigue), and raise your risk of bleeding   Changes in the tissue of the heart and/or heart attack. Some changes may happen that can cause your heart to have less ability to pump blood.   Blurred vision or other changes in eyesight   Nausea and throwing up (vomiting)   Diarrhea (loose bowel movements)   Ulcers - sores that may cause pain or bleeding in your digestive tract, which includes your mouth, esophagus, stomach, small/large intestines and rectum   Soreness of the mouth and throat. You may have red areas, white patches, or sores that hurt.   Allergic reactions, including anaphylaxis are rare but may happen in some patients. Signs of allergic reaction to this drug may be swelling  of the face, feeling like your tongue or throat are swelling, trouble breathing, rash, itching, fever, chills, feeling dizzy, and/or feeling that your heart is beating in a fast or not normal way. If this happens, do not take another dose of this  drug. You should get urgent medical treatment.   Sensitivity to light (photosensitivity). Photosensitivity means that you may become more sensitive to the sun and/or light. You may get a skin rash/reaction if you are in the sun or are exposed to sun lamps and tanning beds. Your eyes may water more, mostly in bright light.   Changes in your nail color, nail loss and/or brittle nail   Darkening of the skin, or changes to the color of your skin and/or veins used for infusion   Rash, dry skin, or itching  Note: Not all possible side effects are included above.  Warnings and Precautions   Hand-and-foot syndrome. The palms of your hands or soles of your feet may tingle, become numb, painful, swollen, or red.   Changes in your central nervous system can happen. The central nervous system is made up of your brain and spinal cord. You could feel extreme tiredness, agitation, confusion, hallucinations (see or hear things that are not there), trouble understanding or speaking, loss of control of your bowels or bladder, eyesight changes, numbness or lack of strength to your arms, legs, face, or body, or coma. If you start to have any of these symptoms let your doctor know right away.   Side effects of this drug may be unexpectedly severe in some patients  Note: Some of the side effects above are very rare. If you have concerns and/or questions, please discuss them with your medical team.   Important Information   This drug may be present in the saliva, tears, sweat, urine, stool, vomit, semen, and vaginal secretions. Talk to your doctor and/or your nurse about the necessary precautions to take during this time.   Treating Side Effects   Manage tiredness by pacing your activities for the day.   Be sure to include periods of rest between energy-draining activities.   To help decrease the risk of infections, wash your hands regularly.   Avoid close contact with people who have a cold, the flu,  or other infections.   Take your temperature as your doctor or nurse tells you, and whenever you feel like you may have a fever.   Use a soft toothbrush. Check with your nurse before using dental floss.   Be very careful when using knives or tools.   Use an electric shaver instead of a razor.   If you have a nose bleed, sit with your head tipped slightly forward. Apply pressure by lightly pinching the bridge of your nose between your thumb and forefinger. Call your doctor if you feel dizzy or faint or if the bleeding doesn't stop after 10 to 15 minutes.   Drink plenty of fluids (a minimum of eight glasses per day is recommended).   If you throw up or have loose bowel movements, you should drink more fluids so that you do not  become dehydrated (lack of water in the body from losing too much fluid).   To help with nausea and vomiting, eat small, frequent meals instead of three large meals a day. Choose foods and drinks that are at room temperature. Ask your nurse or doctor about other helpful tips and medicine that is available to help, stop, or lessen these symptoms.   If you  have diarrhea, eat low-fiber foods that are high in protein and calories and avoid foods that can irritate your digestive tracts or lead to cramping.   Ask your nurse or doctor about medicine that can lessen or stop your diarrhea.   Mouth care is very important. Your mouth care should consist of routine, gentle cleaning of your teeth or dentures and rinsing your mouth with a mixture of 1/2 teaspoon of salt in 8 ounces of water or 1/2 teaspoon of baking soda in 8 ounces of water. This should be done at least after each meal and at bedtime.   If you have mouth sores, avoid mouthwash that has alcohol. Also avoid alcohol and smoking because they can bother your mouth and throat.   Keeping your nails moisturized may help with brittleness.   To help with itching, moisturize your skin several times day.   Use sunscreen  with SPF 30 or higher when you are outdoors even for a short time. Cover up when you are out in the sun. Wear wide-brimmed hats, long-sleeved shirts, and pants. Keep your neck, chest, and back covered. Wear dark sun glasses when in the sun or bright lights.   If you get a rash do not put anything on it unless your doctor or nurse says you may. Keep the area around the rash clean and dry. Ask your doctor for medicine if your rash bothers you.   Keeping your pain under control is important to your well-being. Please tell your doctor or nurse if you are experiencing pain.   Food and Drug Interactions   There are no known interactions of fluorouracil with food.   Check with your doctor or pharmacist about all other prescription medicines and over-the-counter medicines and dietary supplements (vitamins, minerals, herbs and others) you are taking before starting this medicine as there are known drug interactions with 5-fluoroucacil. Also, check with your doctor or pharmacist before starting any new prescription or over-the-counter medicines, or dietary supplements to make sure that there are no interactions.   When to Call the Doctor  Call your doctor or nurse if you have any of these symptoms and/or any new or unusual symptoms:   Fever of 100.4 F (38 C) or higher   Chills   Easy bleeding or bruising   Nose bleed that doesn't stop bleeding after 10-15 minutes   Trouble breathing   Feeling dizzy or lightheaded   Feeling that your heart is beating in a fast or not normal way (palpitations)   Chest pain or symptoms of a heart attack. Most heart attacks involve pain in the center of the chest that lasts more than a few minutes. The pain may go away and come back or it can be constant. It can feel like pressure, squeezing, fullness, or pain. Sometimes pain is felt in one or both arms, the back, neck, jaw, or stomach. If any of these symptoms last 2 minutes, call 911.   Confusion and/or  agitation   Hallucinations   Trouble understanding or speaking   Loss of control of bowels or bladder   Blurry vision or changes in your eyesight   Headache that does not go away   Numbness or lack of strength to your arms, legs, face, or body   Nausea that stops you from eating or drinking and/or is not relieved by prescribed medicines   Throwing up    Diarrhea, 4 times in one day or diarrhea with lack of strength or a feeling of being  dizzy   Pain in your mouth or throat that makes it hard to eat or drink   Pain along the digestive tract - especially if worse after eating   Blood in your vomit (bright red or coffee-ground) and/or stools (bright red, or black/tarry)   Coughing up blood   Tiredness that interferes with your daily activities   Painful, red, or swollen areas on your hands or feet or around your nails   A new rash or a rash that is not relieved by prescribed medicines   Develop sensitivity to sunlight/light   Numbness and/or tingling of your hands and/or feet   Signs of allergic reaction: swelling of the face, feeling like your tongue or throat are swelling, trouble breathing, rash, itching, fever, chills, feeling dizzy, and/or feeling that your heart is beating in a fast or not normal way. If this happens, call 911 for emergency care.   If you think you are pregnant or may have impregnated your partner  Reproduction Warnings   Pregnancy warning: This drug may have harmful effects on the unborn baby. Women of child bearing potential should use effective methods of birth control during your cancer treatment and 3 months after treatment. Men with female partners of childbearing potential should use effective methods of birth control during your cancer treatment and for 3 months after your cancer treatment. Let your doctor know right away if you think you may be pregnant or may have impregnated your partner.   Breastfeeding warning: It is not known if this  drug passes into breast milk. For this reason, Women should not breastfeed during treatment because this drug could enter the breast milk and cause harm to a breastfeeding baby.   Fertility warning: In men and women both, this drug may affect your ability to have children in the future. Talk with your doctor or nurse if you plan to have children. Ask for information on sperm or egg banking.   SELF CARE ACTIVITIES WHILE ON CHEMOTHERAPY/IMMUNOTHERAPY:  Hydration Increase your fluid intake and drink at least 2 liters (64 ounces) of water/decaffeinated beverages per day after treatment. You can still have your cup of coffee or soda but these beverages do not count as part of your 2 liters that you need to drink daily. No alcohol intake.  Medications Continue taking your normal prescription medication as prescribed.  If you start any new herbal or new supplements please let us know first to make sure it is safe.  Mouth Care Have teeth cleaned professionally before starting treatment. Keep dentures and partial plates clean. Use soft toothbrush and do not use mouthwashes that contain alcohol. Biotene is a good mouthwash that is available at most pharmacies or may be ordered by calling [phone]. Use warm salt water gargles (1 teaspoon salt per 1 quart warm water) before and after meals and at bedtime. Or you may rinse with 2 tablespoons of three-percent hydrogen peroxide mixed in eight ounces of water. If you are still having problems with your mouth or sores in your mouth please call the clinic. If you need dental work, please let the doctor know before you go for your appointment so that we can coordinate the best possible time for you in regards to your chemo regimen. You need to also let your dentist know that you are actively taking chemo. We may need to do labs prior to your dental appointment.  Skin Care Always use sunscreen that has not expired and with SPF Nancy Fetter Protection Factor) of  50 or  higher. Wear hats to protect your head from the sun. Remember to use sunscreen on your hands, ears, face, & feet.  Use good moisturizing lotions such as udder cream, eucerin, or even Vaseline. Some chemotherapies can cause dry skin, color changes in your skin and nails.    Avoid long, hot showers or baths. Use gentle, fragrance-free soaps and laundry detergent. Use moisturizers, preferably creams or ointments rather than lotions because the thicker consistency is better at preventing skin dehydration. Apply the cream or ointment within 15 minutes of showering. Reapply moisturizer at night, and moisturize your hands every time after you wash them.   Infection Prevention Please wash your hands for at least 30 seconds using warm soapy water. Handwashing is the #1 way to prevent the spread of germs. Stay away from sick people or people who are getting over a cold. If you develop respiratory systems such as green/yellow mucus production or productive cough or persistent cough let us know and we will see if you need an antibiotic. It is a good idea to keep a pair of gloves on when going into grocery stores/[employer] to decrease your risk of coming into contact with germs on the carts, etc. Carry alcohol hand gel with you at all times and use it frequently if out in public. If your temperature reaches 100.5 or higher please call the clinic and let us know.  If it is after hours or on the weekend please go to the ER if your temperature is over 100.4.  Please have your own personal thermometer at home to use.    Sex and bodily fluids If you are going to have sex, a condom must be used to protect the person that isn't taking immunotherapy. For a few days after treatment, immunotherapy can be excreted through your bodily fluids.  When using the toilet please close the lid and flush the toilet twice.  Do this for a few day after you have had immunotherapy.   Contraception It is not known for sure whether or not  immunotherapy drugs can be passed on through semen or secretions from the vagina. Because of this some doctors advise people to use a barrier method if you have sex during treatment. This applies to vaginal, anal or oral sex.  Generally, doctors advise a barrier method only for the time you are actually having the treatment and for about a week after your treatment.  Advice like this can be worrying, but this does not mean that you have to avoid being intimate with your partner. You can still have close contact with your partner and continue to enjoy sex.  Animals If you have cats or birds we just ask that you not change the litter or change the cage.  Please have someone else do this for you while you are on immunotherapy.   Food Safety During and After Cancer Treatment Food safety is important for people both during and after cancer treatment. Cancer and cancer treatments, such as chemotherapy, radiation therapy, and stem cell/bone marrow transplantation, often weaken the immune system. This makes it harder for your body to protect itself from foodborne illness, also called food poisoning. Foodborne illness is caused by eating food that contains harmful bacteria, parasites, or viruses.  Foods to avoid Some foods have a higher risk of becoming tainted with bacteria. These include: Unwashed fresh fruit and vegetables, especially leafy vegetables that can hide dirt and other contaminants Raw sprouts, such as alfalfa sprouts Raw or undercooked  beef, especially ground beef, or other raw or undercooked meat and poultry Fatty, fried, or spicy foods immediately before or after treatment.  These can sit heavy on your stomach and make you feel nauseous. Raw or undercooked shellfish, such as oysters. Sushi and sashimi, which often contain raw fish.  Unpasteurized beverages, such as unpasteurized fruit juices, raw milk, raw yogurt, or cider Undercooked eggs, such as soft boiled, over easy, and poached;  raw, unpasteurized eggs; or foods made with raw egg, such as homemade raw cookie dough and homemade mayonnaise  Simple steps for food safety  Shop smart. Do not buy food stored or displayed in an unclean area. Do not buy bruised or damaged fruits or vegetables. Do not buy cans that have cracks, dents, or bulges. Pick up foods that can spoil at the end of your shopping trip and store them in a cooler on the way home.  Prepare and clean up foods carefully. Rinse all fresh fruits and vegetables under running water, and dry them with a clean towel or paper towel. Clean the top of cans before opening them. After preparing food, wash your hands for 20 seconds with hot water and soap. Pay special attention to areas between fingers and under nails. Clean your utensils and dishes with hot water and soap. Disinfect your kitchen and cutting boards using 1 teaspoon of liquid, unscented bleach mixed into 1 quart of water.    Dispose of old food. Eat canned and packaged food before its expiration date (the "use by" or "best before" date). Consume refrigerated leftovers within 3 to 4 days. After that time, throw out the food. Even if the food does not smell or look spoiled, it still may be unsafe. Some bacteria, such as Listeria, can grow even on foods stored in the refrigerator if they are kept for too long.  Take precautions when eating out. At restaurants, avoid buffets and salad bars where food sits out for a long time and comes in contact with many people. Food can become contaminated when someone with a virus, often a norovirus, or another "bug" handles it. Put any leftover food in a "to-go" container yourself, rather than having the server do it. And, refrigerate leftovers as soon as you get home. Choose restaurants that are clean and that are willing to prepare your food as you order it cooked.    SYMPTOMS TO REPORT AS SOON AS POSSIBLE AFTER TREATMENT:  FEVER GREATER THAN 100.4 F CHILLS WITH  OR WITHOUT FEVER NAUSEA AND VOMITING THAT IS NOT CONTROLLED WITH YOUR NAUSEA MEDICATION UNUSUAL SHORTNESS OF BREATH UNUSUAL BRUISING OR BLEEDING TENDERNESS IN MOUTH AND THROAT WITH OR WITHOUT PRESENCE OF ULCERS URINARY PROBLEMS BOWEL PROBLEMS UNUSUAL RASH     Wear comfortable clothing and clothing appropriate for easy access to any Portacath or PICC line. Let us know if there is anything that we can do to make your therapy better!   What to do if you need assistance after hours or on the weekends: CALL [phone].  HOLD on the line, do not hang up.  You will hear multiple messages but at the end you will be connected with a nurse triage line.  They will contact the doctor if necessary.  Most of the time they will be able to assist you.  Do not call the hospital operator.    I have been informed and understand all of the instructions given to me and have received a copy. I have been instructed to call the clinic [phone] or my family physician as soon as possible for continued medical care, if indicated. I do not have any more questions at this time but understand that I may call the [facility] or the Patient Navigator at [phone] during office hours should I have questions or need assistance in obtaining follow-up care.

## 2023-01-08 ENCOUNTER — Encounter: Payer: Self-pay | Admitting: Hematology

## 2023-01-08 LAB — METHYLMALONIC ACID, SERUM: Methylmalonic Acid, Quantitative: 222 nmol/L (ref 0–378)

## 2023-01-08 NOTE — Progress Notes (Signed)
[facility]  Initial Assessment   [name] is a 67 y.o. year old female contacted by phone. Clinical Social Work was referred by medical provider for assessment of psychosocial needs.   SDOH (Social Determinants of Health) assessments performed: Yes SDOH Interventions    Flowsheet Row Office Visit from 01/04/2023 in [place] at [place] Interventions   Food Insecurity Interventions Intervention Not Indicated  Housing Interventions Intervention Not Indicated  Transportation Interventions Intervention Not Indicated  Utilities Interventions Intervention Not Indicated       SDOH Screenings   Food Insecurity: No Food Insecurity (01/06/2023)  Housing: Low Risk  (01/06/2023)  Transportation Needs: No Transportation Needs (01/06/2023)  Utilities: Not At Risk (01/06/2023)  Depression (PHQ2-9): Low Risk  (01/06/2023)  Tobacco Use: Low Risk  (01/04/2023)     Distress Screen completed: No     No data to display            Family/Social Information:  Housing Arrangement: patient lives with her daughter April and April's 2 sons who are ages 3 and 56.  Pt also has custody of another grandson who is 14.  Family members/support persons in your life? Pt's daughter and grandchildren are her primary source of support.  Pt's daughter states pt at present is very fatigued and unable to ambulate distance outside of the home.  Pt is able to drive, but her 31 year old grandson has been assisting as it allows him to practice driving.  Pt is trying to keep most appointments in [place] for this reason as her grandson is most comfortable driving in that area. Transportation concerns: At present there are no transportation concerns, but if pt has difficulty with transportation for daily radiation she is aware of the transportation service offered through [facility] and will reach out to arrange.  Employment: Retired Freight forwarder at [employer].  Income source:  Paediatric nurse concerns:  At present pt is able to pay her bills; however, there is little flexibility for unexpected expenses. Type of concern: Medical bills Food access concerns: no Religious or spiritual practice: Yes-Christian Services Currently in place:  none  Coping/ Adjustment to diagnosis: Patient understands treatment plan and what happens next? yes Concerns about diagnosis and/or treatment: Overwhelmed by information, How I will pay for the services I need, and Quality of life Patient reported stressors: Finances, Anxiety/ nervousness, and Adjusting to my illness Hopes and/or priorities: Pt's priority is to start treatment w/ the hope of positive results. Patient enjoys time with family/ friends Current coping skills/ strengths: Capable of independent living , Motivation for treatment/growth , and Supportive family/friends     SUMMARY: Current SDOH Barriers:  Financial constraints related to fixed income.  Clinical Social Work Clinical Goal(s):  Explore community resource options for unmet needs related to:  Financial Strain   Interventions: Discussed common feeling and emotions when being diagnosed with cancer, and the importance of support during treatment Informed patient of the support team roles and support services at [facility] Provided [facility] contact information and encouraged patient to call with any questions or concerns Referred patient to financial counselor to apply for the [employer].  Emailed resources for talking to the grandchildren to pt's daughter, as well as contact information for the [employer] to acquire DME if needed.  Pt has booked an appointment for next week to complete Advance Directives.   Follow Up Plan: Patient will come to support center to complete Advance Directives Patient verbalizes  understanding of plan: Yes    Henriette Combs, LCSW

## 2023-01-10 LAB — COPPER, SERUM: Copper: 129 ug/dL (ref 80–158)

## 2023-01-11 ENCOUNTER — Telehealth: Payer: Self-pay | Admitting: Dietician

## 2023-01-11 ENCOUNTER — Inpatient Hospital Stay: Payer: Medicare HMO | Admitting: Dietician

## 2023-01-11 NOTE — Progress Notes (Signed)
See telephone note.

## 2023-01-11 NOTE — Telephone Encounter (Signed)
Nutrition Assessment   Reason for Assessment: New Rectal Cancer   ASSESSMENT: 67 year old female with newly diagnosed stage Ilc rectal cancer + IDA. Patient currently receiving IV Venofer x3 (first 2/14). She is planning to start neoadjuvant chemotherapy with FOLFOXIRI with dose reduced oxaliplatin (first 2/28). Patient is under the care of Dr. Delton Coombes.   Past medical history IDDM2, vitamin D deficiency, B12 deficiency, diabetic peripheral neuropathy, osteoarthritis, GERD, HTN, anxiety, morbid obesity  Spoke with patient via telephone. She reports loss of interest in eating over the last 2 months. Patient recalls giving away her food to the kids after 2-3 bites. She reports occasionally feeling queasy with intake. Patient denies early satiety. Patient reports one small meal and snacks. She has never been a breakfast person, reports having a few chips with morning medications. Recalls hamburger patty with cole slaw for lunch yesterday. She had a few more chips yesterday evening. Patient reports drinking water through out the day. She always has a bottle in her hand. Patient is having bleeding with bowel movements. This has been ongoing ~10 years. She reports worsening pain with BM's the last 2 months. Patient is having multiple BM's daily. Some loose/liquid and some are formed.     Nutrition Focused Physical Exam: unable to complete (telephone visit)    Medications: lyrica, D3, B12, ferrous sulfate, novolog, lantus, mounjaro, metformin, mag-ox   Labs: 2/1 HgbA1c - 6.6   Anthropometrics:   Height: 5'7" Weight: 293 lb 2 oz UBW: 303 lb  BMI: 45.91    NUTRITION DIAGNOSIS: Food and nutrition related knowledge deficit related to newly diagnosed rectal cancer as evidenced by no prior need to associated nutrition information   INTERVENTION:  Educated on low fiber diet to reduce risk of bowel blockage/obstruction. Discussed foods low/high fiber foods as well as other foods to limit/avoid  including fried foods, processed meats, nuts/seeds, raw fruits/vegetables  Discussed importance of adequate intake of calories and protein to preserve lean body mass during treatment as well as to support post operative healing  Encouraged small frequent meals/snacks. Educated on foods with protein, recommend protein source with each meal/snack Discussed possible cold sensitivity side effects of oxaliplatin - will provided handout + warm supplement ideas Suggested oral nutrition supplement for added calories/protein - samples left for pt to try Contact information provided  Patient to pick up handouts + samples at chemo ed appointment 2/21   MONITORING, EVALUATION, GOAL: Patient will tolerate adequate calories and protein to minimize loss of lean body mass during treatment    Next Visit: Thursday March 6 via telephone

## 2023-01-12 ENCOUNTER — Other Ambulatory Visit: Payer: Self-pay | Admitting: Radiology

## 2023-01-13 ENCOUNTER — Other Ambulatory Visit: Payer: Self-pay | Admitting: Radiology

## 2023-01-13 ENCOUNTER — Telehealth (HOSPITAL_COMMUNITY): Payer: Self-pay

## 2023-01-13 ENCOUNTER — Ambulatory Visit (HOSPITAL_COMMUNITY)
Admission: RE | Admit: 2023-01-13 | Discharge: 2023-01-13 | Disposition: A | Discharge status: Home / Self Care | Payer: Medicare HMO | Source: Ambulatory Visit | Attending: Hematology | Admitting: Hematology

## 2023-01-13 ENCOUNTER — Inpatient Hospital Stay: Payer: Medicare HMO

## 2023-01-13 MED ORDER — PROCHLORPERAZINE MALEATE 10 MG PO TABS: 10.0000 mg | ORAL_TABLET | Freq: Four times a day (QID) | ORAL | 3 refills | Status: DC | PRN

## 2023-01-13 MED ORDER — LIDOCAINE-PRILOCAINE 2.5-2.5 % EX CREA: TOPICAL_CREAM | CUTANEOUS | 0 refills | Status: DC

## 2023-01-13 NOTE — Telephone Encounter (Signed)
Called to reschedule port. She didn't show this morning. No answer, left vm. AB

## 2023-01-13 NOTE — Progress Notes (Signed)

## 2023-01-13 NOTE — H&P (Incomplete)
Chief Complaint: Patient was seen in consultation today for rectal cancer; port-a-catheter placement   Referring Physician(s): Katragadda,Sreedhar  Supervising Physician: Daryll Brod  Patient Status: [facility] - Out-pt  History of Present Illness: [name] is a 67 y.o. female with a medical history significant for HTN, CAD, DM with peripheral neuropathy, anxiety and iron-deficiency anemia. She has had rectal bleeding for the past 10 years and increased drastically in the past 4-5 years. On 12/16/22 she underwent a colonoscopy for further evaluation of a rectal tumor protruding from the anal orifice. 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 Additional imaging revealed locally advanced low rectal primary (hypermetabolic) without bowel obstruction or nodal metastases. Her oncology team is preparing her for chemotherapy and radiation therapy followed by surgery.   Interventional Radiology has been asked to evaluate this patient for an image-guided port-a-catheter placement to facilitate her treatment plans.   Past Medical History:  Diagnosis Date   Anxiety    Coronary atherosclerosis    Cardiac CT 09/2018 with calcium score 8.7 and mild proximal LAD disease   Essential hypertension 2009   GERD (gastroesophageal reflux disease)    Hypothyroidism    Iron deficiency anemia    Osteoarthritis    Palpitations    Tachypalpitations on beta blockers since 2010   Type 2 diabetes mellitus ([place]) 2003   Vitamin B 12 deficiency    Vitamin D deficiency     Past Surgical History:  Procedure Laterality Date   ABDOMINAL HYSTERECTOMY     History of cervical cancer   BIOPSY  12/16/2022   Procedure: BIOPSY;  Surgeon: Daneil Dolin, MD;  [facility];  Service: Endoscopy;;  gastric, rectal   BREAST BIOPSY Right    COLONOSCOPY WITH PROPOFOL N/A 12/16/2022   Procedure: COLONOSCOPY WITH PROPOFOL;  Surgeon: Daneil Dolin, MD;  [facility];   Service: Endoscopy;  Laterality: N/A;  10:45 am   ESOPHAGOGASTRODUODENOSCOPY (EGD) WITH PROPOFOL N/A 12/16/2022   Procedure: ESOPHAGOGASTRODUODENOSCOPY (EGD) WITH PROPOFOL;  Surgeon: Daneil Dolin, MD;  [facility];  Service: Endoscopy;  Laterality: N/A;   MALONEY DILATION N/A 12/16/2022   Procedure: Venia Minks DILATION;  Surgeon: Daneil Dolin, MD;  [facility]: [facility];  Service: Endoscopy;  Laterality: N/A;   POLYPECTOMY  12/16/2022   Procedure: POLYPECTOMY;  Surgeon: Daneil Dolin, MD;  [facility]: [facility];  Service: Endoscopy;;   TONSILLECTOMY      Allergies: Contrast media [iodinated contrast media], Sulfa antibiotics, and Doxepin  Medications: Prior to Admission medications   Medication Sig Start Date End Date Taking? Authorizing Provider  blood glucose meter kit and supplies KIT Dispense based on patient and insurance preference. Use up to four times daily as directed. (FOR ICD-10 E11.65) 08/20/17   Cassandria Anger, MD  Cholecalciferol (VITAMIN D3) 5000 units TABS Take 5,000 Units by mouth daily.    [provider]  cyanocobalamin (VITAMIN B12) 1000 MCG/ML injection B12 shots at a frequency of once weekly for 4 weeks then can drop down to once monthly thereafter. 10/22/22   [provider]  cyclobenzaprine (FLEXERIL) 10 MG tablet Take 10 mg by mouth 3 (three) times daily as needed for muscle spasms. 01/08/22   [provider]  DROPLET PEN NEEDLES 31G X 5 MM MISC USE AS INSTRUCTED TO INJECT INSULIN DAILY 05/18/22   Brita Romp, NP  ferrous sulfate 325 (65 FE) MG EC tablet Take 325 mg by mouth daily.  [provider]  FLUoxetine (PROZAC) 20 MG capsule Take 20 mg by mouth daily.    [provider]  insulin aspart (NOVOLOG FLEXPEN) 100 UNIT/ML FlexPen INJECT 8 TO 14 UNITS UNDER THE SKIN THREE TIMES DAILY WITH MEALS 11/10/22   Brita Romp, NP  insulin glargine (LANTUS SOLOSTAR) 100 UNIT/ML Solostar Pen  Inject 60 Units into the skin at bedtime. 08/11/22   Brita Romp, NP  levothyroxine (SYNTHROID) 88 MCG tablet TAKE 1 TABLET EVERY DAY BEFORE BREAKFAST 11/10/22   Brita Romp, NP  losartan (COZAAR) 100 MG tablet Take 100 mg by mouth daily. 09/21/14   [provider]  magnesium oxide (MAG-OX) 400 MG tablet Take 400 mg by mouth at bedtime.    [provider]  metFORMIN (GLUCOPHAGE) 1000 MG tablet Take 1 tablet (1,000 mg total) by mouth 2 (two) times daily. 09/01/22   Brita Romp, NP  pantoprazole (PROTONIX) 40 MG tablet Take 40 mg by mouth daily.      [provider]  pregabalin (LYRICA) 100 MG capsule Take 1 capsule (100 mg total) by mouth 2 (two) times daily. 12/24/22   Brita Romp, NP  tirzepatide [facility]) 10 MG/0.5ML Pen Inject 10 mg into the skin once a week. 08/11/22   Brita Romp, NP  TRUE METRIX BLOOD GLUCOSE TEST test strip TEST BLOOD SUGAR TWICE DAILY 11/10/22   Cassandria Anger, MD     Family History  Problem Relation Age of Onset   Heart failure Mother    Diabetes Father    Hypertension Father    Stroke Sister 30   Diabetes Brother    Cancer - Colon Other        age 16   Colon polyps Neg Hx     Social History   Socioeconomic History   Marital status: Single    Spouse name: Not on file   Number of children: 2   Years of education: Not on file   Highest education level: Not on file  Occupational History   Occupation: DISABLED    Employer: UNEMPLOYED    Comment: [employer]  Tobacco Use   Smoking status: Never   Smokeless tobacco: Never  Vaping Use   Vaping Use: Never used  Substance and Sexual Activity   Alcohol use: No   Drug use: No   Sexual activity: Never  Other Topics Concern   Not on file  Social History Narrative   Has 2 grandchildren. 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  All other systems are negative.  Review of Systems  Vital Signs: There were no vitals taken for this visit.  Physical Exam  Imaging: NM PET Image Initial (PI) Skull Base To Thigh  Result Date: 01/01/2023 CLINICAL DATA:  Initial treatment strategy  for rectal cancer. EXAM: NUCLEAR MEDICINE PET SKULL BASE TO THIGH TECHNIQUE: 14.6 mCi F-18 FDG was injected intravenously. Full-ring PET imaging was performed from the skull base to thigh after the radiotracer. CT data was obtained and used for attenuation correction and anatomic localization. Fasting blood glucose: 198 mg/dl COMPARISON:  Chest abdomen pelvis CT 12/17/2022 FINDINGS: Mediastinal blood pool activity: SUV max 3.2 Liver activity: SUV max NA NECK: No hypermetabolic lymph nodes in the neck. Incidental CT findings: None. CHEST: No hypermetabolic mediastinal or hilar nodes. No suspicious pulmonary nodules on the CT scan. 2.3 cm inferior right thyroid nodule noted image 68/3 without hypermetabolism. The 3 mm right middle lobe subpleural nodule identified on the previous CT scan is not evident on CT or PET imaging today. Incidental CT findings: Asymmetric elevation right hemidiaphragm. Atelectasis or scarring noted right base. ABDOMEN/PELVIS: No abnormal hypermetabolic activity within the liver, pancreas, adrenal glands, or spleen. No hypermetabolic lymph nodes in the abdomen or pelvis. Patient's known rectal lesion is markedly  hypermetabolic with SUV max = 40. No discernible hypermetabolic perirectal or pelvic lymphadenopathy. The subtle too small to characterize hypoattenuating lesion seen in the hepatic dome on previous CT show no detectable hypermetabolism on PET imaging. There is hypermetabolism in the region of the gallbladder neck with SUV max = 4.9. This corresponds to the subtle 13 mm focus of increased attenuation in the gallbladder neck. While this could be a noncalcified stone, given the FDG accumulation in this region, right upper quadrant ultrasound recommended to further evaluate as gallbladder polyp/neoplasm could have this appearance. Incidental CT findings: As noted previously, liver morphology raises the question of cirrhosis. Diffusely decreased attenuation of liver parenchyma is compatible with fatty deposition. 2.7 cm exophytic posterior right interpolar lesion shows no substantial hypermetabolism with SUV max = 2.8. diverticular changes noted left colon without diverticulitis. SKELETON: No focal hypermetabolic activity to suggest skeletal metastasis. Incidental CT findings: No worrisome lytic or sclerotic osseous abnormality. Skin thickening in superficial subcutaneous edema noted lower anterior abdominal wall, similar to prior. Cellulitis/panniculitis not excluded. IMPRESSION: 1. The patient's known rectal lesion is markedly hypermetabolic. No evidence for hypermetabolic perirectal or pelvic lymphadenopathy. 2. No other evidence for hypermetabolic metastatic disease in the neck, chest, abdomen, or pelvis. 3. The subtle too small to characterize hypoattenuating lesions seen in the hepatic dome on previous CT show no detectable hypermetabolism on PET imaging. 4. Focal hypermetabolism in the region of the gallbladder neck. This corresponds to a subtle 13 mm focus of increased attenuation in the lumen of the gallbladder. While on CT imaging this could be a noncalcified stone, given the FDG accumulation in this region,  gallbladder polyp/neoplasm is a concern. Right upper quadrant ultrasound recommended to further evaluate. 5. 2.7 cm exophytic posterior right interpolar renal lesion shows no substantial hypermetabolism on PET imaging. Likely a cyst complicated by proteinaceous debris or hemorrhage, close attention on follow-up recommended. 6. 2.3 cm inferior right thyroid nodule without hypermetabolism on PET imaging. Recommend thyroid US (ref: J Am Coll Radiol. 2015 Feb;12(2): 143-50). 7. Similar skin thickening with superficial edema associated with the lower anterior abdominal wall. Electronically Signed   By: Misty Stanley M.D.   On: 01/01/2023 09:51   MR PELVIS WO CM RECTAL CA STAGING  Result Date: 12/24/2022 CLINICAL DATA:  Rectal cancer.  Staging. EXAM: MRI PELVIS WITHOUT CONTRAST TECHNIQUE: Multiplanar multisequence MR imaging of the pelvis was performed. No intravenous contrast was administered. Ultrasound gel was administered per rectum to optimize tumor evaluation. COMPARISON:  CT  of 12/17/2022 FINDINGS: TUMOR LOCATION Tumor distance from Anal Verge/Skin surface: 8.9 cm on 19/2 Tumor distance to Internal Anal sphincter: 2.8 cm on 31/8 TUMOR DESCRIPTION Circumferential extent: Centered about the 3 o'clock position, on the order of 240 degrees. Tumor Size and volume: 3.8 x 3.0 x 5.8 cm (volume = 35 cm^3) on images 39/7 and 30/8. T - CATEGORY Extension through Muscularis Propria: At the 2 o'clock position measuring 2.4 x 2.0 cm on 39/7. T3d Shortest Distance of any tumor/node from Mesorectal fascia: Not applicable.-tumor contacts and involves the mesorectal fascia on 39/7 Extramural Vascular Invasion/Tumor Thrombus: No Invasion of Anterior Peritoneal Reflection: No Involvement of Adjacent Organs or Pelvic Sidewall: Indeterminate. Suspicion of involvement of pelvic floor musculature and the far posterior and left side of the vagina on 25/5 and 41/7. Levator Ani Involvement: No N - CATEGORY Mesorectal Lymph Nodes >=105m:  None=N0 Extra-mesorectal Lymphadenopathy: No Other: No significant free fluid. Scattered colonic diverticula. Hysterectomy. Normal urinary bladder. ***blank staging*** IMPRESSION: Rectal adenocarcinoma T stage: T3d. Probable T4. Rectal adenocarcinoma N stage:  N0 Distance from tumor to the internal anal sphincter is 2.8 cm. Electronically Signed   By: KAbigail MiyamotoM.D.   On: 12/24/2022 14:27   CT CHEST ABDOMEN PELVIS W CONTRAST  Result Date: 12/18/2022 CLINICAL DATA:  Positive Cologuard test. Tumor protruding through anal orifice. Rectal bleeding. * Tracking Code: BO * EXAM: CT CHEST, ABDOMEN, AND PELVIS WITH CONTRAST TECHNIQUE: Multidetector CT imaging of the chest, abdomen and pelvis was performed following the standard protocol during bolus administration of intravenous contrast. RADIATION DOSE REDUCTION: This exam was performed according to the departmental dose-optimization program which includes automated exposure control, adjustment of the mA and/or kV according to patient size and/or use of iterative reconstruction technique. CONTRAST:  1084mOMNIPAQUE IOHEXOL 300 MG/ML  SOLN COMPARISON:  06/27/2021 chest radiograph.  No prior CTs. FINDINGS: CT CHEST FINDINGS Cardiovascular: Aortic atherosclerosis. Mild cardiomegaly, without pericardial effusion. No central pulmonary embolism, on this non-dedicated study. Mediastinum/Nodes: 1.8 cm right thyroid lobe/isthmic nodule on 05/02. No supraclavicular adenopathy.  No mediastinal or hilar adenopathy. Lungs/Pleura: No pleural fluid. Moderate right hemidiaphragm elevation. 3 mm subpleural right middle lobe pulmonary nodule on 61/4. Musculoskeletal: No acute osseous abnormality. CT ABDOMEN PELVIS FINDINGS Hepatobiliary: Moderate cirrhosis, as evidenced by marked caudate lobe enlargement and subtle irregular hepatic capsule. Concurrent probable hepatic steatosis. Suspect at least 1 too small to characterize hepatic dome lesion including on images 34 and possibly 33 of  series 2. 1.9 cm gallstone without acute cholecystitis or biliary duct dilatation. Pancreas: Normal, without mass or ductal dilatation. Spleen: Splenomegaly, 15.5 cm craniocaudal. Adrenals/Urinary Tract: Normal adrenal glands. A complex posterior interpolar right renal 2.6 cm lesion on 67/2. Interpolar left renal 1.3 cm cyst. Other bilateral renal lesions which are too small to characterize. No hydronephrosis. Normal urinary bladder. Stomach/Bowel: Proximal gastric underdistention. Eccentric left rectal primary is identified on 114/2 with transmural extension including on 116/2. No bowel obstruction. Extensive colonic diverticulosis. Normal terminal ileum. Normal small bowel. Vascular/Lymphatic: Aortic atherosclerosis. Patent renal veins. Portal venous hypertension, with recannulized paraumbilical vein on [id number] portal and splenic veins. No abdominopelvic adenopathy. Reproductive: Hysterectomy.  No adnexal mass. Other: No significant free fluid. No evidence of omental or peritoneal disease. Edema within the anterior pelvis on 116/2. Musculoskeletal: No acute osseous abnormality. IMPRESSION: 1. Locally advanced low rectal primary without bowel obstruction or nodal metastasis. 2. Isolated right middle lobe subpleural pulmonary nodule is most likely benign and indicative of a subpleural lymph node. Recommend  attention on follow-up. 3. Cirrhosis and portal venous hypertension. Subtle tiny low-density foci within the liver most likely degenerative nodules. No convincing evidence of hepatic metastasis. 4. Complex 2.6 cm right renal lesion could represent a hemorrhagic/proteinaceous cyst or solid neoplasm. This could either be re-evaluated with dedicated pre and post contrast abdominal imaging on follow-up CTs or more entirely characterized with dedicated pre and post contrast abdominal MRI. 5. Right-sided thyroid/isthmic nodule of 1.8 cm. Recommend thyroid US (ref: J Am Coll Radiol. 2015 Feb;12(2): 143-50). 6.   Aortic Atherosclerosis (ICD10-I70.0). 7. Cholelithiasis 8. Anterior pannus edema could represent panniculitis. Consider physical exam correlation. Electronically Signed   By: Abigail Miyamoto M.D.   On: 12/18/2022 10:06    Labs:  CBC: Recent Labs    11/26/22 1343 12/14/22 1122 12/16/22 1052  WBC 7.2 7.9 7.0  HGB 8.6* 8.5* 8.7*  HCT 30.5* 31.4* 31.4*  PLT 199 209 207    COAGS: No results for input(s): "INR", "APTT" in the last 8760 hours.  BMP: Recent Labs    09/03/22 1305 12/14/22 1122 12/16/22 1052  NA 139 134* 135  K 4.7 4.2 3.7  CL 101 101 101  CO2 25 23 26  $ GLUCOSE 183* 222* 169*  BUN 8 10 7*  CALCIUM 9.4 8.8* 8.3*  CREATININE 0.86 0.87 0.87  GFRNONAA  --  >60 >60    LIVER FUNCTION TESTS: Recent Labs    09/03/22 1305 12/16/22 1052  BILITOT 0.3 0.7  AST 49* 41  ALT 18 16  ALKPHOS 62 42  PROT 6.6 6.4*  ALBUMIN 3.7* 3.2*    TUMOR MARKERS: No results for input(s): "AFPTM", "CEA", "CA199", "CHROMGRNA" in the last 8760 hours.  Assessment and Plan:  Rectal cancer; pending chemotherapy - durable venous access required: Royetta Crochet. [name], 67 year old female, presents today to the [facility] department for an image-guided port-a-catheter placement.  Risks and benefits of image-guided Port-a-catheter placement were discussed with the patient including, but not limited to bleeding, infection, pneumothorax, or fibrin sheath development and need for additional procedures.  All of the patient's questions were answered, patient is agreeable to proceed. She has been NPO. She is a full code.   Consent signed and in chart.   Thank you for this interesting consult.  I greatly enjoyed meeting Caricia E Bougher and look forward to participating in their care.  A copy of this report was sent to the requesting provider on this date.  Electronically Signed: Soyla Dryer, AGACNP-BC [phone] 01/13/2023, 7:32 AM   I spent a total of  30 Minutes   in  face to face in clinical consultation, greater than 50% of which was counseling/coordinating care for port-a-catheter placement

## 2023-01-14 ENCOUNTER — Other Ambulatory Visit: Payer: Self-pay | Admitting: Hematology

## 2023-01-14 ENCOUNTER — Inpatient Hospital Stay: Payer: Medicare HMO | Admitting: Licensed Clinical Social Worker

## 2023-01-14 DIAGNOSIS — C20 Malignant neoplasm of rectum: Secondary | ICD-10-CM

## 2023-01-14 NOTE — Progress Notes (Signed)
START ON PATHWAY REGIMEN - Colorectal     A cycle is every 14 days:     Oxaliplatin      Leucovorin      Fluorouracil      Fluorouracil   **Always confirm dose/schedule in your pharmacy ordering system**  Patient Characteristics: Preoperative or Nonsurgical Candidate, M0 (Clinical Staging), Rectal, cT0/1, cN+; or cT4, Any cN; or Any cT, cN2 Tumor Location: Rectal Therapeutic Status: Preoperative or Nonsurgical Candidate, M0 (Clinical Staging) AJCC T Category: cT4b AJCC N Category: cN0 AJCC M Category: cM0 AJCC 8 Stage Grouping: IIC Intent of Therapy: Curative Intent, Discussed with Patient

## 2023-01-14 NOTE — Progress Notes (Signed)
[place] Social Work  Patient presented to [facility]  to review and complete healthcare advance directives.  Clinical Social Worker met with patient.  The patient designated her daughter April Winchester [phone]) as their primary healthcare agent and Royston Bake [phone]) as their secondary agent.  Patient also completed healthcare living will.    Documents were notarized and copies made for patient/family. Clinical Social Worker will send documents to medical records to be scanned into patient's chart. Clinical Social Worker encouraged patient/family to contact with any additional questions or concerns.   Henriette Combs, [place] Worker [facility]

## 2023-01-15 ENCOUNTER — Ambulatory Visit (HOSPITAL_COMMUNITY)
Admission: RE | Admit: 2023-01-15 | Discharge: 2023-01-15 | Disposition: A | Discharge status: Home / Self Care | Payer: Medicare HMO | Source: Ambulatory Visit | Attending: Hematology | Admitting: Hematology

## 2023-01-15 ENCOUNTER — Other Ambulatory Visit: Payer: Self-pay

## 2023-01-15 DIAGNOSIS — I10 Essential (primary) hypertension: Secondary | ICD-10-CM | POA: Insufficient documentation

## 2023-01-15 DIAGNOSIS — C20 Malignant neoplasm of rectum: Secondary | ICD-10-CM | POA: Diagnosis present

## 2023-01-15 DIAGNOSIS — Z794 Long term (current) use of insulin: Secondary | ICD-10-CM | POA: Diagnosis not present

## 2023-01-15 DIAGNOSIS — Z7985 Long-term (current) use of injectable non-insulin antidiabetic drugs: Secondary | ICD-10-CM | POA: Insufficient documentation

## 2023-01-15 DIAGNOSIS — Z7984 Long term (current) use of oral hypoglycemic drugs: Secondary | ICD-10-CM | POA: Insufficient documentation

## 2023-01-15 DIAGNOSIS — E119 Type 2 diabetes mellitus without complications: Secondary | ICD-10-CM | POA: Insufficient documentation

## 2023-01-15 HISTORY — PX: IR IMAGING GUIDED PORT INSERTION: IMG5740

## 2023-01-15 LAB — GLUCOSE, CAPILLARY
Glucose-Capillary: 132 mg/dL — ABNORMAL HIGH (ref 70–99)
Glucose-Capillary: 113 mg/dL — ABNORMAL HIGH (ref 70–99)

## 2023-01-15 MED ORDER — LIDOCAINE-EPINEPHRINE 1 %-1:100000 IJ SOLN
INTRAMUSCULAR | Status: AC
  Administered 2023-01-15: 10 mL
  Filled 2023-01-15: qty 1

## 2023-01-15 MED ORDER — FENTANYL CITRATE (PF) 100 MCG/2ML IJ SOLN
INTRAMUSCULAR | Status: AC | PRN
  Administered 2023-01-15: 50 ug via INTRAVENOUS
  Administered 2023-01-15: 25 ug via INTRAVENOUS

## 2023-01-15 MED ORDER — HEPARIN SOD (PORK) LOCK FLUSH 100 UNIT/ML IV SOLN
INTRAVENOUS | Status: AC | PRN
  Administered 2023-01-15: 500 [IU] via INTRAVENOUS

## 2023-01-15 MED ORDER — MIDAZOLAM HCL 2 MG/2ML IJ SOLN
INTRAMUSCULAR | Status: AC | PRN
  Administered 2023-01-15: .5 mg via INTRAVENOUS
  Administered 2023-01-15: 1 mg via INTRAVENOUS

## 2023-01-15 MED ORDER — SODIUM CHLORIDE 0.9 % IV SOLN: INTRAVENOUS | Status: DC

## 2023-01-15 MED ORDER — FENTANYL CITRATE (PF) 100 MCG/2ML IJ SOLN
INTRAMUSCULAR | Status: AC
  Filled 2023-01-15: qty 2

## 2023-01-15 MED ORDER — MIDAZOLAM HCL 2 MG/2ML IJ SOLN
INTRAMUSCULAR | Status: AC
  Filled 2023-01-15: qty 2

## 2023-01-15 MED ORDER — HEPARIN SOD (PORK) LOCK FLUSH 100 UNIT/ML IV SOLN
INTRAVENOUS | Status: AC
  Filled 2023-01-15: qty 5

## 2023-01-15 NOTE — H&P (Signed)
Chief Complaint: Patient was seen in consultation today for image-guided port placement  Referring Physician(s): Katragadda,Sreedhar  Supervising Physician: Juliet Rude  Patient Status: [facility] - Out-pt  History of Present Illness: [name] is a 67 y.o. female with PMH significant for hypertension, type 2 diabetes mellitus, and newly diagnosed Stage IIc rectal cancer. The patient presents today on referral from Dr Delton Coombes from Oncology for image-guided port placement. The patient will be starting chemotherapy and requires a port to facilitate IV access.   Past Medical History:  Diagnosis Date   Anxiety    Coronary atherosclerosis    Cardiac CT 09/2018 with calcium score 8.7 and mild proximal LAD disease   Essential hypertension 2009   GERD (gastroesophageal reflux disease)    Hypothyroidism    Iron deficiency anemia    Osteoarthritis    Palpitations    Tachypalpitations on beta blockers since 2010   Type 2 diabetes mellitus ([place]) 2003   Vitamin B 12 deficiency    Vitamin D deficiency     Past Surgical History:  Procedure Laterality Date   ABDOMINAL HYSTERECTOMY     History of cervical cancer   BIOPSY  12/16/2022   Procedure: BIOPSY;  Surgeon: Daneil Dolin, MD;  [facility];  Service: Endoscopy;;  gastric, rectal   BREAST BIOPSY Right    COLONOSCOPY WITH PROPOFOL N/A 12/16/2022   Procedure: COLONOSCOPY WITH PROPOFOL;  Surgeon: Daneil Dolin, MD;  Location: [facility];  Service: Endoscopy;  Laterality: N/A;  10:45 am   ESOPHAGOGASTRODUODENOSCOPY (EGD) WITH PROPOFOL N/A 12/16/2022   Procedure: ESOPHAGOGASTRODUODENOSCOPY (EGD) WITH PROPOFOL;  Surgeon: Daneil Dolin, MD;  [facility];  Service: Endoscopy;  Laterality: N/A;   MALONEY DILATION N/A 12/16/2022   Procedure: Venia Minks DILATION;  Surgeon: Daneil Dolin, MD;  [facility];  Service: Endoscopy;  Laterality: N/A;   POLYPECTOMY  12/16/2022   Procedure: POLYPECTOMY;   Surgeon: Daneil Dolin, MD;  [facility];  Service: Endoscopy;;   TONSILLECTOMY      Allergies: Contrast media [iodinated contrast media], Sulfa antibiotics, and Doxepin  Medications: Prior to Admission medications   Medication Sig Start Date End Date Taking? Authorizing Provider  blood glucose meter kit and supplies KIT Dispense based on patient and insurance preference. Use up to four times daily as directed. (FOR ICD-10 E11.65) 08/20/17   Cassandria Anger, MD  Cholecalciferol (VITAMIN D3) 5000 units TABS Take 5,000 Units by mouth daily.    [provider]  cyanocobalamin (VITAMIN B12) 1000 MCG/ML injection B12 shots at a frequency of once weekly for 4 weeks then can drop down to once monthly thereafter. 10/22/22   [provider]  cyclobenzaprine (FLEXERIL) 10 MG tablet Take 10 mg by mouth 3 (three) times daily as needed for muscle spasms. 01/08/22   [provider]  dextrose 5 % SOLN 1,000 mL with fluorouracil 5 GM/100ML SOLN Inject into the vein over 48 hr. Every 14 days 01/20/23   [provider]  DROPLET PEN NEEDLES 31G X 5 MM MISC USE AS INSTRUCTED TO INJECT INSULIN DAILY 05/18/22   Brita Romp, NP  ferrous sulfate 325 (65 FE) MG EC tablet Take 325 mg by mouth daily.    [provider]  FLUOROURACIL IV Inject into the vein every 14 (fourteen) days. 01/20/23   [provider]  FLUoxetine (PROZAC) 20 MG capsule Take 20 mg by mouth daily.    [provider]  insulin aspart (NOVOLOG FLEXPEN) 100 UNIT/ML FlexPen INJECT 8 TO 14 UNITS UNDER THE SKIN THREE TIMES DAILY WITH MEALS 11/10/22   Brita Romp, NP  insulin glargine (LANTUS SOLOSTAR) 100 UNIT/ML Solostar Pen Inject 60 Units into the skin at bedtime. 08/11/22   Brita Romp, NP  IRINOTECAN HCL IV Inject into the vein every 14 (fourteen) days. 01/20/23   [provider]  LEUCOVORIN CALCIUM IV Inject into the vein every 14 (fourteen)  days. 01/20/23   [provider]  levothyroxine (SYNTHROID) 88 MCG tablet TAKE 1 TABLET EVERY DAY BEFORE BREAKFAST 11/10/22   Brita Romp, NP  lidocaine-prilocaine (EMLA) cream Apply a quarter sized amount to port a cath site and cover with plastic wrap one hour prior to infusion appointments 01/13/23   Derek Jack, MD  losartan (COZAAR) 100 MG tablet Take 100 mg by mouth daily. 09/21/14   [provider]  magnesium oxide (MAG-OX) 400 MG tablet Take 400 mg by mouth at bedtime.    [provider]  metFORMIN (GLUCOPHAGE) 1000 MG tablet Take 1 tablet (1,000 mg total) by mouth 2 (two) times daily. 09/01/22   Brita Romp, NP  OXALIPLATIN IV Inject into the vein every 14 (fourteen) days. 01/20/23   [provider]  pantoprazole (PROTONIX) 40 MG tablet Take 40 mg by mouth daily.      [provider]  pregabalin (LYRICA) 100 MG capsule Take 1 capsule (100 mg total) by mouth 2 (two) times daily. 12/24/22   Brita Romp, NP  prochlorperazine (COMPAZINE) 10 MG tablet Take 1 tablet (10 mg total) by mouth every 6 (six) hours as needed for nausea or vomiting. 01/13/23   Derek Jack, MD  tirzepatide [facility]) 10 MG/0.5ML Pen Inject 10 mg into the skin once a week. 08/11/22   Brita Romp, NP  TRUE METRIX BLOOD GLUCOSE TEST test strip TEST BLOOD SUGAR TWICE DAILY 11/10/22   Cassandria Anger, MD     Family History  Problem Relation Age of Onset   Heart failure Mother    Diabetes Father    Hypertension Father    Stroke Sister 76   Diabetes Brother    Cancer - Colon Other        age 73   Colon polyps Neg Hx     Social History   Socioeconomic History   Marital status: Single    Spouse name: Not on file   Number of children: 2   Years of education: Not on file   Highest education level: Not on file  Occupational History   Occupation: DISABLED    Employer: UNEMPLOYED    Comment: [employer]  Tobacco Use    Smoking status: Never   Smokeless tobacco: Never  Vaping Use   Vaping Use: Never used  Substance and Sexual Activity   Alcohol use: No   Drug use: No   Sexual activity: Never  Other Topics Concern   Not on file  Social History Narrative   Has 2 grandchildren. 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   Social Determinants of Health   Financial Resource Strain: Not on file  Food Insecurity: No Food Insecurity (01/06/2023)   Hunger Vital Sign    Worried About Running Out of Food in the Last Year: Never true    Ran Out of Food in the Last Year: Never true  Transportation Needs: No Transportation Needs (01/06/2023)   PRAPARE - Transportation    Lack of Transportation (  Medical): No    Lack of Transportation (Non-Medical): No  Physical Activity: Not on file  Stress: Not on file  Social Connections: Not on file    Code Status: Full code  Review of Systems: A 12 point ROS discussed and pertinent positives are indicated in the HPI above.  All other systems are negative.  Review of Systems  Constitutional:  Negative for chills and fever.  Respiratory:  Negative for chest tightness and shortness of breath.   Cardiovascular:  Positive for leg swelling. Negative for chest pain.  Gastrointestinal:  Positive for abdominal pain. Negative for diarrhea, nausea and vomiting.  Neurological:  Negative for dizziness and headaches.  Psychiatric/Behavioral:  Negative for confusion.     Vital Signs: BP 119/64   Pulse (!) 106   Temp (!) 97.2 F (36.2 C) (Temporal)   Resp 17   Ht '5\' 7"'$  (1.702 m)   Wt 295 lb (133.8 kg)   SpO2 98%   BMI 46.20 kg/m    Physical Exam Vitals reviewed.  Cardiovascular:     Rate and Rhythm: Regular rhythm. Tachycardia present.     Pulses: Normal pulses.     Heart sounds: Normal heart sounds.  Pulmonary:     Effort: Pulmonary effort is normal.     Breath sounds: Normal breath sounds.  Abdominal:     General: Bowel sounds are normal.      Palpations: Abdomen is soft.     Tenderness: There is abdominal tenderness.  Musculoskeletal:     Right lower leg: Edema present.     Left lower leg: Edema present.  Skin:    General: Skin is warm and dry.  Neurological:     Mental Status: She is alert and oriented to person, place, and time.  Psychiatric:        Mood and Affect: Mood normal.        Behavior: Behavior normal.        Thought Content: Thought content normal.        Judgment: Judgment normal.     Imaging: NM PET Image Initial (PI) Skull Base To Thigh  Result Date: 01/01/2023 CLINICAL DATA:  Initial treatment strategy for rectal cancer. EXAM: NUCLEAR MEDICINE PET SKULL BASE TO THIGH TECHNIQUE: 14.6 mCi F-18 FDG was injected intravenously. Full-ring PET imaging was performed from the skull base to thigh after the radiotracer. CT data was obtained and used for attenuation correction and anatomic localization. Fasting blood glucose: 198 mg/dl COMPARISON:  Chest abdomen pelvis CT 12/17/2022 FINDINGS: Mediastinal blood pool activity: SUV max 3.2 Liver activity: SUV max NA NECK: No hypermetabolic lymph nodes in the neck. Incidental CT findings: None. CHEST: No hypermetabolic mediastinal or hilar nodes. No suspicious pulmonary nodules on the CT scan. 2.3 cm inferior right thyroid nodule noted image 68/3 without hypermetabolism. The 3 mm right middle lobe subpleural nodule identified on the previous CT scan is not evident on CT or PET imaging today. Incidental CT findings: Asymmetric elevation right hemidiaphragm. Atelectasis or scarring noted right base. ABDOMEN/PELVIS: No abnormal hypermetabolic activity within the liver, pancreas, adrenal glands, or spleen. No hypermetabolic lymph nodes in the abdomen or pelvis. Patient's known rectal lesion is markedly hypermetabolic with SUV max = 40. No discernible hypermetabolic perirectal or pelvic lymphadenopathy. The subtle too small to characterize hypoattenuating lesion seen in the hepatic dome on  previous CT show no detectable hypermetabolism on PET imaging. There is hypermetabolism in the region of the gallbladder neck with SUV max = 4.9. This corresponds to  the subtle 13 mm focus of increased attenuation in the gallbladder neck. While this could be a noncalcified stone, given the FDG accumulation in this region, right upper quadrant ultrasound recommended to further evaluate as gallbladder polyp/neoplasm could have this appearance. Incidental CT findings: As noted previously, liver morphology raises the question of cirrhosis. Diffusely decreased attenuation of liver parenchyma is compatible with fatty deposition. 2.7 cm exophytic posterior right interpolar lesion shows no substantial hypermetabolism with SUV max = 2.8. diverticular changes noted left colon without diverticulitis. SKELETON: No focal hypermetabolic activity to suggest skeletal metastasis. Incidental CT findings: No worrisome lytic or sclerotic osseous abnormality. Skin thickening in superficial subcutaneous edema noted lower anterior abdominal wall, similar to prior. Cellulitis/panniculitis not excluded. IMPRESSION: 1. The patient's known rectal lesion is markedly hypermetabolic. No evidence for hypermetabolic perirectal or pelvic lymphadenopathy. 2. No other evidence for hypermetabolic metastatic disease in the neck, chest, abdomen, or pelvis. 3. The subtle too small to characterize hypoattenuating lesions seen in the hepatic dome on previous CT show no detectable hypermetabolism on PET imaging. 4. Focal hypermetabolism in the region of the gallbladder neck. This corresponds to a subtle 13 mm focus of increased attenuation in the lumen of the gallbladder. While on CT imaging this could be a noncalcified stone, given the FDG accumulation in this region, gallbladder polyp/neoplasm is a concern. Right upper quadrant ultrasound recommended to further evaluate. 5. 2.7 cm exophytic posterior right interpolar renal lesion shows no substantial  hypermetabolism on PET imaging. Likely a cyst complicated by proteinaceous debris or hemorrhage, close attention on follow-up recommended. 6. 2.3 cm inferior right thyroid nodule without hypermetabolism on PET imaging. Recommend thyroid US (ref: J Am Coll Radiol. 2015 Feb;12(2): 143-50). 7. Similar skin thickening with superficial edema associated with the lower anterior abdominal wall. Electronically Signed   By: Misty Stanley M.D.   On: 01/01/2023 09:51   MR PELVIS WO CM RECTAL CA STAGING  Result Date: 12/24/2022 CLINICAL DATA:  Rectal cancer.  Staging. EXAM: MRI PELVIS WITHOUT CONTRAST TECHNIQUE: Multiplanar multisequence MR imaging of the pelvis was performed. No intravenous contrast was administered. Ultrasound gel was administered per rectum to optimize tumor evaluation. COMPARISON:  CT of 12/17/2022 FINDINGS: TUMOR LOCATION Tumor distance from Anal Verge/Skin surface: 8.9 cm on 19/2 Tumor distance to Internal Anal sphincter: 2.8 cm on 31/8 TUMOR DESCRIPTION Circumferential extent: Centered about the 3 o'clock position, on the order of 240 degrees. Tumor Size and volume: 3.8 x 3.0 x 5.8 cm (volume = 35 cm^3) on images 39/7 and 30/8. T - CATEGORY Extension through Muscularis Propria: At the 2 o'clock position measuring 2.4 x 2.0 cm on 39/7. T3d Shortest Distance of any tumor/node from Mesorectal fascia: Not applicable.-tumor contacts and involves the mesorectal fascia on 39/7 Extramural Vascular Invasion/Tumor Thrombus: No Invasion of Anterior Peritoneal Reflection: No Involvement of Adjacent Organs or Pelvic Sidewall: Indeterminate. Suspicion of involvement of pelvic floor musculature and the far posterior and left side of the vagina on 25/5 and 41/7. Levator Ani Involvement: No N - CATEGORY Mesorectal Lymph Nodes >=15m: None=N0 Extra-mesorectal Lymphadenopathy: No Other: No significant free fluid. Scattered colonic diverticula. Hysterectomy. Normal urinary bladder. blank staging IMPRESSION: Rectal  adenocarcinoma T stage: T3d. Probable T4. Rectal adenocarcinoma N stage:  N0 Distance from tumor to the internal anal sphincter is 2.8 cm. Electronically Signed   By: KAbigail MiyamotoM.D.   On: 12/24/2022 14:27   CT CHEST ABDOMEN PELVIS W CONTRAST  Result Date: 12/18/2022 CLINICAL DATA:  Positive Cologuard test. Tumor protruding  through anal orifice. Rectal bleeding. * Tracking Code: BO * EXAM: CT CHEST, ABDOMEN, AND PELVIS WITH CONTRAST TECHNIQUE: Multidetector CT imaging of the chest, abdomen and pelvis was performed following the standard protocol during bolus administration of intravenous contrast. RADIATION DOSE REDUCTION: This exam was performed according to the departmental dose-optimization program which includes automated exposure control, adjustment of the mA and/or kV according to patient size and/or use of iterative reconstruction technique. CONTRAST:  185m OMNIPAQUE IOHEXOL 300 MG/ML  SOLN COMPARISON:  06/27/2021 chest radiograph.  No prior CTs. FINDINGS: CT CHEST FINDINGS Cardiovascular: Aortic atherosclerosis. Mild cardiomegaly, without pericardial effusion. No central pulmonary embolism, on this non-dedicated study. Mediastinum/Nodes: 1.8 cm right thyroid lobe/isthmic nodule on 05/02. No supraclavicular adenopathy.  No mediastinal or hilar adenopathy. Lungs/Pleura: No pleural fluid. Moderate right hemidiaphragm elevation. 3 mm subpleural right middle lobe pulmonary nodule on 61/4. Musculoskeletal: No acute osseous abnormality. CT ABDOMEN PELVIS FINDINGS Hepatobiliary: Moderate cirrhosis, as evidenced by marked caudate lobe enlargement and subtle irregular hepatic capsule. Concurrent probable hepatic steatosis. Suspect at least 1 too small to characterize hepatic dome lesion including on images 34 and possibly 33 of series 2. 1.9 cm gallstone without acute cholecystitis or biliary duct dilatation. Pancreas: Normal, without mass or ductal dilatation. Spleen: Splenomegaly, 15.5 cm craniocaudal.  Adrenals/Urinary Tract: Normal adrenal glands. A complex posterior interpolar right renal 2.6 cm lesion on 67/2. Interpolar left renal 1.3 cm cyst. Other bilateral renal lesions which are too small to characterize. No hydronephrosis. Normal urinary bladder. Stomach/Bowel: Proximal gastric underdistention. Eccentric left rectal primary is identified on 114/2 with transmural extension including on 116/2. No bowel obstruction. Extensive colonic diverticulosis. Normal terminal ileum. Normal small bowel. Vascular/Lymphatic: Aortic atherosclerosis. Patent renal veins. Portal venous hypertension, with recannulized paraumbilical vein on [id number] Patent portal and splenic veins. No abdominopelvic adenopathy. Reproductive: Hysterectomy.  No adnexal mass. Other: No significant free fluid. No evidence of omental or peritoneal disease. Edema within the anterior pelvis on 116/2. Musculoskeletal: No acute osseous abnormality. IMPRESSION: 1. Locally advanced low rectal primary without bowel obstruction or nodal metastasis. 2. Isolated right middle lobe subpleural pulmonary nodule is most likely benign and indicative of a subpleural lymph node. Recommend attention on follow-up. 3. Cirrhosis and portal venous hypertension. Subtle tiny low-density foci within the liver most likely degenerative nodules. No convincing evidence of hepatic metastasis. 4. Complex 2.6 cm right renal lesion could represent a hemorrhagic/proteinaceous cyst or solid neoplasm. This could either be re-evaluated with dedicated pre and post contrast abdominal imaging on follow-up CTs or more entirely characterized with dedicated pre and post contrast abdominal MRI. 5. Right-sided thyroid/isthmic nodule of 1.8 cm. Recommend thyroid [place](ref: J Am Coll Radiol. 2015 Feb;12(2): 143-50). 6.  Aortic Atherosclerosis (ICD10-I70.0). 7. Cholelithiasis 8. Anterior pannus edema could represent panniculitis. Consider physical exam correlation. Electronically Signed   By: KAbigail MiyamotoM.D.   On: 12/18/2022 10:06    Labs:  CBC: Recent Labs    11/26/22 1343 12/14/22 1122 12/16/22 1052  WBC 7.2 7.9 7.0  HGB 8.6* 8.5* 8.7*  HCT 30.5* 31.4* 31.4*  PLT 199 209 207    COAGS: No results for input(s): "INR", "APTT" in the last 8760 hours.  BMP: Recent Labs    09/03/22 1305 12/14/22 1122 12/16/22 1052  NA 139 134* 135  K 4.7 4.2 3.7  CL 101 101 101  CO2 '25 23 26  '$ GLUCOSE 183* 222* 169*  BUN 8 10 7*  CALCIUM 9.4 8.8* 8.3*  CREATININE 0.86 0.87 0.87  GFRNONAA  --  >  60 >60    LIVER FUNCTION TESTS: Recent Labs    09/03/22 1305 12/16/22 1052  BILITOT 0.3 0.7  AST 49* 41  ALT 18 16  ALKPHOS 62 42  PROT 6.6 6.4*  ALBUMIN 3.7* 3.2*    TUMOR MARKERS: No results for input(s): "AFPTM", "CEA", "CA199", "CHROMGRNA" in the last 8760 hours.  Assessment and Plan:  [name] is a 67 yo female being seen today for image-guided port placement. The patient was recently diagnosed with Stage IIc rectal cancer and requires IV chemotherapy. The patient was referred to IR by Dr Delton Coombes from Oncology. Case has been reviewed with Dr Denna Haggard and is set to proceed on 01/15/23.   Risks and benefits of image guided port-a-catheter placement was discussed with the patient including, but not limited to bleeding, infection, pneumothorax, or fibrin sheath development and need for additional procedures.  All of the patient's questions were answered, patient is agreeable to proceed. Consent signed and in chart.   Thank you for this interesting consult.  I greatly enjoyed meeting [name] and look forward to participating in their care.  A copy of this report was sent to the requesting provider on this date.  Electronically Signed: Lura Em, PA-C 01/15/2023, 8:57 AM   I spent a total of 40 Minutes   in face to face in clinical consultation, greater than 50% of which was counseling/coordinating care for image-guided port placement.

## 2023-01-15 NOTE — Procedures (Signed)
Interventional Radiology Procedure Note  Date of Procedure: 01/15/2023  Procedure: Port placement   Findings:  1. Chest port placement    Complications: No immediate complications noted.   Estimated Blood Loss: minimal  Follow-up and Recommendations: 1. Ready for use    Albin Felling, MD  Vascular & Interventional Radiology  01/15/2023 2:48 PM

## 2023-01-18 NOTE — Progress Notes (Signed)
Pharmacist Chemotherapy Monitoring - Initial Assessment    Anticipated start date: 01/20/23   The following has been reviewed per standard work regarding the patient's treatment regimen: The patient's diagnosis, treatment plan and drug doses, and organ/hematologic function Lab orders and baseline tests specific to treatment regimen  The treatment plan start date, drug sequencing, and pre-medications Prior authorization status  Patient's documented medication list, including drug-drug interaction screen and prescriptions for anti-emetics and supportive care specific to the treatment regimen The drug concentrations, fluid compatibility, administration routes, and timing of the medications to be used The patient's access for treatment and lifetime cumulative dose history, if applicable  The patient's medication allergies and previous infusion related reactions, if applicable   Changes made to treatment plan:  N/A  Follow up needed:  N/A   Wynona Neat, [facility], 01/18/2023  2:03 PM

## 2023-01-19 MED ORDER — PROCHLORPERAZINE MALEATE 10 MG PO TABS: 10.0000 mg | ORAL_TABLET | Freq: Four times a day (QID) | ORAL | 1 refills | Status: DC | PRN

## 2023-01-19 MED ORDER — LIDOCAINE-PRILOCAINE 2.5-2.5 % EX CREA: TOPICAL_CREAM | CUTANEOUS | 3 refills | Status: DC

## 2023-01-20 ENCOUNTER — Inpatient Hospital Stay: Payer: Medicare HMO | Admitting: Hematology

## 2023-01-20 ENCOUNTER — Inpatient Hospital Stay: Payer: Medicare HMO

## 2023-01-20 ENCOUNTER — Other Ambulatory Visit: Payer: Self-pay | Admitting: *Deleted

## 2023-01-20 ENCOUNTER — Ambulatory Visit: Payer: Medicare HMO

## 2023-01-20 VITALS — BP 104/74 | HR 96 | Temp 98.1°F | Resp 18 | Wt 291.2 lb

## 2023-01-20 VITALS — BP 124/69 | HR 101 | Temp 97.8°F | Resp 20

## 2023-01-20 DIAGNOSIS — Z95828 Presence of other vascular implants and grafts: Secondary | ICD-10-CM

## 2023-01-20 DIAGNOSIS — C20 Malignant neoplasm of rectum: Secondary | ICD-10-CM

## 2023-01-20 LAB — COMPREHENSIVE METABOLIC PANEL
ALT: 13 U/L (ref 0–44)
AST: 36 U/L (ref 15–41)
Albumin: 3.2 g/dL — ABNORMAL LOW (ref 3.5–5.0)
Alkaline Phosphatase: 49 U/L (ref 38–126)
Anion gap: 8 (ref 5–15)
BUN: 10 mg/dL (ref 8–23)
CO2: 23 mmol/L (ref 22–32)
Calcium: 8.4 mg/dL — ABNORMAL LOW (ref 8.9–10.3)
Chloride: 105 mmol/L (ref 98–111)
Creatinine, Ser: 0.81 mg/dL (ref 0.44–1.00)
GFR, Estimated: >60 mL/min (ref >60)
Glucose, Bld: 136 mg/dL — ABNORMAL HIGH (ref 70–99)
Potassium: 3.9 mmol/L (ref 3.5–5.1)
Sodium: 136 mmol/L (ref 135–145)
Total Bilirubin: 0.6 mg/dL (ref 0.3–1.2)
Total Protein: 6.5 g/dL (ref 6.5–8.1)

## 2023-01-20 LAB — CBC WITH DIFFERENTIAL/PLATELET
Abs Immature Granulocytes: 0.04 10*3/uL (ref 0.00–0.07)
Basophils Absolute: 0.1 10*3/uL (ref 0.0–0.1)
Basophils Relative: 1 %
Eosinophils Absolute: 0.2 10*3/uL (ref 0.0–0.5)
Eosinophils Relative: 2 %
HCT: 28 % — ABNORMAL LOW (ref 36.0–46.0)
Hemoglobin: 7.4 g/dL — ABNORMAL LOW (ref 12.0–15.0)
Immature Granulocytes: 1 %
Lymphocytes Relative: 21 %
Lymphs Abs: 1.3 10*3/uL (ref 0.7–4.0)
MCH: 20.7 pg — ABNORMAL LOW (ref 26.0–34.0)
MCHC: 26.4 g/dL — ABNORMAL LOW (ref 30.0–36.0)
MCV: 78.4 fL — ABNORMAL LOW (ref 80.0–100.0)
Monocytes Absolute: 0.6 10*3/uL (ref 0.1–1.0)
Monocytes Relative: 9 %
Neutro Abs: 4.2 10*3/uL (ref 1.7–7.7)
Neutrophils Relative %: 66 %
Platelets: 226 10*3/uL (ref 150–400)
RBC: 3.57 MIL/uL — ABNORMAL LOW (ref 3.87–5.11)
RDW: 20.5 % — ABNORMAL HIGH (ref 11.5–15.5)
WBC: 6.3 10*3/uL (ref 4.0–10.5)
nRBC: 0 % (ref 0.0–0.2)

## 2023-01-20 LAB — MAGNESIUM: Magnesium: 1.7 mg/dL (ref 1.7–2.4)

## 2023-01-20 MED ORDER — SODIUM CHLORIDE 0.9 % IV SOLN
150.0000 mg | Freq: Once | INTRAVENOUS | Status: AC
  Administered 2023-01-20: 150 mg via INTRAVENOUS
  Filled 2023-01-20: qty 150

## 2023-01-20 MED ORDER — SODIUM CHLORIDE 0.9 % IV SOLN: INTRAVENOUS | Status: DC

## 2023-01-20 MED ORDER — PALONOSETRON HCL INJECTION 0.25 MG/5ML
0.2500 mg | Freq: Once | INTRAVENOUS | Status: AC
  Administered 2023-01-20: 0.25 mg via INTRAVENOUS
  Filled 2023-01-20: qty 5

## 2023-01-20 MED ORDER — SODIUM CHLORIDE 0.9 % IV SOLN
2400.0000 mg/m2 | INTRAVENOUS | Status: DC
  Administered 2023-01-20: 6000 mg via INTRAVENOUS
  Filled 2023-01-20: qty 120

## 2023-01-20 MED ORDER — SODIUM CHLORIDE 0.9 % IV SOLN
150.0000 mg/m2 | Freq: Once | INTRAVENOUS | Status: AC
  Administered 2023-01-20: 400 mg via INTRAVENOUS
  Filled 2023-01-20: qty 20

## 2023-01-20 MED ORDER — SODIUM CHLORIDE 0.9% FLUSH
10.0000 mL | Freq: Once | INTRAVENOUS | Status: AC
  Administered 2023-01-20: 10 mL via INTRAVENOUS

## 2023-01-20 MED ORDER — PROCHLORPERAZINE EDISYLATE 10 MG/2ML IJ SOLN
10.0000 mg | Freq: Once | INTRAMUSCULAR | Status: AC
  Administered 2023-01-20: 10 mg via INTRAVENOUS
  Filled 2023-01-20: qty 2

## 2023-01-20 MED ORDER — ACETAMINOPHEN 325 MG PO TABS: 650.0000 mg | ORAL_TABLET | Freq: Once | ORAL | Status: DC

## 2023-01-20 MED ORDER — SODIUM CHLORIDE 0.9 % IV SOLN
10.0000 mg | Freq: Once | INTRAVENOUS | Status: AC
  Administered 2023-01-20: 10 mg via INTRAVENOUS
  Filled 2023-01-20: qty 10

## 2023-01-20 MED ORDER — ATROPINE SULFATE 1 MG/ML IV SOLN
0.5000 mg | Freq: Once | INTRAVENOUS | Status: AC | PRN
  Administered 2023-01-20: 0.5 mg via INTRAVENOUS
  Filled 2023-01-20: qty 1

## 2023-01-20 MED ORDER — FOLIC ACID 1 MG PO TABS: 1.0000 mg | ORAL_TABLET | Freq: Every day | ORAL | 6 refills | Status: DC

## 2023-01-20 MED ORDER — SODIUM CHLORIDE 0.9 % IV SOLN
398.5000 mg/m2 | Freq: Once | INTRAVENOUS | Status: AC
  Administered 2023-01-20: 1000 mg via INTRAVENOUS
  Filled 2023-01-20: qty 50

## 2023-01-20 MED ORDER — DEXTROSE 5 % IV SOLN: Freq: Once | INTRAVENOUS | Status: AC

## 2023-01-20 MED ORDER — OXALIPLATIN CHEMO INJECTION 100 MG/20ML
55.0000 mg/m2 | Freq: Once | INTRAVENOUS | Status: AC
  Administered 2023-01-20: 150 mg via INTRAVENOUS
  Filled 2023-01-20: qty 20

## 2023-01-20 NOTE — Patient Instructions (Signed)
[facility]  Discharge Instructions: Thank you for choosing [facility] to provide your oncology and hematology care.  If you have a lab appointment with the [facility], please come in thru the Main Entrance and check in at the main information desk.  Wear comfortable clothing and clothing appropriate for easy access to any Portacath or PICC line.   We strive to give you quality time with your provider. You may need to reschedule your appointment if you arrive late (15 or more minutes).  Arriving late affects you and other patients whose appointments are after yours.  Also, if you miss three or more appointments without notifying the office, you may be dismissed from the clinic at the provider's discretion.      For prescription refill requests, have your pharmacy contact our office and allow 72 hours for refills to be completed.    Today you received the following chemotherapy and/or immunotherapy agents Folfirinox   BELOW ARE SYMPTOMS THAT SHOULD BE REPORTED IMMEDIATELY: *FEVER GREATER THAN 100.4 F (38 C) OR HIGHER *CHILLS OR SWEATING *NAUSEA AND VOMITING THAT IS NOT CONTROLLED WITH YOUR NAUSEA MEDICATION *UNUSUAL SHORTNESS OF BREATH *UNUSUAL BRUISING OR BLEEDING *URINARY PROBLEMS (pain or burning when urinating, or frequent urination) *BOWEL PROBLEMS (unusual diarrhea, constipation, pain near the anus) TENDERNESS IN MOUTH AND THROAT WITH OR WITHOUT PRESENCE OF ULCERS (sore throat, sores in mouth, or a toothache) UNUSUAL RASH, SWELLING OR PAIN  UNUSUAL VAGINAL DISCHARGE OR ITCHING   Items with * indicate a potential emergency and should be followed up as soon as possible or go to the Emergency Department if any problems should occur.  Please show the CHEMOTHERAPY ALERT CARD or IMMUNOTHERAPY ALERT CARD at check-in to the Emergency Department and triage nurse.  Should you have questions after your visit or need to cancel or reschedule your  appointment, please contact [facility] [phone]  and follow the prompts.  Office hours are 8:00 a.m. to 4:30 p.m. Monday - Friday. Please note that voicemails left after 4:00 p.m. may not be returned until the following business day.  We are closed weekends and major holidays. You have access to a nurse at all times for urgent questions. Please call the main number to the clinic [phone] and follow the prompts.  For any non-urgent questions, you may also contact your provider using MyChart. We now offer e-Visits for anyone 41 and older to request care online for non-urgent symptoms. For details visit [email or web address].   Also download the MyChart app! Go to the app store, search "MyChart", open the app, select [facility], and log in with your MyChart username and password.

## 2023-01-20 NOTE — Progress Notes (Signed)
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 female seen for follow-up of low rectal cancer and initiation of cycle of chemotherapy.  Reports energy levels of 40%.  Numbness in the toes without feeling has been stable.  She is taking iron tablet and B12 tablet daily.  She received her first Venofer infusion on 01/06/2023 and felt better in terms of energy.  She has rectal pain when she uses bathroom.  Rectal pain is better if she eats soft foods, potatoes and rice.    REVIEW OF SYSTEMS:  Review of Systems  Gastrointestinal:  Positive for nausea.  Neurological:  Positive for dizziness and numbness.  All other systems reviewed and are negative.    PAST MEDICAL/SURGICAL HISTORY:  Past Medical History:  Diagnosis Date   Anxiety    Coronary atherosclerosis    Cardiac CT 09/2018 with calcium score 8.7 and mild proximal LAD disease   Essential hypertension 2009   GERD (gastroesophageal reflux disease)    Hypothyroidism    Iron deficiency anemia    Osteoarthritis    Palpitations    Tachypalpitations on beta blockers since 2010   Type 2 diabetes mellitus ([facility]) 2003   Vitamin B 12 deficiency    Vitamin D  deficiency    Past Surgical History:  Procedure Laterality Date   ABDOMINAL HYSTERECTOMY     History of cervical cancer   BIOPSY  12/16/2022   Procedure: BIOPSY;  Surgeon: Carrie Dolin, MD;  [facility];  Service: Endoscopy;;  gastric, rectal   BREAST BIOPSY Right    COLONOSCOPY WITH PROPOFOL N/A 12/16/2022   Procedure: COLONOSCOPY WITH PROPOFOL;  Surgeon: Carrie Dolin, MD;  [facility];  Service: Endoscopy;  Laterality: N/A;  10:45 am   ESOPHAGOGASTRODUODENOSCOPY (EGD) WITH PROPOFOL N/A 12/16/2022   Procedure: ESOPHAGOGASTRODUODENOSCOPY (EGD) WITH PROPOFOL;  Surgeon: Carrie Dolin, MD;  [facility];  Service: Endoscopy;  Laterality: N/A;   IR IMAGING GUIDED PORT INSERTION  01/15/2023   MALONEY DILATION N/A 12/16/2022   Procedure: Venia Minks DILATION;  Surgeon: Carrie Dolin, MD;  [facility];  Service: Endoscopy;  Laterality: N/A;   POLYPECTOMY  12/16/2022   Procedure: POLYPECTOMY;  Surgeon: Carrie Dolin, MD;  [facility];  Service: Endoscopy;;   TONSILLECTOMY       SOCIAL HISTORY:  Social History   Socioeconomic History   Marital status: Single    Spouse name: Not on file   Number of children: 2   Years of education: Not on file   Highest education level: Not on file  Occupational History   Occupation: DISABLED    Employer: UNEMPLOYED  Comment: [employer]  Tobacco Use   Smoking status: Never   Smokeless tobacco: Never  Vaping Use   Vaping Use: Never used  Substance and Sexual Activity   Alcohol use: No   Drug use: No   Sexual activity: Never  Other Topics Concern   Not on file  Social History Narrative   Has 2 grandchildren. 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   Social Determinants of Health   Financial Resource Strain: Not on file  Food Insecurity: No Food Insecurity (01/06/2023)   Hunger Vital Sign    Worried About Running Out of Food in the Last Year: Never true     Ran Out of Food in the Last Year: Never true  Transportation Needs: No Transportation Needs (01/06/2023)   PRAPARE - Hydrologist (Medical): No    Lack of Transportation (Non-Medical): No  Physical Activity: Not on file  Stress: Not on file  Social Connections: Not on file  Intimate Partner Violence: Not At Risk (01/06/2023)   Humiliation, Afraid, Rape, and Kick questionnaire    Fear of Current or Ex-Partner: No    Emotionally Abused: No    Physically Abused: No    Sexually Abused: No    FAMILY HISTORY:  Family History  Problem Relation Age of Onset   Heart failure Mother    Diabetes Father    Hypertension Father    Stroke Sister 41   Diabetes Brother    Cancer - Colon Other        age 67   Colon polyps Neg Hx     CURRENT MEDICATIONS:  Outpatient Encounter Medications as of 01/20/2023  Medication Sig   blood glucose meter kit and supplies KIT Dispense based on patient and insurance preference. Use up to four times daily as directed. (FOR ICD-10 E11.65)   Cholecalciferol (VITAMIN D3) 5000 units TABS Take 5,000 Units by mouth daily.   cyanocobalamin (VITAMIN B12) 1000 MCG/ML injection B12 shots at a frequency of once weekly for 4 weeks then can drop down to once monthly thereafter.   cyclobenzaprine (FLEXERIL) 10 MG tablet Take 10 mg by mouth 3 (three) times daily as needed for muscle spasms.   dextrose 5 % SOLN 1,000 mL with fluorouracil 5 GM/100ML SOLN Inject into the vein over 48 hr. Every 14 days   DROPLET PEN NEEDLES 31G X 5 MM MISC USE AS INSTRUCTED TO INJECT INSULIN DAILY   ferrous sulfate 325 (65 FE) MG EC tablet Take 325 mg by mouth daily.   fluconazole (DIFLUCAN) 150 MG tablet Take by mouth.   FLUOROURACIL IV Inject into the vein every 14 (fourteen) days.   FLUoxetine (PROZAC) 20 MG capsule Take 20 mg by mouth daily.   folic acid (FOLVITE) 1 MG tablet Take 1 tablet (1 mg total) by mouth daily.   insulin aspart (NOVOLOG FLEXPEN) 100 UNIT/ML  FlexPen INJECT 8 TO 14 UNITS UNDER THE SKIN THREE TIMES DAILY WITH MEALS   insulin glargine (LANTUS SOLOSTAR) 100 UNIT/ML Solostar Pen Inject 60 Units into the skin at bedtime.   IRINOTECAN HCL IV Inject into the vein every 14 (fourteen) days.   LEUCOVORIN CALCIUM IV Inject into the vein every 14 (fourteen) days.   levothyroxine (SYNTHROID) 88 MCG tablet TAKE 1 TABLET EVERY DAY BEFORE BREAKFAST   lidocaine-prilocaine (EMLA) cream Apply a quarter sized amount to port a cath site and cover with plastic wrap one hour prior to infusion appointments   lidocaine-prilocaine (EMLA)  cream Apply to affected area once   losartan (COZAAR) 100 MG tablet Take 100 mg by mouth daily.   magnesium oxide (MAG-OX) 400 MG tablet Take 400 mg by mouth at bedtime.   metFORMIN (GLUCOPHAGE) 1000 MG tablet Take 1 tablet (1,000 mg total) by mouth 2 (two) times daily.   nystatin cream (MYCOSTATIN) Apply twice per day to rash   OXALIPLATIN IV Inject into the vein every 14 (fourteen) days.   pantoprazole (PROTONIX) 40 MG tablet Take 40 mg by mouth daily.     pregabalin (LYRICA) 100 MG capsule Take 1 capsule (100 mg total) by mouth 2 (two) times daily.   tirzepatide (MOUNJARO) 10 MG/0.5ML Pen Inject 10 mg into the skin once a week.   TRUE METRIX BLOOD GLUCOSE TEST test strip TEST BLOOD SUGAR TWICE DAILY   prochlorperazine (COMPAZINE) 10 MG tablet Take 1 tablet (10 mg total) by mouth every 6 (six) hours as needed for nausea or vomiting. (Patient not taking: Reported on 01/20/2023)   prochlorperazine (COMPAZINE) 10 MG tablet Take 1 tablet (10 mg total) by mouth every 6 (six) hours as needed for nausea or vomiting (Nausea or vomiting). (Patient not taking: Reported on 01/20/2023)   Facility-Administered Encounter Medications as of 01/20/2023  Medication   [COMPLETED] sodium chloride flush (NS) 0.9 % injection 10 mL    ALLERGIES:  Allergies  Allergen Reactions   Contrast Media [Iodinated Contrast Media] Other (See Comments)     Burning   Sulfa Antibiotics Nausea And Vomiting    Other reaction(s): GI Upset (intolerance) unknown   Doxepin Rash and Swelling   Tape Rash     PHYSICAL EXAM:  ECOG Performance status: 1  There were no vitals filed for this visit. There were no vitals filed for this visit. Physical Exam Vitals reviewed.  Constitutional:      Appearance: Normal appearance.  Cardiovascular:     Rate and Rhythm: Normal rate and regular rhythm.     Heart sounds: Normal heart sounds.  Pulmonary:     Effort: Pulmonary effort is normal.     Breath sounds: Normal breath sounds.  Abdominal:     Palpations: Abdomen is soft. There is no mass.  Neurological:     Mental Status: She is alert.  Psychiatric:        Mood and Affect: Mood normal.        Behavior: Behavior normal.      LABORATORY DATA:  I have reviewed the labs as listed.  CBC    Component Value Date/Time   WBC 6.3 01/20/2023 0918   RBC 3.57 (L) 01/20/2023 0918   HGB 7.4 (L) 01/20/2023 0918   HGB 8.6 (L) 11/26/2022 1343   HCT 28.0 (L) 01/20/2023 0918   HCT 30.5 (L) 11/26/2022 1343   PLT 226 01/20/2023 0918   PLT 199 11/26/2022 1343   MCV 78.4 (L) 01/20/2023 0918   MCV 76 (L) 11/26/2022 1343   MCH 20.7 (L) 01/20/2023 0918   MCHC 26.4 (L) 01/20/2023 0918   RDW 20.5 (H) 01/20/2023 0918   RDW 16.6 (H) 11/26/2022 1343   LYMPHSABS 1.3 01/20/2023 0918   MONOABS 0.6 01/20/2023 0918   EOSABS 0.2 01/20/2023 0918   BASOSABS 0.1 01/20/2023 0918      Latest Ref Rng & Units 01/20/2023    9:18 AM 12/16/2022   10:52 AM 12/14/2022   11:22 AM  CMP  Glucose 70 - 99 mg/dL 136  169  222   BUN 8 - 23 mg/dL 10  7  10   Creatinine 0.44 - 1.00 mg/dL 0.81  0.87  0.87   Sodium 135 - 145 mmol/L 136  135  134   Potassium 3.5 - 5.1 mmol/L 3.9  3.7  4.2   Chloride 98 - 111 mmol/L 105  101  101   CO2 22 - 32 mmol/L '23  26  23   '$ Calcium 8.9 - 10.3 mg/dL 8.4  8.3  8.8   Total Protein 6.5 - 8.1 g/dL 6.5  6.4    Total Bilirubin 0.3 - 1.2 mg/dL 0.6   0.7    Alkaline Phos 38 - 126 U/L 49  42    AST 15 - 41 U/L 36  41    ALT 0 - 44 U/L 13  16      DIAGNOSTIC IMAGING:  I have independently reviewed the scans and discussed with the patient.  ASSESSMENT:  1.  Stage IIc (T3d/4b N0 M0) rectal cancer: - Rectal bleeding for the past 4 to 5 years. - Colonoscopy (12/16/2022): Tumor protruding through the anal orifice.  Bulky semilunar lateral rectal tumor extending from the anorectal junction proximally about 13 cm. 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  MSI-high not detected by [id number] - CT CAP (12/17/2022): Locally advanced low rectal primary without bowel obstruction or nodal metastasis.  Isolated right middle lobe subpleural lung nodule most likely benign.  Cirrhosis and portal venous hypertension.  2.6 cm right renal lesion with differential hemorrhagic/proteinaceous cyst or solid neoplasm. - PET scan (12/31/2022): Markedly hypermetabolic rectal lesion with no evidence for perirectal or pelvic lymphadenopathy.  No metastatic disease in the neck, chest, abdomen or pelvis.  Focal hypermetabolism in the region of the gallbladder neck corresponds to subtle 13 mm focus of increased attenuation in the lumen of the gallbladder.  Differential includes gallbladder polyp/neoplasm.  2.7 cm exophytic posterior right interpolar renal lesion shows no substantial hypermetabolism, likely cyst.  2.3 cm inferior right thyroid nodule without hypermetabolism. - MRI pelvis (12/24/2022): Extension through muscularis propria.  Tumor size 3.8 x 3 x 5.8 cm with a volume 35 cm.  T stage is T3d, probable T4.  Suspicion of involvement of pelvic floor musculature and the far posterior and left side of the vagina.   2.  Social/family history: - She lives at home with her family.  She is accompanied by her daughter today.  She is independent of ADLs and IADLs.  She is a retired Freight forwarder at [employer].   Non-smoker.  She reports having hysterectomy in 1986 for cancerous polyps in her endometrium. - Maternal grandmother's sister had colon cancer.   PLAN:   1.  Stage IIc (T3d/4BN0M0) rectal cancer, MSI stable: - We reviewed her labs today which showed normal LFTs and low albumin of 3.2.  CBC shows normal white count and platelet count. - We discussed side effects of FOLFOX [place] in detail.  We will dose reduce oxaliplatin at 55 mg/m due to pre-existing neuropathy. - She will proceed with cycle 1 today.  RTC 2 weeks for follow-up.   2.  Peripheral neuropathy from diabetes: - She has no feeling in the toes at all.  Rest of the feet are normal.  No numbness in the fingertips. - Continue Lyrica 100 mg twice daily.   3.  Severe microcytic anemia: - She received first dose of Venofer 300 mg on 01/06/2023.  She will receive her next dose on 01/27/2023.  She felt better after the first  infusion.  She is taking iron tablet and B12 tablet daily. - Hemoglobin today 7.4.  Will closely monitor.  4.  Folic acid deficiency: - Folic acid was severely low at 3.7.  Will start her on folic acid 1 mg tablet daily.       Orders placed this encounter:  No orders of the defined types were placed in this encounter.     Derek Jack, MD [facility] [phone]

## 2023-01-20 NOTE — Progress Notes (Signed)
Patient has been examined by Dr. Delton Coombes. Vital signs and labs have been reviewed by MD - ANC, Creatinine, LFTs, hemoglobin (7.4), and platelets are within treatment parameters per M.D. - pt may proceed with treatment.  Primary RN and pharmacy notified.

## 2023-01-20 NOTE — Progress Notes (Signed)
Venofer schedule dosing modification:  Venofer 400 mg IVPB on 01/27/23 Venofer 300 mg IVPB on 02/03/23  T.O. Dr Rhys Martini, PharmD

## 2023-01-20 NOTE — Patient Instructions (Addendum)
[facility]  Discharge Instructions  You were seen and examined by Dr. Delton Coombes today.  He reviewed the results of your lab work which are normal/stable with exception of your hemoglobin which is low at 7.4.   He sent a prescription for you for folic acid. You will take one pill daily. This along with the scheduled iron infusions will help get your hemoglobin up.   We will proceed with your first treatment today.   Return as scheduled.    Thank you for choosing [facility] to provide your oncology and hematology care.   To afford each patient quality time with our provider, please arrive at least 15 minutes before your scheduled appointment time. You may need to reschedule your appointment if you arrive late (10 or more minutes). Arriving late affects you and other patients whose appointments are after yours.  Also, if you miss three or more appointments without notifying the office, you may be dismissed from the clinic at the provider's discretion.    Again, thank you for choosing [facility].  Our hope is that these requests will decrease the amount of time that you wait before being seen by our physicians.   If you have a lab appointment with the [facility] please come in thru the Main Entrance and check in at the main information desk.           _____________________________________________________________  Should you have questions after your visit to [facility], please contact our office at [phone] and follow the prompts.  Our office hours are 8:00 a.m. to 4:30 p.m. Monday - Thursday and 8:00 a.m. to 2:30 p.m. Friday.  Please note that voicemails left after 4:00 p.m. may not be returned until the following business day.  We are closed weekends and all major holidays.  You do have access to a nurse 24-7, just call the main number to the clinic [phone] and do not press any options, hold on  the line and a nurse will answer the phone.    For prescription refill requests, have your pharmacy contact our office and allow 72 hours.    Masks are optional in the cancer centers. If you would like for your care team to wear a mask while they are taking care of you, please let them know. You may have one support person who is at least 66 years old accompany you for your appointments.

## 2023-01-20 NOTE — Progress Notes (Signed)
Pt presents today for C1D1 Folirinox per provider's order. Vital signs and other labs WNL for treatment. Pt's hemoglobin is 7.4 Dr.K aware. Okay to proceed with treatment today per Dr.K.  After Oxaliplatin was done, pt c/o headache, Dr.K made aware and stated to give Tylenol 650 mg p.o x 1 dose. Pt stated  headache was not that bad and did not want to take the Tylenol. Pt made aware that Tylenol is available if she changed her mind. Pt made aware and verbalized understanding.   While pt was receiving Irinotecan she c/o diarrhea and nausea, Dr.K aware and stated to give Atropine 0.5 mg and Compazine 10 mg IV x 1 dose.  C1D1 Folfirinox given today per MD orders. Tolerated infusion without adverse affects. Vital signs stable. No complaints at this time. Discharged from clinic via wheelchair in stable condition. Alert and oriented x 3. F/U with [facility] as scheduled. 5FU ambulatory pump infusing.

## 2023-01-21 ENCOUNTER — Inpatient Hospital Stay: Payer: Medicare HMO | Admitting: Licensed Clinical Social Worker

## 2023-01-21 DIAGNOSIS — C20 Malignant neoplasm of rectum: Secondary | ICD-10-CM

## 2023-01-21 NOTE — Progress Notes (Signed)
[facility] CSW Progress Note  Clinical Education officer, museum  received signed Acknowledgment Form for [employer] and proof of income from pt.  Paperwork forwarded to Arboriculturist on behalf of pt.  CSW to continue to provide support as appropriate throughout duration of treatment.        Henriette Combs, LCSW

## 2023-01-22 ENCOUNTER — Inpatient Hospital Stay: Payer: Medicare HMO | Attending: Hematology

## 2023-01-22 VITALS — BP 104/66 | HR 97 | Temp 97.6°F | Resp 18

## 2023-01-22 DIAGNOSIS — R197 Diarrhea, unspecified: Secondary | ICD-10-CM | POA: Insufficient documentation

## 2023-01-22 DIAGNOSIS — D509 Iron deficiency anemia, unspecified: Secondary | ICD-10-CM | POA: Diagnosis not present

## 2023-01-22 DIAGNOSIS — Z5189 Encounter for other specified aftercare: Secondary | ICD-10-CM | POA: Insufficient documentation

## 2023-01-22 DIAGNOSIS — Z5111 Encounter for antineoplastic chemotherapy: Secondary | ICD-10-CM | POA: Diagnosis present

## 2023-01-22 DIAGNOSIS — C20 Malignant neoplasm of rectum: Secondary | ICD-10-CM

## 2023-01-22 MED ORDER — SODIUM CHLORIDE 0.9% FLUSH
10.0000 mL | INTRAVENOUS | Status: DC | PRN
  Administered 2023-01-22: 10 mL

## 2023-01-22 MED ORDER — HEPARIN SOD (PORK) LOCK FLUSH 100 UNIT/ML IV SOLN
500.0000 [IU] | Freq: Once | INTRAVENOUS | Status: AC | PRN
  Administered 2023-01-22: 500 [IU]

## 2023-01-22 NOTE — Patient Instructions (Signed)
[facility]  Discharge Instructions: Thank you for choosing [facility] to provide your oncology and hematology care.  If you have a lab appointment with the [facility], please come in thru the Main Entrance and check in at the main information desk.  Wear comfortable clothing and clothing appropriate for easy access to any Portacath or PICC line.   We strive to give you quality time with your provider. You may need to reschedule your appointment if you arrive late (15 or more minutes).  Arriving late affects you and other patients whose appointments are after yours.  Also, if you miss three or more appointments without notifying the office, you may be dismissed from the clinic at the provider's discretion.      For prescription refill requests, have your pharmacy contact our office and allow 72 hours for refills to be completed.    Today you received pump d/c     BELOW ARE SYMPTOMS THAT SHOULD BE REPORTED IMMEDIATELY: *FEVER GREATER THAN 100.4 F (38 C) OR HIGHER *CHILLS OR SWEATING *NAUSEA AND VOMITING THAT IS NOT CONTROLLED WITH YOUR NAUSEA MEDICATION *UNUSUAL SHORTNESS OF BREATH *UNUSUAL BRUISING OR BLEEDING *URINARY PROBLEMS (pain or burning when urinating, or frequent urination) *BOWEL PROBLEMS (unusual diarrhea, constipation, pain near the anus) TENDERNESS IN MOUTH AND THROAT WITH OR WITHOUT PRESENCE OF ULCERS (sore throat, sores in mouth, or a toothache) UNUSUAL RASH, SWELLING OR PAIN  UNUSUAL VAGINAL DISCHARGE OR ITCHING   Items with * indicate a potential emergency and should be followed up as soon as possible or go to the Emergency Department if any problems should occur.  Please show the CHEMOTHERAPY ALERT CARD or IMMUNOTHERAPY ALERT CARD at check-in to the Emergency Department and triage nurse.  Should you have questions after your visit or need to cancel or reschedule your appointment, please contact [facility]  [phone]  and follow the prompts.  Office hours are 8:00 a.m. to 4:30 p.m. Monday - Friday. Please note that voicemails left after 4:00 p.m. may not be returned until the following business day.  We are closed weekends and major holidays. You have access to a nurse at all times for urgent questions. Please call the main number to the clinic [phone] and follow the prompts.  For any non-urgent questions, you may also contact your provider using MyChart. We now offer e-Visits for anyone 79 and older to request care online for non-urgent symptoms. For details visit [email or web address].   Also download the MyChart app! Go to the app store, search "MyChart", open the app, select [facility], and log in with your MyChart username and password.

## 2023-01-22 NOTE — Progress Notes (Signed)
Pt presents today for 5FU chemotherapy pump disconnection per provider's order. Vital signs stable and port flushed easily without difficulty with 10 mL of normal saline and 5 mL of heparin. Good blood return needle removed intact. No bruising or swelling noted at the site.  Discharged from clinic via wheelchair in stable condition. Alert and oriented x 3. F/U with [facility] as scheduled.

## 2023-01-25 ENCOUNTER — Telehealth: Payer: Self-pay

## 2023-01-25 NOTE — Telephone Encounter (Signed)
24 hour follow up. Patient had her ambulatory pump disconnected on Friday March 1st. Spoke with patient today, she is feeling pretty good. Per Pt, ate some things this past weekend that did not settle very well. Today she feels much better. She has a follow up appointment on Wednesday with PA and has an iron infusion.

## 2023-01-26 ENCOUNTER — Ambulatory Visit (INDEPENDENT_AMBULATORY_CARE_PROVIDER_SITE_OTHER): Payer: Medicare HMO | Admitting: Gastroenterology

## 2023-01-26 ENCOUNTER — Encounter: Payer: Self-pay | Admitting: Gastroenterology

## 2023-01-26 VITALS — BP 113/71 | HR 106 | Temp 97.8°F | Ht 67.0 in | Wt 276.1 lb

## 2023-01-26 DIAGNOSIS — R197 Diarrhea, unspecified: Secondary | ICD-10-CM | POA: Diagnosis not present

## 2023-01-26 DIAGNOSIS — K6289 Other specified diseases of anus and rectum: Secondary | ICD-10-CM | POA: Diagnosis not present

## 2023-01-26 DIAGNOSIS — C20 Malignant neoplasm of rectum: Secondary | ICD-10-CM

## 2023-01-26 DIAGNOSIS — D509 Iron deficiency anemia, unspecified: Secondary | ICD-10-CM | POA: Diagnosis not present

## 2023-01-26 DIAGNOSIS — R630 Anorexia: Secondary | ICD-10-CM

## 2023-01-26 DIAGNOSIS — K7460 Unspecified cirrhosis of liver: Secondary | ICD-10-CM | POA: Diagnosis not present

## 2023-01-26 DIAGNOSIS — K219 Gastro-esophageal reflux disease without esophagitis: Secondary | ICD-10-CM

## 2023-01-26 MED ORDER — PANTOPRAZOLE SODIUM 40 MG PO TBEC: 40.0000 mg | DELAYED_RELEASE_TABLET | Freq: Two times a day (BID) | ORAL | 3 refills | Status: DC

## 2023-01-26 NOTE — Progress Notes (Signed)
GI Office Note    Referring Provider: Nickola Major, MD Primary Care Physician:  Nickola Major, MD Primary Gastroenterologist: Cristopher Estimable.Rourk, MD  Date:  01/26/2023  ID:  [name], DOB [birth year], MRN [id number]   Chief Complaint   Chief Complaint  Patient presents with   Follow-up    Patient here today for a follow up visit. She is having issues with diarrhea and nausea  at times. Patient reports she has seen some rectal bleeding on going since Sunday.     History of Present Illness  [name] is a 67 y.o. female with a history of IDDM with diabetic peripheral neuropathy, RAI induced hypothyroidism, HTN, HLD, aortic valve disease, CAD, GERD, and IDA presenting today with complaint of diarrhea and intermittent nausea.  Labs June 2022: Hemoglobin 7.8, MCV 63.2, platelets 167, iron 17, ferritin 3, saturation 4%, B12 216   Labs March 2023: Iron 28, ferritin 9, saturation 7%, B12 859   Labs April 2023: Hemoglobin 10.7, MCV 72.7, platelets unable to verify due to clumping.     Per referral paperwork received from PCP patient had positive Cologuard test in November 2019.  Last office visit note 10/09/2022 with PCP.  Etiology of IDA unknown, history of positive Cologuard, unable to complete colonoscopy due to transportation issue.  Noted to have GERD that was controlled on PPI.  Previous symptoms were dizziness and fatigue as well as dyspnea improved after starting oral iron 1 year prior.  Complained of new dizziness, fatigue, dyspnea over the last few days.  Noted to have a low hemoglobin of 7 in late 2022.  History of Rehman exam, taking magnesium oxide 400 mg nightly to help with leg cramps.  On pravastatin for HLD, strong family history of ASCVD.  Cardiac CT in 2019 with calcium score 8.7 and mild proximal LAD disease.  Taking Synthroid daily.  On Lantus 60 units nightly and NovoLog 8 to 14 units 3 times daily before meals, Mounjaro, and metformin.  CBC and iron panel ordered  as well as referral to GI.  Patient stated she was ready for colonoscopy.  Advised to continue PPI and consider upper endoscopy as well.  Previously with B12 deficiency, not on supplementation.  Plan to recheck.   Labs 10/09/2022: Iron 20, ferritin 8, saturation 5%, hemoglobin 9.6, MCV 77.9, platelets 151, B12 420  Last office visit 11/26/2022.  She reported ongoing symptoms of GERD, not having good appetite and was having early satiety.  Denies any weight loss.  Reported chronic use of PPI since 2009 and was having some dysphagia that was worsening over the last 3 to 4 months.  Denied any nausea/vomiting.  Reported drinking a lot of water.  Has been on oral iron once daily for couple of years and previously was on B12 injections that she had recently restarted this past summer.  Has had some periods of shortness of breath and has constant fatigue.  Was having some BRBPR usually with stools.  Denies any constipation.  Has not needed any stool softener in years.  Denies any melena.  Did admit to occasional palpitations but denied any chest pain.  CBC was checked.  Advised to continue iron and PPI.  Scheduled for upper endoscopy with possible dilation and colonoscopy with Dr. Gala Romney.  Advised may need to consider hematology referral for IV iron.  EGD 12/16/2022: -Normal esophagus s/p dilation -Abnormal appearing stomach of uncertain significance s/p biopsy -Duodenal diverticulum -Stomach biopsy with mild chronic gastritis with lymphoid  aggregate negative for H. 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  CT chest, abdomen, and pelvis 12/17/2022: -Locally advanced low rectal primary without bowel obstruction or nodule metastasis -Isolated right middle lobe subpleural pulmonary nodule most likely benign recommend attention on follow-up -Cirrhosis and portal venous hypertension with several tiny low-density foci within the liver most likely degenerative nodules no evidence of hepatic metastasis -Complex 2.6 cm right renal lesion could represent a hemorrhagic/proteinaceous cyst or solid neoplasm which could be reevaluated with pre and postcontrast abdominal imaging with follow-up CT -Right-sided thyroid/isthmic nodule of 1.8 cm recommend thyroid ultrasound -Cholelithiasis  Patient underwent pelvic MRI 12/24/2022 revealing rectal adenocarcinoma stage T3d, 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  Distance from tumor to i internal sphincter is 2.8 cm  PET scan 12/31/2022 with no rectal lesions without hypermetabolic perirectal or pelvic lymphadenopathy -Subtle too small to characterize hypoattenuating lesions in the hepatic dome previous CT without any detectable hypermetabolism at -2.3 cm inferior right thyroid nodule without hypermetabolism -2.7 cm exophytic posterior right interpolar renal lesion without hypermetabolism likely a cyst complicated by proteinaceous debris or hemorrhage -Focal hypermetabolism in the region of the gallbladder neck that corresponds to 13 mm focus of increased attenuation in the lumen of the gallbladder.  Given FDG accumulation in this region gallbladder polyp/neoplasm is a concern.  RUQ [place] recommended to further evaluate.  Labs 12/16/2022 with normal CEA, hemoglobin 8.7, MCV 77.1, platelets 207, albumin 3.2, AST 41, ALT 16, alk phos 42, T. bili 0.7, A1c 6.6  Most recent labs 01/20/2023 with hemoglobin 7.4, MCV 78.4, platelets 226, glucose 136, albumin 3.2, AST 36, ALT 13, alk phos 49, T. bili 0.6, sodium 136, creatinine 0.81, magnesium 1.7  Today: Following with Dr. Delton Coombes  regarding rectal tumor.  Reports Sunday she has had a lot of bleeding rectally. She feels as though she has lost a gallon of blood. Today she has no energy or appetite. Having lower abdominal pain that is severe in nature. Occurring all the time right now. Has had bleeding with every bowel movement until this morning. Has not had blood work down since the bleeding occurred. Has not drank or eaten anything at all today. Has something for nasuea but reports she is not hat nauseas. Has food aversion. Diarrhea started last week at chemo treatment and then got better and then started again yesterday. She was given something at the cancer center and told to take imodium. No alcohol use. No tobacco use.   Has 7 more chemo treatments and when the 18 weeks is up she will have 5 weeks of radiation and then do surgery which will be done by [facility] [place] [facility]. No abdominal swelling. Had some swelling in her legs recently but now back on BP meds and this has improved. Nausea comes and goes. Swallowing is good. Acid reflux at times is a little worse. And when she burps it is releasing gas from he stomach.   Since the last time she was weighed she has lost 15 lbs.   Had first chemo treatment last week and this week she is to have an iron infusion and will alternate those. Blood sugars have been  good.    Current Outpatient Medications  Medication Sig Dispense Refill   blood glucose meter kit and supplies KIT Dispense based on patient and insurance preference. Use up to four times daily as directed. (FOR ICD-10 E11.65) 1 each 0   Cholecalciferol (VITAMIN D3) 5000 units TABS Take 5,000 Units by mouth daily.     cyanocobalamin (VITAMIN B12) 1000 MCG/ML injection B12 shots at a frequency of once weekly for 4 weeks then can drop down to once monthly thereafter.     cyclobenzaprine (FLEXERIL) 10 MG tablet Take 10 mg by mouth 3 (three) times daily as needed for muscle spasms.     dextrose 5 % SOLN 1,000 mL with  fluorouracil 5 GM/100ML SOLN Inject into the vein over 48 hr. Every 14 days     DROPLET PEN NEEDLES 31G X 5 MM MISC USE AS INSTRUCTED TO INJECT INSULIN DAILY 100 each 3   ferrous sulfate 325 (65 FE) MG EC tablet Take 325 mg by mouth daily.     FLUOROURACIL IV Inject into the vein every 14 (fourteen) days.     FLUoxetine (PROZAC) 20 MG capsule Take 20 mg by mouth daily.     folic acid (FOLVITE) 1 MG tablet Take 1 tablet (1 mg total) by mouth daily. 30 tablet 6   insulin aspart (NOVOLOG FLEXPEN) 100 UNIT/ML FlexPen INJECT 8 TO 14 UNITS UNDER THE SKIN THREE TIMES DAILY WITH MEALS 30 mL 0   insulin glargine (LANTUS SOLOSTAR) 100 UNIT/ML Solostar Pen Inject 60 Units into the skin at bedtime. 30 mL 0   IRINOTECAN HCL IV Inject into the vein every 14 (fourteen) days.     LEUCOVORIN CALCIUM IV Inject into the vein every 14 (fourteen) days.     levothyroxine (SYNTHROID) 88 MCG tablet TAKE 1 TABLET EVERY DAY BEFORE BREAKFAST 90 tablet 1   lidocaine-prilocaine (EMLA) cream Apply a quarter sized amount to port a cath site and cover with plastic wrap one hour prior to infusion appointments 30 g 0   lidocaine-prilocaine (EMLA) cream Apply to affected area once 30 g 3   losartan (COZAAR) 100 MG tablet Take 100 mg by mouth daily.     magnesium oxide (MAG-OX) 400 MG tablet Take 400 mg by mouth at bedtime.     metFORMIN (GLUCOPHAGE) 1000 MG tablet Take 1 tablet (1,000 mg total) by mouth 2 (two) times daily. 180 tablet 10   nystatin cream (MYCOSTATIN) Apply twice per day to rash     OXALIPLATIN IV Inject into the vein every 14 (fourteen) days.     pregabalin (LYRICA) 100 MG capsule Take 1 capsule (100 mg total) by mouth 2 (two) times daily. 180 capsule 1   prochlorperazine (COMPAZINE) 10 MG tablet Take 1 tablet (10 mg total) by mouth every 6 (six) hours as needed for nausea or vomiting. 30 tablet 3   tirzepatide (MOUNJARO) 10 MG/0.5ML Pen Inject 10 mg into the skin once a week. 6 mL 1   TRUE METRIX BLOOD GLUCOSE  TEST test strip TEST BLOOD SUGAR TWICE DAILY 200 strip 3   pantoprazole (PROTONIX) 40 MG tablet Take 1 tablet (40 mg total) by mouth 2 (two) times daily before a meal. 60 tablet 3   No current facility-administered medications for this visit.    Past Medical History:  Diagnosis Date   Anxiety    Coronary atherosclerosis    Cardiac CT 09/2018 with calcium score 8.7 and mild proximal LAD disease   Essential hypertension 2009   GERD (  gastroesophageal reflux disease)    Hypothyroidism    Iron deficiency anemia    Osteoarthritis    Palpitations    Tachypalpitations on beta blockers since 2010   Type 2 diabetes mellitus ([place]) 2003   Vitamin B 12 deficiency    Vitamin D deficiency     Past Surgical History:  Procedure Laterality Date   ABDOMINAL HYSTERECTOMY     History of cervical cancer   BIOPSY  12/16/2022   Procedure: BIOPSY;  Surgeon: Daneil Dolin, MD;  [facility];  Service: Endoscopy;;  gastric, rectal   BREAST BIOPSY Right    COLONOSCOPY WITH PROPOFOL N/A 12/16/2022   Procedure: COLONOSCOPY WITH PROPOFOL;  Surgeon: Daneil Dolin, MD;  [facility]: [facility];  Service: Endoscopy;  Laterality: N/A;  10:45 am   ESOPHAGOGASTRODUODENOSCOPY (EGD) WITH PROPOFOL N/A 12/16/2022   Procedure: ESOPHAGOGASTRODUODENOSCOPY (EGD) WITH PROPOFOL;  Surgeon: Daneil Dolin, MD;  [facility];  Service: Endoscopy;  Laterality: N/A;   IR IMAGING GUIDED PORT INSERTION  01/15/2023   MALONEY DILATION N/A 12/16/2022   Procedure: Venia Minks DILATION;  Surgeon: Daneil Dolin, MD;  [facility]: [facility];  Service: Endoscopy;  Laterality: N/A;   POLYPECTOMY  12/16/2022   Procedure: POLYPECTOMY;  Surgeon: Daneil Dolin, MD;  [facility]: [facility];  Service: Endoscopy;;   TONSILLECTOMY      Family History  Problem Relation Age of Onset   Heart failure Mother    Diabetes Father    Hypertension Father    Stroke Sister 36   Diabetes Brother    Cancer - Colon Other         age 61   Colon polyps Neg Hx     Allergies as of 01/26/2023 - Review Complete 01/26/2023  Allergen Reaction Noted   Contrast media [iodinated contrast media] Other (See Comments) 10/04/2018   Sulfa antibiotics Nausea And Vomiting 04/02/2011   Doxepin Rash and Swelling 04/02/2011   Tape Rash 01/20/2023    Social History   Socioeconomic History   Marital status: Single    Spouse name: Not on file   Number of children: 2   Years of education: Not on file   Highest education level: Not on file  Occupational History   Occupation: DISABLED    Employer: UNEMPLOYED    Comment: [employer]  Tobacco Use   Smoking status: Never   Smokeless tobacco: Never  Vaping Use   Vaping Use: Never used  Substance and Sexual Activity   Alcohol use: No   Drug use: No   Sexual activity: Never  Other Topics Concern   Not on file  Social History Narrative   Has 2 grandchildren. 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 Denies fatigue, weakness, weight loss.  CV: Denies chest pain, palpitations, syncope, peripheral edema, and  claudication. Resp: Denies dyspnea at rest, cough, wheezing, coughing up blood, and pleurisy. GI: See HPI Derm: Denies rash, itching, dry skin Psych: Denies depression, anxiety, memory loss, confusion. No homicidal or suicidal ideation.  Heme: Denies bruising, bleeding, and  enlarged lymph nodes.   Physical Exam   BP 113/71 (BP Location: Left Arm, Patient Position: Sitting, Cuff Size: Normal)   Pulse (!) 106   Temp 97.8 F (36.6 C) (Temporal)   Ht '5\' 7"'$  (1.702 m)   Wt 276 lb 1.6 oz (125.2 kg)   BMI 43.24 kg/m   General:   Alert and oriented. No distress noted. Pleasant and cooperative.  Head:  Normocephalic and atraumatic. Eyes:  Conjuctiva clear without scleral icterus. Mouth:  Oral mucosa pink and moist. Good dentition. No lesions. Lungs:  Clear to auscultation bilaterally. No wheezes, rales, or rhonchi. No distress.  Heart:  S1, S2 present without murmurs appreciated.  Abdomen:  +BS, soft, non-tender and non-distended. No rebound or guarding. No HSM or masses noted. Rectal: deferred Msk:  Symmetrical without gross deformities. Normal posture. Extremities:  Without edema. Neurologic:  Alert and  oriented x4 Psych:  Alert and cooperative. Normal mood and affect.   Assessment  [name] is a 67 y.o. female with a history of IDDM with diabetic peripheral neuropathy, RAI induced hypothyroidism, HTN, HLD, aortic valve disease, CAD, GERD, and IDA, newly diagnosed rectal cancer following with oncology who presents today for follow-up with complaint of intermittent diarrhea, rectal bleeding, and nausea.  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  She had tumor protruding through the anal orifice.  She has had follow-up CT chest, abdomen, and pelvis as well as pelvic MRI and PET scan.  CT scan also revealed right renal lesion unable to classify neoplasm versus cyst.  Also has some cholelithiasis.  Pelvic MRI revealed staging to be T 3D, probably T4, N0.  PET scan with no rectal lesions with no hypermetabolic perirectal or pelvic lymphadenopathy.  On characterizable lesions in the hepatic dome.  She did  have focal hypermetabolism in the region of the gallbladder neck which could be gallbladder polyp or neoplasm.  Will need right upper quadrant ultrasound to further evaluate.  She was referred to oncology has been following with Dr. Delton Coombes.  She has had 1 chemo treatment thus far and is due for iron infusion tomorrow.  She will complete 7 more chemo treatments and then 5 weeks of radiation and then will see [place] [facility] again for tumor debulking.  Started having some rectal bleeding on Sunday however has started to improve today.  She does feel that she has lost a lot of blood therefore we will recheck CBC today to determine if blood versus IV iron is needed at her infusion tomorrow.  She also has been experiencing a lack of appetite as well as intermittent diarrhea with abdominal pain.  This all could be secondary to her recent chemo treatment as well as side effect of her rectal neoplasm.  Advised her to continue Imodium as needed for diarrhea and to take daily probiotic for this.  She currently is not having much abdominal pain.  I let her know to reach out if pain becomes more severe.  Also advised her to mention this to oncology tomorrow.  Encourage patient to discuss her appetite with oncology tomorrow or she may let me know if this is  not improved within the next 2 days as this is a new symptom that has began.  Nausea and lack of appetite are side effects of chemotherapy and mild cirrhosis could also be playing a role.  May consider Remeron or Megace to help with appetite stimulation  Cirrhosis: Revealed on recent CT C/A/P in July 2024 for evaluation of rectal tumor.  She also has evidence of portal venous hypertension with splenomegaly.  Currently appears well compensated and summarize recent lab work reveals normal LFTs, alk phos, and bilirubin.  Despite evidence of portal venous hypertension on imaging she has a normal platelet count currently.  She does have mild anemia however this is  likely secondary to her rectal neoplasm.  Will do limited workup with viral hepatitis panel as of now.  Will consider additional workup in the future and cirrhosis likely secondary to metabolic disease given her longstanding history of diabetes.  She is currently following with a nutritionist and I encouraged her to follow a 2 g sodium diet as well as focusing on high-protein and plant-based diet.  No indication for diuretics at this time.  IDA: Most recent hemoglobin 7.4 on 01/20/2023.  Given her recent rectal bleeding episodes we will recheck CBC today.  She has been following with oncology as noted above.  Due to received iron infusion tomorrow.  Anemia likely secondary to rectal neoplasm as her EGD was overall unremarkable.  She has continued to take oral iron supplementation.  As well as B12 injections.  GERD: Symptoms have not been well-controlled with daily dosing of pantoprazole.  GERD diet/lifestyle modifications reinforced.  Will increase PPI to twice daily.  Prescription updated.   PLAN   Pantoprazole 40 mg BID, new prescription sent. CBC, CMP, hepatitis A antibody, hepatitis B surface antigen, hepatitis C antibody Start a daily probiotic Continue daily iron and B12 injections Imodium as needed Continue Zofran as needed.  High-protein diet from a primarily plant-based diet. Avoid red meat.  No raw or undercooked meat, seafood, or shellfish. Low-fat/cholesterol/carbohydrate diet. Limit sodium to no more than 2000 mg/day including everything that you eat and drink. Reach out to the office if you begin having any worsening abdominal pain or appetite does not improve in the next 2 days. Follow up in 3 months, possibly consider RUQ [place] to further evaluate for any liver lesions as well as reassess gallbladder.   Venetia Night, MSN, FNP-BC, AGACNP-BC [facility]

## 2023-01-26 NOTE — Progress Notes (Unsigned)
[name]. [address] [place] [address] Phone: [phone] Fax: [phone]  SYMPTOM MANAGEMENT CLINIC PROGRESS NOTE   AVAMARIE TOSCANO CP:[id number] 1956/08/09 67 y.o.  [name] is managed by Dr. Delton Coombes for her stage IIc rectal cancer  Actively treated with chemotherapy/immunotherapy/hormonal therapy: YES  Current therapy: FOLFIRINOX  Last treated: D1/C1 01/20/2023  INTERVAL HISTORY:  Chief Complaint: Chemotherapy follow-up & Symptom management visit  [name] is managed by Dr. Delton Coombes for her stage IIc rectal cancer.  She had D1/C1 of FOLFIRINOX on 01/20/2023, with pump D/C on 01/22/2023.  Patient reports that she tolerated initial chemotherapy infusions very well, and had some initially improved energy.  However, she reports onset of severe rectal bleeding on Sunday 01/24/2023 which continued through yesterday morning (Tuesday 01/26/2023).  She had some "milder" bleeding today described as some red blood on toilet paper when she wiped and some clots passed with her bowel movement this morning.  She does not take any blood thinners.  Since onset of rectal bleeding, she has had severe fatigue.  She denies any chest pain or difficulty breathing.  She is hemodynamically stable at her visit today.  Overall, she has had good appetite but reports that yesterday after her rectal bleeding she had extremely poor appetite and poor oral intake.  In the past 48 hours, she has only had about 20 ounces of water, a handful of snacks, and some broth.  Weight today is 269 pounds, which is down 22 pounds in the past week - unclear if this is an error or represents her true weight; patient reports that she "feels like she has lost about 5 pounds or so in the past week."  She does not drink any Boost or Ensure at home.  She has had some nausea without vomiting; she is not taking any antiemetics.  She reports onset of watery diarrhea yesterday, has had 4-5 stools in  the past 24 hours.  She reports some intermittent pelvic pain, usually 3/10 to 5/10, but with some severe pelvic pain yesterday at 10/10.  PERTINENT NEGATIVES: No mouth sores, changes in urine, rash/skin changes, peripheral edema, chest pain, shortness of breath, dyspnea.  No neuropathy or cold sensitivity to date.  ASSESSMENT & PLAN:  ## STAGE IIC RECTAL CANCER - Primary oncologist is Dr. Delton Coombes - D1/C1 of FOLFIRINOX on 01/20/2023, with pump D/C on 01/22/2023 - PLAN: She is scheduled for MD visit and cycle #2 of treatment starting next week (02/03/2023)  # Acute blood loss anemia - Intermittent hematochezia since the time of diagnosis - Reported severe rectal bleeding from Sunday, 01/24/2023 through Tuesday, 01/26/2023.  Continues to have some "milder" rectal bleeding today.   - Hgb today 4.8, down from Hgb 7.4 on 01/20/2023 - Iron studies from 01/04/2023 show ferritin 4 and iron saturation 4%.  She was initially scheduled for IV Venofer today, but this will be postponed in favor of given blood transfusions. - She is hemodynamically stable.  Reports significant fatigue and lightheadedness with standing, but denies any chest pain or difficulty breathing. - Per discussion with Dr. Gala Romney (GI): "Bulky lesion extends down into the anal canal. Repeated trauma with every bowel movement. Likely not bleeding from any other lesion.  Recommend stool softener Colace 100 mg twice daily liberal use of laxative therapy. Keep up with H&H and transfuse as needed. No need for any repeat endoscopic evaluation at this time. " - Per discussion with Dr. Delton Coombes (primary oncologist): Manage outpatient as  long as patient is hemodynamically stable and does not have any recurrent severe rectal bleeding. - PLAN: Transfuse 2 units PRBC today.  (Patient agreeable, attestation signed) - Repeat CBC with possible blood transfusions tomorrow and Friday, as well as Monday of next week - STRICT RETURN PRECAUTIONS - If patient has  any recurrence of severe rectal bleeding or symptoms of chest pain, shortness of breath, syncope, or altered mental status that she should proceed immediately to the Emergency Department.  Very low threshold for ED or direct admission. -- We will reschedule her for Venofer 400 mg x 3 doses  # Chemotherapy-induced pancytopenia - CBC today shows Hgb 4.8/MCV 76.6 (partly due to acute blood loss anemia as above).  WBC 1.3/ANC 0.7/lymphocytes 0.6/monocytes 0.0.  Platelets 115. - Of note, patient does also have underlying liver cirrhosis, but has previously had normal WBCs and platelets. - PLAN: Per Dr. Delton Coombes, no G-CSF needed unless she becomes febrile.  Neutropenic and infection prevention education given to patient.  # Diarrhea - Reports new onset diarrhea with 4-5 watery bowel movements in the past 24 hours - PLAN: Recommend CAUTIOUS use of Imodium to maintain a difficult balance of bowel movement consistency.  Hard/formed bowel movements place patient at increased risk of rectal bleeding, but watery diarrhea places her at further risk of dehydration, hypovolemia, and deterioration.  Recommend Imodium only if >4 watery bowel movements daily.  Once patient is having formed bowel movements again, recommend use of Colace 100 mg 1-2 times daily to maintain soft bowel movements.  # Nausea - PLAN: Recommend scheduled Compazine 10 mg every 6 hours to "stay ahead" of nausea.  Patient instructed to call clinic if she has any breakthrough nausea, so that we can send Zofran if needed.  # Pelvic pain -  Intermittent pelvic pain, usually 3/10 to 5/10, but with some severe pelvic pain yesterday at 10/10 - PLAN: Prescription sent to pharmacy for hydrocodone 5 mg to be taken 1 tablet every 4-6 hours as needed for severe pain.  (Patient also takes Flexeril at home, advised that she should NOT take these medications at the same time, and to pay close attention for any symptoms of CNS or respiratory depression)  #  Nutrition and hydration - Overall, was doing well eating and drinking up until the past 48 to 72 hours - PLAN: Encourage patient to drink 64 ounces of water daily.  Encourage small frequent meals throughout the day in addition to 2-3 Boost or Ensure.  Continue follow-up with cancer center nutritionist.  PLAN SUMMARY: >> Daily CBC with possible blood transfusion on Thursday, Friday, and Monday next week. >> Reschedule for IV Venofer 400 mg x 3 >> Next scheduled appointment with main oncologist: Wednesday, 02/03/2023   REVIEW OF SYSTEMS:   Review of Systems  Constitutional:  Positive for activity change, appetite change, fatigue and unexpected weight change. Negative for chills, diaphoresis and fever.  HENT:  Negative for mouth sores, nosebleeds, sore throat and trouble swallowing.   Respiratory:  Negative for cough and shortness of breath.   Cardiovascular:  Positive for palpitations. Negative for chest pain and leg swelling.  Gastrointestinal:  Positive for blood in stool, diarrhea and nausea. Negative for abdominal pain, constipation and vomiting.  Genitourinary:  Negative for dysuria and hematuria.  Neurological:  Positive for dizziness. Negative for light-headedness, numbness and headaches.  Psychiatric/Behavioral:  Negative for dysphoric mood and sleep disturbance. The patient is not nervous/anxious.     Past Medical History, Surgical history, Social history, and  Family history were reviewed as documented elsewhere in chart, and were updated as appropriate.   OBJECTIVE:  Physical Exam:  There were no vitals taken for this visit. ECOG: 2  Physical Exam Constitutional:      Appearance: Normal appearance. She is morbidly obese.     Comments: Somewhat weak appearing on exam  Cardiovascular:     Heart sounds: Normal heart sounds.  Pulmonary:     Breath sounds: Normal breath sounds.  Skin:    Coloration: Skin is pale.  Neurological:     General: No focal deficit present.      Mental Status: Mental status is at baseline.  Psychiatric:        Behavior: Behavior normal. Behavior is cooperative.    Lab Review:     Component Value Date/Time   NA 136 01/20/2023 0918   NA 139 09/03/2022 1305   K 3.9 01/20/2023 0918   CL 105 01/20/2023 0918   CO2 23 01/20/2023 0918   GLUCOSE 136 (H) 01/20/2023 0918   BUN 10 01/20/2023 0918   BUN 8 09/03/2022 1305   CREATININE 0.81 01/20/2023 0918   CREATININE 0.81 12/14/2019 0848   CALCIUM 8.4 (L) 01/20/2023 0918   PROT 6.5 01/20/2023 0918   PROT 6.6 09/03/2022 1305   ALBUMIN 3.2 (L) 01/20/2023 0918   ALBUMIN 3.7 (L) 09/03/2022 1305   AST 36 01/20/2023 0918   ALT 13 01/20/2023 0918   ALKPHOS 49 01/20/2023 0918   BILITOT 0.6 01/20/2023 0918   BILITOT 0.3 09/03/2022 1305   GFRNONAA >60 01/20/2023 0918   GFRNONAA 78 12/14/2019 0848   GFRAA 81 12/11/2020 0908   GFRAA 90 12/14/2019 0848       Component Value Date/Time   WBC 6.3 01/20/2023 0918   RBC 3.57 (L) 01/20/2023 0918   HGB 7.4 (L) 01/20/2023 0918   HGB 8.6 (L) 11/26/2022 1343   HCT 28.0 (L) 01/20/2023 0918   HCT 30.5 (L) 11/26/2022 1343   PLT 226 01/20/2023 0918   PLT 199 11/26/2022 1343   MCV 78.4 (L) 01/20/2023 0918   MCV 76 (L) 11/26/2022 1343   MCH 20.7 (L) 01/20/2023 0918   MCHC 26.4 (L) 01/20/2023 0918   RDW 20.5 (H) 01/20/2023 0918   RDW 16.6 (H) 11/26/2022 1343   LYMPHSABS 1.3 01/20/2023 0918   MONOABS 0.6 01/20/2023 0918   EOSABS 0.2 01/20/2023 0918   BASOSABS 0.1 01/20/2023 0918   -------------------------------  Imaging from last 24 hours (if applicable): Radiology interpretation: IR IMAGING GUIDED PORT INSERTION  Result Date: 01/15/2023 INDICATION: Chemotherapy EXAM: Chest port placement using ultrasound and fluoroscopic guidance MEDICATIONS: Documented in the EMR ANESTHESIA/SEDATION: Moderate (conscious) sedation was employed during this procedure. A total of Versed 1.5 mg and Fentanyl 75 mcg was administered intravenously. Moderate  Sedation Time: 30 minutes. The patient'Manning level of consciousness and vital signs were monitored continuously by radiology nursing throughout the procedure under my direct supervision. FLUOROSCOPY TIME:  Fluoroscopy Time: 0.4 minutes (16 mGy) COMPLICATIONS: None immediate. PROCEDURE: Informed written consent was obtained from the patient after a thorough discussion of the procedural risks, benefits and alternatives. All questions were addressed. Maximal Sterile Barrier Technique was utilized including caps, mask, sterile gowns, sterile gloves, sterile drape, hand hygiene and skin antiseptic. A timeout was performed prior to the initiation of the procedure. The patient was placed supine on the exam table. The right neck and chest was prepped and draped in the standard sterile fashion. A preliminary ultrasound of the right neck was performed  and demonstrates a patent right internal jugular vein. A permanent ultrasound image was stored in the electronic medical record. The overlying skin was anesthetized with 1% Lidocaine. Using ultrasound guidance, access was obtained into the right internal jugular vein using a 21 gauge micropuncture set. A wire was advanced into the SVC, a short incision was made at the puncture site, and serial dilatation performed. Next, in an ipsilateral infraclavicular location, an incision was made at the site of the subcutaneous reservoir. Blunt dissection was used to open a pocket to contain the reservoir. A subcutaneous tunnel was then created from the port site to the puncture site. A(n) 8 Fr single lumen catheter was advanced through the tunnel. The catheter was attached to the port and this was placed in the subcutaneous pocket. Under fluoroscopic guidance, a peel away sheath was placed, and the catheter was trimmed to the appropriate length and was advanced into the central veins. The catheter length is 21 cm. The tip of the catheter lies near the superior cavoatrial junction. The port  flushes and aspirates appropriately. The port was flushed and locked with heparinized saline. The port pocket was closed in 2 layers using 3-0 and 4-0 Vicryl/absorbable suture. Dermabond was also applied to both incisions. The patient tolerated the procedure well and was transferred to recovery in stable condition. IMPRESSION: Successful placement of a right-sided chest port via the right internal jugular vein. The port is ready for immediate use. Electronically Signed   By: Albin Felling M.D.   On: 01/15/2023 14:53   NM PET Image Initial (PI) Skull Base To Thigh  Result Date: 01/01/2023 CLINICAL DATA:  Initial treatment strategy for rectal cancer. EXAM: NUCLEAR MEDICINE PET SKULL BASE TO THIGH TECHNIQUE: 14.6 mCi F-18 FDG was injected intravenously. Full-ring PET imaging was performed from the skull base to thigh after the radiotracer. CT data was obtained and used for attenuation correction and anatomic localization. Fasting blood glucose: 198 mg/dl COMPARISON:  Chest abdomen pelvis CT 12/17/2022 FINDINGS: Mediastinal blood pool activity: SUV max 3.2 Liver activity: SUV max NA NECK: No hypermetabolic lymph nodes in the neck. Incidental CT findings: None. CHEST: No hypermetabolic mediastinal or hilar nodes. No suspicious pulmonary nodules on the CT scan. 2.3 cm inferior right thyroid nodule noted image 68/3 without hypermetabolism. The 3 mm right middle lobe subpleural nodule identified on the previous CT scan is not evident on CT or PET imaging today. Incidental CT findings: Asymmetric elevation right hemidiaphragm. Atelectasis or scarring noted right base. ABDOMEN/PELVIS: No abnormal hypermetabolic activity within the liver, pancreas, adrenal glands, or spleen. No hypermetabolic lymph nodes in the abdomen or pelvis. Patient'Manning known rectal lesion is markedly hypermetabolic with SUV max = 40. No discernible hypermetabolic perirectal or pelvic lymphadenopathy. The subtle too small to characterize hypoattenuating  lesion seen in the hepatic dome on previous CT show no detectable hypermetabolism on PET imaging. There is hypermetabolism in the region of the gallbladder neck with SUV max = 4.9. This corresponds to the subtle 13 mm focus of increased attenuation in the gallbladder neck. While this could be a noncalcified stone, given the FDG accumulation in this region, right upper quadrant ultrasound recommended to further evaluate as gallbladder polyp/neoplasm could have this appearance. Incidental CT findings: As noted previously, liver morphology raises the question of cirrhosis. Diffusely decreased attenuation of liver parenchyma is compatible with fatty deposition. 2.7 cm exophytic posterior right interpolar lesion shows no substantial hypermetabolism with SUV max = 2.8. diverticular changes noted left colon without diverticulitis. SKELETON: No  focal hypermetabolic activity to suggest skeletal metastasis. Incidental CT findings: No worrisome lytic or sclerotic osseous abnormality. Skin thickening in superficial subcutaneous edema noted lower anterior abdominal wall, similar to prior. Cellulitis/panniculitis not excluded. IMPRESSION: 1. The patient'Manning known rectal lesion is markedly hypermetabolic. No evidence for hypermetabolic perirectal or pelvic lymphadenopathy. 2. No other evidence for hypermetabolic metastatic disease in the neck, chest, abdomen, or pelvis. 3. The subtle too small to characterize hypoattenuating lesions seen in the hepatic dome on previous CT show no detectable hypermetabolism on PET imaging. 4. Focal hypermetabolism in the region of the gallbladder neck. This corresponds to a subtle 13 mm focus of increased attenuation in the lumen of the gallbladder. While on CT imaging this could be a noncalcified stone, given the FDG accumulation in this region, gallbladder polyp/neoplasm is a concern. Right upper quadrant ultrasound recommended to further evaluate. 5. 2.7 cm exophytic posterior right interpolar  renal lesion shows no substantial hypermetabolism on PET imaging. Likely a cyst complicated by proteinaceous debris or hemorrhage, close attention on follow-up recommended. 6. 2.3 cm inferior right thyroid nodule without hypermetabolism on PET imaging. Recommend thyroid US (ref: J Am Coll Radiol. 2015 Feb;12(2): 143-50). 7. Similar skin thickening with superficial edema associated with the lower anterior abdominal wall. Electronically Signed   By: Misty Stanley M.D.   On: 01/01/2023 09:51      WRAP UP:  All questions were answered. The patient knows to call the clinic with any problems, questions or concerns.  Medical decision making: High  Time spent on visit: I spent 40 minutes counseling the patient face to face. The total time spent in the appointment was 60 minutes and more than 50% was on counseling.  Harriett Rush, PA-C  01/27/23 3:35 PM

## 2023-01-26 NOTE — Patient Instructions (Addendum)
Nutrition:  High-protein diet from a primarily plant-based diet. Avoid red meat.  No raw or undercooked meat, seafood, or shellfish. Low-fat/cholesterol/carbohydrate diet. Limit sodium to no more than 2000 mg/day including everything that you eat and drink. Recommend at least 30 minutes of aerobic and resistance exercise 3 days/week.  Increase your pantoprazole to 40 mg BID to help with your reflux.  This may also help you with your nausea.  Continue Zofran as needed for nausea.  You may continue to take Imodium as needed for your diarrhea especially if you have greater than 3 bowel movements per day.  Continue your daily oral iron as well as your B12 injections.  To help with intermittent diarrhea you may also benefit from a probiotic given your known chemotherapy.  Typically recommend align or [facility] colon health as good brands.   Since you see oncology tomorrow please mention your lack of appetite to them to see if appetite stimulant can be given.  If you begin having any worsening abdominal pain please call the office and let me know.  It was a pleasure to see you today. I want to create trusting relationships with patients. If you receive a survey regarding your visit,  I greatly appreciate you taking time to fill this out on paper or through your MyChart. I value your feedback.  Venetia Night, MSN, FNP-BC, AGACNP-BC [facility] \

## 2023-01-27 ENCOUNTER — Ambulatory Visit: Payer: Medicare HMO | Admitting: Physician Assistant

## 2023-01-27 ENCOUNTER — Encounter (HOSPITAL_COMMUNITY): Payer: Self-pay

## 2023-01-27 ENCOUNTER — Inpatient Hospital Stay: Payer: Medicare HMO

## 2023-01-27 ENCOUNTER — Encounter (INDEPENDENT_AMBULATORY_CARE_PROVIDER_SITE_OTHER): Payer: Self-pay | Admitting: Gastroenterology

## 2023-01-27 ENCOUNTER — Other Ambulatory Visit: Payer: Medicare HMO

## 2023-01-27 ENCOUNTER — Other Ambulatory Visit: Payer: Self-pay

## 2023-01-27 ENCOUNTER — Observation Stay (HOSPITAL_COMMUNITY)
Admission: EM | Admit: 2023-01-27 | Discharge: 2023-01-28 | Disposition: A | Discharge status: Home / Self Care | Payer: Medicare HMO | Source: Ambulatory Visit | Attending: Internal Medicine | Admitting: Internal Medicine

## 2023-01-27 ENCOUNTER — Inpatient Hospital Stay (HOSPITAL_BASED_OUTPATIENT_CLINIC_OR_DEPARTMENT_OTHER): Payer: Medicare HMO | Admitting: Physician Assistant

## 2023-01-27 ENCOUNTER — Other Ambulatory Visit: Payer: Self-pay | Admitting: Physician Assistant

## 2023-01-27 ENCOUNTER — Encounter: Payer: Self-pay | Admitting: Hematology

## 2023-01-27 VITALS — BP 123/75 | HR 95 | Temp 98.4°F | Resp 18

## 2023-01-27 DIAGNOSIS — C20 Malignant neoplasm of rectum: Secondary | ICD-10-CM | POA: Diagnosis present

## 2023-01-27 DIAGNOSIS — R195 Other fecal abnormalities: Secondary | ICD-10-CM

## 2023-01-27 DIAGNOSIS — E039 Hypothyroidism, unspecified: Secondary | ICD-10-CM | POA: Insufficient documentation

## 2023-01-27 DIAGNOSIS — E119 Type 2 diabetes mellitus without complications: Secondary | ICD-10-CM | POA: Diagnosis not present

## 2023-01-27 DIAGNOSIS — Z7984 Long term (current) use of oral hypoglycemic drugs: Secondary | ICD-10-CM | POA: Diagnosis not present

## 2023-01-27 DIAGNOSIS — K219 Gastro-esophageal reflux disease without esophagitis: Secondary | ICD-10-CM

## 2023-01-27 DIAGNOSIS — I2510 Atherosclerotic heart disease of native coronary artery without angina pectoris: Secondary | ICD-10-CM | POA: Diagnosis not present

## 2023-01-27 DIAGNOSIS — Z794 Long term (current) use of insulin: Secondary | ICD-10-CM | POA: Insufficient documentation

## 2023-01-27 DIAGNOSIS — K521 Toxic gastroenteritis and colitis: Secondary | ICD-10-CM | POA: Diagnosis not present

## 2023-01-27 DIAGNOSIS — D61810 Antineoplastic chemotherapy induced pancytopenia: Secondary | ICD-10-CM | POA: Insufficient documentation

## 2023-01-27 DIAGNOSIS — D62 Acute posthemorrhagic anemia: Principal | ICD-10-CM | POA: Diagnosis present

## 2023-01-27 DIAGNOSIS — G893 Neoplasm related pain (acute) (chronic): Secondary | ICD-10-CM | POA: Diagnosis not present

## 2023-01-27 DIAGNOSIS — K5909 Other constipation: Secondary | ICD-10-CM | POA: Diagnosis not present

## 2023-01-27 DIAGNOSIS — T451X5A Adverse effect of antineoplastic and immunosuppressive drugs, initial encounter: Secondary | ICD-10-CM

## 2023-01-27 DIAGNOSIS — I10 Essential (primary) hypertension: Secondary | ICD-10-CM | POA: Insufficient documentation

## 2023-01-27 DIAGNOSIS — D509 Iron deficiency anemia, unspecified: Secondary | ICD-10-CM

## 2023-01-27 DIAGNOSIS — K625 Hemorrhage of anus and rectum: Secondary | ICD-10-CM | POA: Diagnosis not present

## 2023-01-27 DIAGNOSIS — E118 Type 2 diabetes mellitus with unspecified complications: Secondary | ICD-10-CM

## 2023-01-27 DIAGNOSIS — Z85040 Personal history of malignant carcinoid tumor of rectum: Secondary | ICD-10-CM | POA: Insufficient documentation

## 2023-01-27 DIAGNOSIS — D61818 Other pancytopenia: Secondary | ICD-10-CM | POA: Diagnosis present

## 2023-01-27 LAB — COMPREHENSIVE METABOLIC PANEL
AG Ratio: 1.4 (calc) (ref 1.0–2.5)
ALT: 21 U/L (ref 6–29)
ALT: 23 U/L (ref 0–44)
AST: 40 U/L — ABNORMAL HIGH (ref 10–35)
AST: 44 U/L — ABNORMAL HIGH (ref 15–41)
Albumin: 3.6 g/dL (ref 3.6–5.1)
Albumin: 3.1 g/dL — ABNORMAL LOW (ref 3.5–5.0)
Alkaline Phosphatase: 43 U/L (ref 38–126)
Alkaline phosphatase (APISO): 48 U/L (ref 37–153)
Anion gap: 10 (ref 5–15)
BUN: 13 mg/dL (ref 7–25)
BUN: 14 mg/dL (ref 8–23)
CO2: 24 mmol/L (ref 20–32)
CO2: 21 mmol/L — ABNORMAL LOW (ref 22–32)
Calcium: 8.6 mg/dL (ref 8.6–10.4)
Calcium: 8.4 mg/dL — ABNORMAL LOW (ref 8.9–10.3)
Chloride: 104 mmol/L (ref 98–110)
Chloride: 104 mmol/L (ref 98–111)
Creat: 0.84 mg/dL (ref 0.50–1.05)
Creatinine, Ser: 0.9 mg/dL (ref 0.44–1.00)
GFR, Estimated: >60 mL/min (ref >60)
Globulin: 2.6 g/dL (calc) (ref 1.9–3.7)
Glucose, Bld: 156 mg/dL — ABNORMAL HIGH (ref 65–99)
Glucose, Bld: 199 mg/dL — ABNORMAL HIGH (ref 70–99)
Potassium: 4.5 mmol/L (ref 3.5–5.3)
Potassium: 4 mmol/L (ref 3.5–5.1)
Sodium: 136 mmol/L (ref 135–146)
Sodium: 135 mmol/L (ref 135–145)
Total Bilirubin: 0.8 mg/dL (ref 0.2–1.2)
Total Bilirubin: 0.9 mg/dL (ref 0.3–1.2)
Total Protein: 6.2 g/dL (ref 6.1–8.1)
Total Protein: 6 g/dL — ABNORMAL LOW (ref 6.5–8.1)

## 2023-01-27 LAB — CBC WITH DIFFERENTIAL/PLATELET
Abs Immature Granulocytes: 0.02 10*3/uL (ref 0.00–0.07)
Basophils Absolute: 0 10*3/uL (ref 0.0–0.1)
Basophils Relative: 0 %
Eosinophils Absolute: 0 10*3/uL (ref 0.0–0.5)
Eosinophils Relative: 2 %
HCT: 17.7 % — ABNORMAL LOW (ref 36.0–46.0)
Hemoglobin: 4.8 g/dL — CL (ref 12.0–15.0)
Immature Granulocytes: 2 %
Lymphocytes Relative: 43 %
Lymphs Abs: 0.6 10*3/uL — ABNORMAL LOW (ref 0.7–4.0)
MCH: 20.8 pg — ABNORMAL LOW (ref 26.0–34.0)
MCHC: 27.1 g/dL — ABNORMAL LOW (ref 30.0–36.0)
MCV: 76.6 fL — ABNORMAL LOW (ref 80.0–100.0)
Monocytes Absolute: 0 10*3/uL — ABNORMAL LOW (ref 0.1–1.0)
Monocytes Relative: 2 %
Neutro Abs: 0.7 10*3/uL — ABNORMAL LOW (ref 1.7–7.7)
Neutrophils Relative %: 51 %
Platelets: 115 10*3/uL — ABNORMAL LOW (ref 150–400)
RBC: 2.31 MIL/uL — ABNORMAL LOW (ref 3.87–5.11)
RDW: 18.9 % — ABNORMAL HIGH (ref 11.5–15.5)
WBC: 1.3 10*3/uL — CL (ref 4.0–10.5)
nRBC: 0 % (ref 0.0–0.2)

## 2023-01-27 LAB — GLUCOSE, CAPILLARY
Glucose-Capillary: 136 mg/dL — ABNORMAL HIGH (ref 70–99)
Glucose-Capillary: 125 mg/dL — ABNORMAL HIGH (ref 70–99)

## 2023-01-27 LAB — ABO/RH: ABO/RH(D): A POS

## 2023-01-27 LAB — CBC
HCT: 20.7 % — ABNORMAL LOW (ref 35.0–45.0)
HCT: 21.7 % — ABNORMAL LOW (ref 36.0–46.0)
Hemoglobin: 5.8 g/dL — CL (ref 11.7–15.5)
Hemoglobin: 6.4 g/dL — CL (ref 12.0–15.0)
MCH: 20.4 pg — ABNORMAL LOW (ref 27.0–33.0)
MCH: 22.9 pg — ABNORMAL LOW (ref 26.0–34.0)
MCHC: 28 g/dL — ABNORMAL LOW (ref 32.0–36.0)
MCHC: 29.5 g/dL — ABNORMAL LOW (ref 30.0–36.0)
MCV: 72.9 fL — ABNORMAL LOW (ref 80.0–100.0)
MCV: 77.5 fL — ABNORMAL LOW (ref 80.0–100.0)
MPV: 9.9 fL (ref 7.5–12.5)
Platelets: 209 10*3/uL (ref 140–400)
Platelets: 114 10*3/uL — ABNORMAL LOW (ref 150–400)
RBC: 2.84 10*6/uL — ABNORMAL LOW (ref 3.80–5.10)
RBC: 2.8 MIL/uL — ABNORMAL LOW (ref 3.87–5.11)
RDW: 18.1 % — ABNORMAL HIGH (ref 11.0–15.0)
RDW: 18.9 % — ABNORMAL HIGH (ref 11.5–15.5)
WBC: 3.9 10*3/uL (ref 3.8–10.8)
WBC: 1.1 10*3/uL — CL (ref 4.0–10.5)
nRBC: 0 % (ref 0.0–0.2)

## 2023-01-27 LAB — MRSA NEXT GEN BY PCR, NASAL: MRSA by PCR Next Gen: NOT DETECTED

## 2023-01-27 LAB — PREPARE RBC (CROSSMATCH)

## 2023-01-27 LAB — HEPATITIS B SURFACE ANTIGEN: Hepatitis B Surface Ag: NONREACTIVE

## 2023-01-27 LAB — MAGNESIUM: Magnesium: 1.8 mg/dL (ref 1.7–2.4)

## 2023-01-27 LAB — HEPATITIS A ANTIBODY, IGM: Hep A IgM: NONREACTIVE

## 2023-01-27 LAB — HEPATITIS C ANTIBODY: Hepatitis C Ab: NONREACTIVE

## 2023-01-27 MED ORDER — SODIUM CHLORIDE 0.9% FLUSH: 10.0000 mL | INTRAVENOUS | Status: DC | PRN

## 2023-01-27 MED ORDER — DOCUSATE SODIUM 100 MG PO CAPS
100.0000 mg | ORAL_CAPSULE | Freq: Two times a day (BID) | ORAL | Status: DC
  Administered 2023-01-28: 100 mg via ORAL
  Filled 2023-01-27: qty 1

## 2023-01-27 MED ORDER — LOPERAMIDE HCL 2 MG PO CAPS
4.0000 mg | ORAL_CAPSULE | Freq: Once | ORAL | Status: AC
  Administered 2023-01-27: 4 mg via ORAL
  Filled 2023-01-27: qty 2

## 2023-01-27 MED ORDER — ACETAMINOPHEN 325 MG PO TABS
650.0000 mg | ORAL_TABLET | Freq: Once | ORAL | Status: AC
  Administered 2023-01-27: 650 mg via ORAL
  Filled 2023-01-27: qty 2

## 2023-01-27 MED ORDER — LACTATED RINGERS IV SOLN: INTRAVENOUS | Status: DC

## 2023-01-27 MED ORDER — CHLORHEXIDINE GLUCONATE CLOTH 2 % EX PADS
6.0000 | MEDICATED_PAD | Freq: Every day | CUTANEOUS | Status: DC
  Administered 2023-01-27 – 2023-01-28 (×2): 6 via TOPICAL

## 2023-01-27 MED ORDER — SODIUM CHLORIDE 0.9% IV SOLUTION
250.0000 mL | Freq: Once | INTRAVENOUS | Status: AC
  Administered 2023-01-27: 250 mL via INTRAVENOUS

## 2023-01-27 MED ORDER — LOPERAMIDE HCL 2 MG PO CAPS: 2.0000 mg | ORAL_CAPSULE | ORAL | 0 refills | Status: DC | PRN

## 2023-01-27 MED ORDER — SODIUM CHLORIDE 0.9% IV SOLUTION: Freq: Once | INTRAVENOUS | Status: AC

## 2023-01-27 MED ORDER — SODIUM CHLORIDE 0.9% FLUSH
10.0000 mL | Freq: Once | INTRAVENOUS | Status: AC
  Administered 2023-01-27: 10 mL via INTRAVENOUS

## 2023-01-27 MED ORDER — ACETAMINOPHEN 325 MG PO TABS: 650.0000 mg | ORAL_TABLET | Freq: Four times a day (QID) | ORAL | Status: DC | PRN

## 2023-01-27 MED ORDER — DIPHENHYDRAMINE HCL 25 MG PO CAPS: 25.0000 mg | ORAL_CAPSULE | Freq: Once | ORAL | Status: DC

## 2023-01-27 MED ORDER — SODIUM CHLORIDE 0.9% IV SOLUTION: 250.0000 mL | Freq: Once | INTRAVENOUS | Status: DC

## 2023-01-27 MED ORDER — DIPHENHYDRAMINE HCL 25 MG PO CAPS
25.0000 mg | ORAL_CAPSULE | Freq: Once | ORAL | Status: AC
  Administered 2023-01-27: 25 mg via ORAL
  Filled 2023-01-27: qty 1

## 2023-01-27 MED ORDER — INSULIN ASPART 100 UNIT/ML IJ SOLN: 0.0000 [IU] | Freq: Every day | INTRAMUSCULAR | Status: DC

## 2023-01-27 MED ORDER — LEVOTHYROXINE SODIUM 88 MCG PO TABS
88.0000 ug | ORAL_TABLET | Freq: Every day | ORAL | Status: DC
  Administered 2023-01-28: 88 ug via ORAL
  Filled 2023-01-27: qty 1

## 2023-01-27 MED ORDER — ACETAMINOPHEN 325 MG PO TABS: 650.0000 mg | ORAL_TABLET | Freq: Once | ORAL | Status: DC

## 2023-01-27 MED ORDER — HEPARIN SOD (PORK) LOCK FLUSH 100 UNIT/ML IV SOLN
500.0000 [IU] | Freq: Once | INTRAVENOUS | Status: AC
  Administered 2023-01-28: 500 [IU] via INTRAVENOUS

## 2023-01-27 MED ORDER — HEPARIN SOD (PORK) LOCK FLUSH 100 UNIT/ML IV SOLN
500.0000 [IU] | Freq: Every day | INTRAVENOUS | Status: AC | PRN
  Administered 2023-01-27: 500 [IU]

## 2023-01-27 MED ORDER — FERROUS SULFATE 325 (65 FE) MG PO TBEC
325.0000 mg | DELAYED_RELEASE_TABLET | Freq: Every day | ORAL | Status: DC
  Administered 2023-01-28: 325 mg via ORAL
  Filled 2023-01-27 (×2): qty 1

## 2023-01-27 MED ORDER — HEPARIN SOD (PORK) LOCK FLUSH 100 UNIT/ML IV SOLN: 500.0000 [IU] | Freq: Every day | INTRAVENOUS | Status: DC | PRN

## 2023-01-27 MED ORDER — INSULIN ASPART 100 UNIT/ML IJ SOLN
0.0000 [IU] | Freq: Three times a day (TID) | INTRAMUSCULAR | Status: DC
  Administered 2023-01-28 (×2): 3 [IU] via SUBCUTANEOUS

## 2023-01-27 MED ORDER — HYDROCODONE-ACETAMINOPHEN 5-300 MG PO TABS: 1.0000 | ORAL_TABLET | Freq: Four times a day (QID) | ORAL | 0 refills | Status: DC | PRN

## 2023-01-27 MED ORDER — PANTOPRAZOLE SODIUM 40 MG PO TBEC
40.0000 mg | DELAYED_RELEASE_TABLET | Freq: Two times a day (BID) | ORAL | Status: DC
  Administered 2023-01-28: 40 mg via ORAL
  Filled 2023-01-27: qty 1

## 2023-01-27 MED ORDER — HYDROCODONE-ACETAMINOPHEN 5-325 MG PO TABS: 1.0000 | ORAL_TABLET | Freq: Four times a day (QID) | ORAL | 0 refills | Status: DC | PRN

## 2023-01-27 MED ORDER — ONDANSETRON HCL 4 MG/2ML IJ SOLN
4.0000 mg | Freq: Four times a day (QID) | INTRAMUSCULAR | Status: DC | PRN
  Administered 2023-01-28: 4 mg via INTRAVENOUS
  Filled 2023-01-27: qty 2

## 2023-01-27 MED ORDER — ONDANSETRON HCL 4 MG PO TABS: 4.0000 mg | ORAL_TABLET | Freq: Four times a day (QID) | ORAL | Status: DC | PRN

## 2023-01-27 MED ORDER — ACETAMINOPHEN 650 MG RE SUPP: 650.0000 mg | Freq: Four times a day (QID) | RECTAL | Status: DC | PRN

## 2023-01-27 MED ORDER — SODIUM CHLORIDE 0.9% FLUSH
10.0000 mL | INTRAVENOUS | Status: AC | PRN
  Administered 2023-01-27: 10 mL

## 2023-01-27 MED ORDER — DOCUSATE SODIUM 100 MG PO CAPS: 100.0000 mg | ORAL_CAPSULE | Freq: Two times a day (BID) | ORAL | 0 refills | Status: DC

## 2023-01-27 NOTE — Assessment & Plan Note (Signed)
Hemoglobin 4.8, likely more from rectal bleeding, leukopenia 1.3, platelets 115.  Afebrile.  Signs or symptoms to suggest infection at this time.   -Oncology note today, no G-CSF needed unless she becomes febrile.

## 2023-01-27 NOTE — H&P (Addendum)
History and Physical    [name] [id number] DOB: 1956-05-19 DOA: 01/27/2023  PCP: Nickola Major, MD   Patient coming from: Home  I have personally briefly reviewed patient's old medical records in [place]  Chief Complaint: Rectal Bleeding  HPI: [name] is a 67 y.o. female with medical history significant for rectal cancer, diabetes mellitus, depression, hypertension. Patient presented to the ED with reports of rectal bleeding intermittent for a long time, but this bout started Sunday 3/3.  She has had been bleeding daily since onset.  Yesterday she had the worst episode, with large amount of bright red blood, with blood clots.  She was constipated prior to onset of this bleeding, and this might have provoked the bleeding.  No vomiting, no black stools.  She reports lower abdominal pain.  No urinary symptoms.  Very rarely uses NSAIDs. For the constipation she subsequently did have been loose bowel movements, ~3 times daily.  Patient went to see her oncologist today, blood work reviewed hemoglobin of 4.8, blood transfusion was started with a plan to discharge from the office, vitals are stable, but later patient started complaining of lightheadedness/dizziness so hospitalization (direct admission from clinic)  was requested.   She denies chest pain, no difficulty breathing.  At this time dizziness has improved.  She has subsequently had 2 soft bowel movements that were without blood.  Review of Systems: As per HPI all other systems reviewed and negative.  Past Medical History:  Diagnosis Date   Anxiety    Coronary atherosclerosis    Cardiac CT 09/2018 with calcium score 8.7 and mild proximal LAD disease   Essential hypertension 2009   GERD (gastroesophageal reflux disease)    Hypothyroidism    Iron deficiency anemia    Osteoarthritis    Palpitations    Tachypalpitations on beta blockers since 2010   Type 2 diabetes mellitus ([place]) 2003   Vitamin B 12 deficiency     Vitamin D deficiency     Past Surgical History:  Procedure Laterality Date   ABDOMINAL HYSTERECTOMY     History of cervical cancer   BIOPSY  12/16/2022   Procedure: BIOPSY;  Surgeon: Daneil Dolin, MD;  [facility];  Service: Endoscopy;;  gastric, rectal   BREAST BIOPSY Right    COLONOSCOPY WITH PROPOFOL N/A 12/16/2022   Procedure: COLONOSCOPY WITH PROPOFOL;  Surgeon: Daneil Dolin, MD;  [facility];  Service: Endoscopy;  Laterality: N/A;  10:45 am   ESOPHAGOGASTRODUODENOSCOPY (EGD) WITH PROPOFOL N/A 12/16/2022   Procedure: ESOPHAGOGASTRODUODENOSCOPY (EGD) WITH PROPOFOL;  Surgeon: Daneil Dolin, MD;  [facility];  Service: Endoscopy;  Laterality: N/A;   IR IMAGING GUIDED PORT INSERTION  01/15/2023   MALONEY DILATION N/A 12/16/2022   Procedure: Venia Minks DILATION;  Surgeon: Daneil Dolin, MD;  [facility];  Service: Endoscopy;  Laterality: N/A;   POLYPECTOMY  12/16/2022   Procedure: POLYPECTOMY;  Surgeon: Daneil Dolin, MD;  [facility];  Service: Endoscopy;;   TONSILLECTOMY       reports that she has never smoked. She has never used smokeless tobacco. She reports that she does not drink alcohol and does not use drugs.  Allergies  Allergen Reactions   Contrast Media [Iodinated Contrast Media] Other (See Comments)    Burning   Sulfa Antibiotics Nausea And Vomiting    Other reaction(s): GI Upset (intolerance) unknown   Doxepin Rash and Swelling   Tape Rash  Family History  Problem Relation Age of Onset   Heart failure Mother    Diabetes Father    Hypertension Father    Stroke Sister 65   Diabetes Brother    Cancer - Colon Other        age 72   Colon polyps Neg Hx     Prior to Admission medications   Medication Sig Start Date End Date Taking? Authorizing Provider  blood glucose meter kit and supplies KIT Dispense based on patient and insurance preference. Use up to four times daily as directed. (FOR  ICD-10 E11.65) 08/20/17   Cassandria Anger, MD  Cholecalciferol (VITAMIN D3) 5000 units TABS Take 5,000 Units by mouth daily.    [provider]  cyanocobalamin (VITAMIN B12) 1000 MCG/ML injection B12 shots at a frequency of once weekly for 4 weeks then can drop down to once monthly thereafter. 10/22/22   [provider]  cyclobenzaprine (FLEXERIL) 10 MG tablet Take 10 mg by mouth 3 (three) times daily as needed for muscle spasms. 01/08/22   [provider]  dextrose 5 % SOLN 1,000 mL with fluorouracil 5 GM/100ML SOLN Inject into the vein over 48 hr. Every 14 days 01/20/23   [provider]  docusate sodium (COLACE) 100 MG capsule Take 1 capsule (100 mg total) by mouth 2 (two) times daily. Do NOT take this if you have diarrhea.  Try to maintain soft easy to pass bowel movements. 01/27/23   Harriett Rush, PA-C  DROPLET PEN NEEDLES 31G X 5 MM MISC USE AS INSTRUCTED TO INJECT INSULIN DAILY 05/18/22   Brita Romp, NP  ferrous sulfate 325 (65 FE) MG EC tablet Take 325 mg by mouth daily.    [provider]  fluconazole (DIFLUCAN) 150 MG tablet Take by mouth. 01/20/23   [provider]  FLUOROURACIL IV Inject into the vein every 14 (fourteen) days. 01/20/23   [provider]  FLUoxetine (PROZAC) 20 MG capsule Take 20 mg by mouth daily.    [provider]  folic acid (FOLVITE) 1 MG tablet Take 1 tablet (1 mg total) by mouth daily. 01/20/23   Derek Jack, MD  HYDROcodone-acetaminophen (NORCO) 5-325 MG tablet Take 1 tablet by mouth every 6 (six) hours as needed for severe pain. 01/27/23   Harriett Rush, PA-C  insulin aspart (NOVOLOG FLEXPEN) 100 UNIT/ML FlexPen INJECT 8 TO 14 UNITS UNDER THE SKIN THREE TIMES DAILY WITH MEALS 11/10/22   Brita Romp, NP  insulin glargine (LANTUS SOLOSTAR) 100 UNIT/ML Solostar Pen Inject 60 Units into the skin at bedtime. 08/11/22   Brita Romp, NP  IRINOTECAN HCL IV  Inject into the vein every 14 (fourteen) days. 01/20/23   [provider]  LEUCOVORIN CALCIUM IV Inject into the vein every 14 (fourteen) days. 01/20/23   [provider]  levothyroxine (SYNTHROID) 88 MCG tablet TAKE 1 TABLET EVERY DAY BEFORE BREAKFAST 11/10/22   Brita Romp, NP  lidocaine-prilocaine (EMLA) cream Apply a quarter sized amount to port a cath site and cover with plastic wrap one hour prior to infusion appointments 01/13/23   Derek Jack, MD  lidocaine-prilocaine (EMLA) cream Apply to affected area once 01/19/23   Derek Jack, MD  loperamide (IMODIUM) 2 MG capsule Take 1 capsule (2 mg total) by mouth as needed for diarrhea or loose stools. You should ONLY take this if you are having more than 4 watery bowel movements in a day. 01/27/23   Harriett Rush,  PA-C  losartan (COZAAR) 100 MG tablet Take 100 mg by mouth daily. 09/21/14   [provider]  magnesium oxide (MAG-OX) 400 MG tablet Take 400 mg by mouth at bedtime.    [provider]  metFORMIN (GLUCOPHAGE) 1000 MG tablet Take 1 tablet (1,000 mg total) by mouth 2 (two) times daily. 09/01/22   Brita Romp, NP  nystatin cream (MYCOSTATIN) Apply twice per day to rash 01/20/23   [provider]  OXALIPLATIN IV Inject into the vein every 14 (fourteen) days. 01/20/23   [provider]  pantoprazole (PROTONIX) 40 MG tablet Take 1 tablet (40 mg total) by mouth 2 (two) times daily before a meal. 01/26/23   Mahon, Lenise Arena, NP  pregabalin (LYRICA) 100 MG capsule Take 1 capsule (100 mg total) by mouth 2 (two) times daily. 12/24/22   Brita Romp, NP  prochlorperazine (COMPAZINE) 10 MG tablet Take 1 tablet (10 mg total) by mouth every 6 (six) hours as needed for nausea or vomiting. 01/13/23   Derek Jack, MD  tirzepatide [facility]) 10 MG/0.5ML Pen Inject 10 mg into the skin once a week. 08/11/22   Brita Romp, NP  TRUE METRIX BLOOD GLUCOSE TEST  test strip TEST BLOOD SUGAR TWICE DAILY 11/10/22   Cassandria Anger, MD    Physical Exam:  Constitutional: NAD, calm, comfortable Eyes: PERRL, lids and conjunctivae normal ENMT: Mucous membranes are moist.   Neck: normal, supple, no masses, no thyromegaly Respiratory: clear to auscultation bilaterally, no wheezing, no crackles. Normal respiratory effort. No accessory muscle use.  Cardiovascular: Regular rate and rhythm, no murmurs / rubs / gallops. No extremity edema.  Extremities warm. Abdomen: no tenderness, no masses palpated. No hepatosplenomegaly. Bowel sounds positive.  Musculoskeletal: no clubbing / cyanosis. No joint deformity upper and lower extremities.  Skin: no rashes, lesions, ulcers. No induration Neurologic: No apparent cranial nerve abnormality moving extremities spontaneously Psychiatric: Normal judgment and insight. Alert and oriented x 3. Normal mood.   Labs on Admission: I have personally reviewed following labs and imaging studies  CBC: Recent Labs  Lab 01/26/23 1503 01/27/23 1037  WBC 3.9 1.3*  NEUTROABS  --  0.7*  HGB 5.8* 4.8*  HCT 20.7* 17.7*  MCV 72.9* 76.6*  PLT 209 [id number]*   Basic Metabolic Panel: Recent Labs  Lab 01/26/23 1503 01/27/23 1037  NA 136 135  K 4.5 4.0  CL 104 104  CO2 24 21*  GLUCOSE 156* 199*  BUN 13 14  CREATININE 0.84 0.90  CALCIUM 8.6 8.4*  MG  --  1.8   GFR: Estimated Creatinine Clearance: 84.4 mL/min (by C-G formula based on SCr of 0.9 mg/dL). Liver Function Tests: Recent Labs  Lab 01/26/23 1503 01/27/23 1037  AST 40* 44*  ALT 21 23  ALKPHOS  --  43  BILITOT 0.8 0.9  PROT 6.2 6.0*  ALBUMIN  --  3.1*   Radiological Exams on Admission: No results found.  EKG: None    Assessment/Plan Principal Problem:   Acute blood loss anemia Active Problems:   Rectal bleeding   Pancytopenia (HCC)   Rectal cancer (HCC)   Type 2 diabetes mellitus with complication, with long-term current use of insulin (HCC)    Hypertension   Assessment and Plan: * Acute blood loss anemia Hemoglobin down to 4.8, due to multiple episodes of rectal bleeding.  Has subsquently had 2 bowel movements today without blood.  Symptomatic with dizziness.  Vitals stable.  Not On anticoagulation.  No melena,  no hematemesis.  No significant NSAID use. - Completed 2 units of blood in clinic, , add a 3rd unit- hemoglobin 6.4. - Trend hemoglobin goal hemoglobin greater than 7 -Aim for soft stools -Continue home Protonix 40 twice daily - Resume home iron   Pancytopenia (HCC) Hemoglobin 4.8, likely more from rectal bleeding, leukopenia 1.3, platelets 115.  Afebrile.  Signs or symptoms to suggest infection at this time.   -Oncology note today, no G-CSF needed unless she becomes febrile.  Rectal cancer [facility]) Follows with Dr. Delton Coombes.  Stage IIc rectal cancer.  Currently on chemotherapy/immunotherapy/immunotherapy.  Last treatments 01/20/2023.  Hypertension Stable. -Hold losartan in the setting of rectal bleeding  Type 2 diabetes mellitus with complication, with long-term current use of insulin (HCC) Controlled, A1c 6.6. - SSI- M -Hold metformin -She states she takes only sliding scale insulin now, will hold home insulin -Med reconciliation pending   DVT prophylaxis: SCDS Code Status: Full Code Family Communication: None at bedside Disposition Plan: ~ 1- 2 days Consults called: None Admission status:  Obs Stepdown   Author: Bethena Roys, MD 01/27/2023 7:41 PM  For on call review [email or web address].

## 2023-01-27 NOTE — Progress Notes (Signed)
Based on initial assessment of patient this morning and discussion with Dr. Delton Coombes, we had opted to try outpatient management of her anemia.  However, she has become increasingly symptomatic throughout the afternoon and reports increased lightheadedness, weakness, and feeling like she "is going to pass out at any second."  She has had some recurrent diarrhea x 2 this afternoon, but denies any blood in her bowel movements.  She remains hemodynamically stable.  Given the above change in her symptoms in the setting of acute blood loss anemia, we will recommend direct admission to hospital for ongoing transfusions and closer monitoring of her condition.    Report given to Dr. Jenetta Downer (hospitalist) 4:50 PM, who accepts the patient to stepdown unit.  01/27/23 4:50 PM

## 2023-01-27 NOTE — Patient Instructions (Addendum)
[place] [facility] [place] **   You were seen today by Tarri Abernethy PA-C for your chemotherapy follow-up and symptom management visit.    ANEMIA You were given 2 blood transfusions today. We will recheck your blood and give possible blood transfusion tomorrow (Thursday 3/7), Friday (3/8), and Monday (3/11). If you have any recurrent severe rectal bleeding or have any symptoms of chest pain, shortness of breath, passing out, or altered mental status, you should call 911 and go immediately to the emergency department. We will schedule you for IV iron within the next few weeks.  LOW WHITE BLOOD CELLS Your low white blood cells are a side effect of your chemotherapy. Make sure that you are avoiding anyone with obvious illness.  Wash your hands frequently and thoroughly wash any fresh fruits or vegetables.  Avoid any raw or undercooked meats and eggs. Call our office IMMEDIATELY if you have any fever or other symptoms of infection.  DIARRHEA You can take Imodium as needed, but only for severe diarrhea (>4 watery bowel movements in 24 hours). Although diarrhea can lead to dehydration and other issues, hard bowel movements from constipation would likely cause you to start bleeding again. Once your stool is starting to become formed and "normal" again, you should take Colace 100 mg 1-2 times daily.  The goal is for your bowel movements to be soft and easy to pass.  You should STOP taking Colace if you start to have diarrhea again.  NAUSEA Take Compazine (prochlorperazine) 10 mg every 6 hours to "stay ahead" of your nausea. If you continue to have nausea despite Compazine, please let us know so that we can order additional nausea medications for you.  PELVIC PAIN Prescription has been sent to your pharmacy for hydrocodone 5 mg.  You can take 1 tablet every 4-6 hours as needed for severe pain.  Do not take this medication at the  same time as your Flexeril and watch for any symptoms of decreased breathing or alertness.  DEHYDRATION: It is very important that you remain hydrated while you are receiving treatment!   Make sure that you are drinking at least 64 ounces of water each day.  If you are unable to drink enough water, or are losing fluids through vomiting or diarrhea, please [place]. If you are severely dehydrated, you may have symptoms such as low blood pressure, increased weakness, lightheadedness with standing, passing out, or feeling that your heartbeat is racing.  If the symptoms occur, please [place].  If the symptoms are severe or occur outside of business hours, proceed to the emergency department.   NUTRITION & WEIGHT LOSS: Follow recommendations as given by dietitian. Try to eat small frequent meals throughout the day.  Drink Boost or Ensure 2 to 3 times daily, especially on the days when you are not able to eat much solid food.  ** If you have any new or worsening symptoms throughout the duration of your cancer treatment, please do not hesitate to call the [facility] at [phone].  Our triage nurse can assist with many common problems, and same-day symptom management visits are available with Tarri Abernethy PA-C as needed.  FOLLOW-UP APPOINTMENT: Office visit with Dr. Delton Coombes on Wednesday 02/03/2023.  You will also have labs and start cycle #2 of chemotherapy at that time.  ** Thank you for trusting me with your healthcare!  I strive to provide all of my patients  with quality care at each visit.  If you receive a survey for this visit, I would be so grateful to you for taking the time to provide feedback.  Thank you in advance!  ~ Everli Rother                   Dr. Derek Jack   &   Tarri Abernethy, PA-C   - - - - - - - - - - - - - - - - - -    Thank you for choosing [place] at [facility] to provide your oncology and hematology  care.  To afford each patient quality time with our provider, please arrive at least 15 minutes before your scheduled appointment time.   If you have a lab appointment with the [facility] please come in thru the Main Entrance and check in at the main information desk.  You need to re-schedule your appointment should you arrive 10 or more minutes late.  We strive to give you quality time with our providers, and arriving late affects you and other patients whose appointments are after yours.  Also, if you no show three or more times for appointments you may be dismissed from the clinic at the providers discretion.     Again, thank you for choosing [facility].  Our hope is that these requests will decrease the amount of time that you wait before being seen by our physicians.       _____________________________________________________________  Should you have questions after your visit to [facility], please contact our office at [phone] and follow the prompts.  Our office hours are 8:00 a.m. and 4:30 p.m. Monday - Friday.  Please note that voicemails left after 4:00 p.m. may not be returned until the following business day.  We are closed weekends and major holidays.  You do have access to a nurse 24-7, just call the main number to the clinic [phone] and do not press any options, hold on the line and a nurse will answer the phone.    For prescription refill requests, have your pharmacy contact our office and allow 72 hours.

## 2023-01-27 NOTE — Progress Notes (Signed)
CRITICAL VALUE ALERT Critical value received:  HGB 4.8 Date of notification:  01-27-2023 Time of notification: 1139 Critical value read back:  Yes.   Nurse who received alert:  B.Abbrielle Batts Rn.  MD notified time and response:  R.Pennington PA @ 1210 . Patient to receive 2 units of blood today.   Patient presents today for 2 units of blood per R.Pennington PA and C.Mahon NP. Patient requesting medication for diarrhea while here. Message sent to R. Pennington PA and orders received to give 4 mg of Imodium x 1 dose now. Venofer 400 mg iron infusion canceled today due to HGB 5.8 on 01-26-2023

## 2023-01-27 NOTE — Progress Notes (Signed)
Patient presents today for two units of PRBC per the request of Venetia Night, NP (GI).  We will hold Venofer 400 mg that he was originally scheduled for and give transfusions as this is a more pressing issue per Tarri Abernethy, PA-C.  Patient is not feeling well today and c/o fatigue, nausea, dirrahea, and dizziness.  Hemoglobin yesterday was 5.8.  Today hemoglobin is 4.8.  Patient evaluated by Tarri Abernethy, PA-C during her office visit.  Patient denies chest pain or SOB.  We will proceed with giving transfusions in the clinic per [place].    1545:  Patient started to have diarrhea again and feeling faint while transporting to the restroom.  Patient is very pale and overall feels worse than this morning.  Diarrhea had subsided for several hours after Imodium 4 mg this morning.  Tarri Abernethy, PA-C made aware.  We will obtain orthostatics per [place].

## 2023-01-27 NOTE — Assessment & Plan Note (Signed)
Follows with Dr. Delton Coombes.  Stage IIc rectal cancer.  Currently on chemotherapy/immunotherapy/immunotherapy.  Last treatments 01/20/2023.

## 2023-01-27 NOTE — Assessment & Plan Note (Addendum)
Stable. -Hold losartan in the setting of rectal bleeding

## 2023-01-27 NOTE — Assessment & Plan Note (Addendum)
Hemoglobin down to 4.8, due to multiple episodes of rectal bleeding.  Has subsquently had 2 bowel movements today without blood.  Symptomatic with dizziness.  Vitals stable.  Not On anticoagulation.  No melena, no hematemesis.  No significant NSAID use. - Completed 2 units of blood in clinic, , add a 3rd unit- hemoglobin 6.4. - Trend hemoglobin goal hemoglobin greater than 7 -Aim for soft stools -Continue home Protonix 40 twice daily - Resume home iron

## 2023-01-27 NOTE — Addendum Note (Signed)
Addended by: Tarri Abernethy on: 01/27/2023 04:19 PM   Modules accepted: Orders

## 2023-01-27 NOTE — Assessment & Plan Note (Addendum)
Controlled, A1c 6.6. - SSI- M -Hold metformin -She states she takes only sliding scale insulin now, will hold home insulin -Med reconciliation pending

## 2023-01-28 ENCOUNTER — Inpatient Hospital Stay: Payer: Medicare HMO

## 2023-01-28 ENCOUNTER — Other Ambulatory Visit: Payer: Self-pay

## 2023-01-28 ENCOUNTER — Inpatient Hospital Stay: Payer: Medicare HMO | Admitting: Dietician

## 2023-01-28 DIAGNOSIS — D62 Acute posthemorrhagic anemia: Secondary | ICD-10-CM

## 2023-01-28 DIAGNOSIS — K625 Hemorrhage of anus and rectum: Secondary | ICD-10-CM | POA: Diagnosis not present

## 2023-01-28 LAB — BASIC METABOLIC PANEL
Anion gap: 7 (ref 5–15)
BUN: 14 mg/dL (ref 8–23)
CO2: 22 mmol/L (ref 22–32)
Calcium: 7.9 mg/dL — ABNORMAL LOW (ref 8.9–10.3)
Chloride: 105 mmol/L (ref 98–111)
Creatinine, Ser: 0.85 mg/dL (ref 0.44–1.00)
GFR, Estimated: >60 mL/min (ref >60)
Glucose, Bld: 139 mg/dL — ABNORMAL HIGH (ref 70–99)
Potassium: 3.8 mmol/L (ref 3.5–5.1)
Sodium: 134 mmol/L — ABNORMAL LOW (ref 135–145)

## 2023-01-28 LAB — PREPARE RBC (CROSSMATCH)

## 2023-01-28 LAB — CBC
HCT: 22.6 % — ABNORMAL LOW (ref 36.0–46.0)
Hemoglobin: 6.8 g/dL — CL (ref 12.0–15.0)
MCH: 23.8 pg — ABNORMAL LOW (ref 26.0–34.0)
MCHC: 30.1 g/dL (ref 30.0–36.0)
MCV: 79 fL — ABNORMAL LOW (ref 80.0–100.0)
Platelets: 99 10*3/uL — ABNORMAL LOW (ref 150–400)
RBC: 2.86 MIL/uL — ABNORMAL LOW (ref 3.87–5.11)
RDW: 18.9 % — ABNORMAL HIGH (ref 11.5–15.5)
WBC: 0.8 10*3/uL — CL (ref 4.0–10.5)
nRBC: 0 % (ref 0.0–0.2)

## 2023-01-28 LAB — HIV ANTIBODY (ROUTINE TESTING W REFLEX): HIV Screen 4th Generation wRfx: NONREACTIVE

## 2023-01-28 LAB — HEMOGLOBIN AND HEMATOCRIT, BLOOD
HCT: 26.2 % — ABNORMAL LOW (ref 36.0–46.0)
Hemoglobin: 8.1 g/dL — ABNORMAL LOW (ref 12.0–15.0)

## 2023-01-28 LAB — GLUCOSE, CAPILLARY
Glucose-Capillary: 162 mg/dL — ABNORMAL HIGH (ref 70–99)
Glucose-Capillary: 158 mg/dL — ABNORMAL HIGH (ref 70–99)
Glucose-Capillary: 163 mg/dL — ABNORMAL HIGH (ref 70–99)

## 2023-01-28 MED ORDER — LOPERAMIDE HCL 2 MG PO CAPS
2.0000 mg | ORAL_CAPSULE | ORAL | Status: DC | PRN
  Administered 2023-01-28: 2 mg via ORAL
  Filled 2023-01-28: qty 1

## 2023-01-28 MED ORDER — MORPHINE SULFATE (PF) 2 MG/ML IV SOLN
2.0000 mg | INTRAVENOUS | Status: DC | PRN
  Administered 2023-01-28: 2 mg via INTRAVENOUS
  Filled 2023-01-28: qty 1

## 2023-01-28 MED ORDER — HEPARIN SOD (PORK) LOCK FLUSH 100 UNIT/ML IV SOLN: 500.0000 [IU] | Freq: Once | INTRAVENOUS | Status: DC

## 2023-01-28 MED ORDER — HEPARIN SOD (PORK) LOCK FLUSH 100 UNIT/ML IV SOLN
INTRAVENOUS | Status: AC
  Filled 2023-01-28: qty 5

## 2023-01-28 MED ORDER — SODIUM CHLORIDE 0.9% IV SOLUTION: Freq: Once | INTRAVENOUS | Status: AC

## 2023-01-28 MED ORDER — LOPERAMIDE HCL 2 MG PO CAPS: 2.0000 mg | ORAL_CAPSULE | ORAL | 0 refills | Status: DC | PRN

## 2023-01-28 NOTE — Progress Notes (Signed)
  Transition of Care (TOC) Screening Note   Patient Details  Name: [name] Date of Birth: [birth year]   Transition of Care [facility]) CM/SW Contact:    Iona Beard, [place] Phone Number: 01/28/2023, 11:05 AM    Transition of Care Department [facility]) has reviewed patient and no TOC needs have been identified at this time. We will continue to monitor patient advancement through interdisciplinary progression rounds. If new patient transition needs arise, please place a TOC consult.

## 2023-01-28 NOTE — Discharge Summary (Signed)
Physician Discharge Summary  [name] [id number] DOB: 1956-05-10 DOA: 01/27/2023  PCP: Nickola Major, MD  Admit date: 01/27/2023  Discharge date: 01/28/2023  Admitted From: Outpatient oncology office  Disposition: Home  Recommendations for Outpatient Follow-up:  Follow up with PCP in 1-2 weeks Please obtain BMP/CBC in one week Will follow up with heme-onc Delton Coombes) for rectal bleeding, message sent  Home Health: None  Equipment/Devices: None  Discharge Condition: Stable  CODE STATUS: Full  Diet recommendation: Heart healthy/carb modified  Brief/Interim Summary: [name] is a 67 y.o. female with medical history significant for rectal cancer on chemotherapy, diabetes mellitus, depression, and hypertension.  Patient presented to the ED on 3/6 with hemoglobin of 4.8 found at oncology appointment and with recurrent rectal bleeding (current episode started Sunday 3/3 and had been constant since).  Patient went to see her oncologist where anemia was noted and blood transfusion was started.  Patient started complaining of lightheadedness/dizziness and hospitalization (direct admission from clinic) was requested.  Patient had been constipated until bleeding started and then began having loose stools.  Did not have melena, just some abdominal pain.  Denied chest pain, had no difficulty breathing.  Received 2 more PRBC (total of 3) on 3/6 and on the morning of 3/7 was still anemic with hemoglobin of 6.8.  Received another unit of PRBC and orthostatic vitals were obtained showing no signs of orthostasis.  Follow up hemoglobin was 8.1.  Patient had multiple soft BMs and was placed on imodium, then was discharged in stable condition.  Discharge Diagnoses:  Principal Problem:   Acute blood loss anemia Active Problems:   Type 2 diabetes mellitus with complication, with long-term current use of insulin (HCC)   Hypertension   Rectal cancer (HCC)   Rectal bleeding   Pancytopenia  (HCC)  Hospitalized with acute anemia following multiple bloody bowel movements in the setting of rectal cancer actively treated with chemotherapy.  Discharge Instructions  Discharge Instructions     Diet - low sodium heart healthy   Complete by: As directed    Increase activity slowly   Complete by: As directed       Allergies as of 01/28/2023       Reactions   Contrast Media [iodinated Contrast Media] Other (See Comments)   Burning   Sulfa Antibiotics Nausea And Vomiting   Other reaction(s): GI Upset (intolerance) unknown   Doxepin Rash, Swelling   Tape Rash        Medication List     TAKE these medications    blood glucose meter kit and supplies Kit Dispense based on patient and insurance preference. Use up to four times daily as directed. (FOR ICD-10 E11.65)   cyanocobalamin 1000 MCG/ML injection Commonly known as: VITAMIN B12 B12 shots at a frequency of once weekly for 4 weeks then can drop down to once monthly thereafter.   cyclobenzaprine 10 MG tablet Commonly known as: FLEXERIL Take 10 mg by mouth 3 (three) times daily as needed for muscle spasms.   dextrose 5 % SOLN 1,000 mL with fluorouracil 5 GM/100ML SOLN Inject into the vein over 48 hr. Every 14 days   docusate sodium 100 MG capsule Commonly known as: Colace Take 1 capsule (100 mg total) by mouth 2 (two) times daily. Do NOT take this if you have diarrhea.  Try to maintain soft easy to pass bowel movements.   Droplet Pen Needles 31G X 5 MM Misc Generic drug: Insulin Pen Needle USE AS INSTRUCTED TO  INJECT INSULIN DAILY   ferrous sulfate 325 (65 FE) MG EC tablet Take 325 mg by mouth daily.   fluconazole 150 MG tablet Commonly known as: DIFLUCAN Take by mouth.   FLUOROURACIL IV Inject into the vein every 14 (fourteen) days.   FLUoxetine 20 MG capsule Commonly known as: PROZAC Take 20 mg by mouth daily.   folic acid 1 MG tablet Commonly known as: FOLVITE Take 1 tablet (1 mg total) by mouth  daily.   HYDROcodone-acetaminophen 5-325 MG tablet Commonly known as: Norco Take 1 tablet by mouth every 6 (six) hours as needed for severe pain.   IRINOTECAN HCL IV Inject into the vein every 14 (fourteen) days.   Lantus SoloStar 100 UNIT/ML Solostar Pen Generic drug: insulin glargine Inject 60 Units into the skin at bedtime.   LEUCOVORIN CALCIUM IV Inject into the vein every 14 (fourteen) days.   levothyroxine 88 MCG tablet Commonly known as: SYNTHROID TAKE 1 TABLET EVERY DAY BEFORE BREAKFAST   lidocaine-prilocaine cream Commonly known as: EMLA Apply a quarter sized amount to port a cath site and cover with plastic wrap one hour prior to infusion appointments   lidocaine-prilocaine cream Commonly known as: EMLA Apply to affected area once   loperamide 2 MG capsule Commonly known as: IMODIUM Take 1 capsule (2 mg total) by mouth as needed for diarrhea or loose stools. You should ONLY take this if you are having more than 4 watery bowel movements in a day. What changed: Another medication with the same name was added. Make sure you understand how and when to take each.   loperamide 2 MG capsule Commonly known as: IMODIUM Take 1 capsule (2 mg total) by mouth as needed for diarrhea or loose stools. What changed: You were already taking a medication with the same name, and this prescription was added. Make sure you understand how and when to take each.   losartan 100 MG tablet Commonly known as: COZAAR Take 100 mg by mouth daily.   magnesium oxide 400 MG tablet Commonly known as: MAG-OX Take 400 mg by mouth at bedtime.   metFORMIN 1000 MG tablet Commonly known as: GLUCOPHAGE Take 1 tablet (1,000 mg total) by mouth 2 (two) times daily.   NovoLOG FlexPen 100 UNIT/ML FlexPen Generic drug: insulin aspart INJECT 8 TO 14 UNITS UNDER THE SKIN THREE TIMES DAILY WITH MEALS   nystatin cream Commonly known as: MYCOSTATIN Apply twice per day to rash   OXALIPLATIN IV Inject  into the vein every 14 (fourteen) days.   pantoprazole 40 MG tablet Commonly known as: PROTONIX Take 1 tablet (40 mg total) by mouth 2 (two) times daily before a meal.   pregabalin 100 MG capsule Commonly known as: LYRICA Take 1 capsule (100 mg total) by mouth 2 (two) times daily.   prochlorperazine 10 MG tablet Commonly known as: COMPAZINE Take 1 tablet (10 mg total) by mouth every 6 (six) hours as needed for nausea or vomiting.   tirzepatide 10 MG/0.5ML Pen Commonly known as: MOUNJARO Inject 10 mg into the skin once a week.   True Metrix Blood Glucose Test test strip Generic drug: glucose blood TEST BLOOD SUGAR TWICE DAILY   Vitamin D3 125 MCG (5000 UT) Tabs Generic drug: Cholecalciferol Take 5,000 Units by mouth daily.        Follow-up Information     Nickola Major, MD. Schedule an appointment as soon as possible for a visit in 1 week(s).   Specialty: Family Medicine Contact information: [phone]  [address] [place] [address] [phone]         Derek Jack, MD. Go to.   Specialty: Hematology Contact information: [place] [address] [phone]                Allergies  Allergen Reactions   Contrast Media [Iodinated Contrast Media] Other (See Comments)    Burning   Sulfa Antibiotics Nausea And Vomiting    Other reaction(s): GI Upset (intolerance) unknown   Doxepin Rash and Swelling   Tape Rash    Consultations: None  Procedures/Studies: IR IMAGING GUIDED PORT INSERTION  Result Date: 01/15/2023 INDICATION: Chemotherapy EXAM: Chest port placement using ultrasound and fluoroscopic guidance MEDICATIONS: Documented in the EMR ANESTHESIA/SEDATION: Moderate (conscious) sedation was employed during this procedure. A total of Versed 1.5 mg and Fentanyl 75 mcg was administered intravenously. Moderate Sedation Time: 30 minutes. The patient's level of consciousness and vital signs were monitored continuously by  radiology nursing throughout the procedure under my direct supervision. FLUOROSCOPY TIME:  Fluoroscopy Time: 0.4 minutes (16 mGy) COMPLICATIONS: None immediate. PROCEDURE: Informed written consent was obtained from the patient after a thorough discussion of the procedural risks, benefits and alternatives. All questions were addressed. Maximal Sterile Barrier Technique was utilized including caps, mask, sterile gowns, sterile gloves, sterile drape, hand hygiene and skin antiseptic. A timeout was performed prior to the initiation of the procedure. The patient was placed supine on the exam table. The right neck and chest was prepped and draped in the standard sterile fashion. A preliminary ultrasound of the right neck was performed and demonstrates a patent right internal jugular vein. A permanent ultrasound image was stored in the electronic medical record. The overlying skin was anesthetized with 1% Lidocaine. Using ultrasound guidance, access was obtained into the right internal jugular vein using a 21 gauge micropuncture set. A wire was advanced into the SVC, a short incision was made at the puncture site, and serial dilatation performed. Next, in an ipsilateral infraclavicular location, an incision was made at the site of the subcutaneous reservoir. Blunt dissection was used to open a pocket to contain the reservoir. A subcutaneous tunnel was then created from the port site to the puncture site. A(n) 8 Fr single lumen catheter was advanced through the tunnel. The catheter was attached to the port and this was placed in the subcutaneous pocket. Under fluoroscopic guidance, a peel away sheath was placed, and the catheter was trimmed to the appropriate length and was advanced into the central veins. The catheter length is 21 cm. The tip of the catheter lies near the superior cavoatrial junction. The port flushes and aspirates appropriately. The port was flushed and locked with heparinized saline. The port pocket was  closed in 2 layers using 3-0 and 4-0 Vicryl/absorbable suture. Dermabond was also applied to both incisions. The patient tolerated the procedure well and was transferred to recovery in stable condition. IMPRESSION: Successful placement of a right-sided chest port via the right internal jugular vein. The port is ready for immediate use. Electronically Signed   By: Albin Felling M.D.   On: 01/15/2023 14:53   NM PET Image Initial (PI) Skull Base To Thigh  Result Date: 01/01/2023 CLINICAL DATA:  Initial treatment strategy for rectal cancer. EXAM: NUCLEAR MEDICINE PET SKULL BASE TO THIGH TECHNIQUE: 14.6 mCi F-18 FDG was injected intravenously. Full-ring PET imaging was performed from the skull base to thigh after the radiotracer. CT data was obtained and used for attenuation correction and  anatomic localization. Fasting blood glucose: 198 mg/dl COMPARISON:  Chest abdomen pelvis CT 12/17/2022 FINDINGS: Mediastinal blood pool activity: SUV max 3.2 Liver activity: SUV max NA NECK: No hypermetabolic lymph nodes in the neck. Incidental CT findings: None. CHEST: No hypermetabolic mediastinal or hilar nodes. No suspicious pulmonary nodules on the CT scan. 2.3 cm inferior right thyroid nodule noted image 68/3 without hypermetabolism. The 3 mm right middle lobe subpleural nodule identified on the previous CT scan is not evident on CT or PET imaging today. Incidental CT findings: Asymmetric elevation right hemidiaphragm. Atelectasis or scarring noted right base. ABDOMEN/PELVIS: No abnormal hypermetabolic activity within the liver, pancreas, adrenal glands, or spleen. No hypermetabolic lymph nodes in the abdomen or pelvis. Patient's known rectal lesion is markedly hypermetabolic with SUV max = 40. No discernible hypermetabolic perirectal or pelvic lymphadenopathy. The subtle too small to characterize hypoattenuating lesion seen in the hepatic dome on previous CT show no detectable hypermetabolism on PET imaging. There is  hypermetabolism in the region of the gallbladder neck with SUV max = 4.9. This corresponds to the subtle 13 mm focus of increased attenuation in the gallbladder neck. While this could be a noncalcified stone, given the FDG accumulation in this region, right upper quadrant ultrasound recommended to further evaluate as gallbladder polyp/neoplasm could have this appearance. Incidental CT findings: As noted previously, liver morphology raises the question of cirrhosis. Diffusely decreased attenuation of liver parenchyma is compatible with fatty deposition. 2.7 cm exophytic posterior right interpolar lesion shows no substantial hypermetabolism with SUV max = 2.8. diverticular changes noted left colon without diverticulitis. SKELETON: No focal hypermetabolic activity to suggest skeletal metastasis. Incidental CT findings: No worrisome lytic or sclerotic osseous abnormality. Skin thickening in superficial subcutaneous edema noted lower anterior abdominal wall, similar to prior. Cellulitis/panniculitis not excluded. IMPRESSION: 1. The patient's known rectal lesion is markedly hypermetabolic. No evidence for hypermetabolic perirectal or pelvic lymphadenopathy. 2. No other evidence for hypermetabolic metastatic disease in the neck, chest, abdomen, or pelvis. 3. The subtle too small to characterize hypoattenuating lesions seen in the hepatic dome on previous CT show no detectable hypermetabolism on PET imaging. 4. Focal hypermetabolism in the region of the gallbladder neck. This corresponds to a subtle 13 mm focus of increased attenuation in the lumen of the gallbladder. While on CT imaging this could be a noncalcified stone, given the FDG accumulation in this region, gallbladder polyp/neoplasm is a concern. Right upper quadrant ultrasound recommended to further evaluate. 5. 2.7 cm exophytic posterior right interpolar renal lesion shows no substantial hypermetabolism on PET imaging. Likely a cyst complicated by proteinaceous  debris or hemorrhage, close attention on follow-up recommended. 6. 2.3 cm inferior right thyroid nodule without hypermetabolism on PET imaging. Recommend thyroid US (ref: J Am Coll Radiol. 2015 Feb;12(2): 143-50). 7. Similar skin thickening with superficial edema associated with the lower anterior abdominal wall. Electronically Signed   By: Misty Stanley M.D.   On: 01/01/2023 09:51     Discharge Exam: Vitals:   01/28/23 1136 01/28/23 1213  BP:  122/69  Pulse:    Resp:    Temp: 98.8 F (37.1 C) 98.8 F (37.1 C)  SpO2:     Vitals:   01/28/23 1012 01/28/23 1020 01/28/23 1136 01/28/23 1213  BP: (!) 113/57   122/69  Pulse: 99     Resp: 20     Temp: 99.7 F (37.6 C) 99.7 F (37.6 C) 98.8 F (37.1 C) 98.8 F (37.1 C)  TempSrc:  Oral Oral Oral  SpO2:      Weight:        General: Obese adult female resting in bed no acute distress.  Appears somewhat tired but otherwise well and converses pleasantly. HEENT: MMM.  Pounds conjunctiva. Lungs: Clear to auscultation bilaterally. Cardiovascular: Regular rate and rhythm.  2/6 systolic ejection murmur heard best at upper sternal borders.  No rubs or gallops. Abdomen: Nontender to palpation.  Soft, nondistended. Extremities: Warm, perfusing.  Capillary refill less than 3 seconds. Skin: Some generalized pallor. Neuro: Alert and oriented x 4, no focal neurological deficit.   The results of significant diagnostics from this hospitalization (including imaging, microbiology, ancillary and laboratory) are listed below for reference.    Microbiology: Recent Results (from the past 240 hour(s))  MRSA Next Gen by PCR, Nasal     Status: None   Collection Time: 01/27/23  6:00 PM   Specimen: Nasal Mucosa; Nasal Swab  Result Value Ref Range Status   MRSA by PCR Next Gen NOT DETECTED NOT DETECTED Final    Comment: (NOTE) The GeneXpert MRSA Assay (FDA approved for NASAL specimens only), is one component of a comprehensive MRSA colonization  surveillance program. It is not intended to diagnose MRSA infection nor to guide or monitor treatment for MRSA infections. Test performance is not FDA approved in patients less than 26 years old. Performed at [facility], [address] [place], [place] [address]      Labs: BNP (last 3 results) No results for input(s): "BNP" in the last 8760 hours. Basic Metabolic Panel: Recent Labs  Lab 01/26/23 1503 01/27/23 1037 01/28/23 0126  NA 136 135 134*  K 4.5 4.0 3.8  CL 104 104 105  CO2 24 21* 22  GLUCOSE 156* 199* 139*  BUN '13 14 14  '$ CREATININE 0.84 0.90 0.85  CALCIUM 8.6 8.4* 7.9*  MG  --  1.8  --    Liver Function Tests: Recent Labs  Lab 01/26/23 1503 01/27/23 1037  AST 40* 44*  ALT 21 23  ALKPHOS  --  43  BILITOT 0.8 0.9  PROT 6.2 6.0*  ALBUMIN  --  3.1*   No results for input(s): "LIPASE", "AMYLASE" in the last 168 hours. No results for input(s): "AMMONIA" in the last 168 hours. CBC: Recent Labs  Lab 01/26/23 1503 01/27/23 1037 01/27/23 1932 01/28/23 0126 01/28/23 1347  WBC 3.9 1.3* 1.1* 0.8*  --   NEUTROABS  --  0.7*  --   --   --   HGB 5.8* 4.8* 6.4* 6.8* 8.1*  HCT 20.7* 17.7* 21.7* 22.6* 26.2*  MCV 72.9* 76.6* 77.5* 79.0*  --   PLT 209 115* 114* 99*  --    Cardiac Enzymes: No results for input(s): "CKTOTAL", "CKMB", "CKMBINDEX", "TROPONINI" in the last 168 hours. BNP: Invalid input(s): "POCBNP" CBG: Recent Labs  Lab 01/27/23 1734 01/27/23 1953 01/27/23 2055 01/28/23 0727 01/28/23 1134  GLUCAP 162* 136* 125* 158* 163*   D-Dimer No results for input(s): "DDIMER" in the last 72 hours. Hgb A1c No results for input(s): "HGBA1C" in the last 72 hours. Lipid Profile No results for input(s): "CHOL", "HDL", "LDLCALC", "TRIG", "CHOLHDL", "LDLDIRECT" in the last 72 hours. Thyroid function studies No results for input(s): "TSH", "T4TOTAL", "T3FREE", "THYROIDAB" in the last 72 hours.  Invalid input(s): "FREET3" Anemia work up No results for  input(s): "VITAMINB12", "FOLATE", "FERRITIN", "TIBC", "IRON", "RETICCTPCT" in the last 72 hours. Urinalysis    Component Value Date/Time   COLORURINE YELLOW 09/11/2018 0959   APPEARANCEUR HAZY (A)  09/11/2018 0959   LABSPEC 1.026 09/11/2018 0959   PHURINE 5.0 09/11/2018 0959   GLUCOSEU NEGATIVE 09/11/2018 0959   HGBUR SMALL (A) 09/11/2018 0959   BILIRUBINUR NEGATIVE 09/11/2018 0959   KETONESUR 20 (A) 09/11/2018 0959   PROTEINUR 30 (A) 09/11/2018 0959   UROBILINOGEN 0.2 03/21/2013 0800   NITRITE NEGATIVE 09/11/2018 0959   LEUKOCYTESUR NEGATIVE 09/11/2018 0959   Sepsis Labs Recent Labs  Lab 01/26/23 1503 01/27/23 1037 01/27/23 1932 01/28/23 0126  WBC 3.9 1.3* 1.1* 0.8*   Microbiology Recent Results (from the past 240 hour(s))  MRSA Next Gen by PCR, Nasal     Status: None   Collection Time: 01/27/23  6:00 PM   Specimen: Nasal Mucosa; Nasal Swab  Result Value Ref Range Status   MRSA by PCR Next Gen NOT DETECTED NOT DETECTED Final    Comment: (NOTE) The GeneXpert MRSA Assay (FDA approved for NASAL specimens only), is one component of a comprehensive MRSA colonization surveillance program. It is not intended to diagnose MRSA infection nor to guide or monitor treatment for MRSA infections. Test performance is not FDA approved in patients less than 11 years old. Performed at [facility], [address], [place], [place] [address]      Time coordinating discharge: 35 minutes  SIGNED:   Abdou Stocks Katy Fitch, MS4 [facility] 01/28/2023, 2:36 PM  If 7PM-7AM, please contact night-coverage [email or web address]

## 2023-01-29 ENCOUNTER — Inpatient Hospital Stay: Payer: Medicare HMO

## 2023-01-29 ENCOUNTER — Encounter: Payer: Self-pay | Admitting: Hematology

## 2023-01-29 VITALS — BP 82/55 | HR 97 | Temp 97.5°F | Resp 18

## 2023-01-29 DIAGNOSIS — Z5111 Encounter for antineoplastic chemotherapy: Secondary | ICD-10-CM | POA: Diagnosis not present

## 2023-01-29 DIAGNOSIS — D62 Acute posthemorrhagic anemia: Secondary | ICD-10-CM

## 2023-01-29 DIAGNOSIS — I959 Hypotension, unspecified: Secondary | ICD-10-CM

## 2023-01-29 LAB — CBC WITH DIFFERENTIAL/PLATELET
Abs Immature Granulocytes: 0 10*3/uL (ref 0.00–0.07)
Basophils Absolute: 0 10*3/uL (ref 0.0–0.1)
Basophils Relative: 0 %
Eosinophils Absolute: 0 10*3/uL (ref 0.0–0.5)
Eosinophils Relative: 6 %
HCT: 24.1 % — ABNORMAL LOW (ref 36.0–46.0)
Hemoglobin: 7.6 g/dL — ABNORMAL LOW (ref 12.0–15.0)
Lymphocytes Relative: 84 %
Lymphs Abs: 0.5 10*3/uL — ABNORMAL LOW (ref 0.7–4.0)
MCH: 25.2 pg — ABNORMAL LOW (ref 26.0–34.0)
MCHC: 31.5 g/dL (ref 30.0–36.0)
MCV: 80.1 fL (ref 80.0–100.0)
Monocytes Absolute: 0 10*3/uL — ABNORMAL LOW (ref 0.1–1.0)
Monocytes Relative: 4 %
Neutro Abs: 0 10*3/uL — CL (ref 1.7–7.7)
Neutrophils Relative %: 6 %
Platelets: 83 10*3/uL — ABNORMAL LOW (ref 150–400)
RBC: 3.01 MIL/uL — ABNORMAL LOW (ref 3.87–5.11)
RDW: 19.6 % — ABNORMAL HIGH (ref 11.5–15.5)
WBC: 0.6 10*3/uL — CL (ref 4.0–10.5)
nRBC: 0 % (ref 0.0–0.2)

## 2023-01-29 LAB — SAMPLE TO BLOOD BANK

## 2023-01-29 LAB — PREPARE RBC (CROSSMATCH)

## 2023-01-29 MED ORDER — SODIUM CHLORIDE 0.9% IV SOLUTION
250.0000 mL | Freq: Once | INTRAVENOUS | Status: AC
  Administered 2023-01-29: 250 mL via INTRAVENOUS

## 2023-01-29 MED ORDER — SODIUM CHLORIDE 0.9 % IV SOLN: INTRAVENOUS | Status: DC

## 2023-01-29 MED ORDER — ACETAMINOPHEN 325 MG PO TABS
650.0000 mg | ORAL_TABLET | Freq: Four times a day (QID) | ORAL | Status: DC | PRN
  Administered 2023-01-29: 650 mg via ORAL
  Filled 2023-01-29: qty 2

## 2023-01-29 MED ORDER — DIPHENHYDRAMINE HCL 50 MG/ML IJ SOLN: 25.0000 mg | Freq: Once | INTRAMUSCULAR | Status: DC

## 2023-01-29 MED ORDER — DIPHENHYDRAMINE HCL 25 MG PO CAPS
25.0000 mg | ORAL_CAPSULE | Freq: Four times a day (QID) | ORAL | Status: DC | PRN
  Administered 2023-01-29: 25 mg via ORAL
  Filled 2023-01-29: qty 1

## 2023-01-29 MED ORDER — HEPARIN SOD (PORK) LOCK FLUSH 100 UNIT/ML IV SOLN
500.0000 [IU] | Freq: Every day | INTRAVENOUS | Status: AC | PRN
  Administered 2023-01-29: 500 [IU]

## 2023-01-29 MED ORDER — SODIUM CHLORIDE 0.9% FLUSH
10.0000 mL | INTRAVENOUS | Status: AC | PRN
  Administered 2023-01-29: 10 mL

## 2023-01-29 NOTE — Progress Notes (Signed)
Patient presents today for 1 unit of blood per orders. Patient tolerated therapy with no complaints voiced. Bp Hypotensive, Carrie Abernethy, PA made aware, advised to give 1 liter of Normal saline. Patient still hypotensive, asymptomatic. Reports some blood when using bathroom. Carrie Abernethy, PA made aware. See provider note.  Side effects with management reviewed with understanding verbalized. Port site clean and dry with no bruising or swelling noted at site. Good blood return noted before and after administration of therapy. Band aid applied. Patient left in satisfactory condition with VSS and no s/s of distress noted.

## 2023-01-29 NOTE — Progress Notes (Signed)
Patients port flushed without difficulty.  Good blood return noted with no bruising or swelling noted at site.  Patient remains accessed for possible blood transfusion.  

## 2023-01-29 NOTE — Progress Notes (Signed)
Late entry:  patient was directly admitted to [facility] per Tarri Abernethy, PA and Dr. Fredric Dine from the cancer center.   Patient was transported in stable condition via wheelchair by this RN and Acquanetta Chain, RN to her room.  Report was given to RN.

## 2023-01-29 NOTE — Patient Instructions (Addendum)
You were given 2 units of blood on 3/6.  You were directly admitted to the hospital for further management of your symptoms.  Follow up with cancer center once discharged.

## 2023-01-29 NOTE — Patient Instructions (Signed)
[facility]  Discharge Instructions: Thank you for choosing [facility] to provide your oncology and hematology care.  If you have a lab appointment with the [facility], please come in thru the Main Entrance and check in at the main information desk.  Wear comfortable clothing and clothing appropriate for easy access to any Portacath or PICC line.   We strive to give you quality time with your provider. You may need to reschedule your appointment if you arrive late (15 or more minutes).  Arriving late affects you and other patients whose appointments are after yours.  Also, if you miss three or more appointments without notifying the office, you may be dismissed from the clinic at the provider's discretion.      For prescription refill requests, have your pharmacy contact our office and allow 72 hours for refills to be completed.    Today you received the following 1 unit of blood and 1 liter of fluids. Per Tarri Abernethy, PA HOLD BP meds until visit on Monday. Report to ER if you experience dizziness, lightheaded, increased rectal bleeding, blurred vision. Return as scheduled.   To help prevent nausea and vomiting after your treatment, we encourage you to take your nausea medication as directed.  BELOW ARE SYMPTOMS THAT SHOULD BE REPORTED IMMEDIATELY: *FEVER GREATER THAN 100.4 F (38 C) OR HIGHER *CHILLS OR SWEATING *NAUSEA AND VOMITING THAT IS NOT CONTROLLED WITH YOUR NAUSEA MEDICATION *UNUSUAL SHORTNESS OF BREATH *UNUSUAL BRUISING OR BLEEDING *URINARY PROBLEMS (pain or burning when urinating, or frequent urination) *BOWEL PROBLEMS (unusual diarrhea, constipation, pain near the anus) TENDERNESS IN MOUTH AND THROAT WITH OR WITHOUT PRESENCE OF ULCERS (sore throat, sores in mouth, or a toothache) UNUSUAL RASH, SWELLING OR PAIN  UNUSUAL VAGINAL DISCHARGE OR ITCHING   Items with * indicate a potential emergency and should be followed up as soon as  possible or go to the Emergency Department if any problems should occur.  Please show the CHEMOTHERAPY ALERT CARD or IMMUNOTHERAPY ALERT CARD at check-in to the Emergency Department and triage nurse.  Should you have questions after your visit or need to cancel or reschedule your appointment, please contact [facility] [phone]  and follow the prompts.  Office hours are 8:00 a.m. to 4:30 p.m. Monday - Friday. Please note that voicemails left after 4:00 p.m. may not be returned until the following business day.  We are closed weekends and major holidays. You have access to a nurse at all times for urgent questions. Please call the main number to the clinic [phone] and follow the prompts.  For any non-urgent questions, you may also contact your provider using MyChart. We now offer e-Visits for anyone 30 and older to request care online for non-urgent symptoms. For details visit [email or web address].   Also download the MyChart app! Go to the app store, search "MyChart", open the app, select [facility], and log in with your MyChart username and password.

## 2023-01-29 NOTE — Progress Notes (Addendum)
Patient has borderline hypotension today.  Manual blood pressure 86/62.  She took Losartan 100 mg at home this morning.    She has not had any major blood loss in the last 24 hours.  She reports "little spots of blood" when she has bowel movements, but denies any gross hematochezia.  She has had intermittent watery diarrhea (1 episode today).  She has been able to tolerate liquids and is agreeable to drinking 64 to 80 ounces of water daily at home.  She received 1 unit PRBC and 1 L NS in clinic today.    She reports that she "feels fine," and specifically denies any lightheadedness, dizziness, mental status changes, chest pain, difficulty breathing, weakness.  She reports that this has happened before and that she "has just had to stop her blood pressure for a few days before it goes back to normal."  Since patient is asymptomatic and reports that she "feels fine and wants to go home," we will allow her to discharge home from clinic today, but with STRICT return precautions.  She is instructed to go immediately to the emergency department should she develop any lightheadedness, dizziness, mental status changes chest pain, difficulty breathing, or weakness.  Patient instructed to NOT TAKE LOSARTAN over the weekend and that we will reassess when she returns on Monday.  Patient verbalizes understanding and acceptance of the risks of going home with marginal blood pressure.  We did offer discussion with hospitalist versus ED visit, which patient declines at this time.  She has family members living with her at home and good support in the environment.  **ADDENDUM: Patient also noted to have severe neutropenia today with WBC 0.6/ANC 0.0.  Education provided on neutropenic precautions.  Dr. Delton Coombes notified of neutropenia, and per his instructions no intervention is necessary unless patient becomes febrile.

## 2023-01-31 LAB — BPAM RBC
Blood Product Expiration Date: 202403282359
Blood Product Expiration Date: 202403292359
Blood Product Expiration Date: 202404062359
Blood Product Expiration Date: 202404062359
Blood Product Expiration Date: 202404062359
Blood Product Expiration Date: 202403292359
ISSUE DATE / TIME: 202403061358
ISSUE DATE / TIME: 202403061530
ISSUE DATE / TIME: 202403062101
ISSUE DATE / TIME: 202403070945
ISSUE DATE / TIME: 202403081121
Unit Type and Rh: 6200
Unit Type and Rh: 6200
Unit Type and Rh: 6200
Unit Type and Rh: 6200
Unit Type and Rh: 6200
Unit Type and Rh: 6200

## 2023-01-31 LAB — TYPE AND SCREEN
ABO/RH(D): A POS
ABO/RH(D): A POS
Antibody Screen: NEGATIVE
Antibody Screen: NEGATIVE
Unit division: 0
Unit division: 0
Unit division: 0
Unit division: 0
Unit division: 0
Unit division: 0

## 2023-02-01 ENCOUNTER — Ambulatory Visit: Payer: Medicare HMO | Admitting: Dietician

## 2023-02-01 ENCOUNTER — Inpatient Hospital Stay: Payer: Medicare HMO

## 2023-02-01 ENCOUNTER — Other Ambulatory Visit: Payer: Self-pay

## 2023-02-01 VITALS — BP 102/74 | HR 103 | Temp 97.0°F | Resp 19

## 2023-02-01 DIAGNOSIS — Z95828 Presence of other vascular implants and grafts: Secondary | ICD-10-CM

## 2023-02-01 DIAGNOSIS — D62 Acute posthemorrhagic anemia: Secondary | ICD-10-CM

## 2023-02-01 DIAGNOSIS — Z5111 Encounter for antineoplastic chemotherapy: Secondary | ICD-10-CM | POA: Diagnosis not present

## 2023-02-01 DIAGNOSIS — I959 Hypotension, unspecified: Secondary | ICD-10-CM

## 2023-02-01 DIAGNOSIS — D509 Iron deficiency anemia, unspecified: Secondary | ICD-10-CM

## 2023-02-01 LAB — CBC WITH DIFFERENTIAL/PLATELET
Abs Immature Granulocytes: 0 10*3/uL (ref 0.00–0.07)
Basophils Absolute: 0 10*3/uL (ref 0.0–0.1)
Basophils Relative: 1 %
Eosinophils Absolute: 0 10*3/uL (ref 0.0–0.5)
Eosinophils Relative: 3 %
HCT: 23.1 % — ABNORMAL LOW (ref 36.0–46.0)
Hemoglobin: 7.3 g/dL — ABNORMAL LOW (ref 12.0–15.0)
Immature Granulocytes: 0 %
Lymphocytes Relative: 51 %
Lymphs Abs: 0.7 10*3/uL (ref 0.7–4.0)
MCH: 25.5 pg — ABNORMAL LOW (ref 26.0–34.0)
MCHC: 31.6 g/dL (ref 30.0–36.0)
MCV: 80.8 fL (ref 80.0–100.0)
Monocytes Absolute: 0.5 10*3/uL (ref 0.1–1.0)
Monocytes Relative: 35 %
Neutro Abs: 0.1 10*3/uL — CL (ref 1.7–7.7)
Neutrophils Relative %: 10 %
Platelets: 112 10*3/uL — ABNORMAL LOW (ref 150–400)
RBC: 2.86 MIL/uL — ABNORMAL LOW (ref 3.87–5.11)
RDW: 19.8 % — ABNORMAL HIGH (ref 11.5–15.5)
WBC: 1.3 10*3/uL — CL (ref 4.0–10.5)
nRBC: 0 % (ref 0.0–0.2)

## 2023-02-01 LAB — SAMPLE TO BLOOD BANK

## 2023-02-01 LAB — PREPARE RBC (CROSSMATCH)

## 2023-02-01 MED ORDER — DIPHENHYDRAMINE HCL 25 MG PO CAPS
25.0000 mg | ORAL_CAPSULE | Freq: Once | ORAL | Status: AC
  Administered 2023-02-01: 25 mg via ORAL
  Filled 2023-02-01: qty 1

## 2023-02-01 MED ORDER — SODIUM CHLORIDE 0.9% FLUSH
10.0000 mL | Freq: Once | INTRAVENOUS | Status: AC
  Administered 2023-02-01: 10 mL via INTRAVENOUS

## 2023-02-01 MED ORDER — ACETAMINOPHEN 325 MG PO TABS
650.0000 mg | ORAL_TABLET | Freq: Once | ORAL | Status: AC
  Administered 2023-02-01: 650 mg via ORAL
  Filled 2023-02-01: qty 2

## 2023-02-01 MED ORDER — FILGRASTIM-SNDZ 480 MCG/0.8ML IJ SOSY
480.0000 ug | PREFILLED_SYRINGE | Freq: Once | INTRAMUSCULAR | Status: AC
  Administered 2023-02-01: 480 ug via SUBCUTANEOUS
  Filled 2023-02-01: qty 0.8

## 2023-02-01 MED ORDER — HEPARIN SOD (PORK) LOCK FLUSH 100 UNIT/ML IV SOLN
500.0000 [IU] | Freq: Every day | INTRAVENOUS | Status: AC | PRN
  Administered 2023-02-01: 500 [IU]

## 2023-02-01 MED ORDER — SODIUM CHLORIDE 0.9% IV SOLUTION
250.0000 mL | Freq: Once | INTRAVENOUS | Status: AC
  Administered 2023-02-01: 250 mL via INTRAVENOUS

## 2023-02-01 MED ORDER — SODIUM CHLORIDE 0.9 % IV SOLN: Freq: Once | INTRAVENOUS | Status: AC

## 2023-02-01 NOTE — Progress Notes (Signed)
Patient presents today for possible blood. She feels tired this morning, is pale. She states she has not had any bleeding since Friday night & her last BM was yesterday or this morning. She states over the weekend her BP had gotten in the 60s, she did not go to the ER, she stated she felt fine. BP on arrival was 112/72 and HR was 113, retook BP after labs and it was 90/70 and HR 107. Tarri Abernethy, PA made aware. Per PA give 1L of Normal Saline over 1 hour.   Hemoglobin 7.3, ANC 0.1, WBC 1.3, per Tarri Abernethy, PA give 1 unit of blood today and give Zarxio injection today and tomorrow.  Patient tolerated therapy and injection with no complaints voiced. Side effects with management reviewed with understanding verbalized. Port site clean and dry with no bruising or swelling noted at site. Good blood return noted before and after administration of therapy. Patient left in satisfactory condition with VSS and no s/s of distress noted.

## 2023-02-01 NOTE — Progress Notes (Signed)
I performed a brief bedside evaluation on the patient today while she was getting PRBC transfusion.  She denies any evidence of blood loss over the weekend, reports that her diarrhea has resolved and that her bowel movements are a smooth and soft consistency.  She had some hypotension over the weekend and reports that her SBP at home was as low as 60.  However, she did not go to the emergency department as she "felt fine."  Blood pressure today after her 1 unit PRBC and 1 L NS was 102/74 with heart rate 103.  - Patient instructed to increase fluid intake at home and given strict return precautions. - Received 480 mcg G-CSF today due to persistent neutropenia.  Will return tomorrow for additional 480 mcg G-CSF to assist with recovery of WBCs prior to her next round of chemotherapy which is due for her to start on Wednesday.  Harriett Rush, PA-C  02/01/23 5:19 PM

## 2023-02-01 NOTE — Progress Notes (Signed)
Patient's orders in for 1 unit of blood today per Reb. Pennington PA-C.

## 2023-02-01 NOTE — Patient Instructions (Signed)
[facility]  Discharge Instructions: Thank you for choosing [facility] to provide your oncology and hematology care.  If you have a lab appointment with the [facility], please come in thru the Main Entrance and check in at the main information desk.  Wear comfortable clothing and clothing appropriate for easy access to any Portacath or PICC line.   We strive to give you quality time with your provider. You may need to reschedule your appointment if you arrive late (15 or more minutes).  Arriving late affects you and other patients whose appointments are after yours.  Also, if you miss three or more appointments without notifying the office, you may be dismissed from the clinic at the provider's discretion.      For prescription refill requests, have your pharmacy contact our office and allow 72 hours for refills to be completed.    Today you received the following 1 unit of blood, 1 L of normal saline and Zarxio injection, return as scheduled.   To help prevent nausea and vomiting after your treatment, we encourage you to take your nausea medication as directed.  BELOW ARE SYMPTOMS THAT SHOULD BE REPORTED IMMEDIATELY: *FEVER GREATER THAN 100.4 F (38 C) OR HIGHER *CHILLS OR SWEATING *NAUSEA AND VOMITING THAT IS NOT CONTROLLED WITH YOUR NAUSEA MEDICATION *UNUSUAL SHORTNESS OF BREATH *UNUSUAL BRUISING OR BLEEDING *URINARY PROBLEMS (pain or burning when urinating, or frequent urination) *BOWEL PROBLEMS (unusual diarrhea, constipation, pain near the anus) TENDERNESS IN MOUTH AND THROAT WITH OR WITHOUT PRESENCE OF ULCERS (sore throat, sores in mouth, or a toothache) UNUSUAL RASH, SWELLING OR PAIN  UNUSUAL VAGINAL DISCHARGE OR ITCHING   Items with * indicate a potential emergency and should be followed up as soon as possible or go to the Emergency Department if any problems should occur.  Please show the CHEMOTHERAPY ALERT CARD or IMMUNOTHERAPY ALERT CARD  at check-in to the Emergency Department and triage nurse.  Should you have questions after your visit or need to cancel or reschedule your appointment, please contact [facility] [phone]  and follow the prompts.  Office hours are 8:00 a.m. to 4:30 p.m. Monday - Friday. Please note that voicemails left after 4:00 p.m. may not be returned until the following business day.  We are closed weekends and major holidays. You have access to a nurse at all times for urgent questions. Please call the main number to the clinic [phone] and follow the prompts.  For any non-urgent questions, you may also contact your provider using MyChart. We now offer e-Visits for anyone 55 and older to request care online for non-urgent symptoms. For details visit [email or web address].   Also download the MyChart app! Go to the app store, search "MyChart", open the app, select [facility], and log in with your MyChart username and password.

## 2023-02-01 NOTE — Progress Notes (Signed)
CRITICAL VALUE ALERT Critical value received:  WBC 1.3 and ANC 0.1  Date of notification:  02/01/2023  Time of notification: [id number] Critical value read back:  Yes.   Nurse who received alert:  Reino Kent, RN MD notified time and response:  Derek Jack, MD

## 2023-02-01 NOTE — Progress Notes (Signed)
Nutrition Follow-up:  Patient with rectal cancer. She is receiving neoadjuvant FOLFOXIRI q28d (first 2/28) + IV Venofer   Met with patient and daughter in infusion. She is receiving blood products today. Patient reports tolerating first chemotherapy treatment well. She denies cold sensitivity. Patient hoping she will not experience this as she prefers water that is ice cold. She reports good appetite and eating well. Patient reports having regular bowel movements. She has been following low fiber diet as instructed. Daughter is asking for a list of foods patient should/should not have. Patient says she received the Ensure samples, however unable to recall if handouts were included.     Medications: reviewed   Labs: reviewed   Anthropometrics: Last wt 272 lb 14.9 oz on 3/7   NUTRITION DIAGNOSIS: Food and nutrition related knowledge deficit improving    INTERVENTION:  Continue low fiber diet - handout provided with list of foods  Continue bowel regimen per MD    MONITORING, EVALUATION, GOAL: weight trends, intake   NEXT VISIT: Monday April 1 via telephone

## 2023-02-02 ENCOUNTER — Inpatient Hospital Stay: Payer: Medicare HMO

## 2023-02-02 VITALS — BP 114/79 | HR 106 | Temp 96.2°F | Resp 20

## 2023-02-02 DIAGNOSIS — Z5111 Encounter for antineoplastic chemotherapy: Secondary | ICD-10-CM | POA: Diagnosis not present

## 2023-02-02 DIAGNOSIS — D509 Iron deficiency anemia, unspecified: Secondary | ICD-10-CM

## 2023-02-02 DIAGNOSIS — D62 Acute posthemorrhagic anemia: Secondary | ICD-10-CM

## 2023-02-02 LAB — BPAM RBC
Blood Product Expiration Date: 202403292359
ISSUE DATE / TIME: 202403111147
Unit Type and Rh: 6200

## 2023-02-02 LAB — TYPE AND SCREEN
ABO/RH(D): A POS
Antibody Screen: NEGATIVE
Unit division: 0

## 2023-02-02 MED ORDER — FILGRASTIM-SNDZ 480 MCG/0.8ML IJ SOSY
480.0000 ug | PREFILLED_SYRINGE | Freq: Once | INTRAMUSCULAR | Status: AC
  Administered 2023-02-02: 480 ug via SUBCUTANEOUS
  Filled 2023-02-02: qty 0.8

## 2023-02-02 NOTE — Progress Notes (Signed)
Patient tolerated injection with no complaints voiced. Site clean and dry with no bruising or swelling noted at site. See MAR for details. Band aid applied.  Patient stable during and after injection. VSS with discharge and left in satisfactory condition with no s/s of distress noted.  

## 2023-02-02 NOTE — Patient Instructions (Signed)
[facility]  Discharge Instructions: Thank you for choosing [facility] to provide your oncology and hematology care.  If you have a lab appointment with the [facility], please come in thru the Main Entrance and check in at the main information desk.  Wear comfortable clothing and clothing appropriate for easy access to any Portacath or PICC line.   We strive to give you quality time with your provider. You may need to reschedule your appointment if you arrive late (15 or more minutes).  Arriving late affects you and other patients whose appointments are after yours.  Also, if you miss three or more appointments without notifying the office, you may be dismissed from the clinic at the provider's discretion.      For prescription refill requests, have your pharmacy contact our office and allow 72 hours for refills to be completed.    Today you received the following Zarxio, return as scheduled.   To help prevent nausea and vomiting after your treatment, we encourage you to take your nausea medication as directed.  BELOW ARE SYMPTOMS THAT SHOULD BE REPORTED IMMEDIATELY: *FEVER GREATER THAN 100.4 F (38 C) OR HIGHER *CHILLS OR SWEATING *NAUSEA AND VOMITING THAT IS NOT CONTROLLED WITH YOUR NAUSEA MEDICATION *UNUSUAL SHORTNESS OF BREATH *UNUSUAL BRUISING OR BLEEDING *URINARY PROBLEMS (pain or burning when urinating, or frequent urination) *BOWEL PROBLEMS (unusual diarrhea, constipation, pain near the anus) TENDERNESS IN MOUTH AND THROAT WITH OR WITHOUT PRESENCE OF ULCERS (sore throat, sores in mouth, or a toothache) UNUSUAL RASH, SWELLING OR PAIN  UNUSUAL VAGINAL DISCHARGE OR ITCHING   Items with * indicate a potential emergency and should be followed up as soon as possible or go to the Emergency Department if any problems should occur.  Please show the CHEMOTHERAPY ALERT CARD or IMMUNOTHERAPY ALERT CARD at check-in to the Emergency Department and triage  nurse.  Should you have questions after your visit or need to cancel or reschedule your appointment, please contact [facility] [phone]  and follow the prompts.  Office hours are 8:00 a.m. to 4:30 p.m. Monday - Friday. Please note that voicemails left after 4:00 p.m. may not be returned until the following business day.  We are closed weekends and major holidays. You have access to a nurse at all times for urgent questions. Please call the main number to the clinic [phone] and follow the prompts.  For any non-urgent questions, you may also contact your provider using MyChart. We now offer e-Visits for anyone 62 and older to request care online for non-urgent symptoms. For details visit [email or web address].   Also download the MyChart app! Go to the app store, search "MyChart", open the app, select [facility], and log in with your MyChart username and password.

## 2023-02-03 ENCOUNTER — Inpatient Hospital Stay (HOSPITAL_BASED_OUTPATIENT_CLINIC_OR_DEPARTMENT_OTHER): Payer: Medicare HMO | Admitting: Hematology

## 2023-02-03 ENCOUNTER — Ambulatory Visit: Payer: Medicare HMO

## 2023-02-03 ENCOUNTER — Inpatient Hospital Stay: Payer: Medicare HMO

## 2023-02-03 ENCOUNTER — Encounter: Payer: Self-pay | Admitting: Hematology

## 2023-02-03 VITALS — BP 109/74 | HR 84 | Temp 96.9°F | Resp 20

## 2023-02-03 DIAGNOSIS — C20 Malignant neoplasm of rectum: Secondary | ICD-10-CM | POA: Diagnosis not present

## 2023-02-03 DIAGNOSIS — Z5111 Encounter for antineoplastic chemotherapy: Secondary | ICD-10-CM | POA: Diagnosis not present

## 2023-02-03 DIAGNOSIS — E876 Hypokalemia: Secondary | ICD-10-CM

## 2023-02-03 LAB — CBC WITH DIFFERENTIAL/PLATELET
Abs Immature Granulocytes: 0.68 10*3/uL — ABNORMAL HIGH (ref 0.00–0.07)
Basophils Absolute: 0 10*3/uL (ref 0.0–0.1)
Basophils Relative: 0 %
Eosinophils Absolute: 0.2 10*3/uL (ref 0.0–0.5)
Eosinophils Relative: 1 %
HCT: 26.4 % — ABNORMAL LOW (ref 36.0–46.0)
Hemoglobin: 8.6 g/dL — ABNORMAL LOW (ref 12.0–15.0)
Immature Granulocytes: 5 %
Lymphocytes Relative: 15 %
Lymphs Abs: 1.9 10*3/uL (ref 0.7–4.0)
MCH: 26.7 pg (ref 26.0–34.0)
MCHC: 32.6 g/dL (ref 30.0–36.0)
MCV: 82 fL (ref 80.0–100.0)
Monocytes Absolute: 1.3 10*3/uL — ABNORMAL HIGH (ref 0.1–1.0)
Monocytes Relative: 10 %
Neutro Abs: 8.5 10*3/uL — ABNORMAL HIGH (ref 1.7–7.7)
Neutrophils Relative %: 69 %
Platelets: 230 10*3/uL (ref 150–400)
RBC: 3.22 MIL/uL — ABNORMAL LOW (ref 3.87–5.11)
RDW: 20.5 % — ABNORMAL HIGH (ref 11.5–15.5)
WBC: 12.6 10*3/uL — ABNORMAL HIGH (ref 4.0–10.5)
nRBC: 1 % — ABNORMAL HIGH (ref 0.0–0.2)

## 2023-02-03 LAB — COMPREHENSIVE METABOLIC PANEL
ALT: 17 U/L (ref 0–44)
AST: 23 U/L (ref 15–41)
Albumin: 2.5 g/dL — ABNORMAL LOW (ref 3.5–5.0)
Alkaline Phosphatase: 58 U/L (ref 38–126)
Anion gap: 11 (ref 5–15)
BUN: 15 mg/dL (ref 8–23)
CO2: 21 mmol/L — ABNORMAL LOW (ref 22–32)
Calcium: 8.3 mg/dL — ABNORMAL LOW (ref 8.9–10.3)
Chloride: 103 mmol/L (ref 98–111)
Creatinine, Ser: 1.62 mg/dL — ABNORMAL HIGH (ref 0.44–1.00)
GFR, Estimated: 35 mL/min — ABNORMAL LOW (ref >60)
Glucose, Bld: 139 mg/dL — ABNORMAL HIGH (ref 70–99)
Potassium: 2.8 mmol/L — ABNORMAL LOW (ref 3.5–5.1)
Sodium: 135 mmol/L (ref 135–145)
Total Bilirubin: 1 mg/dL (ref 0.3–1.2)
Total Protein: 6 g/dL — ABNORMAL LOW (ref 6.5–8.1)

## 2023-02-03 LAB — MAGNESIUM: Magnesium: 1.4 mg/dL — ABNORMAL LOW (ref 1.7–2.4)

## 2023-02-03 MED ORDER — SODIUM CHLORIDE 0.9 % IV SOLN
10.0000 mg | Freq: Once | INTRAVENOUS | Status: AC
  Administered 2023-02-03: 10 mg via INTRAVENOUS
  Filled 2023-02-03: qty 10

## 2023-02-03 MED ORDER — DEXTROSE 5 % IV SOLN: Freq: Once | INTRAVENOUS | Status: AC

## 2023-02-03 MED ORDER — POTASSIUM CHLORIDE CRYS ER 20 MEQ PO TBCR
40.0000 meq | EXTENDED_RELEASE_TABLET | Freq: Once | ORAL | Status: AC
  Administered 2023-02-03: 40 meq via ORAL
  Filled 2023-02-03: qty 2

## 2023-02-03 MED ORDER — MAGNESIUM SULFATE 2 GM/50ML IV SOLN
2.0000 g | INTRAVENOUS | Status: AC
  Administered 2023-02-03: 2 g via INTRAVENOUS
  Filled 2023-02-03: qty 50

## 2023-02-03 MED ORDER — POTASSIUM CHLORIDE IN NACL 20-0.9 MEQ/L-% IV SOLN
Freq: Once | INTRAVENOUS | Status: AC
  Filled 2023-02-03: qty 1000

## 2023-02-03 MED ORDER — SODIUM CHLORIDE 0.9 % IV SOLN
398.5000 mg/m2 | Freq: Once | INTRAVENOUS | Status: AC
  Administered 2023-02-03: 1000 mg via INTRAVENOUS
  Filled 2023-02-03: qty 50

## 2023-02-03 MED ORDER — MAGNESIUM SULFATE 2 GM/50ML IV SOLN
2.0000 g | Freq: Once | INTRAVENOUS | Status: AC
  Administered 2023-02-03: 2 g via INTRAVENOUS

## 2023-02-03 MED ORDER — SODIUM CHLORIDE 0.9 % IV SOLN
150.0000 mg/m2 | Freq: Once | INTRAVENOUS | Status: AC
  Administered 2023-02-03: 400 mg via INTRAVENOUS
  Filled 2023-02-03: qty 20

## 2023-02-03 MED ORDER — SODIUM CHLORIDE 0.9 % IV SOLN
150.0000 mg | Freq: Once | INTRAVENOUS | Status: AC
  Administered 2023-02-03: 150 mg via INTRAVENOUS
  Filled 2023-02-03: qty 150

## 2023-02-03 MED ORDER — MAGNESIUM SULFATE 2 GM/50ML IV SOLN
2.0000 g | INTRAVENOUS | Status: DC
  Filled 2023-02-03: qty 50

## 2023-02-03 MED ORDER — OXALIPLATIN CHEMO INJECTION 100 MG/20ML
55.0000 mg/m2 | Freq: Once | INTRAVENOUS | Status: AC
  Administered 2023-02-03: 150 mg via INTRAVENOUS
  Filled 2023-02-03: qty 20

## 2023-02-03 MED ORDER — PALONOSETRON HCL INJECTION 0.25 MG/5ML
0.2500 mg | Freq: Once | INTRAVENOUS | Status: AC
  Administered 2023-02-03: 0.25 mg via INTRAVENOUS
  Filled 2023-02-03: qty 5

## 2023-02-03 MED ORDER — SODIUM CHLORIDE 0.9 % IV SOLN
1920.0000 mg/m2 | INTRAVENOUS | Status: DC
  Administered 2023-02-03: 5000 mg via INTRAVENOUS
  Filled 2023-02-03: qty 100

## 2023-02-03 NOTE — Progress Notes (Signed)
Patient presents today for treatment and follow up visit with Dr. Delton Coombes. Message received okay to treat. Creatinine 1.62 and MD aware. Potassium 2.8 and Magnesium 1.4. Patient will receive House Fluids with treatment and + 2 more grams of Magnesium Sulfate per MD orders for a total of 4. Orders received to give 40 mEq of Potassium PO.   Treatment given today per MD orders. Tolerated infusion without adverse affects. Vital signs stable. No complaints at this time. 5FU pump infusing with RUN noted on screen and verified with patient. Discharged from clinic by wheel chair in stable condition. Alert and oriented x 3. F/U with [facility] as scheduled.

## 2023-02-03 NOTE — Patient Instructions (Addendum)
[name] at [facility] Discharge Instructions   You were seen and examined today by Dr. Delton Coombes.  He reviewed the results of your lab work. Your kidney number is elevated at 1.6. Your magnesium is low at 1.4. Your potassium is low at 2.8. We will give you IV magnesium and potassium by mouth in the clinic. Your blood counts from today are pending. We have also added a long-acting white blood cell booster shot on the day that you come to get your pump removed. This will help keep your white blood cells from dropping too low.   We will proceed with your treatment today.   Continue to hold Losartan.   Continue magnesium as prescribed.   You may try Sitz baths to see if this helps.   Return as scheduled.    Thank you for choosing [facility] at [facility] to provide your oncology and hematology care.  To afford each patient quality time with our provider, please arrive at least 15 minutes before your scheduled appointment time.   If you have a lab appointment with the [facility] please come in thru the Main Entrance and check in at the main information desk.  You need to re-schedule your appointment should you arrive 10 or more minutes late.  We strive to give you quality time with our providers, and arriving late affects you and other patients whose appointments are after yours.  Also, if you no show three or more times for appointments you may be dismissed from the clinic at the providers discretion.     Again, thank you for choosing [facility].  Our hope is that these requests will decrease the amount of time that you wait before being seen by our physicians.       _____________________________________________________________  Should you have questions after your visit to [facility], please contact our office at [phone] and follow the prompts.  Our office hours are 8:00 a.m. and 4:30 p.m. Monday -  Friday.  Please note that voicemails left after 4:00 p.m. may not be returned until the following business day.  We are closed weekends and major holidays.  You do have access to a nurse 24-7, just call the main number to the clinic [phone] and do not press any options, hold on the line and a nurse will answer the phone.    For prescription refill requests, have your pharmacy contact our office and allow 72 hours.    Due to Covid, you will need to wear a mask upon entering the hospital. If you do not have a mask, a mask will be given to you at the Main Entrance upon arrival. For doctor visits, patients may have 1 support person age 72 or older with them. For treatment visits, patients can not have anyone with them due to social distancing guidelines and our immunocompromised population.

## 2023-02-03 NOTE — Progress Notes (Signed)
Received orders from MD to add Udenyca 6 mg Subq to each pump DC day.  Today give in addition to chemotherapy :   NS 1 liter + potassium chloride 20 meq x 1 dose over 2 hours  Magnesium sulfate 4 gm ivpb x 1 dose Potassium chloride 40 meq oral tablet x 1 dose  T.O. Dr Rhys Martini, PharmD

## 2023-02-03 NOTE — Progress Notes (Signed)
[facility] [address], [place] [address]    Clinic Day:  02/03/2023  Referring physician: Nickola Major, MD  Patient Care Team: Nickola Major, MD as PCP - General (Family Medicine) Satira Sark, MD as PCP - Cardiology (Cardiology) Gala Romney Cristopher Estimable, MD as Consulting Physician (Gastroenterology) Derek Jack, MD as Medical Oncologist (Medical Oncology) Brien Mates, RN as Oncology Nurse Navigator (Medical Oncology)   ASSESSMENT & PLAN:   Assessment: 1.  Stage IIc (T3d/4b N0 M0) rectal cancer: - Rectal bleeding for the past 4 to 5 years. - Colonoscopy (12/16/2022): Tumor protruding through the anal orifice.  Bulky semilunar lateral rectal tumor extending from the anorectal junction proximally about 13 cm. 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  MSI-high not detected by [id number] - CT CAP (12/17/2022): Locally advanced low rectal primary without bowel obstruction or nodal metastasis.  Isolated right middle lobe subpleural lung nodule most likely benign.  Cirrhosis and portal venous hypertension.  2.6 cm right renal lesion with differential hemorrhagic/proteinaceous cyst or solid neoplasm. - PET scan (12/31/2022): Markedly hypermetabolic rectal lesion with no evidence for perirectal or pelvic lymphadenopathy.  No metastatic disease in the neck, chest, abdomen or pelvis.  Focal hypermetabolism in the region of the gallbladder neck corresponds to subtle 13 mm focus of increased attenuation in the lumen of the gallbladder.  Differential includes gallbladder polyp/neoplasm.  2.7 cm exophytic posterior right interpolar renal lesion shows no substantial hypermetabolism, likely cyst.  2.3 cm inferior right thyroid nodule without hypermetabolism. - MRI pelvis (12/24/2022): Extension through muscularis propria.  Tumor size 3.8 x 3 x 5.8 cm with a volume 35 cm.  T stage is T3d, probable T4.   Suspicion of involvement of pelvic floor musculature and the far posterior and left side of the vagina.   2.  Social/family history: - She lives at home with her family.  She is accompanied by her daughter today.  She is independent of ADLs and IADLs.  She is a retired Freight forwarder at [employer].  Non-smoker.  She reports having hysterectomy in 1986 for cancerous polyps in her endometrium. - Maternal grandmother's sister had colon cancer.    Plan: 1.  Stage IIc (T3d/4BN0M0) rectal cancer, MSI stable: - She received cycle 1 of FOLFIRINOX on 12/31/2022. - She had diarrhea every day.  She took Imodium if she had more than 4 stools per day. - Losartan is on hold.  Blood pressure today is 90/63. - Reviewed labs today which showed elevated creatinine 1.62 which is new.  Potassium and magnesium are severely low. - She will receive IV fluids with electrolytes today.  CBC was grossly okay for cycle 2 of chemotherapy. - Will dose reduce 5-FU by 20% and maintain Irinotecan at 150 mg/m. - She will be given 1 L of fluids with electrolyte on day 3 when she comes back for pump DC. - She will be followed up in our symptom management clinic next week. - RTC 2 weeks for follow-up.   2.  Peripheral neuropathy from diabetes: - She has no feeling in the toes.  Continue Lyrica twice daily.  No worsening since oxaliplatin started.   3.  Severe microcytic anemia: - She has bleeding per rectum from the cancer site. - She is requiring transfusions on regular basis.  To closely monitor CBC.   4.  Folic acid deficiency: - Continue folic acid 1 mg tablet daily.  No orders of  the defined types were placed in this encounter.     I,[name],acting as a Education administrator for [employer], MD.,have documented all relevant documentation on the behalf of Derek Jack, MD,as directed by  Derek Jack, MD while in the presence of Derek Jack, MD.   I, Derek Jack MD, have reviewed  the above documentation for accuracy and completeness, and I agree with the above.   Derek Jack, MD   3/13/20247:08 PM  CHIEF COMPLAINT:   Diagnosis: stage IIc (T4b N0) low rectal cancer    Cancer Staging  Rectal cancer [facility]) Staging form: Colon and Rectum, AJCC 8th Edition - Clinical stage from 01/03/2023: Stage IIC (cT4b, cN0, cM0) - Unsigned    Prior Therapy: none  Current Therapy:  FOLFIRINOX    HISTORY OF PRESENT ILLNESS:   Oncology History  Rectal cancer ([facility])  01/03/2023 Initial Diagnosis   Rectal cancer ([facility])   01/20/2023 -  Chemotherapy   Patient is on Treatment Plan : RECTAL Modified FOLFIRINOX q14d x 8 cycles        INTERVAL HISTORY:   [name] is a 67 y.o. female presenting to clinic today for follow up of stage IIc (T4b N0) low rectal cancer. She was last seen by me on 01/20/23.  Patient was sent to the ED on 01/27/23 and admitted overnight after her Hgb was found to be 4.8 at her last appointment with [place]. She received 3 units PRBC and HGB improved to 8.1 before discharge home.  Today, she states that she is doing well overall. Her appetite level is at 10% . Her energy level is at 25%. She denies any worsening of her neuropathy.  Reports some fatigue.  Reports bleeding per rectum second week all week.  Also had some diarrhea.   PAST MEDICAL HISTORY:   Past Medical History: Past Medical History:  Diagnosis Date   Anxiety    Coronary atherosclerosis    Cardiac CT 09/2018 with calcium score 8.7 and mild proximal LAD disease   Essential hypertension 2009   GERD (gastroesophageal reflux disease)    Hypothyroidism    Iron deficiency anemia    Osteoarthritis    Palpitations    Tachypalpitations on beta blockers since 2010   Type 2 diabetes mellitus ([facility]) 2003   Vitamin B 12 deficiency    Vitamin D deficiency     Surgical History: Past Surgical History:  Procedure Laterality Date   ABDOMINAL HYSTERECTOMY     History of cervical cancer   BIOPSY   12/16/2022   Procedure: BIOPSY;  Surgeon: Daneil Dolin, MD;  [facility];  Service: Endoscopy;;  gastric, rectal   BREAST BIOPSY Right    COLONOSCOPY WITH PROPOFOL N/A 12/16/2022   Procedure: COLONOSCOPY WITH PROPOFOL;  Surgeon: Daneil Dolin, MD;  [facility];  Service: Endoscopy;  Laterality: N/A;  10:45 am   ESOPHAGOGASTRODUODENOSCOPY (EGD) WITH PROPOFOL N/A 12/16/2022   Procedure: ESOPHAGOGASTRODUODENOSCOPY (EGD) WITH PROPOFOL;  Surgeon: Daneil Dolin, MD;  [facility];  Service: Endoscopy;  Laterality: N/A;   IR IMAGING GUIDED PORT INSERTION  01/15/2023   MALONEY DILATION N/A 12/16/2022   Procedure: Venia Minks DILATION;  Surgeon: Daneil Dolin, MD;  [facility];  Service: Endoscopy;  Laterality: N/A;   POLYPECTOMY  12/16/2022   Procedure: POLYPECTOMY;  Surgeon: Daneil Dolin, MD;  [facility];  Service: Endoscopy;;   TONSILLECTOMY      Social History: Social History   Socioeconomic History  Marital status: Single    Spouse name: Not on file   Number of children: 2   Years of education: Not on file   Highest education level: Not on file  Occupational History   Occupation: DISABLED    Employer: UNEMPLOYED    Comment: [employer]  Tobacco Use   Smoking status: Never   Smokeless tobacco: Never  Vaping Use   Vaping Use: Never used  Substance and Sexual Activity   Alcohol use: No   Drug use: No   Sexual activity: Never  Other Topics Concern   Not on file  Social History Narrative   Has 2 grandchildren. 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   Social Determinants of Health   Financial Resource Strain: Not on file  Food Insecurity: No Food Insecurity (01/06/2023)   Hunger Vital Sign    Worried About Running Out of Food in the Last Year: Never true    Ran Out of Food in the Last Year: Never true  Transportation Needs: No Transportation Needs (01/06/2023)   PRAPARE - Armed forces logistics/support/administrative officer (Medical): No    Lack of Transportation (Non-Medical): No  Physical Activity: Not on file  Stress: Not on file  Social Connections: Not on file  Intimate Partner Violence: Not At Risk (01/06/2023)   Humiliation, Afraid, Rape, and Kick questionnaire    Fear of Current or Ex-Partner: No    Emotionally Abused: No    Physically Abused: No    Sexually Abused: No    Family History: Family History  Problem Relation Age of Onset   Heart failure Mother    Diabetes Father    Hypertension Father    Stroke Sister 62   Diabetes Brother    Cancer - Colon Other        age 31   Colon polyps Neg Hx     Current Medications:  Current Outpatient Medications:    blood glucose meter kit and supplies KIT, Dispense based on patient and insurance preference. Use up to four times daily as directed. (FOR ICD-10 E11.65), Disp: 1 each, Rfl: 0   Cholecalciferol (VITAMIN D3) 5000 units TABS, Take 5,000 Units by mouth daily., Disp: , Rfl:    cyanocobalamin (VITAMIN B12) 1000 MCG/ML injection, B12 shots at a frequency of once weekly for 4 weeks then can drop down to once monthly thereafter., Disp: , Rfl:    cyclobenzaprine (FLEXERIL) 10 MG tablet, Take 10 mg by mouth 3 (three) times daily as needed for muscle spasms., Disp: , Rfl:    dextrose 5 % SOLN 1,000 mL with fluorouracil 5 GM/100ML SOLN, Inject into the vein over 48 hr. Every 14 days, Disp: , Rfl:    docusate sodium (COLACE) 100 MG capsule, Take 1 capsule (100 mg total) by mouth 2 (two) times daily. Do NOT take this if you have diarrhea.  Try to maintain soft easy to pass bowel movements., Disp: 10 capsule, Rfl: 0   DROPLET PEN NEEDLES 31G X 5 MM MISC, USE AS INSTRUCTED TO INJECT INSULIN DAILY, Disp: 100 each, Rfl: 3   ferrous sulfate 325 (65 FE) MG EC tablet, Take 325 mg by mouth daily., Disp: , Rfl:    FLUOROURACIL IV, Inject into the vein every 14 (fourteen) days., Disp: , Rfl:    FLUoxetine (PROZAC) 20 MG capsule, Take 20 mg by  mouth daily., Disp: , Rfl:    folic acid (FOLVITE) 1 MG tablet, Take 1 tablet (1 mg total)  by mouth daily., Disp: 30 tablet, Rfl: 6   HYDROcodone-acetaminophen (NORCO) 5-325 MG tablet, Take 1 tablet by mouth every 6 (six) hours as needed for severe pain., Disp: 30 tablet, Rfl: 0   insulin aspart (NOVOLOG FLEXPEN) 100 UNIT/ML FlexPen, INJECT 8 TO 14 UNITS UNDER THE SKIN THREE TIMES DAILY WITH MEALS, Disp: 30 mL, Rfl: 0   insulin glargine (LANTUS SOLOSTAR) 100 UNIT/ML Solostar Pen, Inject 60 Units into the skin at bedtime., Disp: 30 mL, Rfl: 0   IRINOTECAN HCL IV, Inject into the vein every 14 (fourteen) days., Disp: , Rfl:    LEUCOVORIN CALCIUM IV, Inject into the vein every 14 (fourteen) days., Disp: , Rfl:    levothyroxine (SYNTHROID) 88 MCG tablet, TAKE 1 TABLET EVERY DAY BEFORE BREAKFAST, Disp: 90 tablet, Rfl: 1   lidocaine-prilocaine (EMLA) cream, Apply to affected area once, Disp: 30 g, Rfl: 3   loperamide (IMODIUM) 2 MG capsule, Take 1 capsule (2 mg total) by mouth as needed for diarrhea or loose stools., Disp: 30 capsule, Rfl: 0   magnesium oxide (MAG-OX) 400 MG tablet, Take 400 mg by mouth at bedtime., Disp: , Rfl:    metFORMIN (GLUCOPHAGE) 1000 MG tablet, Take 1 tablet (1,000 mg total) by mouth 2 (two) times daily., Disp: 180 tablet, Rfl: 10   OXALIPLATIN IV, Inject into the vein every 14 (fourteen) days., Disp: , Rfl:    pantoprazole (PROTONIX) 40 MG tablet, Take 1 tablet (40 mg total) by mouth 2 (two) times daily before a meal., Disp: 60 tablet, Rfl: 3   pregabalin (LYRICA) 100 MG capsule, Take 1 capsule (100 mg total) by mouth 2 (two) times daily., Disp: 180 capsule, Rfl: 1   prochlorperazine (COMPAZINE) 10 MG tablet, Take 1 tablet (10 mg total) by mouth every 6 (six) hours as needed for nausea or vomiting., Disp: 30 tablet, Rfl: 3   tirzepatide (MOUNJARO) 10 MG/0.5ML Pen, Inject 10 mg into the skin once a week., Disp: 6 mL, Rfl: 1   TRUE METRIX BLOOD GLUCOSE TEST test strip, TEST BLOOD  SUGAR TWICE DAILY, Disp: 200 strip, Rfl: 3   losartan (COZAAR) 100 MG tablet, Take 100 mg by mouth daily. (Patient not taking: Reported on 02/01/2023), Disp: , Rfl:  No current facility-administered medications for this visit.  Facility-Administered Medications Ordered in Other Visits:    fluorouracil (ADRUCIL) 5,000 mg in sodium chloride 0.9 % 150 mL chemo infusion, 1,920 mg/m2 (Treatment Plan Recorded), Intravenous, 1 day or 1 dose, Derek Jack, MD, Infusion Verify at 02/03/23 1626   Allergies: Allergies  Allergen Reactions   Contrast Media [Iodinated Contrast Media] Other (See Comments)    Burning   Sulfa Antibiotics Nausea And Vomiting    Other reaction(s): GI Upset (intolerance) unknown   Doxepin Rash and Swelling   Tape Rash    REVIEW OF SYSTEMS:   Review of Systems  Constitutional:  Positive for fatigue. Negative for chills and fever.  HENT:   Negative for lump/mass, mouth sores, nosebleeds, sore throat and trouble swallowing.   Eyes:  Negative for eye problems.  Respiratory:  Negative for cough and shortness of breath.   Cardiovascular:  Negative for chest pain, leg swelling and palpitations.  Gastrointestinal:  Positive for blood in stool and diarrhea. Negative for abdominal pain, constipation, nausea and vomiting.  Genitourinary:  Negative for bladder incontinence, difficulty urinating, dysuria, frequency, hematuria and nocturia.   Musculoskeletal:  Negative for arthralgias, back pain, flank pain, myalgias and neck pain.  Skin:  Negative for itching and  rash.  Neurological:  Positive for numbness. Negative for dizziness and headaches.  Hematological:  Does not bruise/bleed easily.  Psychiatric/Behavioral:  Negative for depression, sleep disturbance and suicidal ideas. The patient is not nervous/anxious.   All other systems reviewed and are negative.    VITALS:   There were no vitals taken for this visit.  Wt Readings from Last 3 Encounters:  02/03/23 274 lb  0.5 oz (124.3 kg)  01/28/23 272 lb 14.9 oz (123.8 kg)  01/27/23 269 lb 6.4 oz (122.2 kg)    There is no height or weight on file to calculate BMI.  Performance status (ECOG): 1 - Symptomatic but completely ambulatory  PHYSICAL EXAM:   Physical Exam Vitals and nursing note reviewed. Exam conducted with a chaperone present.  Constitutional:      Appearance: Normal appearance.  Cardiovascular:     Rate and Rhythm: Normal rate and regular rhythm.     Pulses: Normal pulses.     Heart sounds: Normal heart sounds.  Pulmonary:     Effort: Pulmonary effort is normal.     Breath sounds: Normal breath sounds.  Abdominal:     Palpations: Abdomen is soft. There is no hepatomegaly, splenomegaly or mass.     Tenderness: There is no abdominal tenderness.  Musculoskeletal:     Right lower leg: No edema.     Left lower leg: No edema.  Lymphadenopathy:     Cervical: No cervical adenopathy.     Right cervical: No superficial, deep or posterior cervical adenopathy.    Left cervical: No superficial, deep or posterior cervical adenopathy.     Upper Body:     Right upper body: No supraclavicular or axillary adenopathy.     Left upper body: No supraclavicular or axillary adenopathy.  Neurological:     General: No focal deficit present.     Mental Status: She is alert and oriented to person, place, and time.  Psychiatric:        Mood and Affect: Mood normal.        Behavior: Behavior normal.     LABS:      Latest Ref Rng & Units 02/03/2023    8:36 AM 02/01/2023    9:01 AM 01/29/2023    8:58 AM  CBC  WBC 4.0 - 10.5 K/uL 12.6  1.3  0.6   Hemoglobin 12.0 - 15.0 g/dL 8.6  7.3  7.6   Hematocrit 36.0 - 46.0 % 26.4  23.1  24.1   Platelets 150 - 400 K/uL 230  112  83       Latest Ref Rng & Units 02/03/2023    8:36 AM 01/28/2023    1:26 AM 01/27/2023   10:37 AM  CMP  Glucose 70 - 99 mg/dL 139  139  199   BUN 8 - 23 mg/dL '15  14  14   '$ Creatinine 0.44 - 1.00 mg/dL 1.62  0.85  0.90   Sodium 135 -  145 mmol/L 135  134  135   Potassium 3.5 - 5.1 mmol/L 2.8  3.8  4.0   Chloride 98 - 111 mmol/L 103  105  104   CO2 22 - 32 mmol/L '21  22  21   '$ Calcium 8.9 - 10.3 mg/dL 8.3  7.9  8.4   Total Protein 6.5 - 8.1 g/dL 6.0   6.0   Total Bilirubin 0.3 - 1.2 mg/dL 1.0   0.9   Alkaline Phos 38 - 126 U/L 58   43  AST 15 - 41 U/L 23   44   ALT 0 - 44 U/L 17   23      Lab Results  Component Value Date   CEA1 3.8 12/16/2022   /  CEA  Date Value Ref Range Status  12/16/2022 3.8 0.0 - 4.7 ng/mL Final    Comment:    (NOTE)                             Nonsmokers          <3.9                             Smokers             <5.6 Roche [employer] Electrochemiluminescence Immunoassay (ECLIA) Values obtained with different assay methods or kits cannot be used interchangeably.  Results cannot be interpreted as absolute evidence of the presence or absence of malignant disease. Performed At: [facility], [place] [id number] Rush Farmer MD [id number]    No results found for: "PSA1" No results found for: "CAN199" No results found for: "CAN125"  No results found for: "TOTALPROTELP", "ALBUMINELP", "A1GS", "A2GS", "BETS", "BETA2SER", "GAMS", "MSPIKE", "SPEI" Lab Results  Component Value Date   TIBC 370 01/04/2023   FERRITIN 4 (L) 01/04/2023   IRONPCTSAT 4 (L) 01/04/2023   Lab Results  Component Value Date   LDH 136 01/04/2023     STUDIES:   IR IMAGING GUIDED PORT INSERTION  Result Date: 01/15/2023 INDICATION: Chemotherapy EXAM: Chest port placement using ultrasound and fluoroscopic guidance MEDICATIONS: Documented in the EMR ANESTHESIA/SEDATION: Moderate (conscious) sedation was employed during this procedure. A total of Versed 1.5 mg and Fentanyl 75 mcg was administered intravenously. Moderate Sedation Time: 30 minutes. The patient's level of consciousness and vital signs were monitored continuously by radiology nursing throughout the procedure under  my direct supervision. FLUOROSCOPY TIME:  Fluoroscopy Time: 0.4 minutes (16 mGy) COMPLICATIONS: None immediate. PROCEDURE: Informed written consent was obtained from the patient after a thorough discussion of the procedural risks, benefits and alternatives. All questions were addressed. Maximal Sterile Barrier Technique was utilized including caps, mask, sterile gowns, sterile gloves, sterile drape, hand hygiene and skin antiseptic. A timeout was performed prior to the initiation of the procedure. The patient was placed supine on the exam table. The right neck and chest was prepped and draped in the standard sterile fashion. A preliminary ultrasound of the right neck was performed and demonstrates a patent right internal jugular vein. A permanent ultrasound image was stored in the electronic medical record. The overlying skin was anesthetized with 1% Lidocaine. Using ultrasound guidance, access was obtained into the right internal jugular vein using a 21 gauge micropuncture set. A wire was advanced into the SVC, a short incision was made at the puncture site, and serial dilatation performed. Next, in an ipsilateral infraclavicular location, an incision was made at the site of the subcutaneous reservoir. Blunt dissection was used to open a pocket to contain the reservoir. A subcutaneous tunnel was then created from the port site to the puncture site. A(n) 8 Fr single lumen catheter was advanced through the tunnel. The catheter was attached to the port and this was placed in the subcutaneous pocket. Under fluoroscopic guidance, a peel away sheath was placed, and the catheter was trimmed to the appropriate length and was advanced into the central veins. The  catheter length is 21 cm. The tip of the catheter lies near the superior cavoatrial junction. The port flushes and aspirates appropriately. The port was flushed and locked with heparinized saline. The port pocket was closed in 2 layers using 3-0 and 4-0  Vicryl/absorbable suture. Dermabond was also applied to both incisions. The patient tolerated the procedure well and was transferred to recovery in stable condition. IMPRESSION: Successful placement of a right-sided chest port via the right internal jugular vein. The port is ready for immediate use. Electronically Signed   By: Albin Felling M.D.   On: 01/15/2023 14:53

## 2023-02-03 NOTE — Patient Instructions (Signed)
[facility]  Discharge Instructions: Thank you for choosing [facility] to provide your oncology and hematology care.  If you have a lab appointment with the [facility], please come in thru the Main Entrance and check in at the main information desk.  Wear comfortable clothing and clothing appropriate for easy access to any Portacath or PICC line.   We strive to give you quality time with your provider. You may need to reschedule your appointment if you arrive late (15 or more minutes).  Arriving late affects you and other patients whose appointments are after yours.  Also, if you miss three or more appointments without notifying the office, you may be dismissed from the clinic at the provider's discretion.      For prescription refill requests, have your pharmacy contact our office and allow 72 hours for refills to be completed.    Today you received the following chemotherapy and/or immunotherapy agents Folfirinox.       To help prevent nausea and vomiting after your treatment, we encourage you to take your nausea medication as directed.  BELOW ARE SYMPTOMS THAT SHOULD BE REPORTED IMMEDIATELY: *FEVER GREATER THAN 100.4 F (38 C) OR HIGHER *CHILLS OR SWEATING *NAUSEA AND VOMITING THAT IS NOT CONTROLLED WITH YOUR NAUSEA MEDICATION *UNUSUAL SHORTNESS OF BREATH *UNUSUAL BRUISING OR BLEEDING *URINARY PROBLEMS (pain or burning when urinating, or frequent urination) *BOWEL PROBLEMS (unusual diarrhea, constipation, pain near the anus) TENDERNESS IN MOUTH AND THROAT WITH OR WITHOUT PRESENCE OF ULCERS (sore throat, sores in mouth, or a toothache) UNUSUAL RASH, SWELLING OR PAIN  UNUSUAL VAGINAL DISCHARGE OR ITCHING   Items with * indicate a potential emergency and should be followed up as soon as possible or go to the Emergency Department if any problems should occur.  Please show the CHEMOTHERAPY ALERT CARD or IMMUNOTHERAPY ALERT CARD at check-in to the  Emergency Department and triage nurse.  Should you have questions after your visit or need to cancel or reschedule your appointment, please contact [facility] [phone]  and follow the prompts.  Office hours are 8:00 a.m. to 4:30 p.m. Monday - Friday. Please note that voicemails left after 4:00 p.m. may not be returned until the following business day.  We are closed weekends and major holidays. You have access to a nurse at all times for urgent questions. Please call the main number to the clinic [phone] and follow the prompts.  For any non-urgent questions, you may also contact your provider using MyChart. We now offer e-Visits for anyone 46 and older to request care online for non-urgent symptoms. For details visit [email or web address].   Also download the MyChart app! Go to the app store, search "MyChart", open the app, select [facility], and log in with your MyChart username and password.

## 2023-02-05 ENCOUNTER — Inpatient Hospital Stay: Payer: Medicare HMO

## 2023-02-05 VITALS — BP 118/47 | HR 85 | Temp 97.2°F | Resp 18

## 2023-02-05 DIAGNOSIS — C20 Malignant neoplasm of rectum: Secondary | ICD-10-CM

## 2023-02-05 DIAGNOSIS — Z5111 Encounter for antineoplastic chemotherapy: Secondary | ICD-10-CM | POA: Diagnosis not present

## 2023-02-05 DIAGNOSIS — D509 Iron deficiency anemia, unspecified: Secondary | ICD-10-CM

## 2023-02-05 MED ORDER — MAGNESIUM SULFATE 2 GM/50ML IV SOLN
2.0000 g | Freq: Once | INTRAVENOUS | Status: AC
  Administered 2023-02-05: 2 g via INTRAVENOUS
  Filled 2023-02-05: qty 50

## 2023-02-05 MED ORDER — SODIUM CHLORIDE 0.9% FLUSH
10.0000 mL | INTRAVENOUS | Status: DC | PRN
  Administered 2023-02-05: 10 mL

## 2023-02-05 MED ORDER — POTASSIUM CHLORIDE IN NACL 20-0.9 MEQ/L-% IV SOLN
Freq: Once | INTRAVENOUS | Status: AC
  Filled 2023-02-05: qty 1000

## 2023-02-05 MED ORDER — HEPARIN SOD (PORK) LOCK FLUSH 100 UNIT/ML IV SOLN
500.0000 [IU] | Freq: Once | INTRAVENOUS | Status: AC | PRN
  Administered 2023-02-05: 500 [IU]

## 2023-02-05 MED ORDER — PEGFILGRASTIM-CBQV 6 MG/0.6ML ~~LOC~~ SOSY
6.0000 mg | PREFILLED_SYRINGE | Freq: Once | SUBCUTANEOUS | Status: AC
  Administered 2023-02-05: 6 mg via SUBCUTANEOUS
  Filled 2023-02-05: qty 0.6

## 2023-02-05 NOTE — Progress Notes (Signed)
Patient presents today for pump d/c and Hydration fluids x 2 hours. ( House fluid) Patient has no complaints of any side effects related to treatment. Appointment schedule printed and given to patient and understanding verified of upcoming appointment times.   Patient presents today for pump d/c. Vital signs are stable. Port a cath site clean, dry, and intact. Port flushed with 10 mls of Normal Saline and 500 Units of Heparin. Needle removed intact. Band aid applied. Patient has no complaints at this time. Discharged from clinic by wheel chair and in stable condition. Patient alert and oriented.

## 2023-02-08 ENCOUNTER — Other Ambulatory Visit: Payer: Self-pay

## 2023-02-08 ENCOUNTER — Other Ambulatory Visit: Payer: Self-pay | Admitting: *Deleted

## 2023-02-08 ENCOUNTER — Inpatient Hospital Stay: Payer: Medicare HMO

## 2023-02-08 VITALS — BP 137/87 | HR 91 | Temp 98.8°F | Resp 18 | Wt 268.3 lb

## 2023-02-08 DIAGNOSIS — Z5111 Encounter for antineoplastic chemotherapy: Secondary | ICD-10-CM | POA: Diagnosis not present

## 2023-02-08 DIAGNOSIS — C20 Malignant neoplasm of rectum: Secondary | ICD-10-CM

## 2023-02-08 DIAGNOSIS — D509 Iron deficiency anemia, unspecified: Secondary | ICD-10-CM

## 2023-02-08 LAB — CBC WITH DIFFERENTIAL/PLATELET
Abs Immature Granulocytes: 0.1 10*3/uL — ABNORMAL HIGH (ref 0.00–0.07)
Band Neutrophils: 2 %
Basophils Absolute: 0 10*3/uL (ref 0.0–0.1)
Basophils Relative: 0 %
Eosinophils Absolute: 0 10*3/uL (ref 0.0–0.5)
Eosinophils Relative: 0 %
HCT: 25.2 % — ABNORMAL LOW (ref 36.0–46.0)
Hemoglobin: 8 g/dL — ABNORMAL LOW (ref 12.0–15.0)
Lymphocytes Relative: 57 %
Lymphs Abs: 0.9 10*3/uL (ref 0.7–4.0)
MCH: 26.4 pg (ref 26.0–34.0)
MCHC: 31.7 g/dL (ref 30.0–36.0)
MCV: 83.2 fL (ref 80.0–100.0)
Metamyelocytes Relative: 3 %
Monocytes Absolute: 0 10*3/uL — ABNORMAL LOW (ref 0.1–1.0)
Monocytes Relative: 1 %
Myelocytes: 1 %
Neutro Abs: 0.6 10*3/uL — ABNORMAL LOW (ref 1.7–7.7)
Neutrophils Relative %: 36 %
Platelets: 164 10*3/uL (ref 150–400)
RBC: 3.03 MIL/uL — ABNORMAL LOW (ref 3.87–5.11)
RDW: 20.4 % — ABNORMAL HIGH (ref 11.5–15.5)
WBC: 1.5 10*3/uL — ABNORMAL LOW (ref 4.0–10.5)
nRBC: 0 % (ref 0.0–0.2)

## 2023-02-08 LAB — SAMPLE TO BLOOD BANK

## 2023-02-08 MED ORDER — LORATADINE 10 MG PO TABS: 10.0000 mg | ORAL_TABLET | Freq: Once | ORAL | Status: DC

## 2023-02-08 MED ORDER — CETIRIZINE HCL 10 MG PO TABS
10.0000 mg | ORAL_TABLET | Freq: Once | ORAL | Status: AC
  Administered 2023-02-08: 10 mg via ORAL
  Filled 2023-02-08: qty 1

## 2023-02-08 MED ORDER — SODIUM CHLORIDE 0.9% FLUSH
10.0000 mL | Freq: Once | INTRAVENOUS | Status: AC | PRN
  Administered 2023-02-08: 10 mL

## 2023-02-08 MED ORDER — ALTEPLASE 2 MG IJ SOLR: 2.0000 mg | Freq: Once | INTRAMUSCULAR | Status: DC | PRN

## 2023-02-08 MED ORDER — ACETAMINOPHEN 325 MG PO TABS
650.0000 mg | ORAL_TABLET | Freq: Once | ORAL | Status: AC
  Administered 2023-02-08: 650 mg via ORAL
  Filled 2023-02-08: qty 2

## 2023-02-08 MED ORDER — LOPERAMIDE HCL 2 MG PO CAPS: 2.0000 mg | ORAL_CAPSULE | ORAL | 0 refills | Status: DC | PRN

## 2023-02-08 MED ORDER — HEPARIN SOD (PORK) LOCK FLUSH 100 UNIT/ML IV SOLN
500.0000 [IU] | Freq: Once | INTRAVENOUS | Status: AC | PRN
  Administered 2023-02-08: 500 [IU]

## 2023-02-08 MED ORDER — SODIUM CHLORIDE 0.9 % IV SOLN: Freq: Once | INTRAVENOUS | Status: AC

## 2023-02-08 MED ORDER — SODIUM CHLORIDE 0.9 % IV SOLN
400.0000 mg | Freq: Once | INTRAVENOUS | Status: AC
  Administered 2023-02-08: 400 mg via INTRAVENOUS
  Filled 2023-02-08: qty 20

## 2023-02-08 NOTE — Progress Notes (Signed)
Pharmacy has substituted cetirizine 10 mg orally x 1 as premedication for   Loratidine discontinued.  V.O. Rebekah Pennington, PA-C/Thresa Dozier, PharmD  

## 2023-02-08 NOTE — Progress Notes (Signed)
Patient presents today for iron infusion.  Patient is in stable condition with only complaints of weakness/fatigue and dizziness. Vital signs are stable.  Patient has not had Hgb checked since 03/13.  I spoke with Dr. Delton Coombes and we will draw CBCD today prior to Venofer.    Hemoglobin today is 8.0.  No need for blood products.  We will proceed with iron infusion per provider orders.  Patient tolerated treatment well with no complaints voiced.  Patient left via wheelchair with daughter in stable condition.  Vital signs stable at discharge.  Follow up as scheduled.

## 2023-02-08 NOTE — Patient Instructions (Signed)
[facility]  Discharge Instructions: Thank you for choosing [facility] to provide your oncology and hematology care.  If you have a lab appointment with the Cancer Center, please come in thru the Main Entrance and check in at the main information desk.  Wear comfortable clothing and clothing appropriate for easy access to any Portacath or PICC line.   We strive to give you quality time with your provider. You may need to reschedule your appointment if you arrive late (15 or more minutes).  Arriving late affects you and other patients whose appointments are after yours.  Also, if you miss three or more appointments without notifying the office, you may be dismissed from the clinic at the provider's discretion.      For prescription refill requests, have your pharmacy contact our office and allow 72 hours for refills to be completed.    Iron Sucrose Injection What is this medication? IRON SUCROSE (EYE ern SOO krose) treats low levels of iron (iron deficiency anemia) in people with kidney disease. Iron is a mineral that plays an important role in making red blood cells, which carry oxygen from your lungs to the rest of your body. This medicine may be used for other purposes; ask your health care provider or pharmacist if you have questions. COMMON BRAND NAME(S): Venofer What should I tell my care team before I take this medication? They need to know if you have any of these conditions: Anemia not caused by low iron levels Heart disease High levels of iron in the blood Kidney disease Liver disease An unusual or allergic reaction to iron, other medications, foods, dyes, or preservatives Pregnant or trying to get pregnant Breastfeeding How should I use this medication? This medication is for infusion into a vein. It is given in a hospital or clinic setting. Talk to your care team about the use of this medication in children. While this medication may be  prescribed for children as young as 2 years for selected conditions, precautions do apply. Overdosage: If you think you have taken too much of this medicine contact a poison control center or emergency room at once. NOTE: This medicine is only for you. Do not share this medicine with others. What if I miss a dose? Keep appointments for follow-up doses. It is important not to miss your dose. Call your care team if you are unable to keep an appointment. What may interact with this medication? Do not take this medication with any of the following: Deferoxamine Dimercaprol Other iron products This medication may also interact with the following: Chloramphenicol Deferasirox This list may not describe all possible interactions. Give your health care provider a list of all the medicines, herbs, non-prescription drugs, or dietary supplements you use. Also tell them if you smoke, drink alcohol, or use illegal drugs. Some items may interact with your medicine. What should I watch for while using this medication? Visit your care team regularly. Tell your care team if your symptoms do not start to get better or if they get worse. You may need blood work done while you are taking this medication. You may need to follow a special diet. Talk to your care team. Foods that contain iron include: whole grains/cereals, dried fruits, beans, or peas, leafy green vegetables, and organ meats (liver, kidney). What side effects may I notice from receiving this medication? Side effects that you should report to your care team as soon as possible: Allergic reactions--skin rash, itching, hives,   swelling of the face, lips, tongue, or throat Low blood pressure--dizziness, feeling faint or lightheaded, blurry vision Shortness of breath Side effects that usually do not require medical attention (report to your care team if they continue or are bothersome): Flushing Headache Joint pain Muscle pain Nausea Pain, redness, or  irritation at injection site This list may not describe all possible side effects. Call your doctor for medical advice about side effects. You may report side effects to FDA at 1-800-FDA-1088. Where should I keep my medication? This medication is given in a hospital or clinic and will not be stored at home. NOTE: This sheet is a summary. It may not cover all possible information. If you have questions about this medicine, talk to your doctor, pharmacist, or health care provider.  2023 Elsevier/Gold Standard (2021-02-20 00:00:00)    To help prevent nausea and vomiting after your treatment, we encourage you to take your nausea medication as directed.  BELOW ARE SYMPTOMS THAT SHOULD BE REPORTED IMMEDIATELY: *FEVER GREATER THAN 100.4 F (38 C) OR HIGHER *CHILLS OR SWEATING *NAUSEA AND VOMITING THAT IS NOT CONTROLLED WITH YOUR NAUSEA MEDICATION *UNUSUAL SHORTNESS OF BREATH *UNUSUAL BRUISING OR BLEEDING *URINARY PROBLEMS (pain or burning when urinating, or frequent urination) *BOWEL PROBLEMS (unusual diarrhea, constipation, pain near the anus) TENDERNESS IN MOUTH AND THROAT WITH OR WITHOUT PRESENCE OF ULCERS (sore throat, sores in mouth, or a toothache) UNUSUAL RASH, SWELLING OR PAIN  UNUSUAL VAGINAL DISCHARGE OR ITCHING   Items with * indicate a potential emergency and should be followed up as soon as possible or go to the Emergency Department if any problems should occur.  Please show the CHEMOTHERAPY ALERT CARD or IMMUNOTHERAPY ALERT CARD at check-in to the Emergency Department and triage nurse.  Should you have questions after your visit or need to cancel or reschedule your appointment, please contact [facility] [phone]  and follow the prompts.  Office hours are 8:00 a.m. to 4:30 p.m. Monday - Friday. Please note that voicemails left after 4:00 p.m. may not be returned until the following business day.  We are closed weekends and major holidays. You have access to  a nurse at all times for urgent questions. Please call the main number to the clinic [phone] and follow the prompts.  For any non-urgent questions, you may also contact your provider using MyChart. We now offer e-Visits for anyone 18 and older to request care online for non-urgent symptoms. For details visit [email or web address].   Also download the MyChart app! Go to the app store, search "MyChart", open the app, select [facility], and log in with your MyChart username and password.   

## 2023-02-09 NOTE — Progress Notes (Unsigned)
[address] [place] [address] Phone: [phone] Fax: [phone]  SYMPTOM MANAGEMENT CLINIC PROGRESS NOTE   CEDAR GIGLIO CP:[id number] 05/20/1956 67 y.o.  [name] is managed by Dr. Delton Coombes for her stage IIc rectal cancer   Actively treated with chemotherapy/immunotherapy/hormonal therapy: YES   Current therapy: FOLFIRINOX   Last treated: C2/D1 02/03/2023  INTERVAL HISTORY:  Chief Complaint: Chemotherapy follow-up & symptom management visit  [name] did fairly well after her second cycle of chemotherapy until yesterday.  Over the last 24 to 48 hours, she reports onset of nausea and vomiting, has been unable to keep down food, liquid, or medication.  She continues to have diarrhea (4-5 watery bowel movements daily).  She was eating about 25% of her normal food intake until yesterday, reports that she has had "nothing to eat" yesterday or today.  She has been able to keep down about 20 ounces of water in the past 24 hours.  Her weight today is 259 pounds (down 15 pounds in the past week).  She has a stable diabetic peripheral neuropathy, reports onset of cold sensitivity after her second cycle of oxaliplatin.  She also reports some cramping in her left toes over the past 2 to 3 days (possibly secondary to electrolyte disturbances).  She denies any significant pelvic or rectal pain this week.  She has prescription for hydrocodone available  She denies any major rectal bleeding this week, has only noticed "a few streaks" when she wipes.  She reports ice pica.  She has mild sore on her right upper gumline for the past 2 to 3 days, with some mild pain with swallowing.   PERTINENT NEGATIVES: No changes in urine, rash/skin changes, peripheral edema, chest pain, shortness of breath, dyspnea.   ASSESSMENT & PLAN:  ## STAGE IIC RECTAL CANCER - Primary oncologist is Dr. Delton Coombes - C2/D1 of FOLFIRINOX on 02/03/2023 with pump D/C on  02/05/2023 - She received Udenyca on 02/05/2023 - PLAN: Next scheduled visit with medical oncologist (Dr. Delton Coombes) on 02/17/2023 for cycle #3 of chemotherapy  # Nausea and vomiting # Hypokalemia (secondary to the above) # Hypomagnesemia (secondary to the above) # Dehydration (secondary to the above) # Weight loss and decreased appetite (secondary to the above) - Nausea and vomiting over the last 24-48 hours, unable to keep down food, liquid, or medication. - Eating about 25% of her normal food intake until yesterday, reports that she has had "nothing to eat" yesterday or today. - She has been able to keep down about 20 ounces of water in the past 24 hours. - Weight today is 259 pounds (down 15 pounds in the past week). - Labs today (02/11/2023) showed magnesium 1.3, potassium 2.6.  Likely secondary to GI losses. - PLAN: IV fluids and electrolytes to be given in clinic today with repeat labs and possible fluids tomorrow and Monday (02/15/2023)  NAUSEA: IV Compazine given in clinic.  Recommend oral Compazine 10 mg every 6 hours (scheduled), with 8 mg ODT Zofran for breakthrough nausea. HYPOKALEMIA: 40 mEq IV potassium given in clinic.  Will see if she can tolerate additional 40 mEq p.o. potassium after nausea medication has been given.  Prescription sent to pharmacy for potassium 20 mEq twice daily. HYPOMAGNESIA: 4 g IV magnesium given in clinic.  Prescription sent to pharmacy for magnesium 400 mg twice daily. DEHYDRATION: 1 L NS given today in clinic.  Repeat tomorrow if needed.  Encourage increased fluid intake as  tolerated after optimization of nausea/vomiting. WEIGHT LOSS: Increase caloric intake as tolerated with optimization of nausea/vomiting.  Continue close follow-up with nutritionist.  # Mouth sore - Aphthous ulcer at right upper gumline x 2 to 3 days.  Mild pain with swallowing. - No apparent thrush on exam. - PLAN: Prescription to pharmacy for Carafate 1 g per 10 mL with one-to-one  mixture with viscous lidocaine 2%.  Swish and swallow 1 tablespoon 4 times daily.  # Diarrhea - Persistent diarrhea since second cycle of chemotherapy, reports 4-5 watery bowel movements daily - Using Imodium cautiously with goal to maintain soft bowel movement consistency.  Avoiding overuse of Imodium since hard/formed bowel movements place her at increased risk of rectal bleeding. - PLAN: We will cautiously increase Imodium.  Patient instructed to take 2 mg Imodium after second watery bowel movement each day.  She can take additional 2 mg dose of Imodium after fourth watery bowel movement of each day.  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   - She has been scheduled for IV Venofer 400 mg x 3 (received 2/3 doses to date) - Hgb today (02/11/2023) 7.5 - PLAN: We will transfuse 1 unit PRBC today. - Continue to closely monitor CBC with blood transfusions as needed - She is scheduled for Venofer 400 mg on 02/15/2023  # Diabetic peripheral neuropathy - She has no feeling in her toes.  No worsening since oxaliplatin started, but reports onset of cold sensitivity after second cycle of chemotherapy.   - PLAN: Continue Lyrica twice daily.  # Chemotherapy-induced pancytopenia - CBC today (02/11/2023): Hgb 7.5, platelets 125, WBC 1.2/ANC 0.6/ALC 0.5 - Patient does also have underlying liver cirrhosis, but has previously had normal WBCs and platelets. - She received Udenyca on 02/05/2023 - PLAN: Neutropenic and infection prevention education given to patient.   # Pelvic pain -  Intermittent pelvic pain, usually 3/10 to 5/10, but with some severe pelvic pain yesterday at 10/10 - PLAN: Continue hydrocodone 5 mg to be taken 1 tablet every 4-6 hours as needed for severe pain.  (Patient also takes Flexeril at home, advised that she should NOT take these  medications at the same time, and to pay close attention for any symptoms of CNS or respiratory depression)  # Folic acid deficiency - PLAN: Continue folic acid 1 mg tablet daily  PLAN SUMMARY: >> IV fluids and blood transfusion today as above >> Labs (CBC/D, BB sample, CMP, magnesium) tomorrow + possible fluids tomorrow >> Next scheduled appointment with main oncologist: 02/17/2023 (cycle #3 chemotherapy)   REVIEW OF SYSTEMS:   Review of Systems  Constitutional:  Positive for activity change, appetite change, fatigue and unexpected weight change. Negative for chills, diaphoresis and fever.  HENT:  Positive for mouth sores. Negative for nosebleeds, sore throat and trouble swallowing.   Respiratory:  Negative for cough and shortness of breath.   Cardiovascular:  Negative for chest pain, palpitations and leg swelling.  Gastrointestinal:  Positive for diarrhea, nausea and vomiting. Negative for abdominal pain, blood in stool and constipation.  Genitourinary:  Negative for dysuria and hematuria.  Neurological:  Negative for dizziness, light-headedness, numbness and headaches.  Psychiatric/Behavioral:  Positive for sleep disturbance. Negative for dysphoric mood. The patient is not nervous/anxious.     Past Medical History, Surgical history, Social history, and Family history were reviewed as documented elsewhere  in chart, and were updated as appropriate.   OBJECTIVE:  Physical Exam:  There were no vitals taken for this visit. ECOG: 3  Physical Exam Constitutional:      Appearance: Normal appearance. She is morbidly obese.     Comments: Somewhat weak appearing on exam  HENT:     Mouth/Throat:     Comments: Small aphthous ulcer at right upper gumline Cardiovascular:     Rate and Rhythm: Tachycardia present.     Heart sounds: Murmur heard.  Pulmonary:     Breath sounds: Normal breath sounds.  Skin:    General: Skin is warm and dry.     Coloration: Skin is pale.     Comments: Poor  turgor  Neurological:     General: No focal deficit present.     Mental Status: Mental status is at baseline.  Psychiatric:        Behavior: Behavior normal. Behavior is cooperative.    Lab Review:     Component Value Date/Time   NA 135 02/03/2023 0836   NA 139 09/03/2022 1305   K 2.8 (L) 02/03/2023 0836   CL 103 02/03/2023 0836   CO2 21 (L) 02/03/2023 0836   GLUCOSE 139 (H) 02/03/2023 0836   BUN 15 02/03/2023 0836   BUN 8 09/03/2022 1305   CREATININE 1.62 (H) 02/03/2023 0836   CREATININE 0.84 01/26/2023 1503   CALCIUM 8.3 (L) 02/03/2023 0836   PROT 6.0 (L) 02/03/2023 0836   PROT 6.6 09/03/2022 1305   ALBUMIN 2.5 (L) 02/03/2023 0836   ALBUMIN 3.7 (L) 09/03/2022 1305   AST 23 02/03/2023 0836   ALT 17 02/03/2023 0836   ALKPHOS 58 02/03/2023 0836   BILITOT 1.0 02/03/2023 0836   BILITOT 0.3 09/03/2022 1305   GFRNONAA 35 (L) 02/03/2023 0836   GFRNONAA 78 12/14/2019 0848   GFRAA 81 12/11/2020 0908   GFRAA 90 12/14/2019 0848       Component Value Date/Time   WBC 1.5 (L) 02/08/2023 1054   RBC 3.03 (L) 02/08/2023 1054   HGB 8.0 (L) 02/08/2023 1054   HGB 8.6 (L) 11/26/2022 1343   HCT 25.2 (L) 02/08/2023 1054   HCT 30.5 (L) 11/26/2022 1343   PLT 164 02/08/2023 1054   PLT 199 11/26/2022 1343   MCV 83.2 02/08/2023 1054   MCV 76 (L) 11/26/2022 1343   MCH 26.4 02/08/2023 1054   MCHC 31.7 02/08/2023 1054   RDW 20.4 (H) 02/08/2023 1054   RDW 16.6 (H) 11/26/2022 1343   LYMPHSABS 0.9 02/08/2023 1054   MONOABS 0.0 (L) 02/08/2023 1054   EOSABS 0.0 02/08/2023 1054   BASOSABS 0.0 02/08/2023 1054   -------------------------------  Imaging from last 24 hours (if applicable): Radiology interpretation: IR IMAGING GUIDED PORT INSERTION  Result Date: 01/15/2023 INDICATION: Chemotherapy EXAM: Chest port placement using ultrasound and fluoroscopic guidance MEDICATIONS: Documented in the EMR ANESTHESIA/SEDATION: Moderate (conscious) sedation was employed during this procedure. A total  of Versed 1.5 mg and Fentanyl 75 mcg was administered intravenously. Moderate Sedation Time: 30 minutes. The patient's level of consciousness and vital signs were monitored continuously by radiology nursing throughout the procedure under my direct supervision. FLUOROSCOPY TIME:  Fluoroscopy Time: 0.4 minutes (16 mGy) COMPLICATIONS: None immediate. PROCEDURE: Informed written consent was obtained from the patient after a thorough discussion of the procedural risks, benefits and alternatives. All questions were addressed. Maximal Sterile Barrier Technique was utilized including caps, mask, sterile gowns, sterile gloves, sterile drape, hand hygiene and skin antiseptic. A timeout was  performed prior to the initiation of the procedure. The patient was placed supine on the exam table. The right neck and chest was prepped and draped in the standard sterile fashion. A preliminary ultrasound of the right neck was performed and demonstrates a patent right internal jugular vein. A permanent ultrasound image was stored in the electronic medical record. The overlying skin was anesthetized with 1% Lidocaine. Using ultrasound guidance, access was obtained into the right internal jugular vein using a 21 gauge micropuncture set. A wire was advanced into the SVC, a short incision was made at the puncture site, and serial dilatation performed. Next, in an ipsilateral infraclavicular location, an incision was made at the site of the subcutaneous reservoir. Blunt dissection was used to open a pocket to contain the reservoir. A subcutaneous tunnel was then created from the port site to the puncture site. A(n) 8 Fr single lumen catheter was advanced through the tunnel. The catheter was attached to the port and this was placed in the subcutaneous pocket. Under fluoroscopic guidance, a peel away sheath was placed, and the catheter was trimmed to the appropriate length and was advanced into the central veins. The catheter length is 21 cm.  The tip of the catheter lies near the superior cavoatrial junction. The port flushes and aspirates appropriately. The port was flushed and locked with heparinized saline. The port pocket was closed in 2 layers using 3-0 and 4-0 Vicryl/absorbable suture. Dermabond was also applied to both incisions. The patient tolerated the procedure well and was transferred to recovery in stable condition. IMPRESSION: Successful placement of a right-sided chest port via the right internal jugular vein. The port is ready for immediate use. Electronically Signed   By: Albin Felling M.D.   On: 01/15/2023 14:53      WRAP UP:  All questions were answered. The patient knows to call the clinic with any problems, questions or concerns.  Medical decision making: High  Time spent on visit: I spent 45 minutes counseling the patient face to face. The total time spent in the appointment was 60 minutes and more than 50% was on counseling.  Carrie Rush, PA-C  02/11/2023 12:03 PM

## 2023-02-11 ENCOUNTER — Inpatient Hospital Stay: Payer: Medicare HMO

## 2023-02-11 ENCOUNTER — Inpatient Hospital Stay (HOSPITAL_BASED_OUTPATIENT_CLINIC_OR_DEPARTMENT_OTHER): Payer: Medicare HMO | Admitting: Physician Assistant

## 2023-02-11 ENCOUNTER — Other Ambulatory Visit: Payer: Self-pay

## 2023-02-11 VITALS — BP 129/71 | HR 85 | Temp 99.0°F | Resp 18

## 2023-02-11 DIAGNOSIS — C20 Malignant neoplasm of rectum: Secondary | ICD-10-CM | POA: Diagnosis not present

## 2023-02-11 DIAGNOSIS — K1379 Other lesions of oral mucosa: Secondary | ICD-10-CM

## 2023-02-11 DIAGNOSIS — T451X5A Adverse effect of antineoplastic and immunosuppressive drugs, initial encounter: Secondary | ICD-10-CM

## 2023-02-11 DIAGNOSIS — G893 Neoplasm related pain (acute) (chronic): Secondary | ICD-10-CM

## 2023-02-11 DIAGNOSIS — D62 Acute posthemorrhagic anemia: Secondary | ICD-10-CM

## 2023-02-11 DIAGNOSIS — E876 Hypokalemia: Secondary | ICD-10-CM | POA: Diagnosis not present

## 2023-02-11 DIAGNOSIS — Z5111 Encounter for antineoplastic chemotherapy: Secondary | ICD-10-CM | POA: Diagnosis not present

## 2023-02-11 DIAGNOSIS — K521 Toxic gastroenteritis and colitis: Secondary | ICD-10-CM

## 2023-02-11 DIAGNOSIS — R112 Nausea with vomiting, unspecified: Secondary | ICD-10-CM | POA: Diagnosis not present

## 2023-02-11 DIAGNOSIS — D61810 Antineoplastic chemotherapy induced pancytopenia: Secondary | ICD-10-CM

## 2023-02-11 LAB — CBC WITH DIFFERENTIAL/PLATELET
Abs Immature Granulocytes: 0 10*3/uL (ref 0.00–0.07)
Band Neutrophils: 4 %
Basophils Absolute: 0 10*3/uL (ref 0.0–0.1)
Basophils Relative: 0 %
Eosinophils Absolute: 0 10*3/uL (ref 0.0–0.5)
Eosinophils Relative: 0 %
HCT: 23.7 % — ABNORMAL LOW (ref 36.0–46.0)
Hemoglobin: 7.5 g/dL — ABNORMAL LOW (ref 12.0–15.0)
Lymphocytes Relative: 43 %
Lymphs Abs: 0.5 10*3/uL — ABNORMAL LOW (ref 0.7–4.0)
MCH: 25.8 pg — ABNORMAL LOW (ref 26.0–34.0)
MCHC: 31.6 g/dL (ref 30.0–36.0)
MCV: 81.4 fL (ref 80.0–100.0)
Monocytes Absolute: 0.1 10*3/uL (ref 0.1–1.0)
Monocytes Relative: 7 %
Neutro Abs: 0.6 10*3/uL — ABNORMAL LOW (ref 1.7–7.7)
Neutrophils Relative %: 46 %
Platelets: 125 10*3/uL — ABNORMAL LOW (ref 150–400)
RBC: 2.91 MIL/uL — ABNORMAL LOW (ref 3.87–5.11)
RDW: 19.3 % — ABNORMAL HIGH (ref 11.5–15.5)
WBC: 1.2 10*3/uL — CL (ref 4.0–10.5)
nRBC: 0 % (ref 0.0–0.2)

## 2023-02-11 LAB — COMPREHENSIVE METABOLIC PANEL
ALT: 19 U/L (ref 0–44)
AST: 23 U/L (ref 15–41)
Albumin: 3 g/dL — ABNORMAL LOW (ref 3.5–5.0)
Alkaline Phosphatase: 67 U/L (ref 38–126)
Anion gap: 10 (ref 5–15)
BUN: 9 mg/dL (ref 8–23)
CO2: 24 mmol/L (ref 22–32)
Calcium: 8.1 mg/dL — ABNORMAL LOW (ref 8.9–10.3)
Chloride: 104 mmol/L (ref 98–111)
Creatinine, Ser: 0.87 mg/dL (ref 0.44–1.00)
GFR, Estimated: >60 mL/min (ref >60)
Glucose, Bld: 127 mg/dL — ABNORMAL HIGH (ref 70–99)
Potassium: 2.6 mmol/L — CL (ref 3.5–5.1)
Sodium: 138 mmol/L (ref 135–145)
Total Bilirubin: 0.9 mg/dL (ref 0.3–1.2)
Total Protein: 6.1 g/dL — ABNORMAL LOW (ref 6.5–8.1)

## 2023-02-11 LAB — MAGNESIUM: Magnesium: 1.3 mg/dL — ABNORMAL LOW (ref 1.7–2.4)

## 2023-02-11 LAB — PREPARE RBC (CROSSMATCH)

## 2023-02-11 MED ORDER — SUCRALFATE 1 GM/10ML PO SUSP: 1.0000 g | Freq: Four times a day (QID) | ORAL | 2 refills | Status: DC

## 2023-02-11 MED ORDER — ONDANSETRON HCL 4 MG/2ML IJ SOLN: 4.0000 mg | Freq: Once | INTRAMUSCULAR | Status: DC | PRN

## 2023-02-11 MED ORDER — POTASSIUM CHLORIDE CRYS ER 20 MEQ PO TBCR
40.0000 meq | EXTENDED_RELEASE_TABLET | Freq: Once | ORAL | Status: DC
  Filled 2023-02-11: qty 2

## 2023-02-11 MED ORDER — MAGNESIUM OXIDE -MG SUPPLEMENT 400 (240 MG) MG PO TABS: 400.0000 mg | ORAL_TABLET | Freq: Two times a day (BID) | ORAL | 2 refills | Status: DC

## 2023-02-11 MED ORDER — POTASSIUM CHLORIDE CRYS ER 20 MEQ PO TBCR: 20.0000 meq | EXTENDED_RELEASE_TABLET | Freq: Two times a day (BID) | ORAL | 2 refills | Status: DC

## 2023-02-11 MED ORDER — POTASSIUM CHLORIDE IN NACL 40-0.9 MEQ/L-% IV SOLN
500.0000 mL/h | Freq: Once | INTRAVENOUS | Status: AC
  Administered 2023-02-11: 500 mL/h via INTRAVENOUS
  Filled 2023-02-11: qty 1000

## 2023-02-11 MED ORDER — MAGNESIUM SULFATE 2 GM/50ML IV SOLN
2.0000 g | INTRAVENOUS | Status: AC
  Administered 2023-02-11 (×2): 2 g via INTRAVENOUS
  Filled 2023-02-11 (×2): qty 50

## 2023-02-11 MED ORDER — PROCHLORPERAZINE EDISYLATE 10 MG/2ML IJ SOLN
10.0000 mg | Freq: Once | INTRAMUSCULAR | Status: AC
  Administered 2023-02-11: 10 mg via INTRAVENOUS
  Filled 2023-02-11: qty 2

## 2023-02-11 MED ORDER — ONDANSETRON 4 MG PO TBDP: 8.0000 mg | ORAL_TABLET | Freq: Three times a day (TID) | ORAL | 1 refills | Status: DC | PRN

## 2023-02-11 MED ORDER — ACETAMINOPHEN 325 MG PO TABS
650.0000 mg | ORAL_TABLET | Freq: Once | ORAL | Status: AC
  Administered 2023-02-11: 650 mg via ORAL
  Filled 2023-02-11: qty 2

## 2023-02-11 MED ORDER — DIPHENHYDRAMINE HCL 25 MG PO CAPS
25.0000 mg | ORAL_CAPSULE | Freq: Once | ORAL | Status: AC
  Administered 2023-02-11: 25 mg via ORAL
  Filled 2023-02-11: qty 1

## 2023-02-11 MED ORDER — SODIUM CHLORIDE 0.9% FLUSH
10.0000 mL | INTRAVENOUS | Status: DC | PRN
  Administered 2023-02-11: 10 mL

## 2023-02-11 MED ORDER — SODIUM CHLORIDE 0.9% IV SOLUTION
250.0000 mL | Freq: Once | INTRAVENOUS | Status: AC
  Administered 2023-02-11: 250 mL via INTRAVENOUS

## 2023-02-11 NOTE — Progress Notes (Signed)
PLAN SUMMARY: - 1 unit PRBC - 10 mg IV Compazine to be given now + PRN 4 mg IV Zofran as needed breakthrough nausea/vomiting - 40 mEq IV potassium over 2 hours via central line - 4 g IV magnesium - 1 L NS - 40 mEq oral potassium (try this later today after nausea has been treated)   T.O. Tarri Abernethy, PA-C/Mervil Wacker Ronnald Ramp, PharmD

## 2023-02-11 NOTE — Progress Notes (Signed)
Patient presents today for 1 UPRBC and NS40K/Magnesium over 2 hours.  Patient also to receive nausea medication and PO potassium.  Patient states that she doesn't feel like she can tolerated the PO potassium and refused it.  Stable during infusion without adverse affects.  Vital signs stable.  No complaints at this time.  Discharge from clinic ambulatory in stable condition.  Alert and oriented X 3.  Follow up with [facility] as scheduled.

## 2023-02-11 NOTE — Patient Instructions (Addendum)
[name] at [place] **   You were seen today by Tarri Abernethy PA-C for your chemotherapy follow-up and symptom management visit.    NAUSEA & VOMITING Take Compazine 10 mg every 6 hours.  (Take this whether or not you feel nauseous.) New prescription has been sent to your pharmacy for Zofran (ondansetron) 4 mg orally dissolving tablets.  Take TWO tablets (8 mg total) every 8 hours in between Compazine doses as needed for breakthrough nausea/vomiting.  DIARRHEA Take 2 mg Imodium after your second watery bowel movement each day. If you continue to have watery bowel movements, take 2 mg Imodium after your fourth watery bowel movement.  DEHYDRATION & LOW ELECTROLYTES: This is caused by your vomiting, diarrhea, and decreased food and water intake. You were given IV fluids, IV magnesium, and IV potassium in clinic today. We will check labs and schedule you for possible fluids again on Friday (3/22) and Monday (3/25) Prescription sent to your pharmacy for you to start taking the following at home: Potassium chloride 20 mEq twice daily Magnesium oxide 400 mg twice daily Focus on fluids once your nausea and vomiting are under better control. Drink as much water as you are able to, with goal of 64 ounces of water daily. Drink Pedialyte or Gatorade to help replenish your electrolytes.  You can mix this with water.  WEIGHT LOSS: You have lost a significant amount of weight (15 pounds) since your second cycle of chemotherapy was given.  It is extremely important that you start to regain some of the weight you have lost before your next cycle of chemotherapy. Drink Ensure or Boost as tolerated throughout the day. Try to eat small, frequent high-calorie meals.  MOUTH SORE Prescription mouthwash (Carafate + lidocaine) has been sent to your pharmacy.  Swish and swallow 1 tablespoon of this mouthwash 4 times daily.  ANEMIA You  were given 1 blood transfusion today. If you have any recurrent severe rectal bleeding or have any symptoms of chest pain, shortness of breath, passing out, or altered mental status, you should call 911 and go immediately to the emergency department. You are scheduled for IV iron on Monday, 02/15/2023.  LOW WHITE BLOOD CELLS Your low white blood cells are a side effect of your chemotherapy. Make sure that you are avoiding anyone with obvious illness.  Wash your hands frequently and thoroughly wash any fresh fruits or vegetables.  Avoid any raw or undercooked meats and eggs. Call our office IMMEDIATELY if you have any fever or other symptoms of infection.  PELVIC PAIN Prescription has been sent to your pharmacy for hydrocodone 5 mg.  You can take 1 tablet every 4-6 hours as needed for severe pain.  Do not take this medication at the same time as your Flexeril and watch for any symptoms of decreased breathing or alertness.  ** If you have any new or worsening symptoms throughout the duration of your cancer treatment, please do not hesitate to call the [facility] at [phone].  Our triage nurse can assist with many common problems, and same-day symptom management visits are available with Tarri Abernethy PA-C as needed.  FOLLOW-UP APPOINTMENT: Office visit with Dr. Delton Coombes on Wednesday 02/17/2023.  You will also have labs and start cycle #3 of chemotherapy at that time.  ** Thank you for trusting me with your healthcare!  I strive to provide all of my patients with quality care at each visit.  If you receive  a survey for this visit, I would be so grateful to you for taking the time to provide feedback.  Thank you in advance!  ~ Edwyna Dangerfield                   Dr. Derek Jack   &   Tarri Abernethy, PA-C   - - - - - - - - - - - - - - - - - -    Thank you for choosing [place] at [facility] to provide your oncology and hematology care.  To afford each patient  quality time with our provider, please arrive at least 15 minutes before your scheduled appointment time.   If you have a lab appointment with the [facility] please come in thru the Main Entrance and check in at the main information desk.  You need to re-schedule your appointment should you arrive 10 or more minutes late.  We strive to give you quality time with our providers, and arriving late affects you and other patients whose appointments are after yours.  Also, if you no show three or more times for appointments you may be dismissed from the clinic at the providers discretion.     Again, thank you for choosing [facility].  Our hope is that these requests will decrease the amount of time that you wait before being seen by our physicians.       _____________________________________________________________  Should you have questions after your visit to [facility], please contact our office at [phone] and follow the prompts.  Our office hours are 8:00 a.m. and 4:30 p.m. Monday - Friday.  Please note that voicemails left after 4:00 p.m. may not be returned until the following business day.  We are closed weekends and major holidays.  You do have access to a nurse 24-7, just call the main number to the clinic [phone] and do not press any options, hold on the line and a nurse will answer the phone.    For prescription refill requests, have your pharmacy contact our office and allow 72 hours.

## 2023-02-11 NOTE — Patient Instructions (Signed)
[facility]  Discharge Instructions: Thank you for choosing [facility] to provide your oncology and hematology care.  If you have a lab appointment with the [facility], please come in thru the Main Entrance and check in at the main information desk.  Wear comfortable clothing and clothing appropriate for easy access to any Portacath or PICC line.   We strive to give you quality time with your provider. You may need to reschedule your appointment if you arrive late (15 or more minutes).  Arriving late affects you and other patients whose appointments are after yours.  Also, if you miss three or more appointments without notifying the office, you may be dismissed from the clinic at the provider's discretion.      For prescription refill requests, have your pharmacy contact our office and allow 72 hours for refills to be completed.    Today you received the following chemotherapy and/or immunotherapy agents 1UPRBC/NS40K 4 grams magnesium      To help prevent nausea and vomiting after your treatment, we encourage you to take your nausea medication as directed.  BELOW ARE SYMPTOMS THAT SHOULD BE REPORTED IMMEDIATELY: *FEVER GREATER THAN 100.4 F (38 C) OR HIGHER *CHILLS OR SWEATING *NAUSEA AND VOMITING THAT IS NOT CONTROLLED WITH YOUR NAUSEA MEDICATION *UNUSUAL SHORTNESS OF BREATH *UNUSUAL BRUISING OR BLEEDING *URINARY PROBLEMS (pain or burning when urinating, or frequent urination) *BOWEL PROBLEMS (unusual diarrhea, constipation, pain near the anus) TENDERNESS IN MOUTH AND THROAT WITH OR WITHOUT PRESENCE OF ULCERS (sore throat, sores in mouth, or a toothache) UNUSUAL RASH, SWELLING OR PAIN  UNUSUAL VAGINAL DISCHARGE OR ITCHING   Items with * indicate a potential emergency and should be followed up as soon as possible or go to the Emergency Department if any problems should occur.  Please show the CHEMOTHERAPY ALERT CARD or IMMUNOTHERAPY ALERT CARD at  check-in to the Emergency Department and triage nurse.  Should you have questions after your visit or need to cancel or reschedule your appointment, please contact [facility] [phone]  and follow the prompts.  Office hours are 8:00 a.m. to 4:30 p.m. Monday - Friday. Please note that voicemails left after 4:00 p.m. may not be returned until the following business day.  We are closed weekends and major holidays. You have access to a nurse at all times for urgent questions. Please call the main number to the clinic [phone] and follow the prompts.  For any non-urgent questions, you may also contact your provider using MyChart. We now offer e-Visits for anyone 46 and older to request care online for non-urgent symptoms. For details visit [email or web address].   Also download the MyChart app! Go to the app store, search "MyChart", open the app, select [facility], and log in with your MyChart username and password.

## 2023-02-11 NOTE — Progress Notes (Signed)
CRITICAL VALUE ALERT Critical value received:  WBC 1.2.  Date of notification:  02-11-2023 Time of notification: [id number] am.  Critical value read back:  Yes.   Nurse who received alert:  B.Eulan Heyward RN.  MD notified time and response:  Dr. Delton Coombes @ 1031 am.

## 2023-02-12 ENCOUNTER — Encounter: Payer: Self-pay | Admitting: Physician Assistant

## 2023-02-12 ENCOUNTER — Inpatient Hospital Stay: Payer: Medicare HMO

## 2023-02-12 VITALS — BP 128/70 | HR 92 | Temp 98.7°F | Resp 19

## 2023-02-12 VITALS — BP 124/69 | HR 87 | Resp 18

## 2023-02-12 DIAGNOSIS — Z95828 Presence of other vascular implants and grafts: Secondary | ICD-10-CM

## 2023-02-12 DIAGNOSIS — E876 Hypokalemia: Secondary | ICD-10-CM

## 2023-02-12 DIAGNOSIS — C20 Malignant neoplasm of rectum: Secondary | ICD-10-CM

## 2023-02-12 DIAGNOSIS — T451X5A Adverse effect of antineoplastic and immunosuppressive drugs, initial encounter: Secondary | ICD-10-CM

## 2023-02-12 DIAGNOSIS — Z5111 Encounter for antineoplastic chemotherapy: Secondary | ICD-10-CM | POA: Diagnosis not present

## 2023-02-12 LAB — MAGNESIUM: Magnesium: 1.8 mg/dL (ref 1.7–2.4)

## 2023-02-12 LAB — CBC WITH DIFFERENTIAL/PLATELET
Abs Immature Granulocytes: 0 10*3/uL (ref 0.00–0.07)
Band Neutrophils: 6 %
Basophils Absolute: 0 10*3/uL (ref 0.0–0.1)
Basophils Relative: 1 %
Eosinophils Absolute: 0 10*3/uL (ref 0.0–0.5)
Eosinophils Relative: 0 %
HCT: 25.2 % — ABNORMAL LOW (ref 36.0–46.0)
Hemoglobin: 8.3 g/dL — ABNORMAL LOW (ref 12.0–15.0)
Lymphocytes Relative: 61 %
Lymphs Abs: 1 10*3/uL (ref 0.7–4.0)
MCH: 26.8 pg (ref 26.0–34.0)
MCHC: 32.9 g/dL (ref 30.0–36.0)
MCV: 81.3 fL (ref 80.0–100.0)
Monocytes Absolute: 0.1 10*3/uL (ref 0.1–1.0)
Monocytes Relative: 8 %
Neutro Abs: 0.5 10*3/uL — ABNORMAL LOW (ref 1.7–7.7)
Neutrophils Relative %: 24 %
Platelets: 154 10*3/uL (ref 150–400)
RBC: 3.1 MIL/uL — ABNORMAL LOW (ref 3.87–5.11)
RDW: 18.5 % — ABNORMAL HIGH (ref 11.5–15.5)
WBC: 1.7 10*3/uL — ABNORMAL LOW (ref 4.0–10.5)
nRBC: 0 % (ref 0.0–0.2)
nRBC: 2 /100 WBC — ABNORMAL HIGH

## 2023-02-12 LAB — TYPE AND SCREEN
ABO/RH(D): A POS
Antibody Screen: NEGATIVE
Unit division: 0

## 2023-02-12 LAB — COMPREHENSIVE METABOLIC PANEL
ALT: 17 U/L (ref 0–44)
AST: 19 U/L (ref 15–41)
Albumin: 2.9 g/dL — ABNORMAL LOW (ref 3.5–5.0)
Alkaline Phosphatase: 66 U/L (ref 38–126)
Anion gap: 7 (ref 5–15)
BUN: 7 mg/dL — ABNORMAL LOW (ref 8–23)
CO2: 24 mmol/L (ref 22–32)
Calcium: 7.9 mg/dL — ABNORMAL LOW (ref 8.9–10.3)
Chloride: 104 mmol/L (ref 98–111)
Creatinine, Ser: 0.79 mg/dL (ref 0.44–1.00)
GFR, Estimated: >60 mL/min (ref >60)
Glucose, Bld: 87 mg/dL (ref 70–99)
Potassium: 2.5 mmol/L — CL (ref 3.5–5.1)
Sodium: 135 mmol/L (ref 135–145)
Total Bilirubin: 0.8 mg/dL (ref 0.3–1.2)
Total Protein: 6 g/dL — ABNORMAL LOW (ref 6.5–8.1)

## 2023-02-12 LAB — BPAM RBC
Blood Product Expiration Date: 202403282359
ISSUE DATE / TIME: 202403211251
Unit Type and Rh: 600

## 2023-02-12 MED ORDER — ONDANSETRON 4 MG PO TBDP: 8.0000 mg | ORAL_TABLET | Freq: Three times a day (TID) | ORAL | 1 refills | Status: DC | PRN

## 2023-02-12 MED ORDER — POTASSIUM CHLORIDE IN NACL 40-0.9 MEQ/L-% IV SOLN
Freq: Once | INTRAVENOUS | Status: AC
  Filled 2023-02-12: qty 1000

## 2023-02-12 MED ORDER — HEPARIN SOD (PORK) LOCK FLUSH 100 UNIT/ML IV SOLN
500.0000 [IU] | Freq: Once | INTRAVENOUS | Status: AC
  Administered 2023-02-12: 500 [IU] via INTRAVENOUS

## 2023-02-12 MED ORDER — POTASSIUM CHLORIDE 40 MEQ/15ML (20%) PO SOLN: ORAL | 2 refills | Status: DC

## 2023-02-12 MED ORDER — MAGNESIUM OXIDE -MG SUPPLEMENT 400 (240 MG) MG PO TABS: 400.0000 mg | ORAL_TABLET | Freq: Two times a day (BID) | ORAL | 2 refills | Status: DC

## 2023-02-12 MED ORDER — SODIUM CHLORIDE 0.9% FLUSH
10.0000 mL | Freq: Once | INTRAVENOUS | Status: AC
  Administered 2023-02-12: 10 mL via INTRAVENOUS

## 2023-02-12 MED ORDER — POTASSIUM CHLORIDE 20 MEQ PO PACK
40.0000 meq | PACK | Freq: Once | ORAL | Status: AC
  Administered 2023-02-12: 40 meq via ORAL
  Filled 2023-02-12: qty 2

## 2023-02-12 MED ORDER — SUCRALFATE 1 GM/10ML PO SUSP: 1.0000 g | Freq: Four times a day (QID) | ORAL | 2 refills | Status: DC

## 2023-02-12 NOTE — Progress Notes (Signed)
CRITICAL VALUE ALERT Critical value received:  Potassium 2.5 Date of notification:  02-12-2023 Time of notification: 1025 am Critical value read back:  Yes.   Nurse who received alert:  B.Mohamedamin Nifong RN.  MD notified time and response:  R. Pennington PA @ 1035 am. Patient to receive potassium infusion today.

## 2023-02-12 NOTE — Patient Instructions (Addendum)
MEDICATION INSTRUCTIONS: The following prescriptions have been sent to your local pharmacy [facility]). Potassium chloride (liquid solution): Take 15 mL twice daily for the next 2 days (through Sunday 3/24).  Starting on Monday 3/25 you should take 15 mL once daily. Ondansetron (Zofran): Take 2 tablets (8 mg total) every 8 hours as needed for refractory nausea/vomiting.  Take this in between your doses of Compazine to maximize symptom relief. Magnesium oxide: Take 1 tablet (400 mg) twice daily. Prescription mouthwash (Carafate + lidocaine): Take 10 mL by mouth 4 times daily.  Swish and swallow.  If you have severe nausea, vomiting, or diarrhea over the weekend, please go to the emergency department. Ongoing fluid loss from nausea/vomiting/diarrhea places you at risk of worsening dehydration or electrolyte imbalance.  We will recheck labs on Monday 3/25.  [facility]  Discharge Instructions: Thank you for choosing [facility] to provide your oncology and hematology care.  If you have a lab appointment with the [facility], please come in thru the Main Entrance and check in at the main information desk.  Wear comfortable clothing and clothing appropriate for easy access to any Portacath or PICC line.   We strive to give you quality time with your provider. You may need to reschedule your appointment if you arrive late (15 or more minutes).  Arriving late affects you and other patients whose appointments are after yours.  Also, if you miss three or more appointments without notifying the office, you may be dismissed from the clinic at the provider's discretion.      For prescription refill requests, have your pharmacy contact our office and allow 72 hours for refills to be completed.    Today you received the following potassium chloride, return as scheduled.   To help prevent nausea and vomiting after your treatment, we encourage you to take your  nausea medication as directed.  BELOW ARE SYMPTOMS THAT SHOULD BE REPORTED IMMEDIATELY: *FEVER GREATER THAN 100.4 F (38 C) OR HIGHER *CHILLS OR SWEATING *NAUSEA AND VOMITING THAT IS NOT CONTROLLED WITH YOUR NAUSEA MEDICATION *UNUSUAL SHORTNESS OF BREATH *UNUSUAL BRUISING OR BLEEDING *URINARY PROBLEMS (pain or burning when urinating, or frequent urination) *BOWEL PROBLEMS (unusual diarrhea, constipation, pain near the anus) TENDERNESS IN MOUTH AND THROAT WITH OR WITHOUT PRESENCE OF ULCERS (sore throat, sores in mouth, or a toothache) UNUSUAL RASH, SWELLING OR PAIN  UNUSUAL VAGINAL DISCHARGE OR ITCHING   Items with * indicate a potential emergency and should be followed up as soon as possible or go to the Emergency Department if any problems should occur.  Please show the CHEMOTHERAPY ALERT CARD or IMMUNOTHERAPY ALERT CARD at check-in to the Emergency Department and triage nurse.  Should you have questions after your visit or need to cancel or reschedule your appointment, please contact [facility] [phone]  and follow the prompts.  Office hours are 8:00 a.m. to 4:30 p.m. Monday - Friday. Please note that voicemails left after 4:00 p.m. may not be returned until the following business day.  We are closed weekends and major holidays. You have access to a nurse at all times for urgent questions. Please call the main number to the clinic [phone] and follow the prompts.  For any non-urgent questions, you may also contact your provider using MyChart. We now offer e-Visits for anyone 70 and older to request care online for non-urgent symptoms. For details visit [email or web address].   Also download the MyChart app! Go to the  app store, search "MyChart", open the app, select [facility], and log in with your MyChart username and password.

## 2023-02-12 NOTE — Progress Notes (Signed)
Patient presents today for possible blood and IVF. Patient's potassium 2.5. Patient reports not picking up potassium from pharmacy, and she was unable to take PO potassium in clinic d/t not being it able to get it down. Tarri Abernethy, PA made aware, per PA give 64mEq in Liter of NS, and 40 mEq of liquid potassium.  Patient tolerated therapy with no complaints voiced. Side effects with management reviewed with understanding verbalized. Port site clean and dry with no bruising or swelling noted at site. Good blood return noted before and after administration of therapy. Band aid applied. Patient left in satisfactory condition with VSS and no s/s of distress noted.

## 2023-02-15 ENCOUNTER — Other Ambulatory Visit: Payer: Self-pay | Admitting: "Endocrinology

## 2023-02-15 ENCOUNTER — Inpatient Hospital Stay: Payer: Medicare HMO

## 2023-02-15 ENCOUNTER — Other Ambulatory Visit: Payer: Self-pay | Admitting: *Deleted

## 2023-02-15 DIAGNOSIS — D509 Iron deficiency anemia, unspecified: Secondary | ICD-10-CM

## 2023-02-15 DIAGNOSIS — E876 Hypokalemia: Secondary | ICD-10-CM

## 2023-02-15 DIAGNOSIS — Z5111 Encounter for antineoplastic chemotherapy: Secondary | ICD-10-CM | POA: Diagnosis not present

## 2023-02-15 DIAGNOSIS — C20 Malignant neoplasm of rectum: Secondary | ICD-10-CM

## 2023-02-15 DIAGNOSIS — T451X5A Adverse effect of antineoplastic and immunosuppressive drugs, initial encounter: Secondary | ICD-10-CM

## 2023-02-15 LAB — COMPREHENSIVE METABOLIC PANEL
ALT: 14 U/L (ref 0–44)
AST: 19 U/L (ref 15–41)
Albumin: 2.8 g/dL — ABNORMAL LOW (ref 3.5–5.0)
Alkaline Phosphatase: 69 U/L (ref 38–126)
Anion gap: 10 (ref 5–15)
BUN: 7 mg/dL — ABNORMAL LOW (ref 8–23)
CO2: 25 mmol/L (ref 22–32)
Calcium: 8.3 mg/dL — ABNORMAL LOW (ref 8.9–10.3)
Chloride: 105 mmol/L (ref 98–111)
Creatinine, Ser: 0.92 mg/dL (ref 0.44–1.00)
GFR, Estimated: >60 mL/min (ref >60)
Glucose, Bld: 89 mg/dL (ref 70–99)
Potassium: 2.5 mmol/L — CL (ref 3.5–5.1)
Sodium: 140 mmol/L (ref 135–145)
Total Bilirubin: 0.7 mg/dL (ref 0.3–1.2)
Total Protein: 5.8 g/dL — ABNORMAL LOW (ref 6.5–8.1)

## 2023-02-15 LAB — CBC WITH DIFFERENTIAL/PLATELET
Abs Immature Granulocytes: 0.3 10*3/uL — ABNORMAL HIGH (ref 0.00–0.07)
Band Neutrophils: 2 %
Basophils Absolute: 0 10*3/uL (ref 0.0–0.1)
Basophils Relative: 0 %
Eosinophils Absolute: 0.1 10*3/uL (ref 0.0–0.5)
Eosinophils Relative: 1 %
HCT: 27.1 % — ABNORMAL LOW (ref 36.0–46.0)
Hemoglobin: 8.8 g/dL — ABNORMAL LOW (ref 12.0–15.0)
Lymphocytes Relative: 37 %
Lymphs Abs: 2.1 10*3/uL (ref 0.7–4.0)
MCH: 26.7 pg (ref 26.0–34.0)
MCHC: 32.5 g/dL (ref 30.0–36.0)
MCV: 82.1 fL (ref 80.0–100.0)
Metamyelocytes Relative: 5 %
Monocytes Absolute: 0.3 10*3/uL (ref 0.1–1.0)
Monocytes Relative: 6 %
Neutro Abs: 2.9 10*3/uL (ref 1.7–7.7)
Neutrophils Relative %: 49 %
Platelets: 169 10*3/uL (ref 150–400)
RBC: 3.3 MIL/uL — ABNORMAL LOW (ref 3.87–5.11)
RDW: 18.9 % — ABNORMAL HIGH (ref 11.5–15.5)
WBC: 5.6 10*3/uL (ref 4.0–10.5)
nRBC: 1.2 % — ABNORMAL HIGH (ref 0.0–0.2)
nRBC: 3 /100 WBC — ABNORMAL HIGH

## 2023-02-15 LAB — SAMPLE TO BLOOD BANK

## 2023-02-15 LAB — MAGNESIUM: Magnesium: 1.5 mg/dL — ABNORMAL LOW (ref 1.7–2.4)

## 2023-02-15 MED ORDER — POTASSIUM CHLORIDE 20 MEQ PO PACK
40.0000 meq | PACK | ORAL | Status: AC
  Administered 2023-02-15 (×2): 40 meq via ORAL
  Filled 2023-02-15: qty 2

## 2023-02-15 MED ORDER — ALBUTEROL SULFATE HFA 108 (90 BASE) MCG/ACT IN AERS: 2.0000 | INHALATION_SPRAY | Freq: Once | RESPIRATORY_TRACT | Status: DC | PRN

## 2023-02-15 MED ORDER — POTASSIUM CHLORIDE IN NACL 20-0.9 MEQ/L-% IV SOLN
Freq: Once | INTRAVENOUS | Status: AC
  Filled 2023-02-15: qty 1000

## 2023-02-15 MED ORDER — MAGNESIUM SULFATE 2 GM/50ML IV SOLN
2.0000 g | Freq: Once | INTRAVENOUS | Status: AC
  Administered 2023-02-15: 2 g via INTRAVENOUS
  Filled 2023-02-15: qty 50

## 2023-02-15 MED ORDER — FAMOTIDINE IN NACL 20-0.9 MG/50ML-% IV SOLN: 20.0000 mg | Freq: Once | INTRAVENOUS | Status: DC | PRN

## 2023-02-15 MED ORDER — HEPARIN SOD (PORK) LOCK FLUSH 100 UNIT/ML IV SOLN: 250.0000 [IU] | Freq: Once | INTRAVENOUS | Status: DC | PRN

## 2023-02-15 MED ORDER — BLOOD GLUCOSE MONITOR KIT: PACK | 0 refills | Status: DC

## 2023-02-15 MED ORDER — CETIRIZINE HCL 10 MG PO TABS: 10.0000 mg | ORAL_TABLET | Freq: Once | ORAL | Status: DC

## 2023-02-15 MED ORDER — DIPHENHYDRAMINE HCL 50 MG/ML IJ SOLN: 50.0000 mg | Freq: Once | INTRAMUSCULAR | Status: DC | PRN

## 2023-02-15 MED ORDER — SODIUM CHLORIDE 0.9 % IV SOLN: Freq: Once | INTRAVENOUS | Status: AC

## 2023-02-15 MED ORDER — SODIUM CHLORIDE 0.9% FLUSH: 3.0000 mL | Freq: Once | INTRAVENOUS | Status: DC | PRN

## 2023-02-15 MED ORDER — ALTEPLASE 2 MG IJ SOLR: 2.0000 mg | Freq: Once | INTRAMUSCULAR | Status: DC | PRN

## 2023-02-15 MED ORDER — METHYLPREDNISOLONE SODIUM SUCC 125 MG IJ SOLR: 125.0000 mg | Freq: Once | INTRAMUSCULAR | Status: DC | PRN

## 2023-02-15 MED ORDER — SODIUM CHLORIDE 0.9% FLUSH
10.0000 mL | INTRAVENOUS | Status: DC | PRN
  Administered 2023-02-15: 10 mL via INTRAVENOUS

## 2023-02-15 MED ORDER — EPINEPHRINE 0.3 MG/0.3ML IJ SOAJ: 0.3000 mg | Freq: Once | INTRAMUSCULAR | Status: DC | PRN

## 2023-02-15 MED ORDER — SODIUM CHLORIDE 0.9 % IV SOLN
400.0000 mg | Freq: Once | INTRAVENOUS | Status: AC
  Administered 2023-02-15: 400 mg via INTRAVENOUS
  Filled 2023-02-15: qty 20

## 2023-02-15 MED ORDER — SODIUM CHLORIDE 0.9 % IV SOLN: Freq: Once | INTRAVENOUS | Status: DC | PRN

## 2023-02-15 MED ORDER — SODIUM CHLORIDE 0.9% FLUSH
10.0000 mL | Freq: Once | INTRAVENOUS | Status: AC | PRN
  Administered 2023-02-15: 10 mL

## 2023-02-15 MED ORDER — ACETAMINOPHEN 325 MG PO TABS: 650.0000 mg | ORAL_TABLET | Freq: Once | ORAL | Status: DC

## 2023-02-15 MED ORDER — HEPARIN SOD (PORK) LOCK FLUSH 100 UNIT/ML IV SOLN
500.0000 [IU] | Freq: Once | INTRAVENOUS | Status: AC | PRN
  Administered 2023-02-15: 500 [IU]

## 2023-02-15 NOTE — Progress Notes (Signed)
CRITICAL VALUE ALERT Critical value received:  K+ 2.5 Date of notification:  02-15-23 Time of notification: 0935 Critical value read back:  Yes.   Nurse who received alert:  C. pageRN MD notified time and response:  0945, Dr. Delton Coombes, will give additional potassium per orders.

## 2023-02-15 NOTE — Progress Notes (Signed)
Patient presents today for iron infusion and possible IVF per provider's order.  Patient is in satisfactory condition with no new complaints voiced.  Vital signs are stable. We will proceed with infusion per provider orders.    Pt took pre-meds at home prior to arrival for IV iron infusion. Pt's magnesium is 1.5 and potassium is 2.5 today. Pt will receive 2g IV magnesium sulfate and 40 mEq liquid  p.o x 1 dose  pre, 20 mEq IV potassium chloride, and 40 mEq liquid post p.o x 1 dose.  Discharged from clinic via wheelchair in stable condition. Alert and oriented x 3. F/U with [facility] as scheduled.

## 2023-02-15 NOTE — Patient Instructions (Signed)
[facility]  Discharge Instructions: Thank you for choosing [facility] to provide your oncology and hematology care.  If you have a lab appointment with the [facility], please come in thru the Main Entrance and check in at the main information desk.  Wear comfortable clothing and clothing appropriate for easy access to any Portacath or PICC line.   We strive to give you quality time with your provider. You may need to reschedule your appointment if you arrive late (15 or more minutes).  Arriving late affects you and other patients whose appointments are after yours.  Also, if you miss three or more appointments without notifying the office, you may be dismissed from the clinic at the provider's discretion.      For prescription refill requests, have your pharmacy contact our office and allow 72 hours for refills to be completed.    Today you received the following chemotherapy and/or immunotherapy agents Venofer IV iron, 2g IV magnesium sulfate, 40 mEq liquid pre, 20 IV potassium chloride, and 40 mEq post   To help prevent nausea and vomiting after your treatment, we encourage you to take your nausea medication as directed.  BELOW ARE SYMPTOMS THAT SHOULD BE REPORTED IMMEDIATELY: *FEVER GREATER THAN 100.4 F (38 C) OR HIGHER *CHILLS OR SWEATING *NAUSEA AND VOMITING THAT IS NOT CONTROLLED WITH YOUR NAUSEA MEDICATION *UNUSUAL SHORTNESS OF BREATH *UNUSUAL BRUISING OR BLEEDING *URINARY PROBLEMS (pain or burning when urinating, or frequent urination) *BOWEL PROBLEMS (unusual diarrhea, constipation, pain near the anus) TENDERNESS IN MOUTH AND THROAT WITH OR WITHOUT PRESENCE OF ULCERS (sore throat, sores in mouth, or a toothache) UNUSUAL RASH, SWELLING OR PAIN  UNUSUAL VAGINAL DISCHARGE OR ITCHING   Items with * indicate a potential emergency and should be followed up as soon as possible or go to the Emergency Department if any problems should  occur.  Please show the CHEMOTHERAPY ALERT CARD or IMMUNOTHERAPY ALERT CARD at check-in to the Emergency Department and triage nurse.  Should you have questions after your visit or need to cancel or reschedule your appointment, please contact [facility] [phone]  and follow the prompts.  Office hours are 8:00 a.m. to 4:30 p.m. Monday - Friday. Please note that voicemails left after 4:00 p.m. may not be returned until the following business day.  We are closed weekends and major holidays. You have access to a nurse at all times for urgent questions. Please call the main number to the clinic [phone] and follow the prompts.  For any non-urgent questions, you may also contact your provider using MyChart. We now offer e-Visits for anyone 46 and older to request care online for non-urgent symptoms. For details visit [email or web address].   Also download the MyChart app! Go to the app store, search "MyChart", open the app, select [facility], and log in with your MyChart username and password.

## 2023-02-16 ENCOUNTER — Other Ambulatory Visit: Payer: Self-pay

## 2023-02-16 MED ORDER — BLOOD GLUCOSE MONITOR KIT: PACK | 0 refills | Status: AC

## 2023-02-17 ENCOUNTER — Inpatient Hospital Stay (HOSPITAL_BASED_OUTPATIENT_CLINIC_OR_DEPARTMENT_OTHER): Payer: Medicare HMO | Admitting: Hematology

## 2023-02-17 ENCOUNTER — Inpatient Hospital Stay: Payer: Medicare HMO

## 2023-02-17 ENCOUNTER — Inpatient Hospital Stay: Payer: Medicare HMO | Admitting: Hematology

## 2023-02-17 VITALS — BP 131/79 | HR 88 | Temp 98.5°F | Resp 18

## 2023-02-17 DIAGNOSIS — Z5111 Encounter for antineoplastic chemotherapy: Secondary | ICD-10-CM | POA: Diagnosis not present

## 2023-02-17 DIAGNOSIS — C20 Malignant neoplasm of rectum: Secondary | ICD-10-CM

## 2023-02-17 DIAGNOSIS — E876 Hypokalemia: Secondary | ICD-10-CM

## 2023-02-17 LAB — CBC WITH DIFFERENTIAL/PLATELET
Abs Immature Granulocytes: 0.3 10*3/uL — ABNORMAL HIGH (ref 0.00–0.07)
Band Neutrophils: 12 %
Basophils Absolute: 0 10*3/uL (ref 0.0–0.1)
Basophils Relative: 0 %
Eosinophils Absolute: 0.2 10*3/uL (ref 0.0–0.5)
Eosinophils Relative: 3 %
HCT: 28.2 % — ABNORMAL LOW (ref 36.0–46.0)
Hemoglobin: 8.9 g/dL — ABNORMAL LOW (ref 12.0–15.0)
Lymphocytes Relative: 29 %
Lymphs Abs: 1.9 10*3/uL (ref 0.7–4.0)
MCH: 26.5 pg (ref 26.0–34.0)
MCHC: 31.6 g/dL (ref 30.0–36.0)
MCV: 83.9 fL (ref 80.0–100.0)
Metamyelocytes Relative: 2 %
Monocytes Absolute: 0.2 10*3/uL (ref 0.1–1.0)
Monocytes Relative: 3 %
Myelocytes: 3 %
Neutro Abs: 3.9 10*3/uL (ref 1.7–7.7)
Neutrophils Relative %: 48 %
Platelets: 154 10*3/uL (ref 150–400)
RBC: 3.36 MIL/uL — ABNORMAL LOW (ref 3.87–5.11)
RDW: 20.4 % — ABNORMAL HIGH (ref 11.5–15.5)
WBC: 6.5 10*3/uL (ref 4.0–10.5)
nRBC: 1.7 % — ABNORMAL HIGH (ref 0.0–0.2)

## 2023-02-17 LAB — SAMPLE TO BLOOD BANK

## 2023-02-17 LAB — COMPREHENSIVE METABOLIC PANEL
ALT: 13 U/L (ref 0–44)
AST: 20 U/L (ref 15–41)
Albumin: 2.8 g/dL — ABNORMAL LOW (ref 3.5–5.0)
Alkaline Phosphatase: 74 U/L (ref 38–126)
Anion gap: 7 (ref 5–15)
BUN: 7 mg/dL — ABNORMAL LOW (ref 8–23)
CO2: 24 mmol/L (ref 22–32)
Calcium: 8.1 mg/dL — ABNORMAL LOW (ref 8.9–10.3)
Chloride: 108 mmol/L (ref 98–111)
Creatinine, Ser: 0.96 mg/dL (ref 0.44–1.00)
GFR, Estimated: >60 mL/min (ref >60)
Glucose, Bld: 87 mg/dL (ref 70–99)
Potassium: 2.9 mmol/L — ABNORMAL LOW (ref 3.5–5.1)
Sodium: 139 mmol/L (ref 135–145)
Total Bilirubin: 0.6 mg/dL (ref 0.3–1.2)
Total Protein: 5.8 g/dL — ABNORMAL LOW (ref 6.5–8.1)

## 2023-02-17 LAB — MAGNESIUM: Magnesium: 1.8 mg/dL (ref 1.7–2.4)

## 2023-02-17 MED ORDER — POTASSIUM CHLORIDE 20 MEQ PO PACK
40.0000 meq | PACK | Freq: Once | ORAL | Status: AC
  Administered 2023-02-17: 40 meq via ORAL
  Filled 2023-02-17: qty 2

## 2023-02-17 MED ORDER — SODIUM CHLORIDE 0.9 % IV SOLN
10.0000 mg | Freq: Once | INTRAVENOUS | Status: AC
  Administered 2023-02-17: 10 mg via INTRAVENOUS
  Filled 2023-02-17: qty 10

## 2023-02-17 MED ORDER — SODIUM CHLORIDE 0.9 % IV SOLN
380.0000 mg | Freq: Once | INTRAVENOUS | Status: AC
  Administered 2023-02-17: 380 mg via INTRAVENOUS
  Filled 2023-02-17: qty 19

## 2023-02-17 MED ORDER — SODIUM CHLORIDE 0.9 % IV SOLN
400.0000 mg/m2 | Freq: Once | INTRAVENOUS | Status: AC
  Administered 2023-02-17: 1004 mg via INTRAVENOUS
  Filled 2023-02-17: qty 50.2

## 2023-02-17 MED ORDER — DEXTROSE 5 % IV SOLN: Freq: Once | INTRAVENOUS | Status: AC

## 2023-02-17 MED ORDER — ATROPINE SULFATE 1 MG/ML IV SOLN
0.5000 mg | Freq: Once | INTRAVENOUS | Status: AC | PRN
  Administered 2023-02-17: 0.5 mg via INTRAVENOUS
  Filled 2023-02-17: qty 1

## 2023-02-17 MED ORDER — PALONOSETRON HCL INJECTION 0.25 MG/5ML
0.2500 mg | Freq: Once | INTRAVENOUS | Status: AC
  Administered 2023-02-17: 0.25 mg via INTRAVENOUS
  Filled 2023-02-17: qty 5

## 2023-02-17 MED ORDER — SODIUM CHLORIDE 0.9 % IV SOLN
150.0000 mg | Freq: Once | INTRAVENOUS | Status: AC
  Administered 2023-02-17: 150 mg via INTRAVENOUS
  Filled 2023-02-17: qty 150

## 2023-02-17 MED ORDER — OXALIPLATIN CHEMO INJECTION 100 MG/20ML
130.0000 mg | Freq: Once | INTRAVENOUS | Status: AC
  Administered 2023-02-17: 130 mg via INTRAVENOUS
  Filled 2023-02-17: qty 20

## 2023-02-17 MED ORDER — SODIUM CHLORIDE 0.9 % IV SOLN
1920.0000 mg/m2 | INTRAVENOUS | Status: AC
  Administered 2023-02-17: 5000 mg via INTRAVENOUS
  Filled 2023-02-17: qty 100

## 2023-02-17 MED ORDER — SODIUM CHLORIDE 0.9 % IV SOLN: INTRAVENOUS | Status: AC

## 2023-02-17 NOTE — Addendum Note (Signed)
Addended by: Wynona Neat on: 02/17/2023 09:36 AM   Modules accepted: Orders

## 2023-02-17 NOTE — Patient Instructions (Signed)
[facility] at [facility] Discharge Instructions   You were seen and examined today by Dr. Delton Coombes.  He reviewed the results of your lab work which are normal/stable. Your potassium is low at 2.9. We will give you potassium in the clinic today.   We will proceed with your treatment today.   Return as scheduled.    Thank you for choosing [place] at [facility] to provide your oncology and hematology care.  To afford each patient quality time with our provider, please arrive at least 15 minutes before your scheduled appointment time.   If you have a lab appointment with the [facility] please come in thru the Main Entrance and check in at the main information desk.  You need to re-schedule your appointment should you arrive 10 or more minutes late.  We strive to give you quality time with our providers, and arriving late affects you and other patients whose appointments are after yours.  Also, if you no show three or more times for appointments you may be dismissed from the clinic at the providers discretion.     Again, thank you for choosing [facility].  Our hope is that these requests will decrease the amount of time that you wait before being seen by our physicians.       _____________________________________________________________  Should you have questions after your visit to [facility], please contact our office at [phone] and follow the prompts.  Our office hours are 8:00 a.m. and 4:30 p.m. Monday - Friday.  Please note that voicemails left after 4:00 p.m. may not be returned until the following business day.  We are closed weekends and major holidays.  You do have access to a nurse 24-7, just call the main number to the clinic [phone] and do not press any options, hold on the line and a nurse will answer the phone.    For prescription refill requests, have your pharmacy contact our office and  allow 72 hours.    Due to Covid, you will need to wear a mask upon entering the hospital. If you do not have a mask, a mask will be given to you at the Main Entrance upon arrival. For doctor visits, patients may have 1 support person age 24 or older with them. For treatment visits, patients can not have anyone with them due to social distancing guidelines and our immunocompromised population.

## 2023-02-17 NOTE — Addendum Note (Signed)
Addended by: Terrace Arabia on: 02/17/2023 09:38 AM   Modules accepted: Orders

## 2023-02-17 NOTE — Addendum Note (Signed)
Addended by: Terrace Arabia on: 02/17/2023 01:36 PM   Modules accepted: Orders

## 2023-02-17 NOTE — Addendum Note (Signed)
Addended by: Terrace Arabia on: 02/17/2023 11:33 AM   Modules accepted: Orders

## 2023-02-17 NOTE — Progress Notes (Signed)
Patient has been examined by Dr. Katragadda. Vital signs and labs have been reviewed by MD - ANC, Creatinine, LFTs, hemoglobin, and platelets are within treatment parameters per M.D. - pt may proceed with treatment.  Primary RN and pharmacy notified.  

## 2023-02-17 NOTE — Progress Notes (Signed)
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  Ok per Dr Delton Coombes Henreitta Leber, PharmD

## 2023-02-17 NOTE — Patient Instructions (Signed)
[facility]  Discharge Instructions: Thank you for choosing [facility] to provide your oncology and hematology care.  If you have a lab appointment with the [facility], please come in thru the Main Entrance and check in at the main information desk.  Wear comfortable clothing and clothing appropriate for easy access to any Portacath or PICC line.   We strive to give you quality time with your provider. You may need to reschedule your appointment if you arrive late (15 or more minutes).  Arriving late affects you and other patients whose appointments are after yours.  Also, if you miss three or more appointments without notifying the office, you may be dismissed from the clinic at the provider's discretion.      For prescription refill requests, have your pharmacy contact our office and allow 72 hours for refills to be completed.    Today you received the following chemotherapy and/or immunotherapy agents Folfirinox   To help prevent nausea and vomiting after your treatment, we encourage you to take your nausea medication as directed.  Oxaliplatin Injection What is this medication? OXALIPLATIN (ox AL i PLA tin) treats colorectal cancer. It works by slowing down the growth of cancer cells. This medicine may be used for other purposes; ask your health care provider or pharmacist if you have questions. COMMON BRAND NAME(S): Eloxatin What should I tell my care team before I take this medication? They need to know if you have any of these conditions: Heart disease History of irregular heartbeat or rhythm Liver disease Low blood cell levels (white cells, red cells, and platelets) Lung or breathing disease, such as asthma Take medications that treat or prevent blood clots Tingling of the fingers, toes, or other nerve disorder An unusual or allergic reaction to oxaliplatin, other medications, foods, dyes, or preservatives If you or your partner are pregnant  or trying to get pregnant Breast-feeding How should I use this medication? This medication is injected into a vein. It is given by your care team in a hospital or clinic setting. Talk to your care team about the use of this medication in children. Special care may be needed. Overdosage: If you think you have taken too much of this medicine contact a poison control center or emergency room at once. NOTE: This medicine is only for you. Do not share this medicine with others. What if I miss a dose? Keep appointments for follow-up doses. It is important not to miss a dose. Call your care team if you are unable to keep an appointment. What may interact with this medication? Do not take this medication with any of the following: Cisapride Dronedarone Pimozide Thioridazine This medication may also interact with the following: Aspirin and aspirin-like medications Certain medications that treat or prevent blood clots, such as warfarin, apixaban, dabigatran, and rivaroxaban Cisplatin Cyclosporine Diuretics Medications for infection, such as acyclovir, adefovir, amphotericin B, bacitracin, cidofovir, foscarnet, ganciclovir, gentamicin, pentamidine, vancomycin NSAIDs, medications for pain and inflammation, such as ibuprofen or naproxen Other medications that cause heart rhythm changes Pamidronate Zoledronic acid This list may not describe all possible interactions. Give your health care provider a list of all the medicines, herbs, non-prescription drugs, or dietary supplements you use. Also tell them if you smoke, drink alcohol, or use illegal drugs. Some items may interact with your medicine. What should I watch for while using this medication? Your condition will be monitored carefully while you are receiving this medication. You may need blood work  while taking this medication. This medication may make you feel generally unwell. This is not uncommon as chemotherapy can affect healthy cells as  well as cancer cells. Report any side effects. Continue your course of treatment even though you feel ill unless your care team tells you to stop. This medication may increase your risk of getting an infection. Call your care team for advice if you get a fever, chills, sore throat, or other symptoms of a cold or flu. Do not treat yourself. Try to avoid being around people who are sick. Avoid taking medications that contain aspirin, acetaminophen, ibuprofen, naproxen, or ketoprofen unless instructed by your care team. These medications may hide a fever. Be careful brushing or flossing your teeth or using a toothpick because you may get an infection or bleed more easily. If you have any dental work done, tell your dentist you are receiving this medication. This medication can make you more sensitive to cold. Do not drink cold drinks or use ice. Cover exposed skin before coming in contact with cold temperatures or cold objects. When out in cold weather wear warm clothing and cover your mouth and nose to warm the air that goes into your lungs. Tell your care team if you get sensitive to the cold. Talk to your care team if you or your partner are pregnant or think either of you might be pregnant. This medication can cause serious birth defects if taken during pregnancy and for 9 months after the last dose. A negative pregnancy test is required before starting this medication. A reliable form of contraception is recommended while taking this medication and for 9 months after the last dose. Talk to your care team about effective forms of contraception. Do not father a child while taking this medication and for 6 months after the last dose. Use a condom while having sex during this time period. Do not breastfeed while taking this medication and for 3 months after the last dose. This medication may cause infertility. Talk to your care team if you are concerned about your fertility. What side effects may I notice from  receiving this medication? Side effects that you should report to your care team as soon as possible: Allergic reactions--skin rash, itching, hives, swelling of the face, lips, tongue, or throat Bleeding--bloody or black, tar-like stools, vomiting blood or brown material that looks like coffee grounds, red or dark brown urine, small red or purple spots on skin, unusual bruising or bleeding Dry cough, shortness of breath or trouble breathing Heart rhythm changes--fast or irregular heartbeat, dizziness, feeling faint or lightheaded, chest pain, trouble breathing Infection--fever, chills, cough, sore throat, wounds that don't heal, pain or trouble when passing urine, general feeling of discomfort or being unwell Liver injury--right upper belly pain, loss of appetite, nausea, light-colored stool, dark yellow or brown urine, yellowing skin or eyes, unusual weakness or fatigue Low red blood cell level--unusual weakness or fatigue, dizziness, headache, trouble breathing Muscle injury--unusual weakness or fatigue, muscle pain, dark yellow or brown urine, decrease in amount of urine Pain, tingling, or numbness in the hands or feet Sudden and severe headache, confusion, change in vision, seizures, which may be signs of posterior reversible encephalopathy syndrome (PRES) Unusual bruising or bleeding Side effects that usually do not require medical attention (report to your care team if they continue or are bothersome): Diarrhea Nausea Pain, redness, or swelling with sores inside the mouth or throat Unusual weakness or fatigue Vomiting This list may not describe all possible side  effects. Call your doctor for medical advice about side effects. You may report side effects to FDA at 1-800-FDA-1088. Where should I keep my medication? This medication is given in a hospital or clinic. It will not be stored at home. NOTE: This sheet is a summary. It may not cover all possible information. If you have questions  about this medicine, talk to your doctor, pharmacist, or health care provider.  2023 Elsevier/Gold Standard (2007-12-31 00:00:00)  Irinotecan Injection What is this medication? IRINOTECAN (ir in oh TEE kan) treats some types of cancer. It works by slowing down the growth of cancer cells. This medicine may be used for other purposes; ask your health care provider or pharmacist if you have questions. COMMON BRAND NAME(S): Camptosar What should I tell my care team before I take this medication? They need to know if you have any of these conditions: Dehydration Diarrhea Infection, especially a viral infection, such as chickenpox, cold sores, herpes Liver disease Low blood cell levels (white cells, red cells, and platelets) Low levels of electrolytes, such as calcium, magnesium, or potassium in your blood Recent or ongoing radiation An unusual or allergic reaction to irinotecan, other medications, foods, dyes, or preservatives If you or your partner are pregnant or trying to get pregnant Breast-feeding How should I use this medication? This medication is injected into a vein. It is given by your care team in a hospital or clinic setting. Talk to your care team about the use of this medication in children. Special care may be needed. Overdosage: If you think you have taken too much of this medicine contact a poison control center or emergency room at once. NOTE: This medicine is only for you. Do not share this medicine with others. What if I miss a dose? Keep appointments for follow-up doses. It is important not to miss your dose. Call your care team if you are unable to keep an appointment. What may interact with this medication? Do not take this medication with any of the following: Cobicistat Itraconazole This medication may also interact with the following: Certain antibiotics, such as clarithromycin, rifampin, rifabutin Certain antivirals for HIV or AIDS Certain medications for  fungal infections, such as ketoconazole, posaconazole, voriconazole Certain medications for seizures, such as carbamazepine, phenobarbital, phenytoin Gemfibrozil Nefazodone St. John's wort This list may not describe all possible interactions. Give your health care provider a list of all the medicines, herbs, non-prescription drugs, or dietary supplements you use. Also tell them if you smoke, drink alcohol, or use illegal drugs. Some items may interact with your medicine. What should I watch for while using this medication? Your condition will be monitored carefully while you are receiving this medication. You may need blood work while taking this medication. This medication may make you feel generally unwell. This is not uncommon as chemotherapy can affect healthy cells as well as cancer cells. Report any side effects. Continue your course of treatment even though you feel ill unless your care team tells you to stop. This medication can cause serious side effects. To reduce the risk, your care team may give you other medications to take before receiving this one. Be sure to follow the directions from your care team. This medication may affect your coordination, reaction time, or judgement. Do not drive or operate machinery until you know how this medication affects you. Sit up or stand slowly to reduce the risk of dizzy or fainting spells. Drinking alcohol with this medication can increase the risk of these side  effects. This medication may increase your risk of getting an infection. Call your care team for advice if you get a fever, chills, sore throat, or other symptoms of a cold or flu. Do not treat yourself. Try to avoid being around people who are sick. Avoid taking medications that contain aspirin, acetaminophen, ibuprofen, naproxen, or ketoprofen unless instructed by your care team. These medications may hide a fever. This medication may increase your risk to bruise or bleed. Call your care team  if you notice any unusual bleeding. Be careful brushing or flossing your teeth or using a toothpick because you may get an infection or bleed more easily. If you have any dental work done, tell your dentist you are receiving this medication. Talk to your care team if you or your partner are pregnant or think either of you might be pregnant. This medication can cause serious birth defects if taken during pregnancy and for 6 months after the last dose. You will need a negative pregnancy test before starting this medication. Contraception is recommended while taking this medication and for 6 months after the last dose. Your care team can help you find the option that works for you. Do not father a child while taking this medication and for 3 months after the last dose. Use a condom for contraception during this time period. Do not breastfeed while taking this medication and for 7 days after the last dose. This medication may cause infertility. Talk to your care team if you are concerned about your fertility. What side effects may I notice from receiving this medication? Side effects that you should report to your care team as soon as possible: Allergic reactions--skin rash, itching, hives, swelling of the face, lips, tongue, or throat Dry cough, shortness of breath or trouble breathing Increased saliva or tears, increased sweating, stomach cramping, diarrhea, small pupils, unusual weakness or fatigue, slow heartbeat Infection--fever, chills, cough, sore throat, wounds that don't heal, pain or trouble when passing urine, general feeling of discomfort or being unwell Kidney injury--decrease in the amount of urine, swelling of the ankles, hands, or feet Low red blood cell level--unusual weakness or fatigue, dizziness, headache, trouble breathing Severe or prolonged diarrhea Unusual bruising or bleeding Side effects that usually do not require medical attention (report to your care team if they continue or  are bothersome): Constipation Diarrhea Hair loss Loss of appetite Nausea Stomach pain This list may not describe all possible side effects. Call your doctor for medical advice about side effects. You may report side effects to FDA at 1-800-FDA-1088. Where should I keep my medication? This medication is given in a hospital or clinic. It will not be stored at home. NOTE: This sheet is a summary. It may not cover all possible information. If you have questions about this medicine, talk to your doctor, pharmacist, or health care provider.  2023 Elsevier/Gold Standard (2022-03-19 00:00:00)  Leucovorin Injection What is this medication? LEUCOVORIN (loo koe VOR in) prevents side effects from certain medications, such as methotrexate. It works by increasing folate levels. This helps protect healthy cells in your body. It may also be used to treat anemia caused by low levels of folate. It can also be used with fluorouracil, a type of chemotherapy, to treat colorectal cancer. It works by increasing the effects of fluorouracil in the body. This medicine may be used for other purposes; ask your health care provider or pharmacist if you have questions. What should I tell my care team before I take this  medication? They need to know if you have any of these conditions: Anemia from low levels of vitamin B12 in the blood An unusual or allergic reaction to leucovorin, folic acid, other medications, foods, dyes, or preservatives Pregnant or trying to get pregnant Breastfeeding How should I use this medication? This medication is injected into a vein or a muscle. It is given by your care team in a hospital or clinic setting. Talk to your care team about the use of this medication in children. Special care may be needed. Overdosage: If you think you have taken too much of this medicine contact a poison control center or emergency room at once. NOTE: This medicine is only for you. Do not share this medicine  with others. What if I miss a dose? Keep appointments for follow-up doses. It is important not to miss your dose. Call your care team if you are unable to keep an appointment. What may interact with this medication? Capecitabine Fluorouracil Phenobarbital Phenytoin Primidone Trimethoprim;sulfamethoxazole This list may not describe all possible interactions. Give your health care provider a list of all the medicines, herbs, non-prescription drugs, or dietary supplements you use. Also tell them if you smoke, drink alcohol, or use illegal drugs. Some items may interact with your medicine. What should I watch for while using this medication? Your condition will be monitored carefully while you are receiving this medication. This medication may increase the side effects of 5-fluorouracil. Tell your care team if you have diarrhea or mouth sores that do not get better or that get worse. What side effects may I notice from receiving this medication? Side effects that you should report to your care team as soon as possible: Allergic reactions--skin rash, itching, hives, swelling of the face, lips, tongue, or throat This list may not describe all possible side effects. Call your doctor for medical advice about side effects. You may report side effectsFluorouracil Injection What is this medication? FLUOROURACIL (flure oh YOOR a sil) treats some types of cancer. It works by slowing down the growth of cancer cells. This medicine may be used for other purposes; ask your health care provider or pharmacist if you have questions. COMMON BRAND NAME(S): Adrucil What should I tell my care team before I take this medication? They need to know if you have any of these conditions: Blood disorders Dihydropyrimidine dehydrogenase (DPD) deficiency Infection, such as chickenpox, cold sores, herpes Kidney disease Liver disease Poor nutrition Recent or ongoing radiation therapy An unusual or allergic reaction to  fluorouracil, other medications, foods, dyes, or preservatives If you or your partner are pregnant or trying to get pregnant Breast-feeding How should I use this medication? This medication is injected into a vein. It is administered by your care team in a hospital or clinic setting. Talk to your care team about the use of this medication in children. Special care may be needed. Overdosage: If you think you have taken too much of this medicine contact a poison control center or emergency room at once. NOTE: This medicine is only for you. Do not share this medicine with others. What if I miss a dose? Keep appointments for follow-up doses. It is important not to miss your dose. Call your care team if you are unable to keep an appointment. What may interact with this medication? Do not take this medication with any of the following: Live virus vaccines This medication may also interact with the following: Medications that treat or prevent blood clots, such as warfarin, enoxaparin, dalteparin  This list may not describe all possible interactions. Give your health care provider a list of all the medicines, herbs, non-prescription drugs, or dietary supplements you use. Also tell them if you smoke, drink alcohol, or use illegal drugs. Some items may interact with your medicine. What should I watch for while using this medication? Your condition will be monitored carefully while you are receiving this medication. This medication may make you feel generally unwell. This is not uncommon as chemotherapy can affect healthy cells as well as cancer cells. Report any side effects. Continue your course of treatment even though you feel ill unless your care team tells you to stop. In some cases, you may be given additional medications to help with side effects. Follow all directions for their use. This medication may increase your risk of getting an infection. Call your care team for advice if you get a fever,  chills, sore throat, or other symptoms of a cold or flu. Do not treat yourself. Try to avoid being around people who are sick. This medication may increase your risk to bruise or bleed. Call your care team if you notice any unusual bleeding. Be careful brushing or flossing your teeth or using a toothpick because you may get an infection or bleed more easily. If you have any dental work done, tell your dentist you are receiving this medication. Avoid taking medications that contain aspirin, acetaminophen, ibuprofen, naproxen, or ketoprofen unless instructed by your care team. These medications may hide a fever. Do not treat diarrhea with over the counter products. Contact your care team if you have diarrhea that lasts more than 2 days or if it is severe and watery. This medication can make you more sensitive to the sun. Keep out of the sun. If you cannot avoid being in the sun, wear protective clothing and sunscreen. Do not use sun lamps, tanning beds, or tanning booths. Talk to your care team if you or your partner wish to become pregnant or think you might be pregnant. This medication can cause serious birth defects if taken during pregnancy and for 3 months after the last dose. A reliable form of contraception is recommended while taking this medication and for 3 months after the last dose. Talk to your care team about effective forms of contraception. Do not father a child while taking this medication and for 3 months after the last dose. Use a condom while having sex during this time period. Do not breastfeed while taking this medication. This medication may cause infertility. Talk to your care team if you are concerned about your fertility. What side effects may I notice from receiving this medication? Side effects that you should report to your care team as soon as possible: Allergic reactions--skin rash, itching, hives, swelling of the face, lips, tongue, or throat Heart attack--pain or tightness  in the chest, shoulders, arms, or jaw, nausea, shortness of breath, cold or clammy skin, feeling faint or lightheaded Heart failure--shortness of breath, swelling of the ankles, feet, or hands, sudden weight gain, unusual weakness or fatigue Heart rhythm changes--fast or irregular heartbeat, dizziness, feeling faint or lightheaded, chest pain, trouble breathing High ammonia level--unusual weakness or fatigue, confusion, loss of appetite, nausea, vomiting, seizures Infection--fever, chills, cough, sore throat, wounds that don't heal, pain or trouble when passing urine, general feeling of discomfort or being unwell Low red blood cell level--unusual weakness or fatigue, dizziness, headache, trouble breathing Pain, tingling, or numbness in the hands or feet, muscle weakness, change in vision, confusion  or trouble speaking, loss of balance or coordination, trouble walking, seizures Redness, swelling, and blistering of the skin over hands and feet Severe or prolonged diarrhea Unusual bruising or bleeding Side effects that usually do not require medical attention (report to your care team if they continue or are bothersome): Dry skin Headache Increased tears Nausea Pain, redness, or swelling with sores inside the mouth or throat Sensitivity to light Vomiting This list may not describe all possible side effects. Call your doctor for medical advice about side effects. You may report side effects to FDA at 1-800-FDA-1088. Where should I keep my medication? This medication is given in a hospital or clinic. It will not be stored at home. NOTE: This sheet is a summary. It may not cover all possible information. If you have questions about this medicine, talk to your doctor, pharmacist, or health care provider.  2023 Elsevier/Gold Standard (2022-03-10 00:00:00)  to FDA at 1-800-FDA-1088. Where should I keep my medication? This medication is given in a hospital or clinic. It will not be stored at  home. NOTE: This sheet is a summary. It may not cover all possible information. If you have questions about this medicine, talk to your doctor, pharmacist, or health care provider.  2023 Elsevier/Gold Standard (2022-03-20 00:00:00)       BELOW ARE SYMPTOMS THAT SHOULD BE REPORTED IMMEDIATELY: *FEVER GREATER THAN 100.4 F (38 C) OR HIGHER *CHILLS OR SWEATING *NAUSEA AND VOMITING THAT IS NOT CONTROLLED WITH YOUR NAUSEA MEDICATION *UNUSUAL SHORTNESS OF BREATH *UNUSUAL BRUISING OR BLEEDING *URINARY PROBLEMS (pain or burning when urinating, or frequent urination) *BOWEL PROBLEMS (unusual diarrhea, constipation, pain near the anus) TENDERNESS IN MOUTH AND THROAT WITH OR WITHOUT PRESENCE OF ULCERS (sore throat, sores in mouth, or a toothache) UNUSUAL RASH, SWELLING OR PAIN  UNUSUAL VAGINAL DISCHARGE OR ITCHING   Items with * indicate a potential emergency and should be followed up as soon as possible or go to the Emergency Department if any problems should occur.  Please show the CHEMOTHERAPY ALERT CARD or IMMUNOTHERAPY ALERT CARD at check-in to the Emergency Department and triage nurse.  Should you have questions after your visit or need to cancel or reschedule your appointment, please contact [facility] [phone]  and follow the prompts.  Office hours are 8:00 a.m. to 4:30 p.m. Monday - Friday. Please note that voicemails left after 4:00 p.m. may not be returned until the following business day.  We are closed weekends and major holidays. You have access to a nurse at all times for urgent questions. Please call the main number to the clinic [phone] and follow the prompts.  For any non-urgent questions, you may also contact your provider using MyChart. We now offer e-Visits for anyone 4 and older to request care online for non-urgent symptoms. For details visit [email or web address].   Also download the MyChart app! Go to the app store, search "MyChart", open  the app, select [facility], and log in with your MyChart username and password.

## 2023-02-17 NOTE — Progress Notes (Addendum)
Patient presents today for Folfirinox infusion.  Patient is in satisfactory condition with no new complaints voiced.  Vital signs are stable.  Labs reviewed and all labs are within treatment parameters. Pt's potassium is 2.9 today, pt will receive 40 mEq potassium chloride liquid p.o per Dr.K. We will proceed with treatment per MD orders.    Folfirinox given today per MD orders. Tolerated infusion without adverse affects. Vital signs stable. No complaints at this time. Discharged from clinic via wheelchair in stable condition. Alert and oriented x 3. F/U with [facility] as scheduled. 5FU ambulatory pump infusing.

## 2023-02-17 NOTE — Progress Notes (Signed)
[facility] [address] [place] [address]    Clinic Day:  02/17/2023  Referring physician: Nickola Major, MD  Patient Care Team: Nickola Major, MD as PCP - General (Family Medicine) Satira Sark, MD as PCP - Cardiology (Cardiology) Gala Romney Cristopher Estimable, MD as Consulting Physician (Gastroenterology) Derek Jack, MD as Medical Oncologist (Medical Oncology) Brien Mates, RN as Oncology Nurse Navigator (Medical Oncology)   ASSESSMENT & PLAN:   Assessment: 1.  Stage IIc (T3d/4b N0 M0) rectal cancer: - Rectal bleeding for the past 4 to 5 years. - Colonoscopy (12/16/2022): Tumor protruding through the anal orifice.  Bulky semilunar lateral rectal tumor extending from the anorectal junction proximally about 13 cm. 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  MSI-high not detected by [id number] - CT CAP (12/17/2022): Locally advanced low rectal primary without bowel obstruction or nodal metastasis.  Isolated right middle lobe subpleural lung nodule most likely benign.  Cirrhosis and portal venous hypertension.  2.6 cm right renal lesion with differential hemorrhagic/proteinaceous cyst or solid neoplasm. - PET scan (12/31/2022): Markedly hypermetabolic rectal lesion with no evidence for perirectal or pelvic lymphadenopathy.  No metastatic disease in the neck, chest, abdomen or pelvis.  Focal hypermetabolism in the region of the gallbladder neck corresponds to subtle 13 mm focus of increased attenuation in the lumen of the gallbladder.  Differential includes gallbladder polyp/neoplasm.  2.7 cm exophytic posterior right interpolar renal lesion shows no substantial hypermetabolism, likely cyst.  2.3 cm inferior right thyroid nodule without hypermetabolism. - MRI pelvis (12/24/2022): Extension through muscularis propria.  Tumor size 3.8 x 3 x 5.8 cm with a volume 35 cm.  T stage is T3d, probable T4.   Suspicion of involvement of pelvic floor musculature and the far posterior and left side of the vagina.   2.  Social/family history: - She lives at home with her family.  She is accompanied by her daughter today.  She is independent of ADLs and IADLs.  She is a retired Freight forwarder at [employer].  Non-smoker.  She reports having hysterectomy in 1986 for cancerous polyps in her endometrium. - Maternal grandmother's sister had colon cancer.    Plan: 1.  Stage IIc (T3d/4BN0M0) rectal cancer, MSI stable: - She has tolerated cycle 2 reasonably well. - She had some nausea and vomiting and forgot to take Compazine. - I have told her to take Compazine in the mornings daily. - Reviewed labs today which showed normal LFTs.  CBC was grossly normal.  She will proceed with cycle 3 with dose reduction.  She will be managed in our symptom management clinic in 1 week.  RTC 2 weeks for follow-up with me.   2.  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  Continue Lyrica twice daily.  No worsening since oxaliplatin started.   3.  Severe microcytic anemia: - She received Venofer 400 mg on 02/08/2023 and 02/15/2023.  Hemoglobin is 8.9.  Closely monitor CBC.  She denies any bleeding per rectum in the last 2 weeks.   4.  Hypokalemia: - Continue potassium 40 mEq liquid from twice daily.  5.  Folic acid deficiency: - Continue folic acid 1 mg tablet daily.  No orders of the defined types were placed in this encounter.  I,[name],acting as a Education administrator for Derek Jack, MD.,have documented all relevant documentation on the behalf of Derek Jack, MD,as directed by  Derek Jack,  MD while in the presence of Derek Jack, MD.  I, Derek Jack MD, have reviewed the above documentation for accuracy and completeness, and I agree with the above.    Derek Jack, MD   3/27/20245:16 PM  CHIEF COMPLAINT:   Diagnosis: stage IIc (T4b N0)  low rectal cancer    Cancer Staging  Rectal cancer [facility]) Staging form: Colon and Rectum, AJCC 8th Edition - Clinical stage from 01/03/2023: Stage IIC (cT4b, cN0, cM0) - Unsigned    Prior Therapy: none  Current Therapy:  FOLFIRINOX   HISTORY OF PRESENT ILLNESS:   Oncology History  Rectal cancer ([place])  01/03/2023 Initial Diagnosis   Rectal cancer ([place])   01/20/2023 -  Chemotherapy   Patient is on Treatment Plan : RECTAL Modified FOLFIRINOX q14d x 8 cycles        INTERVAL HISTORY:   Gianina is a 67 y.o. female presenting to clinic today for follow up of stage IIc (T4b N0) low rectal cancer. She was last seen by me on 02/03/23. She was last seen by PA Pennington on 02/11/23.  Today, she states that she is doing okay overall. Her appetite level is at 60%. Her energy level is at 65%. She reports unchanged neuropathy. Her rectal bleeding has resolved- last episode was x2 weeks ago. She had a normal BM this morning. She states that she has taken 3 Imodiums in the last x2 weeks. Patient reports that she tolerated her last treatment better than previous treatment. She states that her most bothersome symptoms after treatment were nausea and vomiting intermittently. She notes that she forgets to take her Compazine most days, but it relieves her nausea when she does take it. She states that she had some trouble swallowing the potassium pills so she has transitioned to liquid potassium which she is taking BID. She has not yet taken it this morning. She is still not taking Losartan.  PAST MEDICAL HISTORY:   Past Medical History: Past Medical History:  Diagnosis Date   Anxiety    Coronary atherosclerosis    Cardiac CT 09/2018 with calcium score 8.7 and mild proximal LAD disease   Essential hypertension 2009   GERD (gastroesophageal reflux disease)    Hypothyroidism    Iron deficiency anemia    Osteoarthritis    Palpitations    Tachypalpitations on beta blockers since 2010   Type 2 diabetes  mellitus ([place]) 2003   Vitamin B 12 deficiency    Vitamin D deficiency     Surgical History: Past Surgical History:  Procedure Laterality Date   ABDOMINAL HYSTERECTOMY     History of cervical cancer   BIOPSY  12/16/2022   Procedure: BIOPSY;  Surgeon: Daneil Dolin, MD;  [facility];  Service: Endoscopy;;  gastric, rectal   BREAST BIOPSY Right    COLONOSCOPY WITH PROPOFOL N/A 12/16/2022   Procedure: COLONOSCOPY WITH PROPOFOL;  Surgeon: Daneil Dolin, MD;  [facility];  Service: Endoscopy;  Laterality: N/A;  10:45 am   ESOPHAGOGASTRODUODENOSCOPY (EGD) WITH PROPOFOL N/A 12/16/2022   Procedure: ESOPHAGOGASTRODUODENOSCOPY (EGD) WITH PROPOFOL;  Surgeon: Daneil Dolin, MD;  [facility];  Service: Endoscopy;  Laterality: N/A;   IR IMAGING GUIDED PORT INSERTION  01/15/2023   MALONEY DILATION N/A 12/16/2022   Procedure: Venia Minks DILATION;  Surgeon: Daneil Dolin, MD;  [facility];  Service: Endoscopy;  Laterality: N/A;   POLYPECTOMY  12/16/2022   Procedure: POLYPECTOMY;  Surgeon: Daneil Dolin, MD;  [facility]  [facility];  Service: Endoscopy;;   TONSILLECTOMY      Social History: Social History   Socioeconomic History   Marital status: Single    Spouse name: Not on file   Number of children: 2   Years of education: Not on file   Highest education level: Not on file  Occupational History   Occupation: DISABLED    Employer: UNEMPLOYED    Comment: [employer]  Tobacco Use   Smoking status: Never   Smokeless tobacco: Never  Vaping Use   Vaping Use: Never used  Substance and Sexual Activity   Alcohol use: No   Drug use: No   Sexual activity: Never  Other Topics Concern   Not on file  Social History Narrative   Has 2 grandchildren. 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   Social Determinants of Health   Financial Resource Strain: Not on file  Food Insecurity: No Food Insecurity (01/06/2023)   Hunger  Vital Sign    Worried About Running Out of Food in the Last Year: Never true    Ran Out of Food in the Last Year: Never true  Transportation Needs: No Transportation Needs (01/06/2023)   PRAPARE - Hydrologist (Medical): No    Lack of Transportation (Non-Medical): No  Physical Activity: Not on file  Stress: Not on file  Social Connections: Not on file  Intimate Partner Violence: Not At Risk (01/06/2023)   Humiliation, Afraid, Rape, and Kick questionnaire    Fear of Current or Ex-Partner: No    Emotionally Abused: No    Physically Abused: No    Sexually Abused: No    Family History: Family History  Problem Relation Age of Onset   Heart failure Mother    Diabetes Father    Hypertension Father    Stroke Sister 50   Diabetes Brother    Cancer - Colon Other        age 55   Colon polyps Neg Hx     Current Medications:  Current Outpatient Medications:    blood glucose meter kit and supplies KIT, Dispense based on patient and insurance preference. Use up to four times daily as directed. (FOR ICD-10 E11.65) Number of Strips 400 Number of Lancets 400, Disp: 1 each, Rfl: 0   Cholecalciferol (VITAMIN D3) 5000 units TABS, Take 5,000 Units by mouth daily., Disp: , Rfl:    cyanocobalamin (VITAMIN B12) 1000 MCG/ML injection, B12 shots at a frequency of once weekly for 4 weeks then can drop down to once monthly thereafter., Disp: , Rfl:    cyclobenzaprine (FLEXERIL) 10 MG tablet, Take 10 mg by mouth 3 (three) times daily as needed for muscle spasms., Disp: , Rfl:    dextrose 5 % SOLN 1,000 mL with fluorouracil 5 GM/100ML SOLN, Inject into the vein over 48 hr. Every 14 days, Disp: , Rfl:    docusate sodium (COLACE) 100 MG capsule, Take 1 capsule (100 mg total) by mouth 2 (two) times daily. Do NOT take this if you have diarrhea.  Try to maintain soft easy to pass bowel movements., Disp: 10 capsule, Rfl: 0   DROPLET PEN NEEDLES 31G X 5 MM MISC, USE AS INSTRUCTED TO  INJECT INSULIN DAILY, Disp: 100 each, Rfl: 3   FLUOROURACIL IV, Inject into the vein every 14 (fourteen) days., Disp: , Rfl:    FLUoxetine (PROZAC) 20 MG capsule, Take 20 mg by mouth daily., Disp: , Rfl:  folic acid (FOLVITE) 1 MG tablet, Take 1 tablet (1 mg total) by mouth daily., Disp: 30 tablet, Rfl: 6   HYDROcodone-acetaminophen (NORCO) 5-325 MG tablet, Take 1 tablet by mouth every 6 (six) hours as needed for severe pain., Disp: 30 tablet, Rfl: 0   insulin glargine (LANTUS SOLOSTAR) 100 UNIT/ML Solostar Pen, Inject 60 Units into the skin at bedtime., Disp: 30 mL, Rfl: 0   IRINOTECAN HCL IV, Inject into the vein every 14 (fourteen) days., Disp: , Rfl:    LEUCOVORIN CALCIUM IV, Inject into the vein every 14 (fourteen) days., Disp: , Rfl:    levothyroxine (SYNTHROID) 88 MCG tablet, TAKE 1 TABLET EVERY DAY BEFORE BREAKFAST, Disp: 90 tablet, Rfl: 1   lidocaine-prilocaine (EMLA) cream, Apply to affected area once, Disp: 30 g, Rfl: 3   loperamide (IMODIUM) 2 MG capsule, Take 1 capsule (2 mg total) by mouth as needed for diarrhea or loose stools., Disp: 30 capsule, Rfl: 0   magnesium oxide (MAG-OX) 400 (240 Mg) MG tablet, Take 1 tablet (400 mg total) by mouth 2 (two) times daily., Disp: 60 tablet, Rfl: 2   ondansetron (ZOFRAN-ODT) 4 MG disintegrating tablet, Take 2 tablets (8 mg total) by mouth every 8 (eight) hours as needed for refractory nausea / vomiting. Take in between doses of Compazine to maximize symptom relief., Disp: 60 tablet, Rfl: 1   OXALIPLATIN IV, Inject into the vein every 14 (fourteen) days., Disp: , Rfl:    pantoprazole (PROTONIX) 40 MG tablet, Take 1 tablet (40 mg total) by mouth 2 (two) times daily before a meal., Disp: 60 tablet, Rfl: 3   Potassium Chloride 40 MEQ/15ML (20%) SOLN, Take 15 mLs by mouth in the morning and at bedtime for 2 days, THEN 15 mLs daily., Disp: 900 mL, Rfl: 2   pregabalin (LYRICA) 100 MG capsule, Take 1 capsule (100 mg total) by mouth 2 (two) times daily.,  Disp: 180 capsule, Rfl: 1   prochlorperazine (COMPAZINE) 10 MG tablet, Take 1 tablet (10 mg total) by mouth every 6 (six) hours as needed for nausea or vomiting., Disp: 30 tablet, Rfl: 3   sucralfate (CARAFATE) 1 GM/10ML suspension, Take 10 mLs (1 g total) by mouth 4 (four) times daily. Swish and swallow, Disp: 420 mL, Rfl: 2   tirzepatide (MOUNJARO) 10 MG/0.5ML Pen, Inject 10 mg into the skin once a week., Disp: 6 mL, Rfl: 1   TRUE METRIX BLOOD GLUCOSE TEST test strip, TEST BLOOD SUGAR TWICE DAILY, Disp: 200 strip, Rfl: 3 No current facility-administered medications for this visit.  Facility-Administered Medications Ordered in Other Visits:    0.9 %  sodium chloride infusion, , Intravenous, Continuous, Derek Jack, MD, Stopped at 02/17/23 1535   fluorouracil (ADRUCIL) 5,000 mg in sodium chloride 0.9 % 150 mL chemo infusion, 1,920 mg/m2 (Treatment Plan Recorded), Intravenous, 1 day or 1 dose, Derek Jack, MD, Infusion Verify at 02/17/23 1547   Allergies: Allergies  Allergen Reactions   Contrast Media [Iodinated Contrast Media] Other (See Comments)    Burning   Sulfa Antibiotics Nausea And Vomiting    Other reaction(s): GI Upset (intolerance) unknown   Doxepin Rash and Swelling   Tape Rash    REVIEW OF SYSTEMS:   Review of Systems  Constitutional:  Negative for chills, fatigue and fever.  HENT:   Negative for lump/mass, mouth sores, nosebleeds, sore throat and trouble swallowing.   Eyes:  Negative for eye problems.  Respiratory:  Negative for cough and shortness of breath.   Cardiovascular:  Negative for chest pain, leg swelling and palpitations.  Gastrointestinal:  Positive for nausea and vomiting. Negative for abdominal pain, constipation and diarrhea.  Genitourinary:  Negative for bladder incontinence, difficulty urinating, dysuria, frequency, hematuria and nocturia.   Musculoskeletal:  Negative for arthralgias, back pain, flank pain, myalgias and neck pain.   Skin:  Negative for itching and rash.  Neurological:  Positive for dizziness and numbness. Negative for headaches.  Hematological:  Does not bruise/bleed easily.  Psychiatric/Behavioral:  Positive for sleep disturbance. Negative for depression and suicidal ideas. The patient is nervous/anxious.   All other systems reviewed and are negative.    VITALS:   There were no vitals taken for this visit.  Wt Readings from Last 3 Encounters:  02/17/23 264 lb 6.4 oz (119.9 kg)  02/11/23 259 lb 14.8 oz (117.9 kg)  02/08/23 268 lb 4.8 oz (121.7 kg)    There is no height or weight on file to calculate BMI.  Performance status (ECOG): 1 - Symptomatic but completely ambulatory  PHYSICAL EXAM:   Physical Exam Vitals and nursing note reviewed. Exam conducted with a chaperone present.  Constitutional:      Appearance: Normal appearance.  Cardiovascular:     Rate and Rhythm: Normal rate and regular rhythm.     Pulses: Normal pulses.     Heart sounds: Normal heart sounds.  Pulmonary:     Effort: Pulmonary effort is normal.     Breath sounds: Normal breath sounds.  Abdominal:     Palpations: Abdomen is soft. There is no hepatomegaly, splenomegaly or mass.     Tenderness: There is no abdominal tenderness.  Musculoskeletal:     Right lower leg: No edema.     Left lower leg: No edema.  Lymphadenopathy:     Cervical: No cervical adenopathy.     Right cervical: No superficial, deep or posterior cervical adenopathy.    Left cervical: No superficial, deep or posterior cervical adenopathy.     Upper Body:     Right upper body: No supraclavicular or axillary adenopathy.     Left upper body: No supraclavicular or axillary adenopathy.  Neurological:     General: No focal deficit present.     Mental Status: She is alert and oriented to person, place, and time.  Psychiatric:        Mood and Affect: Mood normal.        Behavior: Behavior normal.     LABS:      Latest Ref Rng & Units 02/17/2023     7:41 AM 02/15/2023    8:07 AM 02/12/2023    9:58 AM  CBC  WBC 4.0 - 10.5 K/uL 6.5  5.6  1.7   Hemoglobin 12.0 - 15.0 g/dL 8.9  8.8  8.3   Hematocrit 36.0 - 46.0 % 28.2  27.1  25.2   Platelets 150 - 400 K/uL 154  169  154       Latest Ref Rng & Units 02/17/2023    7:41 AM 02/15/2023    8:07 AM 02/12/2023    9:58 AM  CMP  Glucose 70 - 99 mg/dL 87  89  87   BUN 8 - 23 mg/dL 7  7  7    Creatinine 0.44 - 1.00 mg/dL 0.96  0.92  0.79   Sodium 135 - 145 mmol/L 139  140  135   Potassium 3.5 - 5.1 mmol/L 2.9  2.5  2.5   Chloride 98 - 111 mmol/L 108  105  104  CO2 22 - 32 mmol/L 24  25  24    Calcium 8.9 - 10.3 mg/dL 8.1  8.3  7.9   Total Protein 6.5 - 8.1 g/dL 5.8  5.8  6.0   Total Bilirubin 0.3 - 1.2 mg/dL 0.6  0.7  0.8   Alkaline Phos 38 - 126 U/L 74  69  66   AST 15 - 41 U/L 20  19  19    ALT 0 - 44 U/L 13  14  17     Lab Results  Component Value Date   CEA1 3.8 12/16/2022   /  CEA  Date Value Ref Range Status  12/16/2022 3.8 0.0 - 4.7 ng/mL Final    Comment:    (NOTE)                             Nonsmokers          <3.9                             Smokers             <5.6 [employer] Electrochemiluminescence Immunoassay (ECLIA) Values obtained with different assay methods or kits cannot be used interchangeably.  Results cannot be interpreted as absolute evidence of the presence or absence of malignant disease. Performed At: [facility] [place], [place] [id number] Rush Farmer MD [id number]    No results found for: "PSA1" No results found for: "CAN199" No results found for: "CAN125"  No results found for: "TOTALPROTELP", "ALBUMINELP", "A1GS", "A2GS", "BETS", "BETA2SER", "GAMS", "MSPIKE", "SPEI" Lab Results  Component Value Date   TIBC 370 01/04/2023   FERRITIN 4 (L) 01/04/2023   IRONPCTSAT 4 (L) 01/04/2023   Lab Results  Component Value Date   LDH 136 01/04/2023   STUDIES:   No results found.

## 2023-02-18 ENCOUNTER — Other Ambulatory Visit: Payer: Self-pay | Admitting: Nurse Practitioner

## 2023-02-18 NOTE — Therapy (Deleted)
OUTPATIENT PHYSICAL THERAPY FEMALE PELVIC EVALUATION   Patient Name: [name] MRN: [id number] DOB:July 15, 1956, 67 y.o., female Today's Date: 02/18/2023  END OF SESSION:   Past Medical History:  Diagnosis Date   Anxiety    Coronary atherosclerosis    Cardiac CT 09/2018 with calcium score 8.7 and mild proximal LAD disease   Essential hypertension 2009   GERD (gastroesophageal reflux disease)    Hypothyroidism    Iron deficiency anemia    Osteoarthritis    Palpitations    Tachypalpitations on beta blockers since 2010   Type 2 diabetes mellitus ([place]) 2003   Vitamin B 12 deficiency    Vitamin D deficiency    Past Surgical History:  Procedure Laterality Date   ABDOMINAL HYSTERECTOMY     History of cervical cancer   BIOPSY  12/16/2022   Procedure: BIOPSY;  Surgeon: Daneil Dolin, MD;  [facility];  Service: Endoscopy;;  gastric, rectal   BREAST BIOPSY Right    COLONOSCOPY WITH PROPOFOL N/A 12/16/2022   Procedure: COLONOSCOPY WITH PROPOFOL;  Surgeon: Daneil Dolin, MD;  [facility];  Service: Endoscopy;  Laterality: N/A;  10:45 am   ESOPHAGOGASTRODUODENOSCOPY (EGD) WITH PROPOFOL N/A 12/16/2022   Procedure: ESOPHAGOGASTRODUODENOSCOPY (EGD) WITH PROPOFOL;  Surgeon: Daneil Dolin, MD;  [facility];  Service: Endoscopy;  Laterality: N/A;   IR IMAGING GUIDED PORT INSERTION  01/15/2023   MALONEY DILATION N/A 12/16/2022   Procedure: Venia Minks DILATION;  Surgeon: Daneil Dolin, MD;  [facility];  Service: Endoscopy;  Laterality: N/A;   POLYPECTOMY  12/16/2022   Procedure: POLYPECTOMY;  Surgeon: Daneil Dolin, MD;  [facility];  Service: Endoscopy;;   TONSILLECTOMY     Patient Active Problem List   Diagnosis Date Noted   Rectal bleeding 01/27/2023   Acute blood loss anemia 01/27/2023   Pancytopenia ([place]) 01/27/2023   Rectal cancer ([place]) 01/03/2023   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   Salmonella  enteritis 09/12/2018   Vomiting and diarrhea 09/11/2018   Hypokalemia 09/11/2018   Hyponatremia 09/11/2018   Gastroenteritis 09/11/2018   Chest pain 08/17/2018   Mild aortic stenosis 08/17/2018   Iron deficiency anemia 10/05/2017   Recurrent major depressive disorder, in partial remission ([place]) 10/06/2016   Mixed hyperlipidemia 02/01/2016   Hyperthyroidism 02/01/2016   Arthritis 02/01/2016   GERD (gastroesophageal reflux disease) 02/01/2016   Hypertension 02/01/2016   Uncontrolled type 2 diabetes mellitus with peripheral neuropathy 02/01/2016   Vitamin D deficiency 02/01/2016   Palpitations 04/03/2011   Morbid obesity ([place]) 04/03/2011   Essential hypertension, benign 04/03/2011   Type 2 diabetes mellitus with complication, with long-term current use of insulin ([place]) 04/03/2011    PCP: Nickola Major, MD  REFERRING PROVIDER: Hayden Pedro, PA-C   REFERRING DIAG: C20 (ICD-10-CM) - Rectal cancer ([place])   THERAPY DIAG:  No diagnosis found.  Rationale for Evaluation and Treatment: Rehabilitation  ONSET DATE: 01/03/23  SUBJECTIVE:  SUBJECTIVE STATEMENT: STAGE IIC RECTAL CANCER , intermittent pelvic pain; chenmotherapy 01/20/23 Fluid intake: {Yes/No:304960894}   PAIN:  Are you having pain? {yes/no:20286} NPRS scale: ***/10 Pain location: {pelvic pain location:27098}  Pain type: {type:313116} Pain description: {PAIN DESCRIPTION:21022940}   Aggravating factors: *** Relieving factors: ***  PRECAUTIONS: Other: rectal cancer  WEIGHT BEARING RESTRICTIONS: No  FALLS:  Has patient fallen in last 6 months? {fallsyesno:27318}  LIVING ENVIRONMENT: Lives with: {OPRC lives with:25569::"lives with their family"} Lives in: {Lives in:25570} Stairs: {opstairs:27293} Has following  equipment at home: {Assistive devices:23999}  OCCUPATION: ***  PLOF: {PLOF:24004}  PATIENT GOALS: ***  PERTINENT HISTORY:  Peripheral neuropathy from diabetes; Hypothyroidism; Abdominal Hysterectomy; cervical cancer;  Sexual abuse: {Yes/No:304960894}  BOWEL MOVEMENT: Pain with bowel movement: {yes/no:20286} Type of bowel movement:{PT BM type:27100} Fully empty rectum: {Yes/No:304960894} Leakage: {Yes/No:304960894} Pads: {Yes/No:304960894} Fiber supplement: {Yes/No:304960894}  URINATION: Pain with urination: {yes/no:20286} Fully empty bladder: {Yes/No:304960894} Stream: {PT urination:27102} Urgency: {Yes/No:304960894} Frequency: *** Leakage: {PT leakage:27103} Pads: {Yes/No:304960894}  INTERCOURSE: Pain with intercourse: {pain with intercourse PA:27099} Ability to have vaginal penetration:  {Yes/No:304960894} Climax: *** Marinoff Scale: ***/3  PREGNANCY: Vaginal deliveries *** Tearing {Yes***/No:304960894} C-section deliveries *** Currently pregnant {Yes***/No:304960894}  PROLAPSE: {PT prolapse:27101}   OBJECTIVE:   DIAGNOSTIC FINDINGS:  ***  PATIENT SURVEYS:  {rehab surveys:24030}  PFIQ-7 ***  COGNITION: Overall cognitive status: {cognition:24006}     SENSATION: Light touch: {intact/deficits:24005} Proprioception: {intact/deficits:24005}  MUSCLE LENGTH: Hamstrings: Right *** deg; Left *** deg Thomas test: Right *** deg; Left *** deg  LUMBAR SPECIAL TESTS:  {lumbar special test:25242}  FUNCTIONAL TESTS:  {Functional tests:24029}  GAIT: Distance walked: *** Assistive device utilized: {Assistive devices:23999} Level of assistance: {Levels of assistance:24026} Comments: ***  POSTURE: {posture:25561}  PELVIC ALIGNMENT:  LUMBARAROM/PROM:  A/PROM A/PROM  eval  Flexion   Extension   Right lateral flexion   Left lateral flexion   Right rotation   Left rotation    (Blank rows = not tested)  LOWER EXTREMITY ROM:  {AROM/PROM:27142}  ROM Right eval Left eval  Hip flexion    Hip extension    Hip abduction    Hip adduction    Hip internal rotation    Hip external rotation    Knee flexion    Knee extension    Ankle dorsiflexion    Ankle plantarflexion    Ankle inversion    Ankle eversion     (Blank rows = not tested)  LOWER EXTREMITY MMT:  MMT Right eval Left eval  Hip flexion    Hip extension    Hip abduction    Hip adduction    Hip internal rotation    Hip external rotation    Knee flexion    Knee extension    Ankle dorsiflexion    Ankle plantarflexion    Ankle inversion    Ankle eversion     PALPATION:   General  ***                External Perineal Exam ***                             Internal Pelvic Floor ***  Patient confirms identification and approves PT to assess internal pelvic floor and treatment {yes/no:20286}  PELVIC MMT:   MMT eval  Vaginal   Internal Anal Sphincter   External Anal Sphincter   Puborectalis   Diastasis Recti   (Blank rows = not tested)  TONE: ***  PROLAPSE: ***  TODAY'S TREATMENT:                                                                                                                              DATE: ***  EVAL ***   PATIENT EDUCATION:  Education details: *** Person educated: {Person educated:25204} Education method: {Education Method:25205} Education comprehension: {Education Comprehension:25206}  HOME EXERCISE PROGRAM: ***  ASSESSMENT:  CLINICAL IMPRESSION: Patient is a *** y.o. *** who was seen today for physical therapy evaluation and treatment for ***.   OBJECTIVE IMPAIRMENTS: {opptimpairments:25111}.   ACTIVITY LIMITATIONS: {activitylimitations:27494}  PARTICIPATION LIMITATIONS: {participationrestrictions:25113}  PERSONAL FACTORS: {Personal factors:25162} are also affecting patient's functional outcome.   REHAB POTENTIAL: {rehabpotential:25112}  CLINICAL DECISION MAKING: {clinical decision  making:25114}  EVALUATION COMPLEXITY: {Evaluation complexity:25115}   GOALS: Goals reviewed with patient? {yes/no:20286}  SHORT TERM GOALS: Target date: ***  *** Baseline: Goal status: {GOALSTATUS:25110}  2.  *** Baseline:  Goal status: {GOALSTATUS:25110}  3.  *** Baseline:  Goal status: {GOALSTATUS:25110}  4.  *** Baseline:  Goal status: {GOALSTATUS:25110}  5.  *** Baseline:  Goal status: {GOALSTATUS:25110}  6.  *** Baseline:  Goal status: {GOALSTATUS:25110}  LONG TERM GOALS: Target date: ***  *** Baseline:  Goal status: {GOALSTATUS:25110}  2.  *** Baseline:  Goal status: {GOALSTATUS:25110}  3.  *** Baseline:  Goal status: {GOALSTATUS:25110}  4.  *** Baseline:  Goal status: {GOALSTATUS:25110}  5.  *** Baseline:  Goal status: {GOALSTATUS:25110}  6.  *** Baseline:  Goal status: {GOALSTATUS:25110}  PLAN:  PT FREQUENCY: {rehab frequency:25116}  PT DURATION: {rehab duration:25117}  PLANNED INTERVENTIONS: {rehab planned interventions:25118::"Therapeutic exercises","Therapeutic activity","Neuromuscular re-education","Balance training","Gait training","Patient/Family education","Self Care","Joint mobilization"}  PLAN FOR NEXT SESSION: ***   Breunna Nordmann, PT 02/18/2023, 10:47 AM

## 2023-02-19 ENCOUNTER — Inpatient Hospital Stay: Payer: Medicare HMO

## 2023-02-19 VITALS — BP 115/75 | HR 82 | Temp 97.4°F | Resp 20

## 2023-02-19 DIAGNOSIS — C20 Malignant neoplasm of rectum: Secondary | ICD-10-CM

## 2023-02-19 DIAGNOSIS — Z5111 Encounter for antineoplastic chemotherapy: Secondary | ICD-10-CM | POA: Diagnosis not present

## 2023-02-19 MED ORDER — SODIUM CHLORIDE 0.9% FLUSH
10.0000 mL | INTRAVENOUS | Status: DC | PRN
  Administered 2023-02-19: 10 mL

## 2023-02-19 MED ORDER — PEGFILGRASTIM-CBQV 6 MG/0.6ML ~~LOC~~ SOSY
6.0000 mg | PREFILLED_SYRINGE | Freq: Once | SUBCUTANEOUS | Status: AC
  Administered 2023-02-19: 6 mg via SUBCUTANEOUS
  Filled 2023-02-19: qty 0.6

## 2023-02-19 MED ORDER — HEPARIN SOD (PORK) LOCK FLUSH 100 UNIT/ML IV SOLN
500.0000 [IU] | Freq: Once | INTRAVENOUS | Status: AC | PRN
  Administered 2023-02-19: 500 [IU]

## 2023-02-19 NOTE — Patient Instructions (Signed)
[facility]  Discharge Instructions: Thank you for choosing [facility] to provide your oncology and hematology care.  If you have a lab appointment with the Cancer Center, please come in thru the Main Entrance and check in at the main information desk.  Wear comfortable clothing and clothing appropriate for easy access to any Portacath or PICC line.   We strive to give you quality time with your provider. You may need to reschedule your appointment if you arrive late (15 or more minutes).  Arriving late affects you and other patients whose appointments are after yours.  Also, if you miss three or more appointments without notifying the office, you may be dismissed from the clinic at the provider's discretion.      For prescription refill requests, have your pharmacy contact our office and allow 72 hours for refills to be completed.    Today you received the following chemotherapy and/or immunotherapy agents Udenyca.  Pegfilgrastim Injection What is this medication? PEGFILGRASTIM (PEG fil gra stim) lowers the risk of infection in people who are receiving chemotherapy. It works by helping your body make more white blood cells, which protects your body from infection. It may also be used to help people who have been exposed to high doses of radiation. This medicine may be used for other purposes; ask your health care provider or pharmacist if you have questions. COMMON BRAND NAME(S): Fulphila, Fylnetra, Neulasta, Nyvepria, Stimufend, UDENYCA, Ziextenzo What should I tell my care team before I take this medication? They need to know if you have any of these conditions: Kidney disease Latex allergy Ongoing radiation therapy Sickle cell disease Skin reactions to acrylic adhesives (On-Body Injector only) An unusual or allergic reaction to pegfilgrastim, filgrastim, other medications, foods, dyes, or preservatives Pregnant or trying to get  pregnant Breast-feeding How should I use this medication? This medication is for injection under the skin. If you get this medication at home, you will be taught how to prepare and give the pre-filled syringe or how to use the On-body Injector. Refer to the patient Instructions for Use for detailed instructions. Use exactly as directed. Tell your care team immediately if you suspect that the On-body Injector may not have performed as intended or if you suspect the use of the On-body Injector resulted in a missed or partial dose. It is important that you put your used needles and syringes in a special sharps container. Do not put them in a trash can. If you do not have a sharps container, call your pharmacist or care team to get one. Talk to your care team about the use of this medication in children. While this medication may be prescribed for selected conditions, precautions do apply. Overdosage: If you think you have taken too much of this medicine contact a poison control center or emergency room at once. NOTE: This medicine is only for you. Do not share this medicine with others. What if I miss a dose? It is important not to miss your dose. Call your care team if you miss your dose. If you miss a dose due to an On-body Injector failure or leakage, a new dose should be administered as soon as possible using a single prefilled syringe for manual use. What may interact with this medication? Interactions have not been studied. This list may not describe all possible interactions. Give your health care provider a list of all the medicines, herbs, non-prescription drugs, or dietary supplements you use. Also tell   them if you smoke, drink alcohol, or use illegal drugs. Some items may interact with your medicine. What should I watch for while using this medication? Your condition will be monitored carefully while you are receiving this medication. You may need blood work done while you are taking this  medication. Talk to your care team about your risk of cancer. You may be more at risk for certain types of cancer if you take this medication. If you are going to need a MRI, CT scan, or other procedure, tell your care team that you are using this medication (On-Body Injector only). What side effects may I notice from receiving this medication? Side effects that you should report to your care team as soon as possible: Allergic reactions--skin rash, itching, hives, swelling of the face, lips, tongue, or throat Capillary leak syndrome--stomach or muscle pain, unusual weakness or fatigue, feeling faint or lightheaded, decrease in the amount of urine, swelling of the ankles, hands, or feet, trouble breathing High white blood cell level--fever, fatigue, trouble breathing, night sweats, change in vision, weight loss Inflammation of the aorta--fever, fatigue, back, chest, or stomach pain, severe headache Kidney injury (glomerulonephritis)--decrease in the amount of urine, red or dark Bernadetta Roell urine, foamy or bubbly urine, swelling of the ankles, hands, or feet Shortness of breath or trouble breathing Spleen injury--pain in upper left stomach or shoulder Unusual bruising or bleeding Side effects that usually do not require medical attention (report to your care team if they continue or are bothersome): Bone pain Pain in the hands or feet This list may not describe all possible side effects. Call your doctor for medical advice about side effects. You may report side effects to FDA at 1-800-FDA-1088. Where should I keep my medication? Keep out of the reach of children. If you are using this medication at home, you will be instructed on how to store it. Throw away any unused medication after the expiration date on the label. NOTE: This sheet is a summary. It may not cover all possible information. If you have questions about this medicine, talk to your doctor, pharmacist, or health care provider.  2023  Elsevier/Gold Standard (2021-07-25 00:00:00)        To help prevent nausea and vomiting after your treatment, we encourage you to take your nausea medication as directed.  BELOW ARE SYMPTOMS THAT SHOULD BE REPORTED IMMEDIATELY: *FEVER GREATER THAN 100.4 F (38 C) OR HIGHER *CHILLS OR SWEATING *NAUSEA AND VOMITING THAT IS NOT CONTROLLED WITH YOUR NAUSEA MEDICATION *UNUSUAL SHORTNESS OF BREATH *UNUSUAL BRUISING OR BLEEDING *URINARY PROBLEMS (pain or burning when urinating, or frequent urination) *BOWEL PROBLEMS (unusual diarrhea, constipation, pain near the anus) TENDERNESS IN MOUTH AND THROAT WITH OR WITHOUT PRESENCE OF ULCERS (sore throat, sores in mouth, or a toothache) UNUSUAL RASH, SWELLING OR PAIN  UNUSUAL VAGINAL DISCHARGE OR ITCHING   Items with * indicate a potential emergency and should be followed up as soon as possible or go to the Emergency Department if any problems should occur.  Please show the CHEMOTHERAPY ALERT CARD or IMMUNOTHERAPY ALERT CARD at check-in to the Emergency Department and triage nurse.  Should you have questions after your visit or need to cancel or reschedule your appointment, please contact [facility] [phone]  and follow the prompts.  Office hours are 8:00 a.m. to 4:30 p.m. Monday - Friday. Please note that voicemails left after 4:00 p.m. may not be returned until the following business day.  We are closed weekends and major   holidays. You have access to a nurse at all times for urgent questions. Please call the main number to the clinic [phone] and follow the prompts.  For any non-urgent questions, you may also contact your provider using MyChart. We now offer e-Visits for anyone 18 and older to request care online for non-urgent symptoms. For details visit [email or web address].   Also download the MyChart app! Go to the app store, search "MyChart", open the app, select [place], and log in with your MyChart username and  password.   

## 2023-02-19 NOTE — Progress Notes (Signed)
Patients port flushed without difficulty.  Good blood return noted with no bruising or swelling noted at site.  Band aid applied.  Patient tolerated Udenyca injection with no complaints voiced.  Site clean and dry with no bruising or swelling noted.  No complaints of pain.  Discharged with vital signs stable and no signs or symptoms of distress noted.

## 2023-02-21 ENCOUNTER — Emergency Department (HOSPITAL_COMMUNITY): Payer: Medicare HMO

## 2023-02-21 ENCOUNTER — Encounter (HOSPITAL_COMMUNITY): Payer: Self-pay | Admitting: *Deleted

## 2023-02-21 ENCOUNTER — Other Ambulatory Visit: Payer: Self-pay

## 2023-02-21 ENCOUNTER — Emergency Department (HOSPITAL_COMMUNITY)
Admission: EM | Admit: 2023-02-21 | Discharge: 2023-02-21 | Disposition: A | Discharge status: Home / Self Care | Payer: Medicare HMO | Attending: Emergency Medicine | Admitting: Emergency Medicine

## 2023-02-21 DIAGNOSIS — R112 Nausea with vomiting, unspecified: Secondary | ICD-10-CM | POA: Diagnosis present

## 2023-02-21 DIAGNOSIS — D72829 Elevated white blood cell count, unspecified: Secondary | ICD-10-CM | POA: Diagnosis not present

## 2023-02-21 DIAGNOSIS — K5732 Diverticulitis of large intestine without perforation or abscess without bleeding: Secondary | ICD-10-CM | POA: Insufficient documentation

## 2023-02-21 DIAGNOSIS — I10 Essential (primary) hypertension: Secondary | ICD-10-CM | POA: Insufficient documentation

## 2023-02-21 DIAGNOSIS — Z85048 Personal history of other malignant neoplasm of rectum, rectosigmoid junction, and anus: Secondary | ICD-10-CM | POA: Diagnosis not present

## 2023-02-21 DIAGNOSIS — K5792 Diverticulitis of intestine, part unspecified, without perforation or abscess without bleeding: Secondary | ICD-10-CM

## 2023-02-21 DIAGNOSIS — E119 Type 2 diabetes mellitus without complications: Secondary | ICD-10-CM | POA: Insufficient documentation

## 2023-02-21 DIAGNOSIS — Z79899 Other long term (current) drug therapy: Secondary | ICD-10-CM | POA: Diagnosis not present

## 2023-02-21 DIAGNOSIS — R000 Tachycardia, unspecified: Secondary | ICD-10-CM | POA: Insufficient documentation

## 2023-02-21 HISTORY — DX: Malignant (primary) neoplasm, unspecified: C80.1

## 2023-02-21 LAB — COMPREHENSIVE METABOLIC PANEL
ALT: 18 U/L (ref 0–44)
AST: 41 U/L (ref 15–41)
Albumin: 2.9 g/dL — ABNORMAL LOW (ref 3.5–5.0)
Alkaline Phosphatase: 103 U/L (ref 38–126)
Anion gap: 7 (ref 5–15)
BUN: 11 mg/dL (ref 8–23)
CO2: 25 mmol/L (ref 22–32)
Calcium: 8 mg/dL — ABNORMAL LOW (ref 8.9–10.3)
Chloride: 104 mmol/L (ref 98–111)
Creatinine, Ser: 0.85 mg/dL (ref 0.44–1.00)
GFR, Estimated: >60 mL/min (ref >60)
Glucose, Bld: 141 mg/dL — ABNORMAL HIGH (ref 70–99)
Potassium: 3.3 mmol/L — ABNORMAL LOW (ref 3.5–5.1)
Sodium: 136 mmol/L (ref 135–145)
Total Bilirubin: 0.8 mg/dL (ref 0.3–1.2)
Total Protein: 5.7 g/dL — ABNORMAL LOW (ref 6.5–8.1)

## 2023-02-21 LAB — CBC WITH DIFFERENTIAL/PLATELET
Abs Immature Granulocytes: 0 10*3/uL (ref 0.00–0.07)
Band Neutrophils: 4 %
Basophils Absolute: 0 10*3/uL (ref 0.0–0.1)
Basophils Relative: 0 %
Eosinophils Absolute: 0 10*3/uL (ref 0.0–0.5)
Eosinophils Relative: 0 %
HCT: 26.7 % — ABNORMAL LOW (ref 36.0–46.0)
Hemoglobin: 8.5 g/dL — ABNORMAL LOW (ref 12.0–15.0)
Lymphocytes Relative: 6 %
Lymphs Abs: 2.2 10*3/uL (ref 0.7–4.0)
MCH: 27.1 pg (ref 26.0–34.0)
MCHC: 31.8 g/dL (ref 30.0–36.0)
MCV: 85 fL (ref 80.0–100.0)
Monocytes Absolute: 0.4 10*3/uL (ref 0.1–1.0)
Monocytes Relative: 1 %
Neutro Abs: 34 10*3/uL — ABNORMAL HIGH (ref 1.7–7.7)
Neutrophils Relative %: 89 %
Platelets: 135 10*3/uL — ABNORMAL LOW (ref 150–400)
RBC: 3.14 MIL/uL — ABNORMAL LOW (ref 3.87–5.11)
RDW: 20.9 % — ABNORMAL HIGH (ref 11.5–15.5)
WBC: 36.6 10*3/uL — ABNORMAL HIGH (ref 4.0–10.5)
nRBC: 0 % (ref 0.0–0.2)

## 2023-02-21 LAB — TROPONIN I (HIGH SENSITIVITY)
Troponin I (High Sensitivity): 7 ng/L (ref <18)
Troponin I (High Sensitivity): 6 ng/L (ref <18)

## 2023-02-21 LAB — LACTIC ACID, PLASMA
Lactic Acid, Venous: 1.3 mmol/L (ref 0.5–1.9)
Lactic Acid, Venous: 1.2 mmol/L (ref 0.5–1.9)

## 2023-02-21 LAB — LIPASE, BLOOD: Lipase: 26 U/L (ref 11–51)

## 2023-02-21 MED ORDER — DIPHENHYDRAMINE HCL 50 MG/ML IJ SOLN
12.5000 mg | Freq: Once | INTRAMUSCULAR | Status: AC
  Administered 2023-02-21: 12.5 mg via INTRAVENOUS
  Filled 2023-02-21: qty 1

## 2023-02-21 MED ORDER — LACTATED RINGERS IV BOLUS
1000.0000 mL | Freq: Once | INTRAVENOUS | Status: AC
  Administered 2023-02-21: 1000 mL via INTRAVENOUS

## 2023-02-21 MED ORDER — METOCLOPRAMIDE HCL 5 MG/ML IJ SOLN
10.0000 mg | Freq: Once | INTRAMUSCULAR | Status: AC
  Administered 2023-02-21: 10 mg via INTRAVENOUS
  Filled 2023-02-21: qty 2

## 2023-02-21 MED ORDER — AMOXICILLIN-POT CLAVULANATE 875-125 MG PO TABS: 1.0000 | ORAL_TABLET | Freq: Two times a day (BID) | ORAL | 0 refills | Status: DC

## 2023-02-21 MED ORDER — AMOXICILLIN-POT CLAVULANATE 875-125 MG PO TABS
1.0000 | ORAL_TABLET | Freq: Once | ORAL | Status: AC
  Administered 2023-02-21: 1 via ORAL
  Filled 2023-02-21: qty 1

## 2023-02-21 MED ORDER — METOCLOPRAMIDE HCL 10 MG PO TABS: 10.0000 mg | ORAL_TABLET | Freq: Four times a day (QID) | ORAL | 0 refills | Status: DC

## 2023-02-21 MED ORDER — ONDANSETRON HCL 4 MG/2ML IJ SOLN
4.0000 mg | Freq: Once | INTRAMUSCULAR | Status: AC
  Administered 2023-02-21: 4 mg via INTRAVENOUS
  Filled 2023-02-21: qty 2

## 2023-02-21 NOTE — ED Triage Notes (Signed)
Pt with emesis on Wednesday, had chemo on Wednesday for rectal cancer.  Denies any pain. Loss of appetite.

## 2023-02-21 NOTE — ED Provider Notes (Signed)
[place] EMERGENCY DEPARTMENT AT [facility] Provider Note   CSN: [id number] Arrival date & time: 02/21/23  1304     History  Chief Complaint  Patient presents with   Emesis    [name] is a 67 y.o. female.   Emesis    Patient is a 67 year old female history of hypertension, diabetes, GERD, rectal cancer on chemotherapy presenting to the emergency department due to emesis.  Patient had her third round of chemotherapy on Wednesday of this week, for the last 5 days she has been unable to level to eat or drink due to vomiting.  She has had similar reactions after the second round of chemotherapy unfortunately.  She is not having any abdominal pain, chest pain, shortness of breath, fevers, diarrhea, lower extremity swelling.  She just feels dehydrated and globally weak.  Home Medications Prior to Admission medications   Medication Sig Start Date End Date Taking? Authorizing Provider  amoxicillin-clavulanate (AUGMENTIN) 875-125 MG tablet Take 1 tablet by mouth every 12 (twelve) hours. 02/21/23  Yes Theron Arista, PA-C  metoCLOPramide (REGLAN) 10 MG tablet Take 1 tablet (10 mg total) by mouth every 6 (six) hours. 02/21/23  Yes Theron Arista, PA-C  blood glucose meter kit and supplies KIT Dispense based on patient and insurance preference. Use up to four times daily as directed. (FOR ICD-10 E11.65) Number of Strips 400 Number of Lancets 400 02/16/23   Roma Kayser, MD  Cholecalciferol (VITAMIN D3) 5000 units TABS Take 5,000 Units by mouth daily.    [provider]  cyanocobalamin (VITAMIN B12) 1000 MCG/ML injection B12 shots at a frequency of once weekly for 4 weeks then can drop down to once monthly thereafter. 10/22/22   [provider]  cyclobenzaprine (FLEXERIL) 10 MG tablet Take 10 mg by mouth 3 (three) times daily as needed for muscle spasms. 01/08/22   [provider]  dextrose 5 % SOLN 1,000 mL with fluorouracil 5 GM/100ML SOLN Inject  into the vein over 48 hr. Every 14 days 01/20/23   [provider]  docusate sodium (COLACE) 100 MG capsule Take 1 capsule (100 mg total) by mouth 2 (two) times daily. Do NOT take this if you have diarrhea.  Try to maintain soft easy to pass bowel movements. 01/27/23   Carnella Guadalajara, PA-C  DROPLET PEN NEEDLES 31G X 5 MM MISC USE AS INSTRUCTED TO INJECT INSULIN DAILY 05/18/22   Dani Gobble, NP  FLUOROURACIL IV Inject into the vein every 14 (fourteen) days. 01/20/23   [provider]  FLUoxetine (PROZAC) 20 MG capsule Take 20 mg by mouth daily.    [provider]  folic acid (FOLVITE) 1 MG tablet Take 1 tablet (1 mg total) by mouth daily. 01/20/23   [name], MD  HYDROcodone-acetaminophen (NORCO) 5-325 MG tablet Take 1 tablet by mouth every 6 (six) hours as needed for severe pain. 01/27/23   Carnella Guadalajara, PA-C  insulin glargine (LANTUS SOLOSTAR) 100 UNIT/ML Solostar Pen Inject 60 Units into the skin at bedtime. 08/11/22   Dani Gobble, NP  IRINOTECAN HCL IV Inject into the vein every 14 (fourteen) days. 01/20/23   [provider]  LEUCOVORIN CALCIUM IV Inject into the vein every 14 (fourteen) days. 01/20/23   [provider]  levothyroxine (SYNTHROID) 88 MCG tablet TAKE 1 TABLET EVERY DAY BEFORE BREAKFAST 11/10/22   Dani Gobble, NP  lidocaine-prilocaine (EMLA) cream Apply to affected area once 01/19/23   [name],  MD  loperamide (IMODIUM) 2 MG capsule Take 1 capsule (2 mg total) by mouth as needed for diarrhea or loose stools. 02/08/23   [name], MD  magnesium oxide (MAG-OX) 400 (240 Mg) MG tablet Take 1 tablet (400 mg total) by mouth 2 (two) times daily. 02/12/23   Carnella Guadalajara, PA-C  ondansetron (ZOFRAN-ODT) 4 MG disintegrating tablet Take 2 tablets (8 mg total) by mouth every 8 (eight) hours as needed for refractory nausea / vomiting. Take in between doses of Compazine to maximize symptom  relief. 02/12/23   Carnella Guadalajara, PA-C  OXALIPLATIN IV Inject into the vein every 14 (fourteen) days. 01/20/23   [provider]  pantoprazole (PROTONIX) 40 MG tablet Take 1 tablet (40 mg total) by mouth 2 (two) times daily before a meal. 01/26/23   Mahon, Frederik Schmidt, NP  Potassium Chloride 40 MEQ/15ML (20%) SOLN Take 15 mLs by mouth in the morning and at bedtime for 2 days, THEN 15 mLs daily. 02/12/23 03/16/23  Carnella Guadalajara, PA-C  pregabalin (LYRICA) 100 MG capsule Take 1 capsule (100 mg total) by mouth 2 (two) times daily. 12/24/22   Dani Gobble, NP  prochlorperazine (COMPAZINE) 10 MG tablet Take 1 tablet (10 mg total) by mouth every 6 (six) hours as needed for nausea or vomiting. 01/13/23   [name], MD  sucralfate (CARAFATE) 1 GM/10ML suspension Take 10 mLs (1 g total) by mouth 4 (four) times daily. Swish and swallow 02/12/23   Rojelio Brenner M, PA-C  tirzepatide [facility]) 10 MG/0.5ML Pen INJECT 10MG  (1 PEN) UNDER THE SKIN ONE TIME WEEKLY 02/18/23   Dani Gobble, NP  TRUE METRIX BLOOD GLUCOSE TEST test strip TEST BLOOD SUGAR TWICE DAILY 11/10/22   Roma Kayser, MD      Allergies    Contrast media [iodinated contrast media], Sulfa antibiotics, Doxepin, and Tape    Review of Systems   Review of Systems  Gastrointestinal:  Positive for vomiting.    Physical Exam Updated Vital Signs BP 106/74   Pulse 90   Temp 98.1 F (36.7 C) (Oral)   Resp 17   Ht 5\' 7"  (1.702 m)   Wt 119.7 kg   SpO2 100%   BMI 41.35 kg/m  Physical Exam Vitals and nursing note reviewed. Exam conducted with a chaperone present.  Constitutional:      Appearance: Normal appearance.  HENT:     Head: Normocephalic and atraumatic.     Mouth/Throat:     Mouth: Mucous membranes are dry.  Eyes:     General: No scleral icterus.       Right eye: No discharge.        Left eye: No discharge.     Extraocular Movements: Extraocular movements intact.     Pupils:  Pupils are equal, round, and reactive to light.  Cardiovascular:     Rate and Rhythm: Regular rhythm. Tachycardia present.     Pulses: Normal pulses.     Heart sounds: Normal heart sounds.     No friction rub. No gallop.  Pulmonary:     Effort: Pulmonary effort is normal. No respiratory distress.     Breath sounds: Normal breath sounds.  Abdominal:     General: Abdomen is flat. Bowel sounds are normal. There is no distension.     Palpations: Abdomen is soft.     Tenderness: There is no abdominal tenderness.     Comments: Abdomen is soft and nontender  Skin:  General: Skin is warm and dry.     Coloration: Skin is pale. Skin is not jaundiced.  Neurological:     Mental Status: She is alert. Mental status is at baseline.     Coordination: Coordination normal.     ED Results / Procedures / Treatments   Labs (all labs ordered are listed, but only abnormal results are displayed) Labs Reviewed  COMPREHENSIVE METABOLIC PANEL - Abnormal; Notable for the following components:      Result Value   Potassium 3.3 (*)    Glucose, Bld 141 (*)    Calcium 8.0 (*)    Total Protein 5.7 (*)    Albumin 2.9 (*)    All other components within normal limits  CBC WITH DIFFERENTIAL/PLATELET - Abnormal; Notable for the following components:   WBC 36.6 (*)    RBC 3.14 (*)    Hemoglobin 8.5 (*)    HCT 26.7 (*)    RDW 20.9 (*)    Platelets 135 (*)    Neutro Abs 34.0 (*)    All other components within normal limits  CULTURE, BLOOD (ROUTINE X 2)  CULTURE, BLOOD (ROUTINE X 2)  LACTIC ACID, PLASMA  LACTIC ACID, PLASMA  LIPASE, BLOOD  TROPONIN I (HIGH SENSITIVITY)  TROPONIN I (HIGH SENSITIVITY)    EKG EKG Interpretation  Date/Time:  Sunday February 21 2023 14:02:00 EDT Ventricular Rate:  94 PR Interval:  163 QRS Duration: 93 QT Interval:  400 QTC Calculation: 501 R Axis:   -9 Text Interpretation: Sinus rhythm Atrial premature complex Borderline T abnormalities, anterior leads Prolonged QT  interval No significant change since prior 1/24 Confirmed by Meridee Score [phone]) on 02/21/2023 2:06:06 PM  Radiology CT ABDOMEN PELVIS WO CONTRAST  Result Date: 02/21/2023 CLINICAL DATA:  Emesis, bowel obstruction, rectal cancer, chemotherapy * Tracking Code: BO * EXAM: CT ABDOMEN AND PELVIS WITHOUT CONTRAST TECHNIQUE: Multidetector CT imaging of the abdomen and pelvis was performed following the standard protocol without IV contrast. RADIATION DOSE REDUCTION: This exam was performed according to the departmental dose-optimization program which includes automated exposure control, adjustment of the mA and/or kV according to patient size and/or use of iterative reconstruction technique. COMPARISON:  PET-CT, 12/31/2022 FINDINGS: Lower chest: No acute abnormality. Left coronary artery calcifications. Hepatobiliary: No solid liver abnormality is seen. Coarse contour of the faintly calcified gallstones. No gallbladder wall thickening, or biliary dilatation. Pancreas: Unremarkable. No pancreatic ductal dilatation or surrounding inflammatory changes. Spleen: Normal in size without significant abnormality. Adrenals/Urinary Tract: Adrenal glands are unremarkable. Unchanged partially exophytic lesion of the posterior midportion of the right kidney measuring 2.9 x 2.6 cm (series 3, image 41). Bladder is unremarkable. Stomach/Bowel: Stomach is within normal limits. Appendix is not clearly visualized. Unchanged left eccentric rectal mass (series 3, image 95). Colon is fluid-filled to the rectum. Sigmoid diverticulosis. Mild wall thickening of the sigmoid colon (series 3, image 82). No evidence of bowel obstruction. Vascular/Lymphatic: Scattered aortic atherosclerosis. No enlarged abdominal or pelvic lymph nodes. Reproductive: Status post hysterectomy. Other: Small, fat containing right inguinal hernia.  No ascites. Musculoskeletal: No acute or significant osseous findings. IMPRESSION: 1. Colon is fluid-filled to the  rectum, consistent with diarrheal illness. Sigmoid diverticulosis with mild wall thickening of the sigmoid colon, suggesting mild diverticulitis. 2. No evidence of bowel obstruction. 3. Unchanged rectal mass. 4. 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 Nonemergent, outpatient MRI may be  helpful to more definitively characterize this lesion if desired in the setting of known primary rectal malignancy. 5. Cholelithiasis. 6. Coronary artery disease. Aortic Atherosclerosis (ICD10-I70.0). Electronically Signed   By: Jearld Lesch M.D.   On: 02/21/2023 15:07   DG Chest Portable 1 View  Result Date: 02/21/2023 CLINICAL DATA:  Weakness. On active chemotherapy. EXAM: PORTABLE CHEST 1 VIEW COMPARISON:  Radiograph 06/27/2021. CT 12/17/2022 FINDINGS: Right chest port with tip overlying the jugular caval junction. 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 Overall low lung volumes which limits assessment. No obvious focal airspace disease, pleural effusion or pneumothorax. IMPRESSION: Low lung volumes limit assessment. 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 Allowing for limitations, no acute findings Electronically Signed   By: Narda Rutherford M.D.   On: 02/21/2023 14:25    Procedures Procedures    Medications Ordered in ED Medications  lactated ringers bolus 1,000 mL (0 mLs Intravenous Stopped 02/21/23 1714)  metoCLOPramide (REGLAN) injection 10 mg (10 mg Intravenous Given 02/21/23 1514)  diphenhydrAMINE (BENADRYL) injection 12.5 mg (12.5 mg Intravenous Given 02/21/23 1514)  lactated ringers bolus 1,000 mL (0 mLs Intravenous Stopped 02/21/23 1859)  amoxicillin-clavulanate (AUGMENTIN) 875-125 MG per tablet 1 tablet (1 tablet Oral  Given 02/21/23 1808)  ondansetron (ZOFRAN) injection 4 mg (4 mg Intravenous Given 02/21/23 1817)    ED Course/ Medical Decision Making/ A&P Clinical Course as of 02/21/23 2150  Sun Feb 21, 2023  1405 Pegfilgrastim infusion 02/19/23 [HS]  1406 Folfirinox infusion 02/17/23 [HS]  1406 ECG not crossing in epic.  Normal sinus rhythm rate of 94 nonspecific T wave changes no significant change from 1/24 [MB]  3749 67 year old female here with nausea and vomiting in the setting of chemotherapy for rectal cancer.  Usually follows at [facility] and has had the symptoms before with her chemotherapy.  She is mildly tachycardic otherwise fairly benign exam.  Getting labs and imaging.  Disposition per results of testing [MB]  1523 DG Chest Portable 1 View [HS]  1523 CT ABDOMEN PELVIS WO CONTRAST Mild diverticulitis  [HS]  1716 Consulted with on-call oncologist Dr. Ellin Saba.  He would recommend outpatient Augmentin, given afebrile, not septic does not need to be inpatient for IV antibiotics.  Agrees the leukocytosis secondary to oncologic medications.  Appreciate his consult. [HS]  1743 Patient has had 2 episodes of diarrhea while in the ED.  No blood or mucus. [HS]  1745 CBC with Differential(!) Leukocytosis, not neutropenic. [HS]  1745 Lactic acid, plasma Not elevated [HS]  1745 Troponin I (High Sensitivity) Within normal limits [HS]  1847 I reevaluate the patient once more.  She is tolerating p.o., was able to tolerate the Augmentin.  Will discharge home at this time. [HS]    Clinical Course User Index [HS] Theron Arista, PA-C [MB] Terrilee Files, MD                             Medical Decision Making Amount and/or Complexity of Data Reviewed Labs: ordered. Decision-making details documented in ED Course. Radiology: ordered. Decision-making details documented in ED Course.  Risk Prescription drug management.   This is a 67 year old female presenting to the emergency department due to emesis.   Differential includes reaction to chemotherapy, dehydration, AKI, electrolyte derangement, metabolic acidosis or alkalosis.  History provided by the patient and family member who is at bedside.  Also viewed external medical records at home medications.    Based on history and physical exam I am mostly concerned about dehydration secondary to chemotherapy induced emesis.  Her abdomen is soft and nontender, I do not really have a higher suspicion for intra-abdominal process.  Does not meet SIRS criteria, afebrile,  clinically patient does not appear sepsis.  Will start with labs, EKG and bolus of LR.   Please see ED course for interpretation of studies, consults, interpretation of laboratory workup.  To briefly summarized patient will be discharged with Reglan for nausea and vomiting which I think is most likely secondary to chemotherapy.  She is having diarrhea and evidence of diverticulitis on the CT scan.    Will p.o. challenge with Augmentin, if able to tolerate p.o. will discharge home with close outpatient follow-up.  Patient is tolerating p.o., stable for outpatient follow-up at this time.         Final Clinical Impression(s) / ED Diagnoses Final diagnoses:  Nausea and vomiting, unspecified vomiting type  Diverticulitis    Rx / DC Orders ED Discharge Orders          Ordered    metoCLOPramide (REGLAN) 10 MG tablet  Every 6 hours        02/21/23 1743    amoxicillin-clavulanate (AUGMENTIN) 875-125 MG tablet  Every 12 hours        02/21/23 1744              Theron Arista, PA-C 02/21/23 2150    Terrilee Files, MD 02/22/23 1820

## 2023-02-21 NOTE — Discharge Instructions (Addendum)
You are seen today in the emergency department for nausea and vomiting.  Please take Reglan every 6 hours as needed for nausea at home.  You can add this to regimen if Compazine and Zofran are not working as well.  He also had signs of diverticulitis on your CT scan, for this acute follow-up clear liquid diet for the next 24 to 48 hours.  Take Augmentin the antibiotic twice daily for 5 days.  Your first dose was given to you here in the ED.  Return to the emergency department if you are unable to eat or drink without vomiting, fevers, severe abdominal pain, new or concerning symptoms.

## 2023-02-22 ENCOUNTER — Other Ambulatory Visit: Payer: Self-pay

## 2023-02-22 ENCOUNTER — Ambulatory Visit: Payer: Medicare HMO | Admitting: Physical Therapy

## 2023-02-22 ENCOUNTER — Inpatient Hospital Stay: Payer: Medicare HMO | Attending: Hematology | Admitting: Dietician

## 2023-02-22 ENCOUNTER — Emergency Department (HOSPITAL_COMMUNITY)
Admission: EM | Admit: 2023-02-22 | Discharge: 2023-02-22 | Disposition: A | Discharge status: Home / Self Care | Payer: Medicare HMO | Attending: Emergency Medicine | Admitting: Emergency Medicine

## 2023-02-22 ENCOUNTER — Encounter (HOSPITAL_COMMUNITY): Payer: Self-pay

## 2023-02-22 DIAGNOSIS — C20 Malignant neoplasm of rectum: Secondary | ICD-10-CM | POA: Insufficient documentation

## 2023-02-22 DIAGNOSIS — Z79899 Other long term (current) drug therapy: Secondary | ICD-10-CM | POA: Insufficient documentation

## 2023-02-22 DIAGNOSIS — E119 Type 2 diabetes mellitus without complications: Secondary | ICD-10-CM | POA: Diagnosis not present

## 2023-02-22 DIAGNOSIS — D500 Iron deficiency anemia secondary to blood loss (chronic): Secondary | ICD-10-CM | POA: Insufficient documentation

## 2023-02-22 DIAGNOSIS — E860 Dehydration: Secondary | ICD-10-CM | POA: Insufficient documentation

## 2023-02-22 DIAGNOSIS — I10 Essential (primary) hypertension: Secondary | ICD-10-CM | POA: Insufficient documentation

## 2023-02-22 DIAGNOSIS — Z794 Long term (current) use of insulin: Secondary | ICD-10-CM | POA: Insufficient documentation

## 2023-02-22 DIAGNOSIS — Z5111 Encounter for antineoplastic chemotherapy: Secondary | ICD-10-CM | POA: Insufficient documentation

## 2023-02-22 DIAGNOSIS — K625 Hemorrhage of anus and rectum: Secondary | ICD-10-CM | POA: Insufficient documentation

## 2023-02-22 DIAGNOSIS — E876 Hypokalemia: Secondary | ICD-10-CM

## 2023-02-22 DIAGNOSIS — Z8639 Personal history of other endocrine, nutritional and metabolic disease: Secondary | ICD-10-CM

## 2023-02-22 DIAGNOSIS — Z5189 Encounter for other specified aftercare: Secondary | ICD-10-CM | POA: Insufficient documentation

## 2023-02-22 DIAGNOSIS — R110 Nausea: Secondary | ICD-10-CM | POA: Diagnosis not present

## 2023-02-22 DIAGNOSIS — R197 Diarrhea, unspecified: Secondary | ICD-10-CM | POA: Insufficient documentation

## 2023-02-22 LAB — LIPASE, BLOOD: Lipase: 29 U/L (ref 11–51)

## 2023-02-22 LAB — COMPREHENSIVE METABOLIC PANEL
ALT: 18 U/L (ref 0–44)
AST: 35 U/L (ref 15–41)
Albumin: 2.7 g/dL — ABNORMAL LOW (ref 3.5–5.0)
Alkaline Phosphatase: 104 U/L (ref 38–126)
Anion gap: 7 (ref 5–15)
BUN: 9 mg/dL (ref 8–23)
CO2: 25 mmol/L (ref 22–32)
Calcium: 7.8 mg/dL — ABNORMAL LOW (ref 8.9–10.3)
Chloride: 104 mmol/L (ref 98–111)
Creatinine, Ser: 0.86 mg/dL (ref 0.44–1.00)
GFR, Estimated: >60 mL/min (ref >60)
Glucose, Bld: 136 mg/dL — ABNORMAL HIGH (ref 70–99)
Potassium: 3 mmol/L — ABNORMAL LOW (ref 3.5–5.1)
Sodium: 136 mmol/L (ref 135–145)
Total Bilirubin: 0.8 mg/dL (ref 0.3–1.2)
Total Protein: 5.5 g/dL — ABNORMAL LOW (ref 6.5–8.1)

## 2023-02-22 LAB — CBC
HCT: 24.5 % — ABNORMAL LOW (ref 36.0–46.0)
Hemoglobin: 7.9 g/dL — ABNORMAL LOW (ref 12.0–15.0)
MCH: 27.5 pg (ref 26.0–34.0)
MCHC: 32.2 g/dL (ref 30.0–36.0)
MCV: 85.4 fL (ref 80.0–100.0)
Platelets: 106 10*3/uL — ABNORMAL LOW (ref 150–400)
RBC: 2.87 MIL/uL — ABNORMAL LOW (ref 3.87–5.11)
RDW: 20.6 % — ABNORMAL HIGH (ref 11.5–15.5)
WBC: 24.9 10*3/uL — ABNORMAL HIGH (ref 4.0–10.5)
nRBC: 0 % (ref 0.0–0.2)

## 2023-02-22 MED ORDER — LACTATED RINGERS IV BOLUS
1000.0000 mL | Freq: Once | INTRAVENOUS | Status: AC
  Administered 2023-02-22: 1000 mL via INTRAVENOUS

## 2023-02-22 MED ORDER — ONDANSETRON 8 MG PO TBDP
8.0000 mg | ORAL_TABLET | Freq: Once | ORAL | Status: AC
  Administered 2023-02-22: 8 mg via ORAL
  Filled 2023-02-22: qty 1

## 2023-02-22 MED ORDER — POTASSIUM CHLORIDE 40 MEQ/15ML (20%) PO SOLN: ORAL | 2 refills | Status: DC

## 2023-02-22 MED ORDER — MAGNESIUM SULFATE 2 GM/50ML IV SOLN
2.0000 g | Freq: Once | INTRAVENOUS | Status: AC
  Administered 2023-02-22: 2 g via INTRAVENOUS
  Filled 2023-02-22: qty 50

## 2023-02-22 MED ORDER — POTASSIUM CHLORIDE 10 MEQ/100ML IV SOLN
10.0000 meq | Freq: Once | INTRAVENOUS | Status: AC
  Administered 2023-02-22: 10 meq via INTRAVENOUS
  Filled 2023-02-22: qty 100

## 2023-02-22 MED ORDER — POTASSIUM CHLORIDE 20 MEQ PO PACK
40.0000 meq | PACK | Freq: Every day | ORAL | Status: DC
  Administered 2023-02-22: 40 meq via ORAL
  Filled 2023-02-22: qty 2

## 2023-02-22 MED ORDER — HEPARIN SOD (PORK) LOCK FLUSH 100 UNIT/ML IV SOLN
500.0000 [IU] | Freq: Once | INTRAVENOUS | Status: AC
  Administered 2023-02-22: 500 [IU]
  Filled 2023-02-22: qty 5

## 2023-02-22 NOTE — ED Notes (Signed)
Pt aware of the need for UA. [employer] RN

## 2023-02-22 NOTE — ED Notes (Signed)
Pt ambulated to restroom. Urine sample was mixed with stool. Unable to send specimen. Carrie Corona Edd Fabian

## 2023-02-22 NOTE — Progress Notes (Signed)
Nutrition Follow-up:  Patient with rectal cancer. She is receiving modified FOLFIRINOX q14d (start 2/28)  Noted recent ED visits for nausea/vomiting (3/31 & 4/1)  Spoke with patient via telephone. She reports ongoing nausea. Patient is taking zofran. She reports following liquid diet x48 hours as advised in ED for diverticulitis. Patient says she will be able to advance diet tomorrow. She reports "little improvement" to side effects with modified folfirinox. Patient states the first week was slightly better, but second week is difficult. Appetite is "real poor." Patient has no interest in food. She reports cold sensitivity lasting treatment to treatment. This has made drinking water challenging as she only likes ice cold water. Patient recalls consuming ~20 ounces of water. She is also drinking sunkist as well as 7-up. Patient is planning to try Ensure samples this evening. States she finally remembered to take them out of the fridge. Patient is having loose bowel movements 4-6 times daily. Sometimes these are formed.   Medications: augmentin, zofran-odt, mag-ox, reglan, carafate, mounjaro   Labs: 4/1 - Hgb 7.9, K 3.0, glucose 136, albumin 2.7  Anthropometrics: Wt 264 lb today decreased   3/7 - 272 lb 14.9 oz 2/28 - 291 lb 3.2 oz  9% (-27 lbs) in 5 weeks - severe (of note pt on mounjaro)   NUTRITION DIAGNOSIS: Food and nutrition related knowledge deficit    INTERVENTION:  Pt on 48 hour liquid diet d/t mild diverticulitis per ED MD. Educated on bland foods (boiled chicken, potatoes, rice, peanut butter crackers) with diet advancement Encouraged taking antiemetics as prescribed. She has not started carafate or reglan, agreeable to try medications with diet advancement Pt planning to try Ensure samples. Recommend drinking TID as tolerated Complimentary case left in nursing office for pt to pick up 4/2 Suggested hot chocolate made with whole milk/Ensure + stove top pudding as warm supplement  ideas Consider holding mounjaro given significant wt loss. ? If current side effects are being exacerbated by medication    MONITORING, EVALUATION, GOAL: weight trends, intake    NEXT VISIT: Monday April 29 via telephone

## 2023-02-22 NOTE — ED Provider Notes (Signed)
[name]   CSN: [id number] Arrival date & time: 02/22/23  0402     History  Chief Complaint  Patient presents with   Nausea    [name] is a 67 y.o. female.  67 year old female with a history ofhypertension, diabetes, GERD, rectal cancer on chemotherapy who presents ER today secondary to nausea.  Patient was seen in the ER yesterday and was feeling better.  She was sent home yesterday afternoon and was doing okay when she went to sleep.  She woke up around 2 or 3:00 and started having nausea and was spitting up.  It is difficult to tell from her description if she was dry heaving or just had watery mouth but either way she did not try anything and came here for further evaluation.  She felt like she was dying.  At this time she feels better.  Did spit in the bag once earlier.        Home Medications Prior to Admission medications   Medication Sig Start Date End Date Taking? Authorizing Provider  amoxicillin-clavulanate (AUGMENTIN) 875-125 MG tablet Take 1 tablet by mouth every 12 (twelve) hours. 02/21/23   Sherrill Raring, PA-C  blood glucose meter kit and supplies KIT Dispense based on patient and insurance preference. Use up to four times daily as directed. (FOR ICD-10 E11.65) Number of Strips 400 Number of Lancets 400 02/16/23   Cassandria Anger, MD  Cholecalciferol (VITAMIN D3) 5000 units TABS Take 5,000 Units by mouth daily.    [provider]  cyanocobalamin (VITAMIN B12) 1000 MCG/ML injection B12 shots at a frequency of once weekly for 4 weeks then can drop down to once monthly thereafter. 10/22/22   [provider]  cyclobenzaprine (FLEXERIL) 10 MG tablet Take 10 mg by mouth 3 (three) times daily as needed for muscle spasms. 01/08/22   [provider]  dextrose 5 % SOLN 1,000 mL with fluorouracil 5 GM/100ML SOLN Inject into the vein over 48 hr. Every 14 days 01/20/23   [provider]  docusate sodium (COLACE) 100 MG capsule Take 1 capsule (100 mg total) by mouth 2 (two) times daily. Do NOT take this if you have diarrhea.  Try to maintain soft easy to pass bowel movements. 01/27/23   Harriett Rush, PA-C  DROPLET PEN NEEDLES 31G X 5 MM MISC USE AS INSTRUCTED TO INJECT INSULIN DAILY 05/18/22   Brita Romp, NP  FLUOROURACIL IV Inject into the vein every 14 (fourteen) days. 01/20/23   [provider]  FLUoxetine (PROZAC) 20 MG capsule Take 20 mg by mouth daily.    [provider]  folic acid (FOLVITE) 1 MG tablet Take 1 tablet (1 mg total) by mouth daily. 01/20/23   Derek Jack, MD  HYDROcodone-acetaminophen (NORCO) 5-325 MG tablet Take 1 tablet by mouth every 6 (six) hours as needed for severe pain. 01/27/23   Harriett Rush, PA-C  insulin glargine (LANTUS SOLOSTAR) 100 UNIT/ML Solostar Pen Inject 60 Units into the skin at bedtime. 08/11/22   Brita Romp, NP  IRINOTECAN HCL IV Inject into the vein every 14 (fourteen) days. 01/20/23   [provider]  LEUCOVORIN CALCIUM IV Inject into the vein every 14 (fourteen) days. 01/20/23   [provider]  levothyroxine (SYNTHROID) 88 MCG tablet TAKE 1 TABLET EVERY DAY BEFORE BREAKFAST 11/10/22   Brita Romp, NP  lidocaine-prilocaine (EMLA) cream Apply to affected area once 01/19/23  Derek Jack, MD  loperamide (IMODIUM) 2 MG capsule Take 1 capsule (2 mg total) by mouth as needed for diarrhea or loose stools. 02/08/23   Derek Jack, MD  magnesium oxide (MAG-OX) 400 (240 Mg) MG tablet Take 1 tablet (400 mg total) by mouth 2 (two) times daily. 02/12/23   Harriett Rush, PA-C  metoCLOPramide (REGLAN) 10 MG tablet Take 1 tablet (10 mg total) by mouth every 6 (six) hours. 02/21/23   Sherrill Raring, PA-C  ondansetron (ZOFRAN-ODT) 4 MG disintegrating tablet Take 2 tablets (8 mg total) by mouth every 8 (eight) hours as needed for refractory nausea /  vomiting. Take in between doses of Compazine to maximize symptom relief. 02/12/23   Harriett Rush, PA-C  OXALIPLATIN IV Inject into the vein every 14 (fourteen) days. 01/20/23   [provider]  pantoprazole (PROTONIX) 40 MG tablet Take 1 tablet (40 mg total) by mouth 2 (two) times daily before a meal. 01/26/23   Mahon, Lenise Arena, NP  Potassium Chloride 40 MEQ/15ML (20%) SOLN Take 15 mLs by mouth in the morning and at bedtime for 2 days, THEN 15 mLs daily. 02/22/23 03/26/23  Deondrea Markos, Corene Cornea, MD  pregabalin (LYRICA) 100 MG capsule Take 1 capsule (100 mg total) by mouth 2 (two) times daily. 12/24/22   Brita Romp, NP  prochlorperazine (COMPAZINE) 10 MG tablet Take 1 tablet (10 mg total) by mouth every 6 (six) hours as needed for nausea or vomiting. 01/13/23   Derek Jack, MD  sucralfate (CARAFATE) 1 GM/10ML suspension Take 10 mLs (1 g total) by mouth 4 (four) times daily. Swish and swallow 02/12/23   Tarri Abernethy M, PA-C  tirzepatide [facility]) 10 MG/0.5ML Pen INJECT 10MG  (1 PEN) UNDER THE SKIN ONE TIME WEEKLY 02/18/23   Brita Romp, NP  TRUE METRIX BLOOD GLUCOSE TEST test strip TEST BLOOD SUGAR TWICE DAILY 11/10/22   Cassandria Anger, MD      Allergies    Contrast media [iodinated contrast media], Sulfa antibiotics, Doxepin, and Tape    Review of Systems   Review of Systems  Physical Exam Updated Vital Signs BP 136/77   Pulse 90   Temp 98.1 F (36.7 C) (Oral)   Resp 11   Ht 5\' 7"  (1.702 m)   Wt 119.7 kg   SpO2 97%   BMI 41.35 kg/m  Physical Exam Vitals and nursing Manning reviewed.  Constitutional:      Appearance: She is well-developed.  HENT:     Head: Normocephalic and atraumatic.  Eyes:     Pupils: Pupils are equal, round, and reactive to light.  Cardiovascular:     Rate and Rhythm: Normal rate and regular rhythm.  Pulmonary:     Effort: No respiratory distress.     Breath sounds: No stridor.  Abdominal:     General: There is no  distension.  Musculoskeletal:        General: No swelling or tenderness. Normal range of motion.     Cervical back: Normal range of motion.  Skin:    General: Skin is warm and dry.  Neurological:     General: No focal deficit present.     Mental Status: She is alert.     ED Results / Procedures / Treatments   Labs (all labs ordered are listed, but only abnormal results are displayed) Labs Reviewed  COMPREHENSIVE METABOLIC PANEL - Abnormal; Notable for the following components:      Result Value   Potassium 3.0 (*)  Glucose, Bld 136 (*)    Calcium 7.8 (*)    Total Protein 5.5 (*)    Albumin 2.7 (*)    All other components within normal limits  CBC - Abnormal; Notable for the following components:   WBC 24.9 (*)    RBC 2.87 (*)    Hemoglobin 7.9 (*)    HCT 24.5 (*)    RDW 20.6 (*)    Platelets 106 (*)    All other components within normal limits  LIPASE, BLOOD  URINALYSIS, ROUTINE W REFLEX MICROSCOPIC    EKG None  Radiology CT ABDOMEN PELVIS WO CONTRAST  Result Date: 02/21/2023 CLINICAL DATA:  Emesis, bowel obstruction, rectal cancer, chemotherapy * Tracking Code: BO * EXAM: CT ABDOMEN AND PELVIS WITHOUT CONTRAST TECHNIQUE: Multidetector CT imaging of the abdomen and pelvis was performed following the standard protocol without IV contrast. RADIATION DOSE REDUCTION: This exam was performed according to the departmental dose-optimization program which includes automated exposure control, adjustment of the mA and/or kV according to patient size and/or use of iterative reconstruction technique. COMPARISON:  PET-CT, 12/31/2022 FINDINGS: Lower chest: No acute abnormality. Left coronary artery calcifications. Hepatobiliary: No solid liver abnormality is seen. Coarse contour of the faintly calcified gallstones. No gallbladder wall thickening, or biliary dilatation. Pancreas: Unremarkable. No pancreatic ductal dilatation or surrounding inflammatory changes. Spleen: Normal in size  without significant abnormality. Adrenals/Urinary Tract: Adrenal glands are unremarkable. Unchanged partially exophytic lesion of the posterior midportion of the right kidney measuring 2.9 x 2.6 cm (series 3, image 41). Bladder is unremarkable. Stomach/Bowel: Stomach is within normal limits. Appendix is not clearly visualized. Unchanged left eccentric rectal mass (series 3, image 95). Colon is fluid-filled to the rectum. Sigmoid diverticulosis. Mild wall thickening of the sigmoid colon (series 3, image 82). No evidence of bowel obstruction. Vascular/Lymphatic: Scattered aortic atherosclerosis. No enlarged abdominal or pelvic lymph nodes. Reproductive: Status post hysterectomy. Other: Small, fat containing right inguinal hernia.  No ascites. Musculoskeletal: No acute or significant osseous findings. IMPRESSION: 1. Colon is fluid-filled to the rectum, consistent with diarrheal illness. Sigmoid diverticulosis with mild wall thickening of the sigmoid colon, suggesting mild diverticulitis. 2. No evidence of bowel obstruction. 3. Unchanged rectal mass. 4. 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 Nonemergent, outpatient MRI may be helpful to more definitively characterize this lesion if desired in the setting of known primary rectal malignancy. 5. Cholelithiasis. 6. Coronary artery disease. Aortic Atherosclerosis (ICD10-I70.0). Electronically Signed   By: Delanna Ahmadi M.D.   On: 02/21/2023 15:07   DG Chest Portable 1 View  Result Date: 02/21/2023 CLINICAL DATA:  Weakness. On active chemotherapy. EXAM: PORTABLE CHEST 1 VIEW COMPARISON:  Radiograph 06/27/2021. CT 12/17/2022 FINDINGS: Right chest port with tip overlying the jugular caval junction. 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  Overall low lung volumes which limits assessment. No obvious focal airspace disease, pleural effusion or pneumothorax. IMPRESSION: Low lung volumes limit assessment. 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 Allowing for limitations, no acute findings Electronically Signed   By: Keith Rake M.D.   On: 02/21/2023 14:25    Procedures Procedures    Medications Ordered in ED Medications  potassium chloride (KLOR-CON) packet 40 mEq (40 mEq Oral Given 02/22/23 0644)  potassium chloride 10 mEq in 100 mL IVPB (  10 mEq Intravenous New Bag/Given 02/22/23 0625)  magnesium sulfate IVPB 2 g 50 mL (2 g Intravenous New Bag/Given 02/22/23 0626)  lactated ringers bolus 1,000 mL (0 mLs Intravenous Stopped 02/22/23 0642)  ondansetron (ZOFRAN-ODT) disintegrating tablet 8 mg (8 mg Oral Given 02/22/23 0459)    ED Course/ Medical Decision Making/ A&P                             Medical Decision Making Amount and/or Complexity of Data Reviewed Labs: ordered.  Risk Prescription drug management.  67 year old female seen yesterday with labs and CT scan that were unremarkable basically.  She did have mild diverticulitis but is not really here with much pain.  I think she was worried about that and that her nausea was coming back so she presented here.  Improved with Zofran.  She is tolerating p.o.  Potassium still low so we will give her IV round and some liquid potassium she has liquid potassium at home as well.  She will follow-up with her outpatient doctors to get that rechecked.  I do not see any indication for hospitalization at this time.  She has Zofran at home.  Final Clinical Impression(s) / ED Diagnoses Final diagnoses:  Nausea  History of hypokalemia    Rx / DC Orders ED Discharge Orders          Ordered    Potassium Chloride 40 MEQ/15ML (20%) SOLN  Multiple Frequencies        02/22/23 0611              Conlan Miceli, Corene Cornea, MD 02/22/23 [phone]

## 2023-02-22 NOTE — ED Triage Notes (Addendum)
Patient arrives via EMS for nausea. Patient was seen here and diagnosis with diverticulitis. Patient was sent home with RX. Tonight patient states she became more nauseous with diarrhea. Has zofran at home, but has not taken it. Emesis is clear in color. Diagnosed with rectal cancer in January.

## 2023-02-23 ENCOUNTER — Inpatient Hospital Stay: Payer: Medicare HMO

## 2023-02-23 VITALS — BP 131/76 | HR 83 | Temp 97.7°F | Resp 20

## 2023-02-23 DIAGNOSIS — K625 Hemorrhage of anus and rectum: Secondary | ICD-10-CM | POA: Diagnosis not present

## 2023-02-23 DIAGNOSIS — E876 Hypokalemia: Secondary | ICD-10-CM | POA: Diagnosis not present

## 2023-02-23 DIAGNOSIS — C20 Malignant neoplasm of rectum: Secondary | ICD-10-CM | POA: Diagnosis present

## 2023-02-23 DIAGNOSIS — D509 Iron deficiency anemia, unspecified: Secondary | ICD-10-CM

## 2023-02-23 DIAGNOSIS — R197 Diarrhea, unspecified: Secondary | ICD-10-CM | POA: Diagnosis not present

## 2023-02-23 DIAGNOSIS — D500 Iron deficiency anemia secondary to blood loss (chronic): Secondary | ICD-10-CM | POA: Diagnosis not present

## 2023-02-23 DIAGNOSIS — E860 Dehydration: Secondary | ICD-10-CM | POA: Diagnosis not present

## 2023-02-23 DIAGNOSIS — Z5189 Encounter for other specified aftercare: Secondary | ICD-10-CM | POA: Diagnosis not present

## 2023-02-23 DIAGNOSIS — Z79899 Other long term (current) drug therapy: Secondary | ICD-10-CM | POA: Diagnosis not present

## 2023-02-23 DIAGNOSIS — Z5111 Encounter for antineoplastic chemotherapy: Secondary | ICD-10-CM | POA: Diagnosis present

## 2023-02-23 MED ORDER — SODIUM CHLORIDE 0.9 % IV SOLN
400.0000 mg | Freq: Once | INTRAVENOUS | Status: AC
  Administered 2023-02-23: 400 mg via INTRAVENOUS
  Filled 2023-02-23: qty 20

## 2023-02-23 MED ORDER — CETIRIZINE HCL 10 MG PO TABS: 10.0000 mg | ORAL_TABLET | Freq: Once | ORAL | Status: DC

## 2023-02-23 MED ORDER — ACETAMINOPHEN 325 MG PO TABS: 650.0000 mg | ORAL_TABLET | Freq: Once | ORAL | Status: DC

## 2023-02-23 MED ORDER — SODIUM CHLORIDE 0.9 % IV SOLN: Freq: Once | INTRAVENOUS | Status: AC

## 2023-02-23 MED ORDER — SODIUM CHLORIDE 0.9% FLUSH
10.0000 mL | Freq: Once | INTRAVENOUS | Status: AC
  Administered 2023-02-23: 10 mL via INTRAVENOUS

## 2023-02-23 MED ORDER — HEPARIN SOD (PORK) LOCK FLUSH 100 UNIT/ML IV SOLN
500.0000 [IU] | Freq: Once | INTRAVENOUS | Status: AC
  Administered 2023-02-23: 500 [IU] via INTRAVENOUS

## 2023-02-23 NOTE — Progress Notes (Signed)
Patient presents today for Venofer infusion per providers order.  Vital signs WNL.  Patient has no new complaints at this time.  Patient took premedications at home.  Stable during infusion without adverse affects.  Vital signs stable.  No complaints at this time.  Discharge from clinic ambulatory in stable condition.  Alert and oriented X 3.  Follow up with [facility] as scheduled.  

## 2023-02-23 NOTE — Patient Instructions (Signed)
[facility]  Discharge Instructions: Thank you for choosing [facility] to provide your oncology and hematology care.  If you have a lab appointment with the Cancer Center, please come in thru the Main Entrance and check in at the main information desk.  Wear comfortable clothing and clothing appropriate for easy access to any Portacath or PICC line.   We strive to give you quality time with your provider. You may need to reschedule your appointment if you arrive late (15 or more minutes).  Arriving late affects you and other patients whose appointments are after yours.  Also, if you miss three or more appointments without notifying the office, you may be dismissed from the clinic at the provider's discretion.      For prescription refill requests, have your pharmacy contact our office and allow 72 hours for refills to be completed.    Today you received the following chemotherapy and/or immunotherapy agents Venofer      To help prevent nausea and vomiting after your treatment, we encourage you to take your nausea medication as directed.  BELOW ARE SYMPTOMS THAT SHOULD BE REPORTED IMMEDIATELY: *FEVER GREATER THAN 100.4 F (38 C) OR HIGHER *CHILLS OR SWEATING *NAUSEA AND VOMITING THAT IS NOT CONTROLLED WITH YOUR NAUSEA MEDICATION *UNUSUAL SHORTNESS OF BREATH *UNUSUAL BRUISING OR BLEEDING *URINARY PROBLEMS (pain or burning when urinating, or frequent urination) *BOWEL PROBLEMS (unusual diarrhea, constipation, pain near the anus) TENDERNESS IN MOUTH AND THROAT WITH OR WITHOUT PRESENCE OF ULCERS (sore throat, sores in mouth, or a toothache) UNUSUAL RASH, SWELLING OR PAIN  UNUSUAL VAGINAL DISCHARGE OR ITCHING   Items with * indicate a potential emergency and should be followed up as soon as possible or go to the Emergency Department if any problems should occur.  Please show the CHEMOTHERAPY ALERT CARD or IMMUNOTHERAPY ALERT CARD at check-in to the Emergency  Department and triage nurse.  Should you have questions after your visit or need to cancel or reschedule your appointment, please contact [facility] [phone]  and follow the prompts.  Office hours are 8:00 a.m. to 4:30 p.m. Monday - Friday. Please note that voicemails left after 4:00 p.m. may not be returned until the following business day.  We are closed weekends and major holidays. You have access to a nurse at all times for urgent questions. Please call the main number to the clinic [phone] and follow the prompts.  For any non-urgent questions, you may also contact your provider using MyChart. We now offer e-Visits for anyone 18 and older to request care online for non-urgent symptoms. For details visit [email or web address].   Also download the MyChart app! Go to the app store, search "MyChart", open the app, select [facility], and log in with your MyChart username and password.   

## 2023-02-23 NOTE — Progress Notes (Unsigned)
[place] [facility] [address] [place] [address] Phone: [phone] Fax: [phone]  SYMPTOM MANAGEMENT CLINIC PROGRESS NOTE   [name] CP:[id number] 24-Dec-1955 67 y.o.  [name] is managed by Dr. Delton Coombes for her stage IIc rectal cancer   Actively treated with chemotherapy/immunotherapy/hormonal therapy: YES   Current therapy: FOLFIRINOX   Last treated: Cycle #3 started 02/17/2023 with pump D/C and Udenyca on 02/19/2023  INTERVAL HISTORY:  Chief Complaint: Chemotherapy follow-up & symptom management visit  [name] reports feeling poorly after her third cycle of chemotherapy.  She reports that she usually feels "a little pick me up" for a day or 2 after chemo was given, but this time she felt like it "just knocked her back."  She reports extreme fatigue today.  She had ED visits on 02/21/2023 and 02/22/2023 for nausea and vomiting.  She vomited 3 times on Sunday, otherwise has had persistent nausea with some relief from Compazine and Zofran.  She reports "diarrhea" described as 3-4 soft bowel movements daily.  She has had very poor oral intake this week, drinking about 20 ounces of water daily and eating small amounts of pudding and Jell-O.  Her weight today is 265 pounds (stable over the past week).  She has a stable diabetic peripheral neuropathy, reports onset of cold sensitivity after her second cycle of oxaliplatin.    She denies any significant pelvic or rectal pain this week.  She has prescription for hydrocodone available  She denies any major rectal bleeding this week.  She continues to crave ice, but does not eat it due to cold sensitivity.  Previously reported mouth sore resolved after Carafate/lidocaine mouthwash.  No recurrent mouth sores at this time.   PERTINENT NEGATIVES: No changes in urine, rash/skin changes, peripheral edema, chest pain, shortness of breath, dyspnea.   ASSESSMENT & PLAN:  ## STAGE IIC RECTAL CANCER -  Primary oncologist is Dr. Delton Coombes - Patient is on treatment plan FOLFIRINOX q. 14 days x 8 cycles - C3/D1 of FOLFIRINOX on 02/17/2023 with pump D/C on 02/19/2023 - She received Udenyca on 02/19/2023 - PLAN: Next scheduled visit with medical oncologist (Dr. Delton Coombes) on 03/03/2023 for cycle #4 of chemotherapy  # Diarrhea # (Possible) diverticulitis - Patient has had persistent diarrhea since second cycle of chemotherapy - has been cautiously using Imodium (2 mg after every other watery bowel movement) with goal to maintain soft bowel movement consistency.  She has been avoiding overuse of Imodium, since hard/formed bowel movements place her at increased risk of rectal bleeding. - Currently reports 4 soft bowel movements daily - ED visit on 02/21/2023 with CT A/P showing "sigmoid diverticulosis with mild wall thickening of the sigmoid colon, suggesting mild diverticulitis" - Denies any abdominal pain. - PLAN: Continue amoxicillin as prescribed by ED. - Check C. difficile and GI pathogen panel  - Patient instructed to [place] until infectious GI etiology has been ruled out.  # Nausea and vomiting - Refractory chemo-induced nausea/vomiting despite Compazine and Zofran - PLAN: We will start her on olanzapine 5 mg nightly - Continue Compazine every 6 hours with Zofran 8 mg ODT TID PRN breakthrough nausea/vomiting. - Prescription for Reglan has been discontinued to avoid overmedication and risk of constipation  # Dehydration  # Weight loss and decreased appetite  - Weight today is stable, but she reports very poor oral intake and poor appetite. - She is going to start drinking Boost/Ensure daily. - PLAN: 1 L NS given  in clinic today.  We will recheck labs on Friday, 02/26/2023. - Will start her on Megace appetite stimulant  # Hypokalemia (secondary to the above) # Hypomagnesemia (secondary to the above) - Labs today (02/24/2023) showed magnesium 1.8, potassium 3.1.  Likely secondary to GI  losses. - PLAN: Continue magnesium 400 mg twice daily. - We will INCREASE potassium to 40 mEq twice daily  # Iron deficiency anemia secondary to rectal bleeding - She has intermittent bleeding per rectum from cancer site, denies any rectal bleeding this week - She has required transfusions on a regular basis.   - She has received IV Venofer 400 mg x 3 (most recently on 02/23/2023) - Hgb today (02/24/2023) 7.0, combination of IDA and chemotherapy-induced pancytopenia - PLAN: We will transfuse 1 unit PRBC today. - Continue to closely monitor CBC with blood transfusions as needed - Recheck iron panel in 4 to 6 weeks.  # Diabetic peripheral neuropathy - She has no feeling in her toes.  No worsening since oxaliplatin started, but reports onset of cold sensitivity after second cycle of chemotherapy.   - PLAN: Continue Lyrica twice daily.  # Chemotherapy-induced pancytopenia - CBC today (02/24/2023): Hgb 7.0, platelets 55, WBC 1.8/ANC 1.3/ALC 0.5 - Patient does also have underlying liver cirrhosis, but has previously had normal WBCs and platelets. - She received Udenyca on 02/19/2023 - PLAN: Neutropenic and infection prevention education given to patient.   # Pelvic pain -  Intermittent pelvic pain, usually 3/10 to 5/10 - PLAN: Continue hydrocodone 5 mg to be taken 1 tablet every 4-6 hours as needed for severe pain.  (Patient also takes Flexeril at home, advised that she should NOT take these medications at the same time, and to pay close attention for any symptoms of CNS or respiratory depression)  # Folic acid deficiency - PLAN: Continue folic acid 1 mg tablet daily  PLAN SUMMARY: >> Labs with possible fluid/blood on Friday 02/26/23 >> Next scheduled appointment with main oncologist: 03/03/2023 (cycle #4 chemotherapy)    REVIEW OF SYSTEMS:  Review of Systems  Constitutional:  Positive for activity change, appetite change, fatigue and unexpected weight change. Negative for chills, diaphoresis  and fever.  HENT:  Positive for trouble swallowing (pills). Negative for mouth sores, nosebleeds and sore throat.   Respiratory:  Negative for cough and shortness of breath.   Cardiovascular:  Negative for chest pain, palpitations and leg swelling.  Gastrointestinal:  Positive for diarrhea, nausea and vomiting. Negative for abdominal pain, blood in stool and constipation.  Genitourinary:  Negative for dysuria and hematuria.  Neurological:  Positive for dizziness. Negative for light-headedness, numbness and headaches.  Psychiatric/Behavioral:  Positive for dysphoric mood and sleep disturbance. The patient is nervous/anxious.     Past Medical History, Surgical history, Social history, and Family history were reviewed as documented elsewhere in chart, and were updated as appropriate.   OBJECTIVE:  Physical Exam:  There were no vitals taken for this visit. ECOG: 3  Physical Exam Constitutional:      Appearance: Normal appearance. She is morbidly obese.     Comments: Somewhat weak appearing on exam  Cardiovascular:     Rate and Rhythm: Tachycardia present.     Heart sounds: Murmur heard.  Pulmonary:     Breath sounds: Normal breath sounds.  Skin:    General: Skin is warm and dry.     Coloration: Skin is pale.     Comments: Poor turgor  Neurological:     General: No focal deficit  present.     Mental Status: Mental status is at baseline.  Psychiatric:        Behavior: Behavior normal. Behavior is cooperative.    Lab Review:     Component Value Date/Time   NA 136 02/22/2023 0421   NA 139 09/03/2022 1305   K 3.0 (L) 02/22/2023 0421   CL 104 02/22/2023 0421   CO2 25 02/22/2023 0421   GLUCOSE 136 (H) 02/22/2023 0421   BUN 9 02/22/2023 0421   BUN 8 09/03/2022 1305   CREATININE 0.86 02/22/2023 0421   CREATININE 0.84 01/26/2023 1503   CALCIUM 7.8 (L) 02/22/2023 0421   PROT 5.5 (L) 02/22/2023 0421   PROT 6.6 09/03/2022 1305   ALBUMIN 2.7 (L) 02/22/2023 0421   ALBUMIN 3.7 (L)  09/03/2022 1305   AST 35 02/22/2023 0421   ALT 18 02/22/2023 0421   ALKPHOS 104 02/22/2023 0421   BILITOT 0.8 02/22/2023 0421   BILITOT 0.3 09/03/2022 1305   GFRNONAA >60 02/22/2023 0421   GFRNONAA 78 12/14/2019 0848   GFRAA 81 12/11/2020 0908   GFRAA 90 12/14/2019 0848       Component Value Date/Time   WBC 24.9 (H) 02/22/2023 0421   RBC 2.87 (L) 02/22/2023 0421   HGB 7.9 (L) 02/22/2023 0421   HGB 8.6 (L) 11/26/2022 1343   HCT 24.5 (L) 02/22/2023 0421   HCT 30.5 (L) 11/26/2022 1343   PLT 106 (L) 02/22/2023 0421   PLT 199 11/26/2022 1343   MCV 85.4 02/22/2023 0421   MCV 76 (L) 11/26/2022 1343   MCH 27.5 02/22/2023 0421   MCHC 32.2 02/22/2023 0421   RDW 20.6 (H) 02/22/2023 0421   RDW 16.6 (H) 11/26/2022 1343   LYMPHSABS 2.2 02/21/2023 1339   MONOABS 0.4 02/21/2023 1339   EOSABS 0.0 02/21/2023 1339   BASOSABS 0.0 02/21/2023 1339   -------------------------------  Imaging from last 24 hours (if applicable): Radiology interpretation: CT ABDOMEN PELVIS WO CONTRAST  Result Date: 02/21/2023 CLINICAL DATA:  Emesis, bowel obstruction, rectal cancer, chemotherapy * Tracking Code: BO * EXAM: CT ABDOMEN AND PELVIS WITHOUT CONTRAST TECHNIQUE: Multidetector CT imaging of the abdomen and pelvis was performed following the standard protocol without IV contrast. RADIATION DOSE REDUCTION: This exam was performed according to the departmental dose-optimization program which includes automated exposure control, adjustment of the mA and/or kV according to patient size and/or use of iterative reconstruction technique. COMPARISON:  PET-CT, 12/31/2022 FINDINGS: Lower chest: No acute abnormality. Left coronary artery calcifications. Hepatobiliary: No solid liver abnormality is seen. Coarse contour of the faintly calcified gallstones. No gallbladder wall thickening, or biliary dilatation. Pancreas: Unremarkable. No pancreatic ductal dilatation or surrounding inflammatory changes. Spleen: Normal in size  without significant abnormality. Adrenals/Urinary Tract: Adrenal glands are unremarkable. Unchanged partially exophytic lesion of the posterior midportion of the right kidney measuring 2.9 x 2.6 cm (series 3, image 41). Bladder is unremarkable. Stomach/Bowel: Stomach is within normal limits. Appendix is not clearly visualized. Unchanged left eccentric rectal mass (series 3, image 95). Colon is fluid-filled to the rectum. Sigmoid diverticulosis. Mild wall thickening of the sigmoid colon (series 3, image 82). No evidence of bowel obstruction. Vascular/Lymphatic: Scattered aortic atherosclerosis. No enlarged abdominal or pelvic lymph nodes. Reproductive: Status post hysterectomy. Other: Small, fat containing right inguinal hernia.  No ascites. Musculoskeletal: No acute or significant osseous findings. IMPRESSION: 1. Colon is fluid-filled to the rectum, consistent with diarrheal illness. Sigmoid diverticulosis with mild wall thickening of the sigmoid colon, suggesting mild diverticulitis. 2. No evidence of  bowel obstruction. 3. Unchanged rectal mass. 4. 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 Nonemergent, outpatient MRI may be helpful to more definitively characterize this lesion if desired in the setting of known primary rectal malignancy. 5. Cholelithiasis. 6. Coronary artery disease. Aortic Atherosclerosis (ICD10-I70.0). Electronically Signed   By: Delanna Ahmadi M.D.   On: 02/21/2023 15:07   DG Chest Portable 1 View  Result Date: 02/21/2023 CLINICAL DATA:  Weakness. On active chemotherapy. EXAM: PORTABLE CHEST 1 VIEW COMPARISON:  Radiograph 06/27/2021. CT 12/17/2022 FINDINGS: Right chest port with tip overlying the jugular caval junction. 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  Overall low lung volumes which limits assessment. No obvious focal airspace disease, pleural effusion or pneumothorax. IMPRESSION: Low lung volumes limit assessment. 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 Allowing for limitations, no acute findings Electronically Signed   By: Keith Rake M.D.   On: 02/21/2023 14:25      WRAP UP:  All questions were answered. The patient knows to call the clinic with any problems, questions or concerns.  Medical decision making: High  Time spent on visit: I spent 30 minutes counseling the patient face to face. The total time spent in the appointment was 45 minutes and more than 50% was on counseling.  Harriett Rush, PA-C  02/24/23 12:09 PM

## 2023-02-24 ENCOUNTER — Encounter: Payer: Self-pay | Admitting: Physician Assistant

## 2023-02-24 ENCOUNTER — Inpatient Hospital Stay: Payer: Medicare HMO | Admitting: Physician Assistant

## 2023-02-24 ENCOUNTER — Inpatient Hospital Stay: Payer: Medicare HMO

## 2023-02-24 VITALS — BP 117/66 | HR 86 | Temp 98.0°F | Resp 20 | Wt 265.5 lb

## 2023-02-24 DIAGNOSIS — G893 Neoplasm related pain (acute) (chronic): Secondary | ICD-10-CM

## 2023-02-24 DIAGNOSIS — R630 Anorexia: Secondary | ICD-10-CM

## 2023-02-24 DIAGNOSIS — R197 Diarrhea, unspecified: Secondary | ICD-10-CM | POA: Diagnosis not present

## 2023-02-24 DIAGNOSIS — E876 Hypokalemia: Secondary | ICD-10-CM

## 2023-02-24 DIAGNOSIS — K521 Toxic gastroenteritis and colitis: Secondary | ICD-10-CM

## 2023-02-24 DIAGNOSIS — C20 Malignant neoplasm of rectum: Secondary | ICD-10-CM

## 2023-02-24 DIAGNOSIS — E860 Dehydration: Secondary | ICD-10-CM

## 2023-02-24 DIAGNOSIS — Z5111 Encounter for antineoplastic chemotherapy: Secondary | ICD-10-CM | POA: Diagnosis not present

## 2023-02-24 DIAGNOSIS — T451X5A Adverse effect of antineoplastic and immunosuppressive drugs, initial encounter: Secondary | ICD-10-CM

## 2023-02-24 DIAGNOSIS — R112 Nausea with vomiting, unspecified: Secondary | ICD-10-CM

## 2023-02-24 DIAGNOSIS — D509 Iron deficiency anemia, unspecified: Secondary | ICD-10-CM

## 2023-02-24 DIAGNOSIS — D61810 Antineoplastic chemotherapy induced pancytopenia: Secondary | ICD-10-CM

## 2023-02-24 LAB — CBC WITH DIFFERENTIAL/PLATELET
Abs Immature Granulocytes: 0 10*3/uL (ref 0.00–0.07)
Band Neutrophils: 2 %
Basophils Absolute: 0 10*3/uL (ref 0.0–0.1)
Basophils Relative: 0 %
Eosinophils Absolute: 0 10*3/uL (ref 0.0–0.5)
Eosinophils Relative: 0 %
HCT: 21.7 % — ABNORMAL LOW (ref 36.0–46.0)
Hemoglobin: 7 g/dL — ABNORMAL LOW (ref 12.0–15.0)
Lymphocytes Relative: 25 %
Lymphs Abs: 0.5 10*3/uL — ABNORMAL LOW (ref 0.7–4.0)
MCH: 27.2 pg (ref 26.0–34.0)
MCHC: 32.3 g/dL (ref 30.0–36.0)
MCV: 84.4 fL (ref 80.0–100.0)
Metamyelocytes Relative: 1 %
Monocytes Absolute: 0 10*3/uL — ABNORMAL LOW (ref 0.1–1.0)
Monocytes Relative: 2 %
Neutro Abs: 1.3 10*3/uL — ABNORMAL LOW (ref 1.7–7.7)
Neutrophils Relative %: 70 %
Platelets: 55 10*3/uL — ABNORMAL LOW (ref 150–400)
RBC: 2.57 MIL/uL — ABNORMAL LOW (ref 3.87–5.11)
RDW: 20 % — ABNORMAL HIGH (ref 11.5–15.5)
WBC: 1.8 10*3/uL — ABNORMAL LOW (ref 4.0–10.5)
nRBC: 0 % (ref 0.0–0.2)

## 2023-02-24 LAB — COMPREHENSIVE METABOLIC PANEL
ALT: 17 U/L (ref 0–44)
AST: 29 U/L (ref 15–41)
Albumin: 2.7 g/dL — ABNORMAL LOW (ref 3.5–5.0)
Alkaline Phosphatase: 90 U/L (ref 38–126)
Anion gap: 8 (ref 5–15)
BUN: 7 mg/dL — ABNORMAL LOW (ref 8–23)
CO2: 25 mmol/L (ref 22–32)
Calcium: 7.9 mg/dL — ABNORMAL LOW (ref 8.9–10.3)
Chloride: 103 mmol/L (ref 98–111)
Creatinine, Ser: 0.75 mg/dL (ref 0.44–1.00)
GFR, Estimated: >60 mL/min (ref >60)
Glucose, Bld: 138 mg/dL — ABNORMAL HIGH (ref 70–99)
Potassium: 3.1 mmol/L — ABNORMAL LOW (ref 3.5–5.1)
Sodium: 136 mmol/L (ref 135–145)
Total Bilirubin: 1 mg/dL (ref 0.3–1.2)
Total Protein: 5.5 g/dL — ABNORMAL LOW (ref 6.5–8.1)

## 2023-02-24 LAB — PREPARE RBC (CROSSMATCH)

## 2023-02-24 LAB — MAGNESIUM: Magnesium: 1.8 mg/dL (ref 1.7–2.4)

## 2023-02-24 MED ORDER — SODIUM CHLORIDE 0.9 % IV SOLN: INTRAVENOUS | Status: DC

## 2023-02-24 MED ORDER — MEGESTROL ACETATE 400 MG/10ML PO SUSP
400.0000 mg | Freq: Every day | ORAL | 0 refills | Status: DC
Start: 2023-02-24 — End: 2023-03-22

## 2023-02-24 MED ORDER — HEPARIN SOD (PORK) LOCK FLUSH 100 UNIT/ML IV SOLN
500.0000 [IU] | Freq: Every day | INTRAVENOUS | Status: AC | PRN
  Administered 2023-02-24: 500 [IU]

## 2023-02-24 MED ORDER — POTASSIUM CHLORIDE 40 MEQ/15ML (20%) PO SOLN
15.0000 mL | Freq: Two times a day (BID) | ORAL | 2 refills | Status: DC
Start: 2023-02-24 — End: 2023-04-05

## 2023-02-24 MED ORDER — ONDANSETRON HCL 4 MG/2ML IJ SOLN
8.0000 mg | Freq: Once | INTRAMUSCULAR | Status: AC
  Administered 2023-02-24: 8 mg via INTRAVENOUS
  Filled 2023-02-24: qty 4

## 2023-02-24 MED ORDER — DIPHENHYDRAMINE HCL 25 MG PO CAPS: 25.0000 mg | ORAL_CAPSULE | Freq: Once | ORAL | Status: DC

## 2023-02-24 MED ORDER — SODIUM CHLORIDE 0.9% FLUSH
10.0000 mL | INTRAVENOUS | Status: AC | PRN
  Administered 2023-02-24: 10 mL

## 2023-02-24 MED ORDER — OLANZAPINE 5 MG PO TABS
5.0000 mg | ORAL_TABLET | Freq: Every day | ORAL | 2 refills | Status: DC
Start: 2023-02-24 — End: 2023-04-05

## 2023-02-24 MED ORDER — SODIUM CHLORIDE 0.9% FLUSH
10.0000 mL | Freq: Once | INTRAVENOUS | Status: AC
  Administered 2023-02-24: 10 mL via INTRAVENOUS

## 2023-02-24 MED ORDER — ACETAMINOPHEN 325 MG PO TABS: 650.0000 mg | ORAL_TABLET | Freq: Once | ORAL | Status: DC

## 2023-02-24 MED ORDER — SODIUM CHLORIDE 0.9% IV SOLUTION
250.0000 mL | Freq: Once | INTRAVENOUS | Status: AC
  Administered 2023-02-24: 250 mL via INTRAVENOUS

## 2023-02-24 NOTE — Patient Instructions (Addendum)
[name] at [facility] **   You were seen today by Tarri Abernethy PA-C for your chemotherapy follow-up and symptom management visit.    NAUSEA & VOMITING Take Compazine 10 mg every 6 hours.   Take Zofran (ondansetron) 4 mg orally dissolving tablets.  Take TWO tablets (8 mg total) every 8 hours in between Compazine doses as needed for breakthrough nausea/vomiting. Prescription sent for new medication - Take OLANZAPINE 5 mg every night at bedtime  (Prescription for Reglan has been discontinued to avoid overmedication and constipation)  DIARRHEA Continue antibiotics (amoxicillin) for possible diverticulitis. We will check stool samples to see if you have any bacterial infection in your bowel movements. Until we have finished checking for infection, do NOT take any Imodium.  DEHYDRATION & LOW ELECTROLYTES:  Try to drink at least 60 ounces of water daily. We will check labs and give additional IV fluids if needed on Friday (02/26/2023) Continue magnesium oxide 400 mg twice daily. Take 40 mEq potassium (15 mL liquid) twice daily (instead of once daily)  WEIGHT LOSS Drink Ensure or Boost as tolerated throughout the day. Try to eat small, frequent high-calorie meals. Prescription sent to your pharmacy for Megace (liquid) to be taken once daily as appetite stimulant.  ANEMIA You were given 1 blood transfusion today. If you have any recurrent severe rectal bleeding or have any symptoms of chest pain, shortness of breath, passing out, or altered mental status, you should call 911 and go immediately to the emergency department. You are scheduled for IV iron on Monday, 02/15/2023.  LOW WHITE BLOOD CELLS Your low white blood cells are a side effect of your chemotherapy. Make sure that you are avoiding anyone with obvious illness.  Wash your hands frequently and thoroughly wash any fresh fruits or vegetables.  Avoid any raw or  undercooked meats and eggs. Call our office IMMEDIATELY if you have any fever or other symptoms of infection.  PELVIC PAIN Prescription has been sent to your pharmacy for hydrocodone 5 mg.  You can take 1 tablet every 4-6 hours as needed for severe pain.  Do not take this medication at the same time as your Flexeril and watch for any symptoms of decreased breathing or alertness.  ** If you have any new or worsening symptoms throughout the duration of your cancer treatment, please do not hesitate to call the [facility] at [phone].  Our triage nurse can assist with many common problems, and same-day symptom management visits are available with Tarri Abernethy PA-C as needed.  FOLLOW-UP APPOINTMENT: - Repeat labs with possible IV fluids or blood on Friday  02/26/2023 - Office visit with Dr. Delton Coombes on Wednesday 03/03/2023.  You will also have labs and start cycle #4 of chemotherapy at that time.  ** Thank you for trusting me with your healthcare!  I strive to provide all of my patients with quality care at each visit.  If you receive a survey for this visit, I would be so grateful to you for taking the time to provide feedback.  Thank you in advance!  ~ Jelesa Mangini                   Dr. Derek Jack   &   Tarri Abernethy, PA-C   - - - - - - - - - - - - - - - - - -    Thank you for choosing [place] at [facility] to provide your oncology and hematology care.  To afford each patient quality time with our provider, please arrive at least 15 minutes before your scheduled appointment time.   If you have a lab appointment with the [facility] please come in thru the Main Entrance and check in at the main information desk.  You need to re-schedule your appointment should you arrive 10 or more minutes late.  We strive to give you quality time with our providers, and arriving late affects you and other patients whose appointments are after yours.  Also, if you  no show three or more times for appointments you may be dismissed from the clinic at the providers discretion.     Again, thank you for choosing [facility].  Our hope is that these requests will decrease the amount of time that you wait before being seen by our physicians.       _____________________________________________________________  Should you have questions after your visit to [facility], please contact our office at [phone] and follow the prompts.  Our office hours are 8:00 a.m. and 4:30 p.m. Monday - Friday.  Please note that voicemails left after 4:00 p.m. may not be returned until the following business day.  We are closed weekends and major holidays.  You do have access to a nurse 24-7, just call the main number to the clinic [phone] and do not press any options, hold on the line and a nurse will answer the phone.    For prescription refill requests, have your pharmacy contact our office and allow 72 hours.

## 2023-02-24 NOTE — Patient Instructions (Signed)
[facility]  Discharge Instructions: Thank you for choosing [facility] to provide your oncology and hematology care.  If you have a lab appointment with the [facility] - please note that after April 8th, 2024, all labs will be drawn in the cancer center.  You do not have to check in or register with the main entrance as you have in the past but will complete your check-in in the cancer center.  Wear comfortable clothing and clothing appropriate for easy access to any Portacath or PICC line.   We strive to give you quality time with your provider. You may need to reschedule your appointment if you arrive late (15 or more minutes).  Arriving late affects you and other patients whose appointments are after yours.  Also, if you miss three or more appointments without notifying the office, you may be dismissed from the clinic at the provider's discretion.      For prescription refill requests, have your pharmacy contact our office and allow 72 hours for refills to be completed.    Today you received the following 1unit of PRBCs, 1 L of fluids, 8mg  of IV Zofran, return as scheduled.   To help prevent nausea and vomiting after your treatment, we encourage you to take your nausea medication as directed.  BELOW ARE SYMPTOMS THAT SHOULD BE REPORTED IMMEDIATELY: *FEVER GREATER THAN 100.4 F (38 C) OR HIGHER *CHILLS OR SWEATING *NAUSEA AND VOMITING THAT IS NOT CONTROLLED WITH YOUR NAUSEA MEDICATION *UNUSUAL SHORTNESS OF BREATH *UNUSUAL BRUISING OR BLEEDING *URINARY PROBLEMS (pain or burning when urinating, or frequent urination) *BOWEL PROBLEMS (unusual diarrhea, constipation, pain near the anus) TENDERNESS IN MOUTH AND THROAT WITH OR WITHOUT PRESENCE OF ULCERS (sore throat, sores in mouth, or a toothache) UNUSUAL RASH, SWELLING OR PAIN  UNUSUAL VAGINAL DISCHARGE OR ITCHING   Items with * indicate a potential emergency and should be followed up as soon as  possible or go to the Emergency Department if any problems should occur.  Please show the CHEMOTHERAPY ALERT CARD or IMMUNOTHERAPY ALERT CARD at check-in to the Emergency Department and triage nurse.  Should you have questions after your visit or need to cancel or reschedule your appointment, please contact [facility] [phone]  and follow the prompts.  Office hours are 8:00 a.m. to 4:30 p.m. Monday - Friday. Please note that voicemails left after 4:00 p.m. may not be returned until the following business day.  We are closed weekends and major holidays. You have access to a nurse at all times for urgent questions. Please call the main number to the clinic [phone] and follow the prompts.  For any non-urgent questions, you may also contact your provider using MyChart. We now offer e-Visits for anyone 23 and older to request care online for non-urgent symptoms. For details visit [email or web address].   Also download the MyChart app! Go to the app store, search "MyChart", open the app, select [facility], and log in with your MyChart username and password.

## 2023-02-24 NOTE — Progress Notes (Signed)
Patients port flushed without difficulty.  Good blood return noted with no bruising or swelling noted at site.  stable during access and blood draw.  Patient remains accessed for possible fluids.

## 2023-02-24 NOTE — Progress Notes (Signed)
Patient presents today for possible fluids/blood. Patient reports nausea without vomiting, patient took compazine this morning at 0630, and has not had zofran since last night. Patient reports unable to eat or drink a whole lot. Hemoglobin 7.0, patient reports no signs of bleeding.   Per Tarri Abernethy, PA give 1 unit of blood, 1 L of normal saline over 2 hour and give 8mg  of IV Zofran.  Patient tolerated therapy with no complaints voiced. Side effects with management reviewed with understanding verbalized. Port site clean and dry with no bruising or swelling noted at site. Good blood return noted before and after administration of therapy. Band aid applied. Patient left in satisfactory condition with VSS and no s/s of distress noted.

## 2023-02-25 ENCOUNTER — Other Ambulatory Visit: Payer: Self-pay | Admitting: *Deleted

## 2023-02-25 ENCOUNTER — Other Ambulatory Visit (HOSPITAL_COMMUNITY)
Admission: RE | Admit: 2023-02-25 | Discharge: 2023-02-25 | Disposition: A | Discharge status: Home / Self Care | Payer: Medicare HMO | Source: Ambulatory Visit | Attending: Physician Assistant | Admitting: Physician Assistant

## 2023-02-25 ENCOUNTER — Other Ambulatory Visit: Payer: Self-pay

## 2023-02-25 DIAGNOSIS — R197 Diarrhea, unspecified: Secondary | ICD-10-CM | POA: Diagnosis present

## 2023-02-25 DIAGNOSIS — R112 Nausea with vomiting, unspecified: Secondary | ICD-10-CM

## 2023-02-25 DIAGNOSIS — K6289 Other specified diseases of anus and rectum: Secondary | ICD-10-CM

## 2023-02-25 LAB — C DIFFICILE QUICK SCREEN W PCR REFLEX
C Diff antigen: NEGATIVE
C Diff interpretation: NOT DETECTED
C Diff toxin: NEGATIVE

## 2023-02-26 ENCOUNTER — Inpatient Hospital Stay: Payer: Medicare HMO

## 2023-02-26 ENCOUNTER — Other Ambulatory Visit: Payer: Self-pay

## 2023-02-26 VITALS — BP 123/71 | HR 83 | Temp 98.2°F | Resp 18

## 2023-02-26 VITALS — BP 117/64 | HR 77 | Temp 98.0°F | Resp 18

## 2023-02-26 DIAGNOSIS — E876 Hypokalemia: Secondary | ICD-10-CM

## 2023-02-26 DIAGNOSIS — K6289 Other specified diseases of anus and rectum: Secondary | ICD-10-CM

## 2023-02-26 DIAGNOSIS — R112 Nausea with vomiting, unspecified: Secondary | ICD-10-CM

## 2023-02-26 DIAGNOSIS — Z95828 Presence of other vascular implants and grafts: Secondary | ICD-10-CM

## 2023-02-26 DIAGNOSIS — D509 Iron deficiency anemia, unspecified: Secondary | ICD-10-CM

## 2023-02-26 DIAGNOSIS — Z5111 Encounter for antineoplastic chemotherapy: Secondary | ICD-10-CM | POA: Diagnosis not present

## 2023-02-26 LAB — COMPREHENSIVE METABOLIC PANEL
ALT: 14 U/L (ref 0–44)
AST: 25 U/L (ref 15–41)
Albumin: 2.7 g/dL — ABNORMAL LOW (ref 3.5–5.0)
Alkaline Phosphatase: 76 U/L (ref 38–126)
Anion gap: 8 (ref 5–15)
BUN: 5 mg/dL — ABNORMAL LOW (ref 8–23)
CO2: 28 mmol/L (ref 22–32)
Calcium: 8.2 mg/dL — ABNORMAL LOW (ref 8.9–10.3)
Chloride: 100 mmol/L (ref 98–111)
Creatinine, Ser: 0.72 mg/dL (ref 0.44–1.00)
GFR, Estimated: >60 mL/min (ref >60)
Glucose, Bld: 88 mg/dL (ref 70–99)
Potassium: 2.8 mmol/L — ABNORMAL LOW (ref 3.5–5.1)
Sodium: 136 mmol/L (ref 135–145)
Total Bilirubin: 0.9 mg/dL (ref 0.3–1.2)
Total Protein: 5.6 g/dL — ABNORMAL LOW (ref 6.5–8.1)

## 2023-02-26 LAB — CBC WITH DIFFERENTIAL/PLATELET
Abs Immature Granulocytes: 0 10*3/uL (ref 0.00–0.07)
Band Neutrophils: 3 %
Basophils Absolute: 0 10*3/uL (ref 0.0–0.1)
Basophils Relative: 1 %
Eosinophils Absolute: 0 10*3/uL (ref 0.0–0.5)
Eosinophils Relative: 1 %
HCT: 22.5 % — ABNORMAL LOW (ref 36.0–46.0)
Hemoglobin: 7.4 g/dL — ABNORMAL LOW (ref 12.0–15.0)
Lymphocytes Relative: 30 %
Lymphs Abs: 0.5 10*3/uL — ABNORMAL LOW (ref 0.7–4.0)
MCH: 27.4 pg (ref 26.0–34.0)
MCHC: 32.9 g/dL (ref 30.0–36.0)
MCV: 83.3 fL (ref 80.0–100.0)
Metamyelocytes Relative: 1 %
Monocytes Absolute: 0.1 10*3/uL (ref 0.1–1.0)
Monocytes Relative: 7 %
Neutro Abs: 0.9 10*3/uL — ABNORMAL LOW (ref 1.7–7.7)
Neutrophils Relative %: 57 %
Platelets: 56 10*3/uL — ABNORMAL LOW (ref 150–400)
RBC: 2.7 MIL/uL — ABNORMAL LOW (ref 3.87–5.11)
RDW: 18.9 % — ABNORMAL HIGH (ref 11.5–15.5)
WBC: 1.5 10*3/uL — ABNORMAL LOW (ref 4.0–10.5)
nRBC: 0 % (ref 0.0–0.2)

## 2023-02-26 LAB — CULTURE, BLOOD (ROUTINE X 2)
Culture: NO GROWTH
Culture: NO GROWTH
Special Requests: ADEQUATE

## 2023-02-26 LAB — GI PATHOGEN PANEL BY PCR, STOOL

## 2023-02-26 LAB — PREPARE RBC (CROSSMATCH)

## 2023-02-26 LAB — MAGNESIUM: Magnesium: 1.6 mg/dL — ABNORMAL LOW (ref 1.7–2.4)

## 2023-02-26 MED ORDER — POTASSIUM CHLORIDE IN NACL 40-0.9 MEQ/L-% IV SOLN
Freq: Once | INTRAVENOUS | Status: AC
  Filled 2023-02-26: qty 1000

## 2023-02-26 MED ORDER — DIPHENHYDRAMINE HCL 25 MG PO CAPS: 25.0000 mg | ORAL_CAPSULE | Freq: Once | ORAL | Status: DC

## 2023-02-26 MED ORDER — SODIUM CHLORIDE 0.9% IV SOLUTION
250.0000 mL | Freq: Once | INTRAVENOUS | Status: AC
  Administered 2023-02-26: 250 mL via INTRAVENOUS

## 2023-02-26 MED ORDER — ACETAMINOPHEN 325 MG PO TABS: 650.0000 mg | ORAL_TABLET | Freq: Once | ORAL | Status: DC

## 2023-02-26 MED ORDER — SODIUM CHLORIDE 0.9% FLUSH
10.0000 mL | INTRAVENOUS | Status: DC | PRN
  Administered 2023-02-26: 10 mL via INTRAVENOUS

## 2023-02-26 MED ORDER — MAGNESIUM SULFATE 2 GM/50ML IV SOLN
2.0000 g | Freq: Once | INTRAVENOUS | Status: AC
  Administered 2023-02-26: 2 g via INTRAVENOUS
  Filled 2023-02-26: qty 50

## 2023-02-26 MED ORDER — HEPARIN SOD (PORK) LOCK FLUSH 100 UNIT/ML IV SOLN
500.0000 [IU] | Freq: Once | INTRAVENOUS | Status: AC
  Administered 2023-02-26: 500 [IU] via INTRAVENOUS

## 2023-02-26 NOTE — Progress Notes (Signed)
Patients port flushed without difficulty.  Good blood return noted with no bruising or swelling noted at site.  Patient remains accessed for possible treatment.  

## 2023-02-26 NOTE — Progress Notes (Unsigned)
Patient presents today for house fluids and blood transfusion. Patient is in satisfactory condition with no new complaints voiced.  Vital signs are stable.  Labs reviewed Mag 1.6, Potassium 1.6, Hgb 7.4. Magnesium 2 grams IV, NS with 40 MEQ potassium, and 1 Unit of PRBC to be given. We will proceed with infusion per provider orders.    Patient tolerated treatment well with no complaints voiced.  Patient left in wheelchair in stable condition.  Vital signs stable at discharge.  Follow up as scheduled.

## 2023-02-26 NOTE — Patient Instructions (Signed)
[facility]  Discharge Instructions: Thank you for choosing [facility] to provide your oncology and hematology care.  If you have a lab appointment with the Cancer Center - please note that after April 8th, 2024, all labs will be drawn in the cancer center.  You do not have to check in or register with the main entrance as you have in the past but will complete your check-in in the cancer center.  Wear comfortable clothing and clothing appropriate for easy access to any Portacath or PICC line.   We strive to give you quality time with your provider. You may need to reschedule your appointment if you arrive late (15 or more minutes).  Arriving late affects you and other patients whose appointments are after yours.  Also, if you miss three or more appointments without notifying the office, you may be dismissed from the clinic at the provider's discretion.      For prescription refill requests, have your pharmacy contact our office and allow 72 hours for refills to be completed.    Today you received the following house fluids and 1 Unit PRBC.      To help prevent nausea and vomiting after your treatment, we encourage you to take your nausea medication as directed.  BELOW ARE SYMPTOMS THAT SHOULD BE REPORTED IMMEDIATELY: *FEVER GREATER THAN 100.4 F (38 C) OR HIGHER *CHILLS OR SWEATING *NAUSEA AND VOMITING THAT IS NOT CONTROLLED WITH YOUR NAUSEA MEDICATION *UNUSUAL SHORTNESS OF BREATH *UNUSUAL BRUISING OR BLEEDING *URINARY PROBLEMS (pain or burning when urinating, or frequent urination) *BOWEL PROBLEMS (unusual diarrhea, constipation, pain near the anus) TENDERNESS IN MOUTH AND THROAT WITH OR WITHOUT PRESENCE OF ULCERS (sore throat, sores in mouth, or a toothache) UNUSUAL RASH, SWELLING OR PAIN  UNUSUAL VAGINAL DISCHARGE OR ITCHING   Items with * indicate a potential emergency and should be followed up as soon as possible or go to the Emergency Department  if any problems should occur.  Please show the CHEMOTHERAPY ALERT CARD or IMMUNOTHERAPY ALERT CARD at check-in to the Emergency Department and triage nurse.  Should you have questions after your visit or need to cancel or reschedule your appointment, please contact [facility]ENTER AT [facility] [phone]  and follow the prompts.  Office hours are 8:00 a.m. to 4:30 p.m. Monday - Friday. Please note that voicemails left after 4:00 p.m. may not be returned until the following business day.  We are closed weekends and major holidays. You have access to a nurse at all times for urgent questions. Please call the main number to the clinic [phone] and follow the prompts.  For any non-urgent questions, you may also contact your provider using MyChart. We now offer e-Visits for anyone 72 and older to request care online for non-urgent symptoms. For details visit [email or web address].   Also download the MyChart app! Go to the app store, search "MyChart", open the app, select [place], and log in with your MyChart username and password.

## 2023-02-26 NOTE — Progress Notes (Signed)
Chaplain engaged in an initial visit with [name]." Chaplain offered support and reflective listening as she shared her healthcare journey. She expressed that things had gotten so bad that she wanted to die but because of improvements in her health and body she has a positive outlook on life now. She expressed that she feels much better. Chaplain offered prayer over [facility] at her request.   [name], MDiv  02/26/23 1100  Spiritual Encounters  Type of Visit Initial  Care provided to: Pt and family  Reason for visit Routine spiritual support  Interventions  Spiritual Care Interventions Made Prayer;Established relationship of care and support;Compassionate presence;Reflective listening

## 2023-02-28 LAB — TYPE AND SCREEN
ABO/RH(D): A POS
Antibody Screen: NEGATIVE
Unit division: 0
Unit division: 0

## 2023-02-28 LAB — BPAM RBC
Blood Product Expiration Date: 202404192359
Blood Product Expiration Date: 202404212359
ISSUE DATE / TIME: 202404031032
ISSUE DATE / TIME: 202404050945
Unit Type and Rh: 6200
Unit Type and Rh: 6200

## 2023-03-01 ENCOUNTER — Inpatient Hospital Stay: Payer: Medicare HMO

## 2023-03-01 ENCOUNTER — Encounter: Payer: Self-pay | Admitting: Hematology

## 2023-03-01 VITALS — BP 122/89 | HR 94 | Resp 18

## 2023-03-01 DIAGNOSIS — K6289 Other specified diseases of anus and rectum: Secondary | ICD-10-CM

## 2023-03-01 DIAGNOSIS — C20 Malignant neoplasm of rectum: Secondary | ICD-10-CM

## 2023-03-01 DIAGNOSIS — E876 Hypokalemia: Secondary | ICD-10-CM

## 2023-03-01 DIAGNOSIS — Z5111 Encounter for antineoplastic chemotherapy: Secondary | ICD-10-CM | POA: Diagnosis not present

## 2023-03-01 DIAGNOSIS — D509 Iron deficiency anemia, unspecified: Secondary | ICD-10-CM

## 2023-03-01 LAB — CBC WITH DIFFERENTIAL/PLATELET
Abs Immature Granulocytes: 0.1 10*3/uL — ABNORMAL HIGH (ref 0.00–0.07)
Band Neutrophils: 3 %
Basophils Absolute: 0 10*3/uL (ref 0.0–0.1)
Basophils Relative: 0 %
Eosinophils Absolute: 0.1 10*3/uL (ref 0.0–0.5)
Eosinophils Relative: 4 %
HCT: 29.4 % — ABNORMAL LOW (ref 36.0–46.0)
Hemoglobin: 9.6 g/dL — ABNORMAL LOW (ref 12.0–15.0)
Lymphocytes Relative: 53 %
Lymphs Abs: 1.5 10*3/uL (ref 0.7–4.0)
MCH: 27.8 pg (ref 26.0–34.0)
MCHC: 32.7 g/dL (ref 30.0–36.0)
MCV: 85.2 fL (ref 80.0–100.0)
Metamyelocytes Relative: 3 %
Monocytes Absolute: 0.3 10*3/uL (ref 0.1–1.0)
Monocytes Relative: 10 %
Myelocytes: 2 %
Neutro Abs: 0.8 10*3/uL — ABNORMAL LOW (ref 1.7–7.7)
Neutrophils Relative %: 25 %
Platelets: 120 10*3/uL — ABNORMAL LOW (ref 150–400)
RBC: 3.45 MIL/uL — ABNORMAL LOW (ref 3.87–5.11)
RDW: 19.3 % — ABNORMAL HIGH (ref 11.5–15.5)
WBC: 2.8 10*3/uL — ABNORMAL LOW (ref 4.0–10.5)
nRBC: 3.5 % — ABNORMAL HIGH (ref 0.0–0.2)

## 2023-03-01 LAB — COMPREHENSIVE METABOLIC PANEL
ALT: 13 U/L (ref 0–44)
AST: 22 U/L (ref 15–41)
Albumin: 2.8 g/dL — ABNORMAL LOW (ref 3.5–5.0)
Alkaline Phosphatase: 79 U/L (ref 38–126)
Anion gap: 10 (ref 5–15)
BUN: <5 mg/dL — ABNORMAL LOW (ref 8–23)
CO2: 27 mmol/L (ref 22–32)
Calcium: 8.2 mg/dL — ABNORMAL LOW (ref 8.9–10.3)
Chloride: 101 mmol/L (ref 98–111)
Creatinine, Ser: 0.91 mg/dL (ref 0.44–1.00)
GFR, Estimated: >60 mL/min (ref >60)
Glucose, Bld: 128 mg/dL — ABNORMAL HIGH (ref 70–99)
Potassium: 2.8 mmol/L — ABNORMAL LOW (ref 3.5–5.1)
Sodium: 138 mmol/L (ref 135–145)
Total Bilirubin: 0.8 mg/dL (ref 0.3–1.2)
Total Protein: 5.7 g/dL — ABNORMAL LOW (ref 6.5–8.1)

## 2023-03-01 LAB — MAGNESIUM: Magnesium: 1.6 mg/dL — ABNORMAL LOW (ref 1.7–2.4)

## 2023-03-01 LAB — SAMPLE TO BLOOD BANK

## 2023-03-01 MED ORDER — SODIUM CHLORIDE 0.9% FLUSH
10.0000 mL | Freq: Once | INTRAVENOUS | Status: AC | PRN
  Administered 2023-03-01: 10 mL

## 2023-03-01 MED ORDER — POTASSIUM CHLORIDE IN NACL 40-0.9 MEQ/L-% IV SOLN
Freq: Once | INTRAVENOUS | Status: AC
  Filled 2023-03-01: qty 1000

## 2023-03-01 MED ORDER — HEPARIN SOD (PORK) LOCK FLUSH 100 UNIT/ML IV SOLN
500.0000 [IU] | Freq: Once | INTRAVENOUS | Status: AC | PRN
  Administered 2023-03-01: 500 [IU]

## 2023-03-01 MED ORDER — FILGRASTIM-SNDZ 480 MCG/0.8ML IJ SOSY
480.0000 ug | PREFILLED_SYRINGE | Freq: Once | INTRAMUSCULAR | Status: AC
  Administered 2023-03-01: 480 ug via SUBCUTANEOUS
  Filled 2023-03-01: qty 0.8

## 2023-03-01 MED ORDER — MAGNESIUM SULFATE 2 GM/50ML IV SOLN
2.0000 g | Freq: Once | INTRAVENOUS | Status: AC
  Administered 2023-03-01: 2 g via INTRAVENOUS
  Filled 2023-03-01: qty 50

## 2023-03-01 MED ORDER — SODIUM CHLORIDE 0.9% FLUSH
10.0000 mL | Freq: Once | INTRAVENOUS | Status: AC
  Administered 2023-03-01: 10 mL via INTRAVENOUS

## 2023-03-01 NOTE — Progress Notes (Signed)
Patients port flushed without difficulty.  Good blood return noted with no bruising or swelling noted at site.  Stable during access and blood draw.  Patient to remain accessed for possible treatment. 

## 2023-03-01 NOTE — Progress Notes (Signed)
Patient presents today for possible fluids/blood, potassium 2.8, Mag 1.6. Rojelio Brenner PA made aware, received verbal orders for in 1 L of normal saline and 2g of magnesium. Patient encouraged to increase fluid intake at home.   ANC 0.8, Rojelio Brenner, PA made aware. Received orders for of Zarxio. Patient tolerated injection with no complaints voiced. Site clean and dry with no bruising or swelling noted at site. See MAR for details. Band aid applied.  Patient stable during and after injection.   Patient tolerated potassium & magnesium therapy with no complaints voiced. Side effects with management reviewed with understanding verbalized. Port site clean and dry with no bruising or swelling noted at site. Good blood return noted before and after administration of therapy. Band aid applied. Patient left in satisfactory condition with VSS and no s/s of distress noted.

## 2023-03-01 NOTE — Patient Instructions (Signed)
[facility]  Discharge Instructions: Thank you for choosing [facility] to provide your oncology and hematology care.  If you have a lab appointment with the Cancer Center - please note that after April 8th, 2024, all labs will be drawn in the cancer center.  You do not have to check in or register with the main entrance as you have in the past but will complete your check-in in the cancer center.  Wear comfortable clothing and clothing appropriate for easy access to any Portacath or PICC line.   We strive to give you quality time with your provider. You may need to reschedule your appointment if you arrive late (15 or more minutes).  Arriving late affects you and other patients whose appointments are after yours.  Also, if you miss three or more appointments without notifying the office, you may be dismissed from the clinic at the provider's discretion.      For prescription refill requests, have your pharmacy contact our office and allow 72 hours for refills to be completed.    Today you received the following 40 mEq of IV potassium, 2g of IV magnesium, and Zarxio injection, return as scheduled.   To help prevent nausea and vomiting after your treatment, we encourage you to take your nausea medication as directed.  BELOW ARE SYMPTOMS THAT SHOULD BE REPORTED IMMEDIATELY: *FEVER GREATER THAN 100.4 F (38 C) OR HIGHER *CHILLS OR SWEATING *NAUSEA AND VOMITING THAT IS NOT CONTROLLED WITH YOUR NAUSEA MEDICATION *UNUSUAL SHORTNESS OF BREATH *UNUSUAL BRUISING OR BLEEDING *URINARY PROBLEMS (pain or burning when urinating, or frequent urination) *BOWEL PROBLEMS (unusual diarrhea, constipation, pain near the anus) TENDERNESS IN MOUTH AND THROAT WITH OR WITHOUT PRESENCE OF ULCERS (sore throat, sores in mouth, or a toothache) UNUSUAL RASH, SWELLING OR PAIN  UNUSUAL VAGINAL DISCHARGE OR ITCHING   Items with * indicate a potential emergency and should be followed up  as soon as possible or go to the Emergency Department if any problems should occur.  Please show the CHEMOTHERAPY ALERT CARD or IMMUNOTHERAPY ALERT CARD at check-in to the Emergency Department and triage nurse.  Should you have questions after your visit or need to cancel or reschedule your appointment, please contact [facility]ENTER AT [facility] [phone]  and follow the prompts.  Office hours are 8:00 a.m. to 4:30 p.m. Monday - Friday. Please note that voicemails left after 4:00 p.m. may not be returned until the following business day.  We are closed weekends and major holidays. You have access to a nurse at all times for urgent questions. Please call the main number to the clinic [phone] and follow the prompts.  For any non-urgent questions, you may also contact your provider using MyChart. We now offer e-Visits for anyone 10 and older to request care online for non-urgent symptoms. For details visit [email or web address].   Also download the MyChart app! Go to the app store, search "MyChart", open the app, select [place], and log in with your MyChart username and password.

## 2023-03-02 ENCOUNTER — Encounter: Payer: Self-pay | Admitting: Physician Assistant

## 2023-03-02 ENCOUNTER — Other Ambulatory Visit: Payer: Self-pay

## 2023-03-02 NOTE — Progress Notes (Signed)
Cancer Center nutritionist noted that patient's [name] may be worsening her symptoms of decreased appetite, weight loss, and nausea while undergoing chemotherapy treatment.  Message sent to patient's endocrinology team Ronny Bacon, NP and Dr. Fransico Him), and they will schedule patient for endocrinology office visit to discuss alternative options to [facility] that may work better while she is receiving cancer treatment.

## 2023-03-02 NOTE — Progress Notes (Signed)
[facility] [address] [place] [address]    Clinic Day:  03/02/2023  Referring physician: Gwenlyn Found, MD  Patient Care Team: Carrie Found, MD as PCP - General (Family Medicine) Carrie Sidle, MD as PCP - Cardiology (Cardiology) Carrie Gauss Gerrit Friends, MD as Consulting Physician (Gastroenterology) Carrie Massed, MD as Medical Oncologist (Medical Oncology) Carrie Sarah, RN as Oncology Nurse Navigator (Medical Oncology)   ASSESSMENT & PLAN:   Assessment: 1.  Stage IIc (T3d/4b N0 M0) rectal cancer: - Rectal bleeding for the past 4 to 5 years. - Colonoscopy (12/16/2022): Tumor protruding through the anal orifice.  Bulky semilunar lateral rectal tumor extending from the anorectal junction proximally about 13 cm. 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  MSI-high not detected by [id number] - CT CAP (12/17/2022): Locally advanced low rectal primary without bowel obstruction or nodal metastasis.  Isolated right middle lobe subpleural lung nodule most likely benign.  Cirrhosis and portal venous hypertension.  2.6 cm right renal lesion with differential hemorrhagic/proteinaceous cyst or solid neoplasm. - PET scan (12/31/2022): Markedly hypermetabolic rectal lesion with no evidence for perirectal or pelvic lymphadenopathy.  No metastatic disease in the neck, chest, abdomen or pelvis.  Focal hypermetabolism in the region of the gallbladder neck corresponds to subtle 13 mm focus of increased attenuation in the lumen of the gallbladder.  Differential includes gallbladder polyp/neoplasm.  2.7 cm exophytic posterior right interpolar renal lesion shows no substantial hypermetabolism, likely cyst.  2.3 cm inferior right thyroid nodule without hypermetabolism. - MRI pelvis (12/24/2022): Extension through muscularis propria.  Tumor size 3.8 x 3 x 5.8 cm with a volume 35 cm.  T stage is T3d, probable T4.  Suspicion  of involvement of pelvic floor musculature and the far posterior and left side of the vagina.   2.  Social/family history: - She lives at home with her family.  She is accompanied by her daughter today.  She is independent of ADLs and IADLs.  She is a retired Production designer, theatre/television/film at [employer].  Non-smoker.  She reports having hysterectomy in 1986 for cancerous polyps in her endometrium. - Maternal grandmother's sister had colon cancer.   Plan: 1.  Stage IIc (T3d/4BN0M0) rectal cancer, MSI stable: - She has tolerated cycle 2 reasonably well. - She had some nausea and vomiting and forgot to take Compazine. - I have told her to take Compazine in the mornings daily. - Reviewed labs today which showed normal LFTs.  CBC was grossly normal.  She will proceed with cycle 3 with dose reduction.  She will be managed in our symptom management clinic in 1 week.  RTC 2 weeks for follow-up with me.   2.  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  Continue Lyrica twice daily.  No worsening since oxaliplatin started.   3.  Severe microcytic anemia: - She received Venofer 400 mg on 02/08/2023 and 02/15/2023.  Hemoglobin is 8.9.  Closely monitor CBC.  She denies any bleeding per rectum in the last 2 weeks.   4.  Hypokalemia: - Continue potassium 40 mEq liquid from twice daily.   5.  Folic acid deficiency: - Continue folic acid 1 mg tablet daily.  No orders of the defined types were placed in this encounter.     I,[name],acting as a Neurosurgeon for Carrie Massed, MD.,have documented all relevant documentation on the behalf of Carrie Massed, MD,as directed by  Carrie Massed, MD while in the presence of Carrie Massed, MD.   ***  [name]   4/9/20248:20 PM  CHIEF COMPLAINT:   Diagnosis: stage IIc (T4b N0) low rectal cancer   Cancer Staging  Rectal cancer Staging form: Colon and Rectum, AJCC 8th Edition - Clinical stage from  01/03/2023: Stage IIC (cT4b, cN0, cM0) - Unsigned    Prior Therapy: none  Current Therapy:  FOLFIRINOX    HISTORY OF PRESENT ILLNESS:   Oncology History  Rectal cancer  01/03/2023 Initial Diagnosis   Rectal cancer (HCC)   01/20/2023 -  Chemotherapy   Patient is on Treatment Plan : RECTAL Modified FOLFIRINOX q14d x 8 cycles        INTERVAL HISTORY:   [name] is a 67 y.o. female presenting to clinic today for follow up of stage IIc (T4b N0) low rectal cancer. She was last seen by me on 02/17/23.  Since I last saw her, she had ED visits on 02/21/2023 and 02/22/2023 for nausea and vomiting. She was seen by Porfirio Mylar in our symptom management clinic on 02/24/23 for further management.  Today, she states that she is doing well overall. Her appetite level is at ***%. Her energy level is at ***%.  PAST MEDICAL HISTORY:   Past Medical History: Past Medical History:  Diagnosis Date   Anxiety    Cancer    Coronary atherosclerosis    Cardiac CT 09/2018 with calcium score 8.7 and mild proximal LAD disease   Essential hypertension 2009   GERD (gastroesophageal reflux disease)    Hypothyroidism    Iron deficiency anemia    Osteoarthritis    Palpitations    Tachypalpitations on beta blockers since 2010   Type 2 diabetes mellitus 2003   Vitamin B 12 deficiency    Vitamin D deficiency     Surgical History: Past Surgical History:  Procedure Laterality Date   ABDOMINAL HYSTERECTOMY     History of cervical cancer   BIOPSY  12/16/2022   Procedure: BIOPSY;  Surgeon: Corbin Ade, MD;  [facility];  Service: Endoscopy;;  gastric, rectal   BREAST BIOPSY Right    COLONOSCOPY WITH PROPOFOL N/A 12/16/2022   Procedure: COLONOSCOPY WITH PROPOFOL;  Surgeon: Corbin Ade, MD;  [facility]: [facility];  Service: Endoscopy;  Laterality: N/A;  10:45 am   ESOPHAGOGASTRODUODENOSCOPY (EGD) WITH PROPOFOL N/A 12/16/2022   Procedure: ESOPHAGOGASTRODUODENOSCOPY (EGD) WITH PROPOFOL;  Surgeon:  Corbin Ade, MD;  [facility]: [facility];  Service: Endoscopy;  Laterality: N/A;   IR IMAGING GUIDED PORT INSERTION  01/15/2023   MALONEY DILATION N/A 12/16/2022   Procedure: Elease Hashimoto DILATION;  Surgeon: Corbin Ade, MD;  [facility];  Service: Endoscopy;  Laterality: N/A;   POLYPECTOMY  12/16/2022   Procedure: POLYPECTOMY;  Surgeon: Corbin Ade, MD;  [facility];  Service: Endoscopy;;   TONSILLECTOMY      Social History: Social History   Socioeconomic History   Marital status: Single    Spouse name: Not on file   Number of children: 2   Years of education: Not on file   Highest education level: Not on file  Occupational History   Occupation: DISABLED    Employer: UNEMPLOYED    Comment: OSTEOAR[employer]RIT[employer]  Tobacco Use   Smoking status: Never   Smokeless tobacco: Never  Vaping Use   Vaping Use: Never used  Substance and Sexual Activity   Alcohol use: No   Drug use: No  Sexual activity: Never  Other Topics Concern   Not on file  Social History Narrative   Has 2 grandchildren. 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   Social Determinants of Health   Financial Resource Strain: Not on file  Food Insecurity: No Food Insecurity (01/06/2023)   Hunger Vital Sign    Worried About Running Out of Food in the Last Year: Never true    Ran Out of Food in the Last Year: Never true  Transportation Needs: No Transportation Needs (01/06/2023)   PRAPARE - Administrator, Civil Service (Medical): No    Lack of Transportation (Non-Medical): No  Physical Activity: Not on file  Stress: Not on file  Social Connections: Not on file  Intimate Partner Violence: Not At Risk (01/06/2023)   Humiliation, Afraid, Rape, and Kick questionnaire    Fear of Current or Ex-Partner: No    Emotionally Abused: No    Physically Abused: No    Sexually Abused: No    Family History: Family History  Problem Relation Age of Onset   Heart failure  Mother    Diabetes Father    Hypertension Father    Stroke Sister 50   Diabetes Brother    Cancer - Colon Other        age 55   Colon polyps Neg Hx     Current Medications:  Current Outpatient Medications:    amoxicillin-clavulanate (AUGMENTIN) 875-125 MG tablet, Take 1 tablet by mouth every 12 (twelve) hours., Disp: 14 tablet, Rfl: 0   blood glucose meter kit and supplies KIT, Dispense based on patient and insurance preference. Use up to four times daily as directed. (FOR ICD-10 E11.65) Number of Strips 400 Number of Lancets 400, Disp: 1 each, Rfl: 0   Cholecalciferol (VITAMIN D3) 5000 units TABS, Take 5,000 Units by mouth daily., Disp: , Rfl:    cyanocobalamin (VITAMIN B12) 1000 MCG/ML injection, B12 shots at a frequency of once weekly for 4 weeks then can drop down to once monthly thereafter., Disp: , Rfl:    cyclobenzaprine (FLEXERIL) 10 MG tablet, Take 10 mg by mouth 3 (three) times daily as needed for muscle spasms., Disp: , Rfl:    dextrose 5 % SOLN 1,000 mL with fluorouracil 5 GM/100ML SOLN, Inject into the vein over 48 hr. Every 14 days, Disp: , Rfl:    docusate sodium (COLACE) 100 MG capsule, Take 1 capsule (100 mg total) by mouth 2 (two) times daily. Do NOT take this if you have diarrhea.  Try to maintain soft easy to pass bowel movements., Disp: 10 capsule, Rfl: 0   DROPLET PEN NEEDLES 31G X 5 MM MISC, USE AS INSTRUCTED TO INJECT INSULIN DAILY, Disp: 100 each, Rfl: 3   FLUOROURACIL IV, Inject into the vein every 14 (fourteen) days., Disp: , Rfl:    FLUoxetine (PROZAC) 20 MG capsule, Take 20 mg by mouth daily., Disp: , Rfl:    folic acid (FOLVITE) 1 MG tablet, Take 1 tablet (1 mg total) by mouth daily., Disp: 30 tablet, Rfl: 6   HYDROcodone-acetaminophen (NORCO) 5-325 MG tablet, Take 1 tablet by mouth every 6 (six) hours as needed for severe pain., Disp: 30 tablet, Rfl: 0   insulin glargine (LANTUS SOLOSTAR) 100 UNIT/ML Solostar Pen, Inject 60 Units into the skin at bedtime.,  Disp: 30 mL, Rfl: 0   IRINOTECAN HCL IV, Inject into the vein every 14 (fourteen) days., Disp: , Rfl:    LEUCOVORIN CALCIUM IV, Inject  into the vein every 14 (fourteen) days., Disp: , Rfl:    levothyroxine (SYNTHROID) 88 MCG tablet, TAKE 1 TABLET EVERY DAY BEFORE BREAKFAST, Disp: 90 tablet, Rfl: 1   lidocaine-prilocaine (EMLA) cream, Apply to affected area once, Disp: 30 g, Rfl: 3   loperamide (IMODIUM) 2 MG capsule, Take 1 capsule (2 mg total) by mouth as needed for diarrhea or loose stools., Disp: 30 capsule, Rfl: 0   magnesium oxide (MAG-OX) 400 (240 Mg) MG tablet, Take 1 tablet (400 mg total) by mouth 2 (two) times daily., Disp: 60 tablet, Rfl: 2   megestrol (MEGACE) 400 MG/10ML suspension, Take 10 mLs (400 mg total) by mouth daily., Disp: 240 mL, Rfl: 0   OLANZapine (ZYPREXA) 5 MG tablet, Take 1 tablet (5 mg total) by mouth at bedtime., Disp: 30 tablet, Rfl: 2   ondansetron (ZOFRAN-ODT) 4 MG disintegrating tablet, Take 2 tablets (8 mg total) by mouth every 8 (eight) hours as needed for refractory nausea / vomiting. Take in between doses of Compazine to maximize symptom relief., Disp: 60 tablet, Rfl: 1   OXALIPLATIN IV, Inject into the vein every 14 (fourteen) days., Disp: , Rfl:    pantoprazole (PROTONIX) 40 MG tablet, Take 1 tablet (40 mg total) by mouth 2 (two) times daily before a meal., Disp: 60 tablet, Rfl: 3   Potassium Chloride 40 MEQ/15ML (20%) SOLN, Take 15 mLs by mouth in the morning and at bedtime., Disp: 900 mL, Rfl: 2   pregabalin (LYRICA) 100 MG capsule, Take 1 capsule (100 mg total) by mouth 2 (two) times daily., Disp: 180 capsule, Rfl: 1   prochlorperazine (COMPAZINE) 10 MG tablet, Take 1 tablet (10 mg total) by mouth every 6 (six) hours as needed for nausea or vomiting., Disp: 30 tablet, Rfl: 3   sucralfate (CARAFATE) 1 GM/10ML suspension, Take 10 mLs (1 g total) by mouth 4 (four) times daily. Swish and swallow, Disp: 420 mL, Rfl: 2   tirzepatide (MOUNJARO) 10 MG/0.5ML Pen,  INJECT 10MG  (1 PEN) UNDER THE SKIN ONE TIME WEEKLY, Disp: 2 mL, Rfl: 2   TRUE METRIX BLOOD GLUCOSE TEST test strip, TEST BLOOD SUGAR TWICE DAILY, Disp: 200 strip, Rfl: 3 No current facility-administered medications for this visit.  Facility-Administered Medications Ordered in Other Visits:    0.9 %  sodium chloride infusion, , Intravenous, Continuous, Carrie Massed, MD, Stopped at 02/17/23 1535   Allergies: Allergies  Allergen Reactions   Contrast Media [Iodinated Contrast Media] Other (See Comments)    Burning   Sulfa Antibiotics Nausea And Vomiting    Other reaction(s): GI Upset (intolerance) unknown   Doxepin Rash and Swelling   Tape Rash    REVIEW OF SYSTEMS:   Review of Systems  Constitutional:  Negative for chills, fatigue and fever.  HENT:   Negative for lump/mass, mouth sores, nosebleeds, sore throat and trouble swallowing.   Eyes:  Negative for eye problems.  Respiratory:  Negative for cough and shortness of breath.   Cardiovascular:  Negative for chest pain, leg swelling and palpitations.  Gastrointestinal:  Negative for abdominal pain, constipation, diarrhea, nausea and vomiting.  Genitourinary:  Negative for bladder incontinence, difficulty urinating, dysuria, frequency, hematuria and nocturia.   Musculoskeletal:  Negative for arthralgias, back pain, flank pain, myalgias and neck pain.  Skin:  Negative for itching and rash.  Neurological:  Negative for dizziness, headaches and numbness.  Hematological:  Does not bruise/bleed easily.  Psychiatric/Behavioral:  Negative for depression, sleep disturbance and suicidal ideas. The patient is not nervous/anxious.  All other systems reviewed and are negative.    VITALS:   There were no vitals taken for this visit.  Wt Readings from Last 3 Encounters:  02/24/23 265 lb 8 oz (120.4 kg)  02/22/23 264 lb (119.7 kg)  02/21/23 264 lb (119.7 kg)    There is no height or weight on file to calculate BMI.  Performance  status (ECOG): {CHL ONC [id number]  PHYSICAL EXAM:   Physical Exam Vitals and nursing note reviewed. Exam conducted with a chaperone present.  Constitutional:      Appearance: Normal appearance.  Cardiovascular:     Rate and Rhythm: Normal rate and regular rhythm.     Pulses: Normal pulses.     Heart sounds: Normal heart sounds.  Pulmonary:     Effort: Pulmonary effort is normal.     Breath sounds: Normal breath sounds.  Abdominal:     Palpations: Abdomen is soft. There is no hepatomegaly, splenomegaly or mass.     Tenderness: There is no abdominal tenderness.  Musculoskeletal:     Right lower leg: No edema.     Left lower leg: No edema.  Lymphadenopathy:     Cervical: No cervical adenopathy.     Right cervical: No superficial, deep or posterior cervical adenopathy.    Left cervical: No superficial, deep or posterior cervical adenopathy.     Upper Body:     Right upper body: No supraclavicular or axillary adenopathy.     Left upper body: No supraclavicular or axillary adenopathy.  Neurological:     General: No focal deficit present.     Mental Status: She is alert and oriented to person, place, and time.  Psychiatric:        Mood and Affect: Mood normal.        Behavior: Behavior normal.     LABS:      Latest Ref Rng & Units 03/01/2023    8:20 AM 02/26/2023    8:19 AM 02/24/2023    8:29 AM  CBC  WBC 4.0 - 10.5 K/uL 2.8  1.5  1.8   Hemoglobin 12.0 - 15.0 g/dL 9.6  7.4  7.0   Hematocrit 36.0 - 46.0 % 29.4  22.5  21.7   Platelets 150 - 400 K/uL 120  56  55       Latest Ref Rng & Units 03/01/2023    8:20 AM 02/26/2023    8:19 AM 02/24/2023    8:29 AM  CMP  Glucose 70 - 99 mg/dL 161  88  096   BUN 8 - 23 mg/dL 5  5  7    Creatinine 0.44 - 1.00 mg/dL 0.45  4.09  8.11   Sodium 135 - 145 mmol/L 138  136  136   Potassium 3.5 - 5.1 mmol/L 2.8  2.8  3.1   Chloride 98 - 111 mmol/L 101  100  103   CO2 22 - 32 mmol/L 27  28  25    Calcium 8.9 - 10.3 mg/dL 8.2  8.2  7.9    Total Protein 6.5 - 8.1 g/dL 5.7  5.6  5.5   Total Bilirubin 0.3 - 1.2 mg/dL 0.8  0.9  1.0   Alkaline Phos 38 - 126 U/L 79  76  90   AST 15 - 41 U/L 22  25  29    ALT 0 - 44 U/L 13  14  17       Lab Results  Component Value Date   CEA1 3.8 12/16/2022   /  CEA  Date Value Ref Range Status  12/16/2022 3.8 0.0 - 4.7 ng/mL Final    Comment:    (NOTE)                             Nonsmokers          <3.9                             Smokers             <5.6 Roche [employer] Electrochemiluminescence Immunoassay (ECLIA) Values obtained with different assay methods or kits cannot be used interchangeably.  Results cannot be interpreted as absolute evidence of the presence or absence of malignant disease. Performed At: [facility] [place] [place] [address] [place], [place] [id number] Jolene SchimkeNagendra Sanjai MD WU:[id number]:[phone]    No results Manning for: "PSA1" No results Manning for: [id number]" No results Manning for: "CAN125"  No results Manning for: "TOTALPROTELP", "ALBUMINELP", "A1GS", "A2GS", "BETS", "BETA2SER", "GAMS", "MSPIKE", "SPEI" Lab Results  Component Value Date   TIBC 370 01/04/2023   FERRITIN 4 (L) 01/04/2023   IRONPCTSAT 4 (L) 01/04/2023   Lab Results  Component Value Date   LDH 136 01/04/2023     STUDIES:   CT ABDOMEN PELVIS WO CONTRAST  Result Date: 02/21/2023 CLINICAL DATA:  Emesis, bowel obstruction, rectal cancer, chemotherapy * Tracking Code: BO * EXAM: CT ABDOMEN AND PELVIS WITHOUT CONTRAST TECHNIQUE: Multidetector CT imaging of the abdomen and pelvis was performed following the standard protocol without IV contrast. RADIATION DOSE REDUCTION: This exam was performed according to the departmental dose-optimization program which includes automated exposure control, adjustment of the mA and/or kV according to patient size and/or use of iterative reconstruction technique. COMPARISON:  PET-CT, 12/31/2022 FINDINGS: Lower chest: No acute abnormality. Left coronary artery calcifications.  Hepatobiliary: No solid liver abnormality is seen. Coarse contour of the faintly calcified gallstones. No gallbladder wall thickening, or biliary dilatation. Pancreas: Unremarkable. No pancreatic ductal dilatation or surrounding inflammatory changes. Spleen: Normal in size without significant abnormality. Adrenals/Urinary Tract: Adrenal glands are unremarkable. Unchanged partially exophytic lesion of the posterior midportion of the right kidney measuring 2.9 x 2.6 cm (series 3, image 41). Bladder is unremarkable. Stomach/Bowel: Stomach is within normal limits. Appendix is not clearly visualized. Unchanged left eccentric rectal mass (series 3, image 95). Colon is fluid-filled to the rectum. Sigmoid diverticulosis. Mild wall thickening of the sigmoid colon (series 3, image 82). No evidence of bowel obstruction. Vascular/Lymphatic: Scattered aortic atherosclerosis. No enlarged abdominal or pelvic lymph nodes. Reproductive: Status post hysterectomy. Other: Small, fat containing right inguinal hernia.  No ascites. Musculoskeletal: No acute or significant osseous findings. IMPRESSION: 1. Colon is fluid-filled to the rectum, consistent with diarrheal illness. Sigmoid diverticulosis with mild wall thickening of the sigmoid colon, suggesting mild diverticulitis. 2. No evidence of bowel obstruction. 3. Unchanged rectal mass. 4. 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 Nonemergent, outpatient MRI may be helpful to more definitively characterize this lesion if desired in the setting of known primary rectal malignancy. 5. Cholelithiasis. 6. Coronary artery disease. Aortic Atherosclerosis (ICD10-I70.0). Electronically Signed   By: Jearld LeschAlex D Bibbey M.D.   On: 02/21/2023 15:07   DG Chest Portable 1 View  Result Date: 02/21/2023 CLINICAL DATA:  Weakness. On active chemotherapy. EXAM: PORTABLE  CHEST 1 VIEW  COMPARISON:  Radiograph 06/27/2021. CT 12/17/2022 FINDINGS: Right chest port with tip overlying the jugular caval junction. 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 Overall low lung volumes which limits assessment. No obvious focal airspace disease, pleural effusion or pneumothorax. IMPRESSION: Low lung volumes limit assessment. 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 Allowing for limitations, no acute findings Electronically Signed   By: Narda Rutherford M.D.   On: 02/21/2023 14:25

## 2023-03-03 ENCOUNTER — Inpatient Hospital Stay: Payer: Medicare HMO | Admitting: Hematology

## 2023-03-03 ENCOUNTER — Inpatient Hospital Stay: Payer: Medicare HMO

## 2023-03-03 ENCOUNTER — Inpatient Hospital Stay (HOSPITAL_BASED_OUTPATIENT_CLINIC_OR_DEPARTMENT_OTHER): Payer: Medicare HMO | Admitting: Hematology

## 2023-03-03 ENCOUNTER — Encounter: Payer: Self-pay | Admitting: Hematology

## 2023-03-03 VITALS — BP 116/70 | HR 84 | Temp 97.1°F | Resp 20

## 2023-03-03 DIAGNOSIS — Z5111 Encounter for antineoplastic chemotherapy: Secondary | ICD-10-CM | POA: Diagnosis not present

## 2023-03-03 DIAGNOSIS — C20 Malignant neoplasm of rectum: Secondary | ICD-10-CM

## 2023-03-03 DIAGNOSIS — E876 Hypokalemia: Secondary | ICD-10-CM

## 2023-03-03 LAB — CBC WITH DIFFERENTIAL/PLATELET
Abs Immature Granulocytes: 0.3 10*3/uL — ABNORMAL HIGH (ref 0.00–0.07)
Band Neutrophils: 2 %
Basophils Absolute: 0 10*3/uL (ref 0.0–0.1)
Basophils Relative: 0 %
Eosinophils Absolute: 0.3 10*3/uL (ref 0.0–0.5)
Eosinophils Relative: 2 %
HCT: 30 % — ABNORMAL LOW (ref 36.0–46.0)
Hemoglobin: 9.5 g/dL — ABNORMAL LOW (ref 12.0–15.0)
Lymphocytes Relative: 16 %
Lymphs Abs: 2.2 10*3/uL (ref 0.7–4.0)
MCH: 27.5 pg (ref 26.0–34.0)
MCHC: 31.7 g/dL (ref 30.0–36.0)
MCV: 86.7 fL (ref 80.0–100.0)
Metamyelocytes Relative: 2 %
Monocytes Absolute: 0.7 10*3/uL (ref 0.1–1.0)
Monocytes Relative: 5 %
Neutro Abs: 10.1 10*3/uL — ABNORMAL HIGH (ref 1.7–7.7)
Neutrophils Relative %: 73 %
Platelets: 136 10*3/uL — ABNORMAL LOW (ref 150–400)
RBC: 3.46 MIL/uL — ABNORMAL LOW (ref 3.87–5.11)
RDW: 21.9 % — ABNORMAL HIGH (ref 11.5–15.5)
WBC: 13.5 10*3/uL — ABNORMAL HIGH (ref 4.0–10.5)
nRBC: 1 /100 WBC — ABNORMAL HIGH
nRBC: 1.8 % — ABNORMAL HIGH (ref 0.0–0.2)

## 2023-03-03 LAB — COMPREHENSIVE METABOLIC PANEL
ALT: 15 U/L (ref 0–44)
AST: 25 U/L (ref 15–41)
Albumin: 2.8 g/dL — ABNORMAL LOW (ref 3.5–5.0)
Alkaline Phosphatase: 97 U/L (ref 38–126)
Anion gap: 8 (ref 5–15)
BUN: <5 mg/dL — ABNORMAL LOW (ref 8–23)
CO2: 26 mmol/L (ref 22–32)
Calcium: 8.2 mg/dL — ABNORMAL LOW (ref 8.9–10.3)
Chloride: 104 mmol/L (ref 98–111)
Creatinine, Ser: 1.01 mg/dL — ABNORMAL HIGH (ref 0.44–1.00)
GFR, Estimated: >60 mL/min (ref >60)
Glucose, Bld: 165 mg/dL — ABNORMAL HIGH (ref 70–99)
Potassium: 2.9 mmol/L — ABNORMAL LOW (ref 3.5–5.1)
Sodium: 138 mmol/L (ref 135–145)
Total Bilirubin: 1.1 mg/dL (ref 0.3–1.2)
Total Protein: 5.6 g/dL — ABNORMAL LOW (ref 6.5–8.1)

## 2023-03-03 LAB — SAMPLE TO BLOOD BANK

## 2023-03-03 LAB — MAGNESIUM: Magnesium: 1.9 mg/dL (ref 1.7–2.4)

## 2023-03-03 MED ORDER — SODIUM CHLORIDE 0.9 % IV SOLN
150.0000 mg | Freq: Once | INTRAVENOUS | Status: AC
  Administered 2023-03-03: 150 mg via INTRAVENOUS
  Filled 2023-03-03: qty 150

## 2023-03-03 MED ORDER — SODIUM CHLORIDE 0.9 % IV SOLN: Freq: Once | INTRAVENOUS | Status: AC

## 2023-03-03 MED ORDER — DEXTROSE 5 % IV SOLN: Freq: Once | INTRAVENOUS | Status: AC

## 2023-03-03 MED ORDER — ATROPINE SULFATE 1 MG/ML IV SOLN
0.5000 mg | Freq: Once | INTRAVENOUS | Status: AC
  Administered 2023-03-03: 0.5 mg via INTRAVENOUS
  Filled 2023-03-03: qty 1

## 2023-03-03 MED ORDER — PALONOSETRON HCL INJECTION 0.25 MG/5ML
0.2500 mg | Freq: Once | INTRAVENOUS | Status: AC
  Administered 2023-03-03: 0.25 mg via INTRAVENOUS
  Filled 2023-03-03: qty 5

## 2023-03-03 MED ORDER — SODIUM CHLORIDE 0.9 % IV SOLN
1920.0000 mg/m2 | INTRAVENOUS | Status: DC
  Administered 2023-03-03: 5000 mg via INTRAVENOUS
  Filled 2023-03-03: qty 100

## 2023-03-03 MED ORDER — SODIUM CHLORIDE 0.9 % IV SOLN: INTRAVENOUS | Status: DC

## 2023-03-03 MED ORDER — POTASSIUM CHLORIDE 20 MEQ PO PACK
40.0000 meq | PACK | Freq: Once | ORAL | Status: AC
  Administered 2023-03-03: 40 meq via ORAL
  Filled 2023-03-03: qty 2

## 2023-03-03 MED ORDER — SODIUM CHLORIDE 0.9 % IV SOLN
948.0000 mg | Freq: Once | INTRAVENOUS | Status: AC
  Administered 2023-03-03: 948 mg via INTRAVENOUS
  Filled 2023-03-03: qty 47.4

## 2023-03-03 MED ORDER — SODIUM CHLORIDE 0.9% FLUSH
10.0000 mL | Freq: Once | INTRAVENOUS | Status: AC
  Administered 2023-03-03: 10 mL

## 2023-03-03 MED ORDER — OXALIPLATIN CHEMO INJECTION 100 MG/20ML
130.0000 mg | Freq: Once | INTRAVENOUS | Status: AC
  Administered 2023-03-03: 130 mg via INTRAVENOUS
  Filled 2023-03-03: qty 20

## 2023-03-03 MED ORDER — SODIUM CHLORIDE 0.9 % IV SOLN
10.0000 mg | Freq: Once | INTRAVENOUS | Status: AC
  Administered 2023-03-03: 10 mg via INTRAVENOUS
  Filled 2023-03-03: qty 10

## 2023-03-03 MED ORDER — SODIUM CHLORIDE 0.9 % IV SOLN
360.0000 mg | Freq: Once | INTRAVENOUS | Status: AC
  Administered 2023-03-03: 360 mg via INTRAVENOUS
  Filled 2023-03-03: qty 18

## 2023-03-03 NOTE — Progress Notes (Signed)
Patients new BSA: 2.37 m2  Doses adjusted to reflect new weight.  V.O. Dr Carilyn Goodpasture, PharmD

## 2023-03-03 NOTE — Patient Instructions (Signed)
[facility] at [facility] Discharge Instructions   You were seen and examined today by Dr. Ellin Saba.  He reviewed the results of your lab work which are mostly normal/stable. Your potassium is low at 2.9. We will give you potassium in the clinic today.   We will proceed with your treatment today.   Return as scheduled.    Thank you for choosing [facility] at [facility] to provide your oncology and hematology care.  To afford each patient quality time with our provider, please arrive at least 15 minutes before your scheduled appointment time.   If you have a lab appointment with the Cancer Center please come in thru the Main Entrance and check in at the main information desk.  You need to re-schedule your appointment should you arrive 10 or more minutes late.  We strive to give you quality time with our providers, and arriving late affects you and other patients whose appointments are after yours.  Also, if you no show three or more times for appointments you may be dismissed from the clinic at the providers discretion.     Again, thank you for choosing [facility].  Our hope is that these requests will decrease the amount of time that you wait before being seen by our physicians.       _____________________________________________________________  Should you have questions after your visit to [facility], please contact our office at [phone] and follow the prompts.  Our office hours are 8:00 a.m. and 4:30 p.m. Monday - Friday.  Please note that voicemails left after 4:00 p.m. may not be returned until the following business day.  We are closed weekends and major holidays.  You do have access to a nurse 24-7, just call the main number to the clinic [phone] and do not press any options, hold on the line and a nurse will answer the phone.    For prescription refill requests, have your pharmacy contact our  office and allow 72 hours.    Due to Covid, you will need to wear a mask upon entering the hospital. If you do not have a mask, a mask will be given to you at the Main Entrance upon arrival. For doctor visits, patients may have 1 support person age 26 or older with them. For treatment visits, patients can not have anyone with them due to social distancing guidelines and our immunocompromised population.

## 2023-03-03 NOTE — Progress Notes (Addendum)
Patient presents today for Folfirinox infusion. Patient is in satisfactory condition with no new complaints voiced.  Vital signs are stable.  Labs reviewed by Dr. Ellin Saba during the office visit and all labs are within treatment parameters. Pt's potassium is 2.9 today, pt will receive 40 mEq potassium sulfate liquid. Pt will also receive 500 mL of normal saline over 1 hours. We will proceed with treatment per MD orders.   Folfirinox, 500 mL of NS, and potassium chloride 40 mEq liquid p.o x 1 dose given today per MD orders. Tolerated infusion without adverse affects. Vital signs stable. No complaints at this time. Discharged from clinic via wheelchair in stable condition. Alert and oriented x 3. F/U with [facility] as scheduled. 5FU ambulatory pump infusing.

## 2023-03-03 NOTE — Patient Instructions (Signed)
[facility]  Discharge Instructions: Thank you for choosing [facility] to provide your oncology and hematology care.  If you have a lab appointment with the Cancer Center - please note that after April 8th, 2024, all labs will be drawn in the cancer center.  You do not have to check in or register with the main entrance as you have in the past but will complete your check-in in the cancer center.  Wear comfortable clothing and clothing appropriate for easy access to any Portacath or PICC line.   We strive to give you quality time with your provider. You may need to reschedule your appointment if you arrive late (15 or more minutes).  Arriving late affects you and other patients whose appointments are after yours.  Also, if you miss three or more appointments without notifying the office, you may be dismissed from the clinic at the provider's discretion.      For prescription refill requests, have your pharmacy contact our office and allow 72 hours for refills to be completed.    Today you received the following chemotherapy and/or immunotherapy agents Folfifinox   To help prevent nausea and vomiting after your treatment, we encourage you to take your nausea medication as directed. Oxaliplatin Injection What is this medication? OXALIPLATIN (ox AL i PLA tin) treats colorectal cancer. It works by slowing down the growth of cancer cells. This medicine may be used for other purposes; ask your health care provider or pharmacist if you have questions. COMMON BRAND NAME(S): Eloxatin What should I tell my care team before I take this medication? They need to know if you have any of these conditions: Heart disease History of irregular heartbeat or rhythm Liver disease Low blood cell levels (white cells, red cells, and platelets) Lung or breathing disease, such as asthma Take medications that treat or prevent blood clots Tingling of the fingers, toes, or other  nerve disorder An unusual or allergic reaction to oxaliplatin, other medications, foods, dyes, or preservatives If you or your partner are pregnant or trying to get pregnant Breast-feeding How should I use this medication? This medication is injected into a vein. It is given by your care team in a hospital or clinic setting. Talk to your care team about the use of this medication in children. Special care may be needed. Overdosage: If you think you have taken too much of this medicine contact a poison control center or emergency room at once. NOTE: This medicine is only for you. Do not share this medicine with others. What if I miss a dose? Keep appointments for follow-up doses. It is important not to miss a dose. Call your care team if you are unable to keep an appointment. What may interact with this medication? Do not take this medication with any of the following: Cisapride Dronedarone Pimozide Thioridazine This medication may also interact with the following: Aspirin and aspirin-like medications Certain medications that treat or prevent blood clots, such as warfarin, apixaban, dabigatran, and rivaroxaban Cisplatin Cyclosporine Diuretics Medications for infection, such as acyclovir, adefovir, amphotericin B, bacitracin, cidofovir, foscarnet, ganciclovir, gentamicin, pentamidine, vancomycin NSAIDs, medications for pain and inflammation, such as ibuprofen or naproxen Other medications that cause heart rhythm changes Pamidronate Zoledronic acid This list may not describe all possible interactions. Give your health care provider a list of all the medicines, herbs, non-prescription drugs, or dietary supplements you use. Also tell them if you smoke, drink alcohol, or use illegal drugs. Some items may  interact with your medicine. What should I watch for while using this medication? Your condition will be monitored carefully while you are receiving this medication. You may need blood work  while taking this medication. This medication may make you feel generally unwell. This is not uncommon as chemotherapy can affect healthy cells as well as cancer cells. Report any side effects. Continue your course of treatment even though you feel ill unless your care team tells you to stop. This medication may increase your risk of getting an infection. Call your care team for advice if you get a fever, chills, sore throat, or other symptoms of a cold or flu. Do not treat yourself. Try to avoid being around people who are sick. Avoid taking medications that contain aspirin, acetaminophen, ibuprofen, naproxen, or ketoprofen unless instructed by your care team. These medications may hide a fever. Be careful brushing or flossing your teeth or using a toothpick because you may get an infection or bleed more easily. If you have any dental work done, tell your dentist you are receiving this medication. This medication can make you more sensitive to cold. Do not drink cold drinks or use ice. Cover exposed skin before coming in contact with cold temperatures or cold objects. When out in cold weather wear warm clothing and cover your mouth and nose to warm the air that goes into your lungs. Tell your care team if you get sensitive to the cold. Talk to your care team if you or your partner are pregnant or think either of you might be pregnant. This medication can cause serious birth defects if taken during pregnancy and for 9 months after the last dose. A negative pregnancy test is required before starting this medication. A reliable form of contraception is recommended while taking this medication and for 9 months after the last dose. Talk to your care team about effective forms of contraception. Do not father a child while taking this medication and for 6 months after the last dose. Use a condom while having sex during this time period. Do not breastfeed while taking this medication and for 3 months after the last  dose. This medication may cause infertility. Talk to your care team if you are concerned about your fertility. What side effects may I notice from receiving this medication? Side effects that you should report to your care team as soon as possible: Allergic reactions--skin rash, itching, hives, swelling of the face, lips, tongue, or throat Bleeding--bloody or black, tar-like stools, vomiting blood or brown material that looks like coffee grounds, red or dark brown urine, small red or purple spots on skin, unusual bruising or bleeding Dry cough, shortness of breath or trouble breathing Heart rhythm changes--fast or irregular heartbeat, dizziness, feeling faint or lightheaded, chest pain, trouble breathing Infection--fever, chills, cough, sore throat, wounds that don't heal, pain or trouble when passing urine, general feeling of discomfort or being unwell Liver injury--right upper belly pain, loss of appetite, nausea, light-colored stool, dark yellow or brown urine, yellowing skin or eyes, unusual weakness or fatigue Low red blood cell level--unusual weakness or fatigue, dizziness, headache, trouble breathing Muscle injury--unusual weakness or fatigue, muscle pain, dark yellow or brown urine, decrease in amount of urine Pain, tingling, or numbness in the hands or feet Sudden and severe headache, confusion, change in vision, seizures, which may be signs of posterior reversible encephalopathy syndrome (PRES) Unusual bruising or bleeding Side effects that usually do not require medical attention (report to your care team if they  continue or are bothersome): Diarrhea Nausea Pain, redness, or swelling with sores inside the mouth or throat Unusual weakness or fatigue Vomiting This list may not describe all possible side effects. Call your doctor for medical advice about side effects. You may report side effects to FDA at 1-800-FDA-1088. Where should I keep my medication? This medication is given in a  hospital or clinic. It will not be stored at home. NOTE: This sheet is a summary. It may not cover all possible information. If you have questions about this medicine, talk to your doctor, pharmacist, or health care provider.  2023 Elsevier/Gold Standard (2007-12-31 00:00:00)  Irinotecan Injection Quel est ce mdicament ? L?IRINOTCAN traite certains types de cancer. Il agit en ralentissant la croissance des cellules cancreuses. Ce mdicament peut tre utilis pour d'autres indications ; adressez-vous  votre mdecin ou  votre pharmacien si vous [employer] questions. NOM(S) DE MARQUE COURANT(S) : Camptosar Que dois-je dire  mon fournisseur de Consulting civil engineer de prendre ce mdicament ? Votre quipe de soins a besoin de savoir si vous tes dans l?une des situations suivantes : Dshydratation Diarrhe Infection, en particulier une infection virale, telle que la varicelle, les boutons de fivre et l'herps Google Faible taux de cellules sanguines (globules blancs, globules rouges et plaquettes) Faibles taux d'lectrolytes sanguins, tels que le calcium, le magnsium ou le potassium Radiothrapie rcente ou en cours Raction inhabituelle ou allergique  l?irinotcan,  d?autres mdicaments,  des aliments,  des colorants ou  des conservateurs Vous ou votre partenaire tes enceinte ou essayez de concevoir un enfant Allaitement Comment dois-je utiliser ce mdicament ? Ce mdicament est destin  tre inject dans une veine. Il est administr par votre quipe de [employer] un tablissement [employer] ou un Biochemist, clinical. [employer] toute information relative  l'utilisation de ce mdicament [employer], Electrical engineer votre quipe de Dix. Des prcautions particulires peuvent s?avrer ncessaires. Surdosage : si vous pensez avoir pris ce mdicament en excs, contactez immdiatement un centre [employer]. REMARQUE : ce mdicament vous est uniquement destin. Ne partagez  pas ce Arts administrator. Que faire si je saute une dose ? Prsentez-vous  tous les rendez-vous pour recevoir les doses suivantes. Il est important de ne pas omettre votre dose. Appelez votre quipe de soins si vous ne pouvez pas Barrister's clerk. Quelles sont les interactions possibles avec ce mdicament ? Ne prenez pas ce General Motors substances suivantes : Cobicistat Itraconazole Ce mdicament peut galement interagir avec les substances suivantes : Certains antibiotiques, tels que la clarithromycine, la rifampicine et la rifabutine Certains antiviraux Mudlogger VIH ou le SIDA Certains mdicaments contre les infections fongiques, tels que [employer], le posaconazole et le voriconazole Certains mdicaments contre les crises d'pilepsie, tels que la carbamazpine, le phnobarbital et la phnytone Gemfibrozil Nfazodone Millepertuis (St. John?s Wort) Cette liste peut ne pas dcrire toutes les interactions mdicamenteuses possibles. Donnez  votre fournisseur de [employer] de tous les mdicaments, plantes mdicinales, mdicaments en vente libre ou complments alimentaires que vous prenez. Dites-lui aussi si vous fumez, buvez de l'alcool ou consommez des drogues illicites. Certaines substances peuvent interagir Writer. Que dois-je IT consultant par ce mdicament ? Votre tat de sant sera surveill attentivement durant le traitement par [facility]. Vous devrez Geographical information systems officer des analyses de sang Art therapist par [employer] [facility] [employer]. Ce mdicament peut entraner une sensation de malaise gnral. Cela n?a rien d?exceptionnel puisque la chimiothrapie est susceptible  d?affecter aussi bien les cellules saines que les cellules cancreuses. Signalez Product/process development scientist. Continuez votre traitement mme si vous vous sentez malade, sauf si votre quipe de soins vous demande de Transport planner. Ce mdicament peut  provoquer de graves effets secondaires. Pour rduire [employer], votre quipe de soins peut vous donner d?autres mdicaments  prendre avant de recevoir celui-ci. Veillez  suivre les instructions fournies par votre quipe de soins. Ce mdicament peut affecter votre coordination, votre temps de raction ou votre jugement. Ne conduisez pas ou n'utilisez pas de machines tant que vous ignorez l'effet que ce mdicament a sur vous. Redressez-vous ou levez-vous lentement pour rduire [employer] de vertiges ou d'vanouissements. La Public affairs consultant ce mdicament peut accrotre le risque de survenue de ces effets secondaires. Ce mdicament peut accrotre votre risque de contracter une infection. Demandez conseil  votre quipe de soins si vous avez de la fivre, des frissons, un mal de gorge ou d'autres symptmes de rhume ou de grippe. Ne pratiquez pas l?automdication. vitez de vous approcher des [employer]. vitez de prendre des Citigroup de l'aspirine, du paractamol (acetaminophen), de l'ibuprofne, du naproxne ou du ktoprofne, sauf indication contraire de votre quipe de [place]. Ces mdicaments peuvent masquer une fivre. Ce mdicament peut accrotre le risque d?hmatomes (bleus) ou de saignement. Appelez votre quipe de soins si vous remarquez tout saignement anormal. Myrtice Lauth prudent(e) lorsque vous vous brossez les dents ou que vous utilisez du fil dentaire ou un cure-dents, car vous pouvez dvelopper une infection ou saigner plus facilement. Si vous devez [employer], dites  votre dentiste que vous tes sous traitement par [facility]. Si vous ou votre partenaire tes ou pensez tre enceinte, dites-le  votre quipe de soins. Ce mdicament peut provoquer de graves anomalies congnitales en cas de prise au cours de la grossesse et pendant les 6 mois qui suivent la dernire dose. Vous devrez effectuer un test de grossesse et avoir un rsultat ngatif avant de  Marketing executive par [facility]. Il est recommand d?utiliser une mthode de contraception Warehouse manager par ce mdicament et au cours des 6 mois qui suivent la dernire dose. Votre quipe de Surveyor, quantity  trouver l?option Writer. Vous ne devez pas concevoir un enfant Warehouse manager par ce mdicament ni au cours des 3 mois suivant la prise de la dernire dose. Lisbeth Renshaw un prservatif comme mthode de contraception pendant cette priode. N?allaitez pas Art therapist par ce mdicament ni pendant 7 jours aprs la prise de la dernire dose. Ce mdicament peut entraner une strilit. Consultez votre quipe de soins si vous avez des inquitudes Oncologist. Quels effets secondaires puis-je remarquer en prenant ce mdicament ? Effets secondaires que vous devez signaler  votre quipe de soins ds que possible : Ractions allergiques : ruption cutane, dmangeaisons, urticaire, gonflement du visage, des lvres, de la langue ou de la gorge Toux sche, essoufflements ou difficults  respirer Augmentation de la salive ou des larmes, transpiration accrue, crampes d?estomac, diarrhe, rtrcissement des pupilles, faiblesse ou fatigue inhabituelle, rythme cardiaque lent Infection : fivre, frissons, toux, mal de gorge, plaies qui ne cicatrisent pas, douleurs ou difficults pour uriner, sensation d?inconfort ou malaise gnral Atteinte des reins : miction (mission d'urine) rduite, gonflement des [place], des mains ou des pieds Faible numration des globules rouges : faiblesse ou fatigue inhabituelle, vertiges, mal de tte, difficults  respirer Diarrhe svre ou prolonge Saignements ou ecchymoses (bleus) inhabituels Effets  secondaires ne ncessitant gnralement pas un avis mdical (signalez-les  votre quipe de soins s'ils persistent ou sont gnants) : Constipation Diarrhe Perte des cheveux/poils Perte  d'apptit Nauses Douleurs  l?estomac Cette liste peut ne pas dcrire tous les effets secondaires possibles. Appelez votre mdecin pour [employer] sujet des [employer]. Vous pouvez signaler les effets secondaires  la FDA, au [phone]. O dois-je conserver mon mdicament ? Ce mdicament est administr dans un tablissement Mattel. Il ne sera pas conserv  domicile. REMARQUE : cette notice est un rsum. Elle peut ne pas contenir toutes les informations possibles. Si vous avez des questions  propos de ce mdicament, Patent attorney mdecin, votre pharmacien ou votre fournisseur de [place].  2023 Elsevier/Gold Standard (2022-09-30 00:00:00)  Leucovorin Injection What is this medication? LEUCOVORIN (loo koe VOR in) prevents side effects from certain medications, such as methotrexate. It works by increasing folate levels. This helps protect healthy cells in your body. It may also be used to treat anemia caused by low levels of folate. It can also be used with fluorouracil, a type of chemotherapy, to treat colorectal cancer. It works by increasing the effects of fluorouracil in the body. This medicine may be used for other purposes; ask your health care provider or pharmacist if you have questions. What should I tell my care team before I take this medication? They need to know if you have any of these conditions: Anemia from low levels of vitamin B12 in the blood An unusual or allergic reaction to leucovorin, folic acid, other medications, foods, dyes, or preservatives Pregnant or trying to get pregnant Breastfeeding How should I use this medication? This medication is injected into a vein or a muscle. It is given by your care team in a hospital or clinic setting. Talk to your care team about the use of this medication in children. Special care may be needed. Overdosage: If you think you have taken too much of this medicine contact a poison  control center or emergency room at once. NOTE: This medicine is only for you. Do not share this medicine with others. What if I miss a dose? Keep appointments for follow-up doses. It is important not to miss your dose. Call your care team if you are unable to keep an appointment. What may interact with this medication? Capecitabine Fluorouracil Phenobarbital Phenytoin Primidone Trimethoprim;sulfamethoxazole This list may not describe all possible interactions. Give your health care provider a list of all the medicines, herbs, non-prescription drugs, or dietary supplements you use. Also tell them if you smoke, drink alcohol, or use illegal drugs. Some items may interact with your medicine. What should I watch for while using this medication? Your condition will be monitored carefully while you are receiving this medication. This medication may increase the side effects of 5-fluorouracil. Tell your care team if you have diarrhea or mouth sores that do not get better or that get worse. What side effects may I notice from receiving this medication? Side effects that you should report to your care team as soon as possible: Allergic reactions--skin rash, itching, hives, swelling of the face, lips, tongue, or throat This list may not describe all possible side effects. Call your doctor for medical advice about side effects. You may report side effects to FDA at 1-800-FDA-1088. Where should I keep my medication? This medication is given in a hospital or clinic. It will not be stored at home. NOTE: This sheet is a summary. It may not  cover all possible information. If you have questions about this medicine, talk to your doctor, pharmacist, or health care provider.  2023 Elsevier/Gold Standard (2022-03-20 00:00:00)  Fluorouracil Injection What is this medication? FLUOROURACIL (flure oh YOOR a sil) treats some types of cancer. It works by slowing down the growth of cancer cells. This medicine may  be used for other purposes; ask your health care provider or pharmacist if you have questions. COMMON BRAND NAME(S): Adrucil What should I tell my care team before I take this medication? They need to know if you have any of these conditions: Blood disorders Dihydropyrimidine dehydrogenase (DPD) deficiency Infection, such as chickenpox, cold sores, herpes Kidney disease Liver disease Poor nutrition Recent or ongoing radiation therapy An unusual or allergic reaction to fluorouracil, other medications, foods, dyes, or preservatives If you or your partner are pregnant or trying to get pregnant Breast-feeding How should I use this medication? This medication is injected into a vein. It is administered by your care team in a hospital or clinic setting. Talk to your care team about the use of this medication in children. Special care may be needed. Overdosage: If you think you have taken too much of this medicine contact a poison control center or emergency room at once. NOTE: This medicine is only for you. Do not share this medicine with others. What if I miss a dose? Keep appointments for follow-up doses. It is important not to miss your dose. Call your care team if you are unable to keep an appointment. What may interact with this medication? Do not take this medication with any of the following: Live virus vaccines This medication may also interact with the following: Medications that treat or prevent blood clots, such as warfarin, enoxaparin, dalteparin This list may not describe all possible interactions. Give your health care provider a list of all the medicines, herbs, non-prescription drugs, or dietary supplements you use. Also tell them if you smoke, drink alcohol, or use illegal drugs. Some items may interact with your medicine. What should I watch for while using this medication? Your condition will be monitored carefully while you are receiving this medication. This medication may  make you feel generally unwell. This is not uncommon as chemotherapy can affect healthy cells as well as cancer cells. Report any side effects. Continue your course of treatment even though you feel ill unless your care team tells you to stop. In some cases, you may be given additional medications to help with side effects. Follow all directions for their use. This medication may increase your risk of getting an infection. Call your care team for advice if you get a fever, chills, sore throat, or other symptoms of a cold or flu. Do not treat yourself. Try to avoid being around people who are sick. This medication may increase your risk to bruise or bleed. Call your care team if you notice any unusual bleeding. Be careful brushing or flossing your teeth or using a toothpick because you may get an infection or bleed more easily. If you have any dental work done, tell your dentist you are receiving this medication. Avoid taking medications that contain aspirin, acetaminophen, ibuprofen, naproxen, or ketoprofen unless instructed by your care team. These medications may hide a fever. Do not treat diarrhea with over the counter products. Contact your care team if you have diarrhea that lasts more than 2 days or if it is severe and watery. This medication can make you more sensitive to the sun. Keep out  of the sun. If you cannot avoid being in the sun, wear protective clothing and sunscreen. Do not use sun lamps, tanning beds, or tanning booths. Talk to your care team if you or your partner wish to become pregnant or think you might be pregnant. This medication can cause serious birth defects if taken during pregnancy and for 3 months after the last dose. A reliable form of contraception is recommended while taking this medication and for 3 months after the last dose. Talk to your care team about effective forms of contraception. Do not father a child while taking this medication and for 3 months after the last  dose. Use a condom while having sex during this time period. Do not breastfeed while taking this medication. This medication may cause infertility. Talk to your care team if you are concerned about your fertility. What side effects may I notice from receiving this medication? Side effects that you should report to your care team as soon as possible: Allergic reactions--skin rash, itching, hives, swelling of the face, lips, tongue, or throat Heart attack--pain or tightness in the chest, shoulders, arms, or jaw, nausea, shortness of breath, cold or clammy skin, feeling faint or lightheaded Heart failure--shortness of breath, swelling of the ankles, feet, or hands, sudden weight gain, unusual weakness or fatigue Heart rhythm changes--fast or irregular heartbeat, dizziness, feeling faint or lightheaded, chest pain, trouble breathing High ammonia level--unusual weakness or fatigue, confusion, loss of appetite, nausea, vomiting, seizures Infection--fever, chills, cough, sore throat, wounds that don't heal, pain or trouble when passing urine, general feeling of discomfort or being unwell Low red blood cell level--unusual weakness or fatigue, dizziness, headache, trouble breathing Pain, tingling, or numbness in the hands or feet, muscle weakness, change in vision, confusion or trouble speaking, loss of balance or coordination, trouble walking, seizures Redness, swelling, and blistering of the skin over hands and feet Severe or prolonged diarrhea Unusual bruising or bleeding Side effects that usually do not require medical attention (report to your care team if they continue or are bothersome): Dry skin Headache Increased tears Nausea Pain, redness, or swelling with sores inside the mouth or throat Sensitivity to light Vomiting This list may not describe all possible side effects. Call your doctor for medical advice about side effects. You may report side effects to FDA at 1-800-FDA-1088. Where  should I keep my medication? This medication is given in a hospital or clinic. It will not be stored at home. NOTE: This sheet is a summary. It may not cover all possible information. If you have questions about this medicine, talk to your doctor, pharmacist, or health care provider.  2023 [employer]/Gold Standard (2022-03-10 00:00:00)       BELOW ARE SYMPTOMS THAT SHOULD BE REPORTED IMMEDIATELY: *FEVER GREATER THAN 100.4 F (38 C) OR HIGHER *CHILLS OR SWEATING *NAUSEA AND VOMITING THAT IS NOT CONTROLLED WITH YOUR NAUSEA MEDICATION *UNUSUAL SHORTNESS OF BREATH *UNUSUAL BRUISING OR BLEEDING *URINARY PROBLEMS (pain or burning when urinating, or frequent urination) *BOWEL PROBLEMS (unusual diarrhea, constipation, pain near the anus) TENDERNESS IN MOUTH AND THROAT WITH OR WITHOUT PRESENCE OF ULCERS (sore throat, sores in mouth, or a toothache) UNUSUAL RASH, SWELLING OR PAIN  UNUSUAL VAGINAL DISCHARGE OR ITCHING   Items with * indicate a potential emergency and should be followed up as soon as possible or go to the Emergency Department if any problems should occur.  Please show the CHEMOTHERAPY ALERT CARD or IMMUNOTHERAPY ALERT CARD at check-in to the Emergency Department and triage nurse.  Should you have questions after your visit or need to cancel or reschedule your appointment, please contact [facility] [phone]  and follow the prompts.  Office hours are 8:00 a.m. to 4:30 p.m. Monday - Friday. Please note that voicemails left after 4:00 p.m. may not be returned until the following business day.  We are closed weekends and major holidays. You have access to a nurse at all times for urgent questions. Please call the main number to the clinic [phone] and follow the prompts.  For any non-urgent questions, you may also contact your provider using MyChart. We now offer e-Visits for anyone 71 and older to request care online for non-urgent symptoms. For details  visit [email or web address].   Also download the MyChart app! Go to the app store, search "MyChart", open the app, select [place], and log in with your MyChart username and password.

## 2023-03-03 NOTE — Progress Notes (Signed)
Patient has been examined by Dr. Katragadda. Vital signs and labs have been reviewed by MD - ANC, Creatinine, LFTs, hemoglobin, and platelets are within treatment parameters per M.D. - pt may proceed with treatment.  Primary RN and pharmacy notified.  

## 2023-03-05 ENCOUNTER — Ambulatory Visit: Payer: Medicare HMO | Admitting: Nurse Practitioner

## 2023-03-05 ENCOUNTER — Encounter: Payer: Self-pay | Admitting: Nurse Practitioner

## 2023-03-05 ENCOUNTER — Inpatient Hospital Stay: Payer: Medicare HMO

## 2023-03-05 VITALS — BP 97/67 | HR 87 | Temp 98.5°F | Resp 18

## 2023-03-05 VITALS — BP 110/70 | HR 64 | Ht 67.0 in | Wt 265.0 lb

## 2023-03-05 DIAGNOSIS — E118 Type 2 diabetes mellitus with unspecified complications: Secondary | ICD-10-CM | POA: Diagnosis not present

## 2023-03-05 DIAGNOSIS — C20 Malignant neoplasm of rectum: Secondary | ICD-10-CM

## 2023-03-05 DIAGNOSIS — I10 Essential (primary) hypertension: Secondary | ICD-10-CM | POA: Diagnosis not present

## 2023-03-05 DIAGNOSIS — E782 Mixed hyperlipidemia: Secondary | ICD-10-CM

## 2023-03-05 DIAGNOSIS — E890 Postprocedural hypothyroidism: Secondary | ICD-10-CM | POA: Diagnosis not present

## 2023-03-05 DIAGNOSIS — Z794 Long term (current) use of insulin: Secondary | ICD-10-CM

## 2023-03-05 DIAGNOSIS — E559 Vitamin D deficiency, unspecified: Secondary | ICD-10-CM

## 2023-03-05 DIAGNOSIS — Z5111 Encounter for antineoplastic chemotherapy: Secondary | ICD-10-CM | POA: Diagnosis not present

## 2023-03-05 MED ORDER — LANTUS SOLOSTAR 100 UNIT/ML ~~LOC~~ SOPN: 40.0000 [IU] | PEN_INJECTOR | Freq: Every day | SUBCUTANEOUS | 0 refills | Status: DC

## 2023-03-05 MED ORDER — SODIUM CHLORIDE 0.9% FLUSH
10.0000 mL | INTRAVENOUS | Status: DC | PRN
  Administered 2023-03-05: 10 mL

## 2023-03-05 MED ORDER — HEPARIN SOD (PORK) LOCK FLUSH 100 UNIT/ML IV SOLN
500.0000 [IU] | Freq: Once | INTRAVENOUS | Status: AC | PRN
  Administered 2023-03-05: 500 [IU]

## 2023-03-05 MED ORDER — PEGFILGRASTIM-CBQV 6 MG/0.6ML ~~LOC~~ SOSY
6.0000 mg | PREFILLED_SYRINGE | Freq: Once | SUBCUTANEOUS | Status: AC
  Administered 2023-03-05: 6 mg via SUBCUTANEOUS
  Filled 2023-03-05: qty 0.6

## 2023-03-05 NOTE — Progress Notes (Signed)
Patients port flushed without difficulty.  Good blood return noted with no bruising or swelling noted at site.  Band aid applied. Home infusion 5FU pump disconnected with no issues.  VSS with discharge and left in satisfactory condition with no s/s of distress noted.

## 2023-03-05 NOTE — Progress Notes (Signed)
03/05/2023               Endocrinology follow-up note   Subjective:    Patient ID: [name], female    DOB: 10-28-1956.  She is returning for follow-up for management of type 2 diabetes, RAI induced hypothyroidism, hyperlipidemia.  PMD:   Gwenlyn Found, MD  Past Medical History:  Diagnosis Date   Anxiety    Cancer    Coronary atherosclerosis    Cardiac CT 09/2018 with calcium score 8.7 and mild proximal LAD disease   Essential hypertension 2009   GERD (gastroesophageal reflux disease)    Hypothyroidism    Iron deficiency anemia    Osteoarthritis    Palpitations    Tachypalpitations on beta blockers since 2010   Type 2 diabetes mellitus 2003   Vitamin B 12 deficiency    Vitamin D deficiency    Past Surgical History:  Procedure Laterality Date   ABDOMINAL HYSTERECTOMY     History of cervical cancer   BIOPSY  12/16/2022   Procedure: BIOPSY;  Surgeon: Corbin Ade, MD;  [facility];  Service: Endoscopy;;  gastric, rectal   BREAST BIOPSY Right    COLONOSCOPY WITH PROPOFOL N/A 12/16/2022   Procedure: COLONOSCOPY WITH PROPOFOL;  Surgeon: Corbin Ade, MD;  [facility];  Service: Endoscopy;  Laterality: N/A;  10:45 am   ESOPHAGOGASTRODUODENOSCOPY (EGD) WITH PROPOFOL N/A 12/16/2022   Procedure: ESOPHAGOGASTRODUODENOSCOPY (EGD) WITH PROPOFOL;  Surgeon: Corbin Ade, MD;  [facility];  Service: Endoscopy;  Laterality: N/A;   IR IMAGING GUIDED PORT INSERTION  01/15/2023   MALONEY DILATION N/A 12/16/2022   Procedure: Elease Hashimoto DILATION;  Surgeon: Corbin Ade, MD;  [facility];  Service: Endoscopy;  Laterality: N/A;   POLYPECTOMY  12/16/2022   Procedure: POLYPECTOMY;  Surgeon: Corbin Ade, MD;  [facility];  Service: Endoscopy;;   TONSILLECTOMY     Social History   Socioeconomic History   Marital status: Single    Spouse name: Not on file   Number of children: 2   Years of education: Not on file    Highest education level: Not on file  Occupational History   Occupation: DISABLED    Employer: UNEMPLOYED    Comment: [employer]OAR[employer]  Tobacco Use   Smoking status: Never   Smokeless tobacco: Never  Vaping Use   Vaping Use: Never used  Substance and Sexual Activity   Alcohol use: No   Drug use: No   Sexual activity: Never  Other Topics Concern   Not on file  Social History Narrative   Has 2 grandchildren. 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   Social Determinants of Health   Financial Resource Strain: Not on file  Food Insecurity: No Food Insecurity (01/06/2023)   Hunger Vital Sign    Worried About Running Out of Food in the Last Year: Never true    Ran Out of Food in the Last Year: Never true  Transportation Needs: No Transportation Needs (01/06/2023)   PRAPARE - Administrator, Civil Service (Medical): No    Lack of Transportation (Non-Medical): No  Physical Activity: Not on file  Stress: Not on file  Social Connections: Not on file   Outpatient Encounter Medications as of 03/05/2023  Medication Sig   blood glucose meter kit and supplies KIT Dispense based on patient and insurance preference. Use up to four times daily as  directed. (FOR ICD-10 E11.65) Number of Strips 400 Number of Lancets 400   Cholecalciferol (VITAMIN D3) 5000 units TABS Take 5,000 Units by mouth daily.   cyanocobalamin (VITAMIN B12) 1000 MCG/ML injection B12 shots at a frequency of once weekly for 4 weeks then can drop down to once monthly thereafter.   cyclobenzaprine (FLEXERIL) 10 MG tablet Take 10 mg by mouth 3 (three) times daily as needed for muscle spasms.   dextrose 5 % SOLN 1,000 mL with fluorouracil 5 GM/100ML SOLN Inject into the vein over 48 hr. Every 14 days   docusate sodium (COLACE) 100 MG capsule Take 1 capsule (100 mg total) by mouth 2 (two) times daily. Do NOT take this if you have diarrhea.  Try to maintain soft easy to pass bowel movements.   DROPLET PEN  NEEDLES 31G X 5 MM MISC USE AS INSTRUCTED TO INJECT INSULIN DAILY   FLUOROURACIL IV Inject into the vein every 14 (fourteen) days.   FLUoxetine (PROZAC) 20 MG capsule Take 20 mg by mouth daily.   folic acid (FOLVITE) 1 MG tablet Take 1 tablet (1 mg total) by mouth daily.   HYDROcodone-acetaminophen (NORCO) 5-325 MG tablet Take 1 tablet by mouth every 6 (six) hours as needed for severe pain.   insulin aspart (NOVOLOG FLEXPEN) 100 UNIT/ML FlexPen Inject 0-6 Units into the skin 3 (three) times daily with meals.   IRINOTECAN HCL IV Inject into the vein every 14 (fourteen) days.   LEUCOVORIN CALCIUM IV Inject into the vein every 14 (fourteen) days.   levothyroxine (SYNTHROID) 88 MCG tablet TAKE 1 TABLET EVERY DAY BEFORE BREAKFAST   loperamide (IMODIUM) 2 MG capsule Take 1 capsule (2 mg total) by mouth as needed for diarrhea or loose stools.   magnesium oxide (MAG-OX) 400 (240 Mg) MG tablet Take 1 tablet (400 mg total) by mouth 2 (two) times daily.   megestrol (MEGACE) 400 MG/10ML suspension Take 10 mLs (400 mg total) by mouth daily.   OLANZapine (ZYPREXA) 5 MG tablet Take 1 tablet (5 mg total) by mouth at bedtime.   ondansetron (ZOFRAN-ODT) 4 MG disintegrating tablet Take 2 tablets (8 mg total) by mouth every 8 (eight) hours as needed for refractory nausea / vomiting. Take in between doses of Compazine to maximize symptom relief.   OXALIPLATIN IV Inject into the vein every 14 (fourteen) days.   pantoprazole (PROTONIX) 40 MG tablet Take 1 tablet (40 mg total) by mouth 2 (two) times daily before a meal.   Potassium Chloride 40 MEQ/15ML (20%) SOLN Take 15 mLs by mouth in the morning and at bedtime.   pregabalin (LYRICA) 100 MG capsule Take 1 capsule (100 mg total) by mouth 2 (two) times daily.   prochlorperazine (COMPAZINE) 10 MG tablet Take 1 tablet (10 mg total) by mouth every 6 (six) hours as needed for nausea or vomiting.   sucralfate (CARAFATE) 1 GM/10ML suspension Take 10 mLs (1 g total) by mouth 4  (four) times daily. Swish and swallow   TRUE METRIX BLOOD GLUCOSE TEST test strip TEST BLOOD SUGAR TWICE DAILY   [DISCONTINUED] insulin glargine (LANTUS SOLOSTAR) 100 UNIT/ML Solostar Pen Inject 60 Units into the skin at bedtime.   [DISCONTINUED] tirzepatide (MOUNJARO) 10 MG/0.5ML Pen INJECT 10MG  (1 PEN) UNDER THE SKIN ONE TIME WEEKLY   insulin glargine (LANTUS SOLOSTAR) 100 UNIT/ML Solostar Pen Inject 40 Units into the skin at bedtime.   Facility-Administered Encounter Medications as of 03/05/2023  Medication   0.9 %  sodium chloride infusion  ALLERGIES: Allergies  Allergen Reactions   Contrast Media [Iodinated Contrast Media] Other (See Comments)    Burning   Sulfa Antibiotics Nausea And Vomiting    Other reaction(s): GI Upset (intolerance) unknown   Doxepin Rash and Swelling   Tape Rash   VACCINATION STATUS: Immunization History  Administered Date(s) Administered   Tdap 11/24/2007    Diabetes She presents for her follow-up diabetic visit. She has type 2 diabetes mellitus. Onset time: She was diagnosed at approximate age of 41 years. Her disease course has been improving. Hypoglycemia symptoms include nervousness/anxiousness, sweats and tremors. Pertinent negatives for hypoglycemia include no confusion, pallor or seizures. Associated symptoms include fatigue and foot paresthesias. Pertinent negatives for diabetes include no polydipsia, no polyphagia, no polyuria and no weight loss. Hypoglycemia complications include nocturnal hypoglycemia. Symptoms are stable. Diabetic complications include heart disease and peripheral neuropathy. Risk factors for coronary artery disease include diabetes mellitus, dyslipidemia, hypertension, obesity, family history, sedentary lifestyle, post-menopausal and stress. Current diabetic treatment includes insulin injections and oral agent (monotherapy) (and Mounjaro). She is compliant with treatment all of the time. Her weight is fluctuating minimally. She is  following a generally healthy diet. When asked about meal planning, she reported none. She has not had a previous visit with a dietitian. She participates in exercise intermittently (has been limited lately due to fatigue). Her home blood glucose trend is decreasing steadily. Her overall blood glucose range is 90-110 mg/dl. (She presents today with her meter and logs showing tightening glycemic profile.  Her most recent A1c was 6.6% on 12/24/22.  She is currently undergoing chemotherapy for rectal cancer which has dramatically affected her diet.  She has not taken her Novolog in some time due to not eating/ not meeting glucose requirements to do so.  Analysis of her meter shows 7-day average of 107, 14-day average of 106, 30-day average of 114, 90-day average of 141.) An ACE inhibitor/angiotensin II receptor blocker is being taken. She does not see a podiatrist.Eye exam is current.  Hyperlipidemia This is a chronic problem. The current episode started more than 1 year ago. The problem is controlled. Recent lipid tests were reviewed and are normal. Exacerbating diseases include chronic renal disease, diabetes, hypothyroidism and obesity. There are no known factors aggravating her hyperlipidemia. Pertinent negatives include no myalgias. Current antihyperlipidemic treatment includes statins and exercise. The current treatment provides moderate improvement of lipids. There are no compliance problems.  Risk factors for coronary artery disease include dyslipidemia, diabetes mellitus, hypertension, obesity, a sedentary lifestyle, family history and post-menopausal.  Thyroid Problem Presents for follow-up visit. Symptoms include anxiety, fatigue and tremors. Patient reports no cold intolerance, constipation, depressed mood, diarrhea, hair loss, heat intolerance, leg swelling, palpitations, weight gain or weight loss. The symptoms have been stable (She was given I-131 therapy for hyperthyroidism on May 16, 2018.). Her  past medical history is significant for diabetes and hyperlipidemia.    Review of systems  Constitutional: + minimally fluctuating body weight,  current Body mass index is 41.5 kg/m. ,+ fatigue, no subjective hyperthermia, no subjective hypothermia, decreased appetite (SE from chemo) Eyes: no blurry vision, no xerophthalmia ENT: no sore throat, no nodules palpated in throat, no dysphagia/odynophagia, no hoarseness Cardiovascular: no chest pain, no shortness of breath, no palpitations, no leg swelling Respiratory: no cough, no shortness of breath Musculoskeletal: no muscle/joint aches Skin: no rashes, no hyperemia Neurological: no tremors, no dizziness, + numbness/tingling to BLE with pins and needles in toes (worse at night)-stable on Lyrica Psychiatric: +  depression/anxiety -controlled   Objective:    BP 110/70 (BP Location: Left Arm, Patient Position: Sitting, Cuff Size: Normal)   Pulse 64   Ht 5\' 7"  (1.702 m)   Wt 265 lb (120.2 kg)   BMI 41.50 kg/m   Wt Readings from Last 3 Encounters:  03/05/23 265 lb (120.2 kg)  03/03/23 261 lb 14.4 oz (118.8 kg)  02/24/23 265 lb 8 oz (120.4 kg)    BP Readings from Last 3 Encounters:  03/05/23 110/70  03/03/23 116/70  03/03/23 109/89    Physical Exam- Limited  Constitutional:  Body mass index is 41.5 kg/m. , not in acute distress, normal state of mind Eyes:  EOMI, no exophthalmos Musculoskeletal: no gross deformities, strength intact in all four extremities, no gross restriction of joint movements Skin:  no rashes, no hyperemia Neurological: no tremor with outstretched hands   Diabetic Foot Exam - Simple   No data filed     Recent Results (from the past 2160 hour(s))  CBC with Differential/Platelet     Status: Abnormal   Collection Time: 12/14/22 11:22 AM  Result Value Ref Range   WBC 7.9 4.0 - 10.5 K/uL   RBC 4.03 3.87 - 5.11 MIL/uL   Hemoglobin 8.5 (L) 12.0 - 15.0 g/dL    Comment: Reticulocyte Hemoglobin testing may  be clinically indicated, consider ordering this additional test [id number]    HCT 31.4 (L) 36.0 - 46.0 %   MCV 77.9 (L) 80.0 - 100.0 fL   MCH 21.1 (L) 26.0 - 34.0 pg   MCHC 27.1 (L) 30.0 - 36.0 g/dL   RDW 64.4 (H) 03.4 - 74.2 %   Platelets 209 150 - 400 K/uL   nRBC 0.0 0.0 - 0.2 %   Neutrophils Relative % 69 %   Neutro Abs 5.5 1.7 - 7.7 K/uL   Lymphocytes Relative 19 %   Lymphs Abs 1.5 0.7 - 4.0 K/uL   Monocytes Relative 8 %   Monocytes Absolute 0.7 0.1 - 1.0 K/uL   Eosinophils Relative 2 %   Eosinophils Absolute 0.2 0.0 - 0.5 K/uL   Basophils Relative 1 %   Basophils Absolute 0.1 0.0 - 0.1 K/uL   Immature Granulocytes 1 %   Abs Immature Granulocytes 0.05 0.00 - 0.07 K/uL    Comment: Performed at [facility], [address] [place], [place] [address]  Basic metabolic panel     Status: Abnormal   Collection Time: 12/14/22 11:22 AM  Result Value Ref Range   Sodium 134 (L) 135 - 145 mmol/L   Potassium 4.2 3.5 - 5.1 mmol/L   Chloride 101 98 - 111 mmol/L   CO2 23 22 - 32 mmol/L   Glucose, Bld 222 (H) 70 - 99 mg/dL    Comment: Glucose reference range applies only to samples taken after fasting for at least 8 hours.   BUN 10 8 - 23 mg/dL   Creatinine, Ser 8.75 0.44 - 1.00 mg/dL   Calcium 8.8 (L) 8.9 - 10.3 mg/dL   GFR, Estimated >64 >33 mL/min    Comment: (NOTE) Calculated using the CKD-EPI Creatinine Equation (2021)    Anion gap 10 5 - 15    Comment: Performed at [facility], [address], [place], [place] [address]  Glucose, capillary     Status: Abnormal   Collection Time: 12/16/22  8:52 AM  Result Value Ref Range   Glucose-Capillary 160 (H) 70 - 99 mg/dL    Comment: Glucose reference range applies only to samples  taken after fasting for at least 8 hours.  Glucose, capillary     Status: Abnormal   Collection Time: 12/16/22  9:54 AM  Result Value Ref Range   Glucose-Capillary 176 (H) 70 - 99 mg/dL    Comment: Glucose reference range applies only to samples taken after  fasting for at least 8 hours.  Surgical pathology     Status: None   Collection Time: 12/16/22  9:55 AM  Result Value Ref Range   SURGICAL PATHOLOGY      SURGICAL PATHOLOGY CASE: [id number] PATIENT: [facility] Surgical Pathology Report     Clinical History: IDA, GERD, rectal bleeding, positive Cologuard, B12 deficiency (crm)     FINAL MICROSCOPIC DIAGNOSIS:  A. STOMACH, BIOPSY: Mild chronic gastritis with lymphoid aggregate Negative for H. pylori, intestinal metaplasia, dysplasia and carcinoma  B. ASCENDING COLON, POLYPECTOMY: Tubular adenoma Negative for high-grade dysplasia and carcinoma  C. 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  Focally this epithelium shows a cribriform architecture and increased cytologic atypia.  Also within the lamina propria there is focal reactive/desmoplastic stroma. Although no definitive invasion is seen, the desmopla stic stroma is worrisome for an invasive process.  As the specimen is described as a tumor, these fragments may not be entirely representative.  Clinical and endoscopic correlation is recommended with additional sampling as indicated.  Case is reviewed by Dr. Luisa Hart who concurs with the diagnosis.   GROSS DESCRIPTION:  A.  Received in formalin is a tan, soft tissue fragment that is submitted in toto.  Size: 0.2 x 0.2 x 0.1 cm, 1 block submitted.  B.  Received in formalin are multiple tan-brown soft tissue fragments measuring 1.6 x 1.0 x 0.2 cm in aggregate.  The specimen is entirely submitted in 1 block.  C.  Received in formalin are multiple pink-tan, nodular soft tissue fragments ranging from 0.1 to 0.5 cm.  The specimen is entirely submitted in 1  block. (KW, 12/16/2022)   Final Diagnosis performed by Jerene Bears, MD.   Electronically signed 12/18/2022 Technical component performed at [facility], [address], [place], [place]  [address].  Professional component performed at [employer], [address], [place], [place] [address].  Immunohistochemistry Technical component (if applicable) was performed at [facility]. [address], [place], [place] [address].   IMMUNOHISTOCHEMISTRY DISCLAIMER (if applicable): Some of these immunohistochemical stains may have been developed and the performance characteristics determine by [facility]. Some may not have been cleared or approved by the U.S. Food and Drug Administration. The FDA has determined that such clearance or approval is not necessary. This test is used for clinical purposes. It should not be regarded as investigational or for research. This laboratory is certified under the Clinical Laboratory Improvement Amendments of 1988 (CLIA-88) as qualified to perform high complexity clinical laboratory testing.  The controls stained appropriately.   CBC with Differential/Platelet     Status: Abnormal   Collection Time: 12/16/22 10:52 AM  Result Value Ref Range   WBC 7.0 4.0 - 10.5 K/uL   RBC 4.07 3.87 - 5.11 MIL/uL   Hemoglobin 8.7 (L) 12.0 - 15.0 g/dL    Comment: Reticulocyte Hemoglobin testing may be clinically indicated, consider ordering this additional test [id number]  HCT 31.4 (L) 36.0 - 46.0 %   MCV 77.1 (L) 80.0 - 100.0 fL   MCH 21.4 (L) 26.0 - 34.0 pg   MCHC 27.7 (L) 30.0 - 36.0 g/dL   RDW 16.1 (H) 09.6 - 04.5 %   Platelets 207 150 - 400 K/uL   nRBC 0.0 0.0 - 0.2 %   Neutrophils Relative % 66 %   Neutro Abs 4.6 1.7 - 7.7 K/uL   Lymphocytes Relative 19 %   Lymphs Abs 1.4 0.7 - 4.0 K/uL   Monocytes Relative 10 %   Monocytes Absolute 0.7 0.1 - 1.0 K/uL   Eosinophils Relative 3 %    Eosinophils Absolute 0.2 0.0 - 0.5 K/uL   Basophils Relative 1 %   Basophils Absolute 0.1 0.0 - 0.1 K/uL   Immature Granulocytes 1 %   Abs Immature Granulocytes 0.06 0.00 - 0.07 K/uL    Comment: Performed at [facility], [address] [place], [place] [address]  CEA     Status: None   Collection Time: 12/16/22 10:52 AM  Result Value Ref Range   CEA 3.8 0.0 - 4.7 ng/mL    Comment: (NOTE)                             Nonsmokers          <3.9                             Smokers             <5.6 [employer] Electrochemiluminescence Immunoassay (ECLIA) Values obtained with different assay methods or kits cannot be used interchangeably.  Results cannot be interpreted as absolute evidence of the presence or absence of malignant disease. Performed At: [facility] [address] [place], [place] [id number] Jolene Schimke MD [id number]   Comprehensive metabolic panel     Status: Abnormal   Collection Time: 12/16/22 10:52 AM  Result Value Ref Range   Sodium 135 135 - 145 mmol/L   Potassium 3.7 3.5 - 5.1 mmol/L   Chloride 101 98 - 111 mmol/L   CO2 26 22 - 32 mmol/L   Glucose, Bld 169 (H) 70 - 99 mg/dL    Comment: Glucose reference range applies only to samples taken after fasting for at least 8 hours.   BUN 7 (L) 8 - 23 mg/dL   Creatinine, Ser 9.62 0.44 - 1.00 mg/dL   Calcium 8.3 (L) 8.9 - 10.3 mg/dL   Total Protein 6.4 (L) 6.5 - 8.1 g/dL   Albumin 3.2 (L) 3.5 - 5.0 g/dL   AST 41 15 - 41 U/L   ALT 16 0 - 44 U/L   Alkaline Phosphatase 42 38 - 126 U/L   Total Bilirubin 0.7 0.3 - 1.2 mg/dL   GFR, Estimated >95 >28 mL/min    Comment: (NOTE) Calculated using the CKD-EPI Creatinine Equation (2021)    Anion gap 8 5 - 15    Comment: Performed at [facility], [address] [place], [place] [address]  HgB A1c     Status: Abnormal   Collection Time: 12/24/22  9:45 AM  Result Value Ref Range   Hemoglobin A1C 6.6 (A) 4.0 - 5.6 %   HbA1c POC (<> result, manual entry)      HbA1c, POC (prediabetic range)     HbA1c, POC (controlled diabetic range)    Reticulocytes  Status: Abnormal   Collection Time: 01/04/23  2:31 PM  Result Value Ref Range   Retic Ct Pct 2.9 0.4 - 3.1 %   RBC. 3.39 (L) 3.87 - 5.11 MIL/uL   Retic Count, Absolute 99.7 19.0 - 186.0 K/uL   Immature Retic Fract 31.1 (H) 2.3 - 15.9 %    Comment: Performed at [facility], [address], [place], [place] [address]  Lactate dehydrogenase     Status: None   Collection Time: 01/04/23  2:31 PM  Result Value Ref Range   LDH 136 98 - 192 U/L    Comment: Performed at [facility], [address] [place], [place] [address]  Iron and TIBC ([facility] ONLY)     Status: Abnormal   Collection Time: 01/04/23  2:31 PM  Result Value Ref Range   Iron 16 (L) 28 - 170 ug/dL   TIBC 102 725 - 366 ug/dL   Saturation Ratios 4 (L) 10.4 - 31.8 %   UIBC 354 ug/dL    Comment: Performed at [facility], [address] [place], [place] [address]  Ferritin     Status: Abnormal   Collection Time: 01/04/23  2:31 PM  Result Value Ref Range   Ferritin 4 (L) 11 - 307 ng/mL    Comment: Performed at [facility], [address] [place], [place] [address]  Vitamin B12     Status: None   Collection Time: 01/04/23  2:31 PM  Result Value Ref Range   Vitamin B-12 904 180 - 914 pg/mL    Comment: (NOTE) This assay is not validated for testing neonatal or myeloproliferative syndrome specimens for Vitamin B12 levels. Performed at [facility], [address] [place], [place] [address]   Folate     Status: Abnormal   Collection Time: 01/04/23  2:31 PM  Result Value Ref Range   Folate 3.7 (L) >5.9 ng/mL    Comment: Performed at [facility], [address] [place], [place] [address]  Methylmalonic acid, serum     Status: None   Collection Time: 01/04/23  2:31 PM  Result Value Ref Range   Methylmalonic Acid, Quantitative 222 0 - 378 nmol/L    Comment: (NOTE) This test was developed and its performance  characteristics determined by [facility]. It has not been cleared or approved by the Food and Drug Administration. Performed At: [facility] [address] [place], [place] [id number] Jolene Schimke MD SA:[id number]   Copper, serum     Status: None   Collection Time: 01/04/23  2:31 PM  Result Value Ref Range   Copper 129 80 - 158 ug/dL    Comment: (NOTE) This test was developed and its performance characteristics determined by [facility]. It has not been cleared or approved by the Food and Drug Administration.                                Detection Limit = 5 Performed At: [facility] [address] [place], [place] [id number] Jolene Schimke MD UR:[id number]   Glucose, capillary     Status: Abnormal   Collection Time: 01/15/23 12:27 PM  Result Value Ref Range   Glucose-Capillary 132 (H) 70 - 99 mg/dL    Comment: Glucose reference range applies only to samples taken after fasting for at least 8 hours.   Comment 1 Notify RN    Comment 2 Document in Chart   Glucose, capillary  Status: Abnormal   Collection Time: 01/15/23  3:24 PM  Result Value Ref Range   Glucose-Capillary 113 (H) 70 - 99 mg/dL    Comment: Glucose reference range applies only to samples taken after fasting for at least 8 hours.  CBC with Differential     Status: Abnormal   Collection Time: 01/20/23  9:18 AM  Result Value Ref Range   WBC 6.3 4.0 - 10.5 K/uL   RBC 3.57 (L) 3.87 - 5.11 MIL/uL   Hemoglobin 7.4 (L) 12.0 - 15.0 g/dL   HCT 16.1 (L) 09.6 - 04.5 %   MCV 78.4 (L) 80.0 - 100.0 fL   MCH 20.7 (L) 26.0 - 34.0 pg   MCHC 26.4 (L) 30.0 - 36.0 g/dL   RDW 40.9 (H) 81.1 - 91.4 %   Platelets 226 150 - 400 K/uL   nRBC 0.0 0.0 - 0.2 %   Neutrophils Relative % 66 %   Neutro Abs 4.2 1.7 - 7.7 K/uL   Lymphocytes Relative 21 %   Lymphs Abs 1.3 0.7 - 4.0 K/uL   Monocytes Relative 9 %   Monocytes Absolute 0.6 0.1 - 1.0 K/uL   Eosinophils Relative 2 %   Eosinophils Absolute 0.2 0.0 - 0.5 K/uL    Basophils Relative 1 %   Basophils Absolute 0.1 0.0 - 0.1 K/uL   Immature Granulocytes 1 %   Abs Immature Granulocytes 0.04 0.00 - 0.07 K/uL    Comment: Performed at [facility], [address], [place], [place] [address]  Comprehensive metabolic panel     Status: Abnormal   Collection Time: 01/20/23  9:18 AM  Result Value Ref Range   Sodium 136 135 - 145 mmol/L   Potassium 3.9 3.5 - 5.1 mmol/L   Chloride 105 98 - 111 mmol/L   CO2 23 22 - 32 mmol/L   Glucose, Bld 136 (H) 70 - 99 mg/dL    Comment: Glucose reference range applies only to samples taken after fasting for at least 8 hours.   BUN 10 8 - 23 mg/dL   Creatinine, Ser 6.21 0.44 - 1.00 mg/dL   Calcium 8.4 (L) 8.9 - 10.3 mg/dL   Total Protein 6.5 6.5 - 8.1 g/dL   Albumin 3.2 (L) 3.5 - 5.0 g/dL   AST 36 15 - 41 U/L   ALT 13 0 - 44 U/L   Alkaline Phosphatase 49 38 - 126 U/L   Total Bilirubin 0.6 0.3 - 1.2 mg/dL   GFR, Estimated >30 >86 mL/min    Comment: (NOTE) Calculated using the CKD-EPI Creatinine Equation (2021)    Anion gap 8 5 - 15    Comment: Performed at [facility], [address] [place], [place] [address]  Magnesium     Status: None   Collection Time: 01/20/23  9:18 AM  Result Value Ref Range   Magnesium 1.7 1.7 - 2.4 mg/dL    Comment: Performed at [facility], [address], [place], [place] [address]  CBC     Status: Abnormal   Collection Time: 01/26/23  3:03 PM  Result Value Ref Range   WBC 3.9 3.8 - 10.8 Thousand/uL   RBC 2.84 (L) 3.80 - 5.10 Million/uL   Hemoglobin 5.8 (LL) 11.7 - 15.5 g/dL    Comment: Verified by repeat analysis. Marland Kitchen    HCT 20.7 (L) 35.0 - 45.0 %    Comment: Verified by repeat analysis. Marland Kitchen    MCV 72.9 (L) 80.0 - 100.0 fL   MCH 20.4 (L)  27.0 - 33.0 pg   MCHC 28.0 (L) 32.0 - 36.0 g/dL   RDW 69.6 (H) 29.5 - 28.4 %   Platelets 209 140 - 400 Thousand/uL   MPV 9.9 7.5 - 12.5 fL  Comprehensive Metabolic Panel (CMET)     Status: Abnormal   Collection Time: 01/26/23  3:03 PM  Result  Value Ref Range   Glucose, Bld 156 (H) 65 - 99 mg/dL    Comment: .            Fasting reference interval . For someone without known diabetes, a glucose value >125 mg/dL indicates that they may have diabetes and this should be confirmed with a follow-up test. .    BUN 13 7 - 25 mg/dL   Creat 1.32 4.40 - 1.02 mg/dL   BUN/Creatinine Ratio SEE NOTE: 6 - 22 (calc)    Comment:    Not Reported: BUN and Creatinine are within    reference range. .    Sodium 136 135 - 146 mmol/L   Potassium 4.5 3.5 - 5.3 mmol/L   Chloride 104 98 - 110 mmol/L   CO2 24 20 - 32 mmol/L   Calcium 8.6 8.6 - 10.4 mg/dL   Total Protein 6.2 6.1 - 8.1 g/dL   Albumin 3.6 3.6 - 5.1 g/dL   Globulin 2.6 1.9 - 3.7 g/dL (calc)   AG Ratio 1.4 1.0 - 2.5 (calc)   Total Bilirubin 0.8 0.2 - 1.2 mg/dL   Alkaline phosphatase (APISO) 48 37 - 153 U/L   AST 40 (H) 10 - 35 U/L   ALT 21 6 - 29 U/L  Hepatitis A antibody, IgM     Status: None   Collection Time: 01/26/23  3:03 PM  Result Value Ref Range   Hep A IgM NON-REACTIVE NON-REACTIVE    Comment: . For additional information, please refer to  http://education.[facility].com/faq/FAQ202  (This link is being provided for informational/ educational purposes only.) .   Hepatitis B Surface AntiGEN     Status: None   Collection Time: 01/26/23  3:03 PM  Result Value Ref Range   Hepatitis B Surface Ag NON-REACTIVE NON-REACTIVE    Comment: . For additional information, please refer to  http://education.[facility].com/faq/FAQ202  (This link is being provided for informational/ educational purposes only.) .   Hepatitis C Antibody     Status: None   Collection Time: 01/26/23  3:03 PM  Result Value Ref Range   Hepatitis C Ab NON-REACTIVE NON-REACTIVE    Comment: . HCV antibody was non-reactive. There is no laboratory  evidence of HCV infection. . In most cases, no further action is required. However, if recent HCV exposure is suspected, a test for HCV  RNA (test code 72536) is suggested. . For additional information please refer to http://education.[facility].com/faq/FAQ22v1 (This link is being provided for informational/ educational purposes only.) .   CBC with Differential     Status: Abnormal   Collection Time: 01/27/23 10:37 AM  Result Value Ref Range   WBC 1.3 (LL) 4.0 - 10.5 K/uL    Comment: This critical result has verified and been called to B PRESNELL RN by Dorene Ar on 03 06 2024 at 1139, and has been read back.    RBC 2.31 (L) 3.87 - 5.11 MIL/uL   Hemoglobin 4.8 (LL) 12.0 - 15.0 g/dL    Comment: Reticulocyte Hemoglobin testing may be clinically indicated, consider ordering this additional test [id number] THIS CRITICAL RESULT HAS VERIFIED AND BEEN CALLED TO B PRESNELL RN BY Dorene Ar  ON 03 06 2024 AT 1139, AND HAS BEEN READ BACK.     HCT 17.7 (L) 36.0 - 46.0 %   MCV 76.6 (L) 80.0 - 100.0 fL   MCH 20.8 (L) 26.0 - 34.0 pg   MCHC 27.1 (L) 30.0 - 36.0 g/dL   RDW 16.1 (H) 09.6 - 04.5 %   Platelets 115 (L) 150 - 400 K/uL   nRBC 0.0 0.0 - 0.2 %   Neutrophils Relative % 51 %   Neutro Abs 0.7 (L) 1.7 - 7.7 K/uL   Lymphocytes Relative 43 %   Lymphs Abs 0.6 (L) 0.7 - 4.0 K/uL   Monocytes Relative 2 %   Monocytes Absolute 0.0 (L) 0.1 - 1.0 K/uL   Eosinophils Relative 2 %   Eosinophils Absolute 0.0 0.0 - 0.5 K/uL   Basophils Relative 0 %   Basophils Absolute 0.0 0.0 - 0.1 K/uL   Smear Review MORPHOLOGY UNREMARKABLE    Immature Granulocytes 2 %   Abs Immature Granulocytes 0.02 0.00 - 0.07 K/uL   Reactive, Benign Lymphocytes PRESENT    Tear Drop Cells PRESENT    Ovalocytes PRESENT     Comment: Performed at [facility], [address] [place], [place] [address]  Comprehensive metabolic panel     Status: Abnormal   Collection Time: 01/27/23 10:37 AM  Result Value Ref Range   Sodium 135 135 - 145 mmol/L   Potassium 4.0 3.5 - 5.1 mmol/L   Chloride 104 98 - 111 mmol/L   CO2 21 (L) 22 - 32 mmol/L    Glucose, Bld 199 (H) 70 - 99 mg/dL    Comment: Glucose reference range applies only to samples taken after fasting for at least 8 hours.   BUN 14 8 - 23 mg/dL   Creatinine, Ser 1.91 0.44 - 1.00 mg/dL   Calcium 8.4 (L) 8.9 - 10.3 mg/dL   Total Protein 6.0 (L) 6.5 - 8.1 g/dL   Albumin 3.1 (L) 3.5 - 5.0 g/dL   AST 44 (H) 15 - 41 U/L   ALT 23 0 - 44 U/L   Alkaline Phosphatase 43 38 - 126 U/L   Total Bilirubin 0.9 0.3 - 1.2 mg/dL   GFR, Estimated >47 >82 mL/min    Comment: (NOTE) Calculated using the CKD-EPI Creatinine Equation (2021)    Anion gap 10 5 - 15    Comment: Performed at [facility], [address] [place], [place] [address]  Magnesium     Status: None   Collection Time: 01/27/23 10:37 AM  Result Value Ref Range   Magnesium 1.8 1.7 - 2.4 mg/dL    Comment: Performed at [facility], [address], [place], [place] [address]  Type and screen     Status: None   Collection Time: 01/27/23 10:37 AM  Result Value Ref Range   ABO/RH(D) A POS    Antibody Screen NEG    Sample Expiration 01/30/2023,2359    Unit Number [id number]    Blood Component Type RED CELLS,LR    Unit division 00    Status of Unit ISSUED,FINAL    Transfusion Status OK TO TRANSFUSE    Crossmatch Result Compatible    Unit Number [id number]    Blood Component Type RED CELLS,LR    Unit division 00    Status of Unit ISSUED,FINAL    Transfusion Status OK TO TRANSFUSE    Crossmatch Result Compatible    Unit Number [id number]    Blood Component Type RED CELLS,LR  Unit division 00    Status of Unit ISSUED,FINAL    Transfusion Status OK TO TRANSFUSE    Crossmatch Result Compatible    Unit Number [id number]    Blood Component Type RED CELLS,LR    Unit division 00    Status of Unit ISSUED,FINAL    Transfusion Status OK TO TRANSFUSE    Crossmatch Result Compatible    Unit Number [id number]    Blood Component Type RED CELLS,LR    Unit division 00    Status of Unit REL FROM [facility]     Transfusion Status OK TO TRANSFUSE    Crossmatch Result      Compatible Performed at [facility], [address], [place], [place] [address]   Prepare RBC (crossmatch)     Status: None   Collection Time: 01/27/23 10:37 AM  Result Value Ref Range   Order Confirmation      ORDER PROCESSED BY BLOOD BANK Performed at [facility], [address] [place] [address]   BPAM RBC     Status: None   Collection Time: 01/27/23 10:37 AM  Result Value Ref Range   ISSUE DATE / TIME 485462703500    Blood Product Unit Number [id number]    PRODUCT CODE [id number]    Unit Type and Rh 6200    Blood Product Expiration Date 175102585277    ISSUE DATE / TIME 824235361443    Blood Product Unit Number [id number]    PRODUCT CODE [id number]    Unit Type and Rh 6200    Blood Product Expiration Date 580998338250    ISSUE DATE / TIME 539767341937    Blood Product Unit Number [id number]    PRODUCT CODE [id number]    Unit Type and Rh 6200    Blood Product Expiration Date 962229798921    ISSUE DATE / TIME 194174081448    Blood Product Unit Number [id number]    PRODUCT CODE [id number]    Unit Type and Rh 6200    Blood Product Expiration Date 774128786767    Blood Product Unit Number [id number]    PRODUCT CODE [id number]    Unit Type and Rh 6200    Blood Product Expiration Date 503546568127   Prepare RBC (crossmatch)     Status: None   Collection Time: 01/27/23 10:37 AM  Result Value Ref Range   Order Confirmation      ORDER PROCESSED BY BLOOD BANK Performed at [facility], [address], [place], [place] [address]   ABO/Rh     Status: None   Collection Time: 01/27/23 12:00 PM  Result Value Ref Range   ABO/RH(D)      A POS Performed at [facility], [address] [place], [place] [address]   Prepare RBC (crossmatch)     Status: None   Collection Time: 01/27/23  4:30 PM  Result Value Ref Range   Order Confirmation      ORDER PROCESSED BY BLOOD BANK Performed at [facility], [address], [place], [place] [address]   Glucose, capillary     Status: Abnormal   Collection Time: 01/27/23  5:34 PM  Result Value Ref Range   Glucose-Capillary 162 (H) 70 - 99 mg/dL    Comment: Glucose reference range applies only to samples taken after fasting for at least 8 hours.  MRSA Next Gen by PCR, Nasal     Status: None   Collection Time: 01/27/23  6:00 PM   Specimen: Nasal Mucosa; Nasal Swab  Result Value Ref Range  MRSA by PCR Next Gen NOT DETECTED NOT DETECTED    Comment: (NOTE) The GeneXpert MRSA Assay (FDA approved for NASAL specimens only), is one component of a comprehensive MRSA colonization surveillance program. It is not intended to diagnose MRSA infection nor to guide or monitor treatment for MRSA infections. Test performance is not FDA approved in patients less than 1 years old. Performed at [facility], [address] [place], [place] [address]   HIV Antibody (routine testing w rflx)     Status: None   Collection Time: 01/27/23  7:32 PM  Result Value Ref Range   HIV Screen 4th Generation wRfx Non Reactive Non Reactive    Comment: Performed at [facility], [address], [place], [place] [address]  CBC     Status: Abnormal   Collection Time: 01/27/23  7:32 PM  Result Value Ref Range   WBC 1.1 (LL) 4.0 - 10.5 K/uL    Comment: CRITICAL VALUE NOTED.  VALUE IS CONSISTENT WITH PREVIOUSLY REPORTED AND CALLED VALUE. REPEATED TO VERIFY THIS CRITICAL RESULT HAS VERIFIED AND BEEN CALLED TO Dorris Carnes RONE, RN BY CHELSEA VARNER ON 03 06 2024 AT 1949, AND HAS BEEN READ BACK. CRITICAL RESULTS VERIFIED    RBC 2.80 (L) 3.87 - 5.11 MIL/uL   Hemoglobin 6.4 (LL) 12.0 - 15.0 g/dL    Comment: CRITICAL VALUE NOTED.  VALUE IS CONSISTENT WITH PREVIOUSLY REPORTED AND CALLED VALUE. REPEATED TO VERIFY POST TRANSFUSION SPECIMEN Reticulocyte Hemoglobin testing may be clinically indicated, consider ordering this additional test [id number]    HCT 21.7 (L) 36.0 - 46.0 %    MCV 77.5 (L) 80.0 - 100.0 fL   MCH 22.9 (L) 26.0 - 34.0 pg   MCHC 29.5 (L) 30.0 - 36.0 g/dL   RDW 66.4 (H) 40.3 - 47.4 %   Platelets 114 (L) 150 - 400 K/uL    Comment: Immature Platelet Fraction may be clinically indicated, consider ordering this additional test [id number]    nRBC 0.0 0.0 - 0.2 %    Comment: Performed at [facility], [address], [place], [place] [address]  Glucose, capillary     Status: Abnormal   Collection Time: 01/27/23  7:53 PM  Result Value Ref Range   Glucose-Capillary 136 (H) 70 - 99 mg/dL    Comment: Glucose reference range applies only to samples taken after fasting for at least 8 hours.  Glucose, capillary     Status: Abnormal   Collection Time: 01/27/23  8:55 PM  Result Value Ref Range   Glucose-Capillary 125 (H) 70 - 99 mg/dL    Comment: Glucose reference range applies only to samples taken after fasting for at least 8 hours.   Comment 1 Notify RN    Comment 2 Document in Chart   CBC     Status: Abnormal   Collection Time: 01/28/23  1:26 AM  Result Value Ref Range   WBC 0.8 (LL) 4.0 - 10.5 K/uL    Comment: CRITICAL VALUE NOTED.  VALUE IS CONSISTENT WITH PREVIOUSLY REPORTED AND CALLED VALUE. REPEATED TO VERIFY    RBC 2.86 (L) 3.87 - 5.11 MIL/uL   Hemoglobin 6.8 (LL) 12.0 - 15.0 g/dL    Comment: CRITICAL VALUE NOTED.  VALUE IS CONSISTENT WITH PREVIOUSLY REPORTED AND CALLED VALUE. REPEATED TO VERIFY    HCT 22.6 (L) 36.0 - 46.0 %   MCV 79.0 (L) 80.0 - 100.0 fL   MCH 23.8 (L) 26.0 - 34.0 pg   MCHC 30.1 30.0 - 36.0  g/dL   RDW 16.1 (H) 09.6 - 04.5 %   Platelets 99 (L) 150 - 400 K/uL    Comment: SPECIMEN CHECKED FOR CLOTS Immature Platelet Fraction may be clinically indicated, consider ordering this additional test [id number] CONSISTENT WITH PREVIOUS RESULT    nRBC 0.0 0.0 - 0.2 %    Comment: Performed at [facility], [address] [place], [place] [address]  Basic metabolic panel     Status: Abnormal   Collection Time: 01/28/23  1:26 AM   Result Value Ref Range   Sodium 134 (L) 135 - 145 mmol/L   Potassium 3.8 3.5 - 5.1 mmol/L   Chloride 105 98 - 111 mmol/L   CO2 22 22 - 32 mmol/L   Glucose, Bld 139 (H) 70 - 99 mg/dL    Comment: Glucose reference range applies only to samples taken after fasting for at least 8 hours.   BUN 14 8 - 23 mg/dL   Creatinine, Ser 5.62 0.44 - 1.00 mg/dL   Calcium 7.9 (L) 8.9 - 10.3 mg/dL   GFR, Estimated >13 >08 mL/min    Comment: (NOTE) Calculated using the CKD-EPI Creatinine Equation (2021)    Anion gap 7 5 - 15    Comment: Performed at [facility], [address], [place], [place] [address]  Glucose, capillary     Status: Abnormal   Collection Time: 01/28/23  7:27 AM  Result Value Ref Range   Glucose-Capillary 158 (H) 70 - 99 mg/dL    Comment: Glucose reference range applies only to samples taken after fasting for at least 8 hours.  Prepare RBC (crossmatch)     Status: None   Collection Time: 01/28/23  8:01 AM  Result Value Ref Range   Order Confirmation      ORDER PROCESSED BY BLOOD BANK Performed at [facility], [address] [place], [place] [address]   Glucose, capillary     Status: Abnormal   Collection Time: 01/28/23 11:34 AM  Result Value Ref Range   Glucose-Capillary 163 (H) 70 - 99 mg/dL    Comment: Glucose reference range applies only to samples taken after fasting for at least 8 hours.  Hemoglobin and hematocrit, blood     Status: Abnormal   Collection Time: 01/28/23  1:47 PM  Result Value Ref Range   Hemoglobin 8.1 (L) 12.0 - 15.0 g/dL   HCT 52.8 (L) 41.3 - 24.4 %    Comment: Performed at [facility], [address] [place], [place] [address]  CBC with Differential     Status: Abnormal   Collection Time: 01/29/23  8:58 AM  Result Value Ref Range   WBC 0.6 (LL) 4.0 - 10.5 K/uL    Comment: This critical result has verified and been called to PRESNELL, B by Yehuda Savannah on 03 08 2024 at 1002, and has been read back.    RBC 3.01 (L) 3.87 - 5.11 MIL/uL   Hemoglobin  7.6 (L) 12.0 - 15.0 g/dL   HCT 25.3 (L) 66.4 - 40.3 %   MCV 80.1 80.0 - 100.0 fL   MCH 25.2 (L) 26.0 - 34.0 pg   MCHC 31.5 30.0 - 36.0 g/dL   RDW 47.4 (H) 25.9 - 56.3 %   Platelets 83 (L) 150 - 400 K/uL    Comment: SPECIMEN CHECKED FOR CLOTS Immature Platelet Fraction may be clinically indicated, consider ordering this additional test [id number] PLATELET COUNT CONFIRMED BY SMEAR    nRBC 0.0 0.0 - 0.2 %   Neutrophils Relative %  6 %   Neutro Abs 0.0 (LL) 1.7 - 7.7 K/uL    Comment: This critical result has verified and been called to PRESNELL, B by Yehuda Savannah on 03 08 2024 at 1009, and has been read back.    Lymphocytes Relative 84 %   Lymphs Abs 0.5 (L) 0.7 - 4.0 K/uL   Monocytes Relative 4 %   Monocytes Absolute 0.0 (L) 0.1 - 1.0 K/uL   Eosinophils Relative 6 %   Eosinophils Absolute 0.0 0.0 - 0.5 K/uL   Basophils Relative 0 %   Basophils Absolute 0.0 0.0 - 0.1 K/uL   RBC Morphology MORPHOLOGY UNREMARKABLE    Smear Review PLATELET COUNT CONFIRMED BY SMEAR     Comment: See Note FEW LARGE PLATELETS OBSERVED ON SMEAR    Abs Immature Granulocytes 0.00 0.00 - 0.07 K/uL   Smudge Cells PRESENT     Comment: Performed at [facility], [address], [place], [place] [address]  Sample to Blood Bank(Blood Bank Hold)     Status: None   Collection Time: 01/29/23  8:58 AM  Result Value Ref Range   Blood Bank Specimen SAMPLE AVAILABLE FOR TESTING    Sample Expiration      02/01/2023,2359 Performed at [facility], [address] [place], [place] [address]   Type and screen     Status: None   Collection Time: 01/29/23 10:11 AM  Result Value Ref Range   ABO/RH(D) A POS    Antibody Screen NEG    Sample Expiration 02/01/2023,2359    Unit Number [id number]    Blood Component Type RBC LR PHER1    Unit division 00    Status of Unit ISSUED,FINAL    Transfusion Status OK TO TRANSFUSE    Crossmatch Result      Compatible Performed at [facility], [address] [place], [place]  [address]   BPAM RBC     Status: None   Collection Time: 01/29/23 10:11 AM  Result Value Ref Range   ISSUE DATE / TIME [id number]    Blood Product Unit Number [id number]    PRODUCT CODE [id number]    Unit Type and Rh 6200    Blood Product Expiration Date [id number]   Prepare RBC (crossmatch)     Status: None   Collection Time: 01/29/23 10:12 AM  Result Value Ref Range   Order Confirmation      ORDER PROCESSED BY BLOOD BANK Performed at [facility], [address] [place], [place] [address]   CBC with Differential     Status: Abnormal   Collection Time: 02/01/23  9:01 AM  Result Value Ref Range   WBC 1.3 (LL) 4.0 - 10.5 K/uL    Comment: REPEATED TO VERIFY WHITE COUNT CONFIRMED ON SMEAR THIS CRITICAL RESULT HAS VERIFIED AND BEEN CALLED TO R. BROWN BY TRENA MCCLAIN STEPHENS ON 03 11 2024 AT 1017, AND HAS BEEN READ BACK.     RBC 2.86 (L) 3.87 - 5.11 MIL/uL   Hemoglobin 7.3 (L) 12.0 - 15.0 g/dL   HCT 29.5 (L) 18.8 - 41.6 %   MCV 80.8 80.0 - 100.0 fL   MCH 25.5 (L) 26.0 - 34.0 pg   MCHC 31.6 30.0 - 36.0 g/dL   RDW 60.6 (H) 30.1 - 60.1 %   Platelets 112 (L) 150 - 400 K/uL    Comment: SPECIMEN CHECKED FOR CLOTS Immature Platelet Fraction may be clinically indicated, consider ordering this additional test [id number]    nRBC 0.0 0.0 - 0.2 %  Neutrophils Relative % 10 %   Neutro Abs 0.1 (LL) 1.7 - 7.7 K/uL    Comment: This critical result has verified and been called to R. BROWN by Julian Reil on 03 11 2024 at 1017, and has been read back.    Lymphocytes Relative 51 %   Lymphs Abs 0.7 0.7 - 4.0 K/uL   Monocytes Relative 35 %   Monocytes Absolute 0.5 0.1 - 1.0 K/uL   Eosinophils Relative 3 %   Eosinophils Absolute 0.0 0.0 - 0.5 K/uL   Basophils Relative 1 %   Basophils Absolute 0.0 0.0 - 0.1 K/uL   Smear Review MORPHOLOGY UNREMARKABLE    Immature Granulocytes 0 %   Abs Immature Granulocytes 0.00 0.00 - 0.07 K/uL   Reactive, Benign Lymphocytes PRESENT     Dimorphism PRESENT     Comment: Performed at [facility], [address] [place], [place] [address]  Sample to Blood Bank(Blood Bank Hold)     Status: None   Collection Time: 02/01/23  9:14 AM  Result Value Ref Range   Blood Bank Specimen SAMPLE AVAILABLE FOR TESTING    Sample Expiration      02/02/2023,2359 Performed at [facility] [address], [place], [place] [address]   Type and screen     Status: None   Collection Time: 02/01/23  9:19 AM  Result Value Ref Range   ABO/RH(D) A POS    Antibody Screen NEG    Sample Expiration 02/04/2023,2359    Unit Number [id number]    Blood Component Type RBC LR PHER1    Unit division 00    Status of Unit ISSUED,FINAL    Transfusion Status OK TO TRANSFUSE    Crossmatch Result      Compatible Performed at [facility], [address], [place], [place] [address]   BPAM RBC     Status: None   Collection Time: 02/01/23  9:19 AM  Result Value Ref Range   ISSUE DATE / TIME [id number]    Blood Product Unit Number [id number]    PRODUCT CODE [id number]    Unit Type and Rh 6200    Blood Product Expiration Date [id number]   Prepare RBC (crossmatch)     Status: None   Collection Time: 02/01/23 10:45 AM  Result Value Ref Range   Order Confirmation      ORDER PROCESSED BY BLOOD BANK Performed at [facility], [address] [place], [place] [address]   CBC with Differential     Status: Abnormal   Collection Time: 02/03/23  8:36 AM  Result Value Ref Range   WBC 12.6 (H) 4.0 - 10.5 K/uL   RBC 3.22 (L) 3.87 - 5.11 MIL/uL   Hemoglobin 8.6 (L) 12.0 - 15.0 g/dL   HCT 29.5 (L) 18.8 - 41.6 %   MCV 82.0 80.0 - 100.0 fL   MCH 26.7 26.0 - 34.0 pg   MCHC 32.6 30.0 - 36.0 g/dL   RDW 60.6 (H) 30.1 - 60.1 %   Platelets 230 150 - 400 K/uL    Comment: DELTA CHECK NOTED PT RECEIVED INJECTION FOR WBC BOOST PER PAGE, K.    nRBC 1.0 (H) 0.0 - 0.2 %   Neutrophils Relative % 69 %   Neutro Abs 8.5 (H) 1.7 - 7.7 K/uL   Lymphocytes Relative 15 %    Lymphs Abs 1.9 0.7 - 4.0 K/uL   Monocytes Relative 10 %   Monocytes Absolute 1.3 (H) 0.1 - 1.0 K/uL   Eosinophils  Relative 1 %   Eosinophils Absolute 0.2 0.0 - 0.5 K/uL   Basophils Relative 0 %   Basophils Absolute 0.0 0.0 - 0.1 K/uL   WBC Morphology TOXIC GRANULATION    RBC Morphology MORPHOLOGY UNREMARKABLE    Smear Review MORPHOLOGY UNREMARKABLE    Immature Granulocytes 5 %   Abs Immature Granulocytes 0.68 (H) 0.00 - 0.07 K/uL    Comment: Performed at [facility], [address], [place], [place] [address]  Comprehensive metabolic panel     Status: Abnormal   Collection Time: 02/03/23  8:36 AM  Result Value Ref Range   Sodium 135 135 - 145 mmol/L   Potassium 2.8 (L) 3.5 - 5.1 mmol/L   Chloride 103 98 - 111 mmol/L   CO2 21 (L) 22 - 32 mmol/L   Glucose, Bld 139 (H) 70 - 99 mg/dL    Comment: Glucose reference range applies only to samples taken after fasting for at least 8 hours.   BUN 15 8 - 23 mg/dL   Creatinine, Ser 1.19 (H) 0.44 - 1.00 mg/dL   Calcium 8.3 (L) 8.9 - 10.3 mg/dL   Total Protein 6.0 (L) 6.5 - 8.1 g/dL   Albumin 2.5 (L) 3.5 - 5.0 g/dL   AST 23 15 - 41 U/L   ALT 17 0 - 44 U/L   Alkaline Phosphatase 58 38 - 126 U/L   Total Bilirubin 1.0 0.3 - 1.2 mg/dL   GFR, Estimated 35 (L) >60 mL/min    Comment: (NOTE) Calculated using the CKD-EPI Creatinine Equation (2021)    Anion gap 11 5 - 15    Comment: Performed at [facility], [address], [place], [place] [address]  Magnesium     Status: Abnormal   Collection Time: 02/03/23  8:36 AM  Result Value Ref Range   Magnesium 1.4 (L) 1.7 - 2.4 mg/dL    Comment: Performed at [facility], [address], [place], [place] [address]  Sample to Blood Bank(Blood Bank Hold)     Status: None   Collection Time: 02/08/23 10:54 AM  Result Value Ref Range   Blood Bank Specimen SAMPLE AVAILABLE FOR TESTING    Sample Expiration      02/09/2023,2359 Performed at [facility], [address], [place], [place] [address]   CBC with  Differential     Status: Abnormal   Collection Time: 02/08/23 10:54 AM  Result Value Ref Range   WBC 1.5 (L) 4.0 - 10.5 K/uL   RBC 3.03 (L) 3.87 - 5.11 MIL/uL   Hemoglobin 8.0 (L) 12.0 - 15.0 g/dL   HCT 78.4 (L) 69.6 - 29.5 %   MCV 83.2 80.0 - 100.0 fL   MCH 26.4 26.0 - 34.0 pg   MCHC 31.7 30.0 - 36.0 g/dL   RDW 28.4 (H) 13.2 - 44.0 %   Platelets 164 150 - 400 K/uL   nRBC 0.0 0.0 - 0.2 %   Neutrophils Relative % 36 %   Neutro Abs 0.6 (L) 1.7 - 7.7 K/uL   Band Neutrophils 2 %   Lymphocytes Relative 57 %   Lymphs Abs 0.9 0.7 - 4.0 K/uL   Monocytes Relative 1 %   Monocytes Absolute 0.0 (L) 0.1 - 1.0 K/uL   Eosinophils Relative 0 %   Eosinophils Absolute 0.0 0.0 - 0.5 K/uL   Basophils Relative 0 %   Basophils Absolute 0.0 0.0 - 0.1 K/uL   WBC Morphology MILD LEFT SHIFT (1-5% METAS, OCC MYELO, OCC BANDS)  Comment: TOXIC GRANULATION   RBC Morphology See Note     Comment: ANISOCYTOSIS   Smear Review MORPHOLOGY UNREMARKABLE    Metamyelocytes Relative 3 %   Myelocytes 1 %   Abs Immature Granulocytes 0.10 (H) 0.00 - 0.07 K/uL   Reactive, Benign Lymphocytes PRESENT     Comment: Performed at [facility], [address] [place], [place] [address]  CBC with Differential     Status: Abnormal   Collection Time: 02/11/23  9:36 AM  Result Value Ref Range   WBC 1.2 (LL) 4.0 - 10.5 K/uL    Comment: REPEATED TO VERIFY THIS CRITICAL RESULT HAS VERIFIED AND BEEN CALLED TO PRESNELL, B. BY ASHLEY FRATTO ON 03 21 2024 AT 1028, AND HAS BEEN READ BACK.     RBC 2.91 (L) 3.87 - 5.11 MIL/uL   Hemoglobin 7.5 (L) 12.0 - 15.0 g/dL   HCT 52.8 (L) 41.3 - 24.4 %   MCV 81.4 80.0 - 100.0 fL   MCH 25.8 (L) 26.0 - 34.0 pg   MCHC 31.6 30.0 - 36.0 g/dL   RDW 01.0 (H) 27.2 - 53.6 %   Platelets 125 (L) 150 - 400 K/uL   nRBC 0.0 0.0 - 0.2 %   Neutrophils Relative % 46 %   Neutro Abs 0.6 (L) 1.7 - 7.7 K/uL   Band Neutrophils 4 %   Lymphocytes Relative 43 %   Lymphs Abs 0.5 (L) 0.7 - 4.0 K/uL   Monocytes  Relative 7 %   Monocytes Absolute 0.1 0.1 - 1.0 K/uL   Eosinophils Relative 0 %   Eosinophils Absolute 0.0 0.0 - 0.5 K/uL   Basophils Relative 0 %   Basophils Absolute 0.0 0.0 - 0.1 K/uL   WBC Morphology DOHLE BODIES     Comment: TOXIC GRANULATION   RBC Morphology MORPHOLOGY UNREMARKABLE    Smear Review MORPHOLOGY UNREMARKABLE    Abs Immature Granulocytes 0.00 0.00 - 0.07 K/uL   Reactive, Benign Lymphocytes PRESENT    Dohle Bodies PRESENT     Comment: Performed at [facility], [address], [place], [place] [address]  Comprehensive metabolic panel     Status: Abnormal   Collection Time: 02/11/23  9:36 AM  Result Value Ref Range   Sodium 138 135 - 145 mmol/L   Potassium 2.6 (LL) 3.5 - 5.1 mmol/L    Comment: CRITICAL RESULT CALLED TO, READ BACK BY AND VERIFIED WITH B PRESNELL AT 1036 ON 02/11/2023 BY S DALTON   Chloride 104 98 - 111 mmol/L   CO2 24 22 - 32 mmol/L   Glucose, Bld 127 (H) 70 - 99 mg/dL    Comment: Glucose reference range applies only to samples taken after fasting for at least 8 hours.   BUN 9 8 - 23 mg/dL   Creatinine, Ser 4.74 0.44 - 1.00 mg/dL   Calcium 8.1 (L) 8.9 - 10.3 mg/dL   Total Protein 6.1 (L) 6.5 - 8.1 g/dL   Albumin 3.0 (L) 3.5 - 5.0 g/dL   AST 23 15 - 41 U/L   ALT 19 0 - 44 U/L   Alkaline Phosphatase 67 38 - 126 U/L   Total Bilirubin 0.9 0.3 - 1.2 mg/dL   GFR, Estimated >25 >95 mL/min    Comment: (NOTE) Calculated using the CKD-EPI Creatinine Equation (2021)    Anion gap 10 5 - 15    Comment: Performed at [facility], [address], [place], [place] [address]  Magnesium     Status: Abnormal  Collection Time: 02/11/23  9:36 AM  Result Value Ref Range   Magnesium 1.3 (L) 1.7 - 2.4 mg/dL    Comment: Performed at [facility], [address] [place], [place] [address]  Type and screen     Status: None   Collection Time: 02/11/23 11:57 AM  Result Value Ref Range   ABO/RH(D) A POS    Antibody Screen NEG    Sample Expiration 02/14/2023,2359     Unit Number [id number]    Blood Component Type RED CELLS,LR    Unit division 00    Status of Unit ISSUED,FINAL    Transfusion Status OK TO TRANSFUSE    Crossmatch Result      Compatible Performed at [facility], [address], [place], [place] [address]   Prepare RBC (crossmatch)     Status: None   Collection Time: 02/11/23 11:57 AM  Result Value Ref Range   Order Confirmation      ORDER PROCESSED BY BLOOD BANK Performed at [facility], [address], [place], [place] [address]   BPAM RBC     Status: None   Collection Time: 02/11/23 11:57 AM  Result Value Ref Range   ISSUE DATE / TIME [id number]    Blood Product Unit Number [id number]    Unit Type and Rh 0600    Blood Product Expiration Date [id number]   CBC with Differential     Status: Abnormal   Collection Time: 02/12/23  9:58 AM  Result Value Ref Range   WBC 1.7 (L) 4.0 - 10.5 K/uL   RBC 3.10 (L) 3.87 - 5.11 MIL/uL   Hemoglobin 8.3 (L) 12.0 - 15.0 g/dL   HCT 64.3 (L) 32.9 - 51.8 %   MCV 81.3 80.0 - 100.0 fL   MCH 26.8 26.0 - 34.0 pg   MCHC 32.9 30.0 - 36.0 g/dL   RDW 84.1 (H) 66.0 - 63.0 %   Platelets 154 150 - 400 K/uL   nRBC 0.0 0.0 - 0.2 %   Neutrophils Relative % 24 %   Neutro Abs 0.5 (L) 1.7 - 7.7 K/uL   Band Neutrophils 6 %   Lymphocytes Relative 61 %   Lymphs Abs 1.0 0.7 - 4.0 K/uL   Monocytes Relative 8 %   Monocytes Absolute 0.1 0.1 - 1.0 K/uL   Eosinophils Relative 0 %   Eosinophils Absolute 0.0 0.0 - 0.5 K/uL   Basophils Relative 1 %   Basophils Absolute 0.0 0.0 - 0.1 K/uL   WBC Morphology DOHLE BODIES     Comment: TOXIC GRANULATION   RBC Morphology MORPHOLOGY UNREMARKABLE    Smear Review MORPHOLOGY UNREMARKABLE    nRBC 2 (H) 0 /100 WBC   Abs Immature Granulocytes 0.00 0.00 - 0.07 K/uL   Reactive, Benign Lymphocytes PRESENT    Dohle Bodies PRESENT     Comment: Performed at [facility], [address] [place], [place] [address]  Comprehensive metabolic panel     Status: Abnormal    Collection Time: 02/12/23  9:58 AM  Result Value Ref Range   Sodium 135 135 - 145 mmol/L   Potassium 2.5 (LL) 3.5 - 5.1 mmol/L    Comment: CRITICAL RESULT CALLED TO, READ BACK BY AND VERIFIED WITH PRESNELL,B AT 10:25AM ON 02/12/23 BY FESTERMAN,C   Chloride 104 98 - 111 mmol/L   CO2 24 22 - 32 mmol/L   Glucose, Bld 87 70 - 99 mg/dL    Comment: Glucose reference range applies only to samples taken after fasting for  at least 8 hours.   BUN 7 (L) 8 - 23 mg/dL   Creatinine, Ser 1.61 0.44 - 1.00 mg/dL   Calcium 7.9 (L) 8.9 - 10.3 mg/dL   Total Protein 6.0 (L) 6.5 - 8.1 g/dL   Albumin 2.9 (L) 3.5 - 5.0 g/dL   AST 19 15 - 41 U/L   ALT 17 0 - 44 U/L   Alkaline Phosphatase 66 38 - 126 U/L   Total Bilirubin 0.8 0.3 - 1.2 mg/dL   GFR, Estimated >09 >60 mL/min    Comment: (NOTE) Calculated using the CKD-EPI Creatinine Equation (2021)    Anion gap 7 5 - 15    Comment: Performed at [facility], [address] [place], [place] [address]  Magnesium     Status: None   Collection Time: 02/12/23  9:58 AM  Result Value Ref Range   Magnesium 1.8 1.7 - 2.4 mg/dL    Comment: Performed at [facility], [address], [place], [place] [address]  CBC with Differential     Status: Abnormal   Collection Time: 02/15/23  8:07 AM  Result Value Ref Range   WBC 5.6 4.0 - 10.5 K/uL   RBC 3.30 (L) 3.87 - 5.11 MIL/uL   Hemoglobin 8.8 (L) 12.0 - 15.0 g/dL   HCT 47.8 (L) 29.5 - 62.1 %   MCV 82.1 80.0 - 100.0 fL   MCH 26.7 26.0 - 34.0 pg   MCHC 32.5 30.0 - 36.0 g/dL   RDW 30.8 (H) 65.7 - 84.6 %   Platelets 169 150 - 400 K/uL   nRBC 1.2 (H) 0.0 - 0.2 %   Neutrophils Relative % 49 %   Neutro Abs 2.9 1.7 - 7.7 K/uL   Band Neutrophils 2 %   Lymphocytes Relative 37 %   Lymphs Abs 2.1 0.7 - 4.0 K/uL   Monocytes Relative 6 %   Monocytes Absolute 0.3 0.1 - 1.0 K/uL   Eosinophils Relative 1 %   Eosinophils Absolute 0.1 0.0 - 0.5 K/uL   Basophils Relative 0 %   Basophils Absolute 0.0 0.0 - 0.1 K/uL   WBC  Morphology TOXIC GRANULATION    Smear Review MORPHOLOGY UNREMARKABLE    nRBC 3 (H) 0 /100 WBC   Metamyelocytes Relative 5 %   Abs Immature Granulocytes 0.30 (H) 0.00 - 0.07 K/uL   Reactive, Benign Lymphocytes PRESENT    Polychromasia PRESENT     Comment: Performed at [facility], [address] [place], [place] [address]  Comprehensive metabolic panel     Status: Abnormal   Collection Time: 02/15/23  8:07 AM  Result Value Ref Range   Sodium 140 135 - 145 mmol/L   Potassium 2.5 (LL) 3.5 - 5.1 mmol/L    Comment: CRITICAL RESULT CALLED TO, READ BACK BY AND VERIFIED WITH PAGE,C AT 9:10AM ON 02/15/23 BY FESTERMAN,C   Chloride 105 98 - 111 mmol/L   CO2 25 22 - 32 mmol/L   Glucose, Bld 89 70 - 99 mg/dL    Comment: Glucose reference range applies only to samples taken after fasting for at least 8 hours.   BUN 7 (L) 8 - 23 mg/dL   Creatinine, Ser 2.84 0.44 - 1.00 mg/dL   Calcium 8.3 (L) 8.9 - 10.3 mg/dL   Total Protein 5.8 (L) 6.5 - 8.1 g/dL   Albumin 2.8 (L) 3.5 - 5.0 g/dL   AST 19 15 - 41 U/L   ALT 14 0 - 44 U/L   Alkaline Phosphatase 69  38 - 126 U/L   Total Bilirubin 0.7 0.3 - 1.2 mg/dL   GFR, Estimated >16 >10 mL/min    Comment: (NOTE) Calculated using the CKD-EPI Creatinine Equation (2021)    Anion gap 10 5 - 15    Comment: Performed at [facility], [address], [place], [place] [address]  Magnesium     Status: Abnormal   Collection Time: 02/15/23  8:07 AM  Result Value Ref Range   Magnesium 1.5 (L) 1.7 - 2.4 mg/dL    Comment: Performed at [facility], [address] [place], [place] [address]  Sample to Blood Bank     Status: None   Collection Time: 02/15/23  8:28 AM  Result Value Ref Range   Blood Bank Specimen SAMPLE AVAILABLE FOR TESTING    Sample Expiration      02/16/2023,2359 Performed at [facility], [address] [place], [place] [address]   Magnesium     Status: None   Collection Time: 02/17/23  7:41 AM  Result Value Ref Range   Magnesium 1.8 1.7 - 2.4  mg/dL    Comment: Performed at [facility], [address], [place], [place] [address]  CBC with Differential     Status: Abnormal   Collection Time: 02/17/23  7:41 AM  Result Value Ref Range   WBC 6.5 4.0 - 10.5 K/uL   RBC 3.36 (L) 3.87 - 5.11 MIL/uL   Hemoglobin 8.9 (L) 12.0 - 15.0 g/dL   HCT 21.3 (L) 08.6 - 57.8 %   MCV 83.9 80.0 - 100.0 fL   MCH 26.5 26.0 - 34.0 pg   MCHC 31.6 30.0 - 36.0 g/dL   RDW 46.9 (H) 62.9 - 52.8 %   Platelets 154 150 - 400 K/uL   nRBC 1.7 (H) 0.0 - 0.2 %   Neutrophils Relative % 48 %   Neutro Abs 3.9 1.7 - 7.7 K/uL   Band Neutrophils 12 %   Lymphocytes Relative 29 %   Lymphs Abs 1.9 0.7 - 4.0 K/uL   Monocytes Relative 3 %   Monocytes Absolute 0.2 0.1 - 1.0 K/uL   Eosinophils Relative 3 %   Eosinophils Absolute 0.2 0.0 - 0.5 K/uL   Basophils Relative 0 %   Basophils Absolute 0.0 0.0 - 0.1 K/uL   RBC Morphology See Note     Comment: ANISOCYTOSIS   Smear Review MORPHOLOGY UNREMARKABLE    Metamyelocytes Relative 2 %   Myelocytes 3 %   Abs Immature Granulocytes 0.30 (H) 0.00 - 0.07 K/uL   Reactive, Benign Lymphocytes PRESENT    Polychromasia PRESENT     Comment: Performed at [facility], [address], [place], [place] [address]  Comprehensive metabolic panel     Status: Abnormal   Collection Time: 02/17/23  7:41 AM  Result Value Ref Range   Sodium 139 135 - 145 mmol/L   Potassium 2.9 (L) 3.5 - 5.1 mmol/L   Chloride 108 98 - 111 mmol/L   CO2 24 22 - 32 mmol/L   Glucose, Bld 87 70 - 99 mg/dL    Comment: Glucose reference range applies only to samples taken after fasting for at least 8 hours.   BUN 7 (L) 8 - 23 mg/dL   Creatinine, Ser 4.01 0.44 - 1.00 mg/dL   Calcium 8.1 (L) 8.9 - 10.3 mg/dL   Total Protein 5.8 (L) 6.5 - 8.1 g/dL   Albumin 2.8 (L) 3.5 - 5.0 g/dL   AST 20 15 - 41 U/L  ALT 13 0 - 44 U/L   Alkaline Phosphatase 74 38 - 126 U/L   Total Bilirubin 0.6 0.3 - 1.2 mg/dL   GFR, Estimated >40 >98 mL/min    Comment: (NOTE) Calculated  using the CKD-EPI Creatinine Equation (2021)    Anion gap 7 5 - 15    Comment: Performed at [facility], [address] [place], [place] [address]  Sample to Blood Bank     Status: None   Collection Time: 02/17/23  7:52 AM  Result Value Ref Range   Blood Bank Specimen SAMPLE AVAILABLE FOR TESTING    Sample Expiration      02/18/2023,2359 Performed at [facility], [address] [place], [place] [address]   Comprehensive metabolic panel     Status: Abnormal   Collection Time: 02/21/23  1:39 PM  Result Value Ref Range   Sodium 136 135 - 145 mmol/L   Potassium 3.3 (L) 3.5 - 5.1 mmol/L   Chloride 104 98 - 111 mmol/L   CO2 25 22 - 32 mmol/L   Glucose, Bld 141 (H) 70 - 99 mg/dL    Comment: Glucose reference range applies only to samples taken after fasting for at least 8 hours.   BUN 11 8 - 23 mg/dL   Creatinine, Ser 6.21 0.44 - 1.00 mg/dL   Calcium 8.0 (L) 8.9 - 10.3 mg/dL   Total Protein 5.7 (L) 6.5 - 8.1 g/dL   Albumin 2.9 (L) 3.5 - 5.0 g/dL   AST 41 15 - 41 U/L   ALT 18 0 - 44 U/L   Alkaline Phosphatase 103 38 - 126 U/L   Total Bilirubin 0.8 0.3 - 1.2 mg/dL   GFR, Estimated >30 >86 mL/min    Comment: (NOTE) Calculated using the CKD-EPI Creatinine Equation (2021)    Anion gap 7 5 - 15    Comment: Performed at [facility] [address], [place], [place] [address]  CBC with Differential     Status: Abnormal   Collection Time: 02/21/23  1:39 PM  Result Value Ref Range   WBC 36.6 (H) 4.0 - 10.5 K/uL   RBC 3.14 (L) 3.87 - 5.11 MIL/uL   Hemoglobin 8.5 (L) 12.0 - 15.0 g/dL   HCT 96.2 (L) 95.2 - 84.1 %   MCV 85.0 80.0 - 100.0 fL   MCH 27.1 26.0 - 34.0 pg   MCHC 31.8 30.0 - 36.0 g/dL   RDW 32.4 (H) 40.1 - 02.7 %   Platelets 135 (L) 150 - 400 K/uL   nRBC 0.0 0.0 - 0.2 %   Neutrophils Relative % 89 %   Neutro Abs 34.0 (H) 1.7 - 7.7 K/uL   Band Neutrophils 4 %   Lymphocytes Relative 6 %   Lymphs Abs 2.2 0.7 - 4.0 K/uL   Monocytes Relative 1 %   Monocytes Absolute 0.4 0.1 -  1.0 K/uL   Eosinophils Relative 0 %   Eosinophils Absolute 0.0 0.0 - 0.5 K/uL   Basophils Relative 0 %   Basophils Absolute 0.0 0.0 - 0.1 K/uL   RBC Morphology ANISOCYTOSIS    Smear Review MORPHOLOGY UNREMARKABLE    Abs Immature Granulocytes 0.00 0.00 - 0.07 K/uL    Comment: Performed at [facility], [address] [place], [place] [address]  Troponin I (High Sensitivity)     Status: None   Collection Time: 02/21/23  3:30 PM  Result Value Ref Range   Troponin I (High Sensitivity) 7 <18 ng/L    Comment: (NOTE)  Elevated high sensitivity troponin I (hsTnI) values and significant  changes across serial measurements may suggest ACS but many other  chronic and acute conditions are known to elevate hsTnI results.  Refer to the "Links" section for chest pain algorithms and additional  guidance. Performed at [facility], [address] [place], [place] [address]   Lactic acid, plasma     Status: None   Collection Time: 02/21/23  3:30 PM  Result Value Ref Range   Lactic Acid, Venous 1.3 0.5 - 1.9 mmol/L    Comment: Performed at [facility], [address] [place], [place] [address]  Blood culture (routine x 2)     Status: None   Collection Time: 02/21/23  3:30 PM   Specimen: Right Antecubital; Blood  Result Value Ref Range   Specimen Description RIGHT ANTECUBITAL    Special Requests      BOTTLES DRAWN AEROBIC AND ANAEROBIC Blood Culture results may not be optimal due to an excessive volume of blood received in culture bottles   Culture      NO GROWTH 5 DAYS Performed at [facility], [address], [place], [place] [address]    Report Status 02/26/2023 FINAL   Blood culture (routine x 2)     Status: None   Collection Time: 02/21/23  3:30 PM   Specimen: BLOOD RIGHT HAND  Result Value Ref Range   Specimen Description BLOOD RIGHT HAND BOTTLES DRAWN AEROBIC ONLY    Special Requests Blood Culture adequate volume    Culture      NO GROWTH 5 DAYS Performed at [facility], [address] [place], [place] [address]    Report Status 02/26/2023 FINAL   Lipase, blood     Status: None   Collection Time: 02/21/23  3:30 PM  Result Value Ref Range   Lipase 26 11 - 51 U/L    Comment: Performed at [facility], [address], [place], [place] [address]  Lactic acid, plasma     Status: None   Collection Time: 02/21/23  5:21 PM  Result Value Ref Range   Lactic Acid, Venous 1.2 0.5 - 1.9 mmol/L    Comment: Performed at [facility], [address], [place], [place] [address]  Troponin I (High Sensitivity)     Status: None   Collection Time: 02/21/23  5:21 PM  Result Value Ref Range   Troponin I (High Sensitivity) 6 <18 ng/L    Comment: (NOTE) Elevated high sensitivity troponin I (hsTnI) values and significant  changes across serial measurements may suggest ACS but many other  chronic and acute conditions are known to elevate hsTnI results.  Refer to the "Links" section for chest pain algorithms and additional  guidance. Performed at [facility], [address], [place], [place] [address]   Lipase, blood     Status: None   Collection Time: 02/22/23  4:21 AM  Result Value Ref Range   Lipase 29 11 - 51 U/L    Comment: Performed at [facility], [address], [place], [place] [address]  Comprehensive metabolic panel     Status: Abnormal   Collection Time: 02/22/23  4:21 AM  Result Value Ref Range   Sodium 136 135 - 145 mmol/L   Potassium 3.0 (L) 3.5 - 5.1 mmol/L   Chloride 104 98 - 111 mmol/L   CO2 25 22 - 32 mmol/L   Glucose, Bld 136 (H) 70 - 99 mg/dL    Comment: Glucose reference range applies only to samples  taken after fasting for at least 8 hours.   BUN 9 8 - 23 mg/dL   Creatinine, Ser 3.66 0.44 - 1.00 mg/dL   Calcium 7.8 (L) 8.9 - 10.3 mg/dL   Total Protein 5.5 (L) 6.5 - 8.1 g/dL   Albumin 2.7 (L) 3.5 - 5.0 g/dL   AST 35 15 - 41 U/L   ALT 18 0 - 44 U/L   Alkaline Phosphatase 104 38 - 126 U/L   Total Bilirubin 0.8 0.3 - 1.2 mg/dL   GFR, Estimated >44 >03  mL/min    Comment: (NOTE) Calculated using the CKD-EPI Creatinine Equation (2021)    Anion gap 7 5 - 15    Comment: Performed at [facility], [address] [place], [place] [address]  CBC     Status: Abnormal   Collection Time: 02/22/23  4:21 AM  Result Value Ref Range   WBC 24.9 (H) 4.0 - 10.5 K/uL   RBC 2.87 (L) 3.87 - 5.11 MIL/uL   Hemoglobin 7.9 (L) 12.0 - 15.0 g/dL   HCT 95.6 (L) 38.7 - 56.4 %   MCV 85.4 80.0 - 100.0 fL   MCH 27.5 26.0 - 34.0 pg   MCHC 32.2 30.0 - 36.0 g/dL   RDW 33.2 (H) 95.1 - 88.4 %   Platelets 106 (L) 150 - 400 K/uL   nRBC 0.0 0.0 - 0.2 %    Comment: Performed at [facility], [address] [place], [place] [address]  CBC with Differential     Status: Abnormal   Collection Time: 02/24/23  8:29 AM  Result Value Ref Range   WBC 1.8 (L) 4.0 - 10.5 K/uL   RBC 2.57 (L) 3.87 - 5.11 MIL/uL   Hemoglobin 7.0 (L) 12.0 - 15.0 g/dL   HCT 30.1 (L) 60.1 - 09.3 %   MCV 84.4 80.0 - 100.0 fL   MCH 27.2 26.0 - 34.0 pg   MCHC 32.3 30.0 - 36.0 g/dL   RDW 23.5 (H) 57.3 - 22.0 %   Platelets 55 (L) 150 - 400 K/uL    Comment: SPECIMEN CHECKED FOR CLOTS Immature Platelet Fraction may be clinically indicated, consider ordering this additional test [id number] PLATELET COUNT CONFIRMED BY SMEAR    nRBC 0.0 0.0 - 0.2 %   Neutrophils Relative % 70 %   Neutro Abs 1.3 (L) 1.7 - 7.7 K/uL   Band Neutrophils 2 %   Lymphocytes Relative 25 %   Lymphs Abs 0.5 (L) 0.7 - 4.0 K/uL   Monocytes Relative 2 %   Monocytes Absolute 0.0 (L) 0.1 - 1.0 K/uL   Eosinophils Relative 0 %   Eosinophils Absolute 0.0 0.0 - 0.5 K/uL   Basophils Relative 0 %   Basophils Absolute 0.0 0.0 - 0.1 K/uL   WBC Morphology DOHLE BODIES     Comment: TOXIC GRANULATION   Smear Review PLATELET COUNT CONFIRMED BY SMEAR    Metamyelocytes Relative 1 %   Abs Immature Granulocytes 0.00 0.00 - 0.07 K/uL   Ovalocytes PRESENT     Comment: Performed at [facility], [address] [place], [place] [address]   Comprehensive metabolic panel     Status: Abnormal   Collection Time: 02/24/23  8:29 AM  Result Value Ref Range   Sodium 136 135 - 145 mmol/L   Potassium 3.1 (L) 3.5 - 5.1 mmol/L   Chloride 103 98 - 111 mmol/L   CO2 25 22 - 32 mmol/L   Glucose, Bld 138 (H) 70 - 99 mg/dL  Comment: Glucose reference range applies only to samples taken after fasting for at least 8 hours.   BUN 7 (L) 8 - 23 mg/dL   Creatinine, Ser 1.61 0.44 - 1.00 mg/dL   Calcium 7.9 (L) 8.9 - 10.3 mg/dL   Total Protein 5.5 (L) 6.5 - 8.1 g/dL   Albumin 2.7 (L) 3.5 - 5.0 g/dL   AST 29 15 - 41 U/L   ALT 17 0 - 44 U/L   Alkaline Phosphatase 90 38 - 126 U/L   Total Bilirubin 1.0 0.3 - 1.2 mg/dL   GFR, Estimated >09 >60 mL/min    Comment: (NOTE) Calculated using the CKD-EPI Creatinine Equation (2021)    Anion gap 8 5 - 15    Comment: Performed at [facility], [address], [place], [place] [address]  Magnesium     Status: None   Collection Time: 02/24/23  8:29 AM  Result Value Ref Range   Magnesium 1.8 1.7 - 2.4 mg/dL    Comment: Performed at [facility], [address] [place], [place] [address]  Prepare RBC (crossmatch)     Status: None   Collection Time: 02/24/23  8:29 AM  Result Value Ref Range   Order Confirmation      ORDER PROCESSED BY BLOOD BANK Performed at [facility], [address] [place], [place] [address]   Type and screen     Status: None   Collection Time: 02/24/23  9:50 AM  Result Value Ref Range   ABO/RH(D) A POS    Antibody Screen NEG    Sample Expiration 02/27/2023,2359    Unit Number [id number]    Blood Component Type RBC LR PHER1    Unit division 00    Status of Unit ISSUED,FINAL    Transfusion Status OK TO TRANSFUSE    Crossmatch Result Compatible    Unit Number [id number]    Blood Component Type RED CELLS,LR    Unit division 00    Status of Unit ISSUED,FINAL    Transfusion Status OK TO TRANSFUSE    Crossmatch Result      Compatible Performed at [facility],  [address] [place], [place] [address]   BPAM RBC     Status: None   Collection Time: 02/24/23  9:50 AM  Result Value Ref Range   ISSUE DATE / TIME 253664403474    Blood Product Unit Number [id number]    PRODUCT CODE I9518A41    Unit Type and Rh 6200    Blood Product Expiration Date [id number]    ISSUE DATE / TIME [id number]    Blood Product Unit Number [id number]    PRODUCT CODE D1761Y07    Unit Type and Rh 6200    Blood Product Expiration Date [id number]   Prepare RBC (crossmatch)     Status: None   Collection Time: 02/24/23 10:30 AM  Result Value Ref Range   Order Confirmation      ORDER PROCESSED BY BLOOD BANK Performed at [facility], [address], [place], [place] [address]   C difficile quick screen w PCR reflex     Status: None   Collection Time: 02/25/23  8:08 AM   Specimen: STOOL  Result Value Ref Range   C Diff antigen NEGATIVE NEGATIVE   C Diff toxin NEGATIVE NEGATIVE   C Diff interpretation No C. difficile detected.     Comment: Performed at [facility], [address], [place], [place] [address]  GI pathogen panel by PCR, stool  Status: None   Collection Time: 02/25/23  8:08 AM   Specimen: Stool  Result Value Ref Range   Plesiomonas shigelloides NOT DETECTED NOT DETECTED   Yersinia enterocolitica NOT DETECTED NOT DETECTED   Vibrio NOT DETECTED NOT DETECTED   Enteropathogenic E coli NOT DETECTED NOT DETECTED   E coli (ETEC) LT/ST NOT DETECTED NOT DETECTED   E coli 0157 by PCR Not applicable NOT DETECTED   Cryptosporidium by PCR NOT DETECTED NOT DETECTED   Entamoeba histolytica NOT DETECTED NOT DETECTED   Adenovirus F 40/41 NOT DETECTED NOT DETECTED   Norovirus GI/GII NOT DETECTED NOT DETECTED   Sapovirus NOT DETECTED NOT DETECTED    Comment: (NOTE) Performed At: [facility] [address] [place], [place] [id number] Jolene Schimke MD WU:[id number]    Vibrio cholerae NOT DETECTED NOT DETECTED   Campylobacter by PCR NOT DETECTED NOT  DETECTED   Salmonella by PCR NOT DETECTED NOT DETECTED   E coli (STEC) NOT DETECTED NOT DETECTED   Enteroaggregative E coli NOT DETECTED NOT DETECTED   Shigella by PCR NOT DETECTED NOT DETECTED   Cyclospora cayetanensis NOT DETECTED NOT DETECTED   Astrovirus NOT DETECTED NOT DETECTED   G lamblia by PCR NOT DETECTED NOT DETECTED   Rotavirus A by PCR NOT DETECTED NOT DETECTED  CBC with Differential     Status: Abnormal   Collection Time: 02/26/23  8:19 AM  Result Value Ref Range   WBC 1.5 (L) 4.0 - 10.5 K/uL   RBC 2.70 (L) 3.87 - 5.11 MIL/uL   Hemoglobin 7.4 (L) 12.0 - 15.0 g/dL   HCT 95.6 (L) 21.3 - 08.6 %   MCV 83.3 80.0 - 100.0 fL   MCH 27.4 26.0 - 34.0 pg   MCHC 32.9 30.0 - 36.0 g/dL   RDW 57.8 (H) 46.9 - 62.9 %   Platelets 56 (L) 150 - 400 K/uL    Comment: SPECIMEN CHECKED FOR CLOTS Immature Platelet Fraction may be clinically indicated, consider ordering this additional test [id number] CONSISTENT WITH PREVIOUS RESULT PLATELET COUNT CONFIRMED BY SMEAR    nRBC 0.0 0.0 - 0.2 %   Neutrophils Relative % 57 %   Neutro Abs 0.9 (L) 1.7 - 7.7 K/uL   Band Neutrophils 3 %   Lymphocytes Relative 30 %   Lymphs Abs 0.5 (L) 0.7 - 4.0 K/uL   Monocytes Relative 7 %   Monocytes Absolute 0.1 0.1 - 1.0 K/uL   Eosinophils Relative 1 %   Eosinophils Absolute 0.0 0.0 - 0.5 K/uL   Basophils Relative 1 %   Basophils Absolute 0.0 0.0 - 0.1 K/uL   WBC Morphology TOXIC GRANULATION    RBC Morphology ANISOCYTOSIS    Smear Review PLATELET COUNT CONFIRMED BY SMEAR    Metamyelocytes Relative 1 %   Abs Immature Granulocytes 0.00 0.00 - 0.07 K/uL   Reactive, Benign Lymphocytes PRESENT     Comment: Performed at [facility], [address] [place], [place] [address]  Comprehensive metabolic panel     Status: Abnormal   Collection Time: 02/26/23  8:19 AM  Result Value Ref Range   Sodium 136 135 - 145 mmol/L   Potassium 2.8 (L) 3.5 - 5.1 mmol/L   Chloride 100 98 - 111 mmol/L   CO2 28 22 - 32 mmol/L    Glucose, Bld 88 70 - 99 mg/dL    Comment: Glucose reference range applies only to samples taken after fasting for at least 8 hours.   BUN 5 (L) 8 -  23 mg/dL   Creatinine, Ser 6.04 0.44 - 1.00 mg/dL   Calcium 8.2 (L) 8.9 - 10.3 mg/dL   Total Protein 5.6 (L) 6.5 - 8.1 g/dL   Albumin 2.7 (L) 3.5 - 5.0 g/dL   AST 25 15 - 41 U/L   ALT 14 0 - 44 U/L   Alkaline Phosphatase 76 38 - 126 U/L   Total Bilirubin 0.9 0.3 - 1.2 mg/dL   GFR, Estimated >54 >09 mL/min    Comment: (NOTE) Calculated using the CKD-EPI Creatinine Equation (2021)    Anion gap 8 5 - 15    Comment: Performed at [facility], [address], [place], [place] [address]  Magnesium     Status: Abnormal   Collection Time: 02/26/23  8:19 AM  Result Value Ref Range   Magnesium 1.6 (L) 1.7 - 2.4 mg/dL    Comment: Performed at [facility], [address], [place], [place] [address]  CBC with Differential     Status: Abnormal   Collection Time: 03/01/23  8:20 AM  Result Value Ref Range   WBC 2.8 (L) 4.0 - 10.5 K/uL   RBC 3.45 (L) 3.87 - 5.11 MIL/uL   Hemoglobin 9.6 (L) 12.0 - 15.0 g/dL   HCT 56.2 (L) 13.0 - 86.5 %   MCV 85.2 80.0 - 100.0 fL   MCH 27.8 26.0 - 34.0 pg   MCHC 32.7 30.0 - 36.0 g/dL   RDW 78.4 (H) 69.6 - 29.5 %   Platelets 120 (L) 150 - 400 K/uL   nRBC 3.5 (H) 0.0 - 0.2 %   Neutrophils Relative % 25 %   Neutro Abs 0.8 (L) 1.7 - 7.7 K/uL   Band Neutrophils 3 %   Lymphocytes Relative 53 %   Lymphs Abs 1.5 0.7 - 4.0 K/uL   Monocytes Relative 10 %   Monocytes Absolute 0.3 0.1 - 1.0 K/uL   Eosinophils Relative 4 %   Eosinophils Absolute 0.1 0.0 - 0.5 K/uL   Basophils Relative 0 %   Basophils Absolute 0.0 0.0 - 0.1 K/uL   RBC Morphology ANISOCYTOSIS    Smear Review MORPHOLOGY UNREMARKABLE    Metamyelocytes Relative 3 %   Myelocytes 2 %   Abs Immature Granulocytes 0.10 (H) 0.00 - 0.07 K/uL   Reactive, Benign Lymphocytes PRESENT    Polychromasia PRESENT     Comment: Performed at [facility], [address], [place], [place] [address]  Comprehensive metabolic panel     Status: Abnormal   Collection Time: 03/01/23  8:20 AM  Result Value Ref Range   Sodium 138 135 - 145 mmol/L   Potassium 2.8 (L) 3.5 - 5.1 mmol/L   Chloride 101 98 - 111 mmol/L   CO2 27 22 - 32 mmol/L   Glucose, Bld 128 (H) 70 - 99 mg/dL    Comment: Glucose reference range applies only to samples taken after fasting for at least 8 hours.   BUN <5 (L) 8 - 23 mg/dL   Creatinine, Ser 2.44 0.44 - 1.00 mg/dL   Calcium 8.2 (L) 8.9 - 10.3 mg/dL   Total Protein 5.7 (L) 6.5 - 8.1 g/dL   Albumin 2.8 (L) 3.5 - 5.0 g/dL   AST 22 15 - 41 U/L   ALT 13 0 - 44 U/L   Alkaline Phosphatase 79 38 - 126 U/L   Total Bilirubin 0.8 0.3 - 1.2 mg/dL   GFR, Estimated >01 >02 mL/min    Comment: (NOTE) Calculated using the CKD-EPI Creatinine Equation (  2021)    Anion gap 10 5 - 15    Comment: Performed at [facility], [address] [place], [place] [address]  Magnesium     Status: Abnormal   Collection Time: 03/01/23  8:20 AM  Result Value Ref Range   Magnesium 1.6 (L) 1.7 - 2.4 mg/dL    Comment: Performed at [facility], [address], [place], [place] [address]  Sample to Blood Bank     Status: None   Collection Time: 03/01/23  8:26 AM  Result Value Ref Range   Blood Bank Specimen SAMPLE AVAILABLE FOR TESTING    Sample Expiration      03/02/2023,2359 Performed at [facility], [address], [place], [place] [address]   Magnesium     Status: None   Collection Time: 03/03/23  8:21 AM  Result Value Ref Range   Magnesium 1.9 1.7 - 2.4 mg/dL    Comment: Performed at [facility], [address], [place], [place] [address]  CBC with Differential     Status: Abnormal   Collection Time: 03/03/23  8:21 AM  Result Value Ref Range   WBC 13.5 (H) 4.0 - 10.5 K/uL   RBC 3.46 (L) 3.87 - 5.11 MIL/uL   Hemoglobin 9.5 (L) 12.0 - 15.0 g/dL   HCT 29.5 (L) 62.1 - 30.8 %   MCV 86.7 80.0 - 100.0 fL   MCH 27.5 26.0 - 34.0 pg   MCHC 31.7 30.0 - 36.0 g/dL    RDW 65.7 (H) 84.6 - 15.5 %   Platelets 136 (L) 150 - 400 K/uL   nRBC 1.8 (H) 0.0 - 0.2 %   Neutrophils Relative % 73 %   Neutro Abs 10.1 (H) 1.7 - 7.7 K/uL   Band Neutrophils 2 %   Lymphocytes Relative 16 %   Lymphs Abs 2.2 0.7 - 4.0 K/uL   Monocytes Relative 5 %   Monocytes Absolute 0.7 0.1 - 1.0 K/uL   Eosinophils Relative 2 %   Eosinophils Absolute 0.3 0.0 - 0.5 K/uL   Basophils Relative 0 %   Basophils Absolute 0.0 0.0 - 0.1 K/uL   WBC Morphology TOXIC GRANULATION    RBC Morphology See Note     Comment: ANISOCYTOSIS   Smear Review MORPHOLOGY UNREMARKABLE    nRBC 1 (H) 0 /100 WBC   Metamyelocytes Relative 2 %   Abs Immature Granulocytes 0.30 (H) 0.00 - 0.07 K/uL   Polychromasia PRESENT     Comment: Performed at [facility], [address], [place], [place] [address]  Comprehensive metabolic panel     Status: Abnormal   Collection Time: 03/03/23  8:21 AM  Result Value Ref Range   Sodium 138 135 - 145 mmol/L   Potassium 2.9 (L) 3.5 - 5.1 mmol/L   Chloride 104 98 - 111 mmol/L   CO2 26 22 - 32 mmol/L   Glucose, Bld 165 (H) 70 - 99 mg/dL    Comment: Glucose reference range applies only to samples taken after fasting for at least 8 hours.   BUN <5 (L) 8 - 23 mg/dL   Creatinine, Ser 2.84 (H) 0.44 - 1.00 mg/dL   Calcium 8.2 (L) 8.9 - 10.3 mg/dL   Total Protein 5.6 (L) 6.5 - 8.1 g/dL   Albumin 2.8 (L) 3.5 - 5.0 g/dL   AST 25 15 - 41 U/L   ALT 15 0 - 44 U/L   Alkaline Phosphatase 97 38 - 126 U/L   Total Bilirubin 1.1 0.3 - 1.2  mg/dL   GFR, Estimated >16 >10 mL/min    Comment: (NOTE) Calculated using the CKD-EPI Creatinine Equation (2021)    Anion gap 8 5 - 15    Comment: Performed at [facility], [address] [place], [place] [address]  Sample to Blood Bank     Status: None   Collection Time: 03/03/23  8:21 AM  Result Value Ref Range   Blood Bank Specimen SAMPLE AVAILABLE FOR TESTING    Sample Expiration      03/04/2023,2359 Performed at [facility], [address], [place], [place] [address]     Lipid Panel     Component Value Date/Time   CHOL 135 09/03/2022 1305   TRIG 95 09/03/2022 1305   HDL 41 09/03/2022 1305   CHOLHDL 3.3 09/03/2022 1305   CHOLHDL 3.1 04/17/2020 0803   LDLCALC 76 09/03/2022 1305   LDLCALC 56 04/17/2020 0803     Assessment & Plan:   1) Type 2 diabetes mellitus with complication, with long-term current use of insulin (HCC)  - Patient has currently controlled type 2 DM since 66 years of age.  She presents today with her meter and logs showing tightening glycemic profile.  Her most recent A1c was 6.6% on 12/24/22.  She is currently undergoing chemotherapy for rectal cancer which has dramatically affected her diet.  She has not taken her Novolog in some time due to not eating/ not meeting glucose requirements to do so.  Analysis of her meter shows 7-day average of 107, 14-day average of 106, 30-day average of 114, 90-day average of 141.   Recent labs reviewed.   Her diabetes is complicated by obesity/sedentary life, peripheral neuropathy and patient remains at a high risk for more acute and chronic complications which include CAD, CVA, CKD, retinopathy, and neuropathy. These are all discussed in detail with the patient.  - Nutritional counseling repeated at each appointment due to patients tendency to fall back in to old habits.  - The patient admits there is a room for improvement in their diet and drink choices. -  Suggestion is made for the patient to avoid simple carbohydrates from their diet including Cakes, Sweet Desserts / Pastries, Ice Cream, Soda (diet and regular), Sweet Tea, Candies, Chips, Cookies, Sweet Pastries, Store Bought Juices, Alcohol in Excess of 1-2 drinks a day, Artificial Sweeteners, Coffee Creamer, and "Sugar-free" Products. This will help patient to have stable blood glucose profile and potentially avoid unintended weight gain.   - I encouraged the patient to switch to unprocessed or minimally  processed complex starch and increased protein intake (animal or plant source), fruits, and vegetables.   - Patient is advised to stick to a routine mealtimes to eat 3 meals a day and avoid unnecessary snacks (to snack only to correct hypoglycemia).  - I have approached patient with the following individualized plan to manage diabetes and patient agrees:   -She is advised to lower her Lantus to 40 units SQ nightly, lower Novolog to 0-6 units TID with meals if glucose is above 150 and she is eating (Specific instructions on how to titrate insulin dosage based on glucose readings given to patient in writing).  She is to stop both her Mounjaro and Metformin today due to decreased appetite from chemo.   -She is encouraged to continue monitoring blood glucose 4 times daily, before meals and at bedtime and call the clinic if readings are less than 70 or greater than 200 for 3 tests in a row.     -  Patient specific target  A1c;  LDL, HDL, Triglycerides were discussed in detail.  2) Lipids/HPL: Her most recent lipid panel from 09/03/22 shows controlled LDL of 76.  She is advised to continue Pravastatin 20 mg po daily at bedtime.  Side effects and precautions discussed with her.   3) Hypertension:  Her blood pressure is controlled to target.  She is advised to continue Losartan 100 mg po daily.    4) RAI induced hypothyroidism   -She is status post ablative therapy with I-131 for hyperthyroidism on May 16, 2018.    -There are no recent TFTs to review.  She is advised to continue Levothyroxine 88 mcg po daily before breakfast.     - We discussed about the correct intake of her thyroid hormone, on empty stomach at fasting, with water, separated by at least 30 minutes from breakfast and other medications,  and separated by more than 4 hours from calcium, iron, multivitamins, acid reflux medications (PPIs). -Patient is made aware of the fact that thyroid hormone replacement is needed for life, dose to be  adjusted by periodic monitoring of thyroid function tests.  5) Weight management:  Her Body mass index is 41.5 kg/m.-clearly complicating her diabetes care. She is a candidate for modest weight loss.  Carbs education was provided to patient-see above. She has recently lost 19 lbs.  We discussed bariatric surgery at last visit but she never followed up with them.  - I advised patient to maintain close follow up with Gwenlyn Found, MD for primary care needs.     I spent  25  minutes in the care of the patient today including review of labs from CMP, Lipids, Thyroid Function, Hematology (current and previous including abstractions from other facilities); face-to-face time discussing  her blood glucose readings/logs, discussing hypoglycemia and hyperglycemia episodes and symptoms, medications doses, her options of short and long term treatment based on the latest standards of care / guidelines;  discussion about incorporating lifestyle medicine;  and documenting the encounter. Risk reduction counseling performed per USPSTF guidelines to reduce obesity and cardiovascular risk factors.     Please refer to Patient Instructions for Blood Glucose Monitoring and Insulin/Medications Dosing Guide"  in media tab for additional information. Please  also refer to " Patient Self Inventory" in the Media  tab for reviewed elements of pertinent patient history.  Janeen E Beyersdorf participated in the discussions, expressed understanding, and voiced agreement with the above plans.  All questions were answered to her satisfaction. she is encouraged to contact clinic should she have any questions or concerns prior to her return visit.    Follow up plan: - Return in about 3 months (around 06/04/2023) for Diabetes F/U with A1c in office, No previsit labs, Bring meter and logs.  Ronny Bacon, [facility] [address] [place], [place] [address] Phone: [phone] Fax:  [phone]  03/05/2023, 10:15 AM

## 2023-03-05 NOTE — Patient Instructions (Signed)
[facility]  Discharge Instructions: Thank you for choosing [facility] to provide your oncology and hematology care.  If you have a lab appointment with the Cancer Center - please note that after April 8th, 2024, all labs will be drawn in the cancer center.  You do not have to check in or register with the main entrance as you have in the past but will complete your check-in in the cancer center.  Wear comfortable clothing and clothing appropriate for easy access to any Portacath or PICC line.   We strive to give you quality time with your provider. You may need to reschedule your appointment if you arrive late (15 or more minutes).  Arriving late affects you and other patients whose appointments are after yours.  Also, if you miss three or more appointments without notifying the office, you may be dismissed from the clinic at the provider's discretion.      For prescription refill requests, have your pharmacy contact our office and allow 72 hours for refills to be completed.    Today you received the following chemotherapy and/or immunotherapy agents Udenyca.  Pegfilgrastim Injection What is this medication? PEGFILGRASTIM (PEG fil gra stim) lowers the risk of infection in people who are receiving chemotherapy. It works by helping your body make more white blood cells, which protects your body from infection. It may also be used to help people who have been exposed to high doses of radiation. This medicine may be used for other purposes; ask your health care provider or pharmacist if you have questions. COMMON BRAND NAME(S): Fulphila, Fylnetra, Neulasta, Nyvepria, Stimufend, UDENYCA, Ziextenzo What should I tell my care team before I take this medication? They need to know if you have any of these conditions: Kidney disease Latex allergy Ongoing radiation therapy Sickle cell disease Skin reactions to acrylic adhesives (On-Body Injector only) An unusual or  allergic reaction to pegfilgrastim, filgrastim, other medications, foods, dyes, or preservatives Pregnant or trying to get pregnant Breast-feeding How should I use this medication? This medication is for injection under the skin. If you get this medication at home, you will be taught how to prepare and give the pre-filled syringe or how to use the On-body Injector. Refer to the patient Instructions for Use for detailed instructions. Use exactly as directed. Tell your care team immediately if you suspect that the On-body Injector may not have performed as intended or if you suspect the use of the On-body Injector resulted in a missed or partial dose. It is important that you put your used needles and syringes in a special sharps container. Do not put them in a trash can. If you do not have a sharps container, call your pharmacist or care team to get one. Talk to your care team about the use of this medication in children. While this medication may be prescribed for selected conditions, precautions do apply. Overdosage: If you think you have taken too much of this medicine contact a poison control center or emergency room at once. NOTE: This medicine is only for you. Do not share this medicine with others. What if I miss a dose? It is important not to miss your dose. Call your care team if you miss your dose. If you miss a dose due to an On-body Injector failure or leakage, a new dose should be administered as soon as possible using a single prefilled syringe for manual use. What may interact with this medication? Interactions have not   been studied. This list may not describe all possible interactions. Give your health care provider a list of all the medicines, herbs, non-prescription drugs, or dietary supplements you use. Also tell them if you smoke, drink alcohol, or use illegal drugs. Some items may interact with your medicine. What should I watch for while using this medication? Your condition will  be monitored carefully while you are receiving this medication. You may need blood work done while you are taking this medication. Talk to your care team about your risk of cancer. You may be more at risk for certain types of cancer if you take this medication. If you are going to need a MRI, CT scan, or other procedure, tell your care team that you are using this medication (On-Body Injector only). What side effects may I notice from receiving this medication? Side effects that you should report to your care team as soon as possible: Allergic reactions--skin rash, itching, hives, swelling of the face, lips, tongue, or throat Capillary leak syndrome--stomach or muscle pain, unusual weakness or fatigue, feeling faint or lightheaded, decrease in the amount of urine, swelling of the ankles, hands, or feet, trouble breathing High white blood cell level--fever, fatigue, trouble breathing, night sweats, change in vision, weight loss Inflammation of the aorta--fever, fatigue, back, chest, or stomach pain, severe headache Kidney injury (glomerulonephritis)--decrease in the amount of urine, red or dark Cherylyn Sundby urine, foamy or bubbly urine, swelling of the ankles, hands, or feet Shortness of breath or trouble breathing Spleen injury--pain in upper left stomach or shoulder Unusual bruising or bleeding Side effects that usually do not require medical attention (report to your care team if they continue or are bothersome): Bone pain Pain in the hands or feet This list may not describe all possible side effects. Call your doctor for medical advice about side effects. You may report side effects to FDA at 1-800-FDA-1088. Where should I keep my medication? Keep out of the reach of children. If you are using this medication at home, you will be instructed on how to store it. Throw away any unused medication after the expiration date on the label. NOTE: This sheet is a summary. It may not cover all possible  information. If you have questions about this medicine, talk to your doctor, pharmacist, or health care provider.  2023 Elsevier/Gold Standard (2021-07-25 00:00:00)        To help prevent nausea and vomiting after your treatment, we encourage you to take your nausea medication as directed.  BELOW ARE SYMPTOMS THAT SHOULD BE REPORTED IMMEDIATELY: *FEVER GREATER THAN 100.4 F (38 C) OR HIGHER *CHILLS OR SWEATING *NAUSEA AND VOMITING THAT IS NOT CONTROLLED WITH YOUR NAUSEA MEDICATION *UNUSUAL SHORTNESS OF BREATH *UNUSUAL BRUISING OR BLEEDING *URINARY PROBLEMS (pain or burning when urinating, or frequent urination) *BOWEL PROBLEMS (unusual diarrhea, constipation, pain near the anus) TENDERNESS IN MOUTH AND THROAT WITH OR WITHOUT PRESENCE OF ULCERS (sore throat, sores in mouth, or a toothache) UNUSUAL RASH, SWELLING OR PAIN  UNUSUAL VAGINAL DISCHARGE OR ITCHING   Items with * indicate a potential emergency and should be followed up as soon as possible or go to the Emergency Department if any problems should occur.  Please show the CHEMOTHERAPY ALERT CARD or IMMUNOTHERAPY ALERT CARD at check-in to the Emergency Department and triage nurse.  Should you have questions after your visit or need to cancel or reschedule your appointment, please contact [facility] 276 [phone]  and follow the prompts.  Office hours are 8:00  a.m. to 4:30 p.m. Monday - Friday. Please note that voicemails left after 4:00 p.m. may not be returned until the following business day.  We are closed weekends and major holidays. You have access to a nurse at all times for urgent questions. Please call the main number to the clinic [phone] and follow the prompts.  For any non-urgent questions, you may also contact your provider using MyChart. We now offer e-Visits for anyone 61 and older to request care online for non-urgent symptoms. For details visit [email or web address].   Also download the  MyChart app! Go to the app store, search "MyChart", open the app, select [facility], and log in with your MyChart username and password.

## 2023-03-07 ENCOUNTER — Other Ambulatory Visit: Payer: Self-pay

## 2023-03-08 ENCOUNTER — Other Ambulatory Visit: Payer: Self-pay | Admitting: *Deleted

## 2023-03-08 ENCOUNTER — Other Ambulatory Visit: Payer: Self-pay | Admitting: Hematology

## 2023-03-08 MED ORDER — FUROSEMIDE 20 MG PO TABS: 20.0000 mg | ORAL_TABLET | Freq: Every day | ORAL | 2 refills | Status: DC | PRN

## 2023-03-08 NOTE — Telephone Encounter (Signed)
Patient called to advise that she has begun having edema in legs and feet.  Per Dr. Ellin Saba, will send in Lasix 20 mg Q am as needed.  Advised that she take extra potassium the days she takes lasix.  Instructed to elevate extremities and use compression stockings.  Verbalized understanding.  She also states that she has had some trouble swallowing pills.  Advised that she take one at a time and Dr. Ellin Saba will address at visit on Thursday.

## 2023-03-09 ENCOUNTER — Encounter: Payer: Self-pay | Admitting: Hematology

## 2023-03-10 ENCOUNTER — Inpatient Hospital Stay: Payer: Medicare HMO

## 2023-03-10 ENCOUNTER — Inpatient Hospital Stay (HOSPITAL_BASED_OUTPATIENT_CLINIC_OR_DEPARTMENT_OTHER): Payer: Medicare HMO | Admitting: Hematology

## 2023-03-10 VITALS — BP 125/54 | HR 83 | Temp 98.6°F | Resp 18

## 2023-03-10 VITALS — BP 122/83 | HR 94 | Temp 98.1°F | Resp 18 | Wt 252.2 lb

## 2023-03-10 DIAGNOSIS — C20 Malignant neoplasm of rectum: Secondary | ICD-10-CM

## 2023-03-10 DIAGNOSIS — D509 Iron deficiency anemia, unspecified: Secondary | ICD-10-CM

## 2023-03-10 DIAGNOSIS — Z95828 Presence of other vascular implants and grafts: Secondary | ICD-10-CM

## 2023-03-10 DIAGNOSIS — Z5111 Encounter for antineoplastic chemotherapy: Secondary | ICD-10-CM | POA: Diagnosis not present

## 2023-03-10 LAB — COMPREHENSIVE METABOLIC PANEL
ALT: 16 U/L (ref 0–44)
AST: 32 U/L (ref 15–41)
Albumin: 2.8 g/dL — ABNORMAL LOW (ref 3.5–5.0)
Alkaline Phosphatase: 78 U/L (ref 38–126)
Anion gap: 9 (ref 5–15)
BUN: 10 mg/dL (ref 8–23)
CO2: 24 mmol/L (ref 22–32)
Calcium: 8.2 mg/dL — ABNORMAL LOW (ref 8.9–10.3)
Chloride: 105 mmol/L (ref 98–111)
Creatinine, Ser: 0.73 mg/dL (ref 0.44–1.00)
GFR, Estimated: >60 mL/min (ref >60)
Glucose, Bld: 179 mg/dL — ABNORMAL HIGH (ref 70–99)
Potassium: 3.3 mmol/L — ABNORMAL LOW (ref 3.5–5.1)
Sodium: 138 mmol/L (ref 135–145)
Total Bilirubin: 0.8 mg/dL (ref 0.3–1.2)
Total Protein: 5.6 g/dL — ABNORMAL LOW (ref 6.5–8.1)

## 2023-03-10 LAB — CBC WITH DIFFERENTIAL/PLATELET
Abs Immature Granulocytes: 0.02 10*3/uL (ref 0.00–0.07)
Basophils Absolute: 0 10*3/uL (ref 0.0–0.1)
Basophils Relative: 1 %
Eosinophils Absolute: 0 10*3/uL (ref 0.0–0.5)
Eosinophils Relative: 2 %
HCT: 23.6 % — ABNORMAL LOW (ref 36.0–46.0)
Hemoglobin: 7.5 g/dL — ABNORMAL LOW (ref 12.0–15.0)
Immature Granulocytes: 1 %
Lymphocytes Relative: 27 %
Lymphs Abs: 0.5 10*3/uL — ABNORMAL LOW (ref 0.7–4.0)
MCH: 28 pg (ref 26.0–34.0)
MCHC: 31.8 g/dL (ref 30.0–36.0)
MCV: 88.1 fL (ref 80.0–100.0)
Monocytes Absolute: 0.1 10*3/uL (ref 0.1–1.0)
Monocytes Relative: 3 %
Neutro Abs: 1.2 10*3/uL — ABNORMAL LOW (ref 1.7–7.7)
Neutrophils Relative %: 66 %
Platelets: 86 10*3/uL — ABNORMAL LOW (ref 150–400)
RBC: 2.68 MIL/uL — ABNORMAL LOW (ref 3.87–5.11)
RDW: 21.6 % — ABNORMAL HIGH (ref 11.5–15.5)
WBC: 1.9 10*3/uL — ABNORMAL LOW (ref 4.0–10.5)
nRBC: 0 % (ref 0.0–0.2)

## 2023-03-10 LAB — SAMPLE TO BLOOD BANK

## 2023-03-10 LAB — BPAM RBC: Blood Product Expiration Date: 202405212359

## 2023-03-10 LAB — TYPE AND SCREEN: ABO/RH(D): A POS

## 2023-03-10 LAB — MAGNESIUM: Magnesium: 1.6 mg/dL — ABNORMAL LOW (ref 1.7–2.4)

## 2023-03-10 LAB — PREPARE RBC (CROSSMATCH)

## 2023-03-10 MED ORDER — DIPHENHYDRAMINE HCL 25 MG PO CAPS: 25.0000 mg | ORAL_CAPSULE | Freq: Once | ORAL | Status: DC

## 2023-03-10 MED ORDER — SODIUM CHLORIDE 0.9% FLUSH
10.0000 mL | Freq: Once | INTRAVENOUS | Status: AC
  Administered 2023-03-10: 10 mL via INTRAVENOUS

## 2023-03-10 MED ORDER — SODIUM CHLORIDE 0.9% IV SOLUTION
250.0000 mL | Freq: Once | INTRAVENOUS | Status: AC
  Administered 2023-03-10: 250 mL via INTRAVENOUS

## 2023-03-10 MED ORDER — POTASSIUM CHLORIDE IN NACL 20-0.9 MEQ/L-% IV SOLN
Freq: Once | INTRAVENOUS | Status: AC
  Filled 2023-03-10: qty 1000

## 2023-03-10 MED ORDER — SODIUM CHLORIDE 0.9% FLUSH
10.0000 mL | INTRAVENOUS | Status: AC | PRN
  Administered 2023-03-10: 10 mL

## 2023-03-10 MED ORDER — MAGNESIUM SULFATE 2 GM/50ML IV SOLN
2.0000 g | Freq: Once | INTRAVENOUS | Status: AC
  Administered 2023-03-10: 2 g via INTRAVENOUS
  Filled 2023-03-10: qty 50

## 2023-03-10 MED ORDER — ACETAMINOPHEN 325 MG PO TABS: 650.0000 mg | ORAL_TABLET | Freq: Once | ORAL | Status: DC

## 2023-03-10 MED ORDER — CETIRIZINE HCL 10 MG PO TABS: 10.0000 mg | ORAL_TABLET | Freq: Once | ORAL | Status: DC

## 2023-03-10 MED ORDER — HEPARIN SOD (PORK) LOCK FLUSH 100 UNIT/ML IV SOLN
500.0000 [IU] | Freq: Every day | INTRAVENOUS | Status: AC | PRN
  Administered 2023-03-10: 500 [IU]

## 2023-03-10 NOTE — Progress Notes (Signed)
Patient presents today possible fluids per provider's order. Vital signs stable. Pt's potassium is 3.3, magnesium is 1.6, and hemoglobin is noted to be 7.5. Patient will receive house fluids over 2 hours and 1 unit of blood per Dr.K.  Peripheral IV started with good blood return pre and post infusion.  House fluids and 1 unit of blood given today per MD orders. Tolerated infusion without adverse affects. Vital signs stable. No complaints at this time. Discharged from clinic via wheelchair in stable condition. Alert and oriented x 3. F/U with [facility] as scheduled.

## 2023-03-10 NOTE — Patient Instructions (Signed)
[facility]  Discharge Instructions: Thank you for choosing [facility] to provide your oncology and hematology care.  If you have a lab appointment with the Cancer Center - please note that after April 8th, 2024, all labs will be drawn in the cancer center.  You do not have to check in or register with the main entrance as you have in the past but will complete your check-in in the cancer center.  Wear comfortable clothing and clothing appropriate for easy access to any Portacath or PICC line.   We strive to give you quality time with your provider. You may need to reschedule your appointment if you arrive late (15 or more minutes).  Arriving late affects you and other patients whose appointments are after yours.  Also, if you miss three or more appointments without notifying the office, you may be dismissed from the clinic at the provider's discretion.      For prescription refill requests, have your pharmacy contact our office and allow 72 hours for refills to be completed.    Today you received 2 hours of fluids and 1 unit of blood     BELOW ARE SYMPTOMS THAT SHOULD BE REPORTED IMMEDIATELY: *FEVER GREATER THAN 100.4 F (38 C) OR HIGHER *CHILLS OR SWEATING *NAUSEA AND VOMITING THAT IS NOT CONTROLLED WITH YOUR NAUSEA MEDICATION *UNUSUAL SHORTNESS OF BREATH *UNUSUAL BRUISING OR BLEEDING *URINARY PROBLEMS (pain or burning when urinating, or frequent urination) *BOWEL PROBLEMS (unusual diarrhea, constipation, pain near the anus) TENDERNESS IN MOUTH AND THROAT WITH OR WITHOUT PRESENCE OF ULCERS (sore throat, sores in mouth, or a toothache) UNUSUAL RASH, SWELLING OR PAIN  UNUSUAL VAGINAL DISCHARGE OR ITCHING   Items with * indicate a potential emergency and should be followed up as soon as possible or go to the Emergency Department if any problems should occur.  Please show the CHEMOTHERAPY ALERT CARD or IMMUNOTHERAPY ALERT CARD at check-in to the  Emergency Department and triage nurse.  Should you have questions after your visit or need to cancel or reschedule your appointment, please contact [facility] [phone]  and follow the prompts.  Office hours are 8:00 a.m. to 4:30 p.m. Monday - Friday. Please note that voicemails left after 4:00 p.m. may not be returned until the following business day.  We are closed weekends and major holidays. You have access to a nurse at all times for urgent questions. Please call the main number to the clinic [phone] and follow the prompts.  For any non-urgent questions, you may also contact your provider using MyChart. We now offer e-Visits for anyone 3 and older to request care online for non-urgent symptoms. For details visit [email or web address].   Also download the MyChart app! Go to the app store, search "MyChart", open the app, select , and log in with your MyChart username and password.

## 2023-03-10 NOTE — Progress Notes (Signed)
[facility] [address] [place] [address]    Clinic Day:  03/10/2023  Referring physician: Gwenlyn Found, MD  Patient Care Team: Gwenlyn Found, MD as PCP - General (Family Medicine) Jonelle Sidle, MD as PCP - Cardiology (Cardiology) Jena Gauss Gerrit Friends, MD as Consulting Physician (Gastroenterology) [name], MD as Medical Oncologist (Medical Oncology) Therese Sarah, RN as Oncology Nurse Navigator (Medical Oncology)   ASSESSMENT & PLAN:   Assessment: 1.  Stage IIc (T3d/4b N0 M0) rectal cancer: - Rectal bleeding for the past 4 to 5 years. - Colonoscopy (12/16/2022): Tumor protruding through the anal orifice.  Bulky semilunar lateral rectal tumor extending from the anorectal junction proximally about 13 cm. 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  MSI-high not detected by [id number] - CT CAP (12/17/2022): Locally advanced low rectal primary without bowel obstruction or nodal metastasis.  Isolated right middle lobe subpleural lung nodule most likely benign.  Cirrhosis and portal venous hypertension.  2.6 cm right renal lesion with differential hemorrhagic/proteinaceous cyst or solid neoplasm. - PET scan (12/31/2022): Markedly hypermetabolic rectal lesion with no evidence for perirectal or pelvic lymphadenopathy.  No metastatic disease in the neck, chest, abdomen or pelvis.  Focal hypermetabolism in the region of the gallbladder neck corresponds to subtle 13 mm focus of increased attenuation in the lumen of the gallbladder.  Differential includes gallbladder polyp/neoplasm.  2.7 cm exophytic posterior right interpolar renal lesion shows no substantial hypermetabolism, likely cyst.  2.3 cm inferior right thyroid nodule without hypermetabolism. - MRI pelvis (12/24/2022): Extension through muscularis propria.  Tumor size 3.8 x 3 x 5.8 cm with a volume 35 cm.  T stage is T3d, probable T4.   Suspicion of involvement of pelvic floor musculature and the far posterior and left side of the vagina.   2.  Social/family history: - She lives at home with her family.  She is accompanied by her daughter today.  She is independent of ADLs and IADLs.  She is a retired Production designer, theatre/television/film at [employer].  Non-smoker.  She reports having hysterectomy in 1986 for cancerous polyps in her endometrium. - Maternal grandmother's sister had colon cancer.   Plan: 1.  Stage IIc (T3d/4BN0M0) rectal cancer, MSI stable: - Cycle 4 chemotherapy on 03/03/2023. - She reported nausea but denied any vomiting.  She had decreased appetite since Sunday. - She has diarrhea 6-8 times per day, worse since Sunday. - She is drinking about 1 can of Ensure per day. - NovoLog and Mounjaro were stopped last week. - Reviewed labs today which showed decrease in electrolytes of potassium and magnesium.  LFTs are normal.  CBC shows leukopenia with neutropenia and thrombocytopenia consistent with chemotherapy myelosuppression. - She will receive 1 L of fluid with electrolytes.  We will also transfuse 1 unit of PRBC.  She will come back next week for her next cycle of chemotherapy.   2.  Peripheral neuropathy from diabetes: - She has no feeling in her toes.  Continue Lyrica twice daily.  No worsening since last chemo.   3.  Severe microcytic anemia: - Continue intermittent Venofer.  Hemoglobin today 7.5.  Will transfuse 1 unit PRBC.   4.  Hypokalemia: - Continue K-Lor 40 mEq twice daily.   5.  Folic acid deficiency: - Continue folic acid 1 mg tablet daily.  No orders of the defined types were placed in this encounter.    I,[name],acting as a Neurosurgeon  for [name], MD.,have documented all relevant documentation on the behalf of [name], MD,as directed by  [name], MD while in the presence of [name], MD.  I, [name] MD, have reviewed the above documentation  for accuracy and completeness, and I agree with the above.    [name], MD   4/17/20245:30 PM  CHIEF COMPLAINT:   Diagnosis: stage IIc (T4b N0) low rectal cancer    Cancer Staging  Rectal cancer Staging form: Colon and Rectum, AJCC 8th Edition - Clinical stage from 01/03/2023: Stage IIC (cT4b, cN0, cM0) - Unsigned    Prior Therapy: none  Current Therapy:  FOLFIRINOX   HISTORY OF PRESENT ILLNESS:   Oncology History  Rectal cancer  01/03/2023 Initial Diagnosis   Rectal cancer (HCC)   01/20/2023 -  Chemotherapy   Patient is on Treatment Plan : RECTAL Modified FOLFIRINOX q14d x 8 cycles        INTERVAL HISTORY:   [name] is a 67 y.o. female presenting to clinic today for follow up of stage IIc (T4b N0) low rectal cancer. She was last seen by me on 03/03/23.  Today, she states that she is doing poorly with generalized weakness and poor appetite x4 days. Her appetite level is at 40%. She reports nausea and minimal vomiting. She states that she can eat pudding and apple sauce without vomiting. She is drinking 1 Ensure a day. She is taking Zyprexa nightly. Her energy level is at 25%.  She reports compliance with Potassium Chloride solution BID. She notes that she is having x6-8 episodes of diarrhea per day. This has been ongoing with treatment but became severe x4 days ago. She is using Imodium with minimal relief- she is using all 8 doses per day. She denies any mouth sores. She states that her Greggory Keen was stopped completely as well as her Novolog last week.  PAST MEDICAL HISTORY:   Past Medical History: Past Medical History:  Diagnosis Date   Anxiety    Cancer    Coronary atherosclerosis    Cardiac CT 09/2018 with calcium score 8.7 and mild proximal LAD disease   Essential hypertension 2009   GERD (gastroesophageal reflux disease)    Hypothyroidism    Iron deficiency anemia    Osteoarthritis    Palpitations    Tachypalpitations on beta blockers since 2010    Type 2 diabetes mellitus 2003   Vitamin B 12 deficiency    Vitamin D deficiency     Surgical History: Past Surgical History:  Procedure Laterality Date   ABDOMINAL HYSTERECTOMY     History of cervical cancer   BIOPSY  12/16/2022   Procedure: BIOPSY;  Surgeon: Corbin Ade, MD;  [facility]: [facility];  Service: Endoscopy;;  gastric, rectal   BREAST BIOPSY Right    COLONOSCOPY WITH PROPOFOL N/A 12/16/2022   Procedure: COLONOSCOPY WITH PROPOFOL;  Surgeon: Corbin Ade, MD;  Location: [facility];  Service: Endoscopy;  Laterality: N/A;  10:45 am   ESOPHAGOGASTRODUODENOSCOPY (EGD) WITH PROPOFOL N/A 12/16/2022   Procedure: ESOPHAGOGASTRODUODENOSCOPY (EGD) WITH PROPOFOL;  Surgeon: Corbin Ade, MD;  Location: [facility];  Service: Endoscopy;  Laterality: N/A;   IR IMAGING GUIDED PORT INSERTION  01/15/2023   MALONEY DILATION N/A 12/16/2022   Procedure: Elease Hashimoto DILATION;  Surgeon: Corbin Ade, MD;  Location: [facility];  Service: Endoscopy;  Laterality: N/A;   POLYPECTOMY  12/16/2022   Procedure: POLYPECTOMY;  Surgeon: Corbin Ade, MD;  Location: [facility];  Service: Endoscopy;;   TONSILLECTOMY      Social History: Social History   Socioeconomic History   Marital status: Single    Spouse name: Not on file   Number of children: 2   Years of education: Not on file   Highest education level: Not on file  Occupational History   Occupation: DISABLED    Employer: UNEMPLOYED    Comment: [employer]  Tobacco Use   Smoking status: Never   Smokeless tobacco: Never  Vaping Use   Vaping Use: Never used  Substance and Sexual Activity   Alcohol use: No   Drug use: No   Sexual activity: Never  Other Topics Concern   Not on file  Social History Narrative   Has 2 grandchildren. 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   Social Determinants of Health   Financial Resource Strain: Not on file  Food Insecurity: No Food Insecurity (01/06/2023)    Hunger Vital Sign    Worried About Running Out of Food in the Last Year: Never true    Ran Out of Food in the Last Year: Never true  Transportation Needs: No Transportation Needs (01/06/2023)   PRAPARE - Administrator, Civil Service (Medical): No    Lack of Transportation (Non-Medical): No  Physical Activity: Not on file  Stress: Not on file  Social Connections: Not on file  Intimate Partner Violence: Not At Risk (01/06/2023)   Humiliation, Afraid, Rape, and Kick questionnaire    Fear of Current or Ex-Partner: No    Emotionally Abused: No    Physically Abused: No    Sexually Abused: No    Family History: Family History  Problem Relation Age of Onset   Heart failure Mother    Diabetes Father    Hypertension Father    Stroke Sister 33   Diabetes Brother    Cancer - Colon Other        age 26   Colon polyps Neg Hx     Current Medications:  Current Outpatient Medications:    blood glucose meter kit and supplies KIT, Dispense based on patient and insurance preference. Use up to four times daily as directed. (FOR ICD-10 E11.65) Number of Strips 400 Number of Lancets 400, Disp: 1 each, Rfl: 0   Cholecalciferol (VITAMIN D3) 5000 units TABS, Take 5,000 Units by mouth daily., Disp: , Rfl:    cyanocobalamin (VITAMIN B12) 1000 MCG/ML injection, B12 shots at a frequency of once weekly for 4 weeks then can drop down to once monthly thereafter., Disp: , Rfl:    cyclobenzaprine (FLEXERIL) 10 MG tablet, Take 10 mg by mouth 3 (three) times daily as needed for muscle spasms., Disp: , Rfl:    dextrose 5 % SOLN 1,000 mL with fluorouracil 5 GM/100ML SOLN, Inject into the vein over 48 hr. Every 14 days, Disp: , Rfl:    docusate sodium (COLACE) 100 MG capsule, Take 1 capsule (100 mg total) by mouth 2 (two) times daily. Do NOT take this if you have diarrhea.  Try to maintain soft easy to pass bowel movements., Disp: 10 capsule, Rfl: 0   DROPLET PEN NEEDLES 31G X 5 MM MISC, USE AS INSTRUCTED  TO INJECT INSULIN DAILY, Disp: 100 each, Rfl: 3   FLUOROURACIL IV, Inject into the vein every 14 (fourteen) days., Disp: , Rfl:    FLUoxetine (PROZAC) 20 MG capsule, Take 20 mg by mouth daily., Disp: , Rfl:    folic acid (  FOLVITE) 1 MG tablet, Take 1 tablet (1 mg total) by mouth daily., Disp: 30 tablet, Rfl: 6   furosemide (LASIX) 20 MG tablet, Take 1 tablet (20 mg total) by mouth daily as needed. Take extra potassium days that lasix is needed, Disp: 30 tablet, Rfl: 2   HYDROcodone-acetaminophen (NORCO) 5-325 MG tablet, Take 1 tablet by mouth every 6 (six) hours as needed for severe pain., Disp: 30 tablet, Rfl: 0   insulin glargine (LANTUS SOLOSTAR) 100 UNIT/ML Solostar Pen, Inject 40 Units into the skin at bedtime., Disp: 30 mL, Rfl: 0   IRINOTECAN HCL IV, Inject into the vein every 14 (fourteen) days., Disp: , Rfl:    LEUCOVORIN CALCIUM IV, Inject into the vein every 14 (fourteen) days., Disp: , Rfl:    levothyroxine (SYNTHROID) 88 MCG tablet, TAKE 1 TABLET EVERY DAY BEFORE BREAKFAST, Disp: 90 tablet, Rfl: 1   loperamide (IMODIUM) 2 MG capsule, TAKE 1 CAPSULE AS DIRECTED AS NEEDED FOR DIARRHEA OR LOOSE STOOLS, Disp: 30 capsule, Rfl: 11   magnesium oxide (MAG-OX) 400 (240 Mg) MG tablet, Take 1 tablet (400 mg total) by mouth 2 (two) times daily., Disp: 60 tablet, Rfl: 2   megestrol (MEGACE) 400 MG/10ML suspension, Take 10 mLs (400 mg total) by mouth daily., Disp: 240 mL, Rfl: 0   OLANZapine (ZYPREXA) 5 MG tablet, Take 1 tablet (5 mg total) by mouth at bedtime., Disp: 30 tablet, Rfl: 2   ondansetron (ZOFRAN-ODT) 4 MG disintegrating tablet, Take 2 tablets (8 mg total) by mouth every 8 (eight) hours as needed for refractory nausea / vomiting. Take in between doses of Compazine to maximize symptom relief., Disp: 60 tablet, Rfl: 1   OXALIPLATIN IV, Inject into the vein every 14 (fourteen) days., Disp: , Rfl:    pantoprazole (PROTONIX) 40 MG tablet, Take 1 tablet (40 mg total) by mouth 2 (two) times daily  before a meal., Disp: 60 tablet, Rfl: 3   Potassium Chloride 40 MEQ/15ML (20%) SOLN, Take 15 mLs by mouth in the morning and at bedtime., Disp: 900 mL, Rfl: 2   pregabalin (LYRICA) 100 MG capsule, Take 1 capsule (100 mg total) by mouth 2 (two) times daily., Disp: 180 capsule, Rfl: 1   prochlorperazine (COMPAZINE) 10 MG tablet, Take 1 tablet (10 mg total) by mouth every 6 (six) hours as needed for nausea or vomiting., Disp: 30 tablet, Rfl: 3   sucralfate (CARAFATE) 1 GM/10ML suspension, Take 10 mLs (1 g total) by mouth 4 (four) times daily. Swish and swallow, Disp: 420 mL, Rfl: 2   TRUE METRIX BLOOD GLUCOSE TEST test strip, TEST BLOOD SUGAR TWICE DAILY, Disp: 200 strip, Rfl: 3 No current facility-administered medications for this visit.  Facility-Administered Medications Ordered in Other Visits:    0.9 %  sodium chloride infusion, , Intravenous, Continuous, [name], MD, Stopped at 02/17/23 1535   acetaminophen (TYLENOL) tablet 650 mg, 650 mg, Oral, Once, [name], MD   cetirizine (ZYRTEC) tablet 10 mg, 10 mg, Oral, Once, [name], MD   Allergies: Allergies  Allergen Reactions   Contrast Media [Iodinated Contrast Media] Other (See Comments)    Burning   Sulfa Antibiotics Nausea And Vomiting    Other reaction(s): GI Upset (intolerance) unknown   Doxepin Rash and Swelling   Tape Rash    REVIEW OF SYSTEMS:   Review of Systems  Constitutional:  Positive for appetite change and fatigue. Negative for chills and fever.  HENT:   Positive for trouble swallowing. Negative for lump/mass, mouth  sores, nosebleeds and sore throat.   Eyes:  Negative for eye problems.  Respiratory:  Negative for cough and shortness of breath.   Cardiovascular:  Negative for chest pain, leg swelling and palpitations.  Gastrointestinal:  Positive for diarrhea, nausea and vomiting. Negative for abdominal pain and constipation.  Genitourinary:  Negative for bladder incontinence,  difficulty urinating, dysuria, frequency, hematuria and nocturia.   Musculoskeletal:  Negative for arthralgias, back pain, flank pain, myalgias and neck pain.  Skin:  Negative for itching and rash.  Neurological:  Negative for dizziness, headaches and numbness.       + tingling in her feet  Hematological:  Does not bruise/bleed easily.  Psychiatric/Behavioral:  Positive for sleep disturbance. Negative for depression and suicidal ideas. The patient is not nervous/anxious.   All other systems reviewed and are negative.    VITALS:   There were no vitals taken for this visit.  Wt Readings from Last 3 Encounters:  03/10/23 252 lb 3.2 oz (114.4 kg)  03/05/23 265 lb (120.2 kg)  03/03/23 261 lb 14.4 oz (118.8 kg)    There is no height or weight on file to calculate BMI.  Performance status (ECOG): 1 - Symptomatic but completely ambulatory  PHYSICAL EXAM:   Physical Exam Vitals and nursing note reviewed. Exam conducted with a chaperone present.  Constitutional:      Appearance: Normal appearance.  Cardiovascular:     Rate and Rhythm: Normal rate and regular rhythm.     Pulses: Normal pulses.     Heart sounds: Normal heart sounds.  Pulmonary:     Effort: Pulmonary effort is normal.     Breath sounds: Normal breath sounds.  Abdominal:     Palpations: Abdomen is soft. There is no hepatomegaly, splenomegaly or mass.     Tenderness: There is no abdominal tenderness.  Musculoskeletal:     Right lower leg: No edema.     Left lower leg: No edema.  Lymphadenopathy:     Cervical: No cervical adenopathy.     Right cervical: No superficial, deep or posterior cervical adenopathy.    Left cervical: No superficial, deep or posterior cervical adenopathy.     Upper Body:     Right upper body: No supraclavicular or axillary adenopathy.     Left upper body: No supraclavicular or axillary adenopathy.  Neurological:     General: No focal deficit present.     Mental Status: She is alert and  oriented to person, place, and time.  Psychiatric:        Mood and Affect: Mood normal.        Behavior: Behavior normal.     LABS:      Latest Ref Rng & Units 03/10/2023    8:08 AM 03/03/2023    8:21 AM 03/01/2023    8:20 AM  CBC  WBC 4.0 - 10.5 K/uL 1.9  13.5  2.8   Hemoglobin 12.0 - 15.0 g/dL 7.5  9.5  9.6   Hematocrit 36.0 - 46.0 % 23.6  30.0  29.4   Platelets 150 - 400 K/uL 86  136  120       Latest Ref Rng & Units 03/10/2023    8:08 AM 03/03/2023    8:21 AM 03/01/2023    8:20 AM  CMP  Glucose 70 - 99 mg/dL 161  096  045   BUN 8 - 23 mg/dL 10  <5  <5   Creatinine 0.44 - 1.00 mg/dL 4.09  8.11  9.14  Sodium 135 - 145 mmol/L 138  138  138   Potassium 3.5 - 5.1 mmol/L 3.3  2.9  2.8   Chloride 98 - 111 mmol/L 105  104  101   CO2 22 - 32 mmol/L 24  26  27    Calcium 8.9 - 10.3 mg/dL 8.2  8.2  8.2   Total Protein 6.5 - 8.1 g/dL 5.6  5.6  5.7   Total Bilirubin 0.3 - 1.2 mg/dL 0.8  1.1  0.8   Alkaline Phos 38 - 126 U/L 78  97  79   AST 15 - 41 U/L 32  25  22   ALT 0 - 44 U/L 16  15  13       Lab Results  Component Value Date   CEA1 3.8 12/16/2022   /  CEA  Date Value Ref Range Status  12/16/2022 3.8 0.0 - 4.7 ng/mL Final    Comment:    (NOTE)                             Nonsmokers          <3.9                             Smokers             <5.6 [employer] Electrochemiluminescence Immunoassay (ECLIA) Values obtained with different assay methods or kits cannot be used interchangeably.  Results cannot be interpreted as absolute evidence of the presence or absence of malignant disease. Performed At: [facility] [address] [place], [place] [id number] Jolene Schimke MD WU:[id number]    No results found for: "PSA1" No results found for: "[id number]" No results found for: "CAN125"  No results found for: "TOTALPROTELP", "ALBUMINELP", "A1GS", "A2GS", "BETS", "BETA2SER", "GAMS", "MSPIKE", "SPEI" Lab Results  Component Value Date   TIBC 370 01/04/2023    FERRITIN 4 (L) 01/04/2023   IRONPCTSAT 4 (L) 01/04/2023   Lab Results  Component Value Date   LDH 136 01/04/2023     STUDIES:   CT ABDOMEN PELVIS WO CONTRAST  Result Date: 02/21/2023 CLINICAL DATA:  Emesis, bowel obstruction, rectal cancer, chemotherapy * Tracking Code: BO * EXAM: CT ABDOMEN AND PELVIS WITHOUT CONTRAST TECHNIQUE: Multidetector CT imaging of the abdomen and pelvis was performed following the standard protocol without IV contrast. RADIATION DOSE REDUCTION: This exam was performed according to the departmental dose-optimization program which includes automated exposure control, adjustment of the mA and/or kV according to patient size and/or use of iterative reconstruction technique. COMPARISON:  PET-CT, 12/31/2022 FINDINGS: Lower chest: No acute abnormality. Left coronary artery calcifications. Hepatobiliary: No solid liver abnormality is seen. Coarse contour of the faintly calcified gallstones. No gallbladder wall thickening, or biliary dilatation. Pancreas: Unremarkable. No pancreatic ductal dilatation or surrounding inflammatory changes. Spleen: Normal in size without significant abnormality. Adrenals/Urinary Tract: Adrenal glands are unremarkable. Unchanged partially exophytic lesion of the posterior midportion of the right kidney measuring 2.9 x 2.6 cm (series 3, image 41). Bladder is unremarkable. Stomach/Bowel: Stomach is within normal limits. Appendix is not clearly visualized. Unchanged left eccentric rectal mass (series 3, image 95). Colon is fluid-filled to the rectum. Sigmoid diverticulosis. Mild wall thickening of the sigmoid colon (series 3, image 82). No evidence of bowel obstruction. Vascular/Lymphatic: Scattered aortic atherosclerosis. No enlarged abdominal or pelvic lymph nodes. Reproductive: Status post hysterectomy. Other: Small, fat containing right inguinal hernia.  No ascites. Musculoskeletal: No acute or significant osseous findings. IMPRESSION: 1. Colon is  fluid-filled to the rectum, consistent with diarrheal illness. Sigmoid diverticulosis with mild wall thickening of the sigmoid colon, suggesting mild diverticulitis. 2. No evidence of bowel obstruction. 3. Unchanged rectal mass. 4. 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 Nonemergent, outpatient MRI may be helpful to more definitively characterize this lesion if desired in the setting of known primary rectal malignancy. 5. Cholelithiasis. 6. Coronary artery disease. Aortic Atherosclerosis (ICD10-I70.0). Electronically Signed   By: Jearld Lesch M.D.   On: 02/21/2023 15:07   DG Chest Portable 1 View  Result Date: 02/21/2023 CLINICAL DATA:  Weakness. On active chemotherapy. EXAM: PORTABLE CHEST 1 VIEW COMPARISON:  Radiograph 06/27/2021. CT 12/17/2022 FINDINGS: Right chest port with tip overlying the jugular caval junction. 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 Overall low lung volumes which limits assessment. No obvious focal airspace disease, pleural effusion or pneumothorax. IMPRESSION: Low lung volumes limit assessment. 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 Allowing for limitations, no acute findings Electronically Signed   By: Narda Rutherford M.D.   On: 02/21/2023 14:25

## 2023-03-10 NOTE — Progress Notes (Signed)
Pharmacy has substituted cetirizine 10 mg orally x 1 as premedication for blood as requested by patient.  Loratidine discontinued.  V.O. Dr Carilyn Goodpasture, PharmD

## 2023-03-11 ENCOUNTER — Ambulatory Visit: Payer: Medicare HMO

## 2023-03-11 ENCOUNTER — Other Ambulatory Visit: Payer: Medicare HMO

## 2023-03-11 ENCOUNTER — Telehealth: Payer: Self-pay | Admitting: Dietician

## 2023-03-11 ENCOUNTER — Other Ambulatory Visit: Payer: Self-pay | Admitting: Nurse Practitioner

## 2023-03-11 ENCOUNTER — Inpatient Hospital Stay: Payer: Medicare HMO | Admitting: Dietician

## 2023-03-11 ENCOUNTER — Ambulatory Visit: Payer: Medicare HMO | Admitting: Hematology

## 2023-03-11 LAB — BPAM RBC
ISSUE DATE / TIME: 202404171101
Unit Type and Rh: 6200

## 2023-03-11 LAB — TYPE AND SCREEN
Antibody Screen: NEGATIVE
Unit division: 0

## 2023-03-11 NOTE — Telephone Encounter (Incomplete)
Attempted to contact pt via telephone for nutrition follow-up. Patient did not answer. Unable to leave message. Will continue efforts to reach patient as able.

## 2023-03-15 ENCOUNTER — Other Ambulatory Visit: Payer: Self-pay | Admitting: Hematology

## 2023-03-15 ENCOUNTER — Other Ambulatory Visit: Payer: Self-pay | Admitting: Physician Assistant

## 2023-03-15 DIAGNOSIS — T451X5A Adverse effect of antineoplastic and immunosuppressive drugs, initial encounter: Secondary | ICD-10-CM

## 2023-03-16 ENCOUNTER — Encounter: Payer: Self-pay | Admitting: Hematology

## 2023-03-16 NOTE — Progress Notes (Signed)
[facility] [address], [place] [address]    Clinic Day:  03/17/2023  Referring physician: Gwenlyn Found, MD  Patient Care Team: Gwenlyn Found, MD as PCP - General (Family Medicine) Jonelle Sidle, MD as PCP - Cardiology (Cardiology) Jena Gauss Gerrit Friends, MD as Consulting Physician (Gastroenterology) [name], MD as Medical Oncologist (Medical Oncology) Therese Sarah, RN as Oncology Nurse Navigator (Medical Oncology)   ASSESSMENT & PLAN:   Assessment: 1.  Stage IIc (T3d/4b N0 M0) rectal cancer: - Rectal bleeding for the past 4 to 5 years. - Colonoscopy (12/16/2022): Tumor protruding through the anal orifice.  Bulky semilunar lateral rectal tumor extending from the anorectal junction proximally about 13 cm. 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  MSI-high not detected by [id number] - CT CAP (12/17/2022): Locally advanced low rectal primary without bowel obstruction or nodal metastasis.  Isolated right middle lobe subpleural lung nodule most likely benign.  Cirrhosis and portal venous hypertension.  2.6 cm right renal lesion with differential hemorrhagic/proteinaceous cyst or solid neoplasm. - PET scan (12/31/2022): Markedly hypermetabolic rectal lesion with no evidence for perirectal or pelvic lymphadenopathy.  No metastatic disease in the neck, chest, abdomen or pelvis.  Focal hypermetabolism in the region of the gallbladder neck corresponds to subtle 13 mm focus of increased attenuation in the lumen of the gallbladder.  Differential includes gallbladder polyp/neoplasm.  2.7 cm exophytic posterior right interpolar renal lesion shows no substantial hypermetabolism, likely cyst.  2.3 cm inferior right thyroid nodule without hypermetabolism. - MRI pelvis (12/24/2022): Extension through muscularis propria.  Tumor size 3.8 x 3 x 5.8 cm with a volume 35 cm.  T stage is T3d, probable T4.   Suspicion of involvement of pelvic floor musculature and the far posterior and left side of the vagina.   2.  Social/family history: - She lives at home with her family.  She is accompanied by her daughter today.  She is independent of ADLs and IADLs.  She is a retired Production designer, theatre/television/film at [employer].  Non-smoker.  She reports having hysterectomy in 1986 for cancerous polyps in her endometrium. - Maternal grandmother's sister had colon cancer.    Plan: 1.  Stage IIc (T3d/4BN0M0) rectal cancer, MSI stable: - She has tolerated last cycle of chemotherapy reasonably well. - She has diarrhea 4-6 times per day.  Controlled with Imodium.  She has mild fatigue. - Rectal bleeding has stopped after cycle 2. - Labs today: Normal LFTs and creatinine.  CBC grossly normal with mild thrombocytopenia. - Proceed with cycle 5 today without any dose modifications. - We will arrange for fluids twice weekly next week on Monday as well as Wednesday. - I will add atropine to premeds.   2.  Peripheral neuropathy from diabetes: - She has no feeling in her toes.  Continue Lyrica twice daily.  No worsening.   3.  Severe microcytic anemia: - Rectal bleeding stopped after cycle 2.  Last Venofer on 02/23/2023.  Hemoglobin today stable at 8.9.   4.  Hypokalemia: - Continue K-Lor 40 mEq twice daily.  Today she will receive 1 more dose of potassium.   5.  Hypomagnesemia: - Magnesium is severely low.  She will receive IV magnesium.  Will increase her magnesium to twice daily.   No orders of the defined types were placed in this encounter.     I,Katie Daubenspeck,acting as a Neurosurgeon for [name], MD.,have documented all relevant  documentation on the behalf of [name], MD,as directed by  [name], MD while in the presence of [name], MD.   I, [name] MD, have reviewed the above documentation for accuracy and completeness, and I agree with the  above.   [name], MD   4/24/20246:01 PM  CHIEF COMPLAINT:   Diagnosis: stage IIc (T4b N0) low rectal cancer    Cancer Staging  Rectal cancer Staging form: Colon and Rectum, AJCC 8th Edition - Clinical stage from 01/03/2023: Stage IIC (cT4b, cN0, cM0) - Unsigned    Prior Therapy: none  Current Therapy:  FOLFIRINOX    HISTORY OF PRESENT ILLNESS:   Oncology History  Rectal cancer  01/03/2023 Initial Diagnosis   Rectal cancer (HCC)   01/20/2023 -  Chemotherapy   Patient is on Treatment Plan : RECTAL Modified FOLFIRINOX q14d x 8 cycles        INTERVAL HISTORY:   Jayleen is a 67 y.o. female presenting to clinic today for follow up of stage IIc (T4b N0) low rectal cancer. She was last seen by me on 03/10/23.  Today, she states that she is doing well overall. Her appetite level is at 70%. Her energy level is at 50%.  PAST MEDICAL HISTORY:   Past Medical History: Past Medical History:  Diagnosis Date   Anxiety    Cancer    Coronary atherosclerosis    Cardiac CT 09/2018 with calcium score 8.7 and mild proximal LAD disease   Essential hypertension 2009   GERD (gastroesophageal reflux disease)    Hypothyroidism    Iron deficiency anemia    Osteoarthritis    Palpitations    Tachypalpitations on beta blockers since 2010   Type 2 diabetes mellitus 2003   Vitamin B 12 deficiency    Vitamin D deficiency     Surgical History: Past Surgical History:  Procedure Laterality Date   ABDOMINAL HYSTERECTOMY     History of cervical cancer   BIOPSY  12/16/2022   Procedure: BIOPSY;  Surgeon: Corbin Ade, MD;  [facility]: [facility];  Service: Endoscopy;;  gastric, rectal   BREAST BIOPSY Right    COLONOSCOPY WITH PROPOFOL N/A 12/16/2022   Procedure: COLONOSCOPY WITH PROPOFOL;  Surgeon: Corbin Ade, MD;  Location: [facility];  Service: Endoscopy;  Laterality: N/A;  10:45 am   ESOPHAGOGASTRODUODENOSCOPY (EGD) WITH PROPOFOL N/A 12/16/2022   Procedure:  ESOPHAGOGASTRODUODENOSCOPY (EGD) WITH PROPOFOL;  Surgeon: Corbin Ade, MD;  [facility]: [facility];  Service: Endoscopy;  Laterality: N/A;   IR IMAGING GUIDED PORT INSERTION  01/15/2023   MALONEY DILATION N/A 12/16/2022   Procedure: Elease Hashimoto DILATION;  Surgeon: Corbin Ade, MD;  Location: [facility];  Service: Endoscopy;  Laterality: N/A;   POLYPECTOMY  12/16/2022   Procedure: POLYPECTOMY;  Surgeon: Corbin Ade, MD;  [facility]: [facility];  Service: Endoscopy;;   TONSILLECTOMY      Social History: Social History   Socioeconomic History   Marital status: Single    Spouse name: Not on file   Number of children: 2   Years of education: Not on file   Highest education level: Not on file  Occupational History   Occupation: DISABLED    Employer: UNEMPLOYED    Comment: [employer]  Tobacco Use   Smoking status: Never   Smokeless tobacco: Never  Vaping Use   Vaping Use: Never used  Substance and Sexual Activity   Alcohol use: No   Drug use: No  Sexual activity: Never  Other Topics Concern   Not on file  Social History Narrative   Has 2 grandchildren. 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   Social Determinants of Health   Financial Resource Strain: Not on file  Food Insecurity: No Food Insecurity (01/06/2023)   Hunger Vital Sign    Worried About Running Out of Food in the Last Year: Never true    Ran Out of Food in the Last Year: Never true  Transportation Needs: No Transportation Needs (01/06/2023)   PRAPARE - Administrator, Civil Service (Medical): No    Lack of Transportation (Non-Medical): No  Physical Activity: Not on file  Stress: Not on file  Social Connections: Not on file  Intimate Partner Violence: Not At Risk (01/06/2023)   Humiliation, Afraid, Rape, and Kick questionnaire    Fear of Current or Ex-Partner: No    Emotionally Abused: No    Physically Abused: No    Sexually Abused: No    Family History: Family  History  Problem Relation Age of Onset   Heart failure Mother    Diabetes Father    Hypertension Father    Stroke Sister 65   Diabetes Brother    Cancer - Colon Other        age 16   Colon polyps Neg Hx     Current Medications:  Current Outpatient Medications:    blood glucose meter kit and supplies KIT, Dispense based on patient and insurance preference. Use up to four times daily as directed. (FOR ICD-10 E11.65) Number of Strips 400 Number of Lancets 400, Disp: 1 each, Rfl: 0   Cholecalciferol (VITAMIN D3) 5000 units TABS, Take 5,000 Units by mouth daily., Disp: , Rfl:    cyanocobalamin (VITAMIN B12) 1000 MCG/ML injection, B12 shots at a frequency of once weekly for 4 weeks then can drop down to once monthly thereafter., Disp: , Rfl:    cyclobenzaprine (FLEXERIL) 10 MG tablet, Take 10 mg by mouth 3 (three) times daily as needed for muscle spasms., Disp: , Rfl:    dextrose 5 % SOLN 1,000 mL with fluorouracil 5 GM/100ML SOLN, Inject into the vein over 48 hr. Every 14 days, Disp: , Rfl:    docusate sodium (COLACE) 100 MG capsule, Take 1 capsule (100 mg total) by mouth 2 (two) times daily. Do NOT take this if you have diarrhea.  Try to maintain soft easy to pass bowel movements., Disp: 10 capsule, Rfl: 0   DROPLET PEN NEEDLES 31G X 5 MM MISC, USE AS INSTRUCTED TO INJECT INSULIN DAILY, Disp: 100 each, Rfl: 3   FLUOROURACIL IV, Inject into the vein every 14 (fourteen) days., Disp: , Rfl:    FLUoxetine (PROZAC) 20 MG capsule, Take 20 mg by mouth daily., Disp: , Rfl:    folic acid (FOLVITE) 1 MG tablet, Take 1 tablet (1 mg total) by mouth daily., Disp: 30 tablet, Rfl: 6   furosemide (LASIX) 20 MG tablet, Take 1 tablet (20 mg total) by mouth daily as needed. Take extra potassium days that lasix is needed, Disp: 30 tablet, Rfl: 2   HYDROcodone-acetaminophen (NORCO) 5-325 MG tablet, Take 1 tablet by mouth every 6 (six) hours as needed for severe pain., Disp: 30 tablet, Rfl: 0   insulin glargine  (LANTUS SOLOSTAR) 100 UNIT/ML Solostar Pen, Inject 40 Units into the skin at bedtime., Disp: 45 mL, Rfl: 0   IRINOTECAN HCL IV, Inject into the vein every 14 (fourteen) days.,  Disp: , Rfl:    LEUCOVORIN CALCIUM IV, Inject into the vein every 14 (fourteen) days., Disp: , Rfl:    levothyroxine (SYNTHROID) 88 MCG tablet, TAKE 1 TABLET EVERY DAY BEFORE BREAKFAST, Disp: 90 tablet, Rfl: 1   loperamide (IMODIUM) 2 MG capsule, TAKE 1 CAPSULE AS DIRECTED AS NEEDED FOR DIARRHEA OR LOOSE STOOLS, Disp: 30 capsule, Rfl: 11   magnesium oxide (MAG-OX) 400 (240 Mg) MG tablet, Take 1 tablet (400 mg total) by mouth 2 (two) times daily., Disp: 60 tablet, Rfl: 2   megestrol (MEGACE) 400 MG/10ML suspension, Take 10 mLs (400 mg total) by mouth daily., Disp: 240 mL, Rfl: 0   OLANZapine (ZYPREXA) 5 MG tablet, Take 1 tablet (5 mg total) by mouth at bedtime., Disp: 30 tablet, Rfl: 2   ondansetron (ZOFRAN-ODT) 4 MG disintegrating tablet, TAKE 2 TABS EVERY 8 HRS AS NEEDED FOR REFRACTORY NAUSEA/ VOMITING (BETWEEN COMPAZINE DOSES FOR MAX SYMPTOM RELIEF), Disp: 60 tablet, Rfl: 1   OXALIPLATIN IV, Inject into the vein every 14 (fourteen) days., Disp: , Rfl:    pantoprazole (PROTONIX) 40 MG tablet, Take 1 tablet (40 mg total) by mouth 2 (two) times daily before a meal., Disp: 60 tablet, Rfl: 3   Potassium Chloride 40 MEQ/15ML (20%) SOLN, Take 15 mLs by mouth in the morning and at bedtime., Disp: 900 mL, Rfl: 2   pregabalin (LYRICA) 100 MG capsule, Take 1 capsule (100 mg total) by mouth 2 (two) times daily., Disp: 180 capsule, Rfl: 1   prochlorperazine (COMPAZINE) 10 MG tablet, TAKE 1 TABLET EVERY 6 HOURS AS NEEDED FOR NAUSEA OR VOMITING, Disp: 30 tablet, Rfl: 3   sucralfate (CARAFATE) 1 GM/10ML suspension, Take 10 mLs (1 g total) by mouth 4 (four) times daily. Swish and swallow, Disp: 420 mL, Rfl: 2   TRUE METRIX BLOOD GLUCOSE TEST test strip, TEST BLOOD SUGAR TWICE DAILY, Disp: 200 strip, Rfl: 3 No current facility-administered  medications for this visit.  Facility-Administered Medications Ordered in Other Visits:    0.9 %  sodium chloride infusion, , Intravenous, Continuous, [name], MD, Stopped at 02/17/23 1535   fluorouracil (ADRUCIL) 5,000 mg in sodium chloride 0.9 % 150 mL chemo infusion, 1,920 mg/m2 (Order-Specific), Intravenous, 1 day or 1 dose, [name], MD, Infusion Verify at 03/17/23 1618   Allergies: Allergies  Allergen Reactions   Contrast Media [Iodinated Contrast Media] Other (See Comments)    Burning   Sulfa Antibiotics Nausea And Vomiting    Other reaction(s): GI Upset (intolerance) unknown   Doxepin Rash and Swelling   Tape Rash    REVIEW OF SYSTEMS:   Review of Systems  Constitutional:  Negative for chills, fatigue and fever.  HENT:   Negative for lump/mass, mouth sores, nosebleeds, sore throat and trouble swallowing.   Eyes:  Negative for eye problems.  Respiratory:  Negative for cough and shortness of breath.   Cardiovascular:  Negative for chest pain, leg swelling and palpitations.  Gastrointestinal:  Positive for diarrhea and nausea. Negative for abdominal pain, constipation and vomiting.  Genitourinary:  Negative for bladder incontinence, difficulty urinating, dysuria, frequency, hematuria and nocturia.   Musculoskeletal:  Negative for arthralgias, back pain, flank pain, myalgias and neck pain.  Skin:  Negative for itching and rash.  Neurological:  Positive for numbness. Negative for dizziness and headaches.  Hematological:  Does not bruise/bleed easily.  Psychiatric/Behavioral:  Negative for depression, sleep disturbance and suicidal ideas. The patient is not nervous/anxious.   All other systems reviewed and are negative.  VITALS:   There were no vitals taken for this visit.  Wt Readings from Last 3 Encounters:  03/17/23 253 lb 4.9 oz (114.9 kg)  03/10/23 252 lb 3.2 oz (114.4 kg)  03/05/23 265 lb (120.2 kg)    There is no height or weight on file  to calculate BMI.  Performance status (ECOG): 1 - Symptomatic but completely ambulatory  PHYSICAL EXAM:   Physical Exam Vitals and nursing note reviewed. Exam conducted with a chaperone present.  Constitutional:      Appearance: Normal appearance.  Cardiovascular:     Rate and Rhythm: Normal rate and regular rhythm.     Pulses: Normal pulses.     Heart sounds: Normal heart sounds.  Pulmonary:     Effort: Pulmonary effort is normal.     Breath sounds: Normal breath sounds.  Abdominal:     Palpations: Abdomen is soft. There is no hepatomegaly, splenomegaly or mass.     Tenderness: There is no abdominal tenderness.  Musculoskeletal:     Right lower leg: No edema.     Left lower leg: No edema.  Lymphadenopathy:     Cervical: No cervical adenopathy.     Right cervical: No superficial, deep or posterior cervical adenopathy.    Left cervical: No superficial, deep or posterior cervical adenopathy.     Upper Body:     Right upper body: No supraclavicular or axillary adenopathy.     Left upper body: No supraclavicular or axillary adenopathy.  Neurological:     General: No focal deficit present.     Mental Status: She is alert and oriented to person, place, and time.  Psychiatric:        Mood and Affect: Mood normal.        Behavior: Behavior normal.     LABS:      Latest Ref Rng & Units 03/17/2023    8:24 AM 03/10/2023    8:08 AM 03/03/2023    8:21 AM  CBC  WBC 4.0 - 10.5 K/uL 4.5  1.9  13.5   Hemoglobin 12.0 - 15.0 g/dL 8.9  7.5  9.5   Hematocrit 36.0 - 46.0 % 27.8  23.6  30.0   Platelets 150 - 400 K/uL 120  86  136       Latest Ref Rng & Units 03/17/2023    8:24 AM 03/10/2023    8:08 AM 03/03/2023    8:21 AM  CMP  Glucose 70 - 99 mg/dL 829  562  130   BUN 8 - 23 mg/dL <5  10  <5   Creatinine 0.44 - 1.00 mg/dL 8.65  7.84  6.96   Sodium 135 - 145 mmol/L 137  138  138   Potassium 3.5 - 5.1 mmol/L 3.2  3.3  2.9   Chloride 98 - 111 mmol/L 106  105  104   CO2 22 - 32  mmol/L Calcium 8.9 - 10.3 mg/dL 8.0  8.2  8.2   Total Protein 6.5 - 8.1 g/dL 5.5  5.6  5.6   Total Bilirubin 0.3 - 1.2 mg/dL 0.7  0.8  1.1   Alkaline Phos 38 - 126 U/L 88  78  97   AST 15 - 41 U/L 27  32  25   ALT 0 - 44 U/L Lab Results  Component Value Date   CEA1 3.8 12/16/2022   /  CEA  Date Value Ref Range Status  12/16/2022 3.8 0.0 - 4.7 ng/mL Final    Comment:    (NOTE)                             Nonsmokers          <3.9                             Smokers             <5.6 [employer] Electrochemiluminescence Immunoassay (ECLIA) Values obtained with different assay methods or kits cannot be used interchangeably.  Results cannot be interpreted as absolute evidence of the presence or absence of malignant disease. Performed At: [facility] [address] [place], [place] [id number] Jolene Schimke MD WU:[id number]    No results found for: "PSA1" No results found for: "[phone]" No results found for: "CAN125"  No results found for: "TOTALPROTELP", "ALBUMINELP", "A1GS", "A2GS", "BETS", "BETA2SER", "GAMS", "MSPIKE", "SPEI" Lab Results  Component Value Date   TIBC 370 01/04/2023   FERRITIN 4 (L) 01/04/2023   IRONPCTSAT 4 (L) 01/04/2023   Lab Results  Component Value Date   LDH 136 01/04/2023     STUDIES:   CT ABDOMEN PELVIS WO CONTRAST  Result Date: 02/21/2023 CLINICAL DATA:  Emesis, bowel obstruction, rectal cancer, chemotherapy * Tracking Code: BO * EXAM: CT ABDOMEN AND PELVIS WITHOUT CONTRAST TECHNIQUE: Multidetector CT imaging of the abdomen and pelvis was performed following the standard protocol without IV contrast. RADIATION DOSE REDUCTION: This exam was performed according to the departmental dose-optimization program which includes automated exposure control, adjustment of the mA and/or kV according to patient size and/or use of iterative reconstruction technique. COMPARISON:  PET-CT, 12/31/2022 FINDINGS: Lower  chest: No acute abnormality. Left coronary artery calcifications. Hepatobiliary: No solid liver abnormality is seen. Coarse contour of the faintly calcified gallstones. No gallbladder wall thickening, or biliary dilatation. Pancreas: Unremarkable. No pancreatic ductal dilatation or surrounding inflammatory changes. Spleen: Normal in size without significant abnormality. Adrenals/Urinary Tract: Adrenal glands are unremarkable. Unchanged partially exophytic lesion of the posterior midportion of the right kidney measuring 2.9 x 2.6 cm (series 3, image 41). Bladder is unremarkable. Stomach/Bowel: Stomach is within normal limits. Appendix is not clearly visualized. Unchanged left eccentric rectal mass (series 3, image 95). Colon is fluid-filled to the rectum. Sigmoid diverticulosis. Mild wall thickening of the sigmoid colon (series 3, image 82). No evidence of bowel obstruction. Vascular/Lymphatic: Scattered aortic atherosclerosis. No enlarged abdominal or pelvic lymph nodes. Reproductive: Status post hysterectomy. Other: Small, fat containing right inguinal hernia.  No ascites. Musculoskeletal: No acute or significant osseous findings. IMPRESSION: 1. Colon is fluid-filled to the rectum, consistent with diarrheal illness. Sigmoid diverticulosis with mild wall thickening of the sigmoid colon, suggesting mild diverticulitis. 2. No evidence of bowel obstruction. 3. Unchanged rectal mass. 4. 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 Nonemergent, outpatient MRI may be helpful to more definitively characterize this lesion if desired in the setting of known primary rectal malignancy. 5. Cholelithiasis. 6. Coronary artery disease. Aortic Atherosclerosis (ICD10-I70.0). Electronically Signed   By: Jearld Lesch M.D.   On: 02/21/2023 15:07   DG Chest Portable 1 View  Result Date:  02/21/2023 CLINICAL DATA:  Weakness. On active chemotherapy. EXAM: PORTABLE CHEST 1 VIEW COMPARISON:  Radiograph 06/27/2021. CT 12/17/2022 FINDINGS: Right chest port with tip overlying the jugular caval junction. 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 Overall low lung volumes which limits assessment. No obvious focal airspace disease, pleural effusion or pneumothorax. IMPRESSION: Low lung volumes limit assessment. 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 Allowing for limitations, no acute findings Electronically Signed   By: Narda Rutherford M.D.   On: 02/21/2023 14:25

## 2023-03-17 ENCOUNTER — Inpatient Hospital Stay: Payer: Medicare HMO

## 2023-03-17 ENCOUNTER — Inpatient Hospital Stay: Payer: Medicare HMO | Admitting: Hematology

## 2023-03-17 ENCOUNTER — Other Ambulatory Visit: Payer: Self-pay

## 2023-03-17 VITALS — BP 132/63 | HR 73 | Temp 98.3°F | Resp 18

## 2023-03-17 VITALS — BP 124/75 | HR 88 | Temp 98.0°F | Resp 18 | Wt 253.3 lb

## 2023-03-17 DIAGNOSIS — C20 Malignant neoplasm of rectum: Secondary | ICD-10-CM

## 2023-03-17 DIAGNOSIS — E876 Hypokalemia: Secondary | ICD-10-CM

## 2023-03-17 DIAGNOSIS — Z5111 Encounter for antineoplastic chemotherapy: Secondary | ICD-10-CM | POA: Diagnosis not present

## 2023-03-17 DIAGNOSIS — Z95828 Presence of other vascular implants and grafts: Secondary | ICD-10-CM

## 2023-03-17 LAB — CBC WITH DIFFERENTIAL/PLATELET
Abs Immature Granulocytes: 0 10*3/uL (ref 0.00–0.07)
Basophils Absolute: 0 10*3/uL (ref 0.0–0.1)
Basophils Relative: 0 %
Eosinophils Absolute: 0 10*3/uL (ref 0.0–0.5)
Eosinophils Relative: 0 %
HCT: 27.8 % — ABNORMAL LOW (ref 36.0–46.0)
Hemoglobin: 8.9 g/dL — ABNORMAL LOW (ref 12.0–15.0)
Lymphocytes Relative: 24 %
Lymphs Abs: 1.1 10*3/uL (ref 0.7–4.0)
MCH: 28.4 pg (ref 26.0–34.0)
MCHC: 32 g/dL (ref 30.0–36.0)
MCV: 88.8 fL (ref 80.0–100.0)
Monocytes Absolute: 0.3 10*3/uL (ref 0.1–1.0)
Monocytes Relative: 7 %
Myelocytes: 1 %
Neutro Abs: 3.1 10*3/uL (ref 1.7–7.7)
Neutrophils Relative %: 68 %
Platelets: 120 10*3/uL — ABNORMAL LOW (ref 150–400)
RBC: 3.13 MIL/uL — ABNORMAL LOW (ref 3.87–5.11)
RDW: 24.2 % — ABNORMAL HIGH (ref 11.5–15.5)
WBC: 4.5 10*3/uL (ref 4.0–10.5)
nRBC: 1 /100 WBC — ABNORMAL HIGH
nRBC: 2 % — ABNORMAL HIGH (ref 0.0–0.2)

## 2023-03-17 LAB — COMPREHENSIVE METABOLIC PANEL
ALT: 14 U/L (ref 0–44)
AST: 27 U/L (ref 15–41)
Albumin: 2.7 g/dL — ABNORMAL LOW (ref 3.5–5.0)
Alkaline Phosphatase: 88 U/L (ref 38–126)
Anion gap: 9 (ref 5–15)
BUN: <5 mg/dL — ABNORMAL LOW (ref 8–23)
CO2: 22 mmol/L (ref 22–32)
Calcium: 8 mg/dL — ABNORMAL LOW (ref 8.9–10.3)
Chloride: 106 mmol/L (ref 98–111)
Creatinine, Ser: 0.84 mg/dL (ref 0.44–1.00)
GFR, Estimated: >60 mL/min (ref >60)
Glucose, Bld: 167 mg/dL — ABNORMAL HIGH (ref 70–99)
Potassium: 3.2 mmol/L — ABNORMAL LOW (ref 3.5–5.1)
Sodium: 137 mmol/L (ref 135–145)
Total Bilirubin: 0.7 mg/dL (ref 0.3–1.2)
Total Protein: 5.5 g/dL — ABNORMAL LOW (ref 6.5–8.1)

## 2023-03-17 LAB — SAMPLE TO BLOOD BANK

## 2023-03-17 LAB — MAGNESIUM: Magnesium: 1.2 mg/dL — ABNORMAL LOW (ref 1.7–2.4)

## 2023-03-17 MED ORDER — POTASSIUM CHLORIDE CRYS ER 20 MEQ PO TBCR
40.0000 meq | EXTENDED_RELEASE_TABLET | Freq: Once | ORAL | Status: DC
  Filled 2023-03-17: qty 2

## 2023-03-17 MED ORDER — DEXTROSE 5 % IV SOLN: Freq: Once | INTRAVENOUS | Status: AC

## 2023-03-17 MED ORDER — SODIUM CHLORIDE 0.9 % IV SOLN
150.0000 mg | Freq: Once | INTRAVENOUS | Status: AC
  Administered 2023-03-17: 150 mg via INTRAVENOUS
  Filled 2023-03-17: qty 150

## 2023-03-17 MED ORDER — MAGNESIUM SULFATE 2 GM/50ML IV SOLN
2.0000 g | INTRAVENOUS | Status: AC
  Administered 2023-03-17 (×2): 2 g via INTRAVENOUS
  Filled 2023-03-17 (×2): qty 50

## 2023-03-17 MED ORDER — POTASSIUM CHLORIDE 20 MEQ PO PACK
40.0000 meq | PACK | Freq: Once | ORAL | Status: AC
  Administered 2023-03-17: 40 meq via ORAL
  Filled 2023-03-17: qty 2

## 2023-03-17 MED ORDER — OXALIPLATIN CHEMO INJECTION 100 MG/20ML
55.0000 mg/m2 | Freq: Once | INTRAVENOUS | Status: AC
  Administered 2023-03-17: 130 mg via INTRAVENOUS
  Filled 2023-03-17: qty 20

## 2023-03-17 MED ORDER — ATROPINE SULFATE 1 MG/ML IV SOLN
0.5000 mg | Freq: Once | INTRAVENOUS | Status: AC
  Administered 2023-03-17: 0.5 mg via INTRAVENOUS
  Filled 2023-03-17: qty 1

## 2023-03-17 MED ORDER — SODIUM CHLORIDE 0.9 % IV SOLN
1920.0000 mg/m2 | INTRAVENOUS | Status: DC
  Administered 2023-03-17: 5000 mg via INTRAVENOUS
  Filled 2023-03-17: qty 100

## 2023-03-17 MED ORDER — SODIUM CHLORIDE 0.9 % IV SOLN
150.0000 mg/m2 | Freq: Once | INTRAVENOUS | Status: AC
  Administered 2023-03-17: 360 mg via INTRAVENOUS
  Filled 2023-03-17: qty 18

## 2023-03-17 MED ORDER — MAGNESIUM SULFATE 4 GM/100ML IV SOLN: 4.0000 g | Freq: Once | INTRAVENOUS | Status: DC

## 2023-03-17 MED ORDER — SODIUM CHLORIDE 0.9 % IV SOLN
400.0000 mg/m2 | Freq: Once | INTRAVENOUS | Status: AC
  Administered 2023-03-17: 948 mg via INTRAVENOUS
  Filled 2023-03-17: qty 47.4

## 2023-03-17 MED ORDER — SODIUM CHLORIDE 0.9% FLUSH
10.0000 mL | Freq: Once | INTRAVENOUS | Status: AC
  Administered 2023-03-17: 10 mL via INTRAVENOUS

## 2023-03-17 MED ORDER — PALONOSETRON HCL INJECTION 0.25 MG/5ML
0.2500 mg | Freq: Once | INTRAVENOUS | Status: AC
  Administered 2023-03-17: 0.25 mg via INTRAVENOUS
  Filled 2023-03-17: qty 5

## 2023-03-17 MED ORDER — SODIUM CHLORIDE 0.9 % IV SOLN
10.0000 mg | Freq: Once | INTRAVENOUS | Status: AC
  Administered 2023-03-17: 10 mg via INTRAVENOUS
  Filled 2023-03-17: qty 10

## 2023-03-17 NOTE — Progress Notes (Signed)
Patient presents today for chemotherapy infusion. Patient is in satisfactory condition with no new complaints voiced.  Vital signs are stable.  Labs reviewed by Dr. Ellin Saba during the office visit and all labs are within treatment parameters.  Magnesium today is 1.2.  We will give Magnesium sulfate 4 grams IV x one dose today per standing orders by Dr. Ellin Saba.  Potassium today is 3.2.  We will also give Klor Con 40 mEq PO x one dose today per standing orders by Dr. Ellin Saba.  We will proceed with treatment per MD orders.   Patient tolerated treatment well with no complaints voiced.  Home infusion 5FU pump connected with no issues.  Patient left via wheelchair with granddaughter in stable condition.  Vital signs stable at discharge.  Follow up as scheduled.

## 2023-03-17 NOTE — Patient Instructions (Signed)
[facility]  Discharge Instructions: Thank you for choosing [facility] to provide your oncology and hematology care.  If you have a lab appointment with the Cancer Center - please note that after April 8th, 2024, all labs will be drawn in the cancer center.  You do not have to check in or register with the main entrance as you have in the past but will complete your check-in in the cancer center.  Wear comfortable clothing and clothing appropriate for easy access to any Portacath or PICC line.   We strive to give you quality time with your provider. You may need to reschedule your appointment if you arrive late (15 or more minutes).  Arriving late affects you and other patients whose appointments are after yours.  Also, if you miss three or more appointments without notifying the office, you may be dismissed from the clinic at the provider's discretion.      For prescription refill requests, have your pharmacy contact our office and allow 72 hours for refills to be completed.    Today you received the following chemotherapy and/or immunotherapy agents Oxaliplatin/Leucovorin/Irinotecan/5FU.  Oxaliplatin Injection What is this medication? OXALIPLATIN (ox AL i PLA tin) treats colorectal cancer. It works by slowing down the growth of cancer cells. This medicine may be used for other purposes; ask your health care provider or pharmacist if you have questions. COMMON BRAND NAME(S): Eloxatin What should I tell my care team before I take this medication? They need to know if you have any of these conditions: Heart disease History of irregular heartbeat or rhythm Liver disease Low blood cell levels (white cells, red cells, and platelets) Lung or breathing disease, such as asthma Take medications that treat or prevent blood clots Tingling of the fingers, toes, or other nerve disorder An unusual or allergic reaction to oxaliplatin, other medications, foods, dyes,  or preservatives If you or your partner are pregnant or trying to get pregnant Breast-feeding How should I use this medication? This medication is injected into a vein. It is given by your care team in a hospital or clinic setting. Talk to your care team about the use of this medication in children. Special care may be needed. Overdosage: If you think you have taken too much of this medicine contact a poison control center or emergency room at once. NOTE: This medicine is only for you. Do not share this medicine with others. What if I miss a dose? Keep appointments for follow-up doses. It is important not to miss a dose. Call your care team if you are unable to keep an appointment. What may interact with this medication? Do not take this medication with any of the following: Cisapride Dronedarone Pimozide Thioridazine This medication may also interact with the following: Aspirin and aspirin-like medications Certain medications that treat or prevent blood clots, such as warfarin, apixaban, dabigatran, and rivaroxaban Cisplatin Cyclosporine Diuretics Medications for infection, such as acyclovir, adefovir, amphotericin B, bacitracin, cidofovir, foscarnet, ganciclovir, gentamicin, pentamidine, vancomycin NSAIDs, medications for pain and inflammation, such as ibuprofen or naproxen Other medications that cause heart rhythm changes Pamidronate Zoledronic acid This list may not describe all possible interactions. Give your health care provider a list of all the medicines, herbs, non-prescription drugs, or dietary supplements you use. Also tell them if you smoke, drink alcohol, or use illegal drugs. Some items may interact with your medicine. What should I watch for while using this medication? Your condition will be monitored carefully while  you are receiving this medication. You may need blood work while taking this medication. This medication may make you feel generally unwell. This is not  uncommon as chemotherapy can affect healthy cells as well as cancer cells. Report any side effects. Continue your course of treatment even though you feel ill unless your care team tells you to stop. This medication may increase your risk of getting an infection. Call your care team for advice if you get a fever, chills, sore throat, or other symptoms of a cold or flu. Do not treat yourself. Try to avoid being around people who are sick. Avoid taking medications that contain aspirin, acetaminophen, ibuprofen, naproxen, or ketoprofen unless instructed by your care team. These medications may hide a fever. Be careful brushing or flossing your teeth or using a toothpick because you may get an infection or bleed more easily. If you have any dental work done, tell your dentist you are receiving this medication. This medication can make you more sensitive to cold. Do not drink cold drinks or use ice. Cover exposed skin before coming in contact with cold temperatures or cold objects. When out in cold weather wear warm clothing and cover your mouth and nose to warm the air that goes into your lungs. Tell your care team if you get sensitive to the cold. Talk to your care team if you or your partner are pregnant or think either of you might be pregnant. This medication can cause serious birth defects if taken during pregnancy and for 9 months after the last dose. A negative pregnancy test is required before starting this medication. A reliable form of contraception is recommended while taking this medication and for 9 months after the last dose. Talk to your care team about effective forms of contraception. Do not father a child while taking this medication and for 6 months after the last dose. Use a condom while having sex during this time period. Do not breastfeed while taking this medication and for 3 months after the last dose. This medication may cause infertility. Talk to your care team if you are concerned about  your fertility. What side effects may I notice from receiving this medication? Side effects that you should report to your care team as soon as possible: Allergic reactions--skin rash, itching, hives, swelling of the face, lips, tongue, or throat Bleeding--bloody or black, tar-like stools, vomiting blood or Kuulei Kleier material that looks like coffee grounds, red or dark Lihanna Biever urine, small red or purple spots on skin, unusual bruising or bleeding Dry cough, shortness of breath or trouble breathing Heart rhythm changes--fast or irregular heartbeat, dizziness, feeling faint or lightheaded, chest pain, trouble breathing Infection--fever, chills, cough, sore throat, wounds that don't heal, pain or trouble when passing urine, general feeling of discomfort or being unwell Liver injury--right upper belly pain, loss of appetite, nausea, light-colored stool, dark yellow or Hunter Bachar urine, yellowing skin or eyes, unusual weakness or fatigue Low red blood cell level--unusual weakness or fatigue, dizziness, headache, trouble breathing Muscle injury--unusual weakness or fatigue, muscle pain, dark yellow or Keatyn Luck urine, decrease in amount of urine Pain, tingling, or numbness in the hands or feet Sudden and severe headache, confusion, change in vision, seizures, which may be signs of posterior reversible encephalopathy syndrome (PRES) Unusual bruising or bleeding Side effects that usually do not require medical attention (report to your care team if they continue or are bothersome): Diarrhea Nausea Pain, redness, or swelling with sores inside the mouth or throat Unusual weakness or  fatigue Vomiting This list may not describe all possible side effects. Call your doctor for medical advice about side effects. You may report side effects to FDA at 1-800-FDA-1088. Where should I keep my medication? This medication is given in a hospital or clinic. It will not be stored at home. NOTE: This sheet is a summary. It may not  cover all possible information. If you have questions about this medicine, talk to your doctor, pharmacist, or health care provider.  2023 Elsevier/Gold Standard (2007-12-31 00:00:00)    Leucovorin Injection What is this medication? LEUCOVORIN (loo koe VOR in) prevents side effects from certain medications, such as methotrexate. It works by increasing folate levels. This helps protect healthy cells in your body. It may also be used to treat anemia caused by low levels of folate. It can also be used with fluorouracil, a type of chemotherapy, to treat colorectal cancer. It works by increasing the effects of fluorouracil in the body. This medicine may be used for other purposes; ask your health care provider or pharmacist if you have questions. What should I tell my care team before I take this medication? They need to know if you have any of these conditions: Anemia from low levels of vitamin B12 in the blood An unusual or allergic reaction to leucovorin, folic acid, other medications, foods, dyes, or preservatives Pregnant or trying to get pregnant Breastfeeding How should I use this medication? This medication is injected into a vein or a muscle. It is given by your care team in a hospital or clinic setting. Talk to your care team about the use of this medication in children. Special care may be needed. Overdosage: If you think you have taken too much of this medicine contact a poison control center or emergency room at once. NOTE: This medicine is only for you. Do not share this medicine with others. What if I miss a dose? Keep appointments for follow-up doses. It is important not to miss your dose. Call your care team if you are unable to keep an appointment. What may interact with this medication? Capecitabine Fluorouracil Phenobarbital Phenytoin Primidone Trimethoprim;sulfamethoxazole This list may not describe all possible interactions. Give your health care provider a list of all  the medicines, herbs, non-prescription drugs, or dietary supplements you use. Also tell them if you smoke, drink alcohol, or use illegal drugs. Some items may interact with your medicine. What should I watch for while using this medication? Your condition will be monitored carefully while you are receiving this medication. This medication may increase the side effects of 5-fluorouracil. Tell your care team if you have diarrhea or mouth sores that do not get better or that get worse. What side effects may I notice from receiving this medication? Side effects that you should report to your care team as soon as possible: Allergic reactions--skin rash, itching, hives, swelling of the face, lips, tongue, or throat This list may not describe all possible side effects. Call your doctor for medical advice about side effects. You may report side effects to FDA at 1-800-FDA-1088. Where should I keep my medication? This medication is given in a hospital or clinic. It will not be stored at home. NOTE: This sheet is a summary. It may not cover all possible information. If you have questions about this medicine, talk to your doctor, pharmacist, or health care provider.  2023 Elsevier/Gold Standard (2022-03-20 00:00:00)    Irinotecan Injection What is this medication? IRINOTECAN (ir in oh TEE kan) treats some types  of cancer. It works by slowing down the growth of cancer cells. This medicine may be used for other purposes; ask your health care provider or pharmacist if you have questions. COMMON BRAND NAME(S): Camptosar What should I tell my care team before I take this medication? They need to know if you have any of these conditions: Dehydration Diarrhea Infection, especially a viral infection, such as chickenpox, cold sores, herpes Liver disease Low blood cell levels (white cells, red cells, and platelets) Low levels of electrolytes, such as calcium, magnesium, or potassium in your blood Recent or  ongoing radiation An unusual or allergic reaction to irinotecan, other medications, foods, dyes, or preservatives If you or your partner are pregnant or trying to get pregnant Breast-feeding How should I use this medication? This medication is injected into a vein. It is given by your care team in a hospital or clinic setting. Talk to your care team about the use of this medication in children. Special care may be needed. Overdosage: If you think you have taken too much of this medicine contact a poison control center or emergency room at once. NOTE: This medicine is only for you. Do not share this medicine with others. What if I miss a dose? Keep appointments for follow-up doses. It is important not to miss your dose. Call your care team if you are unable to keep an appointment. What may interact with this medication? Do not take this medication with any of the following: Cobicistat Itraconazole This medication may also interact with the following: Certain antibiotics, such as clarithromycin, rifampin, rifabutin Certain antivirals for HIV or AIDS Certain medications for fungal infections, such as ketoconazole, posaconazole, voriconazole Certain medications for seizures, such as carbamazepine, phenobarbital, phenytoin Gemfibrozil Nefazodone St. John's wort This list may not describe all possible interactions. Give your health care provider a list of all the medicines, herbs, non-prescription drugs, or dietary supplements you use. Also tell them if you smoke, drink alcohol, or use illegal drugs. Some items may interact with your medicine. What should I watch for while using this medication? Your condition will be monitored carefully while you are receiving this medication. You may need blood work while taking this medication. This medication may make you feel generally unwell. This is not uncommon as chemotherapy can affect healthy cells as well as cancer cells. Report any side effects.  Continue your course of treatment even though you feel ill unless your care team tells you to stop. This medication can cause serious side effects. To reduce the risk, your care team may give you other medications to take before receiving this one. Be sure to follow the directions from your care team. This medication may affect your coordination, reaction time, or judgement. Do not drive or operate machinery until you know how this medication affects you. Sit up or stand slowly to reduce the risk of dizzy or fainting spells. Drinking alcohol with this medication can increase the risk of these side effects. This medication may increase your risk of getting an infection. Call your care team for advice if you get a fever, chills, sore throat, or other symptoms of a cold or flu. Do not treat yourself. Try to avoid being around people who are sick. Avoid taking medications that contain aspirin, acetaminophen, ibuprofen, naproxen, or ketoprofen unless instructed by your care team. These medications may hide a fever. This medication may increase your risk to bruise or bleed. Call your care team if you notice any unusual bleeding. Be careful brushing  or flossing your teeth or using a toothpick because you may get an infection or bleed more easily. If you have any dental work done, tell your dentist you are receiving this medication. Talk to your care team if you or your partner are pregnant or think either of you might be pregnant. This medication can cause serious birth defects if taken during pregnancy and for 6 months after the last dose. You will need a negative pregnancy test before starting this medication. Contraception is recommended while taking this medication and for 6 months after the last dose. Your care team can help you find the option that works for you. Do not father a child while taking this medication and for 3 months after the last dose. Use a condom for contraception during this time period. Do  not breastfeed while taking this medication and for 7 days after the last dose. This medication may cause infertility. Talk to your care team if you are concerned about your fertility. What side effects may I notice from receiving this medication? Side effects that you should report to your care team as soon as possible: Allergic reactions--skin rash, itching, hives, swelling of the face, lips, tongue, or throat Dry cough, shortness of breath or trouble breathing Increased saliva or tears, increased sweating, stomach cramping, diarrhea, small pupils, unusual weakness or fatigue, slow heartbeat Infection--fever, chills, cough, sore throat, wounds that don't heal, pain or trouble when passing urine, general feeling of discomfort or being unwell Kidney injury--decrease in the amount of urine, swelling of the ankles, hands, or feet Low red blood cell level--unusual weakness or fatigue, dizziness, headache, trouble breathing Severe or prolonged diarrhea Unusual bruising or bleeding Side effects that usually do not require medical attention (report to your care team if they continue or are bothersome): Constipation Diarrhea Hair loss Loss of appetite Nausea Stomach pain This list may not describe all possible side effects. Call your doctor for medical advice about side effects. You may report side effects to FDA at 1-800-FDA-1088. Where should I keep my medication? This medication is given in a hospital or clinic. It will not be stored at home. NOTE: This sheet is a summary. It may not cover all possible information. If you have questions about this medicine, talk to your doctor, pharmacist, or health care provider.  2023 Elsevier/Gold Standard (2022-03-19 00:00:00)    Fluorouracil Injection What is this medication? FLUOROURACIL (flure oh YOOR a sil) treats some types of cancer. It works by slowing down the growth of cancer cells. This medicine may be used for other purposes; ask your health  care provider or pharmacist if you have questions. COMMON BRAND NAME(S): Adrucil What should I tell my care team before I take this medication? They need to know if you have any of these conditions: Blood disorders Dihydropyrimidine dehydrogenase (DPD) deficiency Infection, such as chickenpox, cold sores, herpes Kidney disease Liver disease Poor nutrition Recent or ongoing radiation therapy An unusual or allergic reaction to fluorouracil, other medications, foods, dyes, or preservatives If you or your partner are pregnant or trying to get pregnant Breast-feeding How should I use this medication? This medication is injected into a vein. It is administered by your care team in a hospital or clinic setting. Talk to your care team about the use of this medication in children. Special care may be needed. Overdosage: If you think you have taken too much of this medicine contact a poison control center or emergency room at once. NOTE: This medicine is only  for you. Do not share this medicine with others. What if I miss a dose? Keep appointments for follow-up doses. It is important not to miss your dose. Call your care team if you are unable to keep an appointment. What may interact with this medication? Do not take this medication with any of the following: Live virus vaccines This medication may also interact with the following: Medications that treat or prevent blood clots, such as warfarin, enoxaparin, dalteparin This list may not describe all possible interactions. Give your health care provider a list of all the medicines, herbs, non-prescription drugs, or dietary supplements you use. Also tell them if you smoke, drink alcohol, or use illegal drugs. Some items may interact with your medicine. What should I watch for while using this medication? Your condition will be monitored carefully while you are receiving this medication. This medication may make you feel generally unwell. This is not  uncommon as chemotherapy can affect healthy cells as well as cancer cells. Report any side effects. Continue your course of treatment even though you feel ill unless your care team tells you to stop. In some cases, you may be given additional medications to help with side effects. Follow all directions for their use. This medication may increase your risk of getting an infection. Call your care team for advice if you get a fever, chills, sore throat, or other symptoms of a cold or flu. Do not treat yourself. Try to avoid being around people who are sick. This medication may increase your risk to bruise or bleed. Call your care team if you notice any unusual bleeding. Be careful brushing or flossing your teeth or using a toothpick because you may get an infection or bleed more easily. If you have any dental work done, tell your dentist you are receiving this medication. Avoid taking medications that contain aspirin, acetaminophen, ibuprofen, naproxen, or ketoprofen unless instructed by your care team. These medications may hide a fever. Do not treat diarrhea with over the counter products. Contact your care team if you have diarrhea that lasts more than 2 days or if it is severe and watery. This medication can make you more sensitive to the sun. Keep out of the sun. If you cannot avoid being in the sun, wear protective clothing and sunscreen. Do not use sun lamps, tanning beds, or tanning booths. Talk to your care team if you or your partner wish to become pregnant or think you might be pregnant. This medication can cause serious birth defects if taken during pregnancy and for 3 months after the last dose. A reliable form of contraception is recommended while taking this medication and for 3 months after the last dose. Talk to your care team about effective forms of contraception. Do not father a child while taking this medication and for 3 months after the last dose. Use a condom while having sex during this  time period. Do not breastfeed while taking this medication. This medication may cause infertility. Talk to your care team if you are concerned about your fertility. What side effects may I notice from receiving this medication? Side effects that you should report to your care team as soon as possible: Allergic reactions--skin rash, itching, hives, swelling of the face, lips, tongue, or throat Heart attack--pain or tightness in the chest, shoulders, arms, or jaw, nausea, shortness of breath, cold or clammy skin, feeling faint or lightheaded Heart failure--shortness of breath, swelling of the ankles, feet, or hands, sudden weight gain, unusual weakness  or fatigue Heart rhythm changes--fast or irregular heartbeat, dizziness, feeling faint or lightheaded, chest pain, trouble breathing High ammonia level--unusual weakness or fatigue, confusion, loss of appetite, nausea, vomiting, seizures Infection--fever, chills, cough, sore throat, wounds that don't heal, pain or trouble when passing urine, general feeling of discomfort or being unwell Low red blood cell level--unusual weakness or fatigue, dizziness, headache, trouble breathing Pain, tingling, or numbness in the hands or feet, muscle weakness, change in vision, confusion or trouble speaking, loss of balance or coordination, trouble walking, seizures Redness, swelling, and blistering of the skin over hands and feet Severe or prolonged diarrhea Unusual bruising or bleeding Side effects that usually do not require medical attention (report to your care team if they continue or are bothersome): Dry skin Headache Increased tears Nausea Pain, redness, or swelling with sores inside the mouth or throat Sensitivity to light Vomiting This list may not describe all possible side effects. Call your doctor for medical advice about side effects. You may report side effects to FDA at 1-800-FDA-1088. Where should I keep my medication? This medication is  given in a hospital or clinic. It will not be stored at home. NOTE: This sheet is a summary. It may not cover all possible information. If you have questions about this medicine, talk to your doctor, pharmacist, or health care provider.  2023 Elsevier/Gold Standard (2022-03-10 00:00:00)        To help prevent nausea and vomiting after your treatment, we encourage you to take your nausea medication as directed.  BELOW ARE SYMPTOMS THAT SHOULD BE REPORTED IMMEDIATELY: *FEVER GREATER THAN 100.4 F (38 C) OR HIGHER *CHILLS OR SWEATING *NAUSEA AND VOMITING THAT IS NOT CONTROLLED WITH YOUR NAUSEA MEDICATION *UNUSUAL SHORTNESS OF BREATH *UNUSUAL BRUISING OR BLEEDING *URINARY PROBLEMS (pain or burning when urinating, or frequent urination) *BOWEL PROBLEMS (unusual diarrhea, constipation, pain near the anus) TENDERNESS IN MOUTH AND THROAT WITH OR WITHOUT PRESENCE OF ULCERS (sore throat, sores in mouth, or a toothache) UNUSUAL RASH, SWELLING OR PAIN  UNUSUAL VAGINAL DISCHARGE OR ITCHING   Items with * indicate a potential emergency and should be followed up as soon as possible or go to the Emergency Department if any problems should occur.  Please show the CHEMOTHERAPY ALERT CARD or IMMUNOTHERAPY ALERT CARD at check-in to the Emergency Department and triage nurse.  Should you have questions after your visit or need to cancel or reschedule your appointment, please contact [facility] AT [facility] [phone]  and follow the prompts.  Office hours are 8:00 a.m. to 4:30 p.m. Monday - Friday. Please note that voicemails left after 4:00 p.m. may not be returned until the following business day.  We are closed weekends and major holidays. You have access to a nurse at all times for urgent questions. Please call the main number to the clinic [phone] and follow the prompts.  For any non-urgent questions, you may also contact your provider using MyChart. We now offer e-Visits for anyone 23 and  older to request care online for non-urgent symptoms. For details visit [email or web address].   Also download the MyChart app! Go to the app store, search "MyChart", open the app, select , and log in with your MyChart username and password.

## 2023-03-17 NOTE — Patient Instructions (Signed)
[facility] at [facility] Discharge Instructions   You were seen and examined today by Dr. Ellin Saba.  He reviewed the results of your lab work which are mostly normal/stable. Your magnesium is severely low at 1.2. Your potassium is slightly low at 3.2. We will give you IV magnesium in the clinic and some potassium pills.   We will proceed with your treatment today.  Return as scheduled.    Thank you for choosing [facility] at [facility] to provide your oncology and hematology care.  To afford each patient quality time with our provider, please arrive at least 15 minutes before your scheduled appointment time.   If you have a lab appointment with the Cancer Center please come in thru the Main Entrance and check in at the main information desk.  You need to re-schedule your appointment should you arrive 10 or more minutes late.  We strive to give you quality time with our providers, and arriving late affects you and other patients whose appointments are after yours.  Also, if you no show three or more times for appointments you may be dismissed from the clinic at the providers discretion.     Again, thank you for choosing [facility].  Our hope is that these requests will decrease the amount of time that you wait before being seen by our physicians.       _____________________________________________________________  Should you have questions after your visit to [facility], please contact our office at [phone] and follow the prompts.  Our office hours are 8:00 a.m. and 4:30 p.m. Monday - Friday.  Please note that voicemails left after 4:00 p.m. may not be returned until the following business day.  We are closed weekends and major holidays.  You do have access to a nurse 24-7, just call the main number to the clinic [phone] and do not press any options, hold on the line and a nurse will answer the phone.     For prescription refill requests, have your pharmacy contact our office and allow 72 hours.    Due to Covid, you will need to wear a mask upon entering the hospital. If you do not have a mask, a mask will be given to you at the Main Entrance upon arrival. For doctor visits, patients may have 1 support person age 40 or older with them. For treatment visits, patients can not have anyone with them due to social distancing guidelines and our immunocompromised population.

## 2023-03-17 NOTE — Progress Notes (Signed)
Maintain 5-FU pump at 5000 mg - dose right at 10% due to dose rounding in EPIC.    Pryor Ochoa, PharmD

## 2023-03-19 ENCOUNTER — Other Ambulatory Visit: Payer: Self-pay | Admitting: Nurse Practitioner

## 2023-03-19 ENCOUNTER — Other Ambulatory Visit: Payer: Self-pay

## 2023-03-19 ENCOUNTER — Inpatient Hospital Stay: Payer: Medicare HMO

## 2023-03-19 VITALS — BP 117/71 | HR 80 | Temp 98.0°F | Resp 18

## 2023-03-19 DIAGNOSIS — Z5111 Encounter for antineoplastic chemotherapy: Secondary | ICD-10-CM | POA: Diagnosis not present

## 2023-03-19 DIAGNOSIS — C20 Malignant neoplasm of rectum: Secondary | ICD-10-CM

## 2023-03-19 MED ORDER — PEGFILGRASTIM-CBQV 6 MG/0.6ML ~~LOC~~ SOSY
6.0000 mg | PREFILLED_SYRINGE | Freq: Once | SUBCUTANEOUS | Status: AC
  Administered 2023-03-19: 6 mg via SUBCUTANEOUS
  Filled 2023-03-19: qty 0.6

## 2023-03-19 MED ORDER — HEPARIN SOD (PORK) LOCK FLUSH 100 UNIT/ML IV SOLN
500.0000 [IU] | Freq: Once | INTRAVENOUS | Status: AC | PRN
  Administered 2023-03-19: 500 [IU]

## 2023-03-19 MED ORDER — SODIUM CHLORIDE 0.9% FLUSH
10.0000 mL | INTRAVENOUS | Status: DC | PRN
  Administered 2023-03-19: 10 mL

## 2023-03-19 MED ORDER — TRUE METRIX METER W/DEVICE KIT: 1.0000 | PACK | Freq: Two times a day (BID) | 0 refills | Status: AC

## 2023-03-19 NOTE — Patient Instructions (Signed)
[facility] [place]  Discharge Instructions: Thank you for choosing [facility] to provide your oncology and hematology care.  If you have a lab appointment with the Cancer Center - please note that after April 8th, 2024, all labs will be drawn in the cancer center.  You do not have to check in or register with the main entrance as you have in the past but will complete your check-in in the cancer center.  Wear comfortable clothing and clothing appropriate for easy access to any Portacath or PICC line.   We strive to give you quality time with your provider. You may need to reschedule your appointment if you arrive late (15 or more minutes).  Arriving late affects you and other patients whose appointments are after yours.  Also, if you miss three or more appointments without notifying the office, you may be dismissed from the clinic at the provider's discretion.      For prescription refill requests, have your pharmacy contact our office and allow 72 hours for refills to be completed.    Today you received the following chemotherapy and/or immunotherapy agents pump d/c.       To help prevent nausea and vomiting after your treatment, we encourage you to take your nausea medication as directed.  BELOW ARE SYMPTOMS THAT SHOULD BE REPORTED IMMEDIATELY: *FEVER GREATER THAN 100.4 F (38 C) OR HIGHER *CHILLS OR SWEATING *NAUSEA AND VOMITING THAT IS NOT CONTROLLED WITH YOUR NAUSEA MEDICATION *UNUSUAL SHORTNESS OF BREATH *UNUSUAL BRUISING OR BLEEDING *URINARY PROBLEMS (pain or burning when urinating, or frequent urination) *BOWEL PROBLEMS (unusual diarrhea, constipation, pain near the anus) TENDERNESS IN MOUTH AND THROAT WITH OR WITHOUT PRESENCE OF ULCERS (sore throat, sores in mouth, or a toothache) UNUSUAL RASH, SWELLING OR PAIN  UNUSUAL VAGINAL DISCHARGE OR ITCHING   Items with * indicate a potential emergency and should be followed up as soon as possible or go to the  Emergency Department if any problems should occur.  Please show the CHEMOTHERAPY ALERT CARD or IMMUNOTHERAPY ALERT CARD at check-in to the Emergency Department and triage nurse.  Should you have questions after your visit or need to cancel or reschedule your appointment, please contact [facility] [place] [phone]  and follow the prompts.  Office hours are 8:00 a.m. to 4:30 p.m. Monday - Friday. Please note that voicemails left after 4:00 p.m. may not be returned until the following business day.  We are closed weekends and major holidays. You have access to a nurse at all times for urgent questions. Please call the main number to the clinic [phone] and follow the prompts.  For any non-urgent questions, you may also contact your provider using MyChart. We now offer e-Visits for anyone 18 and older to request care online for non-urgent symptoms. For details visit [email or web address].   Also download the MyChart app! Go to the app store, search "MyChart", open the app, select [place], and log in with your MyChart username and password.   

## 2023-03-19 NOTE — Progress Notes (Unsigned)
[facility] MEDICAL [facility]COL[facility] [address] [place] [address] Phone: [phone] Fax: [phone]  SYMPTOM MANAGEMENT CLINIC PROGRESS NOTE   [name] [id number] 07-24-1956 67 y.o.  [name] is managed by Dr. Ellin Saba for her stage IIc rectal cancer   Actively treated with chemotherapy/immunotherapy/hormonal therapy: YES   Current therapy: FOLFIRINOX   Last treated: Cycle #5 started 03/17/2023 with pump D/C and Udenyca on 03/19/2023  INTERVAL HISTORY:  Chief Complaint: Chemotherapy follow-up & symptom management visit  [name] reports feeling poorly after her fifth cycle of chemotherapy.  She reports that she was "doing okay" last week and was eating about 50% of normal oral intake until Saturday.  Since Saturday, she has had severely decreased appetite and very poor oral intake.  She has "tried to eat yogurt and cottage cheese but just cannot get herself to swallow it," although she denies any physical dysphagia or odynophagia.  She reports that everything tastes bad.  She has been drinking about 32 ounces of water daily and 1 Ensure daily.  She threw up her Ensure yesterday.  Her weight today is 249 pounds, which is down 4 pounds in the past week.  She reports severe fatigue.  She has ongoing nausea that is somewhat improved after combination of olanzapine, Compazine, and Zofran.  She had 1 episode of vomiting this week.  She has had 6-8 episodes of watery diarrhea daily since Saturday.  She has been taking Imodium as recommended.  Her Mounjaro, NovoLog, and metformin have been stopped by endocrinology.  She has a stable diabetic peripheral neuropathy.  Cold sensitivity from oxaliplatin has improved.  She denies any significant pelvic or rectal pain this week.  She has prescription for hydrocodone available.   She denies any major rectal bleeding this week.  She denies any ice pica.  Previously reported mouth sore resolved after Carafate/lidocaine  mouthwash.  No recurrent mouth sores at this time.   PERTINENT NEGATIVES: No changes in urine, rash/skin changes, peripheral edema, chest pain, shortness of breath, dyspnea.   ASSESSMENT & PLAN:  ## STAGE IIC RECTAL CANCER - Primary oncologist is Dr. Ellin Saba - Patient is on treatment plan FOLFIRINOX q. 14 days x 8 cycles - C5/D1 of FOLFIRINOX on 03/17/2023 with pump D/C on 03/19/2023 - She received Udenyca on 03/19/2023 - PLAN: Next scheduled visit with medical oncologist (Dr. Ellin Saba) on 03/31/2023 for cycle #5 of chemotherapy  # Diarrhea - Ongoing diarrhea since cycle #2 - Treated for possible diverticulitis after CT A/P from ED (02/21/2023) showed "sigmoid diverticulosis with mild wall thickening of the sigmoid colon, suggesting mild diverticulitis".  C. difficile and GI pathogen panel were negative. - Currently reports 6-8 soft bowel movements daily ranging from watery to soft. - She is taking Imodium as recommended - She received atropine premedication with cycle #5 of chemotherapy - PLAN: Continue Imodium as recommended (4 mg after first watery bowel movements, with 2 mg after each subsequent watery bowel movement, up to 60 mg daily) - We will add Lomotil every 6 hours  # Nausea and vomiting - Refractory chemo-induced nausea/vomiting despite Compazine and Zofran - Improved after starting olanzapine 5 mg nightly  - PLAN: Continue olanzapine 5 mg nightly - Continue Compazine every 6 hours with Zofran 8 mg ODT TID PRN breakthrough nausea/vomiting.  # Dehydration # Weight loss and decreased appetite  - Severely decreased appetite and very poor oral intake. - She is taking Megace 400 mg daily. - Mounjaro, metformin, and NovoLog  were discontinued by endocrinology. - Weight today is 249 pounds, which is down 4 pounds in the past week.  - PLAN: 1 L NS given in clinic today - Continue close follow-up with nutritionist - We will try Marinol 5 mg twice daily instead of Megace to see if  she has some improvement on this. - Scheduled for IV fluids again on Thursday this week  # Hypokalemia # Hypomagnesemia - She is taking magnesium 400 mg twice daily and potassium 40 mEq twice daily - Continues to have hypokalemia and hypomagnesemia despite oral repletion, likely secondary to GI losses - PLAN: Continue magnesium 400 mg twice daily + potassium 40 mill equivalents twice daily - IV repletion today with 4 g IV magnesium + 40 mEq IV potassium - Recheck labs and give possible IV electrolytes on Thursday this week  # Depression - PLAN: Continue fluoxetine 20 mg  # Chemotherapy-induced pancytopenia - CBC today (03/22/2023): Hgb 8.2, platelets 124, WBC 16.7  - Patient does also have underlying liver cirrhosis, but has previously had normal WBCs and platelets. 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   - She has received IV Venofer 400 mg x 3 (most recently on 02/23/2023)  # Diabetic peripheral neuropathy - She has no feeling in her toes.  No worsening since oxaliplatin started, but reports onset of cold sensitivity after second cycle of chemotherapy.   - PLAN: Continue Lyrica twice daily.  # Pelvic pain -  Intermittent pelvic pain, none today. - She has prescription for hydrocodone 5 mg every 4-6 hours as needed for severe pain.  # Folic acid deficiency - PLAN: Continue folic acid 1 mg tablet daily  PLAN SUMMARY: >> Schedule for labs (BMP, magnesium, CBC) + possible IV fluids/electrolytes on Thursday, 03/25/2023 >> Next scheduled appointment with medical oncologist: 03/31/2023 (cycle #5 chemotherapy)    REVIEW OF SYSTEMS:  Review of Systems  Constitutional:  Positive for activity change, appetite change, fatigue and unexpected weight change. Negative for chills,  diaphoresis and fever.  HENT:  Positive for trouble swallowing (pills). Negative for mouth sores, nosebleeds and sore throat.   Respiratory:  Negative for cough and shortness of breath.   Cardiovascular:  Negative for chest pain, palpitations and leg swelling.  Gastrointestinal:  Positive for diarrhea, nausea and vomiting. Negative for abdominal pain, blood in stool and constipation.  Genitourinary:  Negative for dysuria and hematuria.  Neurological:  Negative for dizziness, light-headedness, numbness and headaches.  Psychiatric/Behavioral:  Positive for dysphoric mood and sleep disturbance. The patient is nervous/anxious.     Past Medical History, Surgical history, Social history, and Family history were reviewed as documented elsewhere in chart, and were updated as appropriate.   OBJECTIVE:  Physical Exam:  There were no vitals taken for this visit. ECOG: 3  Physical Exam Constitutional:      Appearance: Normal appearance. She is morbidly obese.     Comments: Somewhat weak appearing on exam  Cardiovascular:     Rate and Rhythm: Tachycardia present.     Heart sounds: Murmur heard.  Pulmonary:     Breath sounds: Normal breath sounds.  Musculoskeletal:     Right lower leg: Edema (1+) present.     Left lower leg: Edema (1+) present.  Skin:    General: Skin is warm and dry.  Coloration: Skin is pale.     Comments: Poor turgor  Neurological:     General: No focal deficit present.     Mental Status: Mental status is at baseline.  Psychiatric:        Behavior: Behavior normal. Behavior is cooperative.    Lab Review:     Component Value Date/Time   NA 137 03/17/2023 0824   NA 139 09/03/2022 1305   K 3.2 (L) 03/17/2023 0824   CL 106 03/17/2023 0824   CO2 22 03/17/2023 0824   GLUCOSE 167 (H) 03/17/2023 0824   BUN <5 (L) 03/17/2023 0824   BUN 8 09/03/2022 1305   CREATININE 0.84 03/17/2023 0824   CREATININE 0.84 01/26/2023 1503   CALCIUM 8.0 (L) 03/17/2023 0824   PROT  5.5 (L) 03/17/2023 0824   PROT 6.6 09/03/2022 1305   ALBUMIN 2.7 (L) 03/17/2023 0824   ALBUMIN 3.7 (L) 09/03/2022 1305   AST 27 03/17/2023 0824   ALT 14 03/17/2023 0824   ALKPHOS 88 03/17/2023 0824   BILITOT 0.7 03/17/2023 0824   BILITOT 0.3 09/03/2022 1305   GFRNONAA >60 03/17/2023 0824   GFRNONAA 78 12/14/2019 0848   GFRAA 81 12/11/2020 0908   GFRAA 90 12/14/2019 0848       Component Value Date/Time   WBC 4.5 03/17/2023 0824   RBC 3.13 (L) 03/17/2023 0824   HGB 8.9 (L) 03/17/2023 0824   HGB 8.6 (L) 11/26/2022 1343   HCT 27.8 (L) 03/17/2023 0824   HCT 30.5 (L) 11/26/2022 1343   PLT 120 (L) 03/17/2023 0824   PLT 199 11/26/2022 1343   MCV 88.8 03/17/2023 0824   MCV 76 (L) 11/26/2022 1343   MCH 28.4 03/17/2023 0824   MCHC 32.0 03/17/2023 0824   RDW 24.2 (H) 03/17/2023 0824   RDW 16.6 (H) 11/26/2022 1343   LYMPHSABS 1.1 03/17/2023 0824   MONOABS 0.3 03/17/2023 0824   EOSABS 0.0 03/17/2023 0824   BASOSABS 0.0 03/17/2023 0824   -------------------------------  Imaging from last 24 hours (if applicable): Radiology interpretation: CT ABDOMEN PELVIS WO CONTRAST  Result Date: 02/21/2023 CLINICAL DATA:  Emesis, bowel obstruction, rectal cancer, chemotherapy * Tracking Code: BO * EXAM: CT ABDOMEN AND PELVIS WITHOUT CONTRAST TECHNIQUE: Multidetector CT imaging of the abdomen and pelvis was performed following the standard protocol without IV contrast. RADIATION DOSE REDUCTION: This exam was performed according to the departmental dose-optimization program which includes automated exposure control, adjustment of the mA and/or kV according to patient size and/or use of iterative reconstruction technique. COMPARISON:  PET-CT, 12/31/2022 FINDINGS: Lower chest: No acute abnormality. Left coronary artery calcifications. Hepatobiliary: No solid liver abnormality is seen. Coarse contour of the faintly calcified gallstones. No gallbladder wall thickening, or biliary dilatation. Pancreas:  Unremarkable. No pancreatic ductal dilatation or surrounding inflammatory changes. Spleen: Normal in size without significant abnormality. Adrenals/Urinary Tract: Adrenal glands are unremarkable. Unchanged partially exophytic lesion of the posterior midportion of the right kidney measuring 2.9 x 2.6 cm (series 3, image 41). Bladder is unremarkable. Stomach/Bowel: Stomach is within normal limits. Appendix is not clearly visualized. Unchanged left eccentric rectal mass (series 3, image 95). Colon is fluid-filled to the rectum. Sigmoid diverticulosis. Mild wall thickening of the sigmoid colon (series 3, image 82). No evidence of bowel obstruction. Vascular/Lymphatic: Scattered aortic atherosclerosis. No enlarged abdominal or pelvic lymph nodes. Reproductive: Status post hysterectomy. Other: Small, fat containing right inguinal hernia.  No ascites. Musculoskeletal: No acute or significant osseous findings. IMPRESSION: 1. Colon is fluid-filled to the rectum,  consistent with diarrheal illness. Sigmoid diverticulosis with mild wall thickening of the sigmoid colon, suggesting mild diverticulitis. 2. No evidence of bowel obstruction. 3. Unchanged rectal mass. 4. 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 Nonemergent, outpatient MRI may be helpful to more definitively characterize this lesion if desired in the setting of known primary rectal malignancy. 5. Cholelithiasis. 6. Coronary artery disease. Aortic Atherosclerosis (ICD10-I70.0). Electronically Signed   By: Jearld Lesch M.D.   On: 02/21/2023 15:07   DG Chest Portable 1 View  Result Date: 02/21/2023 CLINICAL DATA:  Weakness. On active chemotherapy. EXAM: PORTABLE CHEST 1 VIEW COMPARISON:  Radiograph 06/27/2021. CT 12/17/2022 FINDINGS: Right chest port with tip overlying the jugular caval junction. There is chronic eventration of  the right hemidiaphragm which partially obscures the mediastinal contours, grossly stable from prior exam. Overall low lung volumes which limits assessment. No obvious focal airspace disease, pleural effusion or pneumothorax. IMPRESSION: Low lung volumes limit assessment. 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 Allowing for limitations, no acute findings Electronically Signed   By: Narda Rutherford M.D.   On: 02/21/2023 14:25      WRAP UP:  All questions were answered. The patient knows to call the clinic with any problems, questions or concerns.  Medical decision making: High  Time spent on visit: I spent 30 minutes counseling the patient face to face. The total time spent in the appointment was 45 minutes and more than 50% was on counseling.  Carnella Guadalajara, PA-C  03/22/23 11:36 AM

## 2023-03-19 NOTE — Progress Notes (Signed)
Patient presents today for pump d/c and Udenyca injection. Vital signs are stable. Port a cath site clean, dry, and intact. Port flushed with 10 mls of Normal Saline and 500 Units of Heparin. Needle removed intact. Band aid applied. Patient has no complaints at this time. Discharged from clinic by wheel chair and in stable condition. Patient alert and oriented.

## 2023-03-22 ENCOUNTER — Inpatient Hospital Stay: Payer: Medicare HMO

## 2023-03-22 ENCOUNTER — Inpatient Hospital Stay: Payer: Medicare HMO | Admitting: Physician Assistant

## 2023-03-22 ENCOUNTER — Inpatient Hospital Stay: Payer: Medicare HMO | Admitting: Dietician

## 2023-03-22 DIAGNOSIS — R112 Nausea with vomiting, unspecified: Secondary | ICD-10-CM

## 2023-03-22 DIAGNOSIS — C20 Malignant neoplasm of rectum: Secondary | ICD-10-CM

## 2023-03-22 DIAGNOSIS — R64 Cachexia: Secondary | ICD-10-CM

## 2023-03-22 DIAGNOSIS — K521 Toxic gastroenteritis and colitis: Secondary | ICD-10-CM

## 2023-03-22 DIAGNOSIS — E860 Dehydration: Secondary | ICD-10-CM

## 2023-03-22 DIAGNOSIS — Z95828 Presence of other vascular implants and grafts: Secondary | ICD-10-CM

## 2023-03-22 DIAGNOSIS — E876 Hypokalemia: Secondary | ICD-10-CM

## 2023-03-22 DIAGNOSIS — T451X5A Adverse effect of antineoplastic and immunosuppressive drugs, initial encounter: Secondary | ICD-10-CM

## 2023-03-22 DIAGNOSIS — R197 Diarrhea, unspecified: Secondary | ICD-10-CM

## 2023-03-22 DIAGNOSIS — R630 Anorexia: Secondary | ICD-10-CM

## 2023-03-22 DIAGNOSIS — Z5111 Encounter for antineoplastic chemotherapy: Secondary | ICD-10-CM | POA: Diagnosis not present

## 2023-03-22 DIAGNOSIS — D61810 Antineoplastic chemotherapy induced pancytopenia: Secondary | ICD-10-CM

## 2023-03-22 LAB — COMPREHENSIVE METABOLIC PANEL
ALT: 15 U/L (ref 0–44)
AST: 35 U/L (ref 15–41)
Albumin: 2.6 g/dL — ABNORMAL LOW (ref 3.5–5.0)
Alkaline Phosphatase: 115 U/L (ref 38–126)
Anion gap: 8 (ref 5–15)
BUN: 11 mg/dL (ref 8–23)
CO2: 24 mmol/L (ref 22–32)
Calcium: 8.3 mg/dL — ABNORMAL LOW (ref 8.9–10.3)
Chloride: 105 mmol/L (ref 98–111)
Creatinine, Ser: 0.76 mg/dL (ref 0.44–1.00)
GFR, Estimated: >60 mL/min (ref >60)
Glucose, Bld: 149 mg/dL — ABNORMAL HIGH (ref 70–99)
Potassium: 3.1 mmol/L — ABNORMAL LOW (ref 3.5–5.1)
Sodium: 137 mmol/L (ref 135–145)
Total Bilirubin: 0.8 mg/dL (ref 0.3–1.2)
Total Protein: 5.5 g/dL — ABNORMAL LOW (ref 6.5–8.1)

## 2023-03-22 LAB — CBC WITH DIFFERENTIAL/PLATELET
Abs Immature Granulocytes: 0 10*3/uL (ref 0.00–0.07)
Band Neutrophils: 1 %
Basophils Absolute: 0 10*3/uL (ref 0.0–0.1)
Basophils Relative: 0 %
Eosinophils Absolute: 0 10*3/uL (ref 0.0–0.5)
Eosinophils Relative: 0 %
HCT: 25.5 % — ABNORMAL LOW (ref 36.0–46.0)
Hemoglobin: 8.2 g/dL — ABNORMAL LOW (ref 12.0–15.0)
Lymphocytes Relative: 4 %
Lymphs Abs: 0.7 10*3/uL (ref 0.7–4.0)
MCH: 29.4 pg (ref 26.0–34.0)
MCHC: 32.2 g/dL (ref 30.0–36.0)
MCV: 91.4 fL (ref 80.0–100.0)
Monocytes Absolute: 0 10*3/uL — ABNORMAL LOW (ref 0.1–1.0)
Monocytes Relative: 0 %
Neutro Abs: 16 10*3/uL — ABNORMAL HIGH (ref 1.7–7.7)
Neutrophils Relative %: 95 %
Platelets: 124 10*3/uL — ABNORMAL LOW (ref 150–400)
RBC: 2.79 MIL/uL — ABNORMAL LOW (ref 3.87–5.11)
RDW: 24.8 % — ABNORMAL HIGH (ref 11.5–15.5)
WBC: 16.7 10*3/uL — ABNORMAL HIGH (ref 4.0–10.5)
nRBC: 0 % (ref 0.0–0.2)

## 2023-03-22 LAB — SAMPLE TO BLOOD BANK

## 2023-03-22 LAB — MAGNESIUM: Magnesium: 1.5 mg/dL — ABNORMAL LOW (ref 1.7–2.4)

## 2023-03-22 MED ORDER — SODIUM CHLORIDE 0.9% FLUSH
10.0000 mL | INTRAVENOUS | Status: DC | PRN
  Administered 2023-03-22: 10 mL via INTRAVENOUS

## 2023-03-22 MED ORDER — DIPHENOXYLATE-ATROPINE 2.5-0.025 MG PO TABS
1.0000 | ORAL_TABLET | Freq: Four times a day (QID) | ORAL | 3 refills | Status: DC
Start: 2023-03-22 — End: 2023-03-25

## 2023-03-22 MED ORDER — DRONABINOL 5 MG PO CAPS
5.0000 mg | ORAL_CAPSULE | Freq: Two times a day (BID) | ORAL | 3 refills | Status: DC
Start: 2023-03-22 — End: 2023-03-25

## 2023-03-22 MED ORDER — HEPARIN SOD (PORK) LOCK FLUSH 100 UNIT/ML IV SOLN
500.0000 [IU] | Freq: Once | INTRAVENOUS | Status: AC
  Administered 2023-03-22: 500 [IU] via INTRAVENOUS

## 2023-03-22 MED ORDER — DIPHENOXYLATE-ATROPINE 2.5-0.025 MG PO TABS
1.0000 | ORAL_TABLET | Freq: Once | ORAL | Status: AC
  Administered 2023-03-22: 1 via ORAL
  Filled 2023-03-22: qty 1

## 2023-03-22 MED ORDER — POTASSIUM CHLORIDE IN NACL 40-0.9 MEQ/L-% IV SOLN
Freq: Once | INTRAVENOUS | Status: AC
  Filled 2023-03-22: qty 1000

## 2023-03-22 MED ORDER — MAGNESIUM SULFATE 2 GM/50ML IV SOLN
2.0000 g | INTRAVENOUS | Status: AC
  Administered 2023-03-22 (×2): 2 g via INTRAVENOUS
  Filled 2023-03-22: qty 50

## 2023-03-22 NOTE — Progress Notes (Signed)
Patient presents today for possible IVF.   Patient is in satisfactory condition with no new complaints voiced.  Vital signs are stable.  Labs reviewed by Dr. Ellin Saba during the office visit.  Magnesium today is 1.5 and potassium is 3.1.  We will give IVF today with 40 mEq of potassium per Rojelio Brenner, PA-C.  We will also give magnesium 4 gram IV x one dose today per PA-C.    Patient tolerated IVF well with no complaints voiced.  Patient left via wheelchair with daughter in stable condition.  Vital signs stable at discharge.  Follow up as scheduled.

## 2023-03-22 NOTE — Patient Instructions (Signed)
[facility] [place]  Discharge Instructions: Thank you for choosing [facility] to provide your oncology and hematology care.  If you have a lab appointment with the Cancer Center - please note that after April 8th, 2024, all labs will be drawn in the cancer center.  You do not have to check in or register with the main entrance as you have in the past but will complete your check-in in the cancer center.  Wear comfortable clothing and clothing appropriate for easy access to any Portacath or PICC line.   We strive to give you quality time with your provider. You may need to reschedule your appointment if you arrive late (15 or more minutes).  Arriving late affects you and other patients whose appointments are after yours.  Also, if you miss three or more appointments without notifying the office, you may be dismissed from the clinic at the provider's discretion.      For prescription refill requests, have your pharmacy contact our office and allow 72 hours for refills to be completed.  To help prevent nausea and vomiting after your treatment, we encourage you to take your nausea medication as directed.  BELOW ARE SYMPTOMS THAT SHOULD BE REPORTED IMMEDIATELY: *FEVER GREATER THAN 100.4 F (38 C) OR HIGHER *CHILLS OR SWEATING *NAUSEA AND VOMITING THAT IS NOT CONTROLLED WITH YOUR NAUSEA MEDICATION *UNUSUAL SHORTNESS OF BREATH *UNUSUAL BRUISING OR BLEEDING *URINARY PROBLEMS (pain or burning when urinating, or frequent urination) *BOWEL PROBLEMS (unusual diarrhea, constipation, pain near the anus) TENDERNESS IN MOUTH AND THROAT WITH OR WITHOUT PRESENCE OF ULCERS (sore throat, sores in mouth, or a toothache) UNUSUAL RASH, SWELLING OR PAIN  UNUSUAL VAGINAL DISCHARGE OR ITCHING   Items with * indicate a potential emergency and should be followed up as soon as possible or go to the Emergency Department if any problems should occur.  Please show the CHEMOTHERAPY ALERT CARD or  IMMUNOTHERAPY ALERT CARD at check-in to the Emergency Department and triage nurse.  Should you have questions after your visit or need to cancel or reschedule your appointment, please contact [facility] [place] [phone]  and follow the prompts.  Office hours are 8:00 a.m. to 4:30 p.m. Monday - Friday. Please note that voicemails left after 4:00 p.m. may not be returned until the following business day.  We are closed weekends and major holidays. You have access to a nurse at all times for urgent questions. Please call the main number to the clinic [phone] and follow the prompts.  For any non-urgent questions, you may also contact your provider using MyChart. We now offer e-Visits for anyone 18 and older to request care online for non-urgent symptoms. For details visit [email or web address].   Also download the MyChart app! Go to the app store, search "MyChart", open the app, select [place], and log in with your MyChart username and password.   

## 2023-03-22 NOTE — Progress Notes (Signed)
Nutrition Follow-up:  Patient with rectal cancer. She is receiving modified FOLFIRINOX q14d (start 2/28)   Met with patient in infusion. She is receiving IV Mg today. Patient reports poor appetite continues. Recalls 2 bites of chicken noodle soup, 4 bites of yogurt, and 2 bites of cottage cheese yesterday. Patient is drinking one Ensure. She usually tolerates this well, however she had episode of vomiting after consuming yesterday. Patient having 6-7 bowel movements. She is taking 2 imodium in the morning, reports taking additional 4-5 through out the day. Patient is drinking ~32 ounces of water. Sometimes she has a sprite.   Medications: lomotil, marinol (4/29)  Labs: Mg 1.5, K 3.1, glucose 149, albumin 2.6  Anthropometrics: Weights continue to trend down. Pt 249 lb today decreased from 252 lb 3.2 oz on 4/17  4/3 - 265 lb 8 oz 3/18 - 268 lb 4.8 oz  3/5 - 276 lb 1.6 oz    NUTRITION DIAGNOSIS: Food and nutrition related knowledge deficit continues    INTERVENTION:  Reviewed strategies for diarrhea, increasing foods with pectin to aid with bulking of stool Pt encouraged to increase Ensure Plus to recommended 3x/day as tolerated  One complimentary case Ensure Plus HP provided Discussed foods high in Mg/K+ - handout with list provided     MONITORING, EVALUATION, GOAL: weight trends, intake    NEXT VISIT: Thursday May 30 via telephone

## 2023-03-22 NOTE — Patient Instructions (Addendum)
[facility] at [facility] **VISIT SUMMARY & IMPORTANT INSTRUCTIONS **   You were seen today by Rojelio Brenner PA-C for your chemotherapy follow-up and symptom management visit.    NAUSEA & VOMITING Take olanzapine 5 mg every night at bedtime Take Compazine 10 mg every 6 hours.   Take Zofran (ondansetron) 4 mg orally dissolving tablets.  Take TWO tablets (8 mg total) every 8 hours in between Compazine doses as needed for breakthrough nausea/vomiting.    DIARRHEA START taking Lomotil every 6 hours for diarrhea. Take Imodium as needed if you continue to have diarrhea despite Lomotil. Take Imodium 2 tablets after your first loose bowel movement of the day.   Take an additional tablet of Imodium after every subsequent loose bowel movement.   Do not take more than 8 tablets (16 mg total) of Imodium in 1 day.   DEHYDRATION & LOW ELECTROLYTES:  Try to drink at least 60 ounces of water daily. We will check labs and give additional IV fluids if needed on Thursday (03/25/23) Continue magnesium oxide 400 mg twice daily. Continue 40 mEq potassium (15 mL liquid) twice daily   WEIGHT LOSS Drink Ensure or Boost as tolerated throughout the day. Try to eat small, frequent high-calorie meals. Follow recommendations as given by nutritionist. New prescription for Marinol (dronabinol) 5 mg twice daily for appetite stimulant (instead of megestrol/Megace)  ** If you have any new or worsening symptoms throughout the duration of your cancer treatment, please do not hesitate to call the Cancer Center at [phone].  Our triage nurse can assist with many common problems, and same-day symptom management visits are available with Rojelio Brenner PA-C as needed.  FOLLOW-UP APPOINTMENT: - Repeat labs with possible IV fluids or blood on Thursday 03/25/2023 - Office visit with Dr. Ellin Saba on Wednesday 03/31/2023.  You will also have labs and start cycle #6 of chemotherapy at that  time.  ** Thank you for trusting me with your healthcare!  I strive to provide all of my patients with quality care at each visit.  If you receive a survey for this visit, I would be so grateful to you for taking the time to provide feedback.  Thank you in advance!  ~ Carleena Mires                   Dr. [name]   &   Rojelio Brenner, PA-C   - - - - - - - - - - - - - - - - - -    Thank you for choosing [facility] at [facility] to provide your oncology and hematology care.  To afford each patient quality time with our provider, please arrive at least 15 minutes before your scheduled appointment time.   If you have a lab appointment with the Cancer Center please come in thru the Main Entrance and check in at the main information desk.  You need to re-schedule your appointment should you arrive 10 or more minutes late.  We strive to give you quality time with our providers, and arriving late affects you and other patients whose appointments are after yours.  Also, if you no show three or more times for appointments you may be dismissed from the clinic at the providers discretion.     Again, thank you for choosing [facility].  Our hope is that these requests will decrease the amount of time that you wait before being seen by our  physicians.       _____________________________________________________________  Should you have questions after your visit to [facility], please contact our office at [phone] and follow the prompts.  Our office hours are 8:00 a.m. and 4:30 p.m. Monday - Friday.  Please note that voicemails left after 4:00 p.m. may not be returned until the following business day.  We are closed weekends and major holidays.  You do have access to a nurse 24-7, just call the main number to the clinic [phone] and do not press any options, hold on the line and a nurse will answer the phone.    For prescription refill  requests, have your pharmacy contact our office and allow 72 hours.

## 2023-03-22 NOTE — Progress Notes (Signed)
Patient asked for diarrhea medication while in the CC due to having four loose stools while in the clinic.  Lomotil 1 tab by mouth verbal order Rojelio Brenner, PA.

## 2023-03-23 ENCOUNTER — Other Ambulatory Visit: Payer: Self-pay

## 2023-03-23 DIAGNOSIS — C20 Malignant neoplasm of rectum: Secondary | ICD-10-CM

## 2023-03-24 ENCOUNTER — Other Ambulatory Visit: Payer: Self-pay | Admitting: *Deleted

## 2023-03-24 MED ORDER — LOPERAMIDE HCL 2 MG PO CAPS: ORAL_CAPSULE | ORAL | 11 refills | Status: DC

## 2023-03-25 ENCOUNTER — Other Ambulatory Visit: Payer: Self-pay | Admitting: *Deleted

## 2023-03-25 DIAGNOSIS — R64 Cachexia: Secondary | ICD-10-CM

## 2023-03-25 DIAGNOSIS — K521 Toxic gastroenteritis and colitis: Secondary | ICD-10-CM

## 2023-03-25 MED ORDER — DRONABINOL 5 MG PO CAPS: 5.0000 mg | ORAL_CAPSULE | Freq: Two times a day (BID) | ORAL | 3 refills | Status: DC

## 2023-03-25 MED ORDER — DIPHENOXYLATE-ATROPINE 2.5-0.025 MG PO TABS
1.0000 | ORAL_TABLET | Freq: Four times a day (QID) | ORAL | 3 refills | Status: DC
Start: 2023-03-25 — End: 2024-04-04

## 2023-03-26 ENCOUNTER — Inpatient Hospital Stay: Payer: Medicare HMO | Attending: Hematology

## 2023-03-26 ENCOUNTER — Inpatient Hospital Stay: Payer: Medicare HMO

## 2023-03-26 VITALS — BP 108/67 | HR 90 | Temp 97.2°F | Resp 18

## 2023-03-26 DIAGNOSIS — E876 Hypokalemia: Secondary | ICD-10-CM

## 2023-03-26 DIAGNOSIS — R197 Diarrhea, unspecified: Secondary | ICD-10-CM

## 2023-03-26 DIAGNOSIS — Z5189 Encounter for other specified aftercare: Secondary | ICD-10-CM | POA: Insufficient documentation

## 2023-03-26 DIAGNOSIS — Z800 Family history of malignant neoplasm of digestive organs: Secondary | ICD-10-CM | POA: Insufficient documentation

## 2023-03-26 DIAGNOSIS — Z95828 Presence of other vascular implants and grafts: Secondary | ICD-10-CM

## 2023-03-26 DIAGNOSIS — E119 Type 2 diabetes mellitus without complications: Secondary | ICD-10-CM | POA: Insufficient documentation

## 2023-03-26 DIAGNOSIS — C20 Malignant neoplasm of rectum: Secondary | ICD-10-CM | POA: Diagnosis present

## 2023-03-26 DIAGNOSIS — D500 Iron deficiency anemia secondary to blood loss (chronic): Secondary | ICD-10-CM | POA: Diagnosis present

## 2023-03-26 DIAGNOSIS — Z5111 Encounter for antineoplastic chemotherapy: Secondary | ICD-10-CM | POA: Diagnosis not present

## 2023-03-26 DIAGNOSIS — G629 Polyneuropathy, unspecified: Secondary | ICD-10-CM | POA: Insufficient documentation

## 2023-03-26 DIAGNOSIS — R110 Nausea: Secondary | ICD-10-CM | POA: Diagnosis not present

## 2023-03-26 DIAGNOSIS — Z79899 Other long term (current) drug therapy: Secondary | ICD-10-CM | POA: Diagnosis not present

## 2023-03-26 DIAGNOSIS — D509 Iron deficiency anemia, unspecified: Secondary | ICD-10-CM | POA: Diagnosis not present

## 2023-03-26 LAB — CBC WITH DIFFERENTIAL/PLATELET
Abs Immature Granulocytes: 0 10*3/uL (ref 0.00–0.07)
Basophils Absolute: 0 10*3/uL (ref 0.0–0.1)
Basophils Relative: 1 %
Eosinophils Absolute: 0 10*3/uL (ref 0.0–0.5)
Eosinophils Relative: 3 %
HCT: 21.4 % — ABNORMAL LOW (ref 36.0–46.0)
Hemoglobin: 6.9 g/dL — CL (ref 12.0–15.0)
Lymphocytes Relative: 30 %
Lymphs Abs: 0.5 10*3/uL — ABNORMAL LOW (ref 0.7–4.0)
MCH: 28.9 pg (ref 26.0–34.0)
MCHC: 32.2 g/dL (ref 30.0–36.0)
MCV: 89.5 fL (ref 80.0–100.0)
Monocytes Absolute: 0.2 10*3/uL (ref 0.1–1.0)
Monocytes Relative: 12 %
Neutro Abs: 0.9 10*3/uL — ABNORMAL LOW (ref 1.7–7.7)
Neutrophils Relative %: 54 %
Platelets: 59 10*3/uL — ABNORMAL LOW (ref 150–400)
RBC: 2.39 MIL/uL — ABNORMAL LOW (ref 3.87–5.11)
RDW: 23 % — ABNORMAL HIGH (ref 11.5–15.5)
WBC: 1.6 10*3/uL — ABNORMAL LOW (ref 4.0–10.5)
nRBC: 0 % (ref 0.0–0.2)

## 2023-03-26 LAB — TYPE AND SCREEN
ABO/RH(D): A POS
Antibody Screen: NEGATIVE

## 2023-03-26 LAB — BASIC METABOLIC PANEL
Anion gap: 9 (ref 5–15)
BUN: 7 mg/dL — ABNORMAL LOW (ref 8–23)
CO2: 21 mmol/L — ABNORMAL LOW (ref 22–32)
Calcium: 8.4 mg/dL — ABNORMAL LOW (ref 8.9–10.3)
Chloride: 105 mmol/L (ref 98–111)
Creatinine, Ser: 0.74 mg/dL (ref 0.44–1.00)
GFR, Estimated: >60 mL/min (ref >60)
Glucose, Bld: 148 mg/dL — ABNORMAL HIGH (ref 70–99)
Potassium: 3.2 mmol/L — ABNORMAL LOW (ref 3.5–5.1)
Sodium: 135 mmol/L (ref 135–145)

## 2023-03-26 LAB — MAGNESIUM: Magnesium: 1.6 mg/dL — ABNORMAL LOW (ref 1.7–2.4)

## 2023-03-26 LAB — SAMPLE TO BLOOD BANK

## 2023-03-26 LAB — BPAM RBC
Blood Product Expiration Date: 202405252359
Blood Product Expiration Date: 202405252359
Unit Type and Rh: 6200

## 2023-03-26 LAB — PREPARE RBC (CROSSMATCH)

## 2023-03-26 MED ORDER — SODIUM CHLORIDE 0.9% IV SOLUTION
250.0000 mL | Freq: Once | INTRAVENOUS | Status: AC
  Administered 2023-03-26: 250 mL via INTRAVENOUS

## 2023-03-26 MED ORDER — MAGNESIUM SULFATE 2 GM/50ML IV SOLN
2.0000 g | Freq: Once | INTRAVENOUS | Status: AC
  Administered 2023-03-26: 2 g via INTRAVENOUS
  Filled 2023-03-26: qty 50

## 2023-03-26 MED ORDER — SODIUM CHLORIDE 0.9% FLUSH
10.0000 mL | INTRAVENOUS | Status: DC | PRN
  Administered 2023-03-26: 10 mL via INTRAVENOUS

## 2023-03-26 MED ORDER — POTASSIUM CHLORIDE IN NACL 40-0.9 MEQ/L-% IV SOLN
Freq: Once | INTRAVENOUS | Status: AC
  Filled 2023-03-26: qty 1000

## 2023-03-26 MED ORDER — HEPARIN SOD (PORK) LOCK FLUSH 100 UNIT/ML IV SOLN
500.0000 [IU] | Freq: Every day | INTRAVENOUS | Status: AC | PRN
  Administered 2023-03-26: 500 [IU]

## 2023-03-26 MED ORDER — DIPHENOXYLATE-ATROPINE 2.5-0.025 MG PO TABS
1.0000 | ORAL_TABLET | Freq: Once | ORAL | Status: AC
  Administered 2023-03-26: 1 via ORAL
  Filled 2023-03-26: qty 1

## 2023-03-26 MED ORDER — SODIUM CHLORIDE 0.9% FLUSH
10.0000 mL | INTRAVENOUS | Status: AC | PRN
  Administered 2023-03-26: 10 mL

## 2023-03-26 NOTE — Progress Notes (Signed)
Chaplain engaged in a follow-up visit with [facility]. [name] said she is excited about her treatments coming to an end soon. Chaplain affirmed her excitement. She said she is still primarily drinking ensures and hasn't gained her appetite back, but she is glad that she doesn't feel any better or worse.   Chaplain provided support and prayer.     03/26/23 1100  Spiritual Encounters  Type of Visit Follow up  Care provided to: Patient

## 2023-03-26 NOTE — Progress Notes (Signed)
CRITICAL VALUE ALERT Critical value received:  HGB 6.9 Date of notification:  03-26-2023 Time of notification: 0938 am.  Critical value read back:  Yes.   Nurse who received alert:  B.Adrianna Dudas RN.  MD notified time and response:  Dr. Ellin Saba @ [phone] am. Patient will receive 2 units of PRBC's today.

## 2023-03-26 NOTE — Progress Notes (Signed)
Patient presents today for possible fluids today. Patient is in satisfactory condition with no new complaints voiced.  Vital signs are stable.  We will proceed with infusion per provider orders.    Patient's magnesium 1.6, Potassium 3.2, HGB 6.9. Patient getting 2g of magnesium IV, 1L of NS with 40 mEq of potassium IV, and 2 units of PRBC.   Patient tolerated treatment well with no complaints voiced.  Patient via wheelchair with family stable condition.  Vital signs stable at discharge.  Follow up as scheduled.

## 2023-03-26 NOTE — Progress Notes (Signed)
Patients port flushed without difficulty.  Good blood return noted with no bruising or swelling noted at site.  Patient remains accessed for possible IVF.   

## 2023-03-26 NOTE — Progress Notes (Signed)
Patient presents today for possible IVF.  Patient is in satisfactory condition with no new complaints voiced.  Vital signs are stable.  Labs reviewed.

## 2023-03-26 NOTE — Patient Instructions (Signed)
[facility]  Discharge Instructions: Thank you for choosing [facility] to provide your oncology and hematology care.  If you have a lab appointment with the Cancer Center - please note that after April 8th, 2024, all labs will be drawn in the cancer center.  You do not have to check in or register with the main entrance as you have in the past but will complete your check-in in the cancer center.  Wear comfortable clothing and clothing appropriate for easy access to any Portacath or PICC line.   We strive to give you quality time with your provider. You may need to reschedule your appointment if you arrive late (15 or more minutes).  Arriving late affects you and other patients whose appointments are after yours.  Also, if you miss three or more appointments without notifying the office, you may be dismissed from the clinic at the provider's discretion.      For prescription refill requests, have your pharmacy contact our office and allow 72 hours for refills to be completed.    Today you received the following Magnesium, PRBC x2 units, and NS with 40 mEq of potassium.    To help prevent nausea and vomiting after your treatment, we encourage you to take your nausea medication as directed.  BELOW ARE SYMPTOMS THAT SHOULD BE REPORTED IMMEDIATELY: *FEVER GREATER THAN 100.4 F (38 C) OR HIGHER *CHILLS OR SWEATING *NAUSEA AND VOMITING THAT IS NOT CONTROLLED WITH YOUR NAUSEA MEDICATION *UNUSUAL SHORTNESS OF BREATH *UNUSUAL BRUISING OR BLEEDING *URINARY PROBLEMS (pain or burning when urinating, or frequent urination) *BOWEL PROBLEMS (unusual diarrhea, constipation, pain near the anus) TENDERNESS IN MOUTH AND THROAT WITH OR WITHOUT PRESENCE OF ULCERS (sore throat, sores in mouth, or a toothache) UNUSUAL RASH, SWELLING OR PAIN  UNUSUAL VAGINAL DISCHARGE OR ITCHING   Items with * indicate a potential emergency and should be followed up as soon as possible or go to  the Emergency Department if any problems should occur.  Please show the CHEMOTHERAPY ALERT CARD or IMMUNOTHERAPY ALERT CARD at check-in to the Emergency Department and triage nurse.  Should you have questions after your visit or need to cancel or reschedule your appointment, please contact [facility] AT [facility] [phone]  and follow the prompts.  Office hours are 8:00 a.m. to 4:30 p.m. Monday - Friday. Please note that voicemails left after 4:00 p.m. may not be returned until the following business day.  We are closed weekends and major holidays. You have access to a nurse at all times for urgent questions. Please call the main number to the clinic [phone] and follow the prompts.  For any non-urgent questions, you may also contact your provider using MyChart. We now offer e-Visits for anyone 31 and older to request care online for non-urgent symptoms. For details visit [email or web address].   Also download the MyChart app! Go to the app store, search "MyChart", open the app, select [place], and log in with your MyChart username and password.

## 2023-03-28 LAB — TYPE AND SCREEN
Unit division: 0
Unit division: 0

## 2023-03-28 LAB — BPAM RBC
ISSUE DATE / TIME: 202405031011
ISSUE DATE / TIME: 202405031145
Unit Type and Rh: 6200

## 2023-03-30 NOTE — Progress Notes (Signed)
[name] [address] [place] [address]    Clinic Day:  03/31/2023  Referring physician: Gwenlyn Found, MD  Patient Care Team: Carrie Found, MD as PCP - General (Family Medicine) Carrie Sidle, MD as PCP - Cardiology (Cardiology) Carrie Gauss Gerrit Friends, MD as Consulting Physician (Gastroenterology) Carrie Massed, MD as Medical Oncologist (Medical Oncology) Carrie Sarah, RN as Oncology Nurse Navigator (Medical Oncology)   ASSESSMENT & PLAN:   Assessment: 1.  Stage IIc (T3d/4b N0 M0) rectal cancer: - Rectal bleeding for the past 4 to 5 years. - Colonoscopy (12/16/2022): Tumor protruding through the anal orifice.  Bulky semilunar lateral rectal tumor extending from the anorectal junction proximally about 13 cm. 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  MSI-high not detected by [id number] - CT CAP (12/17/2022): Locally advanced low rectal primary without bowel obstruction or nodal metastasis.  Isolated right middle lobe subpleural lung nodule most likely benign.  Cirrhosis and portal venous hypertension.  2.6 cm right renal lesion with differential hemorrhagic/proteinaceous cyst or solid neoplasm. - PET scan (12/31/2022): Markedly hypermetabolic rectal lesion with no evidence for perirectal or pelvic lymphadenopathy.  No metastatic disease in the neck, chest, abdomen or pelvis.  Focal hypermetabolism in the region of the gallbladder neck corresponds to subtle 13 mm focus of increased attenuation in the lumen of the gallbladder.  Differential includes gallbladder polyp/neoplasm.  2.7 cm exophytic posterior right interpolar renal lesion shows no substantial hypermetabolism, likely cyst.  2.3 cm inferior right thyroid nodule without hypermetabolism. - MRI pelvis (12/24/2022): Extension through muscularis propria.  Tumor size 3.8 x 3 x 5.8 cm with a volume 35 cm.  T stage is T3d, probable T4.  Suspicion  of involvement of pelvic floor musculature and the far posterior and left side of the vagina.   2.  Social/family history: - She lives at home with her family.  She is accompanied by her daughter today.  She is independent of ADLs and IADLs.  She is a retired Production designer, theatre/television/film at [employer].  Non-smoker.  She reports having hysterectomy in 1986 for cancerous polyps in her endometrium. - Maternal grandmother's sister had colon cancer.    Plan: 1.  Stage IIc (T3d/4BN0M0) rectal cancer, MSI stable: - She had tiredness, diarrhea and decreased appetite after cycle 5. - Reviewed labs today: Normal LFTs and creatinine.  CBC grossly normal with mild thrombocytopenia. - She may proceed with cycle 6 today.  We will continue to give her fluids twice weekly.  As she is having poor tolerance to chemotherapy, I have recommended obtaining MRI of the pelvis in 2 weeks.  If she has excellent response, we may forego or significantly dose reduce last 2 cycles of chemotherapy. - We prescribed her Marinol for appetite.  She could not get it filled because of the nationwide shortage.  She tried Megace which did not help.   2.  Peripheral neuropathy from diabetes: - She has no feeling in her toes.  She is not taking Lyrica.  No worsening of neuropathy since chemo started.   3.  Severe microcytic anemia: - Rectal bleeding stopped after cycle 2.  Last Venofer on 02/23/2023.  She received 2 units PRBC on 03/26/2023.  Hemoglobin today is 11.4.   4.  Hypokalemia: - Continue K-Lor 40 mEq twice daily.  She will have potassium repleted in the clinic today.   5.  Hypomagnesemia: - Continue magnesium twice daily.  She will  have IV magnesium today.  6.  Diarrhea: - She has 4-6 stools per day. - She is taking Imodium 2 tablets after the first watery bowel movement followed by 1 tablet after each watery bowel movement.  She is taking Lomotil 1 tablet 4 times daily. - I have recommended that she increase Lomotil to 2 tablets 4  times daily.    Orders Placed This Encounter  Procedures   MR PELVIS WO CM RECTAL CA STAGING    Standing Status:   Future    Standing Expiration Date:   03/30/2024    Order Specific Question:   If indicated for the ordered procedure, I authorize the administration of contrast media per Radiology protocol    Answer:   Yes    Order Specific Question:   What is the patient's sedation requirement?    Answer:   No Sedation    Order Specific Question:   Does the patient have a pacemaker or implanted devices?    Answer:   No    Order Specific Question:   Preferred imaging location?    Answer:   [facility] (table limit - 550lbs)      I,[name],acting as a scribe for Carrie Massed, MD.,have documented all relevant documentation on the behalf of Carrie Massed, MD,as directed by  Carrie Massed, MD while in the presence of Carrie Massed, MD.   I, Carrie Massed MD, have reviewed the above documentation for accuracy and completeness, and I agree with the above.   Carrie Massed, MD   5/8/20245:35 PM  CHIEF COMPLAINT:   Diagnosis: stage IIc (T4b N0) low rectal cancer    Cancer Staging  Rectal cancer [facility]) Staging form: Colon and Rectum, AJCC 8th Edition - Clinical stage from 01/03/2023: Stage IIC (cT4b, cN0, cM0) - Unsigned    Prior Therapy: none  Current Therapy:  FOLFIRINOX    HISTORY OF PRESENT ILLNESS:   Oncology History  Rectal cancer (HCC)  01/03/2023 Initial Diagnosis   Rectal cancer (HCC)   01/20/2023 -  Chemotherapy   Patient is on Treatment Plan : RECTAL Modified FOLFIRINOX q14d x 8 cycles        INTERVAL HISTORY:   [name] is a 67 y.o. female presenting to clinic today for follow up of stage IIc (T4b N0) low rectal cancer. She was last seen by me on 03/17/23. She was also seen by PA [name] on 03/22/23 in the symptom management clinic.  Today, she states that she is doing well overall. Her appetite level is at 20%.  Her energy level is at 10%.  PAST MEDICAL HISTORY:   Past Medical History: Past Medical History:  Diagnosis Date   Anxiety    Cancer (HCC)    Coronary atherosclerosis    Cardiac CT 09/2018 with calcium score 8.7 and mild proximal LAD disease   Essential hypertension 2009   GERD (gastroesophageal reflux disease)    Hypothyroidism    Iron deficiency anemia    Osteoarthritis    Palpitations    Tachypalpitations on beta blockers since 2010   Type 2 diabetes mellitus (HCC) 2003   Vitamin B 12 deficiency    Vitamin D deficiency     Surgical History: Past Surgical History:  Procedure Laterality Date   ABDOMINAL HYSTERECTOMY     History of cervical cancer   BIOPSY  12/16/2022   Procedure: BIOPSY;  Surgeon: Corbin Ade, MD;  [facility];  Service: Endoscopy;;  gastric, rectal   BREAST BIOPSY Right  COLONOSCOPY WITH PROPOFOL N/A 12/16/2022   Procedure: COLONOSCOPY WITH PROPOFOL;  Surgeon: Corbin Ade, MD;  [facility];  Service: Endoscopy;  Laterality: N/A;  10:45 am   ESOPHAGOGASTRODUODENOSCOPY (EGD) WITH PROPOFOL N/A 12/16/2022   Procedure: ESOPHAGOGASTRODUODENOSCOPY (EGD) WITH PROPOFOL;  Surgeon: Corbin Ade, MD;  [facility];  Service: Endoscopy;  Laterality: N/A;   IR IMAGING GUIDED PORT INSERTION  01/15/2023   MALONEY DILATION N/A 12/16/2022   Procedure: Elease Hashimoto DILATION;  Surgeon: Corbin Ade, MD;  [facility];  Service: Endoscopy;  Laterality: N/A;   POLYPECTOMY  12/16/2022   Procedure: POLYPECTOMY;  Surgeon: Corbin Ade, MD;  [facility];  Service: Endoscopy;;   TONSILLECTOMY      Social History: Social History   Socioeconomic History   Marital status: Single    Spouse name: Not on file   Number of children: 2   Years of education: Not on file   Highest education level: Not on file  Occupational History   Occupation: DISABLED    Employer: UNEMPLOYED    Comment: [employer]  Tobacco  Use   Smoking status: Never   Smokeless tobacco: Never  Vaping Use   Vaping Use: Never used  Substance and Sexual Activity   Alcohol use: No   Drug use: No   Sexual activity: Never  Other Topics Concern   Not on file  Social History Narrative   Has 2 grandchildren. 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   Social Determinants of Health   Financial Resource Strain: Not on file  Food Insecurity: No Food Insecurity (01/06/2023)   Hunger Vital Sign    Worried About Running Out of Food in the Last Year: Never true    Ran Out of Food in the Last Year: Never true  Transportation Needs: No Transportation Needs (01/06/2023)   PRAPARE - Administrator, Civil Service (Medical): No    Lack of Transportation (Non-Medical): No  Physical Activity: Not on file  Stress: Not on file  Social Connections: Not on file  Intimate Partner Violence: Not At Risk (01/06/2023)   Humiliation, Afraid, Rape, and Kick questionnaire    Fear of Current or Ex-Partner: No    Emotionally Abused: No    Physically Abused: No    Sexually Abused: No    Family History: Family History  Problem Relation Age of Onset   Heart failure Mother    Diabetes Father    Hypertension Father    Stroke Sister 76   Diabetes Brother    Cancer - Colon Other        age 60   Colon polyps Neg Hx     Current Medications:  Current Outpatient Medications:    blood glucose meter kit and supplies KIT, Dispense based on patient and insurance preference. Use up to four times daily as directed. (FOR ICD-10 E11.65) Number of Strips 400 Number of Lancets 400, Disp: 1 each, Rfl: 0   Blood Glucose Monitoring Suppl (TRUE METRIX METER) w/Device KIT, 1 each by Does not apply route 2 (two) times daily. Use to test BG bid E11.65, Disp: 1 kit, Rfl: 0   Cholecalciferol (VITAMIN D3) 5000 units TABS, Take 5,000 Units by mouth daily., Disp: , Rfl:    cyanocobalamin (VITAMIN B12) 1000 MCG/ML injection, B12 shots at a  frequency of once weekly for 4 weeks then can drop down to once monthly thereafter., Disp: , Rfl:  cyclobenzaprine (FLEXERIL) 10 MG tablet, Take 10 mg by mouth 3 (three) times daily as needed for muscle spasms., Disp: , Rfl:    dextrose 5 % SOLN 1,000 mL with fluorouracil 5 GM/100ML SOLN, Inject into the vein over 48 hr. Every 14 days, Disp: , Rfl:    diphenoxylate-atropine (LOMOTIL) 2.5-0.025 MG tablet, Take 1 tablet by mouth 4 (four) times daily., Disp: 360 tablet, Rfl: 3   docusate sodium (COLACE) 100 MG capsule, Take 1 capsule (100 mg total) by mouth 2 (two) times daily. Do NOT take this if you have diarrhea.  Try to maintain soft easy to pass bowel movements., Disp: 10 capsule, Rfl: 0   DROPLET PEN NEEDLES 31G X 5 MM MISC, USE AS INSTRUCTED TO INJECT INSULIN DAILY, Disp: 100 each, Rfl: 3   FLUOROURACIL IV, Inject into the vein every 14 (fourteen) days., Disp: , Rfl:    FLUoxetine (PROZAC) 20 MG capsule, Take 20 mg by mouth daily., Disp: , Rfl:    folic acid (FOLVITE) 1 MG tablet, Take 1 tablet (1 mg total) by mouth daily., Disp: 30 tablet, Rfl: 6   furosemide (LASIX) 20 MG tablet, Take 1 tablet (20 mg total) by mouth daily as needed. Take extra potassium days that lasix is needed, Disp: 30 tablet, Rfl: 2   HYDROcodone-acetaminophen (NORCO) 5-325 MG tablet, Take 1 tablet by mouth every 6 (six) hours as needed for severe pain., Disp: 30 tablet, Rfl: 0   insulin aspart (NOVOLOG FLEXPEN) 100 UNIT/ML FlexPen, Inject 0-6 Units into the skin 3 (three) times daily with meals., Disp: 15 mL, Rfl: 0   insulin glargine (LANTUS SOLOSTAR) 100 UNIT/ML Solostar Pen, Inject 40 Units into the skin at bedtime., Disp: 45 mL, Rfl: 0   IRINOTECAN HCL IV, Inject into the vein every 14 (fourteen) days., Disp: , Rfl:    LEUCOVORIN CALCIUM IV, Inject into the vein every 14 (fourteen) days., Disp: , Rfl:    levothyroxine (SYNTHROID) 88 MCG tablet, TAKE 1 TABLET EVERY DAY BEFORE BREAKFAST, Disp: 90 tablet, Rfl: 1    loperamide (IMODIUM) 2 MG capsule, TAKE 1 CAPSULE AS DIRECTED AS NEEDED FOR DIARRHEA OR LOOSE STOOLS, Disp: 30 capsule, Rfl: 11   magnesium oxide (MAG-OX) 400 (240 Mg) MG tablet, Take 1 tablet (400 mg total) by mouth 2 (two) times daily., Disp: 60 tablet, Rfl: 2   OLANZapine (ZYPREXA) 5 MG tablet, Take 1 tablet (5 mg total) by mouth at bedtime., Disp: 30 tablet, Rfl: 2   ondansetron (ZOFRAN-ODT) 4 MG disintegrating tablet, TAKE 2 TABS EVERY 8 HRS AS NEEDED FOR REFRACTORY NAUSEA/ VOMITING (BETWEEN COMPAZINE DOSES FOR MAX SYMPTOM RELIEF), Disp: 60 tablet, Rfl: 1   OXALIPLATIN IV, Inject into the vein every 14 (fourteen) days., Disp: , Rfl:    pantoprazole (PROTONIX) 40 MG tablet, Take 1 tablet (40 mg total) by mouth 2 (two) times daily before a meal., Disp: 60 tablet, Rfl: 3   Potassium Chloride 40 MEQ/15ML (20%) SOLN, Take 15 mLs by mouth in the morning and at bedtime., Disp: 900 mL, Rfl: 2   pregabalin (LYRICA) 100 MG capsule, Take 1 capsule (100 mg total) by mouth 2 (two) times daily., Disp: 180 capsule, Rfl: 1   prochlorperazine (COMPAZINE) 10 MG tablet, TAKE 1 TABLET EVERY 6 HOURS AS NEEDED FOR NAUSEA OR VOMITING, Disp: 30 tablet, Rfl: 3   sucralfate (CARAFATE) 1 GM/10ML suspension, Take 10 mLs (1 g total) by mouth 4 (four) times daily. Swish and swallow, Disp: 420 mL, Rfl: 2  TRUE METRIX BLOOD GLUCOSE TEST test strip, TEST BLOOD SUGAR TWICE DAILY, Disp: 200 strip, Rfl: 3   dronabinol (MARINOL) 5 MG capsule, Take 1 capsule (5 mg total) by mouth 2 (two) times daily before a meal., Disp: 180 capsule, Rfl: 3 No current facility-administered medications for this visit.  Facility-Administered Medications Ordered in Other Visits:    0.9 %  sodium chloride infusion, , Intravenous, Continuous, Carrie Massed, MD, Stopped at 02/17/23 1535   fluorouracil (ADRUCIL) 4,300 mg in sodium chloride 0.9 % 64 mL chemo infusion, 4,300 mg, Intravenous, 1 day or 1 dose, Carrie Massed, MD, Infusion Verify  at 03/31/23 1602   Allergies: Allergies  Allergen Reactions   Contrast Media [Iodinated Contrast Media] Other (See Comments)    Burning   Sulfa Antibiotics Nausea And Vomiting    Other reaction(s): GI Upset (intolerance) unknown   Doxepin Rash and Swelling   Tape Rash    REVIEW OF SYSTEMS:   Review of Systems  Constitutional:  Positive for fatigue. Negative for chills and fever.  HENT:   Negative for lump/mass, mouth sores, nosebleeds, sore throat and trouble swallowing.   Eyes:  Negative for eye problems.  Respiratory:  Negative for cough and shortness of breath.   Cardiovascular:  Positive for leg swelling. Negative for chest pain and palpitations.  Gastrointestinal:  Positive for diarrhea. Negative for abdominal pain, constipation, nausea and vomiting.  Genitourinary:  Negative for bladder incontinence, difficulty urinating, dysuria, frequency, hematuria and nocturia.   Musculoskeletal:  Negative for arthralgias, back pain, flank pain, myalgias and neck pain.  Skin:  Negative for itching and rash.  Neurological:  Negative for dizziness, headaches and numbness.  Hematological:  Does not bruise/bleed easily.  Psychiatric/Behavioral:  Positive for sleep disturbance. Negative for depression and suicidal ideas. The patient is not nervous/anxious.   All other systems reviewed and are negative.    VITALS:   Blood pressure 110/84, pulse (!) 103, temperature 97.6 F (36.4 C), temperature source Oral, resp. rate 20, weight 245 lb (111.1 kg), SpO2 96 %.  Wt Readings from Last 3 Encounters:  03/31/23 236 lb 12.4 oz (107.4 kg)  03/31/23 245 lb (111.1 kg)  03/22/23 249 lb (112.9 kg)    Body mass index is 38.37 kg/m.  Performance status (ECOG): 1 - Symptomatic but completely ambulatory  PHYSICAL EXAM:   Physical Exam Vitals and nursing note reviewed. Exam conducted with a chaperone present.  Constitutional:      Appearance: Normal appearance.  Cardiovascular:     Rate and  Rhythm: Normal rate and regular rhythm.     Pulses: Normal pulses.     Heart sounds: Normal heart sounds.  Pulmonary:     Effort: Pulmonary effort is normal.     Breath sounds: Normal breath sounds.  Abdominal:     Palpations: Abdomen is soft. There is no hepatomegaly, splenomegaly or mass.     Tenderness: There is no abdominal tenderness.  Musculoskeletal:     Right lower leg: No edema.     Left lower leg: No edema.  Lymphadenopathy:     Cervical: No cervical adenopathy.     Right cervical: No superficial, deep or posterior cervical adenopathy.    Left cervical: No superficial, deep or posterior cervical adenopathy.     Upper Body:     Right upper body: No supraclavicular or axillary adenopathy.     Left upper body: No supraclavicular or axillary adenopathy.  Neurological:     General: No focal deficit present.  Mental Status: She is alert and oriented to person, place, and time.  Psychiatric:        Mood and Affect: Mood normal.        Behavior: Behavior normal.     LABS:      Latest Ref Rng & Units 03/31/2023    8:39 AM 03/26/2023    8:41 AM 03/22/2023    8:59 AM  CBC  WBC 4.0 - 10.5 K/uL 6.6  1.6  16.7   Hemoglobin 12.0 - 15.0 g/dL 16.1  6.9  8.2   Hematocrit 36.0 - 46.0 % 34.9  21.4  25.5   Platelets 150 - 400 K/uL 136  59  124       Latest Ref Rng & Units 03/31/2023    8:39 AM 03/26/2023    8:41 AM 03/22/2023    8:59 AM  CMP  Glucose 70 - 99 mg/dL 096  045  409   BUN 8 - 23 mg/dL 6  7  11    Creatinine 0.44 - 1.00 mg/dL 8.11  9.14  7.82   Sodium 135 - 145 mmol/L 137  135  137   Potassium 3.5 - 5.1 mmol/L 2.5  3.2  3.1   Chloride 98 - 111 mmol/L 104  105  105   CO2 22 - 32 mmol/L 22  21  24    Calcium 8.9 - 10.3 mg/dL 8.2  8.4  8.3   Total Protein 6.5 - 8.1 g/dL 5.8   5.5   Total Bilirubin 0.3 - 1.2 mg/dL 0.9   0.8   Alkaline Phos 38 - 126 U/L 101   115   AST 15 - 41 U/L 22   35   ALT 0 - 44 U/L 13   15      Lab Results  Component Value Date   CEA1 3.8  12/16/2022   /  CEA  Date Value Ref Range Status  12/16/2022 3.8 0.0 - 4.7 ng/mL Final    Comment:    (NOTE)                             Nonsmokers          <3.9                             Smokers             <5.6 Roche [employer] Electrochemiluminescence Immunoassay (ECLIA) Values obtained with different assay methods or kits cannot be used interchangeably.  Results cannot be interpreted as absolute evidence of the presence or absence of malignant disease. Performed At: [facility] [address] [place], [place] [id number] Jolene Schimke MD VH:[id number]    No results Manning for: "PSA1" No results Manning for: "CAN199" No results Manning for: "CAN125"  No results Manning for: "TOTALPROTELP", "ALBUMINELP", "A1GS", "A2GS", "BETS", "BETA2SER", "GAMS", "MSPIKE", "SPEI" Lab Results  Component Value Date   TIBC 370 01/04/2023   FERRITIN 4 (L) 01/04/2023   IRONPCTSAT 4 (L) 01/04/2023   Lab Results  Component Value Date   LDH 136 01/04/2023     STUDIES:   No results Manning.

## 2023-03-31 ENCOUNTER — Inpatient Hospital Stay: Payer: Medicare HMO

## 2023-03-31 ENCOUNTER — Encounter: Payer: Self-pay | Admitting: Hematology

## 2023-03-31 ENCOUNTER — Inpatient Hospital Stay (HOSPITAL_BASED_OUTPATIENT_CLINIC_OR_DEPARTMENT_OTHER): Payer: Medicare HMO | Admitting: Hematology

## 2023-03-31 VITALS — BP 116/74 | HR 73 | Temp 98.8°F | Resp 20 | Wt 236.8 lb

## 2023-03-31 DIAGNOSIS — Z5111 Encounter for antineoplastic chemotherapy: Secondary | ICD-10-CM | POA: Diagnosis not present

## 2023-03-31 DIAGNOSIS — C20 Malignant neoplasm of rectum: Secondary | ICD-10-CM

## 2023-03-31 DIAGNOSIS — E876 Hypokalemia: Secondary | ICD-10-CM

## 2023-03-31 DIAGNOSIS — Z95828 Presence of other vascular implants and grafts: Secondary | ICD-10-CM

## 2023-03-31 LAB — CBC WITH DIFFERENTIAL/PLATELET
Abs Immature Granulocytes: 0.1 10*3/uL — ABNORMAL HIGH (ref 0.00–0.07)
Band Neutrophils: 2 %
Basophils Absolute: 0 10*3/uL (ref 0.0–0.1)
Basophils Relative: 0 %
Eosinophils Absolute: 0.1 10*3/uL (ref 0.0–0.5)
Eosinophils Relative: 1 %
HCT: 34.9 % — ABNORMAL LOW (ref 36.0–46.0)
Hemoglobin: 11.4 g/dL — ABNORMAL LOW (ref 12.0–15.0)
Lymphocytes Relative: 21 %
Lymphs Abs: 1.4 10*3/uL (ref 0.7–4.0)
MCH: 29.5 pg (ref 26.0–34.0)
MCHC: 32.7 g/dL (ref 30.0–36.0)
MCV: 90.4 fL (ref 80.0–100.0)
Metamyelocytes Relative: 1 %
Monocytes Absolute: 0.8 10*3/uL (ref 0.1–1.0)
Monocytes Relative: 12 %
Neutro Abs: 4.3 10*3/uL (ref 1.7–7.7)
Neutrophils Relative %: 63 %
Platelets: 136 10*3/uL — ABNORMAL LOW (ref 150–400)
RBC: 3.86 MIL/uL — ABNORMAL LOW (ref 3.87–5.11)
RDW: 24 % — ABNORMAL HIGH (ref 11.5–15.5)
WBC: 6.6 10*3/uL (ref 4.0–10.5)
nRBC: 1.4 % — ABNORMAL HIGH (ref 0.0–0.2)

## 2023-03-31 LAB — COMPREHENSIVE METABOLIC PANEL
ALT: 13 U/L (ref 0–44)
AST: 22 U/L (ref 15–41)
Albumin: 2.8 g/dL — ABNORMAL LOW (ref 3.5–5.0)
Alkaline Phosphatase: 101 U/L (ref 38–126)
Anion gap: 11 (ref 5–15)
BUN: 6 mg/dL — ABNORMAL LOW (ref 8–23)
CO2: 22 mmol/L (ref 22–32)
Calcium: 8.2 mg/dL — ABNORMAL LOW (ref 8.9–10.3)
Chloride: 104 mmol/L (ref 98–111)
Creatinine, Ser: 0.82 mg/dL (ref 0.44–1.00)
GFR, Estimated: >60 mL/min (ref >60)
Glucose, Bld: 139 mg/dL — ABNORMAL HIGH (ref 70–99)
Potassium: 2.5 mmol/L — CL (ref 3.5–5.1)
Sodium: 137 mmol/L (ref 135–145)
Total Bilirubin: 0.9 mg/dL (ref 0.3–1.2)
Total Protein: 5.8 g/dL — ABNORMAL LOW (ref 6.5–8.1)

## 2023-03-31 LAB — MAGNESIUM: Magnesium: 1.4 mg/dL — ABNORMAL LOW (ref 1.7–2.4)

## 2023-03-31 LAB — SAMPLE TO BLOOD BANK

## 2023-03-31 MED ORDER — POTASSIUM CHLORIDE 10 MEQ/100ML IV SOLN
10.0000 meq | INTRAVENOUS | Status: AC
  Administered 2023-03-31 (×2): 10 meq via INTRAVENOUS
  Filled 2023-03-31 (×2): qty 100

## 2023-03-31 MED ORDER — POTASSIUM CHLORIDE 20 MEQ PO PACK
40.0000 meq | PACK | ORAL | Status: AC
  Administered 2023-03-31 (×2): 40 meq via ORAL
  Filled 2023-03-31: qty 2

## 2023-03-31 MED ORDER — MAGNESIUM SULFATE 2 GM/50ML IV SOLN
2.0000 g | Freq: Once | INTRAVENOUS | Status: AC
  Administered 2023-03-31: 2 g via INTRAVENOUS
  Filled 2023-03-31: qty 50

## 2023-03-31 MED ORDER — HEPARIN SOD (PORK) LOCK FLUSH 100 UNIT/ML IV SOLN: 500.0000 [IU] | Freq: Once | INTRAVENOUS | Status: DC | PRN

## 2023-03-31 MED ORDER — POTASSIUM CHLORIDE 10 MEQ/100ML IV SOLN: 10.0000 meq | INTRAVENOUS | Status: DC

## 2023-03-31 MED ORDER — SODIUM CHLORIDE 0.9 % IV SOLN
150.0000 mg/m2 | Freq: Once | INTRAVENOUS | Status: DC
  Administered 2023-03-31: 360 mg via INTRAVENOUS
  Filled 2023-03-31: qty 18

## 2023-03-31 MED ORDER — SODIUM CHLORIDE 0.9 % IV SOLN
400.0000 mg/m2 | Freq: Once | INTRAVENOUS | Status: DC
  Administered 2023-03-31: 948 mg via INTRAVENOUS
  Filled 2023-03-31: qty 47.4

## 2023-03-31 MED ORDER — ATROPINE SULFATE 1 MG/ML IV SOLN: 0.5000 mg | Freq: Once | INTRAVENOUS | Status: DC | PRN

## 2023-03-31 MED ORDER — SODIUM CHLORIDE 0.9 % IV SOLN
150.0000 mg | Freq: Once | INTRAVENOUS | Status: AC
  Administered 2023-03-31: 150 mg via INTRAVENOUS
  Filled 2023-03-31: qty 5

## 2023-03-31 MED ORDER — SODIUM CHLORIDE 0.9% FLUSH
10.0000 mL | Freq: Once | INTRAVENOUS | Status: AC
  Administered 2023-03-31: 10 mL via INTRAVENOUS

## 2023-03-31 MED ORDER — SODIUM CHLORIDE 0.9 % IV SOLN: INTRAVENOUS | Status: DC

## 2023-03-31 MED ORDER — SODIUM CHLORIDE 0.9 % IV SOLN
10.0000 mg | Freq: Once | INTRAVENOUS | Status: AC
  Administered 2023-03-31: 10 mg via INTRAVENOUS
  Filled 2023-03-31: qty 10

## 2023-03-31 MED ORDER — ATROPINE SULFATE 1 MG/ML IV SOLN
0.5000 mg | Freq: Once | INTRAVENOUS | Status: AC
  Administered 2023-03-31: 0.5 mg via INTRAVENOUS
  Filled 2023-03-31: qty 1

## 2023-03-31 MED ORDER — SODIUM CHLORIDE 0.9 % IV SOLN
4300.0000 mg | INTRAVENOUS | Status: DC
  Filled 2023-03-31: qty 86

## 2023-03-31 MED ORDER — SODIUM CHLORIDE 0.9% FLUSH: 10.0000 mL | INTRAVENOUS | Status: DC | PRN

## 2023-03-31 MED ORDER — MAGNESIUM SULFATE 2 GM/50ML IV SOLN: 2.0000 g | Freq: Once | INTRAVENOUS | Status: DC

## 2023-03-31 MED ORDER — OXALIPLATIN CHEMO INJECTION 100 MG/20ML
55.0000 mg/m2 | Freq: Once | INTRAVENOUS | Status: AC
  Administered 2023-03-31: 130 mg via INTRAVENOUS
  Filled 2023-03-31: qty 20

## 2023-03-31 MED ORDER — SODIUM CHLORIDE 0.9 % IV SOLN
4300.0000 mg | INTRAVENOUS | Status: AC
  Administered 2023-03-31: 4300 mg via INTRAVENOUS
  Filled 2023-03-31: qty 86

## 2023-03-31 MED ORDER — DEXTROSE 5 % IV SOLN: Freq: Once | INTRAVENOUS | Status: AC

## 2023-03-31 MED ORDER — PALONOSETRON HCL INJECTION 0.25 MG/5ML
0.2500 mg | Freq: Once | INTRAVENOUS | Status: AC
  Administered 2023-03-31: 0.25 mg via INTRAVENOUS
  Filled 2023-03-31: qty 5

## 2023-03-31 MED ORDER — POTASSIUM CHLORIDE CRYS ER 20 MEQ PO TBCR: 40.0000 meq | EXTENDED_RELEASE_TABLET | ORAL | Status: DC

## 2023-03-31 NOTE — Patient Instructions (Signed)
[facility] [place]  Discharge Instructions: Thank you for choosing [facility] to provide your oncology and hematology care.  If you have a lab appointment with the Cancer Center - please note that after April 8th, 2024, all labs will be drawn in the cancer center.  You do not have to check in or register with the main entrance as you have in the past but will complete your check-in in the cancer center.  Wear comfortable clothing and clothing appropriate for easy access to any Portacath or PICC line.   We strive to give you quality time with your provider. You may need to reschedule your appointment if you arrive late (15 or more minutes).  Arriving late affects you and other patients whose appointments are after yours.  Also, if you miss three or more appointments without notifying the office, you may be dismissed from the clinic at the provider's discretion.      For prescription refill requests, have your pharmacy contact our office and allow 72 hours for refills to be completed.    Today you received the following chemotherapy and/or immunotherapy agents Folfirinox   To help prevent nausea and vomiting after your treatment, we encourage you to take your nausea medication as directed.  BELOW ARE SYMPTOMS THAT SHOULD BE REPORTED IMMEDIATELY: *FEVER GREATER THAN 100.4 F (38 C) OR HIGHER *CHILLS OR SWEATING *NAUSEA AND VOMITING THAT IS NOT CONTROLLED WITH YOUR NAUSEA MEDICATION *UNUSUAL SHORTNESS OF BREATH *UNUSUAL BRUISING OR BLEEDING *URINARY PROBLEMS (pain or burning when urinating, or frequent urination) *BOWEL PROBLEMS (unusual diarrhea, constipation, pain near the anus) TENDERNESS IN MOUTH AND THROAT WITH OR WITHOUT PRESENCE OF ULCERS (sore throat, sores in mouth, or a toothache) UNUSUAL RASH, SWELLING OR PAIN  UNUSUAL VAGINAL DISCHARGE OR ITCHING   Items with * indicate a potential emergency and should be followed up as soon as possible or go to the  Emergency Department if any problems should occur.  Please show the CHEMOTHERAPY ALERT CARD or IMMUNOTHERAPY ALERT CARD at check-in to the Emergency Department and triage nurse.  Should you have questions after your visit or need to cancel or reschedule your appointment, please contact [facility] [phone]  and follow the prompts.  Office hours are 8:00 a.m. to 4:30 p.m. Monday - Friday. Please note that voicemails left after 4:00 p.m. may not be returned until the following business day.  We are closed weekends and major holidays. You have access to a nurse at all times for urgent questions. Please call the main number to the clinic [phone] and follow the prompts.  For any non-urgent questions, you may also contact your provider using MyChart. We now offer e-Visits for anyone 18 and older to request care online for non-urgent symptoms. For details visit [email or web address].   Also download the MyChart app! Go to the app store, search "MyChart", open the app, select [facility], and log in with your MyChart username and password.   

## 2023-03-31 NOTE — Addendum Note (Signed)
Addended by: Pryor Ochoa E on: 03/31/2023 03:50 PM   Modules accepted: Orders

## 2023-03-31 NOTE — Addendum Note (Signed)
Addended by: Lawson Fiscal C on: 03/31/2023 10:30 AM   Modules accepted: Orders

## 2023-03-31 NOTE — Progress Notes (Signed)
Patient has been examined by Dr. Ellin Saba. Vital signs and labs have been reviewed by MD - ANC, Creatinine, LFTs, hemoglobin, and platelets are within treatment parameters per M.D. - pt may proceed with treatment. Magnesium and potassium to be given per standing orders per MD.  Primary RN and pharmacy notified.

## 2023-03-31 NOTE — Progress Notes (Signed)
Adjust 5FU pump dose due to weight loss.  1920 mg/m2 = 4300 mg  All other chemotherapy doses within 10% change rule  T.O. Dr Carilyn Goodpasture, PharmD

## 2023-03-31 NOTE — Patient Instructions (Signed)
[facility] at [facility] Discharge Instructions   You were seen and examined today by Dr. Ellin Saba.  He reviewed the results of your lab work which are mostly normal/stable. Your potassium and magnesium are critically low today. We will correct these in the clinic today with IV potassium and magnesium.   We will proceed with your treatment today.   Return as scheduled.    Thank you for choosing [facility] at [facility] to provide your oncology and hematology care.  To afford each patient quality time with our provider, please arrive at least 15 minutes before your scheduled appointment time.   If you have a lab appointment with the Cancer Center please come in thru the Main Entrance and check in at the main information desk.  You need to re-schedule your appointment should you arrive 10 or more minutes late.  We strive to give you quality time with our providers, and arriving late affects you and other patients whose appointments are after yours.  Also, if you no show three or more times for appointments you may be dismissed from the clinic at the providers discretion.     Again, thank you for choosing [facility].  Our hope is that these requests will decrease the amount of time that you wait before being seen by our physicians.       _____________________________________________________________  Should you have questions after your visit to [facility], please contact our office at [phone] and follow the prompts.  Our office hours are 8:00 a.m. and 4:30 p.m. Monday - Friday.  Please note that voicemails left after 4:00 p.m. may not be returned until the following business day.  We are closed weekends and major holidays.  You do have access to a nurse 24-7, just call the main number to the clinic [phone] and do not press any options, hold on the line and a nurse will answer the phone.    For prescription  refill requests, have your pharmacy contact our office and allow 72 hours.    Due to Covid, you will need to wear a mask upon entering the hospital. If you do not have a mask, a mask will be given to you at the Main Entrance upon arrival. For doctor visits, patients may have 1 support person age 63 or older with them. For treatment visits, patients can not have anyone with them due to social distancing guidelines and our immunocompromised population.

## 2023-03-31 NOTE — Progress Notes (Addendum)
Patient presents today for Folfirinox with 5FU pump start.  Labs and vital signs reviewed by MD.    Message received from Chapman Moss RN/Dr. Ellin Saba patient okay for treatment and will receive Magnesium 4 grams IV, Potassium PO, IV, and another PO.  Treatment given today per MD orders.  Stable during infusion without adverse affects.  Vital signs stable.  Verified RUN on the screen with the patient.  No complaints at this time.  Discharge from clinic ambulatory in stable condition.  Alert and oriented X 3.  Follow up with [facility] as scheduled.

## 2023-03-31 NOTE — Addendum Note (Signed)
Addended by: Pryor Ochoa E on: 03/31/2023 10:16 AM   Modules accepted: Orders

## 2023-03-31 NOTE — Addendum Note (Signed)
Addended by: Lala Lund on: 03/31/2023 09:40 AM   Modules accepted: Orders

## 2023-03-31 NOTE — Progress Notes (Signed)
CRITICAL VALUE ALERT Critical value received:  K+2.5 Date of notification:  03-31-23 Time of notification: 0923 Critical value read back:  Yes.   Nurse who received alert:  C. Maridee Slape RN MD notified time and response:  [phone], Dr. Ellin Saba, will give potassium per standing orders per MD

## 2023-04-01 ENCOUNTER — Other Ambulatory Visit: Payer: Self-pay

## 2023-04-01 DIAGNOSIS — C20 Malignant neoplasm of rectum: Secondary | ICD-10-CM

## 2023-04-02 ENCOUNTER — Inpatient Hospital Stay: Payer: Medicare HMO

## 2023-04-02 VITALS — BP 120/83 | HR 86 | Temp 97.0°F | Resp 18

## 2023-04-02 DIAGNOSIS — C20 Malignant neoplasm of rectum: Secondary | ICD-10-CM

## 2023-04-02 DIAGNOSIS — D509 Iron deficiency anemia, unspecified: Secondary | ICD-10-CM

## 2023-04-02 DIAGNOSIS — Z5111 Encounter for antineoplastic chemotherapy: Secondary | ICD-10-CM | POA: Diagnosis not present

## 2023-04-02 MED ORDER — SODIUM CHLORIDE 0.9% FLUSH
10.0000 mL | INTRAVENOUS | Status: DC | PRN
  Administered 2023-04-02: 10 mL

## 2023-04-02 MED ORDER — MAGNESIUM SULFATE 2 GM/50ML IV SOLN
2.0000 g | Freq: Once | INTRAVENOUS | Status: AC
  Administered 2023-04-02: 2 g via INTRAVENOUS
  Filled 2023-04-02: qty 50

## 2023-04-02 MED ORDER — HEPARIN SOD (PORK) LOCK FLUSH 100 UNIT/ML IV SOLN
500.0000 [IU] | Freq: Once | INTRAVENOUS | Status: AC | PRN
  Administered 2023-04-02: 500 [IU]

## 2023-04-02 MED ORDER — PEGFILGRASTIM-CBQV 6 MG/0.6ML ~~LOC~~ SOSY
6.0000 mg | PREFILLED_SYRINGE | Freq: Once | SUBCUTANEOUS | Status: AC
  Administered 2023-04-02: 6 mg via SUBCUTANEOUS
  Filled 2023-04-02: qty 0.6

## 2023-04-02 MED ORDER — POTASSIUM CHLORIDE IN NACL 20-0.9 MEQ/L-% IV SOLN
Freq: Once | INTRAVENOUS | Status: AC
  Filled 2023-04-02: qty 1000

## 2023-04-02 NOTE — Progress Notes (Signed)
Hydration given today per MD orders. Tolerated infusion without adverse affects. Vital signs stable. No complaints at this time. Discharged from clinic by wheel chair in stable condition. Alert and oriented x 3. F/U with [facility] as scheduled.   °

## 2023-04-02 NOTE — Progress Notes (Signed)
Patient presents today for IVF.  Patient is in satisfactory condition with no new complaints voiced.  Vital signs are stable.  We will proceed with IVF per MD orders.

## 2023-04-02 NOTE — Progress Notes (Signed)
[facility] [address], [place] [address] Phone: [phone] Fax: [phone]  SYMPTOM MANAGEMENT CLINIC PROGRESS NOTE   [name] [id number] [birth year] 67 y.o.  [name] is managed by Dr. Ellin Saba for her stage IIc rectal cancer   Actively treated with chemotherapy/immunotherapy/hormonal therapy: YES   Current therapy: FOLFIRINOX   Last treated: Cycle #6 started 03/31/2023 with pump D/C and Udenyca on 04/02/2023  INTERVAL HISTORY:  Chief Complaint: Chemotherapy follow-up & symptom management visit  [name] reports feeling *** after her fifth cycle of chemotherapy.  *** Oral intake/appetite *** Marinol or Megace? *** Taste changes *** Liquids *** Her weight today is *** pounds, which is *** pounds in the past week.  She reports severe fatigue.  *** *** She has ongoing nausea that is somewhat improved after combination of olanzapine, Compazine, and Zofran. *** She had 1 episode of vomiting this week. *** She has had 6-8 episodes of watery diarrhea daily since Saturday. *** She has been taking Imodium as recommended.  (Added Lomotil after last visit 1 tablet 4 times daily, increased by Dr. Kirtland Bouchard  to Lomotil 2 tablets 4 times daily??) *** Her Mounjaro, NovoLog, and metformin have been stopped by endocrinology.  ***She has a stable diabetic peripheral neuropathy.  Cold sensitivity from oxaliplatin has improved.  ***She denies any significant pelvic or rectal pain this week.  She has prescription for hydrocodone available.   ***She denies any major rectal bleeding this week.  She denies any ice pica.  ***Previously reported mouth sore resolved after Carafate/lidocaine mouthwash.  No recurrent mouth sores at this time.   PERTINENT NEGATIVES: No changes in urine, rash/skin changes, peripheral edema, chest pain, shortness of breath, dyspnea. ***  ASSESSMENT & PLAN:  ## STAGE IIC RECTAL CANCER - Primary oncologist is Dr.  Ellin Saba - Patient is on treatment plan FOLFIRINOX q. 14 days x 8 cycles - C6/D1 of FOLFIRINOX on 03/31/2023 with pump D/C on 04/02/2023 - She received Udenyca on 04/02/2023 - PLAN: Next scheduled visit with medical oncologist (Dr. Ellin Saba) on 04/14/2023 for cycle #7 of chemotherapy  # Diarrhea*** - Ongoing diarrhea since cycle #2 - Treated for possible diverticulitis after CT A/P from ED (02/21/2023) showed "sigmoid diverticulosis with mild wall thickening of the sigmoid colon, suggesting mild diverticulitis".  C. difficile and GI pathogen panel were negative. - Currently reports 6-8 soft bowel movements daily ranging from watery to soft. - She is taking Imodium as recommended - She received atropine premedication with cycle #5 of chemotherapy - PLAN: Continue Imodium as recommended (4 mg after first watery bowel movements, with 2 mg after each subsequent watery bowel movement, up to 60 mg daily) - We will add Lomotil every 6 hours  # Nausea and vomiting*** - Refractory chemo-induced nausea/vomiting despite Compazine and Zofran - Improved after starting olanzapine 5 mg nightly  - PLAN: Continue olanzapine 5 mg nightly - Continue Compazine every 6 hours with Zofran 8 mg ODT TID PRN breakthrough nausea/vomiting.  # Dehydration*** # Weight loss and decreased appetite*** - Severely decreased appetite and very poor oral intake. - She is taking Megace 400 mg daily. - Mounjaro, metformin, and NovoLog were discontinued by endocrinology. - Weight today is 249 pounds, which is down 4 pounds in the past week.  - PLAN: 1 L NS given in clinic today - Continue close follow-up with nutritionist - We will try Marinol 5 mg twice daily instead of Megace to see if she has some improvement  on this.***Not able to get this filled due to nationwide shortage*** - Scheduled for IV fluids again on Thursday this week  # Hypokalemia*** # Hypomagnesemia*** - She is taking magnesium 400 mg twice daily and  potassium 40 mEq twice daily - Continues to have hypokalemia and hypomagnesemia despite oral repletion, likely secondary to GI losses - PLAN: Continue magnesium 400 mg twice daily + potassium 40 mill equivalents twice daily - IV repletion today with 4 g IV magnesium + 40 mEq IV potassium - Recheck labs and give possible IV electrolytes on Thursday this week  # Depression*** - PLAN: Continue fluoxetine 20 mg  # Chemotherapy-induced pancytopenia*** - CBC today (03/22/2023): Hgb 8.2, platelets 124, WBC 16.7  - Patient does also have underlying liver cirrhosis, but has previously had normal WBCs and platelets. 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   - She has received IV Venofer 400 mg x 3 (most recently on 02/23/2023)  # Diabetic peripheral neuropathy*** - She has no feeling in her toes.  No worsening since oxaliplatin started, but reports onset of cold sensitivity after second cycle of chemotherapy.   - PLAN: Continue Lyrica twice daily.  *** Not taking Lyrica??  ***  # Pelvic pain*** -  Intermittent pelvic pain, none today. - She has prescription for hydrocodone 5 mg every 4-6 hours as needed for severe pain.  # Folic acid deficiency*** - PLAN: Continue folic acid 1 mg tablet daily  PLAN SUMMARY: >> Schedule for labs (BMP, magnesium, CBC) + possible IV fluids/electrolytes on Thursday, 03/25/2023*** >> Next scheduled appointment with medical oncologist: 04/14/2023 (cycle #7 chemotherapy)    REVIEW OF SYSTEMS:***  Review of Systems  Constitutional:  Positive for activity change, appetite change, fatigue and unexpected weight change. Negative for chills, diaphoresis and fever.  HENT:  Positive for trouble swallowing (pills). Negative for mouth sores, nosebleeds and sore  throat.   Respiratory:  Negative for cough and shortness of breath.   Cardiovascular:  Negative for chest pain, palpitations and leg swelling.  Gastrointestinal:  Positive for diarrhea, nausea and vomiting. Negative for abdominal pain, blood in stool and constipation.  Genitourinary:  Negative for dysuria and hematuria.  Neurological:  Negative for dizziness, light-headedness, numbness and headaches.  Psychiatric/Behavioral:  Positive for dysphoric mood and sleep disturbance. The patient is nervous/anxious.     Past Medical History, Surgical history, Social history, and Family history were reviewed as documented elsewhere in chart, and were updated as appropriate.   OBJECTIVE:***  Physical Exam:  There were no vitals taken for this visit. ECOG: 3  Physical Exam Constitutional:      Appearance: Normal appearance. She is morbidly obese.     Comments: Somewhat weak appearing on exam  Cardiovascular:     Rate and Rhythm: Tachycardia present.     Heart sounds: Murmur heard.  Pulmonary:     Breath sounds: Normal breath sounds.  Musculoskeletal:     Right lower leg: Edema (1+) present.     Left lower leg: Edema (1+) present.  Skin:    General: Skin is warm and dry.     Coloration: Skin is pale.     Comments: Poor turgor  Neurological:     General: No focal deficit present.     Mental Status: Mental status is at baseline.  Psychiatric:  Behavior: Behavior normal. Behavior is cooperative.    Lab Review:     Component Value Date/Time   NA 137 03/31/2023 0839   NA 139 09/03/2022 1305   K 2.5 (LL) 03/31/2023 0839   CL 104 03/31/2023 0839   CO2 22 03/31/2023 0839   GLUCOSE 139 (H) 03/31/2023 0839   BUN 6 (L) 03/31/2023 0839   BUN 8 09/03/2022 1305   CREATININE 0.82 03/31/2023 0839   CREATININE 0.84 01/26/2023 1503   CALCIUM 8.2 (L) 03/31/2023 0839   PROT 5.8 (L) 03/31/2023 0839   PROT 6.6 09/03/2022 1305   ALBUMIN 2.8 (L) 03/31/2023 0839   ALBUMIN 3.7 (L) 09/03/2022  1305   AST 22 03/31/2023 0839   ALT 13 03/31/2023 0839   ALKPHOS 101 03/31/2023 0839   BILITOT 0.9 03/31/2023 0839   BILITOT 0.3 09/03/2022 1305   GFRNONAA >60 03/31/2023 0839   GFRNONAA 78 12/14/2019 0848   GFRAA 81 12/11/2020 0908   GFRAA 90 12/14/2019 0848       Component Value Date/Time   WBC 6.6 03/31/2023 0839   RBC 3.86 (L) 03/31/2023 0839   HGB 11.4 (L) 03/31/2023 0839   HGB 8.6 (L) 11/26/2022 1343   HCT 34.9 (L) 03/31/2023 0839   HCT 30.5 (L) 11/26/2022 1343   PLT 136 (L) 03/31/2023 0839   PLT 199 11/26/2022 1343   MCV 90.4 03/31/2023 0839   MCV 76 (L) 11/26/2022 1343   MCH 29.5 03/31/2023 0839   MCHC 32.7 03/31/2023 0839   RDW 24.0 (H) 03/31/2023 0839   RDW 16.6 (H) 11/26/2022 1343   LYMPHSABS 1.4 03/31/2023 0839   MONOABS 0.8 03/31/2023 0839   EOSABS 0.1 03/31/2023 0839   BASOSABS 0.0 03/31/2023 0839   -------------------------------  Imaging from last 24 hours (if applicable): Radiology interpretation: No results found.    WRAP UP:  All questions were answered. The patient knows to call the clinic with any problems, questions or concerns.  Medical decision making: High***  Time spent on visit: I spent *** minutes counseling the patient face to face. The total time spent in the appointment was *** minutes and more than 50% was on counseling.  Carnella Guadalajara, PA-C  ***

## 2023-04-02 NOTE — Patient Instructions (Signed)
[facility] [place]  Discharge Instructions: Thank you for choosing [facility] to provide your oncology and hematology care.  If you have a lab appointment with the Cancer Center - please note that after April 8th, 2024, all labs will be drawn in the cancer center.  You do not have to check in or register with the main entrance as you have in the past but will complete your check-in in the cancer center.  Wear comfortable clothing and clothing appropriate for easy access to any Portacath or PICC line.   We strive to give you quality time with your provider. You may need to reschedule your appointment if you arrive late (15 or more minutes).  Arriving late affects you and other patients whose appointments are after yours.  Also, if you miss three or more appointments without notifying the office, you may be dismissed from the clinic at the provider's discretion.      For prescription refill requests, have your pharmacy contact our office and allow 72 hours for refills to be completed.    Today you received the following chemotherapy and/or immunotherapy agents hydration.       To help prevent nausea and vomiting after your treatment, we encourage you to take your nausea medication as directed.  BELOW ARE SYMPTOMS THAT SHOULD BE REPORTED IMMEDIATELY: *FEVER GREATER THAN 100.4 F (38 C) OR HIGHER *CHILLS OR SWEATING *NAUSEA AND VOMITING THAT IS NOT CONTROLLED WITH YOUR NAUSEA MEDICATION *UNUSUAL SHORTNESS OF BREATH *UNUSUAL BRUISING OR BLEEDING *URINARY PROBLEMS (pain or burning when urinating, or frequent urination) *BOWEL PROBLEMS (unusual diarrhea, constipation, pain near the anus) TENDERNESS IN MOUTH AND THROAT WITH OR WITHOUT PRESENCE OF ULCERS (sore throat, sores in mouth, or a toothache) UNUSUAL RASH, SWELLING OR PAIN  UNUSUAL VAGINAL DISCHARGE OR ITCHING   Items with * indicate a potential emergency and should be followed up as soon as possible or go to  the Emergency Department if any problems should occur.  Please show the CHEMOTHERAPY ALERT CARD or IMMUNOTHERAPY ALERT CARD at check-in to the Emergency Department and triage nurse.  Should you have questions after your visit or need to cancel or reschedule your appointment, please contact [facility] [place] [phone]  and follow the prompts.  Office hours are 8:00 a.m. to 4:30 p.m. Monday - Friday. Please note that voicemails left after 4:00 p.m. may not be returned until the following business day.  We are closed weekends and major holidays. You have access to a nurse at all times for urgent questions. Please call the main number to the clinic [phone] and follow the prompts.  For any non-urgent questions, you may also contact your provider using MyChart. We now offer e-Visits for anyone 18 and older to request care online for non-urgent symptoms. For details visit [email or web address].   Also download the MyChart app! Go to the app store, search "MyChart", open the app, select [facility], and log in with your MyChart username and password.   

## 2023-04-05 ENCOUNTER — Other Ambulatory Visit: Payer: Self-pay

## 2023-04-05 ENCOUNTER — Inpatient Hospital Stay: Payer: Medicare HMO

## 2023-04-05 ENCOUNTER — Inpatient Hospital Stay (HOSPITAL_BASED_OUTPATIENT_CLINIC_OR_DEPARTMENT_OTHER): Payer: Medicare HMO | Admitting: Physician Assistant

## 2023-04-05 VITALS — BP 103/82 | HR 109 | Temp 97.8°F | Resp 20

## 2023-04-05 VITALS — Wt 236.3 lb

## 2023-04-05 DIAGNOSIS — R64 Cachexia: Secondary | ICD-10-CM

## 2023-04-05 DIAGNOSIS — R112 Nausea with vomiting, unspecified: Secondary | ICD-10-CM

## 2023-04-05 DIAGNOSIS — R531 Weakness: Secondary | ICD-10-CM

## 2023-04-05 DIAGNOSIS — T451X5A Adverse effect of antineoplastic and immunosuppressive drugs, initial encounter: Secondary | ICD-10-CM

## 2023-04-05 DIAGNOSIS — C20 Malignant neoplasm of rectum: Secondary | ICD-10-CM | POA: Diagnosis not present

## 2023-04-05 DIAGNOSIS — E876 Hypokalemia: Secondary | ICD-10-CM

## 2023-04-05 DIAGNOSIS — Z95828 Presence of other vascular implants and grafts: Secondary | ICD-10-CM

## 2023-04-05 DIAGNOSIS — Z5111 Encounter for antineoplastic chemotherapy: Secondary | ICD-10-CM | POA: Diagnosis not present

## 2023-04-05 DIAGNOSIS — E860 Dehydration: Secondary | ICD-10-CM

## 2023-04-05 DIAGNOSIS — R197 Diarrhea, unspecified: Secondary | ICD-10-CM

## 2023-04-05 LAB — CBC WITH DIFFERENTIAL/PLATELET
Abs Immature Granulocytes: 0 10*3/uL (ref 0.00–0.07)
Basophils Absolute: 0 10*3/uL (ref 0.0–0.1)
Basophils Relative: 0 %
Eosinophils Absolute: 0.2 10*3/uL (ref 0.0–0.5)
Eosinophils Relative: 1 %
HCT: 31.4 % — ABNORMAL LOW (ref 36.0–46.0)
Hemoglobin: 10.4 g/dL — ABNORMAL LOW (ref 12.0–15.0)
Lymphocytes Relative: 2 %
Lymphs Abs: 0.5 10*3/uL — ABNORMAL LOW (ref 0.7–4.0)
MCH: 30.1 pg (ref 26.0–34.0)
MCHC: 33.1 g/dL (ref 30.0–36.0)
MCV: 90.8 fL (ref 80.0–100.0)
Monocytes Absolute: 0 10*3/uL — ABNORMAL LOW (ref 0.1–1.0)
Monocytes Relative: 0 %
Neutro Abs: 22.4 10*3/uL — ABNORMAL HIGH (ref 1.7–7.7)
Neutrophils Relative %: 97 %
Platelets: 156 10*3/uL (ref 150–400)
RBC: 3.46 MIL/uL — ABNORMAL LOW (ref 3.87–5.11)
RDW: 24.1 % — ABNORMAL HIGH (ref 11.5–15.5)
WBC: 23.1 10*3/uL — ABNORMAL HIGH (ref 4.0–10.5)
nRBC: 0 % (ref 0.0–0.2)

## 2023-04-05 LAB — COMPREHENSIVE METABOLIC PANEL
ALT: 16 U/L (ref 0–44)
AST: 35 U/L (ref 15–41)
Albumin: 2.8 g/dL — ABNORMAL LOW (ref 3.5–5.0)
Alkaline Phosphatase: 135 U/L — ABNORMAL HIGH (ref 38–126)
Anion gap: 9 (ref 5–15)
BUN: 10 mg/dL (ref 8–23)
CO2: 23 mmol/L (ref 22–32)
Calcium: 8.2 mg/dL — ABNORMAL LOW (ref 8.9–10.3)
Chloride: 105 mmol/L (ref 98–111)
Creatinine, Ser: 0.77 mg/dL (ref 0.44–1.00)
GFR, Estimated: >60 mL/min (ref >60)
Glucose, Bld: 185 mg/dL — ABNORMAL HIGH (ref 70–99)
Potassium: 2.2 mmol/L — CL (ref 3.5–5.1)
Sodium: 137 mmol/L (ref 135–145)
Total Bilirubin: 1 mg/dL (ref 0.3–1.2)
Total Protein: 5.8 g/dL — ABNORMAL LOW (ref 6.5–8.1)

## 2023-04-05 LAB — SAMPLE TO BLOOD BANK

## 2023-04-05 LAB — MAGNESIUM: Magnesium: 1.8 mg/dL (ref 1.7–2.4)

## 2023-04-05 LAB — PHOSPHORUS: Phosphorus: 2.7 mg/dL (ref 2.5–4.6)

## 2023-04-05 MED ORDER — SODIUM CHLORIDE 0.9% FLUSH
10.0000 mL | Freq: Once | INTRAVENOUS | Status: AC
  Administered 2023-04-05: 10 mL via INTRAVENOUS

## 2023-04-05 MED ORDER — POTASSIUM CHLORIDE IN NACL 40-0.9 MEQ/L-% IV SOLN
Freq: Once | INTRAVENOUS | Status: AC
  Filled 2023-04-05: qty 1000

## 2023-04-05 MED ORDER — HEPARIN SOD (PORK) LOCK FLUSH 100 UNIT/ML IV SOLN
500.0000 [IU] | Freq: Once | INTRAVENOUS | Status: AC
  Administered 2023-04-05: 500 [IU] via INTRAVENOUS

## 2023-04-05 MED ORDER — ONDANSETRON HCL 4 MG/2ML IJ SOLN
8.0000 mg | Freq: Once | INTRAMUSCULAR | Status: AC
  Administered 2023-04-05: 8 mg via INTRAVENOUS
  Filled 2023-04-05: qty 4

## 2023-04-05 MED ORDER — POTASSIUM CHLORIDE 40 MEQ/15ML (20%) PO SOLN
15.0000 mL | Freq: Three times a day (TID) | ORAL | 2 refills | Status: DC
Start: 2023-04-05 — End: 2023-04-13

## 2023-04-05 MED ORDER — POTASSIUM CHLORIDE 10 MEQ/100ML IV SOLN: 10.0000 meq | INTRAVENOUS | Status: AC

## 2023-04-05 MED ORDER — SODIUM CHLORIDE 0.9 % IV SOLN: Freq: Once | INTRAVENOUS | Status: AC

## 2023-04-05 MED ORDER — OLANZAPINE 5 MG PO TABS: 10.0000 mg | ORAL_TABLET | Freq: Every day | ORAL | 2 refills | Status: DC

## 2023-04-05 NOTE — Patient Instructions (Signed)
[facility] [place]  Discharge Instructions: Thank you for choosing [facility] to provide your oncology and hematology care.  If you have a lab appointment with the Cancer Center - please note that after April 8th, 2024, all labs will be drawn in the cancer center.  You do not have to check in or register with the main entrance as you have in the past but will complete your check-in in the cancer center.  Wear comfortable clothing and clothing appropriate for easy access to any Portacath or PICC line.   We strive to give you quality time with your provider. You may need to reschedule your appointment if you arrive late (15 or more minutes).  Arriving late affects you and other patients whose appointments are after yours.  Also, if you miss three or more appointments without notifying the office, you may be dismissed from the clinic at the provider's discretion.      For prescription refill requests, have your pharmacy contact our office and allow 72 hours for refills to be completed.  To help prevent nausea and vomiting after your treatment, we encourage you to take your nausea medication as directed.  BELOW ARE SYMPTOMS THAT SHOULD BE REPORTED IMMEDIATELY: *FEVER GREATER THAN 100.4 F (38 C) OR HIGHER *CHILLS OR SWEATING *NAUSEA AND VOMITING THAT IS NOT CONTROLLED WITH YOUR NAUSEA MEDICATION *UNUSUAL SHORTNESS OF BREATH *UNUSUAL BRUISING OR BLEEDING *URINARY PROBLEMS (pain or burning when urinating, or frequent urination) *BOWEL PROBLEMS (unusual diarrhea, constipation, pain near the anus) TENDERNESS IN MOUTH AND THROAT WITH OR WITHOUT PRESENCE OF ULCERS (sore throat, sores in mouth, or a toothache) UNUSUAL RASH, SWELLING OR PAIN  UNUSUAL VAGINAL DISCHARGE OR ITCHING   Items with * indicate a potential emergency and should be followed up as soon as possible or go to the Emergency Department if any problems should occur.  Please show the CHEMOTHERAPY ALERT CARD or  IMMUNOTHERAPY ALERT CARD at check-in to the Emergency Department and triage nurse.  Should you have questions after your visit or need to cancel or reschedule your appointment, please contact [facility] [phone]  and follow the prompts.  Office hours are 8:00 a.m. to 4:30 p.m. Monday - Friday. Please note that voicemails left after 4:00 p.m. may not be returned until the following business day.  We are closed weekends and major holidays. You have access to a nurse at all times for urgent questions. Please call the main number to the clinic [phone] and follow the prompts.  For any non-urgent questions, you may also contact your provider using MyChart. We now offer e-Visits for anyone 18 and older to request care online for non-urgent symptoms. For details visit [email or web address].   Also download the MyChart app! Go to the app store, search "MyChart", open the app, select [facility], and log in with your MyChart username and password.   

## 2023-04-05 NOTE — Progress Notes (Signed)
Patient presents today for labs and possible IVF.  Patient is not feeling well today.  She c/o weakness, dizziness, and decreased appetite.  Rojelio Brenner, PA-C made aware.  Vital signs are stable.    We will start NS 1 L over 2 hours and will adjust once we get labs back per Rojelio Brenner, PA-C.    Potassium today is 2.2.  All other labs are WNL.  We will give Potassium 40 mEq in 1 L of NS mixed from pharmacy per PA-C.   NS stopped and Potassium/NS started.  This was ok with Rojelio Brenner, PA-C as patient is not in fluid overload.  Patient will also receive Zofran 8 mg IV x one dose per PA-C for nausea.  Patient tolerated IVF well with no complaints voiced.  Patient left via wheelchair with daughter in stable condition.  Vital signs stable at discharge.  Follow up as scheduled.

## 2023-04-05 NOTE — Progress Notes (Signed)
CRITICAL VALUE STICKER  CRITICAL VALUE: K+2.2  RECEIVER (on-site recipient of call):  Cynda Acres, RN  DATE & TIME NOTIFIED: 04/05/23 @ 1030 AM  MD NOTIFIED: Rojelio Brenner, PAC   TIME OF NOTIFICATION: 10:30 AM  RESPONSE:  Give 40 meq IV potassium

## 2023-04-05 NOTE — Telephone Encounter (Signed)
Referral to [facility] made per provider.

## 2023-04-05 NOTE — Patient Instructions (Addendum)
[facility] at [facility] **VISIT SUMMARY & IMPORTANT INSTRUCTIONS **   You were seen today by Rojelio Brenner PA-C for your chemotherapy follow-up and symptom management visit.    NAUSEA & VOMITING Take olanzapine 10 mg (INCREASED DOSE) every night at bedtime Take Compazine 10 mg every 6 hours.   Take Zofran (ondansetron) 4 mg orally dissolving tablets.  Take TWO tablets (8 mg total) every 8 hours in between Compazine doses as needed for breakthrough nausea/vomiting.    DIARRHEA Continue taking Lomotil (2 tablets) four times daily for diarrhea. Take Imodium as needed if you continue to have diarrhea despite Lomotil. Take Imodium 2 tablets after your first loose bowel movement of the day.   Take an additional tablet of Imodium after every subsequent loose bowel movement.   Do not take more than 8 tablets (16 mg total) of Imodium in 1 day.   DEHYDRATION & LOW ELECTROLYTES:  Try to drink at least 60 ounces of water daily. We will check labs and give additional IV fluids if needed on Thursday (03/25/23) Continue magnesium oxide 400 mg twice daily. Continue 40 mEq potassium (15 mL liquid) THREE TIMES daily (INCREASED DOSE)  WEIGHT LOSS Try to drink 3 Ensure daily. Try to eat small, frequent high-calorie meals. Follow recommendations as given by nutritionist. We have increased your olanzapine to 10 mg nightly to see if this will improve your appetite.  (This may also help you to sleep better at night) Increased dose of olanzapine can also cause your blood sugars to be higher. Make sure you are checking your glucose 4 times each day (before meals and at bedtime). If your glucose is higher than 250 for three tests in a row, please reach out to your endocrinologist (NP Ronny Bacon)  WEAKNESS: We have referred you for home health physical therapy.  Try to be as active as possible while remaining safe.  ** If you have any new or worsening symptoms throughout the  duration of your cancer treatment, please do not hesitate to call the Cancer Center at [phone].  Our triage nurse can assist with many common problems, and same-day symptom management visits are available with Rojelio Brenner PA-C as needed.  FOLLOW-UP APPOINTMENT: - Repeat labs with possible IV fluids or blood on Wednesday (5/15) and Friday (5/17) - Follow-up visit with Dr. Ellin Saba on 04/14/2023  ** Thank you for trusting me with your healthcare!  I strive to provide all of my patients with quality care at each visit.  If you receive a survey for this visit, I would be so grateful to you for taking the time to provide feedback.  Thank you in advance!  ~ Nithya Meriweather                   Dr. [name]   &   Rojelio Brenner, PA-C   - - - - - - - - - - - - - - - - - -    Thank you for choosing [facility] at [facility] to provide your oncology and hematology care.  To afford each patient quality time with our provider, please arrive at least 15 minutes before your scheduled appointment time.   If you have a lab appointment with the Cancer Center please come in thru the Main Entrance and check in at the main information desk.  You need to re-schedule your appointment should you arrive 10 or more minutes late.  We strive to give  you quality time with our providers, and arriving late affects you and other patients whose appointments are after yours.  Also, if you no show three or more times for appointments you may be dismissed from the clinic at the providers discretion.     Again, thank you for choosing [facility].  Our hope is that these requests will decrease the amount of time that you wait before being seen by our physicians.       _____________________________________________________________  Should you have questions after your visit to [facility], please contact our office at [phone] and follow the prompts.  Our office  hours are 8:00 a.m. and 4:30 p.m. Monday - Friday.  Please note that voicemails left after 4:00 p.m. may not be returned until the following business day.  We are closed weekends and major holidays.  You do have access to a nurse 24-7, just call the main number to the clinic [phone] and do not press any options, hold on the line and a nurse will answer the phone.    For prescription refill requests, have your pharmacy contact our office and allow 72 hours.

## 2023-04-06 ENCOUNTER — Other Ambulatory Visit: Payer: Self-pay

## 2023-04-06 ENCOUNTER — Other Ambulatory Visit: Payer: Self-pay | Admitting: *Deleted

## 2023-04-06 DIAGNOSIS — C20 Malignant neoplasm of rectum: Secondary | ICD-10-CM

## 2023-04-06 MED ORDER — LOPERAMIDE HCL 2 MG PO CAPS: ORAL_CAPSULE | ORAL | 11 refills | Status: DC

## 2023-04-07 ENCOUNTER — Inpatient Hospital Stay: Payer: Medicare HMO

## 2023-04-07 ENCOUNTER — Other Ambulatory Visit: Payer: Self-pay | Admitting: *Deleted

## 2023-04-07 VITALS — BP 108/79 | HR 92 | Temp 96.9°F | Resp 18

## 2023-04-07 VITALS — BP 109/87 | HR 102 | Temp 97.3°F | Resp 18

## 2023-04-07 DIAGNOSIS — C20 Malignant neoplasm of rectum: Secondary | ICD-10-CM

## 2023-04-07 DIAGNOSIS — Z5111 Encounter for antineoplastic chemotherapy: Secondary | ICD-10-CM | POA: Diagnosis not present

## 2023-04-07 DIAGNOSIS — Z95828 Presence of other vascular implants and grafts: Secondary | ICD-10-CM

## 2023-04-07 DIAGNOSIS — E876 Hypokalemia: Secondary | ICD-10-CM

## 2023-04-07 LAB — CBC WITH DIFFERENTIAL/PLATELET
Abs Immature Granulocytes: 0 10*3/uL (ref 0.00–0.07)
Basophils Absolute: 0 10*3/uL (ref 0.0–0.1)
Basophils Relative: 1 %
Eosinophils Absolute: 0 10*3/uL (ref 0.0–0.5)
Eosinophils Relative: 0 %
HCT: 27.1 % — ABNORMAL LOW (ref 36.0–46.0)
Hemoglobin: 9 g/dL — ABNORMAL LOW (ref 12.0–15.0)
Lymphocytes Relative: 19 %
Lymphs Abs: 0.7 10*3/uL (ref 0.7–4.0)
MCH: 30 pg (ref 26.0–34.0)
MCHC: 33.2 g/dL (ref 30.0–36.0)
MCV: 90.3 fL (ref 80.0–100.0)
Monocytes Absolute: 0 10*3/uL — ABNORMAL LOW (ref 0.1–1.0)
Monocytes Relative: 0 %
Neutro Abs: 2.8 10*3/uL (ref 1.7–7.7)
Neutrophils Relative %: 80 %
Platelets: 104 10*3/uL — ABNORMAL LOW (ref 150–400)
RBC: 3 MIL/uL — ABNORMAL LOW (ref 3.87–5.11)
RDW: 23.2 % — ABNORMAL HIGH (ref 11.5–15.5)
WBC: 3.5 10*3/uL — ABNORMAL LOW (ref 4.0–10.5)
nRBC: 0 % (ref 0.0–0.2)

## 2023-04-07 LAB — COMPREHENSIVE METABOLIC PANEL
ALT: 18 U/L (ref 0–44)
AST: 37 U/L (ref 15–41)
Albumin: 2.8 g/dL — ABNORMAL LOW (ref 3.5–5.0)
Alkaline Phosphatase: 118 U/L (ref 38–126)
Anion gap: 8 (ref 5–15)
BUN: 6 mg/dL — ABNORMAL LOW (ref 8–23)
CO2: 22 mmol/L (ref 22–32)
Calcium: 8.2 mg/dL — ABNORMAL LOW (ref 8.9–10.3)
Chloride: 105 mmol/L (ref 98–111)
Creatinine, Ser: 0.74 mg/dL (ref 0.44–1.00)
GFR, Estimated: >60 mL/min (ref >60)
Glucose, Bld: 167 mg/dL — ABNORMAL HIGH (ref 70–99)
Potassium: 2.4 mmol/L — CL (ref 3.5–5.1)
Sodium: 135 mmol/L (ref 135–145)
Total Bilirubin: 1 mg/dL (ref 0.3–1.2)
Total Protein: 5.7 g/dL — ABNORMAL LOW (ref 6.5–8.1)

## 2023-04-07 LAB — MAGNESIUM: Magnesium: 1.6 mg/dL — ABNORMAL LOW (ref 1.7–2.4)

## 2023-04-07 MED ORDER — HEPARIN SOD (PORK) LOCK FLUSH 100 UNIT/ML IV SOLN
500.0000 [IU] | Freq: Once | INTRAVENOUS | Status: AC
  Administered 2023-04-07: 500 [IU] via INTRAVENOUS

## 2023-04-07 MED ORDER — MAGNESIUM SULFATE 2 GM/50ML IV SOLN
2.0000 g | Freq: Once | INTRAVENOUS | Status: AC
  Administered 2023-04-07: 2 g via INTRAVENOUS
  Filled 2023-04-07: qty 50

## 2023-04-07 MED ORDER — SODIUM CHLORIDE 0.9% FLUSH
10.0000 mL | Freq: Once | INTRAVENOUS | Status: AC
  Administered 2023-04-07: 10 mL via INTRAVENOUS

## 2023-04-07 MED ORDER — POTASSIUM CHLORIDE IN NACL 40-0.9 MEQ/L-% IV SOLN
Freq: Once | INTRAVENOUS | Status: AC
  Filled 2023-04-07: qty 1000

## 2023-04-07 MED ORDER — POTASSIUM CHLORIDE 10 MEQ/100ML IV SOLN
10.0000 meq | INTRAVENOUS | Status: AC
  Administered 2023-04-07 (×2): 10 meq via INTRAVENOUS

## 2023-04-07 MED ORDER — LOPERAMIDE HCL 2 MG PO CAPS: 2.0000 mg | ORAL_CAPSULE | ORAL | 11 refills | Status: DC | PRN

## 2023-04-07 NOTE — Progress Notes (Signed)
Stable during infusion without adverse affects.  Vital signs stable.  No complaints at this time.  Discharge from clinic ambulatory in stable condition.  Alert and oriented X 3.  Follow up with [facility] as scheduled.  

## 2023-04-07 NOTE — Progress Notes (Signed)
CRITICAL VALUE ALERT Critical value received:  potassium 2.4 Date of notification:  04-07-2023 Time of notification: 11:20 am. Critical value read back:  Yes.   Nurse who received alert:  B.Sruti Ayllon RN MD notified time and response:  R.Pennington @ 11:26 am. Patient will receive 60 mEq IV potassium and 2 grams of Magnesium Sulfate.

## 2023-04-07 NOTE — Progress Notes (Signed)
Patient arrives here today for possible IVF. Patient in satisfactory condition with no voiced complaints. Patient VSS. Patient's potassium 2.4 and magnesium was magnesium 1.6. Patient to receive NS with with 2 runs of potassium IV and 2 mg of magnesium per Rojelio Brenner PA.

## 2023-04-08 ENCOUNTER — Other Ambulatory Visit: Payer: Self-pay

## 2023-04-08 DIAGNOSIS — C20 Malignant neoplasm of rectum: Secondary | ICD-10-CM

## 2023-04-09 ENCOUNTER — Other Ambulatory Visit: Payer: Self-pay | Admitting: Nurse Practitioner

## 2023-04-09 ENCOUNTER — Inpatient Hospital Stay: Payer: Medicare HMO

## 2023-04-09 ENCOUNTER — Other Ambulatory Visit: Payer: Self-pay | Admitting: Physician Assistant

## 2023-04-09 VITALS — BP 115/83 | HR 81 | Temp 97.6°F | Resp 18

## 2023-04-09 DIAGNOSIS — C20 Malignant neoplasm of rectum: Secondary | ICD-10-CM

## 2023-04-09 DIAGNOSIS — E876 Hypokalemia: Secondary | ICD-10-CM

## 2023-04-09 DIAGNOSIS — Z95828 Presence of other vascular implants and grafts: Secondary | ICD-10-CM

## 2023-04-09 DIAGNOSIS — D62 Acute posthemorrhagic anemia: Secondary | ICD-10-CM

## 2023-04-09 DIAGNOSIS — Z5111 Encounter for antineoplastic chemotherapy: Secondary | ICD-10-CM | POA: Diagnosis not present

## 2023-04-09 DIAGNOSIS — T451X5A Adverse effect of antineoplastic and immunosuppressive drugs, initial encounter: Secondary | ICD-10-CM

## 2023-04-09 LAB — COMPREHENSIVE METABOLIC PANEL
ALT: 17 U/L (ref 0–44)
AST: 36 U/L (ref 15–41)
Albumin: 2.9 g/dL — ABNORMAL LOW (ref 3.5–5.0)
Alkaline Phosphatase: 112 U/L (ref 38–126)
Anion gap: 10 (ref 5–15)
BUN: <5 mg/dL — ABNORMAL LOW (ref 8–23)
CO2: 20 mmol/L — ABNORMAL LOW (ref 22–32)
Calcium: 8.3 mg/dL — ABNORMAL LOW (ref 8.9–10.3)
Chloride: 105 mmol/L (ref 98–111)
Creatinine, Ser: 0.68 mg/dL (ref 0.44–1.00)
GFR, Estimated: >60 mL/min (ref >60)
Glucose, Bld: 170 mg/dL — ABNORMAL HIGH (ref 70–99)
Potassium: 2.9 mmol/L — ABNORMAL LOW (ref 3.5–5.1)
Sodium: 135 mmol/L (ref 135–145)
Total Bilirubin: 1 mg/dL (ref 0.3–1.2)
Total Protein: 5.8 g/dL — ABNORMAL LOW (ref 6.5–8.1)

## 2023-04-09 LAB — CBC WITH DIFFERENTIAL/PLATELET
Abs Immature Granulocytes: 0 10*3/uL (ref 0.00–0.07)
Band Neutrophils: 4 %
Basophils Absolute: 0 10*3/uL (ref 0.0–0.1)
Basophils Relative: 1 %
Eosinophils Absolute: 0 10*3/uL (ref 0.0–0.5)
Eosinophils Relative: 0 %
HCT: 26 % — ABNORMAL LOW (ref 36.0–46.0)
Hemoglobin: 8.7 g/dL — ABNORMAL LOW (ref 12.0–15.0)
Lymphocytes Relative: 20 %
Lymphs Abs: 0.7 10*3/uL (ref 0.7–4.0)
MCH: 30 pg (ref 26.0–34.0)
MCHC: 33.5 g/dL (ref 30.0–36.0)
MCV: 89.7 fL (ref 80.0–100.0)
Monocytes Absolute: 0.2 10*3/uL (ref 0.1–1.0)
Monocytes Relative: 5 %
Neutro Abs: 2.5 10*3/uL (ref 1.7–7.7)
Neutrophils Relative %: 70 %
Platelets: 86 10*3/uL — ABNORMAL LOW (ref 150–400)
RBC: 2.9 MIL/uL — ABNORMAL LOW (ref 3.87–5.11)
RDW: 23.1 % — ABNORMAL HIGH (ref 11.5–15.5)
WBC: 3.4 10*3/uL — ABNORMAL LOW (ref 4.0–10.5)
nRBC: 0 % (ref 0.0–0.2)

## 2023-04-09 LAB — IRON AND TIBC
Iron: 89 ug/dL (ref 28–170)
Saturation Ratios: 57 % — ABNORMAL HIGH (ref 10.4–31.8)
TIBC: 157 ug/dL — ABNORMAL LOW (ref 250–450)
UIBC: 68 ug/dL

## 2023-04-09 LAB — SAMPLE TO BLOOD BANK

## 2023-04-09 LAB — FERRITIN: Ferritin: 2274 ng/mL — ABNORMAL HIGH (ref 11–307)

## 2023-04-09 LAB — MAGNESIUM: Magnesium: 1.8 mg/dL (ref 1.7–2.4)

## 2023-04-09 MED ORDER — POTASSIUM CHLORIDE IN NACL 40-0.9 MEQ/L-% IV SOLN
Freq: Once | INTRAVENOUS | Status: AC
  Filled 2023-04-09: qty 1000

## 2023-04-09 MED ORDER — HEPARIN SOD (PORK) LOCK FLUSH 100 UNIT/ML IV SOLN
500.0000 [IU] | Freq: Once | INTRAVENOUS | Status: AC
  Administered 2023-04-09: 500 [IU] via INTRAVENOUS

## 2023-04-09 MED ORDER — SODIUM CHLORIDE 0.9% FLUSH
10.0000 mL | Freq: Once | INTRAVENOUS | Status: AC
  Administered 2023-04-09: 10 mL via INTRAVENOUS

## 2023-04-09 NOTE — Patient Instructions (Signed)
[facility]  Discharge Instructions: Thank you for choosing [facility] to provide your oncology and hematology care.  If you have a lab appointment with the Cancer Center - please note that after April 8th, 2024, all labs will be drawn in the cancer center.  You do not have to check in or register with the main entrance as you have in the past but will complete your check-in in the cancer center.  Wear comfortable clothing and clothing appropriate for easy access to any Portacath or PICC line.   We strive to give you quality time with your provider. You may need to reschedule your appointment if you arrive late (15 or more minutes).  Arriving late affects you and other patients whose appointments are after yours.  Also, if you miss three or more appointments without notifying the office, you may be dismissed from the clinic at the provider's discretion.      For prescription refill requests, have your pharmacy contact our office and allow 72 hours for refills to be completed.  To help prevent nausea and vomiting after your treatment, we encourage you to take your nausea medication as directed.  BELOW ARE SYMPTOMS THAT SHOULD BE REPORTED IMMEDIATELY: *FEVER GREATER THAN 100.4 F (38 C) OR HIGHER *CHILLS OR SWEATING *NAUSEA AND VOMITING THAT IS NOT CONTROLLED WITH YOUR NAUSEA MEDICATION *UNUSUAL SHORTNESS OF BREATH *UNUSUAL BRUISING OR BLEEDING *URINARY PROBLEMS (pain or burning when urinating, or frequent urination) *BOWEL PROBLEMS (unusual diarrhea, constipation, pain near the anus) TENDERNESS IN MOUTH AND THROAT WITH OR WITHOUT PRESENCE OF ULCERS (sore throat, sores in mouth, or a toothache) UNUSUAL RASH, SWELLING OR PAIN  UNUSUAL VAGINAL DISCHARGE OR ITCHING   Items with * indicate a potential emergency and should be followed up as soon as possible or go to the Emergency Department if any problems should occur.  Please show the CHEMOTHERAPY ALERT CARD or  IMMUNOTHERAPY ALERT CARD at check-in to the Emergency Department and triage nurse.  Should you have questions after your visit or need to cancel or reschedule your appointment, please contact [facility] [phone]  and follow the prompts.  Office hours are 8:00 a.m. to 4:30 p.m. Monday - Friday. Please note that voicemails left after 4:00 p.m. may not be returned until the following business day.  We are closed weekends and major holidays. You have access to a nurse at all times for urgent questions. Please call the main number to the clinic [phone] and follow the prompts.  For any non-urgent questions, you may also contact your provider using MyChart. We now offer e-Visits for anyone 18 and older to request care online for non-urgent symptoms. For details visit [email or web address].   Also download the MyChart app! Go to the app store, search "MyChart", open the app, select [facility], and log in with your MyChart username and password.   

## 2023-04-09 NOTE — Progress Notes (Signed)
Patients port flushed without difficulty.  Good blood return noted with no bruising or swelling noted at site.  Stable during access and blood draw.  Patient to remain accessed for possible fluids and blood.

## 2023-04-09 NOTE — Progress Notes (Signed)
Patient presents today for possible IVF.  Patient is in satisfactory condition with no new complaints voiced.  Vital signs are stable.  Labs reviewed.  Potassium today is 2.9 and magnesium is 1.8.  Hemoglobin today is 8.7. We will give 1 L of NS with 40 mEq of Potassium per Rojelio Brenner, PA-C.    Patient tolerated IVF well with no complaints voiced.  Patient left via wheelchair with daughter in stable condition.  Vital signs stable at discharge.  Follow up as scheduled.

## 2023-04-10 ENCOUNTER — Encounter: Payer: Self-pay | Admitting: Hematology

## 2023-04-12 ENCOUNTER — Telehealth: Payer: Self-pay | Admitting: *Deleted

## 2023-04-12 ENCOUNTER — Ambulatory Visit (HOSPITAL_COMMUNITY)
Admission: RE | Admit: 2023-04-12 | Discharge: 2023-04-12 | Disposition: A | Discharge status: Home / Self Care | Payer: Medicare HMO | Source: Ambulatory Visit | Attending: Hematology | Admitting: Hematology

## 2023-04-12 DIAGNOSIS — C20 Malignant neoplasm of rectum: Secondary | ICD-10-CM | POA: Insufficient documentation

## 2023-04-12 NOTE — Telephone Encounter (Signed)
Patient called to advise that she is not feeling well and feels as if she needs fluids and potassium.  Unfortunately there are no chair spots for today or tomorrow.  Told her that she can call after her MRI to see if we have had any cancellations.  If not she was encouraged to go to the ER.  She states that she will call, however wants to wait until her appt on Wednesday but will go to the ER if she gets to feeling too bad.

## 2023-04-13 ENCOUNTER — Telehealth: Payer: Self-pay | Admitting: *Deleted

## 2023-04-13 ENCOUNTER — Emergency Department (HOSPITAL_COMMUNITY)
Admission: EM | Admit: 2023-04-13 | Discharge: 2023-04-13 | Disposition: A | Discharge status: Home / Self Care | Payer: Medicare HMO | Attending: Student | Admitting: Student

## 2023-04-13 ENCOUNTER — Other Ambulatory Visit: Payer: Self-pay

## 2023-04-13 ENCOUNTER — Encounter (HOSPITAL_COMMUNITY): Payer: Self-pay | Admitting: *Deleted

## 2023-04-13 DIAGNOSIS — E039 Hypothyroidism, unspecified: Secondary | ICD-10-CM | POA: Insufficient documentation

## 2023-04-13 DIAGNOSIS — R112 Nausea with vomiting, unspecified: Secondary | ICD-10-CM | POA: Diagnosis present

## 2023-04-13 DIAGNOSIS — I10 Essential (primary) hypertension: Secondary | ICD-10-CM | POA: Diagnosis not present

## 2023-04-13 DIAGNOSIS — Z79899 Other long term (current) drug therapy: Secondary | ICD-10-CM | POA: Diagnosis not present

## 2023-04-13 DIAGNOSIS — R5383 Other fatigue: Secondary | ICD-10-CM | POA: Insufficient documentation

## 2023-04-13 DIAGNOSIS — Z794 Long term (current) use of insulin: Secondary | ICD-10-CM | POA: Diagnosis not present

## 2023-04-13 DIAGNOSIS — R1110 Vomiting, unspecified: Secondary | ICD-10-CM

## 2023-04-13 DIAGNOSIS — R1115 Cyclical vomiting syndrome unrelated to migraine: Secondary | ICD-10-CM | POA: Diagnosis not present

## 2023-04-13 DIAGNOSIS — C20 Malignant neoplasm of rectum: Secondary | ICD-10-CM | POA: Diagnosis present

## 2023-04-13 DIAGNOSIS — K1379 Other lesions of oral mucosa: Secondary | ICD-10-CM

## 2023-04-13 DIAGNOSIS — Z85048 Personal history of other malignant neoplasm of rectum, rectosigmoid junction, and anus: Secondary | ICD-10-CM | POA: Diagnosis not present

## 2023-04-13 DIAGNOSIS — E1140 Type 2 diabetes mellitus with diabetic neuropathy, unspecified: Secondary | ICD-10-CM | POA: Insufficient documentation

## 2023-04-13 DIAGNOSIS — E876 Hypokalemia: Secondary | ICD-10-CM | POA: Diagnosis present

## 2023-04-13 DIAGNOSIS — R64 Cachexia: Secondary | ICD-10-CM

## 2023-04-13 DIAGNOSIS — E860 Dehydration: Secondary | ICD-10-CM

## 2023-04-13 LAB — COMPREHENSIVE METABOLIC PANEL
ALT: 14 U/L (ref 0–44)
AST: 24 U/L (ref 15–41)
Albumin: 2.9 g/dL — ABNORMAL LOW (ref 3.5–5.0)
Alkaline Phosphatase: 111 U/L (ref 38–126)
Anion gap: 8 (ref 5–15)
BUN: 5 mg/dL — ABNORMAL LOW (ref 8–23)
CO2: 19 mmol/L — ABNORMAL LOW (ref 22–32)
Calcium: 8.3 mg/dL — ABNORMAL LOW (ref 8.9–10.3)
Chloride: 108 mmol/L (ref 98–111)
Creatinine, Ser: 0.86 mg/dL (ref 0.44–1.00)
GFR, Estimated: >60 mL/min (ref >60)
Glucose, Bld: 201 mg/dL — ABNORMAL HIGH (ref 70–99)
Potassium: 3 mmol/L — ABNORMAL LOW (ref 3.5–5.1)
Sodium: 135 mmol/L (ref 135–145)
Total Bilirubin: 0.5 mg/dL (ref 0.3–1.2)
Total Protein: 5.9 g/dL — ABNORMAL LOW (ref 6.5–8.1)

## 2023-04-13 LAB — CBC WITH DIFFERENTIAL/PLATELET
Abs Immature Granulocytes: 0.1 10*3/uL — ABNORMAL HIGH (ref 0.00–0.07)
Band Neutrophils: 5 %
Basophils Absolute: 0 10*3/uL (ref 0.0–0.1)
Basophils Relative: 1 %
Eosinophils Absolute: 0 10*3/uL (ref 0.0–0.5)
Eosinophils Relative: 0 %
HCT: 28.8 % — ABNORMAL LOW (ref 36.0–46.0)
Hemoglobin: 9.5 g/dL — ABNORMAL LOW (ref 12.0–15.0)
Lymphocytes Relative: 24 %
Lymphs Abs: 1.1 10*3/uL (ref 0.7–4.0)
MCH: 29.9 pg (ref 26.0–34.0)
MCHC: 33 g/dL (ref 30.0–36.0)
MCV: 90.6 fL (ref 80.0–100.0)
Metamyelocytes Relative: 2 %
Monocytes Absolute: 0.4 10*3/uL (ref 0.1–1.0)
Monocytes Relative: 8 %
Myelocytes: 1 %
Neutro Abs: 3 10*3/uL (ref 1.7–7.7)
Neutrophils Relative %: 59 %
Platelets: 111 10*3/uL — ABNORMAL LOW (ref 150–400)
RBC: 3.18 MIL/uL — ABNORMAL LOW (ref 3.87–5.11)
RDW: 24.4 % — ABNORMAL HIGH (ref 11.5–15.5)
WBC: 4.7 10*3/uL (ref 4.0–10.5)
nRBC: 1.9 % — ABNORMAL HIGH (ref 0.0–0.2)

## 2023-04-13 LAB — TSH: TSH: 1.951 u[IU]/mL (ref 0.350–4.500)

## 2023-04-13 LAB — MAGNESIUM: Magnesium: 1.4 mg/dL — ABNORMAL LOW (ref 1.7–2.4)

## 2023-04-13 MED ORDER — PROCHLORPERAZINE MALEATE 10 MG PO TABS: 10.0000 mg | ORAL_TABLET | Freq: Three times a day (TID) | ORAL | 3 refills | Status: DC | PRN

## 2023-04-13 MED ORDER — SUCRALFATE 1 GM/10ML PO SUSP
1.0000 g | Freq: Four times a day (QID) | ORAL | 2 refills | Status: DC
Start: 2023-04-13 — End: 2023-04-28

## 2023-04-13 MED ORDER — ONDANSETRON 4 MG PO TBDP
4.0000 mg | ORAL_TABLET | Freq: Three times a day (TID) | ORAL | 1 refills | Status: DC | PRN
Start: 2023-04-13 — End: 2023-10-11

## 2023-04-13 MED ORDER — LACTATED RINGERS IV BOLUS
1000.0000 mL | Freq: Once | INTRAVENOUS | Status: AC
  Administered 2023-04-13: 1000 mL via INTRAVENOUS

## 2023-04-13 MED ORDER — MAGNESIUM OXIDE -MG SUPPLEMENT 400 (240 MG) MG PO TABS
800.0000 mg | ORAL_TABLET | Freq: Once | ORAL | Status: DC
  Filled 2023-04-13: qty 2

## 2023-04-13 MED ORDER — CYANOCOBALAMIN 1000 MCG/ML IJ SOLN: 1000.0000 ug | INTRAMUSCULAR | 3 refills | Status: DC

## 2023-04-13 MED ORDER — POTASSIUM CHLORIDE 10 MEQ/100ML IV SOLN
10.0000 meq | Freq: Once | INTRAVENOUS | Status: AC
  Administered 2023-04-13: 10 meq via INTRAVENOUS
  Filled 2023-04-13: qty 100

## 2023-04-13 MED ORDER — POTASSIUM CHLORIDE 10 MEQ/100ML IV SOLN
10.0000 meq | INTRAVENOUS | Status: AC
  Administered 2023-04-13 (×4): 10 meq via INTRAVENOUS
  Filled 2023-04-13 (×4): qty 100

## 2023-04-13 MED ORDER — MAGNESIUM OXIDE -MG SUPPLEMENT 400 (240 MG) MG PO TABS: 800.0000 mg | ORAL_TABLET | Freq: Once | ORAL | Status: DC

## 2023-04-13 MED ORDER — MAGNESIUM SULFATE 2 GM/50ML IV SOLN
2.0000 g | Freq: Once | INTRAVENOUS | Status: AC
  Administered 2023-04-13: 2 g via INTRAVENOUS
  Filled 2023-04-13: qty 50

## 2023-04-13 MED ORDER — ONDANSETRON HCL 4 MG/2ML IJ SOLN
8.0000 mg | Freq: Once | INTRAMUSCULAR | Status: AC
  Administered 2023-04-13: 8 mg via INTRAVENOUS
  Filled 2023-04-13: qty 4

## 2023-04-13 MED ORDER — PANTOPRAZOLE SODIUM 40 MG PO TBEC: 40.0000 mg | DELAYED_RELEASE_TABLET | Freq: Two times a day (BID) | ORAL | 3 refills | Status: DC

## 2023-04-13 MED ORDER — PROCHLORPERAZINE EDISYLATE 10 MG/2ML IJ SOLN
10.0000 mg | Freq: Once | INTRAMUSCULAR | Status: AC
  Administered 2023-04-13: 10 mg via INTRAVENOUS
  Filled 2023-04-13: qty 2

## 2023-04-13 MED ORDER — POTASSIUM CHLORIDE CRYS ER 20 MEQ PO TBCR: 40.0000 meq | EXTENDED_RELEASE_TABLET | Freq: Once | ORAL | Status: DC

## 2023-04-13 MED ORDER — FOLIC ACID 1 MG PO TABS: 1.0000 mg | ORAL_TABLET | Freq: Every day | ORAL | 6 refills | Status: DC

## 2023-04-13 MED ORDER — METOCLOPRAMIDE HCL 5 MG/ML IJ SOLN
5.0000 mg | Freq: Once | INTRAMUSCULAR | Status: AC
  Administered 2023-04-13: 5 mg via INTRAVENOUS
  Filled 2023-04-13: qty 2

## 2023-04-13 MED ORDER — POTASSIUM CHLORIDE 40 MEQ/15ML (20%) PO SOLN
15.0000 mL | Freq: Three times a day (TID) | ORAL | 0 refills | Status: DC
Start: 2023-04-13 — End: 2023-10-11

## 2023-04-13 MED ORDER — POTASSIUM CHLORIDE 20 MEQ PO PACK
40.0000 meq | PACK | Freq: Once | ORAL | Status: AC
  Administered 2023-04-13: 40 meq via ORAL
  Filled 2023-04-13: qty 2

## 2023-04-13 MED ORDER — POTASSIUM CHLORIDE CRYS ER 20 MEQ PO TBCR
40.0000 meq | EXTENDED_RELEASE_TABLET | Freq: Once | ORAL | Status: DC
  Filled 2023-04-13: qty 2

## 2023-04-13 MED ORDER — FLUOXETINE HCL 20 MG PO CAPS: 20.0000 mg | ORAL_CAPSULE | Freq: Every day | ORAL | 2 refills | Status: DC

## 2023-04-13 MED ORDER — DIPHENHYDRAMINE HCL 50 MG/ML IJ SOLN
25.0000 mg | Freq: Once | INTRAMUSCULAR | Status: AC
  Administered 2023-04-13: 25 mg via INTRAVENOUS
  Filled 2023-04-13: qty 1

## 2023-04-13 MED ORDER — PROCHLORPERAZINE 25 MG RE SUPP
25.0000 mg | Freq: Once | RECTAL | Status: AC
  Administered 2023-04-13: 25 mg via RECTAL
  Filled 2023-04-13: qty 1

## 2023-04-13 NOTE — ED Provider Notes (Signed)
[place] EMERGENCY DEPARTMENT AT [facility] Provider Note  CSN: [id number] Arrival date & time: 04/13/23 4098  Chief Complaint(s) No chief complaint on file.  HPI [name] is a 67 y.o. female with PMH rectal cancer on active chemotherapy, severe microcytic anemia, electrolyte derangements secondary to chemotherapy intolerance who presents emergency department for evaluation of generalized fatigue and concern for electrolyte derangements and dehydration.  Patient is seen almost every 2 days for fluids and potassium repletion but there was no appointment available today at the infusion center and thus she was told to come to the emergency department for fluids and electrolyte correction.  She denies abdominal pain, chest pain, shortness breath or other systemic symptoms.   Past Medical History Past Medical History:  Diagnosis Date   Anxiety    Cancer (HCC)    Coronary atherosclerosis    Cardiac CT 09/2018 with calcium score 8.7 and mild proximal LAD disease   Essential hypertension 2009   GERD (gastroesophageal reflux disease)    Hypothyroidism    Iron deficiency anemia    Osteoarthritis    Palpitations    Tachypalpitations on beta blockers since 2010   Type 2 diabetes mellitus (HCC) 2003   Vitamin B 12 deficiency    Vitamin D deficiency    Patient Active Problem List   Diagnosis Date Noted   Rectal bleeding 01/27/2023   Acute blood loss anemia 01/27/2023   Pancytopenia (HCC) 01/27/2023   Rectal cancer (HCC) 01/03/2023   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   Vomiting and diarrhea 09/11/2018   Hypokalemia 09/11/2018   Hyponatremia 09/11/2018   Gastroenteritis 09/11/2018   Chest pain 08/17/2018   Mild aortic stenosis 08/17/2018   Iron deficiency anemia 10/05/2017   Recurrent major depressive disorder, in partial remission (HCC) 10/06/2016   Mixed hyperlipidemia 02/01/2016   Hyperthyroidism 02/01/2016    Arthritis 02/01/2016   GERD (gastroesophageal reflux disease) 02/01/2016   Hypertension 02/01/2016   Uncontrolled type 2 diabetes mellitus with peripheral neuropathy 02/01/2016   Vitamin D deficiency 02/01/2016   Palpitations 04/03/2011   Morbid obesity (HCC) 04/03/2011   Essential hypertension, benign 04/03/2011   Type 2 diabetes mellitus with complication, with long-term current use of insulin (HCC) 04/03/2011   Home Medication(s) Prior to Admission medications   Medication Sig Start Date End Date Taking? Authorizing Provider  blood glucose meter kit and supplies KIT Dispense based on patient and insurance preference. Use up to four times daily as directed. (FOR ICD-10 E11.65) Number of Strips 400 Number of Lancets 400 02/16/23   Roma Kayser, MD  Blood Glucose Monitoring Suppl (TRUE METRIX METER) w/Device KIT 1 each by Does not apply route 2 (two) times daily. Use to test BG bid E11.65 03/19/23   Dani Gobble, NP  Cholecalciferol (VITAMIN D3) 5000 units TABS Take 5,000 Units by mouth daily.    [provider]  cyanocobalamin (VITAMIN B12) 1000 MCG/ML injection B12 shots at a frequency of once weekly for 4 weeks then can drop down to once monthly thereafter. 10/22/22   [provider]  cyclobenzaprine (FLEXERIL) 10 MG tablet Take 10 mg by mouth 3 (three) times daily as needed for muscle spasms. 01/08/22   [provider]  dextrose 5 % SOLN 1,000 mL with fluorouracil 5 GM/100ML SOLN Inject into the vein over 48 hr. Every 14 days 01/20/23   [provider]  diphenoxylate-atropine (LOMOTIL) 2.5-0.025 MG tablet Take 1 tablet by mouth 4 (four) times daily.  03/25/23   Carnella Guadalajara, PA-C  docusate sodium (COLACE) 100 MG capsule Take 1 capsule (100 mg total) by mouth 2 (two) times daily. Do NOT take this if you have diarrhea.  Try to maintain soft easy to pass bowel movements. 01/27/23   Carnella Guadalajara, PA-C  dronabinol (MARINOL) 5 MG  capsule Take 1 capsule (5 mg total) by mouth 2 (two) times daily before a meal. 03/25/23   Pennington, Rushie Goltz, PA-C  DROPLET PEN NEEDLES 31G X 5 MM MISC USE AS INSTRUCTED TO INJECT INSULIN DAILY 05/18/22   Dani Gobble, NP  FLUOROURACIL IV Inject into the vein every 14 (fourteen) days. 01/20/23   [provider]  FLUoxetine (PROZAC) 20 MG capsule Take 20 mg by mouth daily.    [provider]  folic acid (FOLVITE) 1 MG tablet Take 1 tablet (1 mg total) by mouth daily. 01/20/23   [name], MD  furosemide (LASIX) 20 MG tablet Take 1 tablet (20 mg total) by mouth daily as needed. Take extra potassium days that lasix is needed 03/08/23   [name], MD  HYDROcodone-acetaminophen (NORCO) 5-325 MG tablet Take 1 tablet by mouth every 6 (six) hours as needed for severe pain. 01/27/23   Carnella Guadalajara, PA-C  insulin aspart (NOVOLOG FLEXPEN) 100 UNIT/ML FlexPen Inject 0-6 Units into the skin 3 (three) times daily with meals. 03/19/23   Dani Gobble, NP  insulin glargine (LANTUS SOLOSTAR) 100 UNIT/ML Solostar Pen Inject 40 Units into the skin at bedtime. 03/11/23   Dani Gobble, NP  IRINOTECAN HCL IV Inject into the vein every 14 (fourteen) days. 01/20/23   [provider]  LEUCOVORIN CALCIUM IV Inject into the vein every 14 (fourteen) days. 01/20/23   [provider]  levothyroxine (SYNTHROID) 88 MCG tablet TAKE 1 TABLET EVERY DAY BEFORE BREAKFAST 04/09/23   Dani Gobble, NP  loperamide (IMODIUM) 2 MG capsule Take 1 capsule (2 mg total) by mouth as needed for diarrhea or loose stools. TAKE 1 CAPSULE AS DIRECTED AS NEEDED FOR DIARRHEA OR LOOSE STOOLS 04/07/23   [name], MD  magnesium oxide (MAG-OX) 400 (240 Mg) MG tablet Take 1 tablet (400 mg total) by mouth 2 (two) times daily. 02/12/23   Carnella Guadalajara, PA-C  OLANZapine (ZYPREXA) 5 MG tablet Take 2 tablets (10 mg total) by mouth at bedtime. 04/05/23   Carnella Guadalajara, PA-C  ondansetron (ZOFRAN-ODT) 4 MG disintegrating tablet DISSOLVE 2 TAB ON TONGUE EVERY 8HR AS NEEDED NAUSEA, VOMITING (BETWEEN COMPAZINE DOSES FOR MAX RELIEF) 04/10/23   Rojelio Brenner M, PA-C  OXALIPLATIN IV Inject into the vein every 14 (fourteen) days. 01/20/23   [provider]  pantoprazole (PROTONIX) 40 MG tablet Take 1 tablet (40 mg total) by mouth 2 (two) times daily before a meal. 01/26/23   Aida Raider, NP  Potassium Chloride 40 MEQ/15ML (20%) SOLN Take 15 mLs by mouth 3 (three) times daily. 04/05/23   Carnella Guadalajara, PA-C  pregabalin (LYRICA) 100 MG capsule Take 1 capsule (100 mg total) by mouth 2 (two) times daily. 12/24/22   Dani Gobble, NP  prochlorperazine (COMPAZINE) 10 MG tablet TAKE 1 TABLET EVERY 6 HOURS AS NEEDED FOR NAUSEA OR VOMITING 03/16/23   [name], MD  sucralfate (CARAFATE) 1 GM/10ML suspension Take 10 mLs (1 g total) by mouth 4 (four) times daily. Swish and swallow 02/12/23   Rojelio Brenner M, PA-C  TRUE METRIX BLOOD GLUCOSE TEST test strip  TEST BLOOD SUGAR TWICE DAILY 11/10/22   Roma Kayser, MD                                                                                                                                    Past Surgical History Past Surgical History:  Procedure Laterality Date   ABDOMINAL HYSTERECTOMY     History of cervical cancer   BIOPSY  12/16/2022   Procedure: BIOPSY;  Surgeon: Corbin Ade, MD;  Location: [facility];  Service: Endoscopy;;  gastric, rectal   BREAST BIOPSY Right    COLONOSCOPY WITH PROPOFOL N/A 12/16/2022   Procedure: COLONOSCOPY WITH PROPOFOL;  Surgeon: Corbin Ade, MD;  Location: [facility];  Service: Endoscopy;  Laterality: N/A;  10:45 am   ESOPHAGOGASTRODUODENOSCOPY (EGD) WITH PROPOFOL N/A 12/16/2022   Procedure: ESOPHAGOGASTRODUODENOSCOPY (EGD) WITH PROPOFOL;  Surgeon: Corbin Ade, MD;  Location: [facility];  Service: Endoscopy;  Laterality:  N/A;   IR IMAGING GUIDED PORT INSERTION  01/15/2023   MALONEY DILATION N/A 12/16/2022   Procedure: Elease Hashimoto DILATION;  Surgeon: Corbin Ade, MD;  Location: [facility];  Service: Endoscopy;  Laterality: N/A;   POLYPECTOMY  12/16/2022   Procedure: POLYPECTOMY;  Surgeon: Corbin Ade, MD;  Location: [facility];  Service: Endoscopy;;   TONSILLECTOMY     Family History Family History  Problem Relation Age of Onset   Heart failure Mother    Diabetes Father    Hypertension Father    Stroke Sister 51   Diabetes Brother    Cancer - Colon Other        age 70   Colon polyps Neg Hx     Social History Social History   Tobacco Use   Smoking status: Never   Smokeless tobacco: Never  Vaping Use   Vaping Use: Never used  Substance Use Topics   Alcohol use: No   Drug use: No   Allergies Contrast media [iodinated contrast media], Sulfa antibiotics, Doxepin, and Tape  Review of Systems Review of Systems  Constitutional:  Positive for fatigue.  Neurological:  Positive for weakness.    Physical Exam Vital Signs  I have reviewed the triage vital signs There were no vitals taken for this visit.  Physical Exam Vitals and nursing note reviewed.  Constitutional:      General: She is not in acute distress.    Appearance: She is well-developed. She is ill-appearing.  HENT:     Head: Normocephalic and atraumatic.  Eyes:     Conjunctiva/sclera: Conjunctivae normal.  Cardiovascular:     Rate and Rhythm: Normal rate and regular rhythm.     Heart sounds: No murmur heard. Pulmonary:     Effort: Pulmonary effort is normal. No respiratory distress.  Musculoskeletal:        General: No swelling.     Cervical back: Neck supple.  Skin:    General:  Skin is warm and dry.     Capillary Refill: Capillary refill takes less than 2 seconds.     Coloration: Skin is pale.  Neurological:     Mental Status: She is alert.  Psychiatric:        Mood and Affect: Mood normal.     ED  Results and Treatments Labs (all labs ordered are listed, but only abnormal results are displayed) Labs Reviewed  CBC WITH DIFFERENTIAL/PLATELET  COMPREHENSIVE METABOLIC PANEL  MAGNESIUM                                                                                                                          Radiology No results found.  Pertinent labs & imaging results that were available during my care of the patient were reviewed by me and considered in my medical decision making (see MDM for details).  Medications Ordered in ED Medications  lactated ringers bolus 1,000 mL (has no administration in time range)                                                                                                                                     Procedures .Critical Care  Performed by: Glendora Score, MD Authorized by: Glendora Score, MD   Critical care provider statement:    Critical care time (minutes):  30   Critical care was necessary to treat or prevent imminent or life-threatening deterioration of the following conditions:  Dehydration   Critical care was time spent personally by me on the following activities:  Development of treatment plan with patient or surrogate, discussions with consultants, evaluation of patient's response to treatment, examination of patient, ordering and review of laboratory studies, ordering and review of radiographic studies, ordering and performing treatments and interventions, pulse oximetry, re-evaluation of patient's condition and review of old charts   (including critical care time)  Medical Decision Making / ED Course   This patient presents to the ED for concern of nausea and vomiting, this involves an extensive number of treatment options, and is a complaint that carries with it a high risk of complications and morbidity.  The differential diagnosis includes chemotherapy side effect, gastroenteritis, electrolyte abnormality, dehydration,  abdominal mets  MDM: Patient seen emergency room for evaluation of nausea and vomiting.  Physical exam reveals a pale appearing patient with dry tacky mucous membranes.  Laboratory evaluation with a hemoglobin of 9.5,  hypokalemia to 3.0, CO2 19, albumin 2.9, hypomagnesemia to 1.4, TSH is normal.  Patient received fluid resuscitation and IV mag and potassium repletion and I attempted to replete the patient's electrolytes orally but patient had persistent vomiting.  We trialed 8 mg of IV Zofran and patient again failed p.o. challenge.  We then trialed Compazine and Benadryl and on third p.o. challenge patient again had persistent vomiting.  Due to intractable nausea and vomiting secondary to chemotherapy side effect patient require hospital admission.  Patient admitted.   Additional history obtained: -Additional history obtained from daughter -External records from outside source obtained and reviewed including: Chart review including previous notes, labs, imaging, consultation notes   Lab Tests: -I ordered, reviewed, and interpreted labs.   The pertinent results include:   Labs Reviewed  CBC WITH DIFFERENTIAL/PLATELET  COMPREHENSIVE METABOLIC PANEL  MAGNESIUM    Medicines ordered and prescription drug management: Meds ordered this encounter  Medications   DISCONTD: magnesium oxide (MAG-OX) tablet 800 mg   lactated ringers bolus 1,000 mL    -I have reviewed the patients home medicines and have made adjustments as needed  Critical interventions Fluid resuscitation, electrolyte repletion    Cardiac Monitoring: The patient was maintained on a cardiac monitor.  I personally viewed and interpreted the cardiac monitored which showed an underlying rhythm of: NSR  Social Determinants of Health:  Factors impacting patients care include: none   Reevaluation: After the interventions noted above, I reevaluated the patient and found that they have :stayed the same  Co morbidities that  complicate the patient evaluation  Past Medical History:  Diagnosis Date   Anxiety    Cancer (HCC)    Coronary atherosclerosis    Cardiac CT 09/2018 with calcium score 8.7 and mild proximal LAD disease   Essential hypertension 2009   GERD (gastroesophageal reflux disease)    Hypothyroidism    Iron deficiency anemia    Osteoarthritis    Palpitations    Tachypalpitations on beta blockers since 2010   Type 2 diabetes mellitus (HCC) 2003   Vitamin B 12 deficiency    Vitamin D deficiency       Dispostion: I considered admission for this patient, and due to intractable nausea and vomiting patient require hospital admission.     Final Clinical Impression(s) / ED Diagnoses Final diagnoses:  None     @PCDICTATION @    Glendora Score, MD 04/13/23 [phone]

## 2023-04-13 NOTE — ED Triage Notes (Signed)
Pt states she usually has to get potassium and magnesium infusions every other day and states she missed her appointment yesterday due to having to have a MRI  Pt's last infusion was Friday  Pt states she started feeling weak Sunday

## 2023-04-13 NOTE — ED Notes (Signed)
Dr Marisa Severin E at bedside.

## 2023-04-13 NOTE — Telephone Encounter (Signed)
Patient called feeling weak and bad in general.  Had requested fluids yesterday, however there were no appointments available and she should go to the ER, as she has treatment tomorrow.  She called again this am with same complaints and said she cannot wait until tomorrow.  Advised that she needed to go to the ER to make sure her labs were stable and receive supplements and IV fluids if needed.  Verbalized understanding.

## 2023-04-13 NOTE — Consult Note (Addendum)
Initial Consultation Note                                                                                                                                                                           Patient Demographics:    [name], is a 67 y.o. female  MRN: [id number]   DOB - 07-01-1956  Admit Date - 04/13/2023  Outpatient Primary MD for the patient is Gwenlyn Found, MD   Assessment & Plan:   Assessment and Plan:  1)Nausea and Vomiting Post chemoRx--discussed with Dr. Ellin Saba and Rojelio Brenner-- --patient received antiemetics and IV fluids as well as electrolyte replacement in the ED -Discharge home with daughter from the ED -Follow-up with oncology team on 04/14/2023 at 8:30 AM as previously scheduled for reevaluation and repeat labs  2)Hypokalemia/Hypomagnesemia--- in the setting of ongoing chemotherapy with chemo induced nausea and vomiting and poor oral intake -Electrolytes replaced iv and  orally  3)Stage IIc Rectal Cancer --- patient was treated with chemo/immunotherapy/immunotherapy follow-up with oncology team for ongoing Rx -Last treated: Cycle #6 started 03/31/2023 with pump D/C and Udenyca on 04/02/2023   4) depression/anxiety--- patient is not compliant with Prozac, continue Zyprexa  5)DM2 with diabetic neuropathy--apparently endocrinology stopped Mounjaro metformin and insulin -Oral intake is poor--so avoid overaggressive diabetic control to avoid hypoglycemia -Continue Lyrica for neuropathy  6) chronic malignancy and chemo induced anemia and thrombocytopenia----stable at this time close to baseline  Plan of care reviewed and discussed with patient's primary oncologist Dr. Ellin Saba and his midlevel provider Elnita Maxwell advised to keep the appointment with the oncology team on 04/14/2023 at 8:30 AM for reevaluation and possible lab work and possible electrolyte and fluid replacements -Oncology team will work with patient to figure out which  agent would work best for her chemo induced nausea and vomiting... As well as antidepressant/antianxiety agent - Patient's daughter April at bedside verbalizes understanding of the plan -Okay to discharge home from the ED  Discharge Instructions:- 1)Drink small, frequent amount of fluids.Marland KitchenMarland Kitchen  2)Please keep your appointment with Oncology office on 4th floor tomorrow --Wednesday 04/14/23 at 0830 am  3) your oncology office with talk to you about possibly adjusting your anti-nausea medications and your appetite stimulants, you probably need an antidepressant as well 4)Your  blood work including CBC and CMP blood test to be repeated tomorrow during your appointment on fourth floor   Dispo: The patient is from: Home              Anticipated d/c is to: Home with Daughter  Allergies as of 04/13/2023       Reactions   Contrast Media [iodinated Contrast Media] Other (See Comments)   Burning   Sulfa Antibiotics Nausea And  Vomiting   Other reaction(s): GI Upset (intolerance) unknown   Doxepin Rash, Swelling   Tape Rash        Medication List     STOP taking these medications    cyclobenzaprine 10 MG tablet Commonly known as: FLEXERIL   dextrose 5 % SOLN 1,000 mL with fluorouracil 5 GM/100ML SOLN   docusate sodium 100 MG capsule Commonly known as: Colace   dronabinol 5 MG capsule Commonly known as: MARINOL   furosemide 20 MG tablet Commonly known as: Lasix   Lantus SoloStar 100 UNIT/ML Solostar Pen Generic drug: insulin glargine   magnesium oxide 400 (240 Mg) MG tablet Commonly known as: MAG-OX   metFORMIN 1000 MG tablet Commonly known as: GLUCOPHAGE   pregabalin 100 MG capsule Commonly known as: LYRICA       TAKE these medications    blood glucose meter kit and supplies Kit Dispense based on patient and insurance preference. Use up to four times daily as directed. (FOR ICD-10 E11.65) Number of Strips 400 Number of Lancets 400   cyanocobalamin 1000 MCG/ML  injection Commonly known as: VITAMIN B12 Inject 1 mL (1,000 mcg total) into the muscle every 30 (thirty) days. What changed: See the new instructions.   diphenoxylate-atropine 2.5-0.025 MG tablet Commonly known as: LOMOTIL Take 1 tablet by mouth 4 (four) times daily. What changed: how much to take   Droplet Pen Needles 31G X 5 MM Misc Generic drug: Insulin Pen Needle USE AS INSTRUCTED TO INJECT INSULIN DAILY   FLUOROURACIL IV Inject into the vein every 14 (fourteen) days.   FLUoxetine 20 MG capsule Commonly known as: PROZAC Take 1 capsule (20 mg total) by mouth daily.   folic acid 1 MG tablet Commonly known as: FOLVITE Take 1 tablet (1 mg total) by mouth daily.   HYDROcodone-acetaminophen 5-325 MG tablet Commonly known as: Norco Take 1 tablet by mouth every 6 (six) hours as needed for severe pain.   IRINOTECAN HCL IV Inject into the vein every 14 (fourteen) days.   LEUCOVORIN CALCIUM IV Inject into the vein every 14 (fourteen) days.   levothyroxine 88 MCG tablet Commonly known as: SYNTHROID TAKE 1 TABLET EVERY DAY BEFORE BREAKFAST What changed: See the new instructions.   loperamide 2 MG capsule Commonly known as: IMODIUM Take 1 capsule (2 mg total) by mouth as needed for diarrhea or loose stools. TAKE 1 CAPSULE AS DIRECTED AS NEEDED FOR DIARRHEA OR LOOSE STOOLS   NovoLOG FlexPen 100 UNIT/ML FlexPen Generic drug: insulin aspart Inject 0-6 Units into the skin 3 (three) times daily with meals.   OLANZapine 5 MG tablet Commonly known as: ZyPREXA Take 2 tablets (10 mg total) by mouth at bedtime.   ondansetron 4 MG disintegrating tablet Commonly known as: ZOFRAN-ODT Take 1 tablet (4 mg total) by mouth every 8 (eight) hours as needed for nausea or vomiting. DISSOLVE 2 TAB ON TONGUE EVERY 8HR AS NEEDED NAUSEA, VOMITING (BETWEEN COMPAZINE DOSES FOR MAX RELIEF) Strength: 4 mg What changed: See the new instructions.   OXALIPLATIN IV Inject into the vein every 14  (fourteen) days.   pantoprazole 40 MG tablet Commonly known as: PROTONIX Take 1 tablet (40 mg total) by mouth 2 (two) times daily before a meal.   Potassium Chloride 40 MEQ/15ML (20%) Soln Take 15 mLs by mouth 3 (three) times daily.   prochlorperazine 10 MG tablet Commonly known as: COMPAZINE Take 1 tablet (10 mg total) by mouth every 8 (eight) hours as needed for nausea or vomiting.  TAKE 1 TABLET EVERY 6 HOURS AS NEEDED FOR NAUSEA OR VOMITING Strength: 10 mg What changed: See the new instructions.   sucralfate 1 GM/10ML suspension Commonly known as: Carafate Take 10 mLs (1 g total) by mouth 4 (four) times daily. Swish and swallow   True Metrix Blood Glucose Test test strip Generic drug: glucose blood TEST BLOOD SUGAR TWICE DAILY   True Metrix Meter w/Device Kit 1 each by Does not apply route 2 (two) times daily. Use to test BG bid E11.65       With History of - Reviewed by me  Past Medical History:  Diagnosis Date   Anxiety    Cancer (HCC)    Coronary atherosclerosis    Cardiac CT 09/2018 with calcium score 8.7 and mild proximal LAD disease   Essential hypertension 2009   GERD (gastroesophageal reflux disease)    Hypothyroidism    Iron deficiency anemia    Osteoarthritis    Palpitations    Tachypalpitations on beta blockers since 2010   Type 2 diabetes mellitus (HCC) 2003   Vitamin B 12 deficiency    Vitamin D deficiency       Past Surgical History:  Procedure Laterality Date   ABDOMINAL HYSTERECTOMY     History of cervical cancer   BIOPSY  12/16/2022   Procedure: BIOPSY;  Surgeon: Corbin Ade, MD;  Location: [facility];  Service: Endoscopy;;  gastric, rectal   BREAST BIOPSY Right    COLONOSCOPY WITH PROPOFOL N/A 12/16/2022   Procedure: COLONOSCOPY WITH PROPOFOL;  Surgeon: Corbin Ade, MD;  Location: [facility];  Service: Endoscopy;  Laterality: N/A;  10:45 am   ESOPHAGOGASTRODUODENOSCOPY (EGD) WITH PROPOFOL N/A 12/16/2022   Procedure:  ESOPHAGOGASTRODUODENOSCOPY (EGD) WITH PROPOFOL;  Surgeon: Corbin Ade, MD;  Location: [facility];  Service: Endoscopy;  Laterality: N/A;   IR IMAGING GUIDED PORT INSERTION  01/15/2023   MALONEY DILATION N/A 12/16/2022   Procedure: Elease Hashimoto DILATION;  Surgeon: Corbin Ade, MD;  Location: [facility];  Service: Endoscopy;  Laterality: N/A;   POLYPECTOMY  12/16/2022   Procedure: POLYPECTOMY;  Surgeon: Corbin Ade, MD;  Location: [facility];  Service: Endoscopy;;   TONSILLECTOMY      Chief Complaint  Patient presents with   Weakness      HPI:    [name]  is a 67 y.o. female with past medical history relevant for stage IIc rectal cancer, currently undergoing chemo hormone and immunotherapy... Intractable emesis and electrolyte derangement requiring frequent IV fluids and IV replacements, as well as history of DM2, depression/anxiety--and chemo induced anemia thrombocytopenia patient to the ED with persistent nausea and vomiting -Additional history obtained from patient's daughter April at bedside -No fever  Or chills  -Emesis is without bile or blood -Patient has an appointment with oncology team at 8:30 AM on 04/14/2023 but she felt like she could not wait until then so she came to the ED to get IV fluids and get treated for her nausea -Hgb is 9.5 which is close to recent baseline -Platelets 111 close to prior baseline, BBC is 4.7 -TSH 1.9 -Magnesium is 1.4 patient with a history of recurrent hypoglycemia -Potassium is 3.0 patient with history of persistent hypokalemia - Plan of care reviewed and discussed with patient's primary oncologist Dr. Ellin Saba and his midlevel provider Elnita Maxwell advised to keep the appointment with the oncology team on 04/14/2023 at 8:30 AM for reevaluation and possible lab work and possible electrolyte and  fluid replacements -Oncology team will work with patient to figure out which agent would work best for her chemo induced  nausea and vomiting... As well as antidepressant/antianxiety agent - Patient's daughter April at bedside verbalizes understanding of the plan -Okay to discharge home from the ED    Review of systems:    In addition to the HPI above,   A full Review of  Systems was done, all other systems reviewed are negative except as noted above in HPI , .   Social History:  Reviewed by me   Social History   Tobacco Use   Smoking status: Never   Smokeless tobacco: Never  Substance Use Topics   Alcohol use: No     Family History :  Reviewed by me    Family History  Problem Relation Age of Onset   Heart failure Mother    Diabetes Father    Hypertension Father    Stroke Sister 7   Diabetes Brother    Cancer - Colon Other        age 16   Colon polyps Neg Hx     Home Medications:   Prior to Admission medications   Medication Sig Start Date End Date Taking? Authorizing Provider  diphenoxylate-atropine (LOMOTIL) 2.5-0.025 MG tablet Take 1 tablet by mouth 4 (four) times daily. Patient taking differently: Take 2 tablets by mouth 4 (four) times daily. 03/25/23  Yes Pennington, Rebekah M, PA-C  FLUOROURACIL IV Inject into the vein every 14 (fourteen) days. 01/20/23  Yes [provider]  HYDROcodone-acetaminophen (NORCO) 5-325 MG tablet Take 1 tablet by mouth every 6 (six) hours as needed for severe pain. 01/27/23  Yes Pennington, Rebekah M, PA-C  IRINOTECAN HCL IV Inject into the vein every 14 (fourteen) days. 01/20/23  Yes [provider]  LEUCOVORIN CALCIUM IV Inject into the vein every 14 (fourteen) days. 01/20/23  Yes [provider]  levothyroxine (SYNTHROID) 88 MCG tablet TAKE 1 TABLET EVERY DAY BEFORE BREAKFAST Patient taking differently: Take 88 mcg by mouth daily before breakfast. 04/09/23  Yes Reardon, Freddi Starr, NP  loperamide (IMODIUM) 2 MG capsule Take 1 capsule (2 mg total) by mouth as needed for diarrhea or loose stools. TAKE 1 CAPSULE AS DIRECTED AS NEEDED  FOR DIARRHEA OR LOOSE STOOLS 04/07/23  Yes [name], MD  OLANZapine (ZYPREXA) 5 MG tablet Take 2 tablets (10 mg total) by mouth at bedtime. 04/05/23  Yes Pennington, Rebekah M, PA-C  blood glucose meter kit and supplies KIT Dispense based on patient and insurance preference. Use up to four times daily as directed. (FOR ICD-10 E11.65) Number of Strips 400 Number of Lancets 400 02/16/23   Roma Kayser, MD  Blood Glucose Monitoring Suppl (TRUE METRIX METER) w/Device KIT 1 each by Does not apply route 2 (two) times daily. Use to test BG bid E11.65 03/19/23   Dani Gobble, NP  cyanocobalamin (VITAMIN B12) 1000 MCG/ML injection Inject 1 mL (1,000 mcg total) into the muscle every 30 (thirty) days. 04/13/23   Shon Hale, MD  dronabinol (MARINOL) 5 MG capsule Take 1 capsule (5 mg total) by mouth 2 (two) times daily before a meal. Patient not taking: Reported on 04/13/2023 03/25/23   Carnella Guadalajara, PA-C  DROPLET PEN NEEDLES 31G X 5 MM MISC USE AS INSTRUCTED TO INJECT INSULIN DAILY 05/18/22   Dani Gobble, NP  FLUoxetine (PROZAC) 20 MG capsule Take 1 capsule (20 mg total) by mouth daily. 04/13/23   Shon Hale, MD  folic acid (FOLVITE) 1 MG tablet Take 1 tablet (1 mg total) by mouth daily. 04/13/23   Rylynn Schoneman, MD  insulin aspart (NOVOLOG FLEXPEN) 100 UNIT/ML FlexPen Inject 0-6 Units into the skin 3 (three) times daily with meals. Patient not taking: Reported on 04/13/2023 03/19/23   Dani Gobble, NP  insulin glargine (LANTUS SOLOSTAR) 100 UNIT/ML Solostar Pen Inject 40 Units into the skin at bedtime. Patient not taking: Reported on 04/13/2023 03/11/23   Dani Gobble, NP  magnesium oxide (MAG-OX) 400 (240 Mg) MG tablet Take 1 tablet (400 mg total) by mouth 2 (two) times daily. Patient not taking: Reported on 04/13/2023 02/12/23   Carnella Guadalajara, PA-C  metFORMIN (GLUCOPHAGE) 1000 MG tablet Take 1,000 mg by mouth 2 (two) times daily. Patient not  taking: Reported on 04/13/2023 04/09/23   [provider]  ondansetron (ZOFRAN-ODT) 4 MG disintegrating tablet Take 1 tablet (4 mg total) by mouth every 8 (eight) hours as needed for nausea or vomiting. DISSOLVE 2 TAB ON TONGUE EVERY 8HR AS NEEDED NAUSEA, VOMITING (BETWEEN COMPAZINE DOSES FOR MAX RELIEF) Strength: 4 mg 04/13/23   Shon Hale, MD  OXALIPLATIN IV Inject into the vein every 14 (fourteen) days. 01/20/23   [provider]  pantoprazole (PROTONIX) 40 MG tablet Take 1 tablet (40 mg total) by mouth 2 (two) times daily before a meal. 04/13/23   Zanya Lindo, MD  Potassium Chloride 40 MEQ/15ML (20%) SOLN Take 15 mLs by mouth 3 (three) times daily. 04/13/23   Shon Hale, MD  pregabalin (LYRICA) 100 MG capsule Take 1 capsule (100 mg total) by mouth 2 (two) times daily. Patient not taking: Reported on 04/13/2023 12/24/22   Dani Gobble, NP  prochlorperazine (COMPAZINE) 10 MG tablet Take 1 tablet (10 mg total) by mouth every 8 (eight) hours as needed for nausea or vomiting. TAKE 1 TABLET EVERY 6 HOURS AS NEEDED FOR NAUSEA OR VOMITING Strength: 10 mg 04/13/23   Shon Hale, MD  sucralfate (CARAFATE) 1 GM/10ML suspension Take 10 mLs (1 g total) by mouth 4 (four) times daily. Swish and swallow 04/13/23   Shon Hale, MD  TRUE METRIX BLOOD GLUCOSE TEST test strip TEST BLOOD SUGAR TWICE DAILY 11/10/22   Roma Kayser, MD     Allergies:     Allergies  Allergen Reactions   Contrast Media [Iodinated Contrast Media] Other (See Comments)    Burning   Sulfa Antibiotics Nausea And Vomiting    Other reaction(s): GI Upset (intolerance) unknown   Doxepin Rash and Swelling   Tape Rash     Physical Exam:   Vitals  Blood pressure (!) 92/43, pulse 89, temperature 98.3 F (36.8 C), temperature source Oral, resp. rate 15, height 5\' 7"  (1.702 m), weight 107 kg, SpO2 99 %.  Physical Examination: General appearance - alert,  in no distress, speaking in  complete sentences, chronically ill-appearing Mental status - alert, oriented to person, place, and time,  Eyes - sclera anicteric Neck - supple, no JVD elevation , Chest - clear  to auscultation bilaterally, symmetrical air movement,  Heart - S1 and S2 normal, regular, right-sided Port-A-Cath in situ Abdomen - soft, nontender, nondistended, +BS, increased truncal adiposity Neurological - screening mental status exam normal, neck supple without rigidity, cranial nerves II through XII intact, DTR's normal and symmetric Extremities - no pedal edema noted, intact peripheral pulses  Skin - warm, dry     Data Review:    CBC Recent Labs  Lab 04/07/23 1048 04/09/23  1191 04/13/23 0943  WBC 3.5* 3.4* 4.7  HGB 9.0* 8.7* 9.5*  HCT 27.1* 26.0* 28.8*  PLT 104* 86* 111*  MCV 90.3 89.7 90.6  MCH 30.0 30.0 29.9  MCHC 33.2 33.5 33.0  RDW 23.2* 23.1* 24.4*  LYMPHSABS 0.7 0.7 1.1  MONOABS 0.0* 0.2 0.4  EOSABS 0.0 0.0 0.0  BASOSABS 0.0 0.0 0.0   ------------------------------------------------------------------------------------------------------------------  Chemistries  Recent Labs  Lab 04/07/23 1048 04/09/23 0903 04/13/23 0943  NA 135 135 135  K 2.4* 2.9* 3.0*  CL 105 105 108  CO2 22 20* 19*  GLUCOSE 167* 170* 201*  BUN 6* <5* 5*  CREATININE 0.74 0.68 0.86  CALCIUM 8.2* 8.3* 8.3*  MG 1.6* 1.8 1.4*  AST 37 36 24  ALT 18 17 14   ALKPHOS 118 112 111  BILITOT 1.0 1.0 0.5   ------------------------------------------------------------------------------------------------------------------ estimated creatinine clearance is 82.2 mL/min (by C-G formula based on SCr of 0.86 mg/dL). ------------------------------------------------------------------------------------------------------------------ Recent Labs    04/13/23 0943  TSH 1.951   ------------------------------------------------------------------------------------------------------------------    Component Value Date/Time    BNP 22.0 06/27/2021 1650    Urinalysis    Component Value Date/Time   COLORURINE YELLOW 09/11/2018 0959   APPEARANCEUR HAZY (A) 09/11/2018 0959   LABSPEC 1.026 09/11/2018 0959   PHURINE 5.0 09/11/2018 0959   GLUCOSEU NEGATIVE 09/11/2018 0959   HGBUR SMALL (A) 09/11/2018 0959   BILIRUBINUR NEGATIVE 09/11/2018 0959   KETONESUR 20 (A) 09/11/2018 0959   PROTEINUR 30 (A) 09/11/2018 0959   UROBILINOGEN 0.2 03/21/2013 0800   NITRITE NEGATIVE 09/11/2018 0959   LEUKOCYTESUR NEGATIVE 09/11/2018 0959     Imaging Results:   Condition   stable  Shon Hale M.D on 04/13/2023 at 6:03 PM Go to [email or web address] -  for contact info  [facility] - Office  [phone]

## 2023-04-13 NOTE — Progress Notes (Signed)
[facility] [address], [place] [address]    Clinic Day:  04/14/2023  Referring physician: Gwenlyn Found, MD  Patient Care Team: Gwenlyn Found, MD as PCP - General (Family Medicine) Jonelle Sidle, MD as PCP - Cardiology (Cardiology) Jena Gauss Gerrit Friends, MD as Consulting Physician (Gastroenterology) [name], MD as Medical Oncologist (Medical Oncology) Therese Sarah, RN as Oncology Nurse Navigator (Medical Oncology)   ASSESSMENT & PLAN:   Assessment: 1.  Stage IIc (T3d/4b N0 M0) rectal cancer: - Rectal bleeding for the past 4 to 5 years. - Colonoscopy (12/16/2022): Tumor protruding through the anal orifice.  Bulky semilunar lateral rectal tumor extending from the anorectal junction proximally about 13 cm. 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  MSI-high not detected by [id number] - CT CAP (12/17/2022): Locally advanced low rectal primary without bowel obstruction or nodal metastasis.  Isolated right middle lobe subpleural lung nodule most likely benign.  Cirrhosis and portal venous hypertension.  2.6 cm right renal lesion with differential hemorrhagic/proteinaceous cyst or solid neoplasm. - PET scan (12/31/2022): Markedly hypermetabolic rectal lesion with no evidence for perirectal or pelvic lymphadenopathy.  No metastatic disease in the neck, chest, abdomen or pelvis.  Focal hypermetabolism in the region of the gallbladder neck corresponds to subtle 13 mm focus of increased attenuation in the lumen of the gallbladder.  Differential includes gallbladder polyp/neoplasm.  2.7 cm exophytic posterior right interpolar renal lesion shows no substantial hypermetabolism, likely cyst.  2.3 cm inferior right thyroid nodule without hypermetabolism. - MRI pelvis (12/24/2022): Extension through muscularis propria.  Tumor size 3.8 x 3 x 5.8 cm with a volume 35 cm.  T stage is T3d, probable T4.   Suspicion of involvement of pelvic floor musculature and the far posterior and left side of the vagina. - 6 cycles of FOLFIRINOX from 01/20/2023 through 03/31/2023 - MRI pelvis (04/12/2023): Tumor size has improved.  T3c N0.   2.  Social/family history: - She lives at home with her family.  She is accompanied by her daughter today.  She is independent of ADLs and IADLs.  She is a retired Production designer, theatre/television/film at [employer].  Non-smoker.  She reports having hysterectomy in 1986 for cancerous polyps in her endometrium. - Maternal grandmother's sister had colon cancer.    Plan: 1.  Stage IIc (T3d/4BN0M0) rectal cancer, MSI stable: - We reviewed MRI of the pelvis results which showed partial response with T3c N0. - She is having poor tolerance to chemotherapy with persistent nausea, decreased eating and diarrhea.  She is requiring IV fluids on a regular basis. - As she has gotten decent response on MRI of the pelvis, I have recommended stopping further chemotherapy. - We will move onto the next phase of treatment with radiation and Xeloda. - We discussed Xeloda twice daily Monday through Friday throughout the course of radiation.  Total dose based on her BSA would be 1850 mg, rounded off to 2000 mg twice daily. - We will start her on scopolamine patch for persistent nausea.  Will also include dexamethasone 2 mg in the mornings for nausea as well as appetite stimulation. - She will follow-up with radiation oncology.   2.  Peripheral neuropathy from diabetes: - She has no feeling in her toes.  She is not taking Lyrica.  No worsening of neuropathy since chemo started.   3.  Severe microcytic anemia: - Rectal bleeding has stopped after cycle 2.  Last  Venofer on 02/23/2023. - Hemoglobin today is 9.5.  Ferritin is 2274 and percent saturation 57.   4.  Hypokalemia: - Continue potassium twice daily.  Potassium today is 3.1.   5.  Hypomagnesemia: - Continue magnesium twice daily.  Magnesium was 1.9 today.    6.  Diarrhea: - She has 4-6 stools per day. - Continue Lomotil 2 tablets 4 times daily and Imodium as needed.    No orders of the defined types were placed in this encounter.     I,Katie Daubenspeck,acting as a Neurosurgeon for [name], MD.,have documented all relevant documentation on the behalf of [name], MD,as directed by  [name], MD while in the presence of [name], MD.   I, [name] MD, have reviewed the above documentation for accuracy and completeness, and I agree with the above.   [name], MD   5/22/20245:40 PM  CHIEF COMPLAINT:   Diagnosis: stage IIc (T4b N0) low rectal cancer    Cancer Staging  Rectal cancer [facility]) Staging form: Colon and Rectum, AJCC 8th Edition - Clinical stage from 01/03/2023: Stage IIC (cT4b, cN0, cM0) - Unsigned    Prior Therapy: none  Current Therapy:  FOLFIRINOX    HISTORY OF PRESENT ILLNESS:   Oncology History  Rectal cancer (HCC)  01/03/2023 Initial Diagnosis   Rectal cancer (HCC)   01/20/2023 -  Chemotherapy   Patient is on Treatment Plan : RECTAL Modified FOLFIRINOX q14d x 8 cycles        INTERVAL HISTORY:   [name] is a 67 y.o. female presenting to clinic today for follow up of stage IIc (T4b N0) low rectal cancer. She was last seen by me on 03/31/23. She was also seen by PA Rebekah on 04/05/23 in our symptom management clinic.  She has been receiving IVF, as well as potassium, following infusions. We referred her to the ED yesterday, 04/13/23, to receive additional fluids, given our infusion center was full.  Today, she states that she is doing well overall. Her appetite level is at 10%. Her energy level is at 10%.  PAST MEDICAL HISTORY:   Past Medical History: Past Medical History:  Diagnosis Date   Anxiety    Cancer (HCC)    Coronary atherosclerosis    Cardiac CT 09/2018 with calcium score 8.7 and mild proximal LAD disease   Essential hypertension 2009    GERD (gastroesophageal reflux disease)    Hypothyroidism    Iron deficiency anemia    Osteoarthritis    Palpitations    Tachypalpitations on beta blockers since 2010   Type 2 diabetes mellitus (HCC) 2003   Vitamin B 12 deficiency    Vitamin D deficiency     Surgical History: Past Surgical History:  Procedure Laterality Date   ABDOMINAL HYSTERECTOMY     History of cervical cancer   BIOPSY  12/16/2022   Procedure: BIOPSY;  Surgeon: Corbin Ade, MD;  [facility];  Service: Endoscopy;;  gastric, rectal   BREAST BIOPSY Right    COLONOSCOPY WITH PROPOFOL N/A 12/16/2022   Procedure: COLONOSCOPY WITH PROPOFOL;  Surgeon: Corbin Ade, MD;  [facility]: [facility];  Service: Endoscopy;  Laterality: N/A;  10:45 am   ESOPHAGOGASTRODUODENOSCOPY (EGD) WITH PROPOFOL N/A 12/16/2022   Procedure: ESOPHAGOGASTRODUODENOSCOPY (EGD) WITH PROPOFOL;  Surgeon: Corbin Ade, MD;  [facility];  Service: Endoscopy;  Laterality: N/A;   IR IMAGING GUIDED PORT INSERTION  01/15/2023   MALONEY DILATION N/A 12/16/2022   Procedure: Elease Hashimoto  DILATION;  Surgeon: Corbin Ade, MD;  Location: [facility];  Service: Endoscopy;  Laterality: N/A;   POLYPECTOMY  12/16/2022   Procedure: POLYPECTOMY;  Surgeon: Corbin Ade, MD;  [facility];  Service: Endoscopy;;   TONSILLECTOMY      Social History: Social History   Socioeconomic History   Marital status: Single    Spouse name: Not on file   Number of children: 2   Years of education: Not on file   Highest education level: Not on file  Occupational History   Occupation: DISABLED    Employer: UNEMPLOYED    Comment: [employer]  Tobacco Use   Smoking status: Never   Smokeless tobacco: Never  Vaping Use   Vaping Use: Never used  Substance and Sexual Activity   Alcohol use: No   Drug use: No   Sexual activity: Never  Other Topics Concern   Not on file  Social History Narrative   Has 2 grandchildren. 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   Social Determinants of Health   Financial Resource Strain: Not on file  Food Insecurity: No Food Insecurity (01/06/2023)   Hunger Vital Sign    Worried About Running Out of Food in the Last Year: Never true    Ran Out of Food in the Last Year: Never true  Transportation Needs: No Transportation Needs (01/06/2023)   PRAPARE - Administrator, Civil Service (Medical): No    Lack of Transportation (Non-Medical): No  Physical Activity: Not on file  Stress: Not on file  Social Connections: Not on file  Intimate Partner Violence: Not At Risk (01/06/2023)   Humiliation, Afraid, Rape, and Kick questionnaire    Fear of Current or Ex-Partner: No    Emotionally Abused: No    Physically Abused: No    Sexually Abused: No    Family History: Family History  Problem Relation Age of Onset   Heart failure Mother    Diabetes Father    Hypertension Father    Stroke Sister 17   Diabetes Brother    Cancer - Colon Other        age 69   Colon polyps Neg Hx     Current Medications:  Current Outpatient Medications:    blood glucose meter kit and supplies KIT, Dispense based on patient and insurance preference. Use up to four times daily as directed. (FOR ICD-10 E11.65) Number of Strips 400 Number of Lancets 400, Disp: 1 each, Rfl: 0   Blood Glucose Monitoring Suppl (TRUE METRIX METER) w/Device KIT, 1 each by Does not apply route 2 (two) times daily. Use to test BG bid E11.65, Disp: 1 kit, Rfl: 0   cyanocobalamin (VITAMIN B12) 1000 MCG/ML injection, Inject 1 mL (1,000 mcg total) into the muscle every 30 (thirty) days., Disp: 4 mL, Rfl: 3   dexamethasone (DECADRON) 2 MG tablet, Take 1 tablet (2 mg total) by mouth daily with breakfast., Disp: 10 tablet, Rfl: 1   diphenoxylate-atropine (LOMOTIL) 2.5-0.025 MG tablet, Take 1 tablet by mouth 4 (four) times daily. (Patient taking differently: Take 2 tablets by mouth 4 (four) times daily.), Disp:  360 tablet, Rfl: 3   DROPLET PEN NEEDLES 31G X 5 MM MISC, USE AS INSTRUCTED TO INJECT INSULIN DAILY, Disp: 100 each, Rfl: 3   FLUOROURACIL IV, Inject into the vein every 14 (fourteen) days., Disp: , Rfl:    folic acid (FOLVITE) 1 MG tablet, Take 1 tablet (1 mg  total) by mouth daily., Disp: 30 tablet, Rfl: 6   HYDROcodone-acetaminophen (NORCO) 5-325 MG tablet, Take 1 tablet by mouth every 6 (six) hours as needed for severe pain., Disp: 30 tablet, Rfl: 0   insulin aspart (NOVOLOG FLEXPEN) 100 UNIT/ML FlexPen, Inject 0-6 Units into the skin 3 (three) times daily with meals., Disp: 15 mL, Rfl: 0   IRINOTECAN HCL IV, Inject into the vein every 14 (fourteen) days., Disp: , Rfl:    LEUCOVORIN CALCIUM IV, Inject into the vein every 14 (fourteen) days., Disp: , Rfl:    levothyroxine (SYNTHROID) 88 MCG tablet, TAKE 1 TABLET EVERY DAY BEFORE BREAKFAST (Patient taking differently: Take 88 mcg by mouth daily before breakfast.), Disp: 90 tablet, Rfl: 3   lidocaine (XYLOCAINE) 2 % solution, SMARTSIG:By Mouth, Disp: , Rfl:    OLANZapine (ZYPREXA) 5 MG tablet, Take 2 tablets (10 mg total) by mouth at bedtime., Disp: 60 tablet, Rfl: 2   ondansetron (ZOFRAN-ODT) 4 MG disintegrating tablet, Take 1 tablet (4 mg total) by mouth every 8 (eight) hours as needed for nausea or vomiting. DISSOLVE 2 TAB ON TONGUE EVERY 8HR AS NEEDED NAUSEA, VOMITING (BETWEEN COMPAZINE DOSES FOR MAX RELIEF) Strength: 4 mg, Disp: 60 tablet, Rfl: 1   OXALIPLATIN IV, Inject into the vein every 14 (fourteen) days., Disp: , Rfl:    pantoprazole (PROTONIX) 40 MG tablet, Take 1 tablet (40 mg total) by mouth 2 (two) times daily before a meal., Disp: 60 tablet, Rfl: 3   Potassium Chloride 40 MEQ/15ML (20%) SOLN, Take 15 mLs by mouth 3 (three) times daily., Disp: 473 mL, Rfl: 0   prochlorperazine (COMPAZINE) 10 MG tablet, Take 1 tablet (10 mg total) by mouth every 8 (eight) hours as needed for nausea or vomiting. TAKE 1 TABLET EVERY 6 HOURS AS NEEDED FOR  NAUSEA OR VOMITING Strength: 10 mg, Disp: 30 tablet, Rfl: 3   scopolamine (TRANSDERM-SCOP) 1 MG/3DAYS, Place 1 patch (1.5 mg total) onto the skin every 3 (three) days., Disp: 10 patch, Rfl: 3   sucralfate (CARAFATE) 1 GM/10ML suspension, Take 10 mLs (1 g total) by mouth 4 (four) times daily. Swish and swallow, Disp: 420 mL, Rfl: 2   TRUE METRIX BLOOD GLUCOSE TEST test strip, TEST BLOOD SUGAR TWICE DAILY, Disp: 200 strip, Rfl: 3   FLUoxetine (PROZAC) 20 MG capsule, Take 1 capsule (20 mg total) by mouth daily., Disp: 30 capsule, Rfl: 2   loperamide (IMODIUM) 2 MG capsule, Take 1 capsule (2 mg total) by mouth as needed for diarrhea or loose stools. TAKE 1 CAPSULE AS DIRECTED AS NEEDED FOR DIARRHEA OR LOOSE STOOLS, Disp: 30 capsule, Rfl: 11 No current facility-administered medications for this visit.  Facility-Administered Medications Ordered in Other Visits:    0.9 %  sodium chloride infusion, , Intravenous, Continuous, [name], MD, Stopped at 02/17/23 1535   Allergies: Allergies  Allergen Reactions   Contrast Media [Iodinated Contrast Media] Other (See Comments)    Burning   Sulfa Antibiotics Nausea And Vomiting    Other reaction(s): GI Upset (intolerance) unknown   Doxepin Rash and Swelling   Tape Rash    REVIEW OF SYSTEMS:   Review of Systems  Constitutional:  Negative for chills, fatigue and fever.  HENT:   Negative for lump/mass, mouth sores, nosebleeds, sore throat and trouble swallowing.   Eyes:  Negative for eye problems.  Respiratory:  Negative for cough and shortness of breath.   Cardiovascular:  Negative for chest pain, leg swelling and palpitations.  Gastrointestinal:  Positive for diarrhea, nausea and vomiting. Negative for abdominal pain and constipation.  Genitourinary:  Negative for bladder incontinence, difficulty urinating, dysuria, frequency, hematuria and nocturia.   Musculoskeletal:  Negative for arthralgias, back pain, flank pain, myalgias and neck  pain.  Skin:  Negative for itching and rash.  Neurological:  Negative for dizziness, headaches and numbness.  Hematological:  Does not bruise/bleed easily.  Psychiatric/Behavioral:  Positive for sleep disturbance. Negative for depression and suicidal ideas. The patient is not nervous/anxious.   All other systems reviewed and are negative.    VITALS:   Pulse 100.  Wt Readings from Last 3 Encounters:  04/13/23 236 lb (107 kg)  04/05/23 236 lb 5.3 oz (107.2 kg)  03/31/23 236 lb 12.4 oz (107.4 kg)    There is no height or weight on file to calculate BMI.  Performance status (ECOG): 1 - Symptomatic but completely ambulatory  PHYSICAL EXAM:   Physical Exam Vitals and nursing note reviewed. Exam conducted with a chaperone present.  Constitutional:      Appearance: Normal appearance.  Cardiovascular:     Rate and Rhythm: Normal rate and regular rhythm.     Pulses: Normal pulses.     Heart sounds: Normal heart sounds.  Pulmonary:     Effort: Pulmonary effort is normal.     Breath sounds: Normal breath sounds.  Abdominal:     Palpations: Abdomen is soft. There is no hepatomegaly, splenomegaly or mass.     Tenderness: There is no abdominal tenderness.  Musculoskeletal:     Right lower leg: No edema.     Left lower leg: No edema.  Lymphadenopathy:     Cervical: No cervical adenopathy.     Right cervical: No superficial, deep or posterior cervical adenopathy.    Left cervical: No superficial, deep or posterior cervical adenopathy.     Upper Body:     Right upper body: No supraclavicular or axillary adenopathy.     Left upper body: No supraclavicular or axillary adenopathy.  Neurological:     General: No focal deficit present.     Mental Status: She is alert and oriented to person, place, and time.  Psychiatric:        Mood and Affect: Mood normal.        Behavior: Behavior normal.     LABS:      Latest Ref Rng & Units 04/13/2023    9:43 AM 04/09/2023    9:03 AM 04/07/2023    10:48 AM  CBC  WBC 4.0 - 10.5 K/uL 4.7  3.4  3.5   Hemoglobin 12.0 - 15.0 g/dL 9.5  8.7  9.0   Hematocrit 36.0 - 46.0 % 28.8  26.0  27.1   Platelets 150 - 400 K/uL 111  86  104       Latest Ref Rng & Units 04/14/2023    8:49 AM 04/13/2023    9:43 AM 04/09/2023    9:03 AM  CMP  Glucose 70 - 99 mg/dL 132  440  102   BUN 8 - 23 mg/dL 5  5  <5   Creatinine 7.25 - 1.00 mg/dL 3.66  4.40  3.47   Sodium 135 - 145 mmol/L 135  135  135   Potassium 3.5 - 5.1 mmol/L 3.1  3.0  2.9   Chloride 98 - 111 mmol/L 106  108  105   CO2 22 - 32 mmol/L 19  19  20    Calcium 8.9 - 10.3 mg/dL 8.0  8.3  8.3   Total Protein 6.5 - 8.1 g/dL 5.7  5.9  5.8   Total Bilirubin 0.3 - 1.2 mg/dL 0.8  0.5  1.0   Alkaline Phos 38 - 126 U/L 110  111  112   AST 15 - 41 U/L 23  24  36   ALT 0 - 44 U/L 11  14  17       Lab Results  Component Value Date   CEA1 3.8 12/16/2022   /  CEA  Date Value Ref Range Status  12/16/2022 3.8 0.0 - 4.7 ng/mL Final    Comment:    (NOTE)                             Nonsmokers          <3.9                             Smokers             <5.6 Roche [employer] Electrochemiluminescence Immunoassay (ECLIA) Values obtained with different assay methods or kits cannot be used interchangeably.  Results cannot be interpreted as absolute evidence of the presence or absence of malignant disease. Performed At: [facility] [address] [place], [place] [id number] Jolene Schimke MD JX:[id number]    No results found for: "PSA1" No results found for: "CAN199" No results found for: "CAN125"  No results found for: "TOTALPROTELP", "ALBUMINELP", "A1GS", "A2GS", "BETS", "BETA2SER", "GAMS", "MSPIKE", "SPEI" Lab Results  Component Value Date   TIBC 157 (L) 04/09/2023   TIBC 370 01/04/2023   FERRITIN 2,274 (H) 04/09/2023   FERRITIN 4 (L) 01/04/2023   IRONPCTSAT 57 (H) 04/09/2023   IRONPCTSAT 4 (L) 01/04/2023   Lab Results  Component Value Date   LDH 136 01/04/2023      STUDIES:   MR PELVIS WO CM RECTAL CA STAGING  Result Date: 04/14/2023 CLINICAL DATA:  Rectal cancer, status post chemotherapy and immunotherapy . EXAM: MRI PELVIS WITHOUT CONTRAST TECHNIQUE: Multiplanar multisequence MR imaging of the pelvis was performed. No intravenous contrast was administered. Ultrasound gel was administered per rectum to optimize tumor evaluation. COMPARISON:  12/24/2022 MRI.  PET of 12/31/2022 FINDINGS: TUMOR LOCATION Tumor distance from Anal Verge/Skin surface: 6.1 cm on 17/3 Tumor distance to Internal Anal sphincter: 2.4 cm on 15/6 TUMOR DESCRIPTION Circumferential extent: 180 degrees, centered about the 4 o'clock position on 19/11 Tumor Size and volume: 3.6 x 2.1 x 3.0 cm (volume = 12 cm^3)on 19/11 and 16/12. Compare 35 cc on the prior. T - CATEGORY Extension through Mus[employer]:T3c-at the 2 o'clock position, decreased macroscopic tumor extends to contact the mesorectal fascia including at 1.4 x 1.4 cm in 20/11. Decreased from 2.4 x 2.0 cm on the prior. Residual restricted diffusion including 924/10. Shortest Distance of any tumor/node from Mesorectal fascia: Not applicable. Contact again identified with the mesorectal fascia. Extramural Vascular Invasion/Tumor Thrombus: No Invasion of Anterior Peritoneal Reflection: No Involvement of Adjacent Organs or Pelvic Sidewall: Equivocal-decreased soft tissue extending towards the left vaginal apex and pelvic floor musculature including on 24/4. Levator Ani Involvement: No N - CATEGORY Mesorectal Lymph Nodes >=28mm: None=N0 Extra-mesorectal Lymphadenopathy: No Other: Poor flow laxity. Hysterectomy. No significant free fluid. Small fat containing right inguinal hernia. IMPRESSION: Rectal adenocarcinoma T stage: T3c Rectal adenocarcinoma N stage:  N0 Distance from tumor to the internal anal sphincter is 2.4 cm. Electronically Signed  By: Jeronimo Greaves M.D.   On: 04/14/2023 10:29

## 2023-04-13 NOTE — Discharge Instructions (Addendum)
1)Drink small, frequent amount of fluids.Marland KitchenMarland Kitchen  2)Please keep your appointment with Oncology office on 4th floor tomorrow --Wednesday 04/14/23 at 0830 am  3) your oncology office with talk to you about possibly adjusting your anti-nausea medications and your appetite stimulants, you probably need an antidepressant as well 4)Your  blood work including CBC and CMP blood test to be repeated tomorrow during your appointment on fourth floor

## 2023-04-13 NOTE — ED Notes (Signed)
See triage notes. Pt c/o weakness. Denies any pain at this time. Pt slightly pale. Non diaphoretic. A/o.

## 2023-04-14 ENCOUNTER — Inpatient Hospital Stay (HOSPITAL_BASED_OUTPATIENT_CLINIC_OR_DEPARTMENT_OTHER): Payer: Medicare HMO | Admitting: Hematology

## 2023-04-14 ENCOUNTER — Inpatient Hospital Stay: Payer: Medicare HMO

## 2023-04-14 VITALS — BP 105/86 | HR 108 | Temp 97.2°F | Resp 20

## 2023-04-14 VITALS — HR 100

## 2023-04-14 DIAGNOSIS — Z95828 Presence of other vascular implants and grafts: Secondary | ICD-10-CM

## 2023-04-14 DIAGNOSIS — T451X5A Adverse effect of antineoplastic and immunosuppressive drugs, initial encounter: Secondary | ICD-10-CM

## 2023-04-14 DIAGNOSIS — C20 Malignant neoplasm of rectum: Secondary | ICD-10-CM

## 2023-04-14 DIAGNOSIS — F419 Anxiety disorder, unspecified: Secondary | ICD-10-CM

## 2023-04-14 DIAGNOSIS — R64 Cachexia: Secondary | ICD-10-CM

## 2023-04-14 DIAGNOSIS — F32A Depression, unspecified: Secondary | ICD-10-CM

## 2023-04-14 DIAGNOSIS — K521 Toxic gastroenteritis and colitis: Secondary | ICD-10-CM

## 2023-04-14 DIAGNOSIS — Z5111 Encounter for antineoplastic chemotherapy: Secondary | ICD-10-CM | POA: Diagnosis not present

## 2023-04-14 DIAGNOSIS — R112 Nausea with vomiting, unspecified: Secondary | ICD-10-CM

## 2023-04-14 LAB — COMPREHENSIVE METABOLIC PANEL
ALT: 11 U/L (ref 0–44)
AST: 23 U/L (ref 15–41)
Albumin: 2.9 g/dL — ABNORMAL LOW (ref 3.5–5.0)
Alkaline Phosphatase: 110 U/L (ref 38–126)
Anion gap: 10 (ref 5–15)
BUN: 5 mg/dL — ABNORMAL LOW (ref 8–23)
CO2: 19 mmol/L — ABNORMAL LOW (ref 22–32)
Calcium: 8 mg/dL — ABNORMAL LOW (ref 8.9–10.3)
Chloride: 106 mmol/L (ref 98–111)
Creatinine, Ser: 0.83 mg/dL (ref 0.44–1.00)
GFR, Estimated: >60 mL/min (ref >60)
Glucose, Bld: 154 mg/dL — ABNORMAL HIGH (ref 70–99)
Potassium: 3.1 mmol/L — ABNORMAL LOW (ref 3.5–5.1)
Sodium: 135 mmol/L (ref 135–145)
Total Bilirubin: 0.8 mg/dL (ref 0.3–1.2)
Total Protein: 5.7 g/dL — ABNORMAL LOW (ref 6.5–8.1)

## 2023-04-14 LAB — MAGNESIUM: Magnesium: 1.9 mg/dL (ref 1.7–2.4)

## 2023-04-14 MED ORDER — HEPARIN SOD (PORK) LOCK FLUSH 100 UNIT/ML IV SOLN
500.0000 [IU] | Freq: Once | INTRAVENOUS | Status: AC
  Administered 2023-04-14: 500 [IU] via INTRAVENOUS

## 2023-04-14 MED ORDER — SODIUM CHLORIDE 0.9% FLUSH
10.0000 mL | Freq: Once | INTRAVENOUS | Status: AC
  Administered 2023-04-14: 10 mL via INTRAVENOUS

## 2023-04-14 MED ORDER — SCOPOLAMINE 1 MG/3DAYS TD PT72SCOPOLAMINE 1 MG/3DAYS
1.0000 | MEDICATED_PATCH | TRANSDERMAL | 3 refills | Status: DC
Start: 2023-04-14 — End: 2023-04-29

## 2023-04-14 MED ORDER — LOPERAMIDE HCL 2 MG PO CAPS
2.0000 mg | ORAL_CAPSULE | ORAL | 11 refills | Status: DC | PRN
Start: 2023-04-14 — End: 2023-10-11

## 2023-04-14 MED ORDER — DEXAMETHASONE 2 MG PO TABS
2.0000 mg | ORAL_TABLET | Freq: Every day | ORAL | 1 refills | Status: DC
Start: 2023-04-14 — End: 2023-04-28

## 2023-04-14 MED ORDER — CAPECITABINE 500 MG PO TABS
2000.0000 mg | ORAL_TABLET | Freq: Two times a day (BID) | ORAL | 0 refills | Status: DC
Start: 2023-04-14 — End: 2023-04-15
  Filled 2023-04-14: qty 240, 30d supply, fill #0

## 2023-04-14 MED ORDER — SODIUM CHLORIDE 0.9% FLUSH
10.0000 mL | INTRAVENOUS | Status: AC
  Administered 2023-04-14: 10 mL

## 2023-04-14 MED ORDER — FLUOXETINE HCL 20 MG PO CAPS
20.0000 mg | ORAL_CAPSULE | Freq: Every day | ORAL | 2 refills | Status: DC
Start: 2023-04-14 — End: 2023-04-28

## 2023-04-14 MED ORDER — CAPECITABINE 500 MG PO TABS
2000.0000 mg | ORAL_TABLET | Freq: Two times a day (BID) | ORAL | 0 refills | Status: DC
Start: 2023-04-14 — End: 2023-04-14

## 2023-04-14 NOTE — Addendum Note (Signed)
Addended by: [name] on: 04/14/2023 06:05 PM   Modules accepted: Orders

## 2023-04-14 NOTE — Progress Notes (Signed)
Message received from A. Dareen Piano RN / Dr . Ellin Saba. Discontinuing treatment due to good response on MRI. Patient to start XRT and Xeloda. Discharged from clinic by wheel chair in stable condition. Alert and oriented x 3. F/U with [facility] as scheduled.

## 2023-04-14 NOTE — Progress Notes (Signed)
Brief symptom management evaluation of [name] in conjunction with office visit with Dr. Ellin Saba today.  We discussed the following:  ANXIETY & DEPRESSION Patient had stopped taking her Prozac due to her own worry that it might interact with her chemotherapy. Recommend that patient RESTART her fluoxetine 20 mg daily. NAUSEA & VOMITING Patient is taking olanzapine 10 mg QHS, as well as alternating doses of Compazine and Zofran. Despite the above, she continues to have breakthrough nausea and vomiting. Prescription sent to pharmacy for [facility] Q 72 hours. We will also start her on dexamethasone 2 mg each morning with breakfast. LOW [employer] No improvement with Megace or increased dose of olanzapine.  Marinol unavailable due to Sport and exercise psychologist. We will start her on DEXAMETHASONE 2 MG each morning with breakfast. DIARRHEA Refill prescription for Imodium sent to pharmacy  Instructions for Patient: NAUSEA: Continue to take olanzapine 10 mg nightly and alternating doses of Compazine and Zofran. New prescription has been sent to your pharmacy for [facility].  Put this behind your ear and keep it there for 72 hours before replacing it with another patch. New prescription has been sent to your pharmacy for DEXAMETHASONE 2 MG.  Take this each morning with breakfast. LOW APPETITE: We have prescribed a steroid (dexamethasone 2 mg).  He will take this every morning with breakfast.  In addition to helping with nausea and vomiting, it will act as an appetite stimulant as well.  Carnella Guadalajara, PA-C  04/14/23 9:42 AM

## 2023-04-14 NOTE — Patient Instructions (Addendum)
[facility] at [facility] Discharge Instructions   You were seen and examined today by Dr. Ellin Saba.  He reviewed the results of your lab work which are normal/stable.   We will discontinue treatment with chemotherapy. We will plan to start radiation treatment. You will need to take a chemo pill during radiation treatments, and you take this only on the days of radiation treatments. This pill is called Xeloda and it will be shipped to your home from a specialty pharmacy.    We are sending a prescription for a steroid called dexamethasone. Take it daily in the morning. This will help with the nausea and may increase your appetite. We will send you a prescription for another anti-nausea medication called scopolamine. It is a patch you will wear behind your ear for 3 days, then change.   Return as scheduled.    Thank you for choosing [facility] at [facility] to provide your oncology and hematology care.  To afford each patient quality time with our provider, please arrive at least 15 minutes before your scheduled appointment time.   If you have a lab appointment with the Cancer Center please come in thru the Main Entrance and check in at the main information desk.  You need to re-schedule your appointment should you arrive 10 or more minutes late.  We strive to give you quality time with our providers, and arriving late affects you and other patients whose appointments are after yours.  Also, if you no show three or more times for appointments you may be dismissed from the clinic at the providers discretion.     Again, thank you for choosing [facility].  Our hope is that these requests will decrease the amount of time that you wait before being seen by our physicians.       _____________________________________________________________  Should you have questions after your visit to [facility], please contact our office at  [phone] and follow the prompts.  Our office hours are 8:00 a.m. and 4:30 p.m. Monday - Friday.  Please note that voicemails left after 4:00 p.m. may not be returned until the following business day.  We are closed weekends and major holidays.  You do have access to a nurse 24-7, just call the main number to the clinic [phone] and do not press any options, hold on the line and a nurse will answer the phone.    For prescription refill requests, have your pharmacy contact our office and allow 72 hours.    Due to Covid, you will need to wear a mask upon entering the hospital. If you do not have a mask, a mask will be given to you at the Main Entrance upon arrival. For doctor visits, patients may have 1 support person age 38 or older with them. For treatment visits, patients can not have anyone with them due to social distancing guidelines and our immunocompromised population.

## 2023-04-15 ENCOUNTER — Encounter: Payer: Self-pay | Admitting: Hematology

## 2023-04-15 ENCOUNTER — Other Ambulatory Visit: Payer: Self-pay

## 2023-04-15 ENCOUNTER — Other Ambulatory Visit (HOSPITAL_COMMUNITY): Payer: Self-pay

## 2023-04-15 ENCOUNTER — Telehealth: Payer: Self-pay

## 2023-04-15 ENCOUNTER — Telehealth: Payer: Self-pay | Admitting: Pharmacist

## 2023-04-15 DIAGNOSIS — C20 Malignant neoplasm of rectum: Secondary | ICD-10-CM

## 2023-04-15 MED ORDER — CAPECITABINE 500 MG PO TABS
2000.0000 mg | ORAL_TABLET | Freq: Two times a day (BID) | ORAL | 0 refills | Status: DC
Start: 2023-04-15 — End: 2023-04-15
  Filled 2023-04-15: qty 224, 28d supply, fill #0

## 2023-04-15 MED ORDER — CAPECITABINE 500 MG PO TABS
2000.0000 mg | ORAL_TABLET | Freq: Two times a day (BID) | ORAL | 0 refills | Status: DC
Start: 2023-04-15 — End: 2023-04-28
  Filled 2023-04-15 – 2023-04-26 (×2): qty 224, 28d supply, fill #0

## 2023-04-15 NOTE — Telephone Encounter (Signed)
Oral Oncology Patient Advocate Encounter  New authorization   Received notification that prior authorization for Capecitabine is required.   PA submitted on 04/15/23  Key [id number]  Status is pending     Ardeen Fillers, CPhT Oncology Pharmacy Patient Advocate  [facility]  [phone] (phone) [phone] (fax) 04/15/2023 8:38 AM

## 2023-04-15 NOTE — Telephone Encounter (Signed)
Oral Oncology Patient Advocate Encounter  Prior Authorization for Capecitabine has been approved.    PA# [id number]  Effective dates: 04/15/23 through 11/23/23  Patients co-pay is $0.00.    Ardeen Fillers, CPhT Oncology Pharmacy Patient Advocate  [facility]  [phone] (phone) [phone] (fax) 04/15/2023 9:50 AM

## 2023-04-15 NOTE — Telephone Encounter (Signed)
Oral Oncology Pharmacist Encounter  Received new prescription for Xeloda (capecitabine) for the treatment of stage IIc rectal cancer in conjunction with radiation, planned duration until the end of radiation.  Radiation start date not yet set, patient to see rad onc for consult on 04/22/23.  CMP from 04/14/23 assessed, no relevant lab abnormalities. Prescription dose and frequency assessed.   Current medication list in Epic reviewed, several DDIs with capecitabine identified: Pantoprazole: Proton Pump Inhibitors (PPI) may diminish the therapeutic effect of capecitabine, varying information on the clinical impact. Recommend evaluating the need for a PPI/acid suppression. If acid suppression is needed, attempt switching to a H2 antagonist (eg, famotidine) if possible. Folic acid: Folic Acid may enhance the adverse/toxic effect of Fluorouracil Products. Per Dr. Marice Potter note, they are having the patient take the folic acid for a folic acid deficiency. Monitor for increased fluorouracil toxicities (eg, diarrhea, mucositis/stomatitis, neutropenia). Olanzapine: QT-prolonging antipsychotics, such as olanzapine, may enhance the QTc-prolonging effect of fluorouracil products.   Evaluated chart and no patient barriers to medication adherence identified.   Prescription has been e-scribed to the [facility] for benefits analysis and approval.  Oral Oncology Clinic will continue to follow for insurance authorization, copayment issues, initial counseling and start date.   Remi Haggard, PharmD, BCPS, BCOP, CPP Hematology/Oncology Clinical Pharmacist Practitioner [place]/[facility] Oral Chemotherapy Navigation Clinic [phone]  04/15/2023 9:26 AM

## 2023-04-16 ENCOUNTER — Encounter: Payer: Self-pay | Admitting: Physician Assistant

## 2023-04-16 ENCOUNTER — Inpatient Hospital Stay: Payer: Medicare HMO

## 2023-04-20 ENCOUNTER — Telehealth: Payer: Self-pay | Admitting: *Deleted

## 2023-04-20 ENCOUNTER — Inpatient Hospital Stay: Payer: Medicare HMO

## 2023-04-20 DIAGNOSIS — C20 Malignant neoplasm of rectum: Secondary | ICD-10-CM

## 2023-04-20 DIAGNOSIS — D509 Iron deficiency anemia, unspecified: Secondary | ICD-10-CM

## 2023-04-20 DIAGNOSIS — Z5111 Encounter for antineoplastic chemotherapy: Secondary | ICD-10-CM | POA: Diagnosis not present

## 2023-04-20 LAB — CBC WITH DIFFERENTIAL/PLATELET
Abs Immature Granulocytes: 0.24 10*3/uL — ABNORMAL HIGH (ref 0.00–0.07)
Basophils Absolute: 0.1 10*3/uL (ref 0.0–0.1)
Basophils Relative: 1 %
Eosinophils Absolute: 0.1 10*3/uL (ref 0.0–0.5)
Eosinophils Relative: 1 %
HCT: 31.4 % — ABNORMAL LOW (ref 36.0–46.0)
Hemoglobin: 9.9 g/dL — ABNORMAL LOW (ref 12.0–15.0)
Immature Granulocytes: 4 %
Lymphocytes Relative: 23 %
Lymphs Abs: 1.5 10*3/uL (ref 0.7–4.0)
MCH: 29.6 pg (ref 26.0–34.0)
MCHC: 31.5 g/dL (ref 30.0–36.0)
MCV: 94 fL (ref 80.0–100.0)
Monocytes Absolute: 0.9 10*3/uL (ref 0.1–1.0)
Monocytes Relative: 14 %
Neutro Abs: 3.7 10*3/uL (ref 1.7–7.7)
Neutrophils Relative %: 57 %
Platelets: 177 10*3/uL (ref 150–400)
RBC: 3.34 MIL/uL — ABNORMAL LOW (ref 3.87–5.11)
RDW: 27.4 % — ABNORMAL HIGH (ref 11.5–15.5)
WBC: 6.4 10*3/uL (ref 4.0–10.5)
nRBC: 2 % — ABNORMAL HIGH (ref 0.0–0.2)

## 2023-04-20 LAB — COMPREHENSIVE METABOLIC PANEL
ALT: 16 U/L (ref 0–44)
AST: 35 U/L (ref 15–41)
Albumin: 2.7 g/dL — ABNORMAL LOW (ref 3.5–5.0)
Alkaline Phosphatase: 100 U/L (ref 38–126)
Anion gap: 13 (ref 5–15)
BUN: 9 mg/dL (ref 8–23)
CO2: 17 mmol/L — ABNORMAL LOW (ref 22–32)
Calcium: 8.4 mg/dL — ABNORMAL LOW (ref 8.9–10.3)
Chloride: 105 mmol/L (ref 98–111)
Creatinine, Ser: 0.81 mg/dL (ref 0.44–1.00)
GFR, Estimated: >60 mL/min (ref >60)
Glucose, Bld: 132 mg/dL — ABNORMAL HIGH (ref 70–99)
Potassium: 3.6 mmol/L (ref 3.5–5.1)
Sodium: 135 mmol/L (ref 135–145)
Total Bilirubin: 0.9 mg/dL (ref 0.3–1.2)
Total Protein: 5.6 g/dL — ABNORMAL LOW (ref 6.5–8.1)

## 2023-04-20 LAB — SAMPLE TO BLOOD BANK

## 2023-04-20 LAB — MAGNESIUM: Magnesium: 1.5 mg/dL — ABNORMAL LOW (ref 1.7–2.4)

## 2023-04-20 MED ORDER — SODIUM CHLORIDE 0.9% FLUSH
10.0000 mL | Freq: Once | INTRAVENOUS | Status: AC | PRN
  Administered 2023-04-20: 10 mL

## 2023-04-20 MED ORDER — POTASSIUM CHLORIDE IN NACL 20-0.9 MEQ/L-% IV SOLN
Freq: Once | INTRAVENOUS | Status: AC
  Filled 2023-04-20: qty 1000

## 2023-04-20 MED ORDER — MAGNESIUM SULFATE 2 GM/50ML IV SOLN
2.0000 g | Freq: Once | INTRAVENOUS | Status: AC
  Administered 2023-04-20: 2 g via INTRAVENOUS
  Filled 2023-04-20: qty 50

## 2023-04-20 MED ORDER — HEPARIN SOD (PORK) LOCK FLUSH 100 UNIT/ML IV SOLN
500.0000 [IU] | Freq: Once | INTRAVENOUS | Status: AC | PRN
  Administered 2023-04-20: 500 [IU]

## 2023-04-20 NOTE — Telephone Encounter (Signed)
Patient called stating that she needs fluids.  She is nauseous and has not been eating or drinking properly.  Will add to schedule for PF lab and fluids today.

## 2023-04-20 NOTE — Patient Instructions (Signed)
[facility] [place]  Discharge Instructions: Thank you for choosing [facility] to provide your oncology and hematology care.  If you have a lab appointment with the Cancer Center - please note that after April 8th, 2024, all labs will be drawn in the cancer center.  You do not have to check in or register with the main entrance as you have in the past but will complete your check-in in the cancer center.  Wear comfortable clothing and clothing appropriate for easy access to any Portacath or PICC line.   We strive to give you quality time with your provider. You may need to reschedule your appointment if you arrive late (15 or more minutes).  Arriving late affects you and other patients whose appointments are after yours.  Also, if you miss three or more appointments without notifying the office, you may be dismissed from the clinic at the provider's discretion.      For prescription refill requests, have your pharmacy contact our office and allow 72 hours for refills to be completed.    Today you received house fluids     BELOW ARE SYMPTOMS THAT SHOULD BE REPORTED IMMEDIATELY: *FEVER GREATER THAN 100.4 F (38 C) OR HIGHER *CHILLS OR SWEATING *NAUSEA AND VOMITING THAT IS NOT CONTROLLED WITH YOUR NAUSEA MEDICATION *UNUSUAL SHORTNESS OF BREATH *UNUSUAL BRUISING OR BLEEDING *URINARY PROBLEMS (pain or burning when urinating, or frequent urination) *BOWEL PROBLEMS (unusual diarrhea, constipation, pain near the anus) TENDERNESS IN MOUTH AND THROAT WITH OR WITHOUT PRESENCE OF ULCERS (sore throat, sores in mouth, or a toothache) UNUSUAL RASH, SWELLING OR PAIN  UNUSUAL VAGINAL DISCHARGE OR ITCHING   Items with * indicate a potential emergency and should be followed up as soon as possible or go to the Emergency Department if any problems should occur.  Please show the CHEMOTHERAPY ALERT CARD or IMMUNOTHERAPY ALERT CARD at check-in to the Emergency Department and triage  nurse.  Should you have questions after your visit or need to cancel or reschedule your appointment, please contact [facility] [place] [phone]  and follow the prompts.  Office hours are 8:00 a.m. to 4:30 p.m. Monday - Friday. Please note that voicemails left after 4:00 p.m. may not be returned until the following business day.  We are closed weekends and major holidays. You have access to a nurse at all times for urgent questions. Please call the main number to the clinic [phone] and follow the prompts.  For any non-urgent questions, you may also contact your provider using MyChart. We now offer e-Visits for anyone 18 and older to request care online for non-urgent symptoms. For details visit [email or web address].   Also download the MyChart app! Go to the app store, search "MyChart", open the app, select , and log in with your MyChart username and password.   

## 2023-04-20 NOTE — Progress Notes (Signed)
Patient presents today for labs and fluids today per provider's order. Pt called into the clinic stating she felt nauseous and has not been eating much and stated she felt like she needed fluids Dr.K made aware.  House fluids given today per MD orders. Tolerated infusion without adverse affects. Vital signs stable. No complaints at this time. Discharged from clinic via wheelchair in stable condition. Alert and oriented x 3. F/U with [facility] as scheduled.

## 2023-04-22 ENCOUNTER — Encounter: Payer: Self-pay | Admitting: Radiation Oncology

## 2023-04-22 ENCOUNTER — Telehealth: Payer: Self-pay | Admitting: Dietician

## 2023-04-22 ENCOUNTER — Ambulatory Visit
Admission: RE | Admit: 2023-04-22 | Discharge: 2023-04-22 | Disposition: A | Discharge status: Home / Self Care | Payer: Medicare HMO | Source: Ambulatory Visit | Attending: Radiation Oncology | Admitting: Radiation Oncology

## 2023-04-22 ENCOUNTER — Inpatient Hospital Stay: Payer: Medicare HMO | Admitting: Dietician

## 2023-04-22 VITALS — BP 101/72 | HR 108 | Temp 97.0°F | Resp 18 | Ht 67.0 in | Wt 227.6 lb

## 2023-04-22 DIAGNOSIS — Z79899 Other long term (current) drug therapy: Secondary | ICD-10-CM | POA: Insufficient documentation

## 2023-04-22 DIAGNOSIS — R630 Anorexia: Secondary | ICD-10-CM | POA: Insufficient documentation

## 2023-04-22 DIAGNOSIS — I10 Essential (primary) hypertension: Secondary | ICD-10-CM | POA: Diagnosis not present

## 2023-04-22 DIAGNOSIS — M1990 Unspecified osteoarthritis, unspecified site: Secondary | ICD-10-CM | POA: Insufficient documentation

## 2023-04-22 DIAGNOSIS — C20 Malignant neoplasm of rectum: Secondary | ICD-10-CM | POA: Insufficient documentation

## 2023-04-22 DIAGNOSIS — E039 Hypothyroidism, unspecified: Secondary | ICD-10-CM | POA: Diagnosis not present

## 2023-04-22 DIAGNOSIS — E559 Vitamin D deficiency, unspecified: Secondary | ICD-10-CM | POA: Insufficient documentation

## 2023-04-22 DIAGNOSIS — E538 Deficiency of other specified B group vitamins: Secondary | ICD-10-CM | POA: Insufficient documentation

## 2023-04-22 DIAGNOSIS — K219 Gastro-esophageal reflux disease without esophagitis: Secondary | ICD-10-CM | POA: Diagnosis not present

## 2023-04-22 DIAGNOSIS — Z800 Family history of malignant neoplasm of digestive organs: Secondary | ICD-10-CM | POA: Diagnosis not present

## 2023-04-22 DIAGNOSIS — Z79890 Hormone replacement therapy (postmenopausal): Secondary | ICD-10-CM | POA: Diagnosis not present

## 2023-04-22 DIAGNOSIS — E119 Type 2 diabetes mellitus without complications: Secondary | ICD-10-CM | POA: Insufficient documentation

## 2023-04-22 DIAGNOSIS — I2510 Atherosclerotic heart disease of native coronary artery without angina pectoris: Secondary | ICD-10-CM | POA: Insufficient documentation

## 2023-04-22 DIAGNOSIS — Z7952 Long term (current) use of systemic steroids: Secondary | ICD-10-CM | POA: Diagnosis not present

## 2023-04-22 DIAGNOSIS — Z794 Long term (current) use of insulin: Secondary | ICD-10-CM | POA: Diagnosis not present

## 2023-04-22 DIAGNOSIS — D509 Iron deficiency anemia, unspecified: Secondary | ICD-10-CM | POA: Diagnosis not present

## 2023-04-22 NOTE — Progress Notes (Addendum)
Nursing interview for a 67 yr old female w/ Rectal cancer (HCC).  Patient identity verified x2.  Patient reports doing well. Denies any related discomfort at this time.   Meaningful use complete  Vitals- BP 101/72 (BP Location: Left Arm, Patient Position: Sitting, Cuff Size: Normal)   Pulse (!) 108   Temp (!) 97 F (36.1 C) (Temporal)   Resp 18   Ht 5\' 7"  (1.702 m)   Wt 227 lb 9.6 oz (103.2 kg)   SpO2 98%   BMI 35.65 kg/m    This concludes the interview.   Ruel Favors, LPN
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
## 2023-04-22 NOTE — Progress Notes (Signed)
Radiation Oncology         [phone] ________________________________  Name: [name]        MRN: [id number]  Date of Service: 04/22/2023 DOB: 10-May-1956  DG:LOVFI, Nira Retort, MD  [name], MD     REFERRING PHYSICIAN: [name], MD   DIAGNOSIS: The encounter diagnosis was Rectal cancer [facility]).   HISTORY OF PRESENT ILLNESS: [name] is a 67 y.o. female with a diagnosis of rectal cancer.  She originally presented wtih episodes of rectal bleeding that she attributed to internal hemorrhoids. She underwent her first ever colonoscopy on 12/16/2022 with Dr. Jena Gauss.  A large palpable tumor at the distal aspect of the rectum was noted and could be visibly seen externally with compression against the perirectal tissue from the outside.  There was stigmata of bleeding and the lesion was bulky measuring up to 13 cm.  Biopsies were performed and consistent with adenocarcinoma.  She underwent staging CT chest abdomen pelvis on 12/17/2022 which showed what appeared to be advanced rectal wall thickening but no evidence of adenopathy, solitary right middle lobe nodule was noted and stigmata of cirrhosis and portal hypertension and a complex right renal cyst were noted.  Stigmata also of atherosclerotic and cholelithiasis and panniculitis were seen as well.   MRI of the pelvis on 12/24/2022 with rectal cancer staging felt to be between T3d and T4 disease involving the muscularis and suspicious for involving the pelvic floor musculature and left side of the vagina.  She did not have any involved lymph nodes.  A PET scan on 12/31/2022 showed hypermetabolic activity in the rectal lesion but no perirectal or pelvic adenopathy or evidence of metastatic disease.  There appeared to be some uptake in the region of the gallbladder neck.     Dr. Ellin Saba recommended total neoadjuvant chemotherapy with FOLFIRINOX and began on 01/20/2023 and completed 6 cycles on 03/31/2023. She struggled with tolerance  and this was discontinued. Repeat MRI pelvis on 04/12/23 showed reduction in tumor volume and the tumor was re-classified as T3cN0.  She is seen to discuss chemoradiation.  She has established with Dr. Maisie Fus and colorectal surgery as well.    PREVIOUS RADIATION THERAPY: No   PAST MEDICAL HISTORY:  Past Medical History:  Diagnosis Date   Anxiety    Cancer (HCC)    Coronary atherosclerosis    Cardiac CT 09/2018 with calcium score 8.7 and mild proximal LAD disease   Essential hypertension 2009   GERD (gastroesophageal reflux disease)    Hypothyroidism    Iron deficiency anemia    Osteoarthritis    Palpitations    Tachypalpitations on beta blockers since 2010   Type 2 diabetes mellitus (HCC) 2003   Vitamin B 12 deficiency    Vitamin D deficiency        PAST SURGICAL HISTORY: Past Surgical History:  Procedure Laterality Date   ABDOMINAL HYSTERECTOMY     History of cervical cancer   BIOPSY  12/16/2022   Procedure: BIOPSY;  Surgeon: Corbin Ade, MD;  [facility];  Service: Endoscopy;;  gastric, rectal   BREAST BIOPSY Right    COLONOSCOPY WITH PROPOFOL N/A 12/16/2022   Procedure: COLONOSCOPY WITH PROPOFOL;  Surgeon: Corbin Ade, MD;  Location: [facility];  Service: Endoscopy;  Laterality: N/A;  10:45 am   ESOPHAGOGASTRODUODENOSCOPY (EGD) WITH PROPOFOL N/A 12/16/2022   Procedure: ESOPHAGOGASTRODUODENOSCOPY (EGD) WITH PROPOFOL;  Surgeon: Corbin Ade, MD;  Location: [facility];  Service: Endoscopy;  Laterality: N/A;   IR IMAGING GUIDED PORT INSERTION  01/15/2023   MALONEY DILATION N/A 12/16/2022   Procedure: Elease Hashimoto DILATION;  Surgeon: Corbin Ade, MD;  Location: [facility];  Service: Endoscopy;  Laterality: N/A;   POLYPECTOMY  12/16/2022   Procedure: POLYPECTOMY;  Surgeon: Corbin Ade, MD;  [facility];  Service: Endoscopy;;   TONSILLECTOMY       FAMILY HISTORY:  Family History  Problem Relation Age of Onset   Heart failure  Mother    Diabetes Father    Hypertension Father    Stroke Sister 42   Diabetes Brother    Cancer - Colon Other        age 81   Colon polyps Neg Hx      SOCIAL HISTORY:  reports that she has never smoked. She has never used smokeless tobacco. She reports that she does not drink alcohol and does not use drugs. The patient is single and lives in [place]. She's accompanied by her daughter.    ALLERGIES: Contrast media [iodinated contrast media], Sulfa antibiotics, Doxepin, and Tape   MEDICATIONS:  Current Outpatient Medications  Medication Sig Dispense Refill   blood glucose meter kit and supplies KIT Dispense based on patient and insurance preference. Use up to four times daily as directed. (FOR ICD-10 E11.65) Number of Strips 400 Number of Lancets 400 1 each 0   Blood Glucose Monitoring Suppl (TRUE METRIX METER) w/Device KIT 1 each by Does not apply route 2 (two) times daily. Use to test BG bid E11.65 1 kit 0   capecitabine (XELODA) 500 MG tablet Take 4 tablets (2,000 mg total) by mouth 2 (two) times daily after a meal. Take Monday- Friday, Take only on days of radiation. 224 tablet 0   cyanocobalamin (VITAMIN B12) 1000 MCG/ML injection Inject 1 mL (1,000 mcg total) into the muscle every 30 (thirty) days. 4 mL 3   dexamethasone (DECADRON) 2 MG tablet Take 1 tablet (2 mg total) by mouth daily with breakfast. 10 tablet 1   diphenoxylate-atropine (LOMOTIL) 2.5-0.025 MG tablet Take 1 tablet by mouth 4 (four) times daily. (Patient taking differently: Take 2 tablets by mouth 4 (four) times daily.) 360 tablet 3   DROPLET PEN NEEDLES 31G X 5 MM MISC USE AS INSTRUCTED TO INJECT INSULIN DAILY 100 each 3   FLUOROURACIL IV Inject into the vein every 14 (fourteen) days.     FLUoxetine (PROZAC) 20 MG capsule Take 1 capsule (20 mg total) by mouth daily. 30 capsule 2   folic acid (FOLVITE) 1 MG tablet Take 1 tablet (1 mg total) by mouth daily. 30 tablet 6   HYDROcodone-acetaminophen (NORCO) 5-325  MG tablet Take 1 tablet by mouth every 6 (six) hours as needed for severe pain. 30 tablet 0   insulin aspart (NOVOLOG FLEXPEN) 100 UNIT/ML FlexPen Inject 0-6 Units into the skin 3 (three) times daily with meals. 15 mL 0   IRINOTECAN HCL IV Inject into the vein every 14 (fourteen) days.     LEUCOVORIN CALCIUM IV Inject into the vein every 14 (fourteen) days.     levothyroxine (SYNTHROID) 88 MCG tablet TAKE 1 TABLET EVERY DAY BEFORE BREAKFAST (Patient taking differently: Take 88 mcg by mouth daily before breakfast.) 90 tablet 3   lidocaine (XYLOCAINE) 2 % solution SMARTSIG:By Mouth     loperamide (IMODIUM) 2 MG capsule Take 1 capsule (2 mg total) by mouth as needed for diarrhea or loose stools. TAKE 1 CAPSULE AS DIRECTED AS  NEEDED FOR DIARRHEA OR LOOSE STOOLS 30 capsule 11   OLANZapine (ZYPREXA) 5 MG tablet Take 2 tablets (10 mg total) by mouth at bedtime. 60 tablet 2   ondansetron (ZOFRAN-ODT) 4 MG disintegrating tablet Take 1 tablet (4 mg total) by mouth every 8 (eight) hours as needed for nausea or vomiting. DISSOLVE 2 TAB ON TONGUE EVERY 8HR AS NEEDED NAUSEA, VOMITING (BETWEEN COMPAZINE DOSES FOR MAX RELIEF) Strength: 4 mg 60 tablet 1   OXALIPLATIN IV Inject into the vein every 14 (fourteen) days.     pantoprazole (PROTONIX) 40 MG tablet Take 1 tablet (40 mg total) by mouth 2 (two) times daily before a meal. 60 tablet 3   Potassium Chloride 40 MEQ/15ML (20%) SOLN Take 15 mLs by mouth 3 (three) times daily. 473 mL 0   prochlorperazine (COMPAZINE) 10 MG tablet Take 1 tablet (10 mg total) by mouth every 8 (eight) hours as needed for nausea or vomiting. TAKE 1 TABLET EVERY 6 HOURS AS NEEDED FOR NAUSEA OR VOMITING Strength: 10 mg 30 tablet 3   scopolamine (TRANSDERM-SCOP) 1 MG/3DAYS Place 1 patch (1.5 mg total) onto the skin every 3 (three) days. 10 patch 3   sucralfate (CARAFATE) 1 GM/10ML suspension Take 10 mLs (1 g total) by mouth 4 (four) times daily. Swish and swallow 420 mL 2   TRUE METRIX BLOOD  GLUCOSE TEST test strip TEST BLOOD SUGAR TWICE DAILY 200 strip 3   No current facility-administered medications for this encounter.   Facility-Administered Medications Ordered in Other Encounters  Medication Dose Route Frequency Provider Last Rate Last Admin   0.9 %  sodium chloride infusion   Intravenous Continuous [name], MD   Stopped at 02/17/23 1535     REVIEW OF SYSTEMS: On review of systems, the patient is struggling with fatigue, poor appetite, and poor taste since chemotherapy. She has also had some regurgitation when eating and plans to see GI for this soon. She reports no rectal bleeding, and soft stools with some mucous per rectum. No other complaints are verbalized.   PHYSICAL EXAM:  Wt Readings from Last 3 Encounters:  04/22/23 227 lb 9.6 oz (103.2 kg)  04/13/23 236 lb (107 kg)  04/05/23 236 lb 5.3 oz (107.2 kg)   Temp Readings from Last 3 Encounters:  04/22/23 (!) 97 F (36.1 C) (Temporal)  04/20/23 (!) 96.2 F (35.7 C) (Tympanic)  04/14/23 (!) 97.2 F (36.2 C) (Tympanic)   BP Readings from Last 3 Encounters:  04/22/23 101/72  04/20/23 112/89  04/14/23 105/86   Pulse Readings from Last 3 Encounters:  04/22/23 (!) 108  04/20/23 100  04/14/23 100   Pain Assessment Pain Score: 0-No pain/10  In general this is a tired appearing Caucasian female in no acute distress. She's alert and oriented x4 and appropriate throughout the examination. Cardiopulmonary assessment is negative for acute distress and she exhibits normal effort.     ECOG = 1  0 - Asymptomatic (Fully active, able to carry on all predisease activities without restriction)  1 - Symptomatic but completely ambulatory (Restricted in physically strenuous activity but ambulatory and able to carry out work of a light or sedentary nature. For example, light housework, office work)  2 - Symptomatic, <50% in bed during the day (Ambulatory and capable of all self care but unable to carry out  any work activities. Up and about more than 50% of waking hours)  3 - Symptomatic, >50% in bed, but not bedbound (Capable of only limited self-care, confined  to bed or chair 50% or more of waking hours)  4 - Bedbound (Completely disabled. Cannot carry on any self-care. Totally confined to bed or chair)  5 - Death   Santiago Glad MM, Creech RH, Tormey DC, et al. [phone]). "Toxicity and response criteria of the [facility]". Am. Evlyn Clines. Oncol. 5 (6): 649-55     IMPRESSION/PLAN: 1. 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 We discussed the patient's course and reviewed the rationale for proceeding with chemoradiation now that her systemic therapy has concluded.  We discussed the risks, benefits, short, and long term effects of radiotherapy, as well as the curative intent, and the patient is interested in proceeding. Dr. Mitzi Hansen discusses the delivery and logistics of radiotherapy and anticipates a course of 5 1/2 weeks of radiotherapy to the rectum and pelvis. Written consent is obtained and placed in the chart, a copy was provided to the patient. She will simulate next week and we anticipate starting treatment on 05/10/23. 2. Risks of pelvic floor dysfunction from radiotherapy. We discussed the importance of evaluation with physical therapy prior to pelvic radiation since she did not meet in their clinic despite our referral. She declined this and will let us know if she changes her mind. 3. Poor appetite, weight loss, and regurgitation. She will meet with GI next week. We appreciate their input regarding her care.    In a visit lasting 45 minutes, greater than 50% of the time was spent face to face discussing the patient's condition, in preparation for the discussion, and coordinating the patient's care.       Osker Mason, [facility]   **Disclaimer: This note was dictated with voice  recognition software. Similar sounding words can inadvertently be transcribed and this note may contain transcription errors which may not have been corrected upon publication of note.**

## 2023-04-26 ENCOUNTER — Other Ambulatory Visit: Payer: Self-pay

## 2023-04-26 ENCOUNTER — Ambulatory Visit: Payer: Medicare HMO | Admitting: Nurse Practitioner

## 2023-04-26 ENCOUNTER — Other Ambulatory Visit (HOSPITAL_COMMUNITY): Payer: Self-pay

## 2023-04-26 NOTE — Telephone Encounter (Signed)
Oral Chemotherapy Pharmacist Encounter  [facility] (Specialty) will deliver medication to patient on 6/5, she knows not to start until her first day of radiation.   Patient Education I spoke with patient for overview of new oral chemotherapy medication: Xeloda (capecitabine) for the treatment of stage IIc rectal cancer in conjunction with radiation, planned duration until the end of radiation.   Counseled patient on administration, dosing, side effects, monitoring, drug-food interactions, safe handling, storage, and disposal. Patient will take 4 tablets (2,000 mg total) by mouth 2 (two) times daily after a meal. Take Monday- Friday, Take only on days of radiation.   Side effects include but not limited to: diarrhea, hand-foot syndrome, mouth sores, edema, decreased wbc, fatigue, N/V Diarrhea: Patient knows to use loperamide as needed and report if she is having 4 ore more loose stools Hand-foot syndrome: Recommended the use of Udderly Smooth Extra Care 20. She knows to report skins changes she notices on her hand or feet Mouth sores: if needed patient will call to request magic mouthwash  Reviewed with patient importance of keeping a medication schedule and plan for any missed doses.  After discussion with patient no patient barriers to medication adherence identified.   Ms. Trujeque voiced understanding and appreciation. All questions answered. Medication handout provided.  Provided patient with Oral Chemotherapy Navigation Clinic phone number. Patient knows to call the office with questions or concerns. Oral Chemotherapy Navigation Clinic will continue to follow.  Remi Haggard, PharmD, BCPS, BCOP, CPP Hematology/Oncology Clinical Pharmacist Practitioner /DB/AP Oral Chemotherapy Navigation Clinic [phone]  04/26/2023 1:57 PM

## 2023-04-26 NOTE — Telephone Encounter (Signed)
Patient successfully OnBoarded and drug education provided by pharmacist. Medication scheduled to be shipped on 04/27/23 for delivery on 04/28/23 from [facility] to patient's address. Patient also knows to call me at [phone] with any questions or concerns regarding delivery.    Ardeen Fillers, CPhT Oncology Pharmacy Patient Advocate  [facility]  [phone] (phone) [phone] (fax) 04/26/2023 2:02 PM

## 2023-04-28 ENCOUNTER — Other Ambulatory Visit: Payer: Self-pay

## 2023-04-28 ENCOUNTER — Inpatient Hospital Stay: Payer: Medicare HMO

## 2023-04-28 ENCOUNTER — Encounter: Payer: Self-pay | Admitting: Gastroenterology

## 2023-04-28 ENCOUNTER — Ambulatory Visit (INDEPENDENT_AMBULATORY_CARE_PROVIDER_SITE_OTHER): Payer: Medicare HMO | Admitting: Gastroenterology

## 2023-04-28 ENCOUNTER — Inpatient Hospital Stay: Payer: Medicare HMO | Admitting: Hematology

## 2023-04-28 VITALS — BP 91/63 | HR 104 | Ht 67.0 in | Wt 222.8 lb

## 2023-04-28 DIAGNOSIS — C20 Malignant neoplasm of rectum: Secondary | ICD-10-CM

## 2023-04-28 DIAGNOSIS — K219 Gastro-esophageal reflux disease without esophagitis: Secondary | ICD-10-CM

## 2023-04-28 DIAGNOSIS — K6289 Other specified diseases of anus and rectum: Secondary | ICD-10-CM

## 2023-04-28 DIAGNOSIS — K7460 Unspecified cirrhosis of liver: Secondary | ICD-10-CM

## 2023-04-28 DIAGNOSIS — D509 Iron deficiency anemia, unspecified: Secondary | ICD-10-CM

## 2023-04-28 NOTE — Progress Notes (Signed)
GI Office Note    Referring Provider: Gwenlyn Found, MD Primary Care Physician:  Gwenlyn Found, MD Primary Gastroenterologist: Gerrit Friends.Rourk, MD  Date:  04/28/2023  ID:  [name], DOB 1956/09/11, MRN [id number]  Chief Complaint   Chief Complaint  Patient presents with   Gastroesophageal Reflux    Follow up on GERD. Reports symptoms are controlled with meds.   Diarrhea    Follow up on diarrhea. Reports better. Has about two episodes a day.    History of Present Illness  [name] is a 67 y.o. female with a history of IDDM with diabetic peripheral neuropathy, RAI induced hypothyroidism, HTN, HLD, aortic valve disease, CAD, GERD, IDA, cirrhosis, and recently diagnosed rectal cancer following with Dr. Kirtland Bouchard currently receiving chemo presenting today for follow-up.  Labs June 2022: Hemoglobin 7.8, MCV 63.2, platelets 167, iron 17, ferritin 3, saturation 4%, B12 216   Labs March 2023: Iron 28, ferritin 9, saturation 7%, B12 859   Labs April 2023: Hemoglobin 10.7, MCV 72.7, platelets unable to verify due to clumping.     Per referral paperwork received from PCP patient had positive Cologuard test in November 2019.  Last office visit note 10/09/2022 with PCP.  Etiology of IDA unknown, history of positive Cologuard, unable to complete colonoscopy due to transportation issue.  Noted to have GERD that was controlled on PPI.  Previous symptoms were dizziness and fatigue as well as dyspnea improved after starting oral iron 1 year prior.  Complained of new dizziness, fatigue, dyspnea over the last few days.  Noted to have a low hemoglobin of 7 in late 2022.  History of Rehman exam, taking magnesium oxide 400 mg nightly to help with leg cramps.  On pravastatin for HLD, strong family history of ASCVD.  Cardiac CT in 2019 with calcium score 8.7 and mild proximal LAD disease.  Taking Synthroid daily.  On Lantus 60 units nightly and NovoLog 8 to 14 units 3 times daily before meals, Mounjaro, and  metformin.  CBC and iron panel ordered as well as referral to GI.  Patient stated she was ready for colonoscopy.  Advised to continue PPI and consider upper endoscopy as well.  Previously with B12 deficiency, not on supplementation.  Plan to recheck.   Labs 10/09/2022: Iron 20, ferritin 8, saturation 5%, hemoglobin 9.6, MCV 77.9, platelets 151, B12 420   Office visit 11/26/2022.  She reported ongoing symptoms of GERD, not having good appetite and was having early satiety.  Denies any weight loss.  Reported chronic use of PPI since 2009 and was having some dysphagia that was worsening over the last 3 to 4 months.  Denied any nausea/vomiting.  Reported drinking a lot of water.  Has been on oral iron once daily for couple of years and previously was on B12 injections that she had recently restarted this past summer.  Has had some periods of shortness of breath and has constant fatigue.  Was having some BRBPR usually with stools.  Denies any constipation.  Has not needed any stool softener in years.  Denies any melena.  Did admit to occasional palpitations but denied any chest pain.  CBC was checked.  Advised to continue iron and PPI.  Scheduled for upper endoscopy with possible dilation and colonoscopy with Dr. Jena Gauss.  Advised may need to consider hematology referral for IV iron.   EGD 12/16/2022: -Normal esophagus s/p dilation -Abnormal appearing stomach of uncertain significance s/p biopsy -Duodenal diverticulum -Stomach biopsy with mild  chronic gastritis with lymphoid aggregate negative for H. pylori   Colonoscopy 12/16/2022: -Tumor protruding through anal orifice, bulky semilunar tumor extending from the anorectal junction approximately 13 cm s/p multiple biopsies -Left-sided diverticula -Redundant colon -4 ascending colon polyps removed -Polyps removed to be tubular adenomas -Rectal tumor revealed to be tubulovillous adenoma with at least high-grade dysplasia -Advised to check Chem-12, CBC, CEA as  well as pelvic abdominal and chest CT in the near future.   CT chest, abdomen, and pelvis 12/17/2022: -Locally advanced low rectal primary without bowel obstruction or nodule metastasis -Isolated right middle lobe subpleural pulmonary nodule most likely benign recommend attention on follow-up -Cirrhosis and portal venous hypertension with several tiny low-density foci within the liver most likely degenerative nodules no evidence of hepatic metastasis -Complex 2.6 cm right renal lesion could represent a hemorrhagic/proteinaceous cyst or solid neoplasm which could be reevaluated with pre and postcontrast abdominal imaging with follow-up CT -Right-sided thyroid/isthmic nodule of 1.8 cm recommend thyroid ultrasound -Cholelithiasis   Patient underwent pelvic MRI 12/24/2022 revealing rectal adenocarcinoma stage T3d, 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  Distance from tumor to i internal sphincter is 2.8 cm   PET scan 12/31/2022 with no rectal lesions without hypermetabolic perirectal or pelvic lymphadenopathy -Subtle too small to characterize hypoattenuating lesions in the hepatic dome previous CT without any detectable hypermetabolism at -2.3 cm inferior right thyroid nodule without hypermetabolism -2.7 cm exophytic posterior right interpolar renal lesion without hypermetabolism likely a cyst complicated by proteinaceous debris or hemorrhage -Focal hypermetabolism in the region of the gallbladder neck that corresponds to 13 mm focus of increased attenuation in the lumen of the gallbladder.  Given FDG accumulation in this region gallbladder polyp/neoplasm is a concern.  RUQ [place] recommended to further evaluate.   Labs 12/16/2022 with normal CEA, hemoglobin 8.7, MCV 77.1, platelets 207, albumin 3.2, AST 41, ALT 16, alk phos 42, T. bili 0.7, A1c 6.6   Labs 01/20/2023 with hemoglobin 7.4, MCV 78.4, platelets 226, glucose 136, albumin 3.2, AST 36, ALT 13, alk phos 49, T. bili 0.6, sodium 136, creatinine 0.81, magnesium 1.7  Last  office visit 01/26/2023.  Just had first chemo treatment and has upcoming iron infusion scheduled.  Stable blood sugars.  Continue weight loss since last visit.  Following with Dr. Kirtland Bouchard regarding her rectal cancer.  Experience rectal bleeding Sunday before this visit.  Has lack of energy and appetite.  Having lower abdominal pain that occurs all the time.  Feeling nauseous morning of visit.  Began having some diarrhea after chemo treatment initially started to improve.  Advised by cancer center that she could take Imodium.  Swallowing good.  Acid reflux symptoms come and go.  Due to have surgery by [facility] after receiving chemo and radiation.  Labs after last visit revealed a hemoglobin of 5.8 and she was sent to [facility] to have 2 units of blood in place of IV iron infusion.  Acute hepatitis panel negative.  CT A/P with contrast 02/21/2023: - Fluid-filled to the rectum, consistent with diarrhea -Sigmoid diverticulosis with mild wall thickening of sigmoid -Unchanged rectal mass -Unchanged partially exophytic lesion of posterior midportion of right kidney which remains suspicious (consider MRI for further evaluation) -Cholelithiasis  MRI pelvis 04/12/2023: -Rectal adenocarcinoma T3c, N0 -Distance from tumor to internal and sphincters 2.4 cm  Labs 04/20/2023: Hemoglobin 9.9, platelets 177  Today: Cirrhosis - No swelling. No jaundice, pruritus. No confusion or hallucinations.    IDA - Significant fatigue. Has received at least 4 iron infusions. Has also  received a few units of blood. Some days she got iron and blood at the same time. Rectal bleeding has subsided. No melena. No rectal pain.   GERD - Has been manageable. Still having a little reflux breakthrough daily but okay with current regimen. Has enough zofran has been taking it 3 tims a day regardless of symptoms.   Rectal CA - finished chemo. Starting radiation on 6/17. Has no energy and no appetite. Taking ensure daily, pudding, yogurt. Able to keep down  bland foods and soft foods for the most part. Doing 1 ensure a day if she is lucky. Seen Dr. Kirtland Bouchard about a month ago. She has been given things to help her appetite but has not helped much at all. Megace was not helpful. Was not able to get Marinol due to national shortage.   Has been taken off all of her diabetes medication.   No longer having diarrhea. No constipation. Stools are watery and stay that way about 2 times per day. Takes imodium 2 tablets once daily.   Wt Readings from Last 3 Encounters:  04/29/23 221 lb 6.4 oz (100.4 kg)  04/28/23 222 lb 12.8 oz (101.1 kg)  04/22/23 227 lb 9.6 oz (103.2 kg)    Current Outpatient Medications  Medication Sig Dispense Refill   blood glucose meter kit and supplies KIT Dispense based on patient and insurance preference. Use up to four times daily as directed. (FOR ICD-10 E11.65) Number of Strips 400 Number of Lancets 400 1 each 0   Blood Glucose Monitoring Suppl (TRUE METRIX METER) w/Device KIT 1 each by Does not apply route 2 (two) times daily. Use to test BG bid E11.65 1 kit 0   diphenoxylate-atropine (LOMOTIL) 2.5-0.025 MG tablet Take 1 tablet by mouth 4 (four) times daily. (Patient taking differently: Take 2 tablets by mouth 4 (four) times daily.) 360 tablet 3   DROPLET PEN NEEDLES 31G X 5 MM MISC USE AS INSTRUCTED TO INJECT INSULIN DAILY 100 each 3   FLUOROURACIL IV Inject into the vein every 14 (fourteen) days. (Patient not taking: Reported on 04/30/2023)     HYDROcodone-acetaminophen (NORCO) 5-325 MG tablet Take 1 tablet by mouth every 6 (six) hours as needed for severe pain. 30 tablet 0   IRINOTECAN HCL IV Inject into the vein every 14 (fourteen) days. (Patient not taking: Reported on 04/30/2023)     LEUCOVORIN CALCIUM IV Inject into the vein every 14 (fourteen) days. (Patient not taking: Reported on 04/30/2023)     levothyroxine (SYNTHROID) 88 MCG tablet TAKE 1 TABLET EVERY DAY BEFORE BREAKFAST (Patient taking differently: Take 88 mcg by mouth daily  before breakfast.) 90 tablet 3   loperamide (IMODIUM) 2 MG capsule Take 1 capsule (2 mg total) by mouth as needed for diarrhea or loose stools. TAKE 1 CAPSULE AS DIRECTED AS NEEDED FOR DIARRHEA OR LOOSE STOOLS 30 capsule 11   OLANZapine (ZYPREXA) 5 MG tablet Take 2 tablets (10 mg total) by mouth at bedtime. 60 tablet 2   ondansetron (ZOFRAN-ODT) 4 MG disintegrating tablet Take 1 tablet (4 mg total) by mouth every 8 (eight) hours as needed for nausea or vomiting. DISSOLVE 2 TAB ON TONGUE EVERY 8HR AS NEEDED NAUSEA, VOMITING (BETWEEN COMPAZINE DOSES FOR MAX RELIEF) Strength: 4 mg 60 tablet 1   OXALIPLATIN IV Inject into the vein every 14 (fourteen) days. (Patient not taking: Reported on 04/30/2023)     pantoprazole (PROTONIX) 40 MG tablet Take 1 tablet (40 mg total) by mouth 2 (two) times  daily before a meal. 60 tablet 3   Potassium Chloride 40 MEQ/15ML (20%) SOLN Take 15 mLs by mouth 3 (three) times daily. 473 mL 0   prochlorperazine (COMPAZINE) 10 MG tablet Take 1 tablet (10 mg total) by mouth every 8 (eight) hours as needed for nausea or vomiting. TAKE 1 TABLET EVERY 6 HOURS AS NEEDED FOR NAUSEA OR VOMITING Strength: 10 mg 30 tablet 3   TRUE METRIX BLOOD GLUCOSE TEST test strip TEST BLOOD SUGAR TWICE DAILY 200 strip 3   Feeding Tubes (FEEDING TUBE ATTACHMENT DEVICE) MISC 3 in 1 Bedside commode     lidocaine (XYLOCAINE) 2 % solution      Misc. Devices (ROLLATOR ULTRA-LIGHT) MISC Rollator walker     No current facility-administered medications for this visit.   Facility-Administered Medications Ordered in Other Visits  Medication Dose Route Frequency Provider Last Rate Last Admin   0.9 %  sodium chloride infusion   Intravenous Continuous [name], MD   Stopped at 02/17/23 1535    Past Medical History:  Diagnosis Date   Anxiety    Cancer [facility])    Coronary atherosclerosis    Cardiac CT 09/2018 with calcium score 8.7 and mild proximal LAD disease   Essential hypertension 2009   GERD  (gastroesophageal reflux disease)    Hypothyroidism    Iron deficiency anemia    Osteoarthritis    Palpitations    Tachypalpitations on beta blockers since 2010   Type 2 diabetes mellitus (HCC) 2003   Vitamin B 12 deficiency    Vitamin D deficiency     Past Surgical History:  Procedure Laterality Date   ABDOMINAL HYSTERECTOMY     History of cervical cancer   BIOPSY  12/16/2022   Procedure: BIOPSY;  Surgeon: Corbin Ade, MD;  [facility];  Service: Endoscopy;;  gastric, rectal   BREAST BIOPSY Right    COLONOSCOPY WITH PROPOFOL N/A 12/16/2022   Procedure: COLONOSCOPY WITH PROPOFOL;  Surgeon: Corbin Ade, MD;  [facility]: [facility];  Service: Endoscopy;  Laterality: N/A;  10:45 am   ESOPHAGOGASTRODUODENOSCOPY (EGD) WITH PROPOFOL N/A 12/16/2022   Procedure: ESOPHAGOGASTRODUODENOSCOPY (EGD) WITH PROPOFOL;  Surgeon: Corbin Ade, MD;  [facility];  Service: Endoscopy;  Laterality: N/A;   IR IMAGING GUIDED PORT INSERTION  01/15/2023   MALONEY DILATION N/A 12/16/2022   Procedure: Elease Hashimoto DILATION;  Surgeon: Corbin Ade, MD;  [facility];  Service: Endoscopy;  Laterality: N/A;   POLYPECTOMY  12/16/2022   Procedure: POLYPECTOMY;  Surgeon: Corbin Ade, MD;  [facility];  Service: Endoscopy;;   TONSILLECTOMY      Family History  Problem Relation Age of Onset   Heart failure Mother    Diabetes Father    Hypertension Father    Stroke Sister 68   Diabetes Brother    Cancer - Colon Other        age 50   Colon polyps Neg Hx     Allergies as of 04/28/2023 - Review Complete 04/28/2023  Allergen Reaction Noted   Contrast media [iodinated contrast media] Other (See Comments) 10/04/2018   Sulfa antibiotics Nausea And Vomiting 04/02/2011   Doxepin Rash and Swelling 04/02/2011   Tape Rash 01/20/2023    Social History   Socioeconomic History   Marital status: Single    Spouse name: Not on file   Number of children: 2    Years of education: Not on file   Highest education level:  Not on file  Occupational History   Occupation: DISABLED    Employer: UNEMPLOYED    Comment: [employer]  Tobacco Use   Smoking status: Never   Smokeless tobacco: Never  Vaping Use   Vaping Use: Never used  Substance and Sexual Activity   Alcohol use: No   Drug use: No   Sexual activity: Never  Other Topics Concern   Not on file  Social History Narrative   Has 2 grandchildren. 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 Denies fatigue, weakness, weight loss.  CV: Denies chest pain, palpitations, syncope, peripheral edema, and claudication. Resp: Denies dyspnea at rest, cough, wheezing, coughing up blood, and pleurisy. GI: See HPI Derm: Denies rash, itching, dry skin Psych: Denies depression, anxiety, memory loss, confusion. No homicidal or suicidal ideation.  Heme: Denies bruising, bleeding, and enlarged lymph nodes.  Physical Exam   BP 91/63 (BP Location: Left Arm, Patient Position: Sitting, Cuff Size: Large)   Pulse (!) 104   Ht 5\' 7"  (1.702 m)   Wt 222 lb 12.8 oz (101.1 kg)   BMI 34.90 kg/m   General:   Alert and oriented.  Cooperative.  Ill-appearing. Head:  Normocephalic and atraumatic. Eyes:  Conjuctiva clear without scleral icterus. Mouth:  Oral mucosa pink and moist. Good dentition. No lesions. Lungs:  Clear to  auscultation bilaterally. No wheezes, rales, or rhonchi. No distress.  Heart:  S1, S2 present without murmurs appreciated.  Abdomen:  +BS, soft, non-tender and non-distended. No rebound or guarding. No HSM or masses noted. Rectal: deferred Msk:  Weak. Patient in wheelchair.  Extremities:  Without edema. Neurologic:  Alert and  oriented x4 Psych:  Alert and cooperative. Normal mood and affect.   Assessment  ARCOLA MATHIEU is a 67 y.o. female with a history of IDDM with diabetic peripheral neuropathy, RAI induced hypothyroidism, HTN, HLD, aortic valve disease, CAD, GERD, IDA, cirrhosis, and recently diagnosed rectal cancer following with Dr. Kirtland Bouchard currently receiving chemo presenting today for follow-up.  Rectal neoplasm, lack of appetite, rectal bleeding, intermittent diarrhea: Colonoscopy January 2024 revealed rectal tumor with high-grade dysplasia and protruding from anal orifice.  Staging felt to be T3b, possibly T4 N0.  PET scan without any pelvic lymphadenopathy.  Has been following with Dr. Kirtland Bouchard with oncology.  Just recently finished chemo treatment.  Will start radiation 05/10/2023.  Continues to have plans for surgical intervention after radiation.  Continues to experience significant fatigue.  Continues to lose weight due to lack of appetite and poor p.o. intake.  Megace not helpful for increase in appetite.  States she has been unable to get Marinol due to Sport and exercise psychologist.  Has been attempting to get at least 1 Ensure daily but has been following more of a soft and bland food diet when she does eat.  Stools to continue to  be loose in nature but has been having only 2/day on average and has been taking Imodium 2 tablets once daily for this.  No longer having any rectal bleeding.  Encouraged high-protein diet and to continue Ensure supplementation.  Discussed possibility of protein powder to make shakes versus store-bought brands.  Advise she may continue to use Imodium as needed  Cirrhosis: Found  during most recent imaging in January 2024, likely secondary to NASH.  Has had evidence of portal venous hypertension as well as splenomegaly.  Previous lab workup with normal LFTs.  Recent improvement in thrombocytopenia.  Has lost a lot of weight recently given her cancer diagnosis as well as treatment with chemotherapy.  Currently without any signs of hepatic encephalopathy.  Denies any lower extremity edema, jaundice, pruritus, confusion, or hallucinations.  IDA: Most recent hemoglobin 9.9.  She has received multiple IV iron infusions as well as multiple blood transfusions.  IDA likely in the setting of known malignancy.  Continues to follow closely with hematology/oncology.  Has been having frequent blood work performed to assess hemoglobin.  She continues to take oral iron supplementation as well as B12 injections.  Advised her to continue daily iron supplementation.  Denies any melena or BRBPR.   GERD, nausea: Has been manageable. Still having a little bit of reflux breakthrough throughout the day but overall pleased with current regimen. Continues to have nausea therefore advised her she could continue to use Zofran up to 3 times daily as needed  Liver lesions: Noted to have several tiny low-density foci within the liver on recent CT abdomen pelvis in 4.  Also with subtle too small to characterize hypoattenuating lesions in the dome on PET scan in February 2024.  Most recent labs with stable LFTs.  PLAN   Imodium as needed Will discuss remeron with Dr. Ellin Saba to help with appetite given inability to get Marinol.  Continue Zofran up to 3 times a day for nausea. Continue pantoprazole 40 mg twice daily Continue iron supplementation and IV iron as needed, following with hematology/oncology Follow a high-protein diet. Goal of at least 1 Ensure or protein shake per day.  The serving should have at least 25 g of protein. Plan to repeat CT with liver protocol in 3 months    Brooke Bonito,  MSN, FNP-BC, AGACNP-BC [facility]

## 2023-04-28 NOTE — Progress Notes (Unsigned)
[facility] [address], [place] [address] Phone: [phone] Fax: [phone]  SYMPTOM MANAGEMENT CLINIC PROGRESS NOTE   [name] [id number] [birth year] 67 y.o.  [name] is managed by Dr. Ellin Saba for her stage IIc rectal cancer   Actively treated with chemotherapy/immunotherapy/hormonal therapy: YES   Current therapy: FOLFIRINOX   Last treated: Cycle #6 started 03/31/2023 with pump D/C and Udenyca on 04/02/2023  INTERVAL HISTORY:  Chief Complaint: Chemotherapy follow-up & symptom management visit  At today's visit, [name] reports feeling overall ***, since she has now gone almost a month since she last had chemotherapy treatment.  She is anticipating moving forward with radiation, oral chemotherapy (Xeloda), and surgery.  ***  *** Fatigue, weakness *** Lightheadedness *** Nausea/vomiting + *** nausea/vomiting medications (olanzapine, Compazine, Zofran, scopolamine patch, dexamethasone) *** Diarrhea + *** diarrhea medications (Lomotil, Imodium) *** Rectal bleeding *** She denies any mouth sores, pelvic/rectal pain, or worsening of her chronic diabetic peripheral neuropathy ***PERTINENT NEGATIVES: No changes in urine, rash/skin changes, peripheral edema, chest pain, shortness of breath, dyspnea.   *** Appetite (dexamethasone 2 mg daily) *** Food/Ensure *** Liquids *** Weight *** *** Appetite and *** energy  ***She is taking potassium 40 mL 3 times daily + magnesium twice daily.  *** Anxiety/depression - restarted Prozac 20 mg daily???   ASSESSMENT & PLAN:  ## STAGE IIC RECTAL CANCER - Primary oncologist is Dr. Ellin Saba - Patient is on treatment plan FOLFIRINOX q. 14 days x 8 cycles - C6/D1 of FOLFIRINOX on 03/31/2023 with pump D/C on 04/02/2023 - She received Udenyca on 04/02/2023 - Per note by Dr. Ellin Saba on 04/14/2023, patient had adequate response to IV chemotherapy seen on recent MRI pelvis, therefore chemotherapy was  discontinued after cycle #6. - Patient is moving onto next phase of treatment with radiation, Xeloda, and surgery.  *** - She is seeing radiation oncology in [facility] with [place] (Dr. Mitzi Hansen) - Her surgical oncologist is Dr. Maisie Fus. - PLAN: She is scheduled to start taking her capecitabine (Xeloda) on 05/10/2023, on the same day that she starts radiation therapy.  *** - *** We reviewed common side effects of Xeloda *** - *** Follow-up ***    *** RESUME BELOW ***    # Cancer cachexia # Weight loss # Protein calorie malnutrition - Severely decreased appetite and very poor oral intake. - No improvement on Megace.  Unable to obtain Marinol due to national shortage. - Mounjaro, metformin, and NovoLog were discontinued by endocrinology. - Weight today is 236 pounds, stable since 03/31/2023, but continuing to trend downward overall (down 13 pounds in the past 2 weeks) No improvement with Megace or increased dose of olanzapine.  Marinol unavailable due to Sport and exercise psychologist. We will start her on DEXAMETHASONE 2 MG each morning with breakfast. - PLAN: Increase olanzapine to 10 mg nightly.  Continue close follow-up with nutritionist. - Endocrinologist notified about increased olanzapine causing potential blood sugar disturbances.  Instructions given to patient.  # Diarrhea - Diarrhea has improved now that she is taking Lomotil 2 tablets 4 times daily.  She will have 1-2 loose bowel movements in the morning, but these resolve after Imodium. - PLAN: Continue Lomotil 2 tablets Q.I.D. with prn Imodium  # Nausea and vomiting - Refractory chemo-induced nausea/vomiting despite Compazine and Zofran - Improved after starting olanzapine 5 mg nightly  Patient is taking olanzapine 10 mg QHS, as well as alternating doses of Compazine and Zofran. Despite the above, she continues to  have breakthrough nausea and vomiting. Prescription sent to pharmacy for [facility] Q 72 hours. We will also start  her on dexamethasone 2 mg each morning with breakfast. - PLAN: Continue olanzapine nightly (increased to 10 mg to help w/ appetite stimulation, as above) - Continue Compazine every 6-8 hours with Zofran 8 mg ODT TID PRN breakthrough nausea/vomiting.  # Insomnia - PLAN: Olanzapine increased to 10 mg nightly for treatment of cachexia and nausea.  If she continues to have significant insomnia, would consider adding low-dose temazepam.  # Dehydration - Fluid intake has improved, but not yet at goal - Evidenced with marginal blood pressure, lightheadedness, and tachycardia - PLAN: 1500 mL NS given in clinic  # Physical deconditioning # Generalized weakness with fall at home - Patient had fall at home x 1 (fell backwards onto bed, no injuries reported) - She has significant progressive weakness and muscle loss related to the above - PLAN: Referral sent for home health Physical Therapy  # Hypokalemia # Hypomagnesemia - She is taking magnesium 400 mg twice daily and potassium 40 mEq twice daily - Continues to have hypokalemia and hypomagnesemia despite oral repletion, likely secondary to GI losses - PLAN: Continue magnesium 400 mg twice daily - We will increase her oral potassium repletion to 40 mEq THREE TIMES daily - IV repletion today with 40 mEq IV potassium - Recheck labs and give possible IV electrolytes on Wednesday and Friday this week  # Depression Patient had stopped taking her Prozac due to her own worry that it might interact with her chemotherapy. Recommend that patient RESTART her fluoxetine 20 mg daily. - PLAN: Continue fluoxetine 20 mg  # Chemotherapy-induced pancytopenia, IMPROVED - CBC today WBC 23.1/ANC 22.4, ALC 0.5, hemoglobin 10.4/MCV 90.8, normal platelets 156 - Patient does also have underlying liver cirrhosis, but has previously had normal WBCs and platelets. - White blood counts have improved after she was started on Udenyca (last dose given 04/02/2023)  # Iron  deficiency anemia secondary to rectal bleeding - She has intermittent bleeding per rectum from cancer site, denies any rectal bleeding this week - She has required transfusions on a regular basis.   - She has received IV Venofer 400 mg x 3 (most recently on 02/23/2023) - PLAN: Recheck ferritin and iron/TIBC with labs on Wednesday    # Diabetic peripheral neuropathy - She has no feeling in her toes.  No worsening since oxaliplatin started, but reports onset of cold sensitivity after second cycle of chemotherapy.   - PLAN: Continue Lyrica twice daily.    # Pelvic pain -  Intermittent pelvic pain, none today. - She has prescription for hydrocodone 5 mg every 4-6 hours as needed for severe pain.  # Folic acid deficiency - PLAN: Continue folic acid 1 mg tablet daily  PLAN SUMMARY: >> ***    REVIEW OF SYSTEMS:***  Review of Systems  Constitutional:  Positive for activity change, appetite change, fatigue and unexpected weight change. Negative for chills, diaphoresis and fever.  HENT:  Positive for trouble swallowing (pills). Negative for mouth sores, nosebleeds and sore throat.   Respiratory:  Negative for cough and shortness of breath.   Cardiovascular:  Negative for chest pain, palpitations and leg swelling.  Gastrointestinal:  Positive for diarrhea, nausea and vomiting. Negative for abdominal pain, blood in stool and constipation.  Genitourinary:  Negative for dysuria and hematuria.  Neurological:  Positive for weakness and light-headedness. Negative for dizziness, numbness and headaches.  Psychiatric/Behavioral:  Positive for dysphoric mood  and sleep disturbance. The patient is nervous/anxious.     Past Medical History, Surgical history, Social history, and Family history were reviewed as documented elsewhere in chart, and were updated as appropriate.   OBJECTIVE:***  Physical Exam:  There were no vitals taken for this visit. ECOG: 3  Physical Exam Constitutional:       Appearance: Normal appearance. She is morbidly obese.     Comments: Somewhat weak appearing on exam  Cardiovascular:     Rate and Rhythm: Tachycardia present.     Heart sounds: Murmur heard.  Pulmonary:     Breath sounds: Normal breath sounds.  Musculoskeletal:     Right lower leg: Edema (1+) present.     Left lower leg: Edema (1+) present.  Skin:    General: Skin is warm and dry.     Coloration: Skin is pale.     Comments: Poor turgor  Neurological:     General: No focal deficit present.     Mental Status: Mental status is at baseline.  Psychiatric:        Behavior: Behavior normal. Behavior is cooperative.    Lab Review:     Component Value Date/Time   NA 135 04/20/2023 1020   NA 139 09/03/2022 1305   K 3.6 04/20/2023 1020   CL 105 04/20/2023 1020   CO2 17 (L) 04/20/2023 1020   GLUCOSE 132 (H) 04/20/2023 1020   BUN 9 04/20/2023 1020   BUN 8 09/03/2022 1305   CREATININE 0.81 04/20/2023 1020   CREATININE 0.84 01/26/2023 1503   CALCIUM 8.4 (L) 04/20/2023 1020   PROT 5.6 (L) 04/20/2023 1020   PROT 6.6 09/03/2022 1305   ALBUMIN 2.7 (L) 04/20/2023 1020   ALBUMIN 3.7 (L) 09/03/2022 1305   AST 35 04/20/2023 1020   ALT 16 04/20/2023 1020   ALKPHOS 100 04/20/2023 1020   BILITOT 0.9 04/20/2023 1020   BILITOT 0.3 09/03/2022 1305   GFRNONAA >60 04/20/2023 1020   GFRNONAA 78 12/14/2019 0848   GFRAA 81 12/11/2020 0908   GFRAA 90 12/14/2019 0848       Component Value Date/Time   WBC 6.4 04/20/2023 1020   RBC 3.34 (L) 04/20/2023 1020   HGB 9.9 (L) 04/20/2023 1020   HGB 8.6 (L) 11/26/2022 1343   HCT 31.4 (L) 04/20/2023 1020   HCT 30.5 (L) 11/26/2022 1343   PLT 177 04/20/2023 1020   PLT 199 11/26/2022 1343   MCV 94.0 04/20/2023 1020   MCV 76 (L) 11/26/2022 1343   MCH 29.6 04/20/2023 1020   MCHC 31.5 04/20/2023 1020   RDW 27.4 (H) 04/20/2023 1020   RDW 16.6 (H) 11/26/2022 1343   LYMPHSABS 1.5 04/20/2023 1020   MONOABS 0.9 04/20/2023 1020   EOSABS 0.1 04/20/2023 1020    BASOSABS 0.1 04/20/2023 1020   -------------------------------  Imaging from last 24 hours (if applicable): Radiology interpretation: MR PELVIS WO CM RECTAL CA STAGING  Result Date: 04/14/2023 CLINICAL DATA:  Rectal cancer, status post chemotherapy and immunotherapy . EXAM: MRI PELVIS WITHOUT CONTRAST TECHNIQUE: Multiplanar multisequence MR imaging of the pelvis was performed. No intravenous contrast was administered. Ultrasound gel was administered per rectum to optimize tumor evaluation. COMPARISON:  12/24/2022 MRI.  PET of 12/31/2022 FINDINGS: TUMOR LOCATION Tumor distance from Anal Verge/Skin surface: 6.1 cm on 17/3 Tumor distance to Internal Anal sphincter: 2.4 cm on 15/6 TUMOR DESCRIPTION Circumferential extent: 180 degrees, centered about the 4 o'clock position on 19/11 Tumor Size and volume: 3.6 x 2.1 x 3.0 cm (  volume = 12 cm^3)on 19/11 and 16/12. Compare 35 cc on the prior. T - CATEGORY Extension through [employer]:T3c-at the 2 o'clock position, decreased macroscopic tumor extends to contact the mesorectal fascia including at 1.4 x 1.4 cm in 20/11. Decreased from 2.4 x 2.0 cm on the prior. Residual restricted diffusion including 924/10. Shortest Distance of any tumor/node from Mesorectal fascia: Not applicable. Contact again identified with the mesorectal fascia. Extramural Vascular Invasion/Tumor Thrombus: No Invasion of Anterior Peritoneal Reflection: No Involvement of Adjacent Organs or Pelvic Sidewall: Equivocal-decreased soft tissue extending towards the left vaginal apex and pelvic floor musculature including on 24/4. Levator Ani Involvement: No N - CATEGORY Mesorectal Lymph Nodes >=60mm: None=N0 Extra-mesorectal Lymphadenopathy: No Other: Poor flow laxity. Hysterectomy. No significant free fluid. Small fat containing right inguinal hernia. IMPRESSION: Rectal adenocarcinoma T stage: T3c Rectal adenocarcinoma N stage:  N0 Distance from tumor to the internal anal sphincter is 2.4 cm.  Electronically Signed   By: Jeronimo Greaves M.D.   On: 04/14/2023 10:29      WRAP UP:  All questions were answered. The patient knows to call the clinic with any problems, questions or concerns.  Medical decision making: High***  Time spent on visit: I spent *** minutes counseling the patient face to face. The total time spent in the appointment was *** minutes and more than 50% was on counseling.  Carnella Guadalajara, PA-C  ***

## 2023-04-28 NOTE — Patient Instructions (Addendum)
As we discussed I want you to focus on protein intake.  Given your lack of appetite I would try to get at least 2 protein shakes and per day.  You may switch from Ensure to boost, fair life, Premier protein, or any other nutritional shake.  As we discussed you may also use a powder supplement mixed with almond milk to increase protein intake.  Also focus on lean meats such as chicken and [place].  Continue your pantoprazole 40 mg twice daily.   Continue to use Imodium as needed for diarrhea, hopefully this will subside the longer you are off chemotherapy.  I will discuss with Dr. Kirtland Bouchard regarding remeron for your appetite since you are unable to obtain Marinol.  We do need to consider further imaging of your liver in 3 months, this is our standard cirrhosis surveillance. I will see you in the office in 3 months, sooner if needed.   It was a pleasure to see you today. I want to create trusting relationships with patients. If you receive a survey regarding your visit,  I greatly appreciate you taking time to fill this out on paper or through your MyChart. I value your feedback.  Brooke Bonito, MSN, FNP-BC, AGACNP-BC [facility]

## 2023-04-29 ENCOUNTER — Inpatient Hospital Stay: Payer: Medicare HMO

## 2023-04-29 ENCOUNTER — Ambulatory Visit: Payer: Medicare HMO | Admitting: Dietician

## 2023-04-29 ENCOUNTER — Ambulatory Visit: Payer: Medicare HMO | Admitting: Gastroenterology

## 2023-04-29 ENCOUNTER — Inpatient Hospital Stay (HOSPITAL_BASED_OUTPATIENT_CLINIC_OR_DEPARTMENT_OTHER): Payer: Medicare HMO | Admitting: Physician Assistant

## 2023-04-29 VITALS — BP 121/83 | HR 101 | Temp 97.8°F | Resp 18 | Wt 221.4 lb

## 2023-04-29 VITALS — BP 110/82 | HR 72 | Resp 18

## 2023-04-29 DIAGNOSIS — D509 Iron deficiency anemia, unspecified: Secondary | ICD-10-CM

## 2023-04-29 DIAGNOSIS — E1142 Type 2 diabetes mellitus with diabetic polyneuropathy: Secondary | ICD-10-CM | POA: Insufficient documentation

## 2023-04-29 DIAGNOSIS — E876 Hypokalemia: Secondary | ICD-10-CM | POA: Insufficient documentation

## 2023-04-29 DIAGNOSIS — R531 Weakness: Secondary | ICD-10-CM | POA: Insufficient documentation

## 2023-04-29 DIAGNOSIS — F32A Depression, unspecified: Secondary | ICD-10-CM

## 2023-04-29 DIAGNOSIS — E860 Dehydration: Secondary | ICD-10-CM | POA: Insufficient documentation

## 2023-04-29 DIAGNOSIS — C20 Malignant neoplasm of rectum: Secondary | ICD-10-CM | POA: Insufficient documentation

## 2023-04-29 DIAGNOSIS — R64 Cachexia: Secondary | ICD-10-CM

## 2023-04-29 DIAGNOSIS — R112 Nausea with vomiting, unspecified: Secondary | ICD-10-CM | POA: Insufficient documentation

## 2023-04-29 DIAGNOSIS — T451X5A Adverse effect of antineoplastic and immunosuppressive drugs, initial encounter: Secondary | ICD-10-CM | POA: Insufficient documentation

## 2023-04-29 DIAGNOSIS — R197 Diarrhea, unspecified: Secondary | ICD-10-CM | POA: Diagnosis not present

## 2023-04-29 DIAGNOSIS — Z95828 Presence of other vascular implants and grafts: Secondary | ICD-10-CM

## 2023-04-29 DIAGNOSIS — F419 Anxiety disorder, unspecified: Secondary | ICD-10-CM | POA: Diagnosis not present

## 2023-04-29 DIAGNOSIS — R1310 Dysphagia, unspecified: Secondary | ICD-10-CM

## 2023-04-29 DIAGNOSIS — R102 Pelvic and perineal pain: Secondary | ICD-10-CM | POA: Diagnosis not present

## 2023-04-29 DIAGNOSIS — E46 Unspecified protein-calorie malnutrition: Secondary | ICD-10-CM | POA: Diagnosis not present

## 2023-04-29 DIAGNOSIS — Z79899 Other long term (current) drug therapy: Secondary | ICD-10-CM | POA: Diagnosis not present

## 2023-04-29 DIAGNOSIS — G4700 Insomnia, unspecified: Secondary | ICD-10-CM | POA: Diagnosis not present

## 2023-04-29 DIAGNOSIS — K521 Toxic gastroenteritis and colitis: Secondary | ICD-10-CM

## 2023-04-29 DIAGNOSIS — E538 Deficiency of other specified B group vitamins: Secondary | ICD-10-CM | POA: Diagnosis not present

## 2023-04-29 DIAGNOSIS — Z510 Encounter for antineoplastic radiation therapy: Secondary | ICD-10-CM | POA: Insufficient documentation

## 2023-04-29 LAB — CBC WITH DIFFERENTIAL/PLATELET
Abs Immature Granulocytes: 0.01 10*3/uL (ref 0.00–0.07)
Basophils Absolute: 0.1 10*3/uL (ref 0.0–0.1)
Basophils Relative: 2 %
Eosinophils Absolute: 0 10*3/uL (ref 0.0–0.5)
Eosinophils Relative: 0 %
HCT: 37.7 % (ref 36.0–46.0)
Hemoglobin: 11.6 g/dL — ABNORMAL LOW (ref 12.0–15.0)
Immature Granulocytes: 0 %
Lymphocytes Relative: 29 %
Lymphs Abs: 1.6 10*3/uL (ref 0.7–4.0)
MCH: 30.2 pg (ref 26.0–34.0)
MCHC: 30.8 g/dL (ref 30.0–36.0)
MCV: 98.2 fL (ref 80.0–100.0)
Monocytes Absolute: 0.5 10*3/uL (ref 0.1–1.0)
Monocytes Relative: 10 %
Neutro Abs: 3.2 10*3/uL (ref 1.7–7.7)
Neutrophils Relative %: 59 %
Platelets: 163 10*3/uL (ref 150–400)
RBC: 3.84 MIL/uL — ABNORMAL LOW (ref 3.87–5.11)
RDW: 22.5 % — ABNORMAL HIGH (ref 11.5–15.5)
WBC: 5.4 10*3/uL (ref 4.0–10.5)
nRBC: 0 % (ref 0.0–0.2)

## 2023-04-29 LAB — COMPREHENSIVE METABOLIC PANEL
ALT: 18 U/L (ref 0–44)
AST: 45 U/L — ABNORMAL HIGH (ref 15–41)
Albumin: 2.8 g/dL — ABNORMAL LOW (ref 3.5–5.0)
Alkaline Phosphatase: 103 U/L (ref 38–126)
Anion gap: 11 (ref 5–15)
BUN: 10 mg/dL (ref 8–23)
CO2: 22 mmol/L (ref 22–32)
Calcium: 8.3 mg/dL — ABNORMAL LOW (ref 8.9–10.3)
Chloride: 103 mmol/L (ref 98–111)
Creatinine, Ser: 0.71 mg/dL (ref 0.44–1.00)
GFR, Estimated: >60 mL/min (ref >60)
Glucose, Bld: 105 mg/dL — ABNORMAL HIGH (ref 70–99)
Potassium: 2.9 mmol/L — ABNORMAL LOW (ref 3.5–5.1)
Sodium: 136 mmol/L (ref 135–145)
Total Bilirubin: 1.5 mg/dL — ABNORMAL HIGH (ref 0.3–1.2)
Total Protein: 5.9 g/dL — ABNORMAL LOW (ref 6.5–8.1)

## 2023-04-29 LAB — SAMPLE TO BLOOD BANK

## 2023-04-29 LAB — MAGNESIUM: Magnesium: 1.6 mg/dL — ABNORMAL LOW (ref 1.7–2.4)

## 2023-04-29 MED ORDER — SODIUM CHLORIDE 0.9% FLUSH
10.0000 mL | Freq: Once | INTRAVENOUS | Status: AC
  Administered 2023-04-29: 10 mL via INTRAVENOUS

## 2023-04-29 MED ORDER — POTASSIUM CHLORIDE IN NACL 20-0.9 MEQ/L-% IV SOLN
Freq: Once | INTRAVENOUS | Status: AC
  Filled 2023-04-29: qty 1000

## 2023-04-29 MED ORDER — MAGNESIUM SULFATE 2 GM/50ML IV SOLN
2.0000 g | Freq: Once | INTRAVENOUS | Status: AC
  Administered 2023-04-29: 2 g via INTRAVENOUS
  Filled 2023-04-29: qty 50

## 2023-04-29 MED ORDER — SODIUM CHLORIDE 0.9 % IV SOLN: INTRAVENOUS | Status: DC

## 2023-04-29 MED ORDER — SODIUM CHLORIDE 0.9% FLUSH
10.0000 mL | INTRAVENOUS | Status: DC | PRN
  Administered 2023-04-29: 10 mL via INTRAVENOUS

## 2023-04-29 MED ORDER — ONDANSETRON HCL 4 MG/2ML IJ SOLN
8.0000 mg | Freq: Once | INTRAMUSCULAR | Status: AC
  Administered 2023-04-29: 8 mg via INTRAVENOUS
  Filled 2023-04-29: qty 4

## 2023-04-29 MED ORDER — HEPARIN SOD (PORK) LOCK FLUSH 100 UNIT/ML IV SOLN
500.0000 [IU] | Freq: Once | INTRAVENOUS | Status: AC
  Administered 2023-04-29: 500 [IU] via INTRAVENOUS

## 2023-04-29 NOTE — Progress Notes (Signed)
Patient tolerated therapy with no complaints voiced. Side effects with management reviewed with understanding verbalized. Port site clean and dry with no bruising or swelling noted at site. Good blood return noted before and after administration of therapy. Patient left in satisfactory condition with VSS and no s/s of distress noted.

## 2023-04-29 NOTE — Progress Notes (Signed)
Patient is here today after follow up visit with provider.  She is not feeling well today. She is nauseaous and requesting something to help.  Orders received for zofran 8mg  iv x 1 dose now.  Pharmacy aware.  Patient overall doesn't feel well.  She will get fluids and electrolytes per orders, see MAR.

## 2023-04-29 NOTE — Patient Instructions (Signed)
[facility]  Discharge Instructions: Thank you for choosing [facility] to provide your oncology and hematology care.  If you have a lab appointment with the Cancer Center - please note that after April 8th, 2024, all labs will be drawn in the cancer center.  You do not have to check in or register with the main entrance as you have in the past but will complete your check-in in the cancer center.  Wear comfortable clothing and clothing appropriate for easy access to any Portacath or PICC line.   We strive to give you quality time with your provider. You may need to reschedule your appointment if you arrive late (15 or more minutes).  Arriving late affects you and other patients whose appointments are after yours.  Also, if you miss three or more appointments without notifying the office, you may be dismissed from the clinic at the provider's discretion.      For prescription refill requests, have your pharmacy contact our office and allow 72 hours for refills to be completed.    Today you received the following hydration therapy return as scheduled.   To help prevent nausea and vomiting after your treatment, we encourage you to take your nausea medication as directed.  BELOW ARE SYMPTOMS THAT SHOULD BE REPORTED IMMEDIATELY: *FEVER GREATER THAN 100.4 F (38 C) OR HIGHER *CHILLS OR SWEATING *NAUSEA AND VOMITING THAT IS NOT CONTROLLED WITH YOUR NAUSEA MEDICATION *UNUSUAL SHORTNESS OF BREATH *UNUSUAL BRUISING OR BLEEDING *URINARY PROBLEMS (pain or burning when urinating, or frequent urination) *BOWEL PROBLEMS (unusual diarrhea, constipation, pain near the anus) TENDERNESS IN MOUTH AND THROAT WITH OR WITHOUT PRESENCE OF ULCERS (sore throat, sores in mouth, or a toothache) UNUSUAL RASH, SWELLING OR PAIN  UNUSUAL VAGINAL DISCHARGE OR ITCHING   Items with * indicate a potential emergency and should be followed up as soon as possible or go to the Emergency  Department if any problems should occur.  Please show the CHEMOTHERAPY ALERT CARD or IMMUNOTHERAPY ALERT CARD at check-in to the Emergency Department and triage nurse.  Should you have questions after your visit or need to cancel or reschedule your appointment, please contact [facility] [phone]  and follow the prompts.  Office hours are 8:00 a.m. to 4:30 p.m. Monday - Friday. Please note that voicemails left after 4:00 p.m. may not be returned until the following business day.  We are closed weekends and major holidays. You have access to a nurse at all times for urgent questions. Please call the main number to the clinic [phone] and follow the prompts.  For any non-urgent questions, you may also contact your provider using MyChart. We now offer e-Visits for anyone 15 and older to request care online for non-urgent symptoms. For details visit [email or web address].   Also download the MyChart app! Go to the app store, search "MyChart", open the app, select [place], and log in with your MyChart username and password.

## 2023-04-29 NOTE — Patient Instructions (Addendum)
[facility] **VISIT SUMMARY & IMPORTANT INSTRUCTIONS **   You were seen today by Rojelio Brenner PA-C for your chemotherapy follow-up and symptom management visit.    NAUSEA & VOMITING Take olanzapine 10 mg every night at bedtime Take Compazine 10 mg every 6 hours.   Use Zofran (ondansetron) 4 mg orally dissolving tablets as needed for breakthrough nausea/vomiting.  Stop using the scopolamine patch to see how you do.   DIARRHEA Continue taking Lomotil (2 tablets) four times daily for diarrhea. Take Imodium as needed if you continue to have diarrhea despite Lomotil.  DEHYDRATION & LOW ELECTROLYTES:  You need to drink at least 60 ounces of water daily. Continue magnesium oxide 400 mg twice daily. Continue 40 mEq potassium (15 mL liquid) THREE TIMES daily  WEIGHT LOSS You need to drink 3 Ensure daily - add a scoop of ice cream to your Ensure to turn it into a milkshake! Try to eat small, frequent high-calorie meals. Continue your olanzapine to 10 mg nightly to see if this will improve your appetite.  (This may also help you to sleep better at night) Increased dose of olanzapine can also cause your blood sugars to be higher. Make sure you are checking your glucose 4 times each day (before meals and at bedtime). If your glucose is higher than 250 for three tests in a row, please reach out to your endocrinologist (NP Ronny Bacon) We will refer you to speech therapy to help you learn to swallow again. Take  dexamethasone 2 mg every morning (along with a small meal) for 10 days.  This is given to improve your appetite.  WEAKNESS: Continue home health physical therapy.  Try to be as active as possible while remaining safe.  DEPRESSION:  Please start taking your Prozac (fluoxetine) 20 mg daily.  ** If you have any new or worsening symptoms throughout the duration of your cancer treatment, please do not hesitate to call the Cancer Center at  [phone].  Our triage nurse can assist with many common problems, and same-day symptom management visits are available with Rojelio Brenner PA-C as needed.  FOLLOW-UP APPOINTMENT: - IV Fluids tomorrow (Friday 04/30/2023) - Follow-up visit with Rojelio Brenner, PA-C on Friday 05/07/2023.  ** Thank you for trusting me with your healthcare!  I strive to provide all of my patients with quality care at each visit.  If you receive a survey for this visit, I would be so grateful to you for taking the time to provide feedback.  Thank you in advance!  ~ Caryn Gienger                   Dr. [name]   &   Rojelio Brenner, PA-C   - - - - - - - - - - - - - - - - - -    Thank you for choosing [facility] to provide your oncology and hematology care.  To afford each patient quality time with our provider, please arrive at least 15 minutes before your scheduled appointment time.   If you have a lab appointment with the Cancer Center please come in thru the Main Entrance and check in at the main information desk.  You need to re-schedule your appointment should you arrive 10 or more minutes late.  We strive to give you quality time with our providers, and arriving late affects you and other patients whose appointments are after yours.  Also, if you no show three or more times for appointments you may be dismissed from the clinic at the providers discretion.     Again, thank you for choosing [facility].  Our hope is that these requests will decrease the amount of time that you wait before being seen by our physicians.       _____________________________________________________________  Should you have questions after your visit to [facility], please contact our office at [phone] and follow the prompts.  Our office hours are 8:00 a.m. and 4:30 p.m. Monday - Friday.  Please note that voicemails left after 4:00 p.m. may not be  returned until the following business day.  We are closed weekends and major holidays.  You do have access to a nurse 24-7, just call the main number to the clinic [phone] and do not press any options, hold on the line and a nurse will answer the phone.    For prescription refill requests, have your pharmacy contact our office and allow 72 hours.

## 2023-04-29 NOTE — Progress Notes (Signed)
Patients port flushed without difficulty.  Good blood return noted with no bruising or swelling noted at site.  VSS. Patient remains accessed for possible treatment. Patient waiting to see Rojelio Brenner PA.

## 2023-04-29 NOTE — Progress Notes (Signed)
Nutrition Follow-up:  Patient has completed chemotherapy for colon cancer. She is planning xeloda concurrent with radiation.  Met with patient in infusion. She is receiving supportive therapy with IV fluids today. Son and daughter of patient present at visit. Patient reports continued efforts to increase oral intake, however weights continue trending down. Recalls cup of pudding, yogurt, Ensure, and ~40 ounces of water yesterday. Patient endorses dysphagia with solids. Foods feel like they are getting stuck. Patient reports intermittent vomiting due to this. Patient has not increased Ensure as recommended. Currently consuming one daily. Daughter states she was unable to find CIB powder at the grocery store.    Medications: olanzapine, dexamethasone.   Labs: K 2.9, glucose 105, albumin 2.8  Anthropometrics: Wt 221 lb 6.4 oz today decreased 3% (6 lbs) in the last 7 days - severe  5/21 - 236 lb 5/13 - 236 lb 5.3 oz 5/8 - 236 lb 12.4 oz 4/24 - 253 lb 4.9 oz    NUTRITION DIAGNOSIS: Food and nutrition related knowledge deficit continues    MALNUTRITION DIAGNOSIS: Pt meets criteria for severe malnutrition in the context of chronic disease related to colon cancer as evidenced by intake meeting <75% of needs for >1 month, 13% (32 lb) wt loss in 6 weeks which is severe for time frame   INTERVENTION:  Reinforced education on strategies for increasing calories and protein with small frequent meals/snacks. Pt agreeable to eating within first hour of waking, followed by bites/sips q2h Pt will continue efforts to increase Ensure to 3/day - suggested pouring in juice glass, drinking 4oz at a time Recommend SLP evaluation given dysphagia - severe muscle wasting could be contributing to decreased swallowing function. Monitor for diet recommendation Pt will start dexamethasone taper + continue olanzapine for appetite Pt is receiving HHPT - encouraged high calorie/high protein snack before and high protein  snack after therapy - shake recipes given Sample packets of CIB powder provided - educated to mix with one cup whole milk for alternate ONS Continue anti-diarrheals as needed per MD If wt loss and poor oral intake continue, recommend placement of feeding tube. This was discussed with pt and family    MONITORING, EVALUATION, GOAL: weight trends, intake   NEXT VISIT: Thursday June 20 via telephone

## 2023-04-30 ENCOUNTER — Other Ambulatory Visit: Payer: Self-pay

## 2023-04-30 ENCOUNTER — Inpatient Hospital Stay: Payer: Medicare HMO

## 2023-04-30 ENCOUNTER — Other Ambulatory Visit (HOSPITAL_COMMUNITY): Payer: Self-pay | Admitting: Occupational Therapy

## 2023-04-30 ENCOUNTER — Ambulatory Visit
Admission: RE | Admit: 2023-04-30 | Discharge: 2023-04-30 | Disposition: A | Discharge status: Home / Self Care | Payer: Medicare HMO | Source: Ambulatory Visit | Attending: Radiation Oncology | Admitting: Radiation Oncology

## 2023-04-30 VITALS — BP 111/80 | HR 66 | Temp 96.6°F | Resp 18

## 2023-04-30 DIAGNOSIS — R1312 Dysphagia, oropharyngeal phase: Secondary | ICD-10-CM

## 2023-04-30 DIAGNOSIS — C801 Malignant (primary) neoplasm, unspecified: Secondary | ICD-10-CM

## 2023-04-30 DIAGNOSIS — D509 Iron deficiency anemia, unspecified: Secondary | ICD-10-CM

## 2023-04-30 DIAGNOSIS — Z510 Encounter for antineoplastic radiation therapy: Secondary | ICD-10-CM | POA: Diagnosis not present

## 2023-04-30 MED ORDER — HEPARIN SOD (PORK) LOCK FLUSH 100 UNIT/ML IV SOLN
500.0000 [IU] | Freq: Once | INTRAVENOUS | Status: AC | PRN
  Administered 2023-04-30: 500 [IU]

## 2023-04-30 MED ORDER — SODIUM CHLORIDE 0.9% FLUSH
10.0000 mL | Freq: Once | INTRAVENOUS | Status: AC | PRN
  Administered 2023-04-30: 10 mL

## 2023-04-30 MED ORDER — POTASSIUM CHLORIDE IN NACL 20-0.9 MEQ/L-% IV SOLN
Freq: Once | INTRAVENOUS | Status: AC
  Filled 2023-04-30: qty 1000

## 2023-04-30 NOTE — Patient Instructions (Signed)
[facility]  Discharge Instructions: Thank you for choosing [facility] to provide your oncology and hematology care.  If you have a lab appointment with the Cancer Center - please note that after April 8th, 2024, all labs will be drawn in the cancer center.  You do not have to check in or register with the main entrance as you have in the past but will complete your check-in in the cancer center.  Wear comfortable clothing and clothing appropriate for easy access to any Portacath or PICC line.   We strive to give you quality time with your provider. You may need to reschedule your appointment if you arrive late (15 or more minutes).  Arriving late affects you and other patients whose appointments are after yours.  Also, if you miss three or more appointments without notifying the office, you may be dismissed from the clinic at the provider's discretion.      For prescription refill requests, have your pharmacy contact our office and allow 72 hours for refills to be completed.    Today you received the following of IV potassium and 1 L of Normal Saline. Return as scheduled.   To help prevent nausea and vomiting after your treatment, we encourage you to take your nausea medication as directed.  BELOW ARE SYMPTOMS THAT SHOULD BE REPORTED IMMEDIATELY: *FEVER GREATER THAN 100.4 F (38 C) OR HIGHER *CHILLS OR SWEATING *NAUSEA AND VOMITING THAT IS NOT CONTROLLED WITH YOUR NAUSEA MEDICATION *UNUSUAL SHORTNESS OF BREATH *UNUSUAL BRUISING OR BLEEDING *URINARY PROBLEMS (pain or burning when urinating, or frequent urination) *BOWEL PROBLEMS (unusual diarrhea, constipation, pain near the anus) TENDERNESS IN MOUTH AND THROAT WITH OR WITHOUT PRESENCE OF ULCERS (sore throat, sores in mouth, or a toothache) UNUSUAL RASH, SWELLING OR PAIN  UNUSUAL VAGINAL DISCHARGE OR ITCHING   Items with * indicate a potential emergency and should be followed up as soon as possible  or go to the Emergency Department if any problems should occur.  Please show the CHEMOTHERAPY ALERT CARD or IMMUNOTHERAPY ALERT CARD at check-in to the Emergency Department and triage nurse.  Should you have questions after your visit or need to cancel or reschedule your appointment, please contact [facility] [phone]  and follow the prompts.  Office hours are 8:00 a.m. to 4:30 p.m. Monday - Friday. Please note that voicemails left after 4:00 p.m. may not be returned until the following business day.  We are closed weekends and major holidays. You have access to a nurse at all times for urgent questions. Please call the main number to the clinic [phone] and follow the prompts.  For any non-urgent questions, you may also contact your provider using MyChart. We now offer e-Visits for anyone 31 and older to request care online for non-urgent symptoms. For details visit [email or web address].   Also download the MyChart app! Go to the app store, search "MyChart", open the app, select [place], and log in with your MyChart username and password.

## 2023-04-30 NOTE — Progress Notes (Signed)
Patient presents today for hydration/potassium therapy. Patient tolerated therapy with no complaints voiced. Side effects with management reviewed with understanding verbalized. Port site clean and dry with no bruising or swelling noted at site. Good blood return noted before and after administration of therapy. Band aid applied. Patient left in satisfactory condition with VSS and no s/s of distress noted.

## 2023-05-03 ENCOUNTER — Encounter: Payer: Self-pay | Admitting: Hematology

## 2023-05-03 ENCOUNTER — Other Ambulatory Visit: Payer: Self-pay

## 2023-05-03 DIAGNOSIS — C20 Malignant neoplasm of rectum: Secondary | ICD-10-CM

## 2023-05-07 ENCOUNTER — Other Ambulatory Visit: Payer: Self-pay

## 2023-05-07 ENCOUNTER — Other Ambulatory Visit: Payer: Self-pay | Admitting: Physician Assistant

## 2023-05-07 ENCOUNTER — Inpatient Hospital Stay (HOSPITAL_BASED_OUTPATIENT_CLINIC_OR_DEPARTMENT_OTHER): Payer: Medicare HMO | Admitting: Physician Assistant

## 2023-05-07 ENCOUNTER — Inpatient Hospital Stay: Payer: Medicare HMO

## 2023-05-07 VITALS — BP 118/63 | HR 100 | Temp 97.0°F | Resp 19 | Ht 67.0 in | Wt 223.0 lb

## 2023-05-07 DIAGNOSIS — G893 Neoplasm related pain (acute) (chronic): Secondary | ICD-10-CM

## 2023-05-07 DIAGNOSIS — C20 Malignant neoplasm of rectum: Secondary | ICD-10-CM | POA: Diagnosis not present

## 2023-05-07 DIAGNOSIS — Z510 Encounter for antineoplastic radiation therapy: Secondary | ICD-10-CM | POA: Diagnosis not present

## 2023-05-07 DIAGNOSIS — R64 Cachexia: Secondary | ICD-10-CM | POA: Diagnosis not present

## 2023-05-07 DIAGNOSIS — R531 Weakness: Secondary | ICD-10-CM

## 2023-05-07 DIAGNOSIS — E876 Hypokalemia: Secondary | ICD-10-CM

## 2023-05-07 DIAGNOSIS — T451X5A Adverse effect of antineoplastic and immunosuppressive drugs, initial encounter: Secondary | ICD-10-CM

## 2023-05-07 DIAGNOSIS — R112 Nausea with vomiting, unspecified: Secondary | ICD-10-CM

## 2023-05-07 DIAGNOSIS — Z95828 Presence of other vascular implants and grafts: Secondary | ICD-10-CM

## 2023-05-07 DIAGNOSIS — K521 Toxic gastroenteritis and colitis: Secondary | ICD-10-CM

## 2023-05-07 DIAGNOSIS — D509 Iron deficiency anemia, unspecified: Secondary | ICD-10-CM

## 2023-05-07 LAB — CBC WITH DIFFERENTIAL/PLATELET
Abs Immature Granulocytes: 0.01 10*3/uL (ref 0.00–0.07)
Basophils Absolute: 0 10*3/uL (ref 0.0–0.1)
Basophils Relative: 1 %
Eosinophils Absolute: 0.1 10*3/uL (ref 0.0–0.5)
Eosinophils Relative: 2 %
HCT: 39.3 % (ref 36.0–46.0)
Hemoglobin: 12 g/dL (ref 12.0–15.0)
Immature Granulocytes: 0 %
Lymphocytes Relative: 30 %
Lymphs Abs: 1.4 10*3/uL (ref 0.7–4.0)
MCH: 30.4 pg (ref 26.0–34.0)
MCHC: 30.5 g/dL (ref 30.0–36.0)
MCV: 99.5 fL (ref 80.0–100.0)
Monocytes Absolute: 0.4 10*3/uL (ref 0.1–1.0)
Monocytes Relative: 9 %
Neutro Abs: 2.6 10*3/uL (ref 1.7–7.7)
Neutrophils Relative %: 58 %
Platelets: 146 10*3/uL — ABNORMAL LOW (ref 150–400)
RBC: 3.95 MIL/uL (ref 3.87–5.11)
RDW: 19.3 % — ABNORMAL HIGH (ref 11.5–15.5)
WBC: 4.5 10*3/uL (ref 4.0–10.5)
nRBC: 0 % (ref 0.0–0.2)

## 2023-05-07 LAB — COMPREHENSIVE METABOLIC PANEL
ALT: 17 U/L (ref 0–44)
AST: 41 U/L (ref 15–41)
Albumin: 2.7 g/dL — ABNORMAL LOW (ref 3.5–5.0)
Alkaline Phosphatase: 102 U/L (ref 38–126)
Anion gap: 10 (ref 5–15)
BUN: 7 mg/dL — ABNORMAL LOW (ref 8–23)
CO2: 24 mmol/L (ref 22–32)
Calcium: 8 mg/dL — ABNORMAL LOW (ref 8.9–10.3)
Chloride: 102 mmol/L (ref 98–111)
Creatinine, Ser: 0.61 mg/dL (ref 0.44–1.00)
GFR, Estimated: >60 mL/min (ref >60)
Glucose, Bld: 103 mg/dL — ABNORMAL HIGH (ref 70–99)
Potassium: 2.9 mmol/L — ABNORMAL LOW (ref 3.5–5.1)
Sodium: 136 mmol/L (ref 135–145)
Total Bilirubin: 0.9 mg/dL (ref 0.3–1.2)
Total Protein: 5.7 g/dL — ABNORMAL LOW (ref 6.5–8.1)

## 2023-05-07 LAB — MAGNESIUM: Magnesium: 1.5 mg/dL — ABNORMAL LOW (ref 1.7–2.4)

## 2023-05-07 MED ORDER — MAGNESIUM SULFATE 2 GM/50ML IV SOLN
2.0000 g | Freq: Once | INTRAVENOUS | Status: AC
  Administered 2023-05-07: 2 g via INTRAVENOUS
  Filled 2023-05-07: qty 50

## 2023-05-07 MED ORDER — POTASSIUM CHLORIDE 10 MEQ/100ML IV SOLN
10.0000 meq | INTRAVENOUS | Status: AC
  Administered 2023-05-07 (×2): 10 meq via INTRAVENOUS
  Filled 2023-05-07 (×2): qty 100

## 2023-05-07 MED ORDER — SODIUM CHLORIDE 0.9% FLUSH: 10.0000 mL | Freq: Once | INTRAVENOUS | Status: DC

## 2023-05-07 MED ORDER — HEPARIN SOD (PORK) LOCK FLUSH 100 UNIT/ML IV SOLN
500.0000 [IU] | Freq: Once | INTRAVENOUS | Status: AC | PRN
  Administered 2023-05-07: 500 [IU]

## 2023-05-07 MED ORDER — POTASSIUM CHLORIDE IN NACL 20-0.9 MEQ/L-% IV SOLN
Freq: Once | INTRAVENOUS | Status: AC
  Filled 2023-05-07: qty 1000

## 2023-05-07 MED ORDER — SODIUM CHLORIDE 0.9% FLUSH
10.0000 mL | Freq: Once | INTRAVENOUS | Status: AC | PRN
  Administered 2023-05-07: 10 mL

## 2023-05-07 MED ORDER — ONDANSETRON HCL 4 MG/2ML IJ SOLN
8.0000 mg | Freq: Once | INTRAMUSCULAR | Status: AC
  Administered 2023-05-07: 8 mg via INTRAVENOUS
  Filled 2023-05-07: qty 4

## 2023-05-07 MED ORDER — SODIUM CHLORIDE 0.9% FLUSH
10.0000 mL | Freq: Once | INTRAVENOUS | Status: AC
  Administered 2023-05-07: 10 mL via INTRAVENOUS

## 2023-05-07 MED ORDER — SODIUM CHLORIDE 0.9 % IV SOLN: 8.0000 mg | Freq: Once | INTRAVENOUS | Status: DC

## 2023-05-07 NOTE — Progress Notes (Signed)
Patient presents today for possible fluids per providers order.  Per PA patient will start house fluids and then will be adjusted when labs result.  Per PA order patient received a total 40 mEq IV potassium, 4 grams IV magnesium, and 1 liter of saline.  Stable during infusion without adverse affects.  Vital signs stable.  No complaints at this time.  Discharge from clinic via wheelchair in stable condition.  Alert and oriented X 3.  Follow up with [facility] as scheduled.

## 2023-05-07 NOTE — Patient Instructions (Signed)
[facility] [place]  Discharge Instructions: Thank you for choosing [facility] to provide your oncology and hematology care.  If you have a lab appointment with the Cancer Center - please note that after April 8th, 2024, all labs will be drawn in the cancer center.  You do not have to check in or register with the main entrance as you have in the past but will complete your check-in in the cancer center.  Wear comfortable clothing and clothing appropriate for easy access to any Portacath or PICC line.   We strive to give you quality time with your provider. You may need to reschedule your appointment if you arrive late (15 or more minutes).  Arriving late affects you and other patients whose appointments are after yours.  Also, if you miss three or more appointments without notifying the office, you may be dismissed from the clinic at the provider's discretion.      For prescription refill requests, have your pharmacy contact our office and allow 72 hours for refills to be completed.    Today you received the following chemotherapy and/or immunotherapy agents Fluids      To help prevent nausea and vomiting after your treatment, we encourage you to take your nausea medication as directed.  BELOW ARE SYMPTOMS THAT SHOULD BE REPORTED IMMEDIATELY: *FEVER GREATER THAN 100.4 F (38 C) OR HIGHER *CHILLS OR SWEATING *NAUSEA AND VOMITING THAT IS NOT CONTROLLED WITH YOUR NAUSEA MEDICATION *UNUSUAL SHORTNESS OF BREATH *UNUSUAL BRUISING OR BLEEDING *URINARY PROBLEMS (pain or burning when urinating, or frequent urination) *BOWEL PROBLEMS (unusual diarrhea, constipation, pain near the anus) TENDERNESS IN MOUTH AND THROAT WITH OR WITHOUT PRESENCE OF ULCERS (sore throat, sores in mouth, or a toothache) UNUSUAL RASH, SWELLING OR PAIN  UNUSUAL VAGINAL DISCHARGE OR ITCHING   Items with * indicate a potential emergency and should be followed up as soon as possible or go to the  Emergency Department if any problems should occur.  Please show the CHEMOTHERAPY ALERT CARD or IMMUNOTHERAPY ALERT CARD at check-in to the Emergency Department and triage nurse.  Should you have questions after your visit or need to cancel or reschedule your appointment, please contact [facility] [place] [phone]  and follow the prompts.  Office hours are 8:00 a.m. to 4:30 p.m. Monday - Friday. Please note that voicemails left after 4:00 p.m. may not be returned until the following business day.  We are closed weekends and major holidays. You have access to a nurse at all times for urgent questions. Please call the main number to the clinic [phone] and follow the prompts.  For any non-urgent questions, you may also contact your provider using MyChart. We now offer e-Visits for anyone 18 and older to request care online for non-urgent symptoms. For details visit [email or web address].   Also download the MyChart app! Go to the app store, search "MyChart", open the app, select [place], and log in with your MyChart username and password.   

## 2023-05-07 NOTE — Patient Instructions (Addendum)
[facility] at [facility] **VISIT SUMMARY & IMPORTANT INSTRUCTIONS **   You were seen today by Rojelio Brenner PA-C for your chemotherapy follow-up and symptom management visit.    CANCER TREATMENT:  You will start radiation treatment on Monday, 05/10/2023 If you continue to have severe weakness, you can hold off on starting your Xeloda (chemotherapy pill) until your next appointment (05/13/2023). If you start to feel better over the weekend, you should start taking 2 tablets of Xeloda (1000 mg) twice a day.  NAUSEA & VOMITING Take olanzapine 10 mg every night at bedtime Take Compazine 10 mg every 6 hours.   Use Zofran (ondansetron) 4 mg orally dissolving tablets as needed for breakthrough nausea/vomiting, no more than once every 8 hours.    DIARRHEA Continue taking Lomotil (2 tablets) four times daily for diarrhea. Take Imodium as needed if you continue to have diarrhea despite Lomotil.  DEHYDRATION & LOW ELECTROLYTES:  You need to drink at least 60 ounces of water daily. Continue magnesium oxide 400 mg twice daily. Continue 40 mEq potassium (15 mL liquid) THREE TIMES daily  WEIGHT LOSS You need to drink 3 Ensure daily - add a scoop of ice cream to your Ensure to turn it into a milkshake! Try to eat small, frequent high-calorie meals. Continue your olanzapine to 10 mg nightly to see if this will improve your appetite.  (This may also help you to sleep better at night) Increased dose of olanzapine can also cause your blood sugars to be higher. Make sure you are checking your glucose 4 times each day (before meals and at bedtime). If your glucose is higher than 250 for three tests in a row, please reach out to your endocrinologist (NP Ronny Bacon) We will refer you to speech therapy to help you learn to swallow again. Take dexamethasone 2 mg every morning (along with a small meal - a few bites of food and some Ensure will be fine!) for 10 days.  This is  given to improve your appetite and decrease your nausea.  WEAKNESS: Continue home health physical therapy.  Try to be as active as possible while remaining safe.  DEPRESSION:  Please start taking your Prozac (fluoxetine) 20 mg daily.  ** If you have any new or worsening symptoms throughout the duration of your cancer treatment, please do not hesitate to call the Cancer Center at [phone].  Our triage nurse can assist with many common problems, and same-day symptom management visits are available with Rojelio Brenner PA-C as needed.  FOLLOW-UP APPOINTMENT: - Follow-up visit with Rojelio Brenner, PA-C on Thursday 05/13/2023.  ** Thank you for trusting me with your healthcare!  I strive to provide all of my patients with quality care at each visit.  If you receive a survey for this visit, I would be so grateful to you for taking the time to provide feedback.  Thank you in advance!  ~ Zebulon Gantt                   Dr. [name]   &   Rojelio Brenner, PA-C   - - - - - - - - - - - - - - - - - -    Thank you for choosing [facility] at [facility] to provide your oncology and hematology care.  To afford each patient quality time with our provider, please arrive at least 15 minutes before your scheduled appointment time.   If you  have a lab appointment with the Cancer Center please come in thru the Main Entrance and check in at the main information desk.  You need to re-schedule your appointment should you arrive 10 or more minutes late.  We strive to give you quality time with our providers, and arriving late affects you and other patients whose appointments are after yours.  Also, if you no show three or more times for appointments you may be dismissed from the clinic at the providers discretion.     Again, thank you for choosing [facility].  Our hope is that these requests will decrease the amount of time that you wait before being seen by our  physicians.       _____________________________________________________________  Should you have questions after your visit to [facility], please contact our office at [phone] and follow the prompts.  Our office hours are 8:00 a.m. and 4:30 p.m. Monday - Friday.  Please note that voicemails left after 4:00 p.m. may not be returned until the following business day.  We are closed weekends and major holidays.  You do have access to a nurse 24-7, just call the main number to the clinic [phone] and do not press any options, hold on the line and a nurse will answer the phone.    For prescription refill requests, have your pharmacy contact our office and allow 72 hours.

## 2023-05-07 NOTE — Progress Notes (Addendum)
[name] [address] [place] [address] Phone: [phone] Fax: [phone]  SYMPTOM MANAGEMENT CLINIC PROGRESS NOTE   [name] [id number] 14-Apr-1956 67 y.o.  [name] is managed by Dr. Ellin Saba for her stage IIc rectal cancer   Actively treated with chemotherapy/immunotherapy/hormonal therapy: YES   Current therapy: FOLFIRINOX   Last treated: Cycle #6 started 03/31/2023 with pump D/C and Udenyca on 04/02/2023  INTERVAL HISTORY:  Chief Complaint: Chemotherapy follow-up & symptom management visit  At today's visit, [name] reports feeling minimally improved in the month since her last chemotherapy treatment.  She continues to report severe weakness and fatigue and is requiring assistance to stand.  She has not fallen this week.  She continues to receive home health physical therapy.  Her weight today is 223 pounds, which is increased 2 pounds from her visit last week!  She has been able to "force herself to eat a few bites here and there" throughout the week, but overall oral intake remains suboptimal.  She will drink approximately 1 Ensure daily, as well as 40 ounces of water.  She continues to have intermittent nausea without vomiting.  Nausea was no worse after starting scopolamine, and she continues to take olanzapine, Compazine, and Zofran.  She has not yet started dexamethasone.  She has about 2 episodes of diarrhea daily and remains on Lomotil 4 times each day.  She has not yet restarted her Prozac, but reports improved mood and that she is feeling "more hopeful."  She has intermittent lower pelvic pain, relieved with hydrocodone, which she is taking at night.   She denies any rectal bleeding, mouth sores, rashes, peripheral edema, chest pain, shortness of breath, dyspnea, or worsening of her chronic diabetic peripheral neuropathy. She is compliant with taking potassium 40 mEq 3 times daily as well as magnesium twice  daily.  ASSESSMENT & PLAN:  ## STAGE IIC RECTAL CANCER - Primary oncologist is Dr. Ellin Saba - Patient is on treatment plan FOLFIRINOX q. 14 days x 8 cycles - C6/D1 of FOLFIRINOX on 03/31/2023 with pump D/C on 04/02/2023 - She received Udenyca on 04/02/2023 - Per note by Dr. Ellin Saba on 04/14/2023, patient had adequate response to IV chemotherapy seen on recent MRI pelvis, therefore chemotherapy was discontinued after cycle #6. - Patient is moving onto next phase of treatment with radiation, Xeloda, and surgery.   - She is seeing radiation [facility] in [facility] with [place] (Dr. Mitzi Hansen) - Her surgical oncologist is Dr. Maisie Fus. - PLAN: She is scheduled to start taking her capecitabine (Xeloda) on 05/10/2023, on the same day that she starts radiation therapy.  HOWEVER, per discussion with Dr. Ellin Saba, she can HOLD OFF ON STARTING CAPECITABINE (XELODA) due to ongoing weakness and poor oral intake.  If she starts to feel better, she can take 2 pills Xeloda twice daily.  Otherwise, we will reevaluate her next week to see if she can start her Xeloda at that time. - I will see her in symptom management visit next Thursday (05/13/2023) - She is scheduled for follow-up with Dr. Ellin Saba (05/19/2023) 1 week after she starts radiation therapy.   # Cancer cachexia # Weight loss # Protein calorie malnutrition - Severely decreased appetite and very poor oral intake, even 1 month after completion of IV chemotherapy - No improvement on Megace.  Unable to obtain Marinol due to national shortage. - Mounjaro, metformin, and NovoLog were discontinued by endocrinology. - Weight today is 223 pounds, which is increased  2 pounds since last week.  Overall, she has lost 15 pounds in the past month alone. - PLAN: Continue olanzapine 10 mg nightly.  Continue close follow-up with nutritionist. - Patient instructed to start previously prescribed dexamethasone 2 mg each morning with breakfast. - Endocrinologist  notified about increased olanzapine causing potential blood sugar disturbances.  Instructions given to patient. - Will refer to speech therapy, as she may have some head/neck muscle wasting that is affecting her ability to swallow. - Per nutritionist, goal is at least 3 Ensure daily. - We discussed that she may require a feeding tube if she has continued poor oral intake and weight loss  # Diarrhea - Diarrhea has improved now that she is taking Lomotil 2 tablets 4 times daily.  She will have 1-2 loose bowel movements in the morning, but these resolve after Imodium. - PLAN: Continue Lomotil 2 tablets Q.I.D. with prn Imodium  # Nausea and vomiting - Refractory chemo-induced nausea/vomiting despite Compazine and Zofran - Slightly improved after starting olanzapine 10 mg nightly - No change from scopolamine patch, therefore discontinued - PLAN: Continue olanzapine nightly (increased to 10 mg to help w/ appetite stimulation, as above) - Continue Compazine every 6-8 hours with Zofran 8 mg ODT TID PRN breakthrough nausea/vomiting. - Recommend dexamethasone 2 mg each morning with breakfast, as above.  # Insomnia - PLAN: Olanzapine increased to 10 mg nightly for treatment of cachexia and nausea.  If she continues to have significant insomnia, would consider adding low-dose temazepam.  # Dehydration - Secondary to poor oral intake and diarrhea - PLAN: 1000 mL NS given in clinic, patient to return next week for additional IV fluids.  # Physical deconditioning # Generalized weakness with fall at home - Patient had fall at home x 1 (fell backwards onto bed, no injuries reported) - She has significant progressive weakness and muscle loss related to the above - PLAN: Continue home health Physical Therapy  # Hypokalemia # Hypomagnesemia - She is taking magnesium 400 mg twice daily and potassium 40 mEq THREE times daily - Continues to have hypokalemia and hypomagnesemia despite oral repletion, likely  secondary to GI losses - Labs today (05/07/2023) with hypomagnesemia 1.5, and hypokalemia 2.9. - PLAN: Continue magnesium 400 mg twice daily - Continue oral potassium repletion to 40 mEq THREE TIMES daily - IV repletion today with 40 mEq IV potassium and 4 g IV magnesium.  # Depression - Patient had stopped taking her Prozac due to her own worry that it might interact with her chemotherapy. - Recommended that patient restart her fluoxetine 20 mg daily, but she has not yet done this - PLAN: Instructed patient to RESTART fluoxetine 20 mg  # Chemotherapy-induced pancytopenia, RESOLVED  - CBC today WBC 4.5, platelets 146, Hgb 12.0 - Patient does also have underlying liver cirrhosis, but had previously had normal WBCs and platelets. - White blood counts have improved after she was started on Udenyca (last dose given 04/02/2023) - She has some aspect of iron deficiency anemia secondary to rectal bleeding as well, and received IV Venofer 400 mg x 3 (most recently on 02/23/2023).  Most recent iron levels did not show any evidence of deficiency.  # Diabetic peripheral neuropathy - She has no feeling in her toes.  No worsening since oxaliplatin started, but reports onset of cold sensitivity after second cycle of chemotherapy.   - PLAN: Continue Lyrica twice daily.    # Pelvic pain -  Intermittent pelvic pain, none today. - She has  prescription for hydrocodone 5 mg every 4-6 hours as needed for severe pain.  # Folic acid deficiency - PLAN: Continue folic acid 1 mg tablet daily  PLAN SUMMARY: >> Symptom management visit next week (Thursday, 05/13/2023) with labs and possible IV fluids. >> Next visit with Dr. Ellin Saba on 05/19/2023    REVIEW OF SYSTEMS:  Review of Systems  Constitutional:  Positive for activity change, appetite change, fatigue and unexpected weight change. Negative for chills, diaphoresis and fever.  HENT:  Positive for trouble swallowing (pills). Negative for mouth sores,  nosebleeds and sore throat.   Respiratory:  Negative for cough and shortness of breath.   Cardiovascular:  Negative for chest pain, palpitations and leg swelling.  Gastrointestinal:  Positive for diarrhea and nausea. Negative for abdominal pain, blood in stool, constipation and vomiting.  Genitourinary:  Negative for dysuria and hematuria.  Neurological:  Positive for dizziness, weakness and light-headedness. Negative for numbness and headaches.  Psychiatric/Behavioral:  Positive for dysphoric mood and sleep disturbance. The patient is nervous/anxious.     Past Medical History, Surgical history, Social history, and Family history were reviewed as documented elsewhere in chart, and were updated as appropriate.   OBJECTIVE:   Physical Exam:  There were no vitals taken for this visit. ECOG: 3  Physical Exam Constitutional:      Appearance: Normal appearance. She is morbidly obese.     Comments: Weak appearing on exam  Cardiovascular:     Rate and Rhythm: Tachycardia present.     Heart sounds: Murmur heard.  Pulmonary:     Breath sounds: Normal breath sounds.  Musculoskeletal:     Right lower leg: Edema (1+) present.     Left lower leg: Edema (1+) present.  Skin:    General: Skin is warm and dry.     Coloration: Skin is pale.     Comments: Poor turgor, dry  Neurological:     General: No focal deficit present.     Mental Status: Mental status is at baseline.  Psychiatric:        Behavior: Behavior normal. Behavior is cooperative.    Lab Review:     Component Value Date/Time   NA 136 04/29/2023 1350   NA 139 09/03/2022 1305   K 2.9 (L) 04/29/2023 1350   CL 103 04/29/2023 1350   CO2 22 04/29/2023 1350   GLUCOSE 105 (H) 04/29/2023 1350   BUN 10 04/29/2023 1350   BUN 8 09/03/2022 1305   CREATININE 0.71 04/29/2023 1350   CREATININE 0.84 01/26/2023 1503   CALCIUM 8.3 (L) 04/29/2023 1350   PROT 5.9 (L) 04/29/2023 1350   PROT 6.6 09/03/2022 1305   ALBUMIN 2.8 (L) 04/29/2023  1350   ALBUMIN 3.7 (L) 09/03/2022 1305   AST 45 (H) 04/29/2023 1350   ALT 18 04/29/2023 1350   ALKPHOS 103 04/29/2023 1350   BILITOT 1.5 (H) 04/29/2023 1350   BILITOT 0.3 09/03/2022 1305   GFRNONAA >60 04/29/2023 1350   GFRNONAA 78 12/14/2019 0848   GFRAA 81 12/11/2020 0908   GFRAA 90 12/14/2019 0848       Component Value Date/Time   WBC 5.4 04/29/2023 1350   RBC 3.84 (L) 04/29/2023 1350   HGB 11.6 (L) 04/29/2023 1350   HGB 8.6 (L) 11/26/2022 1343   HCT 37.7 04/29/2023 1350   HCT 30.5 (L) 11/26/2022 1343   PLT 163 04/29/2023 1350   PLT 199 11/26/2022 1343   MCV 98.2 04/29/2023 1350   MCV 76 (L) 11/26/2022 1343  MCH 30.2 04/29/2023 1350   MCHC 30.8 04/29/2023 1350   RDW 22.5 (H) 04/29/2023 1350   RDW 16.6 (H) 11/26/2022 1343   LYMPHSABS 1.6 04/29/2023 1350   MONOABS 0.5 04/29/2023 1350   EOSABS 0.0 04/29/2023 1350   BASOSABS 0.1 04/29/2023 1350   -------------------------------  Imaging from last 24 hours (if applicable): Radiology interpretation: MR PELVIS WO CM RECTAL CA STAGING  Result Date: 04/14/2023 CLINICAL DATA:  Rectal cancer, status post chemotherapy and immunotherapy . EXAM: MRI PELVIS WITHOUT CONTRAST TECHNIQUE: Multiplanar multisequence MR imaging of the pelvis was performed. No intravenous contrast was administered. Ultrasound gel was administered per rectum to optimize tumor evaluation. COMPARISON:  12/24/2022 MRI.  PET of 12/31/2022 FINDINGS: TUMOR LOCATION Tumor distance from Anal Verge/Skin surface: 6.1 cm on 17/3 Tumor distance to Internal Anal sphincter: 2.4 cm on 15/6 TUMOR DESCRIPTION Circumferential extent: 180 degrees, centered about the 4 o'clock position on 19/11 Tumor Size and volume: 3.6 x 2.1 x 3.0 cm (volume = 12 cm^3)on 19/11 and 16/12. Compare 35 cc on the prior. T - CATEGORY Extension through [employer]:T3c-at the 2 o'clock position, decreased macroscopic tumor extends to contact the mesorectal fascia including at 1.4 x 1.4 cm in 20/11.  Decreased from 2.4 x 2.0 cm on the prior. Residual restricted diffusion including 924/10. Shortest Distance of any tumor/node from Mesorectal fascia: Not applicable. Contact again identified with the mesorectal fascia. Extramural Vascular Invasion/Tumor Thrombus: No Invasion of Anterior Peritoneal Reflection: No Involvement of Adjacent Organs or Pelvic Sidewall: Equivocal-decreased soft tissue extending towards the left vaginal apex and pelvic floor musculature including on 24/4. Levator Ani Involvement: No N - CATEGORY Mesorectal Lymph Nodes >=84mm: None=N0 Extra-mesorectal Lymphadenopathy: No Other: Poor flow laxity. Hysterectomy. No significant free fluid. Small fat containing right inguinal hernia. IMPRESSION: Rectal adenocarcinoma T stage: T3c Rectal adenocarcinoma N stage:  N0 Distance from tumor to the internal anal sphincter is 2.4 cm. Electronically Signed   By: Jeronimo Greaves M.D.   On: 04/14/2023 10:29      WRAP UP:  All questions were answered. The patient knows to call the clinic with any problems, questions or concerns.  Medical decision making: Moderate  Time spent on visit: I spent 30 minutes counseling the patient face to face. The total time spent in the appointment was 45 minutes and more than 50% was on counseling.  Carnella Guadalajara, PA-C  05/07/23 12:01 PM

## 2023-05-10 ENCOUNTER — Ambulatory Visit
Admission: RE | Admit: 2023-05-10 | Discharge: 2023-05-10 | Disposition: A | Discharge status: Home / Self Care | Payer: Medicare HMO | Source: Ambulatory Visit | Attending: Radiation Oncology | Admitting: Radiation Oncology

## 2023-05-10 ENCOUNTER — Other Ambulatory Visit: Payer: Self-pay

## 2023-05-10 DIAGNOSIS — Z510 Encounter for antineoplastic radiation therapy: Secondary | ICD-10-CM | POA: Diagnosis not present

## 2023-05-10 LAB — RAD ONC ARIA SESSION SUMMARY
Course Elapsed Days: 0
Plan Fractions Treated to Date: 1
Plan Prescribed Dose Per Fraction: 1.8 Gy
Plan Total Fractions Prescribed: 25
Plan Total Prescribed Dose: 45 Gy
Reference Point Dosage Given to Date: 1.8 Gy
Reference Point Session Dosage Given: 1.8 Gy
Session Number: 1

## 2023-05-11 ENCOUNTER — Ambulatory Visit
Admission: RE | Admit: 2023-05-11 | Discharge: 2023-05-11 | Disposition: A | Discharge status: Home / Self Care | Payer: Medicare HMO | Source: Ambulatory Visit | Attending: Radiation Oncology | Admitting: Radiation Oncology

## 2023-05-11 ENCOUNTER — Other Ambulatory Visit: Payer: Self-pay

## 2023-05-11 DIAGNOSIS — Z510 Encounter for antineoplastic radiation therapy: Secondary | ICD-10-CM | POA: Diagnosis not present

## 2023-05-11 LAB — RAD ONC ARIA SESSION SUMMARY
Course Elapsed Days: 1
Plan Fractions Treated to Date: 2
Plan Prescribed Dose Per Fraction: 1.8 Gy
Plan Total Fractions Prescribed: 25
Plan Total Prescribed Dose: 45 Gy
Reference Point Dosage Given to Date: 3.6 Gy
Reference Point Session Dosage Given: 1.8 Gy
Session Number: 2

## 2023-05-12 ENCOUNTER — Ambulatory Visit
Admission: RE | Admit: 2023-05-12 | Discharge: 2023-05-12 | Disposition: A | Discharge status: Home / Self Care | Payer: Medicare HMO | Source: Ambulatory Visit | Attending: Radiation Oncology | Admitting: Radiation Oncology

## 2023-05-12 ENCOUNTER — Other Ambulatory Visit: Payer: Self-pay

## 2023-05-12 DIAGNOSIS — Z510 Encounter for antineoplastic radiation therapy: Secondary | ICD-10-CM | POA: Diagnosis not present

## 2023-05-12 LAB — RAD ONC ARIA SESSION SUMMARY
Course Elapsed Days: 2
Plan Fractions Treated to Date: 3
Plan Prescribed Dose Per Fraction: 1.8 Gy
Plan Total Fractions Prescribed: 25
Plan Total Prescribed Dose: 45 Gy
Reference Point Dosage Given to Date: 5.4 Gy
Reference Point Session Dosage Given: 1.8 Gy
Session Number: 3

## 2023-05-12 NOTE — Progress Notes (Unsigned)
[facility] [address], [place] [address] Phone: [phone] Fax: [phone]  SYMPTOM MANAGEMENT CLINIC PROGRESS NOTE   GENOVEVA PU [id number] 1956-02-21 67 y.o.  [name] is managed by Dr. Ellin Saba for her stage IIc rectal cancer   Actively treated with chemotherapy/immunotherapy/hormonal therapy: YES   Current therapy: FOLFIRINOX   Last treated: Cycle #6 started 03/31/2023 with pump D/C and Udenyca on 04/02/2023  INTERVAL HISTORY:  Chief Complaint: Chemotherapy follow-up & symptom management visit  Ms. Sperl was last seen in clinic by Rojelio Brenner PA-C last week (05/07/2023).  In the interim, she has started radiation therapy, which she is tolerating fairly well.  She denies any radiation related concerns at this time.  She does report ongoing weakness.  She fell on Sunday and sustained skin skin tear to her right arm.  Since then she reports that her weakness has gotten worse and she is having extreme difficulty even standing and walking a few steps.  She continues to receive home health physical therapy.  Thankfully, she is starting to eat again this week, approximately 30% of normal solid food diet.  She is drinking Ensure once a day as well as approximately 40 ounces of water.  She continues to have some mild nausea, but does not have any vomiting.  She felt improved after starting her dexamethasone.  Her weight today is 229 pounds, which is up 6 pounds in the past week.  She has not had any diarrhea for the past 3 days, and reports that her stool has become solid and more formed.  She has restarted her Prozac.  She has intermittent lower pelvic pain, relieved with hydrocodone, which she is taking at night.   She denies any rectal bleeding, mouth sores, rashes, peripheral edema, chest pain, shortness of breath, dyspnea, or worsening of her chronic diabetic peripheral neuropathy. She is compliant with taking potassium 40 mEq 3 times  daily as well as magnesium twice daily.  ASSESSMENT & PLAN:  ## STAGE IIC RECTAL CANCER - Primary oncologist is Dr. Ellin Saba - Patient is on treatment plan FOLFIRINOX q. 14 days x 8 cycles - C6/D1 of FOLFIRINOX on 03/31/2023 with pump D/C on 04/02/2023 - She received Udenyca on 04/02/2023 - Per note by Dr. Ellin Saba on 04/14/2023, patient had adequate response to IV chemotherapy seen on recent MRI pelvis, therefore chemotherapy was discontinued after cycle #6. - Patient is moving onto next phase of treatment with radiation, Xeloda, and surgery.   - She is seeing radiation oncology in [facility] with [place] (Dr. Mitzi Hansen) - Her surgical oncologist is Dr. Maisie Fus. - PLAN: She is scheduled to start taking her capecitabine (Xeloda) on 05/10/2023, on the same day that she starts radiation therapy.  HOWEVER, per discussion with Dr. Ellin Saba, she should continue to HOLD OFF ON STARTING CAPECITABINE (XELODA) due to ongoing weakness.  He will reevaluate this at her MD visit on 05/19/2023.   # Cancer cachexia # Weight loss # Protein calorie malnutrition - Starting to eat again this week, approximately 30% of normal solid food diet. - Drinking Ensure once a day as well as approximately 40 ounces of water. - Weight today is 229 pounds, which is up 6 pounds in the past week.   - Mounjaro, metformin, and NovoLog were discontinued by endocrinology. - Felt some improved appetite since she started taking her dexamethasone.  (Megace was ineffective.  Marinol unavailable due to Sport and exercise psychologist.) - Weight today is 223 pounds, which is increased  2 pounds since last week.  Overall, she has lost 15 pounds in the past month alone. - PLAN: Continue olanzapine 10 mg nightly.  Continue close follow-up with nutritionist. - Continue dexamethasone 2 mg each morning with breakfast until 10-day course is finished.  If she has recurrent symptoms after completion of steroid course, we will prescribe another 10 days. -  Endocrinologist notified about increased olanzapine causing potential blood sugar disturbances.  Instructions given to patient. - Per nutritionist, goal is at least 3 Ensure daily.  # Hypokalemia # Hypomagnesemia - She is taking magnesium 400 mg twice daily and potassium 40 mEq THREE times daily - Continues to have hypokalemia and hypomagnesemia despite oral repletion, likely secondary to GI losses - Labs today (05/13/2023) with hypomagnesemia 1.3, and hypokalemia 2.8. - PLAN: Continue magnesium 400 mg twice daily.  Continue oral potassium repletion to 40 mEq THREE TIMES daily - IV repletion today with 40 mEq IV potassium and 4 g IV magnesium.   RTC tomorrow for electrolyte recheck and possible IV fluids/electrolytes if needed.  # Diarrhea - Diarrhea has improved - She has still been taking Lomotil 2 tablets 4 times daily with as needed Imodium - PLAN: Patient instructed to STOP Lomotil now that she is having normal stool.  If she has diarrhea, recommend Imodium as needed.  If she has severe diarrhea refractory to Imodium, we will restart Lomotil.  # Nausea and vomiting - Refractory chemo-induced nausea/vomiting despite Compazine and Zofran - Slightly improved after starting olanzapine 10 mg nightly and dexamethasone 2 mg daily - No change from scopolamine patch, therefore discontinued - PLAN: Continue olanzapine nightly (increased to 10 mg to help w/ appetite stimulation, as above) - Continue Compazine every 6-8 hours with Zofran 8 mg ODT TID PRN breakthrough nausea/vomiting. -Continue dexamethasone 2 mg each morning with breakfast, as above.  # Insomnia - PLAN: Olanzapine increased to 10 mg nightly for treatment of cachexia and nausea.  If she continues to have significant insomnia, would consider adding low-dose temazepam.  # Dehydration - Secondary to poor oral intake and diarrhea - PLAN: 1000 mL NS given in clinic  # Physical deconditioning # Generalized weakness with fall at  home - Patient had fall at home x 1 (fell backwards onto bed, no injuries reported) - She has significant progressive weakness and muscle loss related to the above - PLAN: Continue home health Physical Therapy  # Depression - She is taking fluoxetine 20 mg daily  # Diabetic peripheral neuropathy - She has no feeling in her toes.  No worsening since oxaliplatin started, but reports onset of cold sensitivity after second cycle of chemotherapy.   - PLAN: Continue Lyrica twice daily.    # Pelvic pain -  Intermittent pelvic pain, none today. - She has prescription for hydrocodone 5 mg every 4-6 hours as needed for severe pain.  # Folic acid deficiency - PLAN: Continue folic acid 1 mg tablet daily  PLAN SUMMARY: >> Labs and possible IV fluids tomorrow >> Next visit with Dr. Ellin Saba on 05/19/2023    REVIEW OF SYSTEMS:  Review of Systems  Constitutional:  Positive for activity change, appetite change, fatigue and unexpected weight change. Negative for chills, diaphoresis and fever.  HENT:  Negative for mouth sores, nosebleeds, sore throat and trouble swallowing (pills).   Respiratory:  Negative for cough and shortness of breath.   Cardiovascular:  Negative for chest pain, palpitations and leg swelling.  Gastrointestinal:  Positive for nausea. Negative for abdominal pain, blood in  stool, constipation, diarrhea and vomiting.  Genitourinary:  Negative for dysuria and hematuria.  Neurological:  Positive for dizziness, weakness and light-headedness. Negative for numbness and headaches.  Psychiatric/Behavioral:  Positive for dysphoric mood and sleep disturbance. The patient is nervous/anxious.     Past Medical History, Surgical history, Social history, and Family history were reviewed as documented elsewhere in chart, and were updated as appropriate.   OBJECTIVE:   Physical Exam:  There were no vitals taken for this visit. ECOG: 3  Physical Exam Constitutional:      Appearance:  Normal appearance. She is morbidly obese.     Comments: Weak appearing on exam  Cardiovascular:     Rate and Rhythm: Tachycardia present.     Heart sounds: Murmur heard.  Pulmonary:     Breath sounds: Normal breath sounds.  Musculoskeletal:     Right lower leg: Edema (1+) present.     Left lower leg: Edema (1+) present.  Skin:    General: Skin is warm and dry.     Coloration: Skin is pale.     Comments: Poor turgor, dry  Neurological:     General: No focal deficit present.     Mental Status: Mental status is at baseline.  Psychiatric:        Behavior: Behavior normal. Behavior is cooperative.    Lab Review:     Component Value Date/Time   NA 136 05/07/2023 0941   NA 139 09/03/2022 1305   K 2.9 (L) 05/07/2023 0941   CL 102 05/07/2023 0941   CO2 24 05/07/2023 0941   GLUCOSE 103 (H) 05/07/2023 0941   BUN 7 (L) 05/07/2023 0941   BUN 8 09/03/2022 1305   CREATININE 0.61 05/07/2023 0941   CREATININE 0.84 01/26/2023 1503   CALCIUM 8.0 (L) 05/07/2023 0941   PROT 5.7 (L) 05/07/2023 0941   PROT 6.6 09/03/2022 1305   ALBUMIN 2.7 (L) 05/07/2023 0941   ALBUMIN 3.7 (L) 09/03/2022 1305   AST 41 05/07/2023 0941   ALT 17 05/07/2023 0941   ALKPHOS 102 05/07/2023 0941   BILITOT 0.9 05/07/2023 0941   BILITOT 0.3 09/03/2022 1305   GFRNONAA >60 05/07/2023 0941   GFRNONAA 78 12/14/2019 0848   GFRAA 81 12/11/2020 0908   GFRAA 90 12/14/2019 0848       Component Value Date/Time   WBC 4.5 05/07/2023 0941   RBC 3.95 05/07/2023 0941   HGB 12.0 05/07/2023 0941   HGB 8.6 (L) 11/26/2022 1343   HCT 39.3 05/07/2023 0941   HCT 30.5 (L) 11/26/2022 1343   PLT 146 (L) 05/07/2023 0941   PLT 199 11/26/2022 1343   MCV 99.5 05/07/2023 0941   MCV 76 (L) 11/26/2022 1343   MCH 30.4 05/07/2023 0941   MCHC 30.5 05/07/2023 0941   RDW 19.3 (H) 05/07/2023 0941   RDW 16.6 (H) 11/26/2022 1343   LYMPHSABS 1.4 05/07/2023 0941   MONOABS 0.4 05/07/2023 0941   EOSABS 0.1 05/07/2023 0941   BASOSABS 0.0  05/07/2023 0941   -------------------------------  Imaging from last 24 hours (if applicable): Radiology interpretation: No results found.    WRAP UP:  All questions were answered. The patient knows to call the clinic with any problems, questions or concerns.  Medical decision making: Moderate  Time spent on visit: I spent 30 minutes counseling the patient face to face. The total time spent in the appointment was 45 minutes and more than 50% was on counseling.  Carnella Guadalajara, PA-C  05/13/23 10:06 AM

## 2023-05-13 ENCOUNTER — Inpatient Hospital Stay: Payer: Medicare HMO

## 2023-05-13 ENCOUNTER — Ambulatory Visit
Admission: RE | Admit: 2023-05-13 | Discharge: 2023-05-13 | Disposition: A | Discharge status: Home / Self Care | Payer: Medicare HMO | Source: Ambulatory Visit | Attending: Radiation Oncology | Admitting: Radiation Oncology

## 2023-05-13 ENCOUNTER — Inpatient Hospital Stay (HOSPITAL_BASED_OUTPATIENT_CLINIC_OR_DEPARTMENT_OTHER): Payer: Medicare HMO | Admitting: Physician Assistant

## 2023-05-13 ENCOUNTER — Inpatient Hospital Stay: Payer: Medicare HMO | Admitting: Dietician

## 2023-05-13 ENCOUNTER — Other Ambulatory Visit: Payer: Self-pay

## 2023-05-13 VITALS — BP 114/64 | HR 113 | Temp 96.3°F | Resp 20

## 2023-05-13 VITALS — BP 114/64 | HR 113 | Temp 96.3°F | Resp 20 | Wt 229.0 lb

## 2023-05-13 DIAGNOSIS — C20 Malignant neoplasm of rectum: Secondary | ICD-10-CM | POA: Diagnosis not present

## 2023-05-13 DIAGNOSIS — T451X5A Adverse effect of antineoplastic and immunosuppressive drugs, initial encounter: Secondary | ICD-10-CM

## 2023-05-13 DIAGNOSIS — D509 Iron deficiency anemia, unspecified: Secondary | ICD-10-CM

## 2023-05-13 DIAGNOSIS — F32A Depression, unspecified: Secondary | ICD-10-CM

## 2023-05-13 DIAGNOSIS — R64 Cachexia: Secondary | ICD-10-CM

## 2023-05-13 DIAGNOSIS — E876 Hypokalemia: Secondary | ICD-10-CM

## 2023-05-13 DIAGNOSIS — F419 Anxiety disorder, unspecified: Secondary | ICD-10-CM

## 2023-05-13 DIAGNOSIS — R531 Weakness: Secondary | ICD-10-CM

## 2023-05-13 DIAGNOSIS — Z510 Encounter for antineoplastic radiation therapy: Secondary | ICD-10-CM | POA: Diagnosis not present

## 2023-05-13 DIAGNOSIS — Z95828 Presence of other vascular implants and grafts: Secondary | ICD-10-CM

## 2023-05-13 DIAGNOSIS — R112 Nausea with vomiting, unspecified: Secondary | ICD-10-CM

## 2023-05-13 LAB — COMPREHENSIVE METABOLIC PANEL
ALT: 19 U/L (ref 0–44)
AST: 46 U/L — ABNORMAL HIGH (ref 15–41)
Albumin: 2.4 g/dL — ABNORMAL LOW (ref 3.5–5.0)
Alkaline Phosphatase: 106 U/L (ref 38–126)
Anion gap: 12 (ref 5–15)
BUN: 9 mg/dL (ref 8–23)
CO2: 23 mmol/L (ref 22–32)
Calcium: 8.1 mg/dL — ABNORMAL LOW (ref 8.9–10.3)
Chloride: 102 mmol/L (ref 98–111)
Creatinine, Ser: 0.66 mg/dL (ref 0.44–1.00)
GFR, Estimated: >60 mL/min (ref >60)
Glucose, Bld: 124 mg/dL — ABNORMAL HIGH (ref 70–99)
Potassium: 2.8 mmol/L — ABNORMAL LOW (ref 3.5–5.1)
Sodium: 137 mmol/L (ref 135–145)
Total Bilirubin: 0.9 mg/dL (ref 0.3–1.2)
Total Protein: 5.4 g/dL — ABNORMAL LOW (ref 6.5–8.1)

## 2023-05-13 LAB — CBC WITH DIFFERENTIAL/PLATELET
Abs Immature Granulocytes: 0.01 10*3/uL (ref 0.00–0.07)
Basophils Absolute: 0 10*3/uL (ref 0.0–0.1)
Basophils Relative: 1 %
Eosinophils Absolute: 0.1 10*3/uL (ref 0.0–0.5)
Eosinophils Relative: 3 %
HCT: 37.2 % (ref 36.0–46.0)
Hemoglobin: 11.3 g/dL — ABNORMAL LOW (ref 12.0–15.0)
Immature Granulocytes: 0 %
Lymphocytes Relative: 32 %
Lymphs Abs: 1.2 10*3/uL (ref 0.7–4.0)
MCH: 30.6 pg (ref 26.0–34.0)
MCHC: 30.4 g/dL (ref 30.0–36.0)
MCV: 100.8 fL — ABNORMAL HIGH (ref 80.0–100.0)
Monocytes Absolute: 0.3 10*3/uL (ref 0.1–1.0)
Monocytes Relative: 7 %
Neutro Abs: 2.2 10*3/uL (ref 1.7–7.7)
Neutrophils Relative %: 57 %
Platelets: 117 10*3/uL — ABNORMAL LOW (ref 150–400)
RBC: 3.69 MIL/uL — ABNORMAL LOW (ref 3.87–5.11)
RDW: 18.2 % — ABNORMAL HIGH (ref 11.5–15.5)
WBC: 3.9 10*3/uL — ABNORMAL LOW (ref 4.0–10.5)
nRBC: 0 % (ref 0.0–0.2)

## 2023-05-13 LAB — RAD ONC ARIA SESSION SUMMARY
Course Elapsed Days: 3
Plan Fractions Treated to Date: 4
Plan Prescribed Dose Per Fraction: 1.8 Gy
Plan Total Fractions Prescribed: 25
Plan Total Prescribed Dose: 45 Gy
Reference Point Dosage Given to Date: 7.2 Gy
Reference Point Session Dosage Given: 1.8 Gy
Session Number: 4

## 2023-05-13 LAB — SAMPLE TO BLOOD BANK

## 2023-05-13 LAB — MAGNESIUM: Magnesium: 1.3 mg/dL — ABNORMAL LOW (ref 1.7–2.4)

## 2023-05-13 MED ORDER — MAGNESIUM SULFATE 2 GM/50ML IV SOLN: 2.0000 g | Freq: Once | INTRAVENOUS | Status: DC

## 2023-05-13 MED ORDER — HEPARIN SOD (PORK) LOCK FLUSH 100 UNIT/ML IV SOLN
500.0000 [IU] | Freq: Once | INTRAVENOUS | Status: AC
  Administered 2023-05-13: 500 [IU] via INTRAVENOUS

## 2023-05-13 MED ORDER — POTASSIUM CHLORIDE IN NACL 40-0.9 MEQ/L-% IV SOLN
Freq: Once | INTRAVENOUS | Status: AC
  Filled 2023-05-13: qty 1000

## 2023-05-13 MED ORDER — SODIUM CHLORIDE 0.9% FLUSH
10.0000 mL | INTRAVENOUS | Status: AC
  Administered 2023-05-13: 10 mL

## 2023-05-13 MED ORDER — SODIUM CHLORIDE 0.9% FLUSH
10.0000 mL | INTRAVENOUS | Status: DC | PRN
  Administered 2023-05-13: 10 mL via INTRAVENOUS

## 2023-05-13 MED ORDER — MAGNESIUM SULFATE 2 GM/50ML IV SOLN
2.0000 g | Freq: Once | INTRAVENOUS | Status: AC
  Administered 2023-05-13: 2 g via INTRAVENOUS
  Filled 2023-05-13: qty 50

## 2023-05-13 NOTE — Progress Notes (Signed)
Patient presents today for IVF.  Patient is c/o fatigue/weakness.  Vital signs are stable.  Labs reviewed by Rojelio Brenner, PA-C.  Magnesium today is 1.3 and potassium is 2.8.  Patient will receive magnesium sulfate 4 grams and potassium chloride 40 mEq in 1 L of NS over 2 hours today per PA. We will proceed with fluids per provider orders.   Patient tolerated IVF well with no complaints voiced.  Patient left via wheelchair with grandson in stable condition.  Vital signs stable at discharge.  Follow up as scheduled.

## 2023-05-13 NOTE — Progress Notes (Signed)
Nutrition Follow-up:  Patient has completed chemotherapy for colon cancer. She is currently receiving radiation under the care of Dr. Mitzi Hansen (start 6/17) Concurrent Xeloda on hold due to ongoing weakness.   Met with patient in infusion. She is receiving supportive therapy with IVF. Patient with flat affect during visit. She reports appetite is getting better. Recalls broccoli, meatloaf, mashed potatoes, mac/cheese, ice cream. Patient is drinking one Ensure. She has been working to increase water. Reports 40 ounces daily. Patient denies constipation, diarrhea, nausea, vomiting. She continues to work with PT 2 days/week at home. Patient endorses another fall on Sunday. States she misjudged and fell out bed. She endorses minor skin abrasions to right arm. This is wrapped at visit today.     Medications: reviewed   Labs: K 2.8, albumin 2.4  Anthropometrics: Wt 229 lb increased from 221 lb 6.4 oz on 6/6   NUTRITION DIAGNOSIS: Food and nutrition related knowledge deficit continues    MALNUTRITION DIAGNOSIS: Severe malnutrition continues    INTERVENTION:  Reinforced education on strategies for increasing calories and protein with small frequent meals/snacks  Continue dexamethasone taper + olanzapine for appetite Pt receiving HHPT twice weekly - encouraged high calorie/high protein snack before and high protein snack after therapy  Continue drinking Ensure - ongoing encouragement to increase to 3/day for added calories/protein    MONITORING, EVALUATION, GOAL: weight trends, intake   NEXT VISIT: Thursday July 18 via telephone

## 2023-05-13 NOTE — Patient Instructions (Signed)
[facility] [place]  Discharge Instructions: Thank you for choosing [facility] to provide your oncology and hematology care.  If you have a lab appointment with the Cancer Center - please note that after April 8th, 2024, all labs will be drawn in the cancer center.  You do not have to check in or register with the main entrance as you have in the past but will complete your check-in in the cancer center.  Wear comfortable clothing and clothing appropriate for easy access to any Portacath or PICC line.   We strive to give you quality time with your provider. You may need to reschedule your appointment if you arrive late (15 or more minutes).  Arriving late affects you and other patients whose appointments are after yours.  Also, if you miss three or more appointments without notifying the office, you may be dismissed from the clinic at the provider's discretion.      For prescription refill requests, have your pharmacy contact our office and allow 72 hours for refills to be completed.  To help prevent nausea and vomiting after your treatment, we encourage you to take your nausea medication as directed.  BELOW ARE SYMPTOMS THAT SHOULD BE REPORTED IMMEDIATELY: *FEVER GREATER THAN 100.4 F (38 C) OR HIGHER *CHILLS OR SWEATING *NAUSEA AND VOMITING THAT IS NOT CONTROLLED WITH YOUR NAUSEA MEDICATION *UNUSUAL SHORTNESS OF BREATH *UNUSUAL BRUISING OR BLEEDING *URINARY PROBLEMS (pain or burning when urinating, or frequent urination) *BOWEL PROBLEMS (unusual diarrhea, constipation, pain near the anus) TENDERNESS IN MOUTH AND THROAT WITH OR WITHOUT PRESENCE OF ULCERS (sore throat, sores in mouth, or a toothache) UNUSUAL RASH, SWELLING OR PAIN  UNUSUAL VAGINAL DISCHARGE OR ITCHING   Items with * indicate a potential emergency and should be followed up as soon as possible or go to the Emergency Department if any problems should occur.  Please show the CHEMOTHERAPY ALERT CARD or  IMMUNOTHERAPY ALERT CARD at check-in to the Emergency Department and triage nurse.  Should you have questions after your visit or need to cancel or reschedule your appointment, please contact [facility] [place] [phone]  and follow the prompts.  Office hours are 8:00 a.m. to 4:30 p.m. Monday - Friday. Please note that voicemails left after 4:00 p.m. may not be returned until the following business day.  We are closed weekends and major holidays. You have access to a nurse at all times for urgent questions. Please call the main number to the clinic [phone] and follow the prompts.  For any non-urgent questions, you may also contact your provider using MyChart. We now offer e-Visits for anyone 18 and older to request care online for non-urgent symptoms. For details visit [email or web address].   Also download the MyChart app! Go to the app store, search "MyChart", open the app, select [place], and log in with your MyChart username and password.   

## 2023-05-13 NOTE — Patient Instructions (Signed)
[facility] **VISIT SUMMARY & IMPORTANT INSTRUCTIONS **   You were seen today by Carrie Brenner PA-C for your chemotherapy follow-up and symptom management visit.    CANCER TREATMENT:  Due to your ongoing severe weakness, you can hold off on starting your Xeloda (chemotherapy pill) until Dr. Ellin Saba sees you on Wednesday, 05/19/2023.    NAUSEA & VOMITING CONTINUE take olanzapine 10 mg every night at bedtime CONTINUE take Compazine 10 mg every 6 hours.   CONTINUE to use Zofran (ondansetron) 4 mg orally dissolving tablets as needed for breakthrough nausea/vomiting, no more than once every 8 hours.    DIARRHEA STOP taking Lomotil (2 tablets) since your diarrhea has resolved. You can take Imodium as needed if you start to have diarrhea again. If you have a severe diarrhea (more than 4 episodes in a day), you can restart Lomotil again.  DEHYDRATION & LOW ELECTROLYTES:  You need to drink at least 60 ounces of water daily. Continue magnesium oxide 400 mg twice daily. Continue 40 mEq potassium (15 mL liquid) THREE TIMES daily  WEIGHT LOSS You need to drink 3 Ensure daily - add a scoop of ice cream to your Ensure to turn it into a milkshake! Try to eat small, frequent high-calorie meals. Continue your olanzapine to 10 mg nightly to see if this will improve your appetite.  (This may also help you to sleep better at night) Increased dose of olanzapine can also cause your blood sugars to be higher. Make sure you are checking your glucose 4 times each day (before meals and at bedtime). If your glucose is higher than 250 for three tests in a row, please reach out to your endocrinologist (NP Ronny Bacon) Continue to take dexamethasone 2 mg every morning (along with a small meal - a few bites of food and some Ensure will be fine!) for 10 days.  This is given to improve your appetite and decrease your nausea. Once you have finished your 10 days of  dexamethasone, let us know if you start to feel worse again.  If so, we will prescribe additional dexamethasone steroid.  WEAKNESS: Continue home health physical therapy.  Try to be as active as possible while remaining safe.  DEPRESSION: Continue your Prozac (fluoxetine) 20 mg daily.  ** If you have any new or worsening symptoms throughout the duration of your cancer treatment, please do not hesitate to call the Cancer Center at [phone].  Our triage nurse can assist with many common problems, and same-day symptom management visits are available with Carrie Brenner PA-C as needed.  FOLLOW-UP APPOINTMENT: - Labs and IV fluids on Friday, 05/14/2023 - Office visit with Dr. Ellin Saba on Wednesday, 05/19/2023  ** Thank you for trusting me with your healthcare!  I strive to provide all of my patients with quality care at each visit.  If you receive a survey for this visit, I would be so grateful to you for taking the time to provide feedback.  Thank you in advance!  ~ Cheney Ewart                   Dr. [name]   &   Carrie Brenner, PA-C   - - - - - - - - - - - - - - - - - -    Thank you for choosing [facility] at [facility] to provide your oncology and hematology care.  To afford each patient quality time with  our provider, please arrive at least 15 minutes before your scheduled appointment time.   If you have a lab appointment with the Cancer Center please come in thru the Main Entrance and check in at the main information desk.  You need to re-schedule your appointment should you arrive 10 or more minutes late.  We strive to give you quality time with our providers, and arriving late affects you and other patients whose appointments are after yours.  Also, if you no show three or more times for appointments you may be dismissed from the clinic at the providers discretion.     Again, thank you for choosing [facility].  Our hope is that these  requests will decrease the amount of time that you wait before being seen by our physicians.       _____________________________________________________________  Should you have questions after your visit to [facility], please contact our office at [phone] and follow the prompts.  Our office hours are 8:00 a.m. and 4:30 p.m. Monday - Friday.  Please note that voicemails left after 4:00 p.m. may not be returned until the following business day.  We are closed weekends and major holidays.  You do have access to a nurse 24-7, just call the main number to the clinic [phone] and do not press any options, hold on the line and a nurse will answer the phone.    For prescription refill requests, have your pharmacy contact our office and allow 72 hours.

## 2023-05-13 NOTE — Progress Notes (Signed)
Patients port flushed without difficulty.  Good blood return noted with no bruising or swelling noted at site.  VSS. Patient remains accessed for treatment.  

## 2023-05-14 ENCOUNTER — Inpatient Hospital Stay: Payer: Medicare HMO

## 2023-05-14 ENCOUNTER — Ambulatory Visit
Admission: RE | Admit: 2023-05-14 | Discharge: 2023-05-14 | Disposition: A | Discharge status: Home / Self Care | Payer: Medicare HMO | Source: Ambulatory Visit | Attending: Radiation Oncology | Admitting: Radiation Oncology

## 2023-05-14 ENCOUNTER — Other Ambulatory Visit: Payer: Self-pay

## 2023-05-14 VITALS — BP 106/77 | HR 119 | Temp 97.6°F | Resp 20

## 2023-05-14 VITALS — BP 101/72 | HR 100 | Temp 97.3°F | Resp 18

## 2023-05-14 DIAGNOSIS — G893 Neoplasm related pain (acute) (chronic): Secondary | ICD-10-CM

## 2023-05-14 DIAGNOSIS — D509 Iron deficiency anemia, unspecified: Secondary | ICD-10-CM

## 2023-05-14 DIAGNOSIS — Z510 Encounter for antineoplastic radiation therapy: Secondary | ICD-10-CM | POA: Diagnosis not present

## 2023-05-14 DIAGNOSIS — C20 Malignant neoplasm of rectum: Secondary | ICD-10-CM

## 2023-05-14 LAB — BASIC METABOLIC PANEL - CANCER CENTER ONLY
Anion gap: 10 (ref 5–15)
BUN: 9 mg/dL (ref 8–23)
CO2: 23 mmol/L (ref 22–32)
Calcium: 8.3 mg/dL — ABNORMAL LOW (ref 8.9–10.3)
Chloride: 105 mmol/L (ref 98–111)
Creatinine: 0.7 mg/dL (ref 0.44–1.00)
GFR, Estimated: >60 mL/min (ref >60)
Glucose, Bld: 133 mg/dL — ABNORMAL HIGH (ref 70–99)
Potassium: 3.3 mmol/L — ABNORMAL LOW (ref 3.5–5.1)
Sodium: 138 mmol/L (ref 135–145)

## 2023-05-14 LAB — RAD ONC ARIA SESSION SUMMARY
Course Elapsed Days: 4
Plan Fractions Treated to Date: 5
Plan Prescribed Dose Per Fraction: 1.8 Gy
Plan Total Fractions Prescribed: 25
Plan Total Prescribed Dose: 45 Gy
Reference Point Dosage Given to Date: 9 Gy
Reference Point Session Dosage Given: 1.8 Gy
Session Number: 5

## 2023-05-14 LAB — MAGNESIUM: Magnesium: 1.7 mg/dL (ref 1.7–2.4)

## 2023-05-14 MED ORDER — SODIUM CHLORIDE 0.9% FLUSH
10.0000 mL | Freq: Once | INTRAVENOUS | Status: AC | PRN
  Administered 2023-05-14: 10 mL

## 2023-05-14 MED ORDER — SONAFINE EX EMUL
1.0000 | Freq: Once | CUTANEOUS | Status: AC
  Administered 2023-05-14: 1 via TOPICAL

## 2023-05-14 MED ORDER — SODIUM CHLORIDE 0.9% FLUSH
10.0000 mL | INTRAVENOUS | Status: DC | PRN
  Administered 2023-05-14: 10 mL via INTRAVENOUS

## 2023-05-14 MED ORDER — MAGNESIUM SULFATE 2 GM/50ML IV SOLN
2.0000 g | Freq: Once | INTRAVENOUS | Status: AC
  Administered 2023-05-14: 2 g via INTRAVENOUS
  Filled 2023-05-14: qty 50

## 2023-05-14 MED ORDER — HEPARIN SOD (PORK) LOCK FLUSH 100 UNIT/ML IV SOLN
500.0000 [IU] | Freq: Once | INTRAVENOUS | Status: AC | PRN
  Administered 2023-05-14: 500 [IU]

## 2023-05-14 MED ORDER — POTASSIUM CHLORIDE IN NACL 20-0.9 MEQ/L-% IV SOLN
Freq: Once | INTRAVENOUS | Status: AC
  Filled 2023-05-14: qty 1000

## 2023-05-14 NOTE — Progress Notes (Signed)
Treatment given per orders. Patient tolerated it well without problems. Vitals stable and discharged home from clinic via wheelchair Follow up as scheduled.  

## 2023-05-14 NOTE — Progress Notes (Signed)
Patient presents today for infusion of Normal Saline with 20 mEq of potassium and Magnesium 2g. Patient is in satisfactory condition. Patient does report skin tear prior to coming in to right forearm and asked for dressing to be applied. Per patient hit arm causing the skin tear. Telfa dressing, with gauze, and Kerlex applied. Patient tolerated well. No further complaints or changes noted.  Vital signs are stable.  We will proceed with infusion per provider orders.

## 2023-05-14 NOTE — Patient Instructions (Signed)
[facility]  Discharge Instructions: Thank you for choosing [facility] to provide your oncology and hematology care.  If you have a lab appointment with the Cancer Center - please note that after April 8th, 2024, all labs will be drawn in the cancer center.  You do not have to check in or register with the main entrance as you have in the past but will complete your check-in in the cancer center.  Wear comfortable clothing and clothing appropriate for easy access to any Portacath or PICC line.   We strive to give you quality time with your provider. You may need to reschedule your appointment if you arrive late (15 or more minutes).  Arriving late affects you and other patients whose appointments are after yours.  Also, if you miss three or more appointments without notifying the office, you may be dismissed from the clinic at the provider's discretion.      For prescription refill requests, have your pharmacy contact our office and allow 72 hours for refills to be completed.    Today you received hydration fluids per orders   To help prevent nausea and vomiting after your treatment, we encourage you to take your nausea medication as directed.  BELOW ARE SYMPTOMS THAT SHOULD BE REPORTED IMMEDIATELY: *FEVER GREATER THAN 100.4 F (38 C) OR HIGHER *CHILLS OR SWEATING *NAUSEA AND VOMITING THAT IS NOT CONTROLLED WITH YOUR NAUSEA MEDICATION *UNUSUAL SHORTNESS OF BREATH *UNUSUAL BRUISING OR BLEEDING *URINARY PROBLEMS (pain or burning when urinating, or frequent urination) *BOWEL PROBLEMS (unusual diarrhea, constipation, pain near the anus) TENDERNESS IN MOUTH AND THROAT WITH OR WITHOUT PRESENCE OF ULCERS (sore throat, sores in mouth, or a toothache) UNUSUAL RASH, SWELLING OR PAIN  UNUSUAL VAGINAL DISCHARGE OR ITCHING   Items with * indicate a potential emergency and should be followed up as soon as possible or go to the Emergency Department if any problems  should occur.  Please show the CHEMOTHERAPY ALERT CARD or IMMUNOTHERAPY ALERT CARD at check-in to the Emergency Department and triage nurse.  Should you have questions after your visit or need to cancel or reschedule your appointment, please contact [facility] [phone]  and follow the prompts.  Office hours are 8:00 a.m. to 4:30 p.m. Monday - Friday. Please note that voicemails left after 4:00 p.m. may not be returned until the following business day.  We are closed weekends and major holidays. You have access to a nurse at all times for urgent questions. Please call the main number to the clinic [phone] and follow the prompts.  For any non-urgent questions, you may also contact your provider using MyChart. We now offer e-Visits for anyone 53 and older to request care online for non-urgent symptoms. For details visit [email or web address].   Also download the MyChart app! Go to the app store, search "MyChart", open the app, select , and log in with your MyChart username and password.

## 2023-05-17 ENCOUNTER — Other Ambulatory Visit: Payer: Self-pay

## 2023-05-17 ENCOUNTER — Ambulatory Visit
Admission: RE | Admit: 2023-05-17 | Discharge: 2023-05-17 | Disposition: A | Discharge status: Home / Self Care | Payer: Medicare HMO | Source: Ambulatory Visit | Attending: Radiation Oncology | Admitting: Radiation Oncology

## 2023-05-17 DIAGNOSIS — Z510 Encounter for antineoplastic radiation therapy: Secondary | ICD-10-CM | POA: Diagnosis not present

## 2023-05-17 DIAGNOSIS — E860 Dehydration: Secondary | ICD-10-CM

## 2023-05-17 DIAGNOSIS — E876 Hypokalemia: Secondary | ICD-10-CM

## 2023-05-17 DIAGNOSIS — C20 Malignant neoplasm of rectum: Secondary | ICD-10-CM

## 2023-05-17 LAB — RAD ONC ARIA SESSION SUMMARY
Course Elapsed Days: 7
Plan Fractions Treated to Date: 6
Plan Prescribed Dose Per Fraction: 1.8 Gy
Plan Total Fractions Prescribed: 25
Plan Total Prescribed Dose: 45 Gy
Reference Point Dosage Given to Date: 10.8 Gy
Reference Point Session Dosage Given: 1.8 Gy
Session Number: 6

## 2023-05-18 ENCOUNTER — Ambulatory Visit
Admission: RE | Admit: 2023-05-18 | Discharge: 2023-05-18 | Disposition: A | Discharge status: Home / Self Care | Payer: Medicare HMO | Source: Ambulatory Visit | Attending: Radiation Oncology | Admitting: Radiation Oncology

## 2023-05-18 ENCOUNTER — Other Ambulatory Visit: Payer: Self-pay

## 2023-05-18 DIAGNOSIS — Z510 Encounter for antineoplastic radiation therapy: Secondary | ICD-10-CM | POA: Diagnosis not present

## 2023-05-18 LAB — RAD ONC ARIA SESSION SUMMARY
Course Elapsed Days: 8
Plan Fractions Treated to Date: 7
Plan Prescribed Dose Per Fraction: 1.8 Gy
Plan Total Fractions Prescribed: 25
Plan Total Prescribed Dose: 45 Gy
Reference Point Dosage Given to Date: 12.6 Gy
Reference Point Session Dosage Given: 1.8 Gy
Session Number: 7

## 2023-05-18 NOTE — Progress Notes (Signed)
[facility] [address], [place] [address]    Clinic Day:  05/19/2023  Referring physician: Gwenlyn Found, MD  Patient Care Team: Gwenlyn Found, MD as PCP - General (Family Medicine) Jonelle Sidle, MD as PCP - Cardiology (Cardiology) Jena Gauss Gerrit Friends, MD as Consulting Physician (Gastroenterology) [name], MD as Medical Oncologist (Medical Oncology) Therese Sarah, RN as Oncology Nurse Navigator (Medical Oncology)   ASSESSMENT & PLAN:   Assessment: 1.  Stage IIc (T3d/4b N0 M0) rectal cancer: - Rectal bleeding for the past 4 to 5 years. - Colonoscopy (12/16/2022): Tumor protruding through the anal orifice.  Bulky semilunar lateral rectal tumor extending from the anorectal junction proximally about 13 cm. 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  MSI-high not detected by [id number] - CT CAP (12/17/2022): Locally advanced low rectal primary without bowel obstruction or nodal metastasis.  Isolated right middle lobe subpleural lung nodule most likely benign.  Cirrhosis and portal venous hypertension.  2.6 cm right renal lesion with differential hemorrhagic/proteinaceous cyst or solid neoplasm. - PET scan (12/31/2022): Markedly hypermetabolic rectal lesion with no evidence for perirectal or pelvic lymphadenopathy.  No metastatic disease in the neck, chest, abdomen or pelvis.  Focal hypermetabolism in the region of the gallbladder neck corresponds to subtle 13 mm focus of increased attenuation in the lumen of the gallbladder.  Differential includes gallbladder polyp/neoplasm.  2.7 cm exophytic posterior right interpolar renal lesion shows no substantial hypermetabolism, likely cyst.  2.3 cm inferior right thyroid nodule without hypermetabolism. - MRI pelvis (12/24/2022): Extension through muscularis propria.  Tumor size 3.8 x 3 x 5.8 cm with a volume 35 cm.  T stage is T3d, probable T4.   Suspicion of involvement of pelvic floor musculature and the far posterior and left side of the vagina. - 6 cycles of FOLFIRINOX from 01/20/2023 through 03/31/2023 - MRI pelvis (04/12/2023): Tumor size has improved.  T3c N0. - XRT started on 05/10/2023, Xeloda started on 05/19/2023   2.  Social/family history: - [name] lives at home with her family.  [name] is accompanied by her daughter today.  [name] is independent of ADLs and IADLs.  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  Non-smoker.  [name] reports having hysterectomy in 1986 for cancerous polyps in her endometrium. - Maternal grandmother's sister had colon cancer.    Plan: 1.  Stage IIc (T3d/4BN0M0) rectal cancer, MSI stable: - [name] started XRT on 05/10/2023 and is tolerating very well. - I have reviewed her labs today: Normal creatinine and LFTs with AST elevated at 43.  CBC grossly normal.  Recommend that [name] start taking Xeloda 1000 mg twice daily starting tonight.  I will reevaluate her in 1 week.  If [name] tolerates well, increase to 1500 mg twice daily.   2.  Peripheral neuropathy from diabetes: - [name] has difficulty feeling her toes.  No worsening since chemo started.  [name] is not on Lyrica.   3.  Severe microcytic anemia: - Rectal bleeding has resolved.  Hemoglobin is stable at 11.5.   4.  Hypokalemia: - [name] is taking liquid potassium 40 mEq 3 times daily.  Will increase it to 4 times daily as her potassium today is 2.8.   5.  Hypomagnesemia: - [name] is taking magnesium twice daily.  Magnesium is low at 1.3.  [name] will receive IV magnesium.  Will increase magnesium to 800 mg twice daily.   6.  Diarrhea: -  [name] has 4 watery stools per day.  [name] is taking Imodium 2 tablets at the first onset followed by 1 tablet after each watery bowel movement, averaging 5/day.    Orders Placed This Encounter  Procedures   CBC with Differential    Standing Status:   Standing    Number of Occurrences:   10    Standing Expiration Date:   05/18/2024    Comprehensive metabolic panel    Standing Status:   Standing    Number of Occurrences:   10    Standing Expiration Date:   05/18/2024   Magnesium    Standing Status:   Standing    Number of Occurrences:   10    Standing Expiration Date:   05/18/2024      I,Katie Daubenspeck,acting as a scribe for [name], MD.,have documented all relevant documentation on the behalf of [name], MD,as directed by  [name], MD while in the presence of [name], MD.   I, [name] MD, have reviewed the above documentation for accuracy and completeness, and I agree with the above.   [name], MD   6/26/20244:55 PM  CHIEF COMPLAINT:   Diagnosis: stage IIc (T4b N0) low rectal cancer    Cancer Staging  Rectal cancer [facility]) Staging form: Colon and Rectum, AJCC 8th Edition - Clinical stage from 01/03/2023: Stage IIC (cT4b, cN0, cM0) - Unsigned    Prior Therapy: FOLFIRINOX   Current Therapy:  chemoradiation with Xeloda   HISTORY OF PRESENT ILLNESS:   Oncology History  Rectal cancer (HCC)  01/03/2023 Initial Diagnosis   Rectal cancer (HCC)   01/20/2023 - 04/02/2023 Chemotherapy   Patient is on Treatment Plan : RECTAL Modified FOLFIRINOX q14d x 8 cycles        INTERVAL HISTORY:   [name] is a 67 y.o. female presenting to clinic today for follow up of stage IIc (T4b N0) low rectal cancer. [name] was last seen by me on 04/14/23. [name] has also been seen by PA Rebekah in our symptom management clinic.  Today, [name] states that [name] is doing well overall. Her appetite level is at 75%. Her energy level is at 30%.  PAST MEDICAL HISTORY:   Past Medical History: Past Medical History:  Diagnosis Date   Anxiety    Cancer (HCC)    Coronary atherosclerosis    Cardiac CT 09/2018 with calcium score 8.7 and mild proximal LAD disease   Essential hypertension 2009   GERD (gastroesophageal reflux disease)    Hypothyroidism    Iron deficiency anemia     Osteoarthritis    Palpitations    Tachypalpitations on beta blockers since 2010   Type 2 diabetes mellitus (HCC) 2003   Vitamin B 12 deficiency    Vitamin D deficiency     Surgical History: Past Surgical History:  Procedure Laterality Date   ABDOMINAL HYSTERECTOMY     History of cervical cancer   BIOPSY  12/16/2022   Procedure: BIOPSY;  Surgeon: Corbin Ade, MD;  [facility];  Service: Endoscopy;;  gastric, rectal   BREAST BIOPSY Right    COLONOSCOPY WITH PROPOFOL N/A 12/16/2022   Procedure: COLONOSCOPY WITH PROPOFOL;  Surgeon: Corbin Ade, MD;  [facility];  Service: Endoscopy;  Laterality: N/A;  10:45 am   ESOPHAGOGASTRODUODENOSCOPY (EGD) WITH PROPOFOL N/A 12/16/2022   Procedure: ESOPHAGOGASTRODUODENOSCOPY (EGD) WITH PROPOFOL;  Surgeon: Corbin Ade, MD;  [facility];  Service: Endoscopy;  Laterality: N/A;  IR IMAGING GUIDED PORT INSERTION  01/15/2023   MALONEY DILATION N/A 12/16/2022   Procedure: Elease Hashimoto DILATION;  Surgeon: Corbin Ade, MD;  [facility];  Service: Endoscopy;  Laterality: N/A;   POLYPECTOMY  12/16/2022   Procedure: POLYPECTOMY;  Surgeon: Corbin Ade, MD;  [facility];  Service: Endoscopy;;   TONSILLECTOMY      Social History: Social History   Socioeconomic History   Marital status: Single    Spouse name: Not on file   Number of children: 2   Years of education: Not on file   Highest education level: Not on file  Occupational History   Occupation: DISABLED    Employer: UNEMPLOYED    Comment: [employer]  Tobacco Use   Smoking status: Never   Smokeless tobacco: Never  Vaping Use   Vaping Use: Never used  Substance and Sexual Activity   Alcohol use: No   Drug use: No   Sexual activity: Never  Other Topics Concern   Not on file  Social History Narrative   Has 2 grandchildren. 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   Social Determinants of Health    Financial Resource Strain: Not on file  Food Insecurity: No Food Insecurity (01/06/2023)   Hunger Vital Sign    Worried About Running Out of Food in the Last Year: Never true    Ran Out of Food in the Last Year: Never true  Transportation Needs: No Transportation Needs (01/06/2023)   PRAPARE - Administrator, Civil Service (Medical): No    Lack of Transportation (Non-Medical): No  Physical Activity: Not on file  Stress: Not on file  Social Connections: Not on file  Intimate Partner Violence: Not At Risk (01/06/2023)   Humiliation, Afraid, Rape, and Kick questionnaire    Fear of Current or Ex-Partner: No    Emotionally Abused: No    Physically Abused: No    Sexually Abused: No    Family History: Family History  Problem Relation Age of Onset   Heart failure Mother    Diabetes Father    Hypertension Father    Stroke Sister 56   Diabetes Brother    Cancer - Colon Other        age 76   Colon polyps Neg Hx     Current Medications:  Current Outpatient Medications:    blood glucose meter kit and supplies KIT, Dispense based on patient and insurance preference. Use up to four times daily as directed. (FOR ICD-10 E11.65) Number of Strips 400 Number of Lancets 400, Disp: 1 each, Rfl: 0   Blood Glucose Monitoring Suppl (TRUE METRIX METER) w/Device KIT, 1 each by Does not apply route 2 (two) times daily. Use to test BG bid E11.65, Disp: 1 kit, Rfl: 0   diphenoxylate-atropine (LOMOTIL) 2.5-0.025 MG tablet, Take 1 tablet by mouth 4 (four) times daily. (Patient taking differently: Take 2 tablets by mouth 4 (four) times daily.), Disp: 360 tablet, Rfl: 3   DROPLET PEN NEEDLES 31G X 5 MM MISC, USE AS INSTRUCTED TO INJECT INSULIN DAILY, Disp: 100 each, Rfl: 3   Feeding Tubes (FEEDING TUBE ATTACHMENT DEVICE) MISC, 3 in 1 Bedside commode, Disp: , Rfl:    FLUOROURACIL IV, Inject into the vein every 14 (fourteen) days., Disp: , Rfl:    FLUoxetine (PROZAC) 20 MG capsule, Take 20 mg by  mouth daily., Disp: , Rfl:    HYDROcodone-acetaminophen (NORCO/VICODIN) 5-325 MG tablet, TAKE (1) TABLET  BY MOUTH EVERY (6) HOURS AS NEEDED., Disp: 30 tablet, Rfl: 0   IRINOTECAN HCL IV, Inject into the vein every 14 (fourteen) days., Disp: , Rfl:    LEUCOVORIN CALCIUM IV, Inject into the vein every 14 (fourteen) days., Disp: , Rfl:    levothyroxine (SYNTHROID) 88 MCG tablet, TAKE 1 TABLET EVERY DAY BEFORE BREAKFAST (Patient taking differently: Take 88 mcg by mouth daily before breakfast.), Disp: 90 tablet, Rfl: 3   lidocaine (XYLOCAINE) 2 % solution, , Disp: , Rfl:    loperamide (IMODIUM) 2 MG capsule, Take 1 capsule (2 mg total) by mouth as needed for diarrhea or loose stools. TAKE 1 CAPSULE AS DIRECTED AS NEEDED FOR DIARRHEA OR LOOSE STOOLS, Disp: 30 capsule, Rfl: 11   magnesium oxide (MAG-OX) 400 (240 Mg) MG tablet, Take 2 tablets (800 mg total) by mouth daily., Disp: 120 tablet, Rfl: 3   Misc. Devices (ROLLATOR ULTRA-LIGHT) MISC, Rollator walker, Disp: , Rfl:    OLANZapine (ZYPREXA) 5 MG tablet, Take 2 tablets (10 mg total) by mouth at bedtime., Disp: 60 tablet, Rfl: 2   OXALIPLATIN IV, Inject into the vein every 14 (fourteen) days., Disp: , Rfl:    pantoprazole (PROTONIX) 40 MG tablet, Take 1 tablet (40 mg total) by mouth 2 (two) times daily before a meal., Disp: 60 tablet, Rfl: 3   Potassium Chloride 40 MEQ/15ML (20%) SOLN, Take 15 mLs by mouth 3 (three) times daily., Disp: 473 mL, Rfl: 0   TRUE METRIX BLOOD GLUCOSE TEST test strip, TEST BLOOD SUGAR TWICE DAILY, Disp: 200 strip, Rfl: 3   ondansetron (ZOFRAN-ODT) 4 MG disintegrating tablet, Take 1 tablet (4 mg total) by mouth every 8 (eight) hours as needed for nausea or vomiting. DISSOLVE 2 TAB ON TONGUE EVERY 8HR AS NEEDED NAUSEA, VOMITING (BETWEEN COMPAZINE DOSES FOR MAX RELIEF) Strength: 4 mg, Disp: 60 tablet, Rfl: 1   prochlorperazine (COMPAZINE) 10 MG tablet, Take 1 tablet (10 mg total) by mouth every 8 (eight) hours as needed for nausea  or vomiting. TAKE 1 TABLET EVERY 6 HOURS AS NEEDED FOR NAUSEA OR VOMITING Strength: 10 mg, Disp: 30 tablet, Rfl: 3 No current facility-administered medications for this visit.  Facility-Administered Medications Ordered in Other Visits:    0.9 %  sodium chloride infusion, , Intravenous, Continuous, [name], MD, Stopped at 02/17/23 1535   Allergies: Allergies  Allergen Reactions   Contrast Media [Iodinated Contrast Media] Other (See Comments)    Burning   Sulfa Antibiotics Nausea And Vomiting    Other reaction(s): GI Upset (intolerance) unknown   Doxepin Rash and Swelling   Tape Rash    REVIEW OF SYSTEMS:   Review of Systems  Constitutional:  Negative for chills, fatigue and fever.  HENT:   Negative for lump/mass, mouth sores, nosebleeds, sore throat and trouble swallowing.   Eyes:  Negative for eye problems.  Respiratory:  Negative for cough and shortness of breath.   Cardiovascular:  Negative for chest pain, leg swelling and palpitations.  Gastrointestinal:  Positive for diarrhea and nausea. Negative for abdominal pain, constipation and vomiting.  Genitourinary:  Negative for bladder incontinence, difficulty urinating, dysuria, frequency, hematuria and nocturia.   Musculoskeletal:  Negative for arthralgias, back pain, flank pain, myalgias and neck pain.  Skin:  Negative for itching and rash.  Neurological:  Negative for dizziness, headaches and numbness.  Hematological:  Does not bruise/bleed easily.  Psychiatric/Behavioral:  Negative for depression, sleep disturbance and suicidal ideas. The patient is not nervous/anxious.   All other  systems reviewed and are negative.    VITALS:   Weight 237 lb 6.4 oz (107.7 kg).  Wt Readings from Last 3 Encounters:  05/19/23 237 lb 6.4 oz (107.7 kg)  05/13/23 229 lb (103.9 kg)  05/07/23 223 lb (101.2 kg)    Body mass index is 37.18 kg/m.  Performance status (ECOG): 1 - Symptomatic but completely ambulatory  PHYSICAL  EXAM:   Physical Exam Vitals and nursing note reviewed. Exam conducted with a chaperone present.  Constitutional:      Appearance: Normal appearance.  Cardiovascular:     Rate and Rhythm: Normal rate and regular rhythm.     Pulses: Normal pulses.     Heart sounds: Normal heart sounds.  Pulmonary:     Effort: Pulmonary effort is normal.     Breath sounds: Normal breath sounds.  Abdominal:     Palpations: Abdomen is soft. There is no hepatomegaly, splenomegaly or mass.     Tenderness: There is no abdominal tenderness.  Musculoskeletal:     Right lower leg: No edema.     Left lower leg: No edema.  Lymphadenopathy:     Cervical: No cervical adenopathy.     Right cervical: No superficial, deep or posterior cervical adenopathy.    Left cervical: No superficial, deep or posterior cervical adenopathy.     Upper Body:     Right upper body: No supraclavicular or axillary adenopathy.     Left upper body: No supraclavicular or axillary adenopathy.  Neurological:     General: No focal deficit present.     Mental Status: [name] is alert and oriented to person, place, and time.  Psychiatric:        Mood and Affect: Mood normal.        Behavior: Behavior normal.     LABS:      Latest Ref Rng & Units 05/19/2023    8:44 AM 05/13/2023    8:22 AM 05/07/2023    9:41 AM  CBC  WBC 4.0 - 10.5 K/uL 4.1  3.9  4.5   Hemoglobin 12.0 - 15.0 g/dL 16.1  09.6  04.5   Hematocrit 36.0 - 46.0 % 36.7  37.2  39.3   Platelets 150 - 400 K/uL 112  117  146       Latest Ref Rng & Units 05/19/2023    8:44 AM 05/14/2023   12:31 PM 05/13/2023    8:22 AM  CMP  Glucose 70 - 99 mg/dL 409  811  914   BUN 8 - 23 mg/dL 8  9  9    Creatinine 0.44 - 1.00 mg/dL 7.82  9.56  2.13   Sodium 135 - 145 mmol/L 136  138  137   Potassium 3.5 - 5.1 mmol/L 2.8  3.3  2.8   Chloride 98 - 111 mmol/L 103  105  102   CO2 22 - 32 mmol/L 22  23  23    Calcium 8.9 - 10.3 mg/dL 7.9  8.3  8.1   Total Protein 6.5 - 8.1 g/dL 5.6   5.4    Total Bilirubin 0.3 - 1.2 mg/dL 0.9   0.9   Alkaline Phos 38 - 126 U/L 121   106   AST 15 - 41 U/L 43   46   ALT 0 - 44 U/L 19   19      Lab Results  Component Value Date   CEA1 3.8 12/16/2022   /  CEA  Date Value Ref Range Status  12/16/2022 3.8 0.0 - 4.7 ng/mL Final    Comment:    (NOTE)                             Nonsmokers          <3.9                             Smokers             <5.6 Roche [employer] Electrochemiluminescence Immunoassay (ECLIA) Values obtained with different assay methods or kits cannot be used interchangeably.  Results cannot be interpreted as absolute evidence of the presence or absence of malignant disease. Performed At: [facility] [address] [place], [place] [id number] Jolene Schimke MD WU:[id number]    No results found for: "PSA1" No results found for: "CAN199" No results found for: "CAN125"  No results found for: "TOTALPROTELP", "ALBUMINELP", "A1GS", "A2GS", "BETS", "BETA2SER", "GAMS", "MSPIKE", "SPEI" Lab Results  Component Value Date   TIBC 157 (L) 04/09/2023   TIBC 370 01/04/2023   FERRITIN 2,274 (H) 04/09/2023   FERRITIN 4 (L) 01/04/2023   IRONPCTSAT 57 (H) 04/09/2023   IRONPCTSAT 4 (L) 01/04/2023   Lab Results  Component Value Date   LDH 136 01/04/2023     STUDIES:   No results found.

## 2023-05-19 ENCOUNTER — Ambulatory Visit (HOSPITAL_COMMUNITY): Payer: Medicare HMO

## 2023-05-19 ENCOUNTER — Inpatient Hospital Stay: Payer: Medicare HMO

## 2023-05-19 ENCOUNTER — Ambulatory Visit
Admission: RE | Admit: 2023-05-19 | Discharge: 2023-05-19 | Disposition: A | Discharge status: Home / Self Care | Payer: Medicare HMO | Source: Ambulatory Visit | Attending: Radiation Oncology | Admitting: Radiation Oncology

## 2023-05-19 ENCOUNTER — Other Ambulatory Visit: Payer: Self-pay

## 2023-05-19 ENCOUNTER — Ambulatory Visit (HOSPITAL_COMMUNITY): Payer: Medicare HMO | Attending: Speech Pathology | Admitting: Speech Pathology

## 2023-05-19 ENCOUNTER — Inpatient Hospital Stay (HOSPITAL_BASED_OUTPATIENT_CLINIC_OR_DEPARTMENT_OTHER): Payer: Medicare HMO | Admitting: Hematology

## 2023-05-19 VITALS — Wt 237.4 lb

## 2023-05-19 DIAGNOSIS — R197 Diarrhea, unspecified: Secondary | ICD-10-CM

## 2023-05-19 DIAGNOSIS — R110 Nausea: Secondary | ICD-10-CM

## 2023-05-19 DIAGNOSIS — E860 Dehydration: Secondary | ICD-10-CM

## 2023-05-19 DIAGNOSIS — C20 Malignant neoplasm of rectum: Secondary | ICD-10-CM | POA: Diagnosis not present

## 2023-05-19 DIAGNOSIS — Z95828 Presence of other vascular implants and grafts: Secondary | ICD-10-CM

## 2023-05-19 DIAGNOSIS — Z510 Encounter for antineoplastic radiation therapy: Secondary | ICD-10-CM | POA: Diagnosis not present

## 2023-05-19 DIAGNOSIS — D509 Iron deficiency anemia, unspecified: Secondary | ICD-10-CM

## 2023-05-19 LAB — CBC WITH DIFFERENTIAL/PLATELET
Abs Immature Granulocytes: 0.01 10*3/uL (ref 0.00–0.07)
Basophils Absolute: 0 10*3/uL (ref 0.0–0.1)
Basophils Relative: 1 %
Eosinophils Absolute: 0.2 10*3/uL (ref 0.0–0.5)
Eosinophils Relative: 4 %
HCT: 36.7 % (ref 36.0–46.0)
Hemoglobin: 11.5 g/dL — ABNORMAL LOW (ref 12.0–15.0)
Immature Granulocytes: 0 %
Lymphocytes Relative: 31 %
Lymphs Abs: 1.3 10*3/uL (ref 0.7–4.0)
MCH: 31.7 pg (ref 26.0–34.0)
MCHC: 31.3 g/dL (ref 30.0–36.0)
MCV: 101.1 fL — ABNORMAL HIGH (ref 80.0–100.0)
Monocytes Absolute: 0.3 10*3/uL (ref 0.1–1.0)
Monocytes Relative: 6 %
Neutro Abs: 2.4 10*3/uL (ref 1.7–7.7)
Neutrophils Relative %: 58 %
Platelets: 112 10*3/uL — ABNORMAL LOW (ref 150–400)
RBC: 3.63 MIL/uL — ABNORMAL LOW (ref 3.87–5.11)
RDW: 17.6 % — ABNORMAL HIGH (ref 11.5–15.5)
WBC: 4.1 10*3/uL (ref 4.0–10.5)
nRBC: 0 % (ref 0.0–0.2)

## 2023-05-19 LAB — RAD ONC ARIA SESSION SUMMARY
Course Elapsed Days: 9
Plan Fractions Treated to Date: 8
Plan Prescribed Dose Per Fraction: 1.8 Gy
Plan Total Fractions Prescribed: 25
Plan Total Prescribed Dose: 45 Gy
Reference Point Dosage Given to Date: 14.4 Gy
Reference Point Session Dosage Given: 1.8 Gy
Session Number: 8

## 2023-05-19 LAB — SAMPLE TO BLOOD BANK

## 2023-05-19 LAB — COMPREHENSIVE METABOLIC PANEL
ALT: 19 U/L (ref 0–44)
AST: 43 U/L — ABNORMAL HIGH (ref 15–41)
Albumin: 2.4 g/dL — ABNORMAL LOW (ref 3.5–5.0)
Alkaline Phosphatase: 121 U/L (ref 38–126)
Anion gap: 11 (ref 5–15)
BUN: 8 mg/dL (ref 8–23)
CO2: 22 mmol/L (ref 22–32)
Calcium: 7.9 mg/dL — ABNORMAL LOW (ref 8.9–10.3)
Chloride: 103 mmol/L (ref 98–111)
Creatinine, Ser: 0.61 mg/dL (ref 0.44–1.00)
GFR, Estimated: >60 mL/min (ref >60)
Glucose, Bld: 122 mg/dL — ABNORMAL HIGH (ref 70–99)
Potassium: 2.8 mmol/L — ABNORMAL LOW (ref 3.5–5.1)
Sodium: 136 mmol/L (ref 135–145)
Total Bilirubin: 0.9 mg/dL (ref 0.3–1.2)
Total Protein: 5.6 g/dL — ABNORMAL LOW (ref 6.5–8.1)

## 2023-05-19 LAB — MAGNESIUM: Magnesium: 1.3 mg/dL — ABNORMAL LOW (ref 1.7–2.4)

## 2023-05-19 MED ORDER — SODIUM CHLORIDE 0.9% FLUSH
10.0000 mL | Freq: Once | INTRAVENOUS | Status: AC
  Administered 2023-05-19: 10 mL via INTRAVENOUS

## 2023-05-19 MED ORDER — HEPARIN SOD (PORK) LOCK FLUSH 100 UNIT/ML IV SOLN
500.0000 [IU] | Freq: Once | INTRAVENOUS | Status: AC | PRN
  Administered 2023-05-19: 500 [IU]

## 2023-05-19 MED ORDER — MAGNESIUM SULFATE 2 GM/50ML IV SOLN
2.0000 g | Freq: Once | INTRAVENOUS | Status: AC
  Administered 2023-05-19: 2 g via INTRAVENOUS
  Filled 2023-05-19: qty 50

## 2023-05-19 MED ORDER — POTASSIUM CHLORIDE IN NACL 20-0.9 MEQ/L-% IV SOLN
Freq: Once | INTRAVENOUS | Status: AC
  Filled 2023-05-19: qty 1000

## 2023-05-19 MED ORDER — MAGNESIUM OXIDE -MG SUPPLEMENT 400 (240 MG) MG PO TABS: 800.0000 mg | ORAL_TABLET | Freq: Every day | ORAL | 3 refills | Status: DC

## 2023-05-19 MED ORDER — ONDANSETRON HCL 4 MG/2ML IJ SOLN
8.0000 mg | Freq: Once | INTRAMUSCULAR | Status: AC
  Administered 2023-05-19: 8 mg via INTRAVENOUS
  Filled 2023-05-19: qty 4

## 2023-05-19 MED ORDER — LOPERAMIDE HCL 2 MG PO CAPS
4.0000 mg | ORAL_CAPSULE | Freq: Once | ORAL | Status: AC
  Administered 2023-05-19: 4 mg via ORAL
  Filled 2023-05-19: qty 2

## 2023-05-19 MED ORDER — SODIUM CHLORIDE 0.9% FLUSH
10.0000 mL | Freq: Once | INTRAVENOUS | Status: AC | PRN
  Administered 2023-05-19: 10 mL

## 2023-05-19 NOTE — Patient Instructions (Signed)
[facility]  Discharge Instructions: Thank you for choosing [facility] to provide your oncology and hematology care.  If you have a lab appointment with the Cancer Center - please note that after April 8th, 2024, all labs will be drawn in the cancer center.  You do not have to check in or register with the main entrance as you have in the past but will complete your check-in in the cancer center.  Wear comfortable clothing and clothing appropriate for easy access to any Portacath or PICC line.   We strive to give you quality time with your provider. You may need to reschedule your appointment if you arrive late (15 or more minutes).  Arriving late affects you and other patients whose appointments are after yours.  Also, if you miss three or more appointments without notifying the office, you may be dismissed from the clinic at the provider's discretion.      For prescription refill requests, have your pharmacy contact our office and allow 72 hours for refills to be completed.  To help prevent nausea and vomiting after your treatment, we encourage you to take your nausea medication as directed.  BELOW ARE SYMPTOMS THAT SHOULD BE REPORTED IMMEDIATELY: *FEVER GREATER THAN 100.4 F (38 C) OR HIGHER *CHILLS OR SWEATING *NAUSEA AND VOMITING THAT IS NOT CONTROLLED WITH YOUR NAUSEA MEDICATION *UNUSUAL SHORTNESS OF BREATH *UNUSUAL BRUISING OR BLEEDING *URINARY PROBLEMS (pain or burning when urinating, or frequent urination) *BOWEL PROBLEMS (unusual diarrhea, constipation, pain near the anus) TENDERNESS IN MOUTH AND THROAT WITH OR WITHOUT PRESENCE OF ULCERS (sore throat, sores in mouth, or a toothache) UNUSUAL RASH, SWELLING OR PAIN  UNUSUAL VAGINAL DISCHARGE OR ITCHING   Items with * indicate a potential emergency and should be followed up as soon as possible or go to the Emergency Department if any problems should occur.  Please show the CHEMOTHERAPY ALERT CARD or  IMMUNOTHERAPY ALERT CARD at check-in to the Emergency Department and triage nurse.  Should you have questions after your visit or need to cancel or reschedule your appointment, please contact [facility] [phone]  and follow the prompts.  Office hours are 8:00 a.m. to 4:30 p.m. Monday - Friday. Please note that voicemails left after 4:00 p.m. may not be returned until the following business day.  We are closed weekends and major holidays. You have access to a nurse at all times for urgent questions. Please call the main number to the clinic [phone] and follow the prompts.  For any non-urgent questions, you may also contact your provider using MyChart. We now offer e-Visits for anyone 18 and older to request care online for non-urgent symptoms. For details visit [email or web address].   Also download the MyChart app! Go to the app store, search "MyChart", open the app, select [facility], and log in with your MyChart username and password.   

## 2023-05-19 NOTE — Progress Notes (Signed)
Patient presents today for IVF.  Patient is in satisfactory condition with only complaints of nausea and diarrhea.  Vital signs are stable.  Labs reviewed by Dr. Ellin Saba during her office visit.  Magnesium today is 1.3 and potassium is 2.8.  We will give NS with 20 mEq of potassium and 4 grams of IV magnesium sulfate today per MD orders.  We will proceed with IVF per orders.   Patient asked for something for nausea and diarrhea.  Rojelio Brenner, PA-C made aware.  We will give Zofran 8 mg IV x one dose and Imodium 4 mg PO x one dose today per Rojelio Brenner, PA-C.    Patient tolerated IVF well with no complaints voiced.  Patient left via wheelchair with grandson in stable condition.  Vital signs stable at discharge.  Follow up as scheduled.

## 2023-05-19 NOTE — Patient Instructions (Addendum)
[facility] Discharge Instructions   You were seen and examined today by Dr. Ellin Saba.  He reviewed the results of your lab work. Your magnesium and potassium are low today. We will give you fluids with potassium and magnesium today.   You should increase your magnesium at home to two pills twice a day. Increase the potassium to four times a day.   Dr. Kirtland Bouchard would like for you to start the Xeloda. He would like for you to take 2 pills twice a day. Only take on the days of radiation.   Return as scheduled.      Thank you for choosing [facility] at [facility] to provide your oncology and hematology care.  To afford each patient quality time with our provider, please arrive at least 15 minutes before your scheduled appointment time.   If you have a lab appointment with the Cancer Center please come in thru the Main Entrance and check in at the main information desk.  You need to re-schedule your appointment should you arrive 10 or more minutes late.  We strive to give you quality time with our providers, and arriving late affects you and other patients whose appointments are after yours.  Also, if you no show three or more times for appointments you may be dismissed from the clinic at the providers discretion.     Again, thank you for choosing [facility].  Our hope is that these requests will decrease the amount of time that you wait before being seen by our physicians.       _____________________________________________________________  Should you have questions after your visit to [facility], please contact our office at [phone] and follow the prompts.  Our office hours are 8:00 a.m. and 4:30 p.m. Monday - Friday.  Please note that voicemails left after 4:00 p.m. may not be returned until the following business day.  We are closed weekends and major holidays.  You do have access to a nurse 24-7, just  call the main number to the clinic [phone] and do not press any options, hold on the line and a nurse will answer the phone.    For prescription refill requests, have your pharmacy contact our office and allow 72 hours.    Due to Covid, you will need to wear a mask upon entering the hospital. If you do not have a mask, a mask will be given to you at the Main Entrance upon arrival. For doctor visits, patients may have 1 support person age 5 or older with them. For treatment visits, patients can not have anyone with them due to social distancing guidelines and our immunocompromised population.

## 2023-05-20 ENCOUNTER — Other Ambulatory Visit: Payer: Self-pay

## 2023-05-20 ENCOUNTER — Ambulatory Visit
Admission: RE | Admit: 2023-05-20 | Discharge: 2023-05-20 | Disposition: A | Discharge status: Home / Self Care | Payer: Medicare HMO | Source: Ambulatory Visit | Attending: Radiation Oncology | Admitting: Radiation Oncology

## 2023-05-20 ENCOUNTER — Other Ambulatory Visit (HOSPITAL_COMMUNITY): Payer: Self-pay

## 2023-05-20 DIAGNOSIS — Z510 Encounter for antineoplastic radiation therapy: Secondary | ICD-10-CM | POA: Diagnosis not present

## 2023-05-20 LAB — RAD ONC ARIA SESSION SUMMARY
Course Elapsed Days: 10
Plan Fractions Treated to Date: 9
Plan Prescribed Dose Per Fraction: 1.8 Gy
Plan Total Fractions Prescribed: 25
Plan Total Prescribed Dose: 45 Gy
Reference Point Dosage Given to Date: 16.2 Gy
Reference Point Session Dosage Given: 1.8 Gy
Session Number: 9

## 2023-05-21 ENCOUNTER — Other Ambulatory Visit: Payer: Self-pay

## 2023-05-21 ENCOUNTER — Ambulatory Visit
Admission: RE | Admit: 2023-05-21 | Discharge: 2023-05-21 | Disposition: A | Discharge status: Home / Self Care | Payer: Medicare HMO | Source: Ambulatory Visit | Attending: Radiation Oncology | Admitting: Radiation Oncology

## 2023-05-21 DIAGNOSIS — Z510 Encounter for antineoplastic radiation therapy: Secondary | ICD-10-CM | POA: Diagnosis not present

## 2023-05-21 LAB — RAD ONC ARIA SESSION SUMMARY
Course Elapsed Days: 11
Plan Fractions Treated to Date: 10
Plan Prescribed Dose Per Fraction: 1.8 Gy
Plan Total Fractions Prescribed: 25
Plan Total Prescribed Dose: 45 Gy
Reference Point Dosage Given to Date: 18 Gy
Reference Point Session Dosage Given: 1.8 Gy
Session Number: 10

## 2023-05-24 ENCOUNTER — Ambulatory Visit: Payer: Medicare HMO

## 2023-05-24 ENCOUNTER — Other Ambulatory Visit: Payer: Self-pay

## 2023-05-24 ENCOUNTER — Ambulatory Visit
Admission: RE | Admit: 2023-05-24 | Discharge: 2023-05-24 | Disposition: A | Discharge status: Home / Self Care | Payer: Medicare HMO | Source: Ambulatory Visit | Attending: Radiation Oncology | Admitting: Radiation Oncology

## 2023-05-24 ENCOUNTER — Other Ambulatory Visit: Payer: Self-pay | Admitting: Nurse Practitioner

## 2023-05-24 DIAGNOSIS — E538 Deficiency of other specified B group vitamins: Secondary | ICD-10-CM | POA: Insufficient documentation

## 2023-05-24 DIAGNOSIS — R197 Diarrhea, unspecified: Secondary | ICD-10-CM | POA: Insufficient documentation

## 2023-05-24 DIAGNOSIS — C20 Malignant neoplasm of rectum: Secondary | ICD-10-CM | POA: Insufficient documentation

## 2023-05-24 DIAGNOSIS — R102 Pelvic and perineal pain: Secondary | ICD-10-CM | POA: Insufficient documentation

## 2023-05-24 DIAGNOSIS — T451X5A Adverse effect of antineoplastic and immunosuppressive drugs, initial encounter: Secondary | ICD-10-CM | POA: Insufficient documentation

## 2023-05-24 DIAGNOSIS — E860 Dehydration: Secondary | ICD-10-CM | POA: Insufficient documentation

## 2023-05-24 DIAGNOSIS — K521 Toxic gastroenteritis and colitis: Secondary | ICD-10-CM | POA: Insufficient documentation

## 2023-05-24 DIAGNOSIS — R64 Cachexia: Secondary | ICD-10-CM | POA: Insufficient documentation

## 2023-05-24 DIAGNOSIS — E1142 Type 2 diabetes mellitus with diabetic polyneuropathy: Secondary | ICD-10-CM | POA: Insufficient documentation

## 2023-05-24 DIAGNOSIS — R112 Nausea with vomiting, unspecified: Secondary | ICD-10-CM | POA: Insufficient documentation

## 2023-05-24 DIAGNOSIS — Z510 Encounter for antineoplastic radiation therapy: Secondary | ICD-10-CM | POA: Diagnosis present

## 2023-05-24 DIAGNOSIS — E876 Hypokalemia: Secondary | ICD-10-CM | POA: Diagnosis not present

## 2023-05-24 DIAGNOSIS — G4700 Insomnia, unspecified: Secondary | ICD-10-CM | POA: Diagnosis not present

## 2023-05-24 DIAGNOSIS — Z79899 Other long term (current) drug therapy: Secondary | ICD-10-CM | POA: Diagnosis not present

## 2023-05-24 DIAGNOSIS — E46 Unspecified protein-calorie malnutrition: Secondary | ICD-10-CM | POA: Insufficient documentation

## 2023-05-24 LAB — RAD ONC ARIA SESSION SUMMARY
Course Elapsed Days: 14
Plan Fractions Treated to Date: 11
Plan Prescribed Dose Per Fraction: 1.8 Gy
Plan Total Fractions Prescribed: 25
Plan Total Prescribed Dose: 45 Gy
Reference Point Dosage Given to Date: 19.8 Gy
Reference Point Session Dosage Given: 1.8 Gy
Session Number: 11

## 2023-05-25 ENCOUNTER — Ambulatory Visit
Admission: RE | Admit: 2023-05-25 | Discharge: 2023-05-25 | Disposition: A | Discharge status: Home / Self Care | Payer: Medicare HMO | Source: Ambulatory Visit | Attending: Radiation Oncology | Admitting: Radiation Oncology

## 2023-05-25 ENCOUNTER — Other Ambulatory Visit (HOSPITAL_COMMUNITY): Payer: Self-pay

## 2023-05-25 ENCOUNTER — Other Ambulatory Visit: Payer: Self-pay

## 2023-05-25 DIAGNOSIS — Z510 Encounter for antineoplastic radiation therapy: Secondary | ICD-10-CM | POA: Diagnosis not present

## 2023-05-25 LAB — RAD ONC ARIA SESSION SUMMARY
Course Elapsed Days: 15
Plan Fractions Treated to Date: 12
Plan Prescribed Dose Per Fraction: 1.8 Gy
Plan Total Fractions Prescribed: 25
Plan Total Prescribed Dose: 45 Gy
Reference Point Dosage Given to Date: 21.6 Gy
Reference Point Session Dosage Given: 1.8 Gy
Session Number: 12

## 2023-05-25 NOTE — Progress Notes (Signed)
[facility] [address], [place] [address]    Clinic Day:  05/26/2023  Referring physician: Gwenlyn Found, MD  Patient Care Team: Gwenlyn Found, MD as PCP - General (Family Medicine) Jonelle Sidle, MD as PCP - Cardiology (Cardiology) Jena Gauss Gerrit Friends, MD as Consulting Physician (Gastroenterology) [name], MD as Medical Oncologist (Medical Oncology) Therese Sarah, RN as Oncology Nurse Navigator (Medical Oncology)   ASSESSMENT & PLAN:   Assessment: 1.  Stage IIc (T3d/4b N0 M0) rectal cancer: - Rectal bleeding for the past 4 to 5 years. - Colonoscopy (12/16/2022): Tumor protruding through the anal orifice.  Bulky semilunar lateral rectal tumor extending from the anorectal junction proximally about 13 cm. 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  MSI-high not detected by [id number] - CT CAP (12/17/2022): Locally advanced low rectal primary without bowel obstruction or nodal metastasis.  Isolated right middle lobe subpleural lung nodule most likely benign.  Cirrhosis and portal venous hypertension.  2.6 cm right renal lesion with differential hemorrhagic/proteinaceous cyst or solid neoplasm. - PET scan (12/31/2022): Markedly hypermetabolic rectal lesion with no evidence for perirectal or pelvic lymphadenopathy.  No metastatic disease in the neck, chest, abdomen or pelvis.  Focal hypermetabolism in the region of the gallbladder neck corresponds to subtle 13 mm focus of increased attenuation in the lumen of the gallbladder.  Differential includes gallbladder polyp/neoplasm.  2.7 cm exophytic posterior right interpolar renal lesion shows no substantial hypermetabolism, likely cyst.  2.3 cm inferior right thyroid nodule without hypermetabolism. - MRI pelvis (12/24/2022): Extension through muscularis propria.  Tumor size 3.8 x 3 x 5.8 cm with a volume 35 cm.  T stage is T3d, probable T4.  Suspicion  of involvement of pelvic floor musculature and the far posterior and left side of the vagina. - 6 cycles of FOLFIRINOX from 01/20/2023 through 03/31/2023 - MRI pelvis (04/12/2023): Tumor size has improved.  T3c N0. - XRT started on 05/10/2023, Xeloda started on 05/19/2023   2.  Social/family history: - She lives at home with her family.  She is accompanied by her daughter today.  She is independent of ADLs and IADLs.  She is a retired Production designer, theatre/television/film at [employer].  Non-smoker.  She reports having hysterectomy in 1986 for cancerous polyps in her endometrium. - Maternal grandmother's sister had colon cancer.    Plan: 1.  Stage IIc (T3d/4BN0M0) rectal cancer, MSI stable: - She started Xeloda 1 g twice daily on 05/19/2023. - She reports diarrhea and decreased eating.  She is drinking 1 Ensure per day. - Reviewed labs today: Severely low electrolytes including potassium and magnesium.  Albumin is low at 2.3.  CBC grossly normal with mild thrombocytopenia. - Recommend holding Xeloda at this time.  If her diarrhea comes back to baseline, she may restart Xeloda 2 tablets twice daily on Monday.  RTC 1 week for follow-up.   2.  Peripheral neuropathy from diabetes: - She has difficulty feeling her toes.  No worsening since chemo started.   3.  Severe microcytic anemia: - Hemoglobin is stable at 11.5.  She has slight bleeding per rectum.   4.  Hypokalemia: - She is taking in liquid potassium 40 mEq 3 times daily.  Potassium today is 2.6.  We will replete in the clinic.  Will increase potassium to 4 times daily.   5.  Hypomagnesemia: - Continue magnesium 800 mg twice daily.  Magnesium will be repleted intravenously today.  6.  Diarrhea: - She started diarrhea Sunday with 1 watery stool, Monday 3 times and Tuesday 4-5 times.  She is taking Lomotil 2 tablets 4 times daily and Imodium as needed.    No orders of the defined types were placed in this encounter.     I,Katie Daubenspeck,acting as a  Neurosurgeon for [name], MD.,have documented all relevant documentation on the behalf of [name], MD,as directed by  [name], MD while in the presence of [name], MD.   I, [name] MD, have reviewed the above documentation for accuracy and completeness, and I agree with the above.   [name], MD   7/3/20247:12 PM  CHIEF COMPLAINT:   Diagnosis: stage IIc (T4b N0) low rectal cancer    Cancer Staging  Rectal cancer [facility]) Staging form: Colon and Rectum, AJCC 8th Edition - Clinical stage from 01/03/2023: Stage IIC (cT4b, cN0, cM0) - Unsigned    Prior Therapy: FOLFIRINOX   Current Therapy:  chemoradiation with Xeloda    HISTORY OF PRESENT ILLNESS:   Oncology History  Rectal cancer (HCC)  01/03/2023 Initial Diagnosis   Rectal cancer (HCC)   01/20/2023 - 04/02/2023 Chemotherapy   Patient is on Treatment Plan : RECTAL Modified FOLFIRINOX q14d x 8 cycles        INTERVAL HISTORY:   [name] is a 67 y.o. female presenting to clinic today for follow up of stage IIc (T4b N0) low rectal cancer. She was last seen by me on 05/19/23.  Today, she states that she is doing well overall. Her appetite level is at 50%. Her energy level is at 10%.  PAST MEDICAL HISTORY:   Past Medical History: Past Medical History:  Diagnosis Date   Anxiety    Cancer (HCC)    Coronary atherosclerosis    Cardiac CT 09/2018 with calcium score 8.7 and mild proximal LAD disease   Essential hypertension 2009   GERD (gastroesophageal reflux disease)    Hypothyroidism    Iron deficiency anemia    Osteoarthritis    Palpitations    Tachypalpitations on beta blockers since 2010   Type 2 diabetes mellitus (HCC) 2003   Vitamin B 12 deficiency    Vitamin D deficiency     Surgical History: Past Surgical History:  Procedure Laterality Date   ABDOMINAL HYSTERECTOMY     History of cervical cancer   BIOPSY  12/16/2022   Procedure: BIOPSY;  Surgeon:  Corbin Ade, MD;  [facility];  Service: Endoscopy;;  gastric, rectal   BREAST BIOPSY Right    COLONOSCOPY WITH PROPOFOL N/A 12/16/2022   Procedure: COLONOSCOPY WITH PROPOFOL;  Surgeon: Corbin Ade, MD;  [facility];  Service: Endoscopy;  Laterality: N/A;  10:45 am   ESOPHAGOGASTRODUODENOSCOPY (EGD) WITH PROPOFOL N/A 12/16/2022   Procedure: ESOPHAGOGASTRODUODENOSCOPY (EGD) WITH PROPOFOL;  Surgeon: Corbin Ade, MD;  [facility];  Service: Endoscopy;  Laterality: N/A;   IR IMAGING GUIDED PORT INSERTION  01/15/2023   MALONEY DILATION N/A 12/16/2022   Procedure: Elease Hashimoto DILATION;  Surgeon: Corbin Ade, MD;  [facility];  Service: Endoscopy;  Laterality: N/A;   POLYPECTOMY  12/16/2022   Procedure: POLYPECTOMY;  Surgeon: Corbin Ade, MD;  [facility];  Service: Endoscopy;;   TONSILLECTOMY      Social History: Social History   Socioeconomic History   Marital status: Single    Spouse name: Not on file   Number of children: 2  Years of education: Not on file   Highest education level: Not on file  Occupational History   Occupation: DISABLED    Employer: UNEMPLOYED    Comment: [employer]  Tobacco Use   Smoking status: Never   Smokeless tobacco: Never  Vaping Use   Vaping Use: Never used  Substance and Sexual Activity   Alcohol use: No   Drug use: No   Sexual activity: Never  Other Topics Concern   Not on file  Social History Narrative   Has 2 grandchildren. 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   Social Determinants of Health   Financial Resource Strain: Not on file  Food Insecurity: No Food Insecurity (01/06/2023)   Hunger Vital Sign    Worried About Running Out of Food in the Last Year: Never true    Ran Out of Food in the Last Year: Never true  Transportation Needs: No Transportation Needs (01/06/2023)   PRAPARE - Administrator, Civil Service (Medical): No    Lack  of Transportation (Non-Medical): No  Physical Activity: Not on file  Stress: Not on file  Social Connections: Not on file  Intimate Partner Violence: Not At Risk (01/06/2023)   Humiliation, Afraid, Rape, and Kick questionnaire    Fear of Current or Ex-Partner: No    Emotionally Abused: No    Physically Abused: No    Sexually Abused: No    Family History: Family History  Problem Relation Age of Onset   Heart failure Mother    Diabetes Father    Hypertension Father    Stroke Sister 50   Diabetes Brother    Cancer - Colon Other        age 48   Colon polyps Neg Hx     Current Medications:  Current Outpatient Medications:    blood glucose meter kit and supplies KIT, Dispense based on patient and insurance preference. Use up to four times daily as directed. (FOR ICD-10 E11.65) Number of Strips 400 Number of Lancets 400, Disp: 1 each, Rfl: 0   Blood Glucose Monitoring Suppl (TRUE METRIX METER) w/Device KIT, 1 each by Does not apply route 2 (two) times daily. Use to test BG bid E11.65, Disp: 1 kit, Rfl: 0   diphenoxylate-atropine (LOMOTIL) 2.5-0.025 MG tablet, Take 1 tablet by mouth 4 (four) times daily. (Patient taking differently: Take 2 tablets by mouth 4 (four) times daily.), Disp: 360 tablet, Rfl: 3   Feeding Tubes (FEEDING TUBE ATTACHMENT DEVICE) MISC, 3 in 1 Bedside commode, Disp: , Rfl:    FLUOROURACIL IV, Inject into the vein every 14 (fourteen) days., Disp: , Rfl:    FLUoxetine (PROZAC) 20 MG capsule, Take 20 mg by mouth daily., Disp: , Rfl:    HYDROcodone-acetaminophen (NORCO/VICODIN) 5-325 MG tablet, TAKE (1) TABLET BY MOUTH EVERY (6) HOURS AS NEEDED., Disp: 30 tablet, Rfl: 0   insulin aspart (NOVOLOG FLEXPEN) 100 UNIT/ML FlexPen, INJECT 0 TO 6 UNITS UNDER SKIN THREE TIMES DAILY WITH MEALS (DISCARD PEN 28 DAYS AFTER OPENING), Disp: 15 mL, Rfl: 0   insulin glargine (LANTUS SOLOSTAR) 100 UNIT/ML Solostar Pen, INJECT 40 UNITS UNDER THE SKIN AT BEDTIME, Disp: 45 mL, Rfl: 0    IRINOTECAN HCL IV, Inject into the vein every 14 (fourteen) days., Disp: , Rfl:    LEUCOVORIN CALCIUM IV, Inject into the vein every 14 (fourteen) days., Disp: , Rfl:    levothyroxine (SYNTHROID) 88 MCG tablet, TAKE 1 TABLET EVERY DAY BEFORE BREAKFAST (Patient taking  differently: Take 88 mcg by mouth daily before breakfast.), Disp: 90 tablet, Rfl: 3   lidocaine (XYLOCAINE) 2 % solution, , Disp: , Rfl:    loperamide (IMODIUM) 2 MG capsule, Take 1 capsule (2 mg total) by mouth as needed for diarrhea or loose stools. TAKE 1 CAPSULE AS DIRECTED AS NEEDED FOR DIARRHEA OR LOOSE STOOLS, Disp: 30 capsule, Rfl: 11   magnesium oxide (MAG-OX) 400 (240 Mg) MG tablet, Take 2 tablets (800 mg total) by mouth daily., Disp: 120 tablet, Rfl: 3   Misc. Devices (ROLLATOR ULTRA-LIGHT) MISC, Rollator walker, Disp: , Rfl:    OLANZapine (ZYPREXA) 5 MG tablet, Take 2 tablets (10 mg total) by mouth at bedtime., Disp: 60 tablet, Rfl: 2   ondansetron (ZOFRAN-ODT) 4 MG disintegrating tablet, Take 1 tablet (4 mg total) by mouth every 8 (eight) hours as needed for nausea or vomiting. DISSOLVE 2 TAB ON TONGUE EVERY 8HR AS NEEDED NAUSEA, VOMITING (BETWEEN COMPAZINE DOSES FOR MAX RELIEF) Strength: 4 mg, Disp: 60 tablet, Rfl: 1   OXALIPLATIN IV, Inject into the vein every 14 (fourteen) days., Disp: , Rfl:    pantoprazole (PROTONIX) 40 MG tablet, Take 1 tablet (40 mg total) by mouth 2 (two) times daily before a meal., Disp: 60 tablet, Rfl: 3   Potassium Chloride 40 MEQ/15ML (20%) SOLN, Take 15 mLs by mouth 3 (three) times daily., Disp: 473 mL, Rfl: 0   prochlorperazine (COMPAZINE) 10 MG tablet, Take 1 tablet (10 mg total) by mouth every 8 (eight) hours as needed for nausea or vomiting. TAKE 1 TABLET EVERY 6 HOURS AS NEEDED FOR NAUSEA OR VOMITING Strength: 10 mg, Disp: 30 tablet, Rfl: 3   TRUE METRIX BLOOD GLUCOSE TEST test strip, TEST BLOOD SUGAR TWICE DAILY, Disp: 200 strip, Rfl: 3   Insulin Pen Needle (DROPLET PEN NEEDLES) 31G X 5 MM  MISC, USE AS INSTRUCTED TO INJECT INSULIN FOUR TIMES DAILY, Disp: 100 each, Rfl: 3 No current facility-administered medications for this visit.  Facility-Administered Medications Ordered in Other Visits:    0.9 %  sodium chloride infusion, , Intravenous, Continuous, [name], MD, Stopped at 02/17/23 1535   Allergies: Allergies  Allergen Reactions   Contrast Media [Iodinated Contrast Media] Other (See Comments)    Burning   Sulfa Antibiotics Nausea And Vomiting    Other reaction(s): GI Upset (intolerance) unknown   Doxepin Rash and Swelling   Tape Rash    REVIEW OF SYSTEMS:   Review of Systems  Constitutional:  Negative for chills, fatigue and fever.  HENT:   Negative for lump/mass, mouth sores, nosebleeds, sore throat and trouble swallowing.   Eyes:  Negative for eye problems.  Respiratory:  Negative for cough and shortness of breath.   Cardiovascular:  Negative for chest pain, leg swelling and palpitations.  Gastrointestinal:  Positive for abdominal pain and diarrhea. Negative for constipation, nausea and vomiting.  Genitourinary:  Negative for bladder incontinence, difficulty urinating, dysuria, frequency, hematuria and nocturia.   Musculoskeletal:  Negative for arthralgias, back pain, flank pain, myalgias and neck pain.  Skin:  Negative for itching and rash.  Neurological:  Negative for dizziness, headaches and numbness.  Hematological:  Does not bruise/bleed easily.  Psychiatric/Behavioral:  Negative for depression, sleep disturbance and suicidal ideas. The patient is not nervous/anxious.   All other systems reviewed and are negative.    VITALS:   There were no vitals taken for this visit.  Wt Readings from Last 3 Encounters:  05/19/23 237 lb 6.4 oz (107.7 kg)  05/13/23 229 lb (103.9 kg)  05/07/23 223 lb (101.2 kg)    There is no height or weight on file to calculate BMI.  Performance status (ECOG): 2 - Symptomatic, <50% confined to bed  PHYSICAL EXAM:    Physical Exam Vitals and nursing note reviewed. Exam conducted with a chaperone present.  Constitutional:      Appearance: Normal appearance.  Cardiovascular:     Rate and Rhythm: Normal rate and regular rhythm.     Pulses: Normal pulses.     Heart sounds: Normal heart sounds.  Pulmonary:     Effort: Pulmonary effort is normal.     Breath sounds: Normal breath sounds.  Abdominal:     Palpations: Abdomen is soft. There is no hepatomegaly, splenomegaly or mass.     Tenderness: There is no abdominal tenderness.  Musculoskeletal:     Right lower leg: No edema.     Left lower leg: No edema.  Lymphadenopathy:     Cervical: No cervical adenopathy.     Right cervical: No superficial, deep or posterior cervical adenopathy.    Left cervical: No superficial, deep or posterior cervical adenopathy.     Upper Body:     Right upper body: No supraclavicular or axillary adenopathy.     Left upper body: No supraclavicular or axillary adenopathy.  Neurological:     General: No focal deficit present.     Mental Status: She is alert and oriented to person, place, and time.  Psychiatric:        Mood and Affect: Mood normal.        Behavior: Behavior normal.     LABS:      Latest Ref Rng & Units 05/26/2023    8:41 AM 05/19/2023    8:44 AM 05/13/2023    8:22 AM  CBC  WBC 4.0 - 10.5 K/uL 4.8  4.1  3.9   Hemoglobin 12.0 - 15.0 g/dL 16.1  09.6  04.5   Hematocrit 36.0 - 46.0 % 36.8  36.7  37.2   Platelets 150 - 400 K/uL 118  112  117       Latest Ref Rng & Units 05/26/2023    8:41 AM 05/19/2023    8:44 AM 05/14/2023   12:31 PM  CMP  Glucose 70 - 99 mg/dL 409  811  914   BUN 8 - 23 mg/dL 7  8  9    Creatinine 0.44 - 1.00 mg/dL 7.82  9.56  2.13   Sodium 135 - 145 mmol/L 135  136  138   Potassium 3.5 - 5.1 mmol/L 2.6  2.8  3.3   Chloride 98 - 111 mmol/L 103  103  105   CO2 22 - 32 mmol/L 22  22  23    Calcium 8.9 - 10.3 mg/dL 7.7  7.9  8.3   Total Protein 6.5 - 8.1 g/dL 5.5  5.6    Total  Bilirubin 0.3 - 1.2 mg/dL 0.9  0.9    Alkaline Phos 38 - 126 U/L 131  121    AST 15 - 41 U/L 32  43    ALT 0 - 44 U/L 14  19       Lab Results  Component Value Date   CEA1 3.8 12/16/2022   /  CEA  Date Value Ref Range Status  12/16/2022 3.8 0.0 - 4.7 ng/mL Final    Comment:    (NOTE)  Nonsmokers          <3.9                             Smokers             <5.6 Roche [employer]ostics Electrochemiluminescence Immunoassay (ECLIA) Values obtained with different assay methods or kits cannot be used interchangeably.  Results cannot be interpreted as absolute evidence of the presence or absence of malignant disease. Performed At: [facility] [address] [place], [place] [id number] Jolene Schimke MD NW:[id number]    No results found for: "PSA1" No results found for: "CAN199" No results found for: "CAN125"  No results found for: "TOTALPROTELP", "ALBUMINELP", "A1GS", "A2GS", "BETS", "BETA2SER", "GAMS", "MSPIKE", "SPEI" Lab Results  Component Value Date   TIBC 157 (L) 04/09/2023   TIBC 370 01/04/2023   FERRITIN 2,274 (H) 04/09/2023   FERRITIN 4 (L) 01/04/2023   IRONPCTSAT 57 (H) 04/09/2023   IRONPCTSAT 4 (L) 01/04/2023   Lab Results  Component Value Date   LDH 136 01/04/2023     STUDIES:   No results found.

## 2023-05-26 ENCOUNTER — Other Ambulatory Visit: Payer: Self-pay

## 2023-05-26 ENCOUNTER — Inpatient Hospital Stay: Payer: Medicare HMO | Attending: Hematology

## 2023-05-26 ENCOUNTER — Other Ambulatory Visit: Payer: Self-pay | Admitting: Nurse Practitioner

## 2023-05-26 ENCOUNTER — Inpatient Hospital Stay: Payer: Medicare HMO

## 2023-05-26 ENCOUNTER — Ambulatory Visit
Admission: RE | Admit: 2023-05-26 | Discharge: 2023-05-26 | Disposition: A | Discharge status: Home / Self Care | Payer: Medicare HMO | Source: Ambulatory Visit | Attending: Radiation Oncology | Admitting: Radiation Oncology

## 2023-05-26 ENCOUNTER — Inpatient Hospital Stay (HOSPITAL_BASED_OUTPATIENT_CLINIC_OR_DEPARTMENT_OTHER): Payer: Medicare HMO | Admitting: Hematology

## 2023-05-26 DIAGNOSIS — E876 Hypokalemia: Secondary | ICD-10-CM | POA: Insufficient documentation

## 2023-05-26 DIAGNOSIS — Z510 Encounter for antineoplastic radiation therapy: Secondary | ICD-10-CM | POA: Diagnosis not present

## 2023-05-26 DIAGNOSIS — C20 Malignant neoplasm of rectum: Secondary | ICD-10-CM

## 2023-05-26 DIAGNOSIS — Z794 Long term (current) use of insulin: Secondary | ICD-10-CM

## 2023-05-26 DIAGNOSIS — D509 Iron deficiency anemia, unspecified: Secondary | ICD-10-CM

## 2023-05-26 DIAGNOSIS — R197 Diarrhea, unspecified: Secondary | ICD-10-CM | POA: Insufficient documentation

## 2023-05-26 LAB — RAD ONC ARIA SESSION SUMMARY
Course Elapsed Days: 16
Plan Fractions Treated to Date: 13
Plan Prescribed Dose Per Fraction: 1.8 Gy
Plan Total Fractions Prescribed: 25
Plan Total Prescribed Dose: 45 Gy
Reference Point Dosage Given to Date: 23.4 Gy
Reference Point Session Dosage Given: 1.8 Gy
Session Number: 13

## 2023-05-26 LAB — COMPREHENSIVE METABOLIC PANEL
ALT: 14 U/L (ref 0–44)
AST: 32 U/L (ref 15–41)
Albumin: 2.3 g/dL — ABNORMAL LOW (ref 3.5–5.0)
Alkaline Phosphatase: 131 U/L — ABNORMAL HIGH (ref 38–126)
Anion gap: 10 (ref 5–15)
BUN: 7 mg/dL — ABNORMAL LOW (ref 8–23)
CO2: 22 mmol/L (ref 22–32)
Calcium: 7.7 mg/dL — ABNORMAL LOW (ref 8.9–10.3)
Chloride: 103 mmol/L (ref 98–111)
Creatinine, Ser: 0.74 mg/dL (ref 0.44–1.00)
GFR, Estimated: >60 mL/min (ref >60)
Glucose, Bld: 129 mg/dL — ABNORMAL HIGH (ref 70–99)
Potassium: 2.6 mmol/L — CL (ref 3.5–5.1)
Sodium: 135 mmol/L (ref 135–145)
Total Bilirubin: 0.9 mg/dL (ref 0.3–1.2)
Total Protein: 5.5 g/dL — ABNORMAL LOW (ref 6.5–8.1)

## 2023-05-26 LAB — CBC WITH DIFFERENTIAL/PLATELET
Abs Immature Granulocytes: 0.02 10*3/uL (ref 0.00–0.07)
Basophils Absolute: 0 10*3/uL (ref 0.0–0.1)
Basophils Relative: 1 %
Eosinophils Absolute: 0.1 10*3/uL (ref 0.0–0.5)
Eosinophils Relative: 2 %
HCT: 36.8 % (ref 36.0–46.0)
Hemoglobin: 11.5 g/dL — ABNORMAL LOW (ref 12.0–15.0)
Immature Granulocytes: 0 %
Lymphocytes Relative: 15 %
Lymphs Abs: 0.7 10*3/uL (ref 0.7–4.0)
MCH: 31.4 pg (ref 26.0–34.0)
MCHC: 31.3 g/dL (ref 30.0–36.0)
MCV: 100.5 fL — ABNORMAL HIGH (ref 80.0–100.0)
Monocytes Absolute: 0.4 10*3/uL (ref 0.1–1.0)
Monocytes Relative: 8 %
Neutro Abs: 3.5 10*3/uL (ref 1.7–7.7)
Neutrophils Relative %: 74 %
Platelets: 118 10*3/uL — ABNORMAL LOW (ref 150–400)
RBC: 3.66 MIL/uL — ABNORMAL LOW (ref 3.87–5.11)
RDW: 18.1 % — ABNORMAL HIGH (ref 11.5–15.5)
WBC: 4.8 10*3/uL (ref 4.0–10.5)
nRBC: 0 % (ref 0.0–0.2)

## 2023-05-26 LAB — SAMPLE TO BLOOD BANK

## 2023-05-26 LAB — MAGNESIUM: Magnesium: 1.4 mg/dL — ABNORMAL LOW (ref 1.7–2.4)

## 2023-05-26 MED ORDER — LOPERAMIDE HCL 2 MG PO CAPS
4.0000 mg | ORAL_CAPSULE | ORAL | Status: DC | PRN
  Administered 2023-05-26: 4 mg via ORAL
  Filled 2023-05-26: qty 2

## 2023-05-26 MED ORDER — MAGNESIUM SULFATE 2 GM/50ML IV SOLN
2.0000 g | Freq: Once | INTRAVENOUS | Status: AC
  Administered 2023-05-26: 2 g via INTRAVENOUS
  Filled 2023-05-26: qty 50

## 2023-05-26 MED ORDER — HEPARIN SOD (PORK) LOCK FLUSH 100 UNIT/ML IV SOLN
500.0000 [IU] | Freq: Once | INTRAVENOUS | Status: AC | PRN
  Administered 2023-05-26: 500 [IU]

## 2023-05-26 MED ORDER — POTASSIUM CHLORIDE IN NACL 20-0.9 MEQ/L-% IV SOLN
Freq: Once | INTRAVENOUS | Status: AC
  Filled 2023-05-26: qty 1000

## 2023-05-26 MED ORDER — POTASSIUM CHLORIDE 20 MEQ PO PACK
40.0000 meq | PACK | ORAL | Status: AC
  Administered 2023-05-26 (×2): 40 meq via ORAL
  Filled 2023-05-26: qty 2

## 2023-05-26 MED ORDER — SODIUM CHLORIDE 0.9% FLUSH
10.0000 mL | Freq: Once | INTRAVENOUS | Status: AC | PRN
  Administered 2023-05-26: 10 mL

## 2023-05-26 MED ORDER — MAGNESIUM SULFATE 2 GM/50ML IV SOLN: 2.0000 g | INTRAVENOUS | Status: DC

## 2023-05-26 NOTE — Progress Notes (Signed)
CRITICAL VALUE ALERT Critical value received:  Potassium 2.6. Magnesium 1.4. Date of notification:  05-26-2023 Time of notification: 09:10 am.  Critical value read back:  Yes.   Nurse who received alert:  B.Yoshiharu Brassell Rn.  MD notified time and response: Dr. Ellin Saba @ 09:17 am. Standing orders .

## 2023-05-26 NOTE — Patient Instructions (Addendum)
[facility] at [facility] Discharge Instructions   You were seen and examined today by Dr. Ellin Saba.  He reviewed the results of your lab work. Your potassium and magnesium are very low. We will give IV potassium and magnesium in the clinic today. All other labs were normal/stable.   Stop taking Xeloda this week. If the diarrhea is better, you may restart Xeloda pills on Monday, 7/8.   Return as scheduled.    Thank you for choosing [facility] at [facility] to provide your oncology and hematology care.  To afford each patient quality time with our provider, please arrive at least 15 minutes before your scheduled appointment time.   If you have a lab appointment with the Cancer Center please come in thru the Main Entrance and check in at the main information desk.  You need to re-schedule your appointment should you arrive 10 or more minutes late.  We strive to give you quality time with our providers, and arriving late affects you and other patients whose appointments are after yours.  Also, if you no show three or more times for appointments you may be dismissed from the clinic at the providers discretion.     Again, thank you for choosing [facility].  Our hope is that these requests will decrease the amount of time that you wait before being seen by our physicians.       _____________________________________________________________  Should you have questions after your visit to [facility], please contact our office at [phone] and follow the prompts.  Our office hours are 8:00 a.m. and 4:30 p.m. Monday - Friday.  Please note that voicemails left after 4:00 p.m. may not be returned until the following business day.  We are closed weekends and major holidays.  You do have access to a nurse 24-7, just call the main number to the clinic [phone] and do not press any options, hold on the line and a nurse will  answer the phone.    For prescription refill requests, have your pharmacy contact our office and allow 72 hours.    Due to Covid, you will need to wear a mask upon entering the hospital. If you do not have a mask, a mask will be given to you at the Main Entrance upon arrival. For doctor visits, patients may have 1 support person age 6 or older with them. For treatment visits, patients can not have anyone with them due to social distancing guidelines and our immunocompromised population.

## 2023-05-26 NOTE — Patient Instructions (Signed)
[facility]  Discharge Instructions: Thank you for choosing [facility] to provide your oncology and hematology care.  If you have a lab appointment with the Cancer Center - please note that after April 8th, 2024, all labs will be drawn in the cancer center.  You do not have to check in or register with the main entrance as you have in the past but will complete your check-in in the cancer center.  Wear comfortable clothing and clothing appropriate for easy access to any Portacath or PICC line.   We strive to give you quality time with your provider. You may need to reschedule your appointment if you arrive late (15 or more minutes).  Arriving late affects you and other patients whose appointments are after yours.  Also, if you miss three or more appointments without notifying the office, you may be dismissed from the clinic at the provider's discretion.      For prescription refill requests, have your pharmacy contact our office and allow 72 hours for refills to be completed.    Today you received hydration fluids with potassium and magnesium   To help prevent nausea and vomiting after your treatment, we encourage you to take your nausea medication as directed.  BELOW ARE SYMPTOMS THAT SHOULD BE REPORTED IMMEDIATELY: *FEVER GREATER THAN 100.4 F (38 C) OR HIGHER *CHILLS OR SWEATING *NAUSEA AND VOMITING THAT IS NOT CONTROLLED WITH YOUR NAUSEA MEDICATION *UNUSUAL SHORTNESS OF BREATH *UNUSUAL BRUISING OR BLEEDING *URINARY PROBLEMS (pain or burning when urinating, or frequent urination) *BOWEL PROBLEMS (unusual diarrhea, constipation, pain near the anus) TENDERNESS IN MOUTH AND THROAT WITH OR WITHOUT PRESENCE OF ULCERS (sore throat, sores in mouth, or a toothache) UNUSUAL RASH, SWELLING OR PAIN  UNUSUAL VAGINAL DISCHARGE OR ITCHING   Items with * indicate a potential emergency and should be followed up as soon as possible or go to the Emergency Department if  any problems should occur.  Please show the CHEMOTHERAPY ALERT CARD or IMMUNOTHERAPY ALERT CARD at check-in to the Emergency Department and triage nurse.  Should you have questions after your visit or need to cancel or reschedule your appointment, please contact [facility] [phone]  and follow the prompts.  Office hours are 8:00 a.m. to 4:30 p.m. Monday - Friday. Please note that voicemails left after 4:00 p.m. may not be returned until the following business day.  We are closed weekends and major holidays. You have access to a nurse at all times for urgent questions. Please call the main number to the clinic [phone] and follow the prompts.  For any non-urgent questions, you may also contact your provider using MyChart. We now offer e-Visits for anyone 18 and older to request care online for non-urgent symptoms. For details visit [email or web address].   Also download the MyChart app! Go to the app store, search "MyChart", open the app, select [facility], and log in with your MyChart username and password.   

## 2023-05-26 NOTE — Progress Notes (Signed)
Patient complaining of diarrhea, has had multiple episodes this morning, will give imodium per MD orders.  Hydration fluids with potassium and magnesium given per orders. Patient tolerated it well without problems. Vitals stable and discharged home from clinic via wheelchair.. Follow up as scheduled.

## 2023-05-28 ENCOUNTER — Ambulatory Visit: Payer: Medicare HMO

## 2023-05-28 ENCOUNTER — Other Ambulatory Visit: Payer: Self-pay

## 2023-05-28 ENCOUNTER — Ambulatory Visit: Admission: RE | Admit: 2023-05-28 | Payer: Medicare HMO | Source: Ambulatory Visit

## 2023-05-28 ENCOUNTER — Ambulatory Visit
Admission: RE | Admit: 2023-05-28 | Discharge: 2023-05-28 | Disposition: A | Discharge status: Home / Self Care | Payer: Medicare HMO | Source: Ambulatory Visit | Attending: Radiation Oncology | Admitting: Radiation Oncology

## 2023-05-28 DIAGNOSIS — Z510 Encounter for antineoplastic radiation therapy: Secondary | ICD-10-CM | POA: Diagnosis not present

## 2023-05-28 LAB — RAD ONC ARIA SESSION SUMMARY
Course Elapsed Days: 18
Plan Fractions Treated to Date: 14
Plan Prescribed Dose Per Fraction: 1.8 Gy
Plan Total Fractions Prescribed: 25
Plan Total Prescribed Dose: 45 Gy
Reference Point Dosage Given to Date: 25.2 Gy
Reference Point Session Dosage Given: 1.8 Gy
Session Number: 14

## 2023-05-31 ENCOUNTER — Ambulatory Visit: Payer: Medicare HMO

## 2023-06-01 ENCOUNTER — Emergency Department (HOSPITAL_COMMUNITY): Payer: Medicare HMO

## 2023-06-01 ENCOUNTER — Ambulatory Visit: Payer: Medicare HMO

## 2023-06-01 ENCOUNTER — Inpatient Hospital Stay (HOSPITAL_COMMUNITY)
Admission: EM | Admit: 2023-06-01 | Discharge: 2023-06-07 | DRG: 871 | Disposition: A | Discharge status: Skilled Nursing Facility | Payer: Medicare HMO | Attending: Internal Medicine | Admitting: Internal Medicine

## 2023-06-01 ENCOUNTER — Other Ambulatory Visit: Payer: Self-pay

## 2023-06-01 ENCOUNTER — Encounter (HOSPITAL_COMMUNITY): Payer: Self-pay | Admitting: Family Medicine

## 2023-06-01 DIAGNOSIS — E118 Type 2 diabetes mellitus with unspecified complications: Secondary | ICD-10-CM

## 2023-06-01 DIAGNOSIS — S298XXA Other specified injuries of thorax, initial encounter: Secondary | ICD-10-CM

## 2023-06-01 DIAGNOSIS — E860 Dehydration: Secondary | ICD-10-CM | POA: Diagnosis not present

## 2023-06-01 DIAGNOSIS — S40812A Abrasion of left upper arm, initial encounter: Secondary | ICD-10-CM | POA: Diagnosis present

## 2023-06-01 DIAGNOSIS — E66812 Obesity, class 2: Secondary | ICD-10-CM | POA: Diagnosis present

## 2023-06-01 DIAGNOSIS — R110 Nausea: Secondary | ICD-10-CM

## 2023-06-01 DIAGNOSIS — R627 Adult failure to thrive: Secondary | ICD-10-CM | POA: Diagnosis present

## 2023-06-01 DIAGNOSIS — D61818 Other pancytopenia: Secondary | ICD-10-CM | POA: Diagnosis present

## 2023-06-01 DIAGNOSIS — Z794 Long term (current) use of insulin: Secondary | ICD-10-CM

## 2023-06-01 DIAGNOSIS — A419 Sepsis, unspecified organism: Secondary | ICD-10-CM | POA: Diagnosis not present

## 2023-06-01 DIAGNOSIS — E43 Unspecified severe protein-calorie malnutrition: Secondary | ICD-10-CM | POA: Diagnosis present

## 2023-06-01 DIAGNOSIS — I2510 Atherosclerotic heart disease of native coronary artery without angina pectoris: Secondary | ICD-10-CM | POA: Diagnosis present

## 2023-06-01 DIAGNOSIS — Z8541 Personal history of malignant neoplasm of cervix uteri: Secondary | ICD-10-CM

## 2023-06-01 DIAGNOSIS — Z823 Family history of stroke: Secondary | ICD-10-CM

## 2023-06-01 DIAGNOSIS — S40811A Abrasion of right upper arm, initial encounter: Secondary | ICD-10-CM | POA: Diagnosis present

## 2023-06-01 DIAGNOSIS — I10 Essential (primary) hypertension: Secondary | ICD-10-CM | POA: Diagnosis present

## 2023-06-01 DIAGNOSIS — Z90710 Acquired absence of both cervix and uterus: Secondary | ICD-10-CM

## 2023-06-01 DIAGNOSIS — N179 Acute kidney failure, unspecified: Secondary | ICD-10-CM | POA: Diagnosis present

## 2023-06-01 DIAGNOSIS — F3341 Major depressive disorder, recurrent, in partial remission: Secondary | ICD-10-CM | POA: Diagnosis present

## 2023-06-01 DIAGNOSIS — R296 Repeated falls: Secondary | ICD-10-CM | POA: Diagnosis present

## 2023-06-01 DIAGNOSIS — E559 Vitamin D deficiency, unspecified: Secondary | ICD-10-CM | POA: Diagnosis present

## 2023-06-01 DIAGNOSIS — W19XXXA Unspecified fall, initial encounter: Principal | ICD-10-CM

## 2023-06-01 DIAGNOSIS — E871 Hypo-osmolality and hyponatremia: Secondary | ICD-10-CM | POA: Diagnosis present

## 2023-06-01 DIAGNOSIS — Z923 Personal history of irradiation: Secondary | ICD-10-CM

## 2023-06-01 DIAGNOSIS — L89891 Pressure ulcer of other site, stage 1: Secondary | ICD-10-CM | POA: Diagnosis present

## 2023-06-01 DIAGNOSIS — Z91041 Radiographic dye allergy status: Secondary | ICD-10-CM

## 2023-06-01 DIAGNOSIS — Z882 Allergy status to sulfonamides status: Secondary | ICD-10-CM

## 2023-06-01 DIAGNOSIS — E8809 Other disorders of plasma-protein metabolism, not elsewhere classified: Secondary | ICD-10-CM | POA: Diagnosis present

## 2023-06-01 DIAGNOSIS — L8990 Pressure ulcer of unspecified site, unspecified stage: Secondary | ICD-10-CM | POA: Insufficient documentation

## 2023-06-01 DIAGNOSIS — E861 Hypovolemia: Secondary | ICD-10-CM | POA: Diagnosis present

## 2023-06-01 DIAGNOSIS — F05 Delirium due to known physiological condition: Secondary | ICD-10-CM | POA: Diagnosis present

## 2023-06-01 DIAGNOSIS — N390 Urinary tract infection, site not specified: Secondary | ICD-10-CM | POA: Diagnosis present

## 2023-06-01 DIAGNOSIS — E890 Postprocedural hypothyroidism: Secondary | ICD-10-CM | POA: Diagnosis present

## 2023-06-01 DIAGNOSIS — E872 Acidosis, unspecified: Secondary | ICD-10-CM | POA: Diagnosis present

## 2023-06-01 DIAGNOSIS — R32 Unspecified urinary incontinence: Secondary | ICD-10-CM | POA: Diagnosis present

## 2023-06-01 DIAGNOSIS — C20 Malignant neoplasm of rectum: Secondary | ICD-10-CM | POA: Diagnosis present

## 2023-06-01 DIAGNOSIS — Z9221 Personal history of antineoplastic chemotherapy: Secondary | ICD-10-CM

## 2023-06-01 DIAGNOSIS — S060X0A Concussion without loss of consciousness, initial encounter: Secondary | ICD-10-CM

## 2023-06-01 DIAGNOSIS — G893 Neoplasm related pain (acute) (chronic): Secondary | ICD-10-CM

## 2023-06-01 DIAGNOSIS — W06XXXA Fall from bed, initial encounter: Secondary | ICD-10-CM | POA: Diagnosis present

## 2023-06-01 DIAGNOSIS — Z8249 Family history of ischemic heart disease and other diseases of the circulatory system: Secondary | ICD-10-CM

## 2023-06-01 DIAGNOSIS — Z6836 Body mass index (BMI) 36.0-36.9, adult: Secondary | ICD-10-CM

## 2023-06-01 DIAGNOSIS — Z79899 Other long term (current) drug therapy: Secondary | ICD-10-CM

## 2023-06-01 DIAGNOSIS — R338 Other retention of urine: Secondary | ICD-10-CM

## 2023-06-01 DIAGNOSIS — Z888 Allergy status to other drugs, medicaments and biological substances status: Secondary | ICD-10-CM

## 2023-06-01 DIAGNOSIS — Y92003 Bedroom of unspecified non-institutional (private) residence as the place of occurrence of the external cause: Secondary | ICD-10-CM

## 2023-06-01 DIAGNOSIS — K219 Gastro-esophageal reflux disease without esophagitis: Secondary | ICD-10-CM | POA: Diagnosis present

## 2023-06-01 DIAGNOSIS — Z79890 Hormone replacement therapy (postmenopausal): Secondary | ICD-10-CM

## 2023-06-01 DIAGNOSIS — G9341 Metabolic encephalopathy: Secondary | ICD-10-CM | POA: Diagnosis present

## 2023-06-01 DIAGNOSIS — E876 Hypokalemia: Secondary | ICD-10-CM | POA: Diagnosis present

## 2023-06-01 DIAGNOSIS — Z833 Family history of diabetes mellitus: Secondary | ICD-10-CM

## 2023-06-01 DIAGNOSIS — R1115 Cyclical vomiting syndrome unrelated to migraine: Secondary | ICD-10-CM | POA: Diagnosis present

## 2023-06-01 DIAGNOSIS — R531 Weakness: Secondary | ICD-10-CM

## 2023-06-01 DIAGNOSIS — R3911 Hesitancy of micturition: Secondary | ICD-10-CM | POA: Diagnosis present

## 2023-06-01 DIAGNOSIS — S161XXA Strain of muscle, fascia and tendon at neck level, initial encounter: Secondary | ICD-10-CM

## 2023-06-01 DIAGNOSIS — L89151 Pressure ulcer of sacral region, stage 1: Secondary | ICD-10-CM | POA: Diagnosis present

## 2023-06-01 DIAGNOSIS — E119 Type 2 diabetes mellitus without complications: Secondary | ICD-10-CM | POA: Diagnosis present

## 2023-06-01 DIAGNOSIS — Z66 Do not resuscitate: Secondary | ICD-10-CM | POA: Diagnosis present

## 2023-06-01 LAB — MAGNESIUM: Magnesium: 1.3 mg/dL — ABNORMAL LOW (ref 1.7–2.4)

## 2023-06-01 LAB — COMPREHENSIVE METABOLIC PANEL
ALT: 13 U/L (ref 0–44)
AST: 33 U/L (ref 15–41)
Albumin: 1.9 g/dL — ABNORMAL LOW (ref 3.5–5.0)
Alkaline Phosphatase: 133 U/L — ABNORMAL HIGH (ref 38–126)
Anion gap: 12 (ref 5–15)
BUN: 9 mg/dL (ref 8–23)
CO2: 21 mmol/L — ABNORMAL LOW (ref 22–32)
Calcium: 7.1 mg/dL — ABNORMAL LOW (ref 8.9–10.3)
Chloride: 99 mmol/L (ref 98–111)
Creatinine, Ser: 1.17 mg/dL — ABNORMAL HIGH (ref 0.44–1.00)
GFR, Estimated: 52 mL/min — ABNORMAL LOW (ref >60)
Glucose, Bld: 113 mg/dL — ABNORMAL HIGH (ref 70–99)
Potassium: 2.9 mmol/L — ABNORMAL LOW (ref 3.5–5.1)
Sodium: 132 mmol/L — ABNORMAL LOW (ref 135–145)
Total Bilirubin: 1.1 mg/dL (ref 0.3–1.2)
Total Protein: 4.9 g/dL — ABNORMAL LOW (ref 6.5–8.1)

## 2023-06-01 LAB — CBC
HCT: 34.3 % — ABNORMAL LOW (ref 36.0–46.0)
Hemoglobin: 10.7 g/dL — ABNORMAL LOW (ref 12.0–15.0)
MCH: 31.4 pg (ref 26.0–34.0)
MCHC: 31.2 g/dL (ref 30.0–36.0)
MCV: 100.6 fL — ABNORMAL HIGH (ref 80.0–100.0)
Platelets: 111 10*3/uL — ABNORMAL LOW (ref 150–400)
RBC: 3.41 MIL/uL — ABNORMAL LOW (ref 3.87–5.11)
RDW: 17.4 % — ABNORMAL HIGH (ref 11.5–15.5)
WBC: 5.5 10*3/uL (ref 4.0–10.5)
nRBC: 0 % (ref 0.0–0.2)

## 2023-06-01 LAB — URINALYSIS, ROUTINE W REFLEX MICROSCOPIC
Bilirubin Urine: NEGATIVE
Glucose, UA: NEGATIVE mg/dL
Ketones, ur: NEGATIVE mg/dL
Nitrite: NEGATIVE
Protein, ur: 30 mg/dL — AB
RBC / HPF: >50 RBC/hpf (ref 0–5)
Specific Gravity, Urine: 1.013 (ref 1.005–1.030)
WBC, UA: >50 WBC/hpf (ref 0–5)
pH: 5 (ref 5.0–8.0)

## 2023-06-01 LAB — BASIC METABOLIC PANEL
Anion gap: 8 (ref 5–15)
BUN: 9 mg/dL (ref 8–23)
CO2: 22 mmol/L (ref 22–32)
Calcium: 6.8 mg/dL — ABNORMAL LOW (ref 8.9–10.3)
Chloride: 101 mmol/L (ref 98–111)
Creatinine, Ser: 1.04 mg/dL — ABNORMAL HIGH (ref 0.44–1.00)
GFR, Estimated: 60 mL/min — ABNORMAL LOW (ref >60)
Glucose, Bld: 126 mg/dL — ABNORMAL HIGH (ref 70–99)
Potassium: 3.5 mmol/L (ref 3.5–5.1)
Sodium: 131 mmol/L — ABNORMAL LOW (ref 135–145)

## 2023-06-01 LAB — LIPASE, BLOOD: Lipase: 26 U/L (ref 11–51)

## 2023-06-01 LAB — GLUCOSE, CAPILLARY
Glucose-Capillary: 131 mg/dL — ABNORMAL HIGH (ref 70–99)
Glucose-Capillary: 114 mg/dL — ABNORMAL HIGH (ref 70–99)
Glucose-Capillary: 113 mg/dL — ABNORMAL HIGH (ref 70–99)

## 2023-06-01 MED ORDER — LACTATED RINGERS IV BOLUS
500.0000 mL | Freq: Once | INTRAVENOUS | Status: AC
  Administered 2023-06-01: 500 mL via INTRAVENOUS

## 2023-06-01 MED ORDER — MAGNESIUM OXIDE -MG SUPPLEMENT 400 (240 MG) MG PO TABS
800.0000 mg | ORAL_TABLET | Freq: Every day | ORAL | Status: DC
  Administered 2023-06-02 – 2023-06-07 (×6): 800 mg via ORAL
  Filled 2023-06-01 (×7): qty 2

## 2023-06-01 MED ORDER — FENTANYL CITRATE PF 50 MCG/ML IJ SOSY
50.0000 ug | PREFILLED_SYRINGE | Freq: Once | INTRAMUSCULAR | Status: AC
  Administered 2023-06-01: 50 ug via INTRAVENOUS
  Filled 2023-06-01: qty 1

## 2023-06-01 MED ORDER — LEVOTHYROXINE SODIUM 88 MCG PO TABS
88.0000 ug | ORAL_TABLET | Freq: Every day | ORAL | Status: DC
  Administered 2023-06-02 – 2023-06-07 (×6): 88 ug via ORAL
  Filled 2023-06-01 (×6): qty 1

## 2023-06-01 MED ORDER — OLANZAPINE 5 MG PO TABS
10.0000 mg | ORAL_TABLET | Freq: Every day | ORAL | Status: DC
  Administered 2023-06-01: 10 mg via ORAL
  Filled 2023-06-01: qty 2

## 2023-06-01 MED ORDER — ONDANSETRON HCL 4 MG/2ML IJ SOLN
4.0000 mg | Freq: Four times a day (QID) | INTRAMUSCULAR | Status: DC | PRN
  Administered 2023-06-02: 4 mg via INTRAVENOUS
  Filled 2023-06-01: qty 2

## 2023-06-01 MED ORDER — ENOXAPARIN SODIUM 60 MG/0.6ML IJ SOSY
55.0000 mg | PREFILLED_SYRINGE | INTRAMUSCULAR | Status: DC
  Administered 2023-06-01 – 2023-06-06 (×6): 55 mg via SUBCUTANEOUS
  Filled 2023-06-01 (×6): qty 0.6

## 2023-06-01 MED ORDER — SODIUM CHLORIDE 0.9 % IV BOLUS
500.0000 mL | Freq: Once | INTRAVENOUS | Status: AC
  Administered 2023-06-01: 500 mL via INTRAVENOUS

## 2023-06-01 MED ORDER — PROCHLORPERAZINE EDISYLATE 10 MG/2ML IJ SOLN
10.0000 mg | Freq: Four times a day (QID) | INTRAMUSCULAR | Status: DC | PRN
  Administered 2023-06-02: 10 mg via INTRAVENOUS
  Filled 2023-06-01: qty 2

## 2023-06-01 MED ORDER — INSULIN ASPART 100 UNIT/ML IJ SOLN: 0.0000 [IU] | Freq: Every day | INTRAMUSCULAR | Status: DC

## 2023-06-01 MED ORDER — INSULIN ASPART 100 UNIT/ML IJ SOLN
0.0000 [IU] | Freq: Three times a day (TID) | INTRAMUSCULAR | Status: DC
  Administered 2023-06-02 – 2023-06-03 (×3): 2 [IU] via SUBCUTANEOUS
  Administered 2023-06-03 – 2023-06-05 (×4): 3 [IU] via SUBCUTANEOUS
  Administered 2023-06-06 – 2023-06-07 (×3): 2 [IU] via SUBCUTANEOUS

## 2023-06-01 MED ORDER — MAGNESIUM SULFATE 2 GM/50ML IV SOLN
2.0000 g | Freq: Once | INTRAVENOUS | Status: AC
  Administered 2023-06-01: 2 g via INTRAVENOUS
  Filled 2023-06-01: qty 50

## 2023-06-01 MED ORDER — PANTOPRAZOLE SODIUM 40 MG PO TBEC
40.0000 mg | DELAYED_RELEASE_TABLET | Freq: Two times a day (BID) | ORAL | Status: DC
  Administered 2023-06-01 – 2023-06-07 (×12): 40 mg via ORAL
  Filled 2023-06-01 (×12): qty 1

## 2023-06-01 MED ORDER — ACETAMINOPHEN 325 MG PO TABS
650.0000 mg | ORAL_TABLET | Freq: Four times a day (QID) | ORAL | Status: DC | PRN
  Administered 2023-06-02 – 2023-06-06 (×5): 650 mg via ORAL
  Filled 2023-06-01 (×5): qty 2

## 2023-06-01 MED ORDER — ACETAMINOPHEN 650 MG RE SUPP: 650.0000 mg | Freq: Four times a day (QID) | RECTAL | Status: DC | PRN

## 2023-06-01 MED ORDER — ONDANSETRON HCL 4 MG/2ML IJ SOLN
4.0000 mg | Freq: Once | INTRAMUSCULAR | Status: AC
  Administered 2023-06-01: 4 mg via INTRAVENOUS
  Filled 2023-06-01: qty 2

## 2023-06-01 MED ORDER — NYSTATIN 100000 UNIT/GM EX POWD
Freq: Three times a day (TID) | CUTANEOUS | Status: DC
  Filled 2023-06-01 (×3): qty 15

## 2023-06-01 MED ORDER — SODIUM CHLORIDE 0.9 % IV BOLUS
1000.0000 mL | Freq: Once | INTRAVENOUS | Status: AC
  Administered 2023-06-01: 1000 mL via INTRAVENOUS

## 2023-06-01 MED ORDER — POTASSIUM CHLORIDE 10 MEQ/100ML IV SOLN
10.0000 meq | Freq: Once | INTRAVENOUS | Status: AC
  Administered 2023-06-01: 10 meq via INTRAVENOUS
  Filled 2023-06-01: qty 100

## 2023-06-01 MED ORDER — POTASSIUM CHLORIDE 2 MEQ/ML IV SOLN
INTRAVENOUS | Status: DC
  Filled 2023-06-01 (×11): qty 1000

## 2023-06-01 MED ORDER — SODIUM CHLORIDE 0.9% FLUSH
3.0000 mL | Freq: Two times a day (BID) | INTRAVENOUS | Status: DC
  Administered 2023-06-01 – 2023-06-03 (×4): 3 mL via INTRAVENOUS

## 2023-06-01 MED ORDER — MORPHINE SULFATE (PF) 2 MG/ML IV SOLN
2.0000 mg | INTRAVENOUS | Status: DC | PRN
  Administered 2023-06-01 – 2023-06-03 (×4): 2 mg via INTRAVENOUS
  Filled 2023-06-01 (×4): qty 1

## 2023-06-01 NOTE — H&P (Signed)
History and Physical    Patient: [name] YNW:[id number] DOB: Apr 07, 1956 DOA: 06/01/2023 DOS: the patient was seen and examined on 06/01/2023 PCP: Gwenlyn Found, MD  Patient coming from: Home  Chief Complaint:  Chief Complaint  Patient presents with   Fall   HPI: [name] is a 67 y.o. female with a history of rectal CA s/p chemo undergoing XRT, HTN, T2DM, HERD, hypothyroidism, anemia and thrombocytopenia who presented from home by EMS after a fall at home. She has been feeling gradually more weak diffusely for several weeks since starting radiation therapy (next due today) and having diarrhea regularly, not taking adequate oral intake over past week, and often has vomiting. She came in after a trip and fall at home today despite using her rolling walker. She feels "terrible." Denies fever, chills, HA, chest pain, cough, dyspnea, abd pain, dysuria, frequent or urgent urination, overt GI bleeding. She bruises very easily, not on blood thinner, and has had scrapes from falls but denies any specific pain at this time. She was afebrile with normal WBC, blood counts at their baselines. SCr 1.17 with K 2.9 indicating AKI. Radiographic work up has been negative for acute fracture or dislocations. IVF, zofran, K supplementation was given and admission requested.   Review of Systems: As mentioned in the history of present illness. All other systems reviewed and are negative. Past Medical History:  Diagnosis Date   Anxiety    Cancer (HCC)    Coronary atherosclerosis    Cardiac CT 09/2018 with calcium score 8.7 and mild proximal LAD disease   Essential hypertension 2009   GERD (gastroesophageal reflux disease)    Hypothyroidism    Iron deficiency anemia    Osteoarthritis    Palpitations    Tachypalpitations on beta blockers since 2010   Type 2 diabetes mellitus (HCC) 2003   Vitamin B 12 deficiency    Vitamin D deficiency    Past Surgical History:  Procedure Laterality Date   ABDOMINAL  HYSTERECTOMY     History of cervical cancer   BIOPSY  12/16/2022   Procedure: BIOPSY;  Surgeon: Corbin Ade, MD;  Location: [facility];  Service: Endoscopy;;  gastric, rectal   BREAST BIOPSY Right    COLONOSCOPY WITH PROPOFOL N/A 12/16/2022   Procedure: COLONOSCOPY WITH PROPOFOL;  Surgeon: Corbin Ade, MD;  Location: [facility];  Service: Endoscopy;  Laterality: N/A;  10:45 am   ESOPHAGOGASTRODUODENOSCOPY (EGD) WITH PROPOFOL N/A 12/16/2022   Procedure: ESOPHAGOGASTRODUODENOSCOPY (EGD) WITH PROPOFOL;  Surgeon: Corbin Ade, MD;  Location: [facility];  Service: Endoscopy;  Laterality: N/A;   IR IMAGING GUIDED PORT INSERTION  01/15/2023   MALONEY DILATION N/A 12/16/2022   Procedure: Elease Hashimoto DILATION;  Surgeon: Corbin Ade, MD;  Location: [facility];  Service: Endoscopy;  Laterality: N/A;   POLYPECTOMY  12/16/2022   Procedure: POLYPECTOMY;  Surgeon: Corbin Ade, MD;  Location: [facility];  Service: Endoscopy;;   TONSILLECTOMY     Social History:  reports that she has never smoked. She has never used smokeless tobacco. She reports that she does not drink alcohol and does not use drugs.  Allergies  Allergen Reactions   Contrast Media [Iodinated Contrast Media] Other (See Comments)    Burning   Sulfa Antibiotics Nausea And Vomiting    Other reaction(s): GI Upset (intolerance) unknown   Doxepin Rash and Swelling   Tape Rash    Family History  Problem Relation Age of Onset  Heart failure Mother    Diabetes Father    Hypertension Father    Stroke Sister 63   Diabetes Brother    Cancer - Colon Other        age 31   Colon polyps Neg Hx     Prior to Admission medications   Medication Sig Start Date End Date Taking? Authorizing Provider  blood glucose meter kit and supplies KIT Dispense based on patient and insurance preference. Use up to four times daily as directed. (FOR ICD-10 E11.65) Number of Strips 400 Number of Lancets 400 02/16/23   Roma Kayser, MD  Blood Glucose Monitoring Suppl (TRUE METRIX METER) w/Device KIT 1 each by Does not apply route 2 (two) times daily. Use to test BG bid E11.65 03/19/23   Dani Gobble, NP  diphenoxylate-atropine (LOMOTIL) 2.5-0.025 MG tablet Take 1 tablet by mouth 4 (four) times daily. Patient taking differently: Take 2 tablets by mouth 4 (four) times daily. 03/25/23   Carnella Guadalajara, PA-C  Feeding Tubes (FEEDING TUBE ATTACHMENT DEVICE) MISC 3 in 1 Bedside commode 04/27/23   [provider]  FLUOROURACIL IV Inject into the vein every 14 (fourteen) days. 01/20/23   [provider]  FLUoxetine (PROZAC) 20 MG capsule Take 20 mg by mouth daily. 05/07/23   [provider]  HYDROcodone-acetaminophen (NORCO/VICODIN) 5-325 MG tablet TAKE (1) TABLET BY MOUTH EVERY (6) HOURS AS NEEDED. 05/07/23   Buford Dresser, Lupe Carney M, PA-C  insulin aspart (NOVOLOG FLEXPEN) 100 UNIT/ML FlexPen INJECT 0 TO 6 UNITS UNDER SKIN THREE TIMES DAILY WITH MEALS (DISCARD PEN 28 DAYS AFTER OPENING) 05/25/23   Dani Gobble, NP  insulin glargine (LANTUS SOLOSTAR) 100 UNIT/ML Solostar Pen INJECT 40 UNITS UNDER THE SKIN AT BEDTIME 05/25/23   Reardon, Freddi Starr, NP  Insulin Pen Needle (DROPLET PEN NEEDLES) 31G X 5 MM MISC USE AS INSTRUCTED TO INJECT INSULIN FOUR TIMES DAILY 05/26/23   Dani Gobble, NP  IRINOTECAN HCL IV Inject into the vein every 14 (fourteen) days. 01/20/23   [provider]  LEUCOVORIN CALCIUM IV Inject into the vein every 14 (fourteen) days. 01/20/23   [provider]  levothyroxine (SYNTHROID) 88 MCG tablet TAKE 1 TABLET EVERY DAY BEFORE BREAKFAST Patient taking differently: Take 88 mcg by mouth daily before breakfast. 04/09/23   Dani Gobble, NP  lidocaine (XYLOCAINE) 2 % solution  04/13/23   [provider]  loperamide (IMODIUM) 2 MG capsule Take 1 capsule (2 mg total) by mouth as needed for diarrhea or loose stools. TAKE 1 CAPSULE AS DIRECTED AS  NEEDED FOR DIARRHEA OR LOOSE STOOLS 04/14/23   Rojelio Brenner M, PA-C  magnesium oxide (MAG-OX) 400 (240 Mg) MG tablet Take 2 tablets (800 mg total) by mouth daily. 05/19/23   [name], MD  Misc. Devices (ROLLATOR ULTRA-LIGHT) MISC Rollator walker 04/27/23   [provider]  OLANZapine (ZYPREXA) 5 MG tablet Take 2 tablets (10 mg total) by mouth at bedtime. 04/05/23   Carnella Guadalajara, PA-C  ondansetron (ZOFRAN-ODT) 4 MG disintegrating tablet Take 1 tablet (4 mg total) by mouth every 8 (eight) hours as needed for nausea or vomiting. DISSOLVE 2 TAB ON TONGUE EVERY 8HR AS NEEDED NAUSEA, VOMITING (BETWEEN COMPAZINE DOSES FOR MAX RELIEF) Strength: 4 mg 04/13/23   Shon Hale, MD  OXALIPLATIN IV Inject into the vein every 14 (fourteen) days. 01/20/23   [provider]  pantoprazole (PROTONIX) 40 MG tablet Take 1 tablet (40 mg total) by mouth  2 (two) times daily before a meal. 04/13/23   Emokpae, Courage, MD  Potassium Chloride 40 MEQ/15ML (20%) SOLN Take 15 mLs by mouth 3 (three) times daily. 04/13/23   Shon Hale, MD  prochlorperazine (COMPAZINE) 10 MG tablet Take 1 tablet (10 mg total) by mouth every 8 (eight) hours as needed for nausea or vomiting. TAKE 1 TABLET EVERY 6 HOURS AS NEEDED FOR NAUSEA OR VOMITING Strength: 10 mg 04/13/23   Shon Hale, MD  TRUE METRIX BLOOD GLUCOSE TEST test strip TEST BLOOD SUGAR TWICE DAILY 11/10/22   Roma Kayser, MD    Physical Exam: Vitals:   06/01/23 0745 06/01/23 0835 06/01/23 0845 06/01/23 0900  BP: 93/60   (!) 86/49  Pulse: 97  94 92  Resp: (!) 31  17 19   Temp:  (!) 97.4 F (36.3 C)    TempSrc:  Oral    SpO2: 99%  99% 97%  Weight:      Height:      Gen: Frail female appearing older than stated age in no distress Pulm: Clear, nonlabored, very weak requiring assistance sitting up from supine in stretcher  CV: RRR, no MRG or pitting edema GI: Soft, NT, ND, +BS. Neuro: Alert and oriented. No new focal  deficits. Ext: Warm, no deformities. Skin: Diffuse ecchymoses and superficial skin tears on extremities, ecchymosis of left neck, intertriginous maceration involving groin and umbilicus. No exudative wounds.   Data Reviewed: Na 132, K 2.9, Mg 1.3 SCr 1.17 (BL 0.6-0.7), BUN 9, bicarb 21, glucose 113. Ca 7.1, albumin 1.9, alk phos 133, AST, ALT, bili wnl.  WBC 5.5k Hgb 10.7, macrocytic, plt 111k  CT C spine: No acute traumatic injury identified CT head: mild for age white matter changes and volume loss, no acute findings R knee XR: Severe tricompartmental OA without acute findings CXR: Hypoventilation, clear ECG: NSR w/RAD, low voltage contributing to nonspecific T wave changes/flattening diffusely, QTC . No diagnostic ST deviations.  Assessment and Plan: Generalized weakness: Due to malnutrition/dehydration with electrolyte derangements and active cancer with treatment.  - IVF, electrolyte supplementation. Monitor for infection (none currently noted).  - Will get PT/OT evaluations.   Fall at home: No traumatic MSK injuries noted on radiographic work up thus far.  - Fall precautions  Rectal CA:  - Under care of Dr. Ellin Saba, getting XRT.   AKI with NAGMA, hypovolemic hyponatremia:  - IVF - Monitor - If not improving as expected, initiate further work up  Hypoalbuminemia:  - RD consulted. If BP is low after bolus, pt consents to IV albumin.   Diarrhea: Chronic problem that is currently resolved. We can treat this with her home imodium prn, suspected cause of hypokalemia, hypomagnesemia.   Hypokalemia: Severe, symptomatic - Supplemented, recheck to guide further supplementation. Start diet  Hypomagnesemia: Supplement and monitor  Chronic macrocytic anemia and thrombocytopenia: No active bleeding.  - Given active malignancy, will give VTE ppx and monitor closely    Advance Care Planning: Confirmed DNR at admission  Consults: None  Family Communication: None at  bedside  Severity of Illness: The appropriate patient status for this patient is OBSERVATION. Observation status is judged to be reasonable and necessary in order to provide the required intensity of service to ensure the patient's safety. The patient's presenting symptoms, physical exam findings, and initial radiographic and laboratory data in the context of their medical condition is felt to place them at decreased risk for further clinical deterioration. Furthermore, it is anticipated that the patient will be  medically stable for discharge from the hospital within 2 midnights of admission.   Author: Tyrone Nine, MD 06/01/2023 9:20 AM  For on call review [email or web address].

## 2023-06-01 NOTE — Progress Notes (Signed)
Patient tolerated medication whole with no complaints. Noted patients blood pressure low throughout shift, MD aware. Patient complained of pain 10/10, new order placed. Patient given PRN for pain, see MAR. Nurse tech reported patients blood pressure 91/36 map 53, pulse 93. MD Jarvis Newcomer made aware. New order placed for 500 mL sodium chloride 0.9% bolus. Patient had not voided during shift, bladder scanned patient noted 450 in bladder. MD Jarvis Newcomer made aware. New order placed for in and out. In and out completed output 400. Patient had redness and bleeding to perineum. MD Jarvis Newcomer made aware.

## 2023-06-01 NOTE — ED Notes (Signed)
Patient transported to CT 

## 2023-06-01 NOTE — ED Triage Notes (Addendum)
Pt BIB [facility] from home, pt had a fall trying to get out of bed. Has had multiple falls in the last month.  Denies LOC, does not take blood thinners. Multiple skin tears on both arms. Endorses weakness and N&V  Pt is getting treatment for colon CA.

## 2023-06-01 NOTE — ED Notes (Signed)
Pt requested bed pan d/t feeling she needing to urinate. Pt removed from bed pan and unable to urinate.

## 2023-06-01 NOTE — ED Provider Notes (Signed)
[place] [facility] AT [facility] Provider Note   CSN: [id number] Arrival date & time: 06/01/23  4098     History  Chief Complaint  Patient presents with   [name] is a 67 y.o. female.  The history is provided by the patient.   Patient with extensive history including rectal carcinoma currently undergoing radiation therapy. Patient reports she has been feeling generalized weakness for several weeks since starting radiation therapy.  She reports recent increase in falls.  She reports over the past day she has fell 3 times.  She reports her entire body feels weak No LOC.  She did hit her head after one of the falls.  She also hit her chest on her rollator She has pain in her right knee.  She is not on anticoagulation.  She is also having vomiting without abdominal pain.   She denies any new back pain.  She does report mild neck pain since the fall.  No hip pain is reported  Home Medications Prior to Admission medications   Medication Sig Start Date End Date Taking? Authorizing Provider  blood glucose meter kit and supplies KIT Dispense based on patient and insurance preference. Use up to four times daily as directed. (FOR ICD-10 E11.65) Number of Strips 400 Number of Lancets 400 02/16/23   Roma Kayser, MD  Blood Glucose Monitoring Suppl (TRUE METRIX METER) w/Device KIT 1 each by Does not apply route 2 (two) times daily. Use to test BG bid E11.65 03/19/23   Dani Gobble, NP  diphenoxylate-atropine (LOMOTIL) 2.5-0.025 MG tablet Take 1 tablet by mouth 4 (four) times daily. Patient taking differently: Take 2 tablets by mouth 4 (four) times daily. 03/25/23   Carnella Guadalajara, PA-C  Feeding Tubes (FEEDING TUBE ATTACHMENT DEVICE) MISC 3 in 1 Bedside commode 04/27/23   [provider]  FLUOROURACIL IV Inject into the vein every 14 (fourteen) days. 01/20/23   [provider]  FLUoxetine (PROZAC) 20 MG capsule Take 20 mg by  mouth daily. 05/07/23   [provider]  HYDROcodone-acetaminophen (NORCO/VICODIN) 5-325 MG tablet TAKE (1) TABLET BY MOUTH EVERY (6) HOURS AS NEEDED. 05/07/23   Buford Dresser, Lupe Carney M, PA-C  insulin aspart (NOVOLOG FLEXPEN) 100 UNIT/ML FlexPen INJECT 0 TO 6 UNITS UNDER SKIN THREE TIMES DAILY WITH MEALS (DISCARD PEN 28 DAYS AFTER OPENING) 05/25/23   Dani Gobble, NP  insulin glargine (LANTUS SOLOSTAR) 100 UNIT/ML Solostar Pen INJECT 40 UNITS UNDER THE SKIN AT BEDTIME 05/25/23   Reardon, Freddi Starr, NP  Insulin Pen Needle (DROPLET PEN NEEDLES) 31G X 5 MM MISC USE AS INSTRUCTED TO INJECT INSULIN FOUR TIMES DAILY 05/26/23   Dani Gobble, NP  IRINOTECAN HCL IV Inject into the vein every 14 (fourteen) days. 01/20/23   [provider]  LEUCOVORIN CALCIUM IV Inject into the vein every 14 (fourteen) days. 01/20/23   [provider]  levothyroxine (SYNTHROID) 88 MCG tablet TAKE 1 TABLET EVERY DAY BEFORE BREAKFAST Patient taking differently: Take 88 mcg by mouth daily before breakfast. 04/09/23   Dani Gobble, NP  lidocaine (XYLOCAINE) 2 % solution  04/13/23   [provider]  loperamide (IMODIUM) 2 MG capsule Take 1 capsule (2 mg total) by mouth as needed for diarrhea or loose stools. TAKE 1 CAPSULE AS DIRECTED AS NEEDED FOR DIARRHEA OR LOOSE STOOLS 04/14/23   Rojelio Brenner M, PA-C  magnesium oxide (MAG-OX) 400 (240 Mg) MG tablet Take 2 tablets (  800 mg total) by mouth daily. 05/19/23   [name], MD  Misc. Devices (ROLLATOR ULTRA-LIGHT) MISC Rollator walker 04/27/23   [provider]  OLANZapine (ZYPREXA) 5 MG tablet Take 2 tablets (10 mg total) by mouth at bedtime. 04/05/23   Carnella Guadalajara, PA-C  ondansetron (ZOFRAN-ODT) 4 MG disintegrating tablet Take 1 tablet (4 mg total) by mouth every 8 (eight) hours as needed for nausea or vomiting. DISSOLVE 2 TAB ON TONGUE EVERY 8HR AS NEEDED NAUSEA, VOMITING (BETWEEN COMPAZINE DOSES FOR MAX RELIEF)  Strength: 4 mg 04/13/23   Shon Hale, MD  OXALIPLATIN IV Inject into the vein every 14 (fourteen) days. 01/20/23   [provider]  pantoprazole (PROTONIX) 40 MG tablet Take 1 tablet (40 mg total) by mouth 2 (two) times daily before a meal. 04/13/23   Emokpae, Courage, MD  Potassium Chloride 40 MEQ/15ML (20%) SOLN Take 15 mLs by mouth 3 (three) times daily. 04/13/23   Shon Hale, MD  prochlorperazine (COMPAZINE) 10 MG tablet Take 1 tablet (10 mg total) by mouth every 8 (eight) hours as needed for nausea or vomiting. TAKE 1 TABLET EVERY 6 HOURS AS NEEDED FOR NAUSEA OR VOMITING Strength: 10 mg 04/13/23   Shon Hale, MD  TRUE METRIX BLOOD GLUCOSE TEST test strip TEST BLOOD SUGAR TWICE DAILY 11/10/22   Roma Kayser, MD      Allergies    Contrast media [iodinated contrast media], Sulfa antibiotics, Doxepin, and Tape    Review of Systems   Review of Systems  Constitutional:  Negative for fever.  Gastrointestinal:  Positive for vomiting. Negative for abdominal pain.  Musculoskeletal:  Positive for arthralgias and neck pain.    Physical Exam Updated Vital Signs BP (!) 101/45   Pulse 93   Temp 98.4 F (36.9 C) (Oral)   Resp 17   Ht 1.702 m (5\' 7" )   Wt 107 kg   SpO2 98%   BMI 36.95 kg/m  Physical Exam CONSTITUTIONAL: Ill-appearing HEAD: Normocephalic/atraumatic EYES: EOMI/PERRL ENMT: Mucous membranes moist NECK: supple no meningeal signs SPINE/BACK: Mild c-spine tenderness, no thoracic or lumbar tenderness.  No bruising/crepitance/stepoffs noted to spine CV: S1/S2 noted, no murmurs/rubs/gallops noted LUNGS: Lungs are clear to auscultation bilaterally, no apparent distress Chest-mild tenderness, no bruising or crepitus ABDOMEN: soft, nontender, no rebound or guarding, bowel sounds noted throughout abdomen, no significant bruising GU:no cva tenderness NEURO: Pt is awake/alert/appropriate, patient with generalized weakness, has difficulty lifting legs due  to pain and weakness EXTREMITIES: pulses normal/equal, full ROM Bruising noted to right knee.  Pelvis appears stable, full range of motion of both hips without difficulty. Scattered abrasions throughout the upper extremities, but no focal tenderness or deformities All other extremities/joints palpated/ranged and nontender SKIN: Pale, multiple skin abrasions to her upper extremities PSYCH: no abnormalities of mood noted, alert and oriented to situation  ED Results / Procedures / Treatments   Labs (all labs ordered are listed, but only abnormal results are displayed) Labs Reviewed  COMPREHENSIVE METABOLIC PANEL - Abnormal; Notable for the following components:      Result Value   Sodium 132 (*)    Potassium 2.9 (*)    CO2 21 (*)    Glucose, Bld 113 (*)    Creatinine, Ser 1.17 (*)    Calcium 7.1 (*)    Total Protein 4.9 (*)    Albumin 1.9 (*)    Alkaline Phosphatase 133 (*)    GFR, Estimated 52 (*)    All other components within normal  limits  CBC - Abnormal; Notable for the following components:   RBC 3.41 (*)    Hemoglobin 10.7 (*)    HCT 34.3 (*)    MCV 100.6 (*)    RDW 17.4 (*)    Platelets 111 (*)    All other components within normal limits  LIPASE, BLOOD  URINALYSIS, ROUTINE W REFLEX MICROSCOPIC  MAGNESIUM    EKG EKG Interpretation Date/Time:  Tuesday June 01 2023 04:41:09 EDT Ventricular Rate:  107 PR Interval:  113 QRS Duration:  88 QT Interval:  359 QTC Calculation: 479 R Axis:   138  Text Interpretation: Sinus tachycardia Right axis deviation Low voltage, precordial leads Borderline T abnormalities, diffuse leads Confirmed by Zadie Rhine ([id number]) on 06/01/2023 5:48:58 AM  Radiology DG Chest Portable 1 View  Result Date: 06/01/2023 CLINICAL DATA:  67 year old female with history of chest pain. Multiple recent falls. EXAM: PORTABLE CHEST 1 VIEW COMPARISON:  Chest x-ray 02/21/2023. FINDINGS: Right internal jugular single-lumen Port-A-Cath with tip  terminating in the distal superior vena cava. Lung volumes are very low. Elevation of the right hemidiaphragm is noted. No acute consolidative airspace disease. No pleural effusions. No pneumothorax. No evidence of pulmonary edema. Heart size is normal. Upper mediastinal contours are within normal limits. IMPRESSION: 1. Low lung volumes without radiographic evidence of acute cardiopulmonary disease. Electronically Signed   By: Trudie Reed M.D.   On: 06/01/2023 06:54   DG Knee Right Port  Result Date: 06/01/2023 CLINICAL DATA:  68 year old female with history of right-sided knee pain after a fall. EXAM: PORTABLE RIGHT KNEE - 1-2 VIEW COMPARISON:  No priors. FINDINGS: Four views of the right knee demonstrate no definite acute displaced fracture, subluxation or dislocation. Severe joint space narrowing, subchondral sclerosis, subchondral cyst formation and osteophyte formation is noted in a tricompartmental distribution, most severe in the patellofemoral compartment. Mild chondrocalcinosis is also noted most evident in the lateral compartment. IMPRESSION: 1. No acute radiographic abnormality of the right knee. 2. 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 3. Chondrocalcinosis. Electronically Signed   By: Trudie Reed M.D.   On: 06/01/2023 06:17   CT Cervical Spine Wo Contrast  Result Date: 06/01/2023 CLINICAL DATA:  67 year old female status post fall trying to get out of bed. Skin tears. EXAM: CT CERVICAL SPINE WITHOUT CONTRAST TECHNIQUE: Multidetector CT imaging of the cervical spine was performed without intravenous contrast. Multiplanar CT image reconstructions were also generated. RADIATION DOSE REDUCTION: This exam was performed according to the departmental dose-optimization program which includes automated exposure control, adjustment of the mA and/or kV according to patient size and/or use of iterative reconstruction technique. COMPARISON:  Head CT  today. FINDINGS: Alignment: Mild straightening of cervical lordosis. Cervicothoracic junction alignment is within normal limits. Bilateral posterior element alignment is within normal limits. Skull base and vertebrae: Visualized skull base is intact. No atlanto-occipital dissociation. C1 and C2 appear intact and aligned. No acute osseous abnormality identified. Soft tissues and spinal canal: No prevertebral fluid or swelling. No visible canal hematoma. Diminutive or absent thyroid. Otherwise negative visible noncontrast neck soft tissues. Disc levels: Very mild for age cervical spine degeneration and capacious CT appearance of the cervical spinal canal. Upper chest: Right chest Port-A-Cath partially visible. Grossly intact visible upper thoracic levels. Grossly clear lung apices with motion artifact. IMPRESSION: No acute traumatic injury identified in the cervical spine. Electronically Signed   By: Odessa Fleming M.D.   On: 06/01/2023 05:59   CT Head Wo Contrast  Result Date:  06/01/2023 CLINICAL DATA:  67 year old female status post fall trying to get out of bed. Skin tears. EXAM: CT HEAD WITHOUT CONTRAST TECHNIQUE: Contiguous axial images were obtained from the base of the skull through the vertex without intravenous contrast. RADIATION DOSE REDUCTION: This exam was performed according to the departmental dose-optimization program which includes automated exposure control, adjustment of the mA and/or kV according to patient size and/or use of iterative reconstruction technique. COMPARISON:  Head CT 03/21/2013. FINDINGS: Brain: Generalized cerebral volume loss since 2014. No midline shift, ventriculomegaly, mass effect, evidence of mass lesion, intracranial hemorrhage or evidence of cortically based acute infarction. Largely normal for age gray-white differentiation. Minimal to mild scattered white matter hypodensity. Vascular: No suspicious intracranial vascular hyperdensity. Calcified atherosclerosis at the skull  base. Skull: No fracture identified. Sinuses/Orbits: Visualized paranasal sinuses and mastoids are clear. Other: No orbit or scalp soft tissue injury identified. IMPRESSION: 1. No acute intracranial abnormality or acute traumatic injury identified. 2. Mild for age white matter changes and generalized cerebral volume loss since 2014. Electronically Signed   By: Odessa Fleming M.D.   On: 06/01/2023 05:57    Procedures Procedures    Medications Ordered in ED Medications  potassium chloride 10 mEq in 100 mL IVPB (has no administration in time range)  sodium chloride 0.9 % bolus 1,000 mL (1,000 mLs Intravenous New Bag/Given 06/01/23 0613)  fentaNYL (SUBLIMAZE) injection 50 mcg (50 mcg Intravenous Given 06/01/23 0614)  ondansetron (ZOFRAN) injection 4 mg (4 mg Intravenous Given 06/01/23 1610)    ED Course/ Medical Decision Making/ A&P Clinical Course as of 06/01/23 0713  Tue Jun 01, 2023  9604 Patient ill-appearing, appears pale and weak.  She has had increasing generalized weakness for several weeks and multiple falls.  Will obtain CT and x-rays to evaluate for any traumatic injury.  Patient will need to be admitted due to intractable nausea and vomiting and dehydration [DW]  0657 Potassium(!): 2.9 Hypokalemia [DW]  0658 Creatinine(!): 1.17 Renal insufficiency [DW]  0658 Hemoglobin(!): 10.7 Mild anemia [DW]  5409 Patient with history of rectal cancer presents after a fall.  Patient has generalized weakness and multiple falls, plan to admit At this time no signs of acute traumatic injury.  All imaging is unremarkable at this time. Patient denies any new abdominal pain or back pain. [DW]  [phone] D/w Dr Jarvis Newcomer for admission Patient to be admitted for generalized weakness, dehydration in the setting of radiation therapy  [DW]    Clinical Course User Index [DW] Zadie Rhine, MD                             Medical Decision Making Amount and/or Complexity of Data Reviewed Labs: ordered. Decision-making  details documented in ED Course. Radiology: ordered.  Risk Prescription drug management. Decision regarding hospitalization.   This patient presents to the ED for concern of weakness, this involves an extensive number of treatment options, and is a complaint that carries with it a high risk of complications and morbidity.  The differential diagnosis includes but is not limited to CVA, intracranial hemorrhage, acute coronary syndrome, renal failure, urinary tract infection, electrolyte disturbance, pneumonia    Comorbidities that complicate the patient evaluation: Patient's presentation is complicated by their history of rectal cancer  Social Determinants of Health: Patient's  poor mobility   increases the complexity of managing their presentation  Additional history obtained: Additional history obtained from family Records reviewed  oncology notes  Lab  Tests: I Ordered, and personally interpreted labs.  The pertinent results include: Dehydration, hypokalemia  Imaging Studies ordered: I ordered imaging studies including CT scan head and C-spine and X-ray chest   I independently visualized and interpreted imaging which showed no acute traumatic injuries I agree with the radiologist interpretation  Cardiac Monitoring: The patient was maintained on a cardiac monitor.  I personally viewed and interpreted the cardiac monitor which showed an underlying rhythm of:  sinus tachycardia  Medicines ordered and prescription drug management: I ordered medication including IV fluids and IV fentanyl for pain and dehydration Reevaluation of the patient after these medicines showed that the patient    improved  Critical Interventions:   IV fluids  Consultations Obtained: I requested consultation with the admitting physician hospitalist , and discussed  findings as well as pertinent plan - they recommend: Admit  Reevaluation: After the interventions noted above, I reevaluated the patient and  found that they have :stayed the same  Complexity of problems addressed: Patient's presentation is most consistent with  acute presentation with potential threat to life or bodily function  Disposition: After consideration of the diagnostic results and the patient's response to treatment,  I feel that the patent would benefit from admission   .           Final Clinical Impression(s) / ED Diagnoses Final diagnoses:  Fall, initial encounter  Dehydration  Intractable nausea  Hypokalemia  Concussion without loss of consciousness, initial encounter  Strain of neck muscle, initial encounter  Blunt trauma to chest, initial encounter    Rx / DC Orders ED Discharge Orders     None         Zadie Rhine, MD 06/01/23 [phone]

## 2023-06-01 NOTE — Progress Notes (Signed)
Admitted from ED.  Stated has fallen three times recently and that uses rollator at home and gets around with help of grandson, as well.  Multiple bruises found as well as moisture associated dermititis in abd and groin fold as well as umbilicus.  Interdry applied to folds as well as powder.  Skin tears cleansed and treated with steristrips or vasoline gauze.

## 2023-06-02 ENCOUNTER — Inpatient Hospital Stay: Payer: Medicare HMO

## 2023-06-02 ENCOUNTER — Ambulatory Visit: Payer: Medicare HMO

## 2023-06-02 ENCOUNTER — Inpatient Hospital Stay: Payer: Medicare HMO | Admitting: Hematology

## 2023-06-02 DIAGNOSIS — E890 Postprocedural hypothyroidism: Secondary | ICD-10-CM | POA: Diagnosis present

## 2023-06-02 DIAGNOSIS — E861 Hypovolemia: Secondary | ICD-10-CM | POA: Diagnosis present

## 2023-06-02 DIAGNOSIS — Y92003 Bedroom of unspecified non-institutional (private) residence as the place of occurrence of the external cause: Secondary | ICD-10-CM | POA: Diagnosis not present

## 2023-06-02 DIAGNOSIS — W06XXXA Fall from bed, initial encounter: Secondary | ICD-10-CM | POA: Diagnosis present

## 2023-06-02 DIAGNOSIS — Z66 Do not resuscitate: Secondary | ICD-10-CM | POA: Diagnosis present

## 2023-06-02 DIAGNOSIS — E872 Acidosis, unspecified: Secondary | ICD-10-CM | POA: Diagnosis present

## 2023-06-02 DIAGNOSIS — I10 Essential (primary) hypertension: Secondary | ICD-10-CM | POA: Diagnosis present

## 2023-06-02 DIAGNOSIS — L8990 Pressure ulcer of unspecified site, unspecified stage: Secondary | ICD-10-CM | POA: Insufficient documentation

## 2023-06-02 DIAGNOSIS — F05 Delirium due to known physiological condition: Secondary | ICD-10-CM | POA: Diagnosis present

## 2023-06-02 DIAGNOSIS — E119 Type 2 diabetes mellitus without complications: Secondary | ICD-10-CM | POA: Diagnosis present

## 2023-06-02 DIAGNOSIS — D61818 Other pancytopenia: Secondary | ICD-10-CM | POA: Diagnosis present

## 2023-06-02 DIAGNOSIS — F3341 Major depressive disorder, recurrent, in partial remission: Secondary | ICD-10-CM | POA: Diagnosis present

## 2023-06-02 DIAGNOSIS — E876 Hypokalemia: Secondary | ICD-10-CM | POA: Diagnosis present

## 2023-06-02 DIAGNOSIS — R531 Weakness: Secondary | ICD-10-CM | POA: Diagnosis not present

## 2023-06-02 DIAGNOSIS — E43 Unspecified severe protein-calorie malnutrition: Secondary | ICD-10-CM | POA: Diagnosis present

## 2023-06-02 DIAGNOSIS — A419 Sepsis, unspecified organism: Secondary | ICD-10-CM | POA: Diagnosis present

## 2023-06-02 DIAGNOSIS — I2510 Atherosclerotic heart disease of native coronary artery without angina pectoris: Secondary | ICD-10-CM | POA: Diagnosis present

## 2023-06-02 DIAGNOSIS — E871 Hypo-osmolality and hyponatremia: Secondary | ICD-10-CM | POA: Diagnosis present

## 2023-06-02 DIAGNOSIS — L89151 Pressure ulcer of sacral region, stage 1: Secondary | ICD-10-CM | POA: Diagnosis present

## 2023-06-02 DIAGNOSIS — N390 Urinary tract infection, site not specified: Secondary | ICD-10-CM | POA: Diagnosis present

## 2023-06-02 DIAGNOSIS — N179 Acute kidney failure, unspecified: Secondary | ICD-10-CM | POA: Diagnosis present

## 2023-06-02 DIAGNOSIS — E860 Dehydration: Secondary | ICD-10-CM | POA: Diagnosis present

## 2023-06-02 DIAGNOSIS — E8809 Other disorders of plasma-protein metabolism, not elsewhere classified: Secondary | ICD-10-CM | POA: Diagnosis present

## 2023-06-02 DIAGNOSIS — C20 Malignant neoplasm of rectum: Secondary | ICD-10-CM | POA: Diagnosis present

## 2023-06-02 DIAGNOSIS — G9341 Metabolic encephalopathy: Secondary | ICD-10-CM | POA: Diagnosis present

## 2023-06-02 DIAGNOSIS — Z794 Long term (current) use of insulin: Secondary | ICD-10-CM | POA: Diagnosis not present

## 2023-06-02 LAB — GLUCOSE, CAPILLARY
Glucose-Capillary: 104 mg/dL — ABNORMAL HIGH (ref 70–99)
Glucose-Capillary: 134 mg/dL — ABNORMAL HIGH (ref 70–99)
Glucose-Capillary: 134 mg/dL — ABNORMAL HIGH (ref 70–99)
Glucose-Capillary: 132 mg/dL — ABNORMAL HIGH (ref 70–99)

## 2023-06-02 LAB — CBC
HCT: 29.8 % — ABNORMAL LOW (ref 36.0–46.0)
Hemoglobin: 9.2 g/dL — ABNORMAL LOW (ref 12.0–15.0)
MCH: 30.9 pg (ref 26.0–34.0)
MCHC: 30.9 g/dL (ref 30.0–36.0)
MCV: 100 fL (ref 80.0–100.0)
Platelets: 79 10*3/uL — ABNORMAL LOW (ref 150–400)
RBC: 2.98 MIL/uL — ABNORMAL LOW (ref 3.87–5.11)
RDW: 17.6 % — ABNORMAL HIGH (ref 11.5–15.5)
WBC: 3.7 10*3/uL — ABNORMAL LOW (ref 4.0–10.5)
nRBC: 0 % (ref 0.0–0.2)

## 2023-06-02 LAB — BASIC METABOLIC PANEL
Anion gap: 9 (ref 5–15)
BUN: 11 mg/dL (ref 8–23)
CO2: 21 mmol/L — ABNORMAL LOW (ref 22–32)
Calcium: 7 mg/dL — ABNORMAL LOW (ref 8.9–10.3)
Chloride: 99 mmol/L (ref 98–111)
Creatinine, Ser: 1.01 mg/dL — ABNORMAL HIGH (ref 0.44–1.00)
GFR, Estimated: >60 mL/min (ref >60)
Glucose, Bld: 105 mg/dL — ABNORMAL HIGH (ref 70–99)
Potassium: 3.4 mmol/L — ABNORMAL LOW (ref 3.5–5.1)
Sodium: 129 mmol/L — ABNORMAL LOW (ref 135–145)

## 2023-06-02 MED ORDER — OLANZAPINE 5 MG PO TABS
15.0000 mg | ORAL_TABLET | Freq: Every day | ORAL | Status: DC
  Administered 2023-06-02: 15 mg via ORAL
  Filled 2023-06-02: qty 3

## 2023-06-02 MED ORDER — SODIUM CHLORIDE 0.9 % IV SOLN
1.0000 g | INTRAVENOUS | Status: DC
  Administered 2023-06-02 – 2023-06-03 (×2): 1 g via INTRAVENOUS
  Filled 2023-06-02 (×2): qty 10

## 2023-06-02 MED ORDER — DIPHENHYDRAMINE HCL 50 MG/ML IJ SOLN: 25.0000 mg | Freq: Once | INTRAMUSCULAR | Status: DC

## 2023-06-02 MED ORDER — MEGESTROL ACETATE 400 MG/10ML PO SUSP
400.0000 mg | Freq: Two times a day (BID) | ORAL | Status: DC
  Administered 2023-06-02 – 2023-06-07 (×11): 400 mg via ORAL
  Filled 2023-06-02 (×11): qty 10

## 2023-06-02 NOTE — Progress Notes (Signed)
Patient has not voided since in and out cath performed on previous shift. Bladder scanned patient and measured 69ml. Patient denies any tenderness or feeling of urge to void.

## 2023-06-02 NOTE — TOC Initial Note (Signed)
Transition of Care [facility]) - Initial/Assessment Note    Patient Details  Name: [name] MRN: [id number] Date of Birth: 10-26-1956  [facility]) CM/SW Contact:    Villa Herb, LCSWA Phone Number: 06/02/2023, 11:36 AM  Clinical Narrative:                 CSW updated that PT is recommending SNF for pt at D/C. CSW spoke with pt, pt is agreeable to SNF and would like referral sent out locally. CSW to complete Fl2 and send out for review. TOC to follow.   Expected Discharge Plan: Skilled Nursing Facility Barriers to Discharge: Continued Medical Work up   Patient Goals and CMS Choice Patient states their goals for this hospitalization and ongoing recovery are:: go to SNF CMS Medicare.gov Compare Post Acute Care list provided to:: Patient Choice offered to / list presented to : Patient [place] ownership interest in [facility].provided to:: Patient    Expected Discharge Plan and Services In-house Referral: Clinical Social Work Discharge Planning Services: CM Consult Post Acute Care Choice: Skilled Nursing Facility Living arrangements for the past 2 months: Single Family Home                                      Prior Living Arrangements/Services Living arrangements for the past 2 months: Single Family Home Lives with:: Self Patient language and need for interpreter reviewed:: Yes Do you feel safe going back to the place where you live?: Yes      Need for Family Participation in Patient Care: Yes (Comment) Care giver support system in place?: Yes (comment)   Criminal Activity/Legal Involvement Pertinent to Current Situation/Hospitalization: No - Comment as needed  Activities of Daily Living Home Assistive Devices/Equipment: Dentures (specify type), Shower chair without back, Walker (specify type) ADL Screening (condition at time of admission) Patient's cognitive ability adequate to safely complete daily activities?: Yes Is the patient deaf  or have difficulty hearing?: No Does the patient have difficulty seeing, even when wearing glasses/contacts?: No Does the patient have difficulty concentrating, remembering, or making decisions?: Yes Patient able to express need for assistance with ADLs?: Yes Does the patient have difficulty dressing or bathing?: No Independently performs ADLs?: No Communication: Independent Dressing (OT): Needs assistance Is this a change from baseline?: Pre-admission baseline Grooming: Needs assistance Is this a change from baseline?: Pre-admission baseline Feeding: Independent Bathing: Needs assistance Is this a change from baseline?: Pre-admission baseline Toileting: Needs assistance Is this a change from baseline?: Pre-admission baseline In/Out Bed: Needs assistance Is this a change from baseline?: Pre-admission baseline Walks in Home: Needs assistance Is this a change from baseline?: Pre-admission baseline Does the patient have difficulty walking or climbing stairs?: Yes Weakness of Legs: Both Weakness of Arms/Hands: None  Permission Sought/Granted                  Emotional Assessment Appearance:: Appears stated age Attitude/Demeanor/Rapport: Engaged Affect (typically observed): Accepting Orientation: : Oriented to Self, Oriented to Place, Oriented to  Time, Oriented to Situation Alcohol / Substance Use: Not Applicable Psych Involvement: No (comment)  Admission diagnosis:  Dehydration [E86.0] Hypokalemia [E87.6] Weakness [R53.1] Concussion without loss of consciousness, initial encounter [S06.0X0A] Strain of neck muscle, initial encounter [S16.1XXA] Fall, initial encounter [id number]] Blunt trauma to chest, initial encounter [S29.8XXA] Intractable nausea [R11.0] Patient Active Problem List   Diagnosis Date Noted   Weakness  06/01/2023   Emesis, persistent 04/13/2023   Rectal bleeding 01/27/2023   Acute blood loss anemia 01/27/2023   Pancytopenia (HCC) 01/27/2023   Rectal  cancer (HCC) 01/03/2023   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   Vomiting and diarrhea 09/11/2018   Hypokalemia 09/11/2018   Hyponatremia 09/11/2018   Gastroenteritis 09/11/2018   Chest pain 08/17/2018   Mild aortic stenosis 08/17/2018   Iron deficiency anemia 10/05/2017   Recurrent major depressive disorder, in partial remission (HCC) 10/06/2016   Mixed hyperlipidemia 02/01/2016   Hyperthyroidism 02/01/2016   Arthritis 02/01/2016   GERD (gastroesophageal reflux disease) 02/01/2016   Hypertension 02/01/2016   Uncontrolled type 2 diabetes mellitus with peripheral neuropathy 02/01/2016   Vitamin D deficiency 02/01/2016   Palpitations 04/03/2011   Morbid obesity (HCC) 04/03/2011   Essential hypertension, benign 04/03/2011   Type 2 diabetes mellitus with complication, with long-term current use of insulin (HCC) 04/03/2011   PCP:  Gwenlyn Found, MD Pharmacy:   [facility] - [place], [place] - [address] [place] [address] Phone: [phone] Fax: [phone]  [facility] - [place], [place] - [address] [place] [address] Phone: [phone] Fax: [phone]  [facility] (New Address) - [place], [place] - [address] [facility] AT Previously: [address], [place] [address] [facility] Floor [address] [place] [address] Phone: [phone] Fax: [phone]  [place] [facility] [address] [place] [address] Phone: [phone] Fax: [phone]     Social Determinants of Health (SDOH) Social History: SDOH Screenings   Food Insecurity: No Food Insecurity (06/01/2023)  Housing: Low Risk  (06/01/2023)  Transportation Needs: No Transportation Needs (06/01/2023)  Utilities: Not At Risk (06/01/2023)  Depression (PHQ2-9): Low Risk  (01/06/2023)  Tobacco Use: Low Risk   (06/01/2023)   SDOH Interventions:     Readmission Risk Interventions     No data to display

## 2023-06-02 NOTE — [place] FL2 (Addendum)
[place] MEDICAID FL2 LEVEL OF CARE FORM     IDENTIFICATION  Patient Name: [name] Birthdate: 19-Mar-1956 Sex: female Admission Date (Current Location): 06/01/2023  [facility] and [place] Number:  [employer] and Address:  [facility],  [address] [place] [address]      Provider Number: [id number]  Attending Physician Name and Address:  Azucena Fallen, MD  Relative Name and Phone Number:       Current Level of Care: Hospital Recommended Level of Care: Skilled Nursing Facility Prior Approval Number:    Date Approved/Denied:   PASRR Number: [id number]  Discharge Plan: SNF    Current Diagnoses: Patient Active Problem List   Diagnosis Date Noted   Weakness 06/01/2023   Emesis, persistent 04/13/2023   Rectal bleeding 01/27/2023   Acute blood loss anemia 01/27/2023   Pancytopenia (HCC) 01/27/2023   Rectal cancer (HCC) 01/03/2023   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   Vomiting and diarrhea 09/11/2018   Hypokalemia 09/11/2018   Hyponatremia 09/11/2018   Gastroenteritis 09/11/2018   Chest pain 08/17/2018   Mild aortic stenosis 08/17/2018   Iron deficiency anemia 10/05/2017   Recurrent major depressive disorder, in partial remission (HCC) 10/06/2016   Mixed hyperlipidemia 02/01/2016   Hyperthyroidism 02/01/2016   Arthritis 02/01/2016   GERD (gastroesophageal reflux disease) 02/01/2016   Hypertension 02/01/2016   Uncontrolled type 2 diabetes mellitus with peripheral neuropathy 02/01/2016   Vitamin D deficiency 02/01/2016   Palpitations 04/03/2011   Morbid obesity (HCC) 04/03/2011   Essential hypertension, benign 04/03/2011   Type 2 diabetes mellitus with complication, with long-term current use of insulin (HCC) 04/03/2011    Orientation RESPIRATION BLADDER Height & Weight     Self, Time, Situation, Place  Normal Continent Weight: 250 lb 3.6 oz (113.5 kg) Height:  5\' 7"   (170.2 cm)  BEHAVIORAL SYMPTOMS/MOOD NEUROLOGICAL BOWEL NUTRITION STATUS      Continent Diet (See D/C summary)  AMBULATORY STATUS COMMUNICATION OF NEEDS Skin   Extensive Assist Verbally Normal  Coccyx stage 2                       Personal Care Assistance Level of Assistance  Bathing, Feeding, Dressing Bathing Assistance: Limited assistance Feeding assistance: Independent Dressing Assistance: Limited assistance     Functional Limitations Info  Sight, Hearing, Speech Sight Info: Adequate Hearing Info: Adequate Speech Info: Adequate    SPECIAL CARE FACTORS FREQUENCY  PT (By licensed PT), OT (By licensed OT)     PT Frequency: 5 times weekly OT Frequency: 5 times weekly            Contractures Contractures Info: Not present    Additional Factors Info  Code Status, Allergies Code Status Info: DNR Allergies Info: Contrast Media (Iodinated Contrast Media), Sulfa Antibiotics, Doxepin, Tape           Current Medications (06/02/2023):  This is the current hospital active medication list Current Facility-Administered Medications  Medication Dose Route Frequency Provider Last Rate Last Admin   acetaminophen (TYLENOL) tablet 650 mg  650 mg Oral Q6H PRN Tyrone Nine, MD   650 mg at 06/02/23 0725   Or   acetaminophen (TYLENOL) suppository 650 mg  650 mg Rectal Q6H PRN Tyrone Nine, MD       cefTRIAXone (ROCEPHIN) 1 g in sodium chloride 0.9 % 100 mL IVPB  1 g Intravenous Q24H Azucena Fallen, MD  enoxaparin (LOVENOX) injection 55 mg  55 mg Subcutaneous Q24H Hazeline Junker B, MD   55 mg at 06/01/23 2118   insulin aspart (novoLOG) injection 0-15 Units  0-15 Units Subcutaneous TID WC Tyrone Nine, MD       insulin aspart (novoLOG) injection 0-5 Units  0-5 Units Subcutaneous QHS Hazeline Junker B, MD       lactated ringers 1,000 mL with potassium chloride 20 mEq infusion   Intravenous Continuous Tyrone Nine, MD 100 mL/hr at 06/02/23 0823 New Bag at 06/02/23 0823    levothyroxine (SYNTHROID) tablet 88 mcg  88 mcg Oral Q0600 Tyrone Nine, MD   88 mcg at 06/02/23 0456   magnesium oxide (MAG-OX) tablet 800 mg  800 mg Oral Daily Hazeline Junker B, MD   800 mg at 06/02/23 0932   megestrol (MEGACE) 400 MG/10ML suspension 400 mg  400 mg Oral BID Azucena Fallen, MD       morphine (PF) 2 MG/ML injection 2-4 mg  2-4 mg Intravenous Q3H PRN Tyrone Nine, MD   2 mg at 06/01/23 1602   nystatin (MYCOSTATIN/NYSTOP) topical powder   Topical TID Tyrone Nine, MD   Given at 06/02/23 0932   OLANZapine (ZYPREXA) tablet 10 mg  10 mg Oral QHS Hazeline Junker B, MD   10 mg at 06/01/23 2118   ondansetron (ZOFRAN) injection 4 mg  4 mg Intravenous Q6H PRN Tyrone Nine, MD       pantoprazole (PROTONIX) EC tablet 40 mg  40 mg Oral BID AC Hazeline Junker B, MD   40 mg at 06/02/23 0725   prochlorperazine (COMPAZINE) injection 10 mg  10 mg Intravenous Q6H PRN Tyrone Nine, MD   10 mg at 06/02/23 1191   sodium chloride flush (NS) 0.9 % injection 3 mL  3 mL Intravenous Q12H Tyrone Nine, MD   3 mL at 06/02/23 4782   Facility-Administered Medications Ordered in Other Encounters  Medication Dose Route Frequency Provider Last Rate Last Admin   0.9 %  sodium chloride infusion   Intravenous Continuous [name], MD   Stopped at 02/17/23 1535     Discharge Medications: Please see discharge summary for a list of discharge medications.  Relevant Imaging Results:  Relevant Lab Results:   Additional Information SSN: [id number], [place]

## 2023-06-02 NOTE — Evaluation (Signed)
Physical Therapy Evaluation Patient Details Name: [name] MRN: [id number] DOB: 1956-08-29 Today's Date: 06/02/2023  History of Present Illness  [name] is a 67 y.o. female with a history of rectal CA s/p chemo undergoing XRT, HTN, T2DM, HERD, hypothyroidism, anemia and thrombocytopenia who presented from home by EMS after a fall at home. She has been feeling gradually more weak diffusely for several weeks since starting radiation therapy (next due today) and having diarrhea regularly, not taking adequate oral intake over past week, and often has vomiting. She came in after a trip and fall at home today despite using her rolling walker. She feels "terrible." Denies fever, chills, HA, chest pain, cough, dyspnea, abd pain, dysuria, frequent or urgent urination, overt GI bleeding. She bruises very easily, not on blood thinner, and has had scrapes from falls but denies any specific pain at this time. She was afebrile with normal WBC, blood counts at their baselines. SCr 1.17 with K 2.9 indicating AKI. Radiographic work up has been negative for acute fracture or dislocations. IVF, zofran, K supplementation was given and admission requested.   Clinical Impression  Patient demonstrates slow labored movement for sitting up at bedside, once seated frequent leaning forward, resting on elbows due to fatigue, limited to a few side steps before requesting to sit due to fatigue/weakness and tolerated sitting up in chair with family members, MD present after therapy.  Patient will benefit from continued skilled physical therapy in hospital and recommended venue below to increase strength, balance, endurance for safe ADLs and gait.          Assistance Recommended at Discharge Set up Supervision/Assistance  If plan is discharge home, recommend the following:  Can travel by private vehicle  A lot of help with bathing/dressing/bathroom;A lot of help with walking and/or transfers;Assistance with  cooking/housework;Help with stairs or ramp for entrance   Yes    Equipment Recommendations None recommended by PT  Recommendations for Other Services       Functional Status Assessment Patient has had a recent decline in their functional status and demonstrates the ability to make significant improvements in function in a reasonable and predictable amount of time.     Precautions / Restrictions Precautions Precautions: Fall Restrictions Weight Bearing Restrictions: No      Mobility  Bed Mobility Overal bed mobility: Needs Assistance Bed Mobility: Supine to Sit     Supine to sit: Min assist, Mod assist          Transfers Overall transfer level: Needs assistance Equipment used: Rolling walker (2 wheels) Transfers: Sit to/from Stand, Bed to chair/wheelchair/BSC Sit to Stand: Min assist, Mod assist   Step pivot transfers: Mod assist       General transfer comment: slow labored movement    Ambulation/Gait Ambulation/Gait assistance: Mod assist Gait Distance (Feet): 4 Feet Assistive device: Rolling walker (2 wheels) Gait Pattern/deviations: Decreased step length - right, Decreased step length - left, Decreased stride length Gait velocity: slow     General Gait Details: limited to a few side steps before having to sit due to c/o fatigue and weakness  Stairs            Wheelchair Mobility     Tilt Bed    Modified Rankin (Stroke Patients Only)       Balance Overall balance assessment: Needs assistance Sitting-balance support: Feet supported, No upper extremity supported Sitting balance-Leahy Scale: Fair Sitting balance - Comments: fair/good seated at EOB   Standing balance support: During  functional activity, Bilateral upper extremity supported, Reliant on assistive device for balance Standing balance-Leahy Scale: Poor Standing balance comment: using RW                             Pertinent Vitals/Pain Pain Assessment Pain  Assessment: No/denies pain    Home Living Family/patient expects to be discharged to:: Private residence Living Arrangements: Children Available Help at Discharge: Family;Available PRN/intermittently Type of Home: House Home Access: Ramped entrance     Alternate Level Stairs-Number of Steps: Patient does not go upstairs Home Layout: Two level Home Equipment: Rollator (4 wheels);BSC/3in1      Prior Function Prior Level of Function : Needs assist       Physical Assist : Mobility (physical);ADLs (physical) Mobility (physical): Bed mobility;Transfers;Gait;Stairs   Mobility Comments: household ambulator using Rollator ADLs Comments: Assisted by [employer]     Hand Dominance        Extremity/Trunk Assessment   Upper Extremity Assessment Upper Extremity Assessment: Generalized weakness    Lower Extremity Assessment Lower Extremity Assessment: Generalized weakness    Cervical / Trunk Assessment Cervical / Trunk Assessment: Normal  Communication   Communication: No difficulties  Cognition Arousal/Alertness: Awake/alert Behavior During Therapy: WFL for tasks assessed/performed Overall Cognitive Status: Within Functional Limits for tasks assessed                                          General Comments      Exercises     Assessment/Plan    PT Assessment Patient needs continued PT services  PT Problem List Decreased strength;Decreased activity tolerance;Decreased balance;Decreased mobility       PT Treatment Interventions DME instruction;Gait training;Stair training;Functional mobility training;Therapeutic activities;Therapeutic exercise;Patient/family education;Balance training    PT Goals (Current goals can be found in the Care Plan section)  Acute Rehab PT Goals Patient Stated Goal: return  home after rehab PT Goal Formulation: With patient/family Time For Goal Achievement: 06/16/23 Potential to Achieve Goals: Good    Frequency Min  3X/week     Co-evaluation               AM-PAC PT "6 Clicks" Mobility  Outcome Measure Help needed turning from your back to your side while in a flat bed without using bedrails?: A Little Help needed moving from lying on your back to sitting on the side of a flat bed without using bedrails?: A Lot Help needed moving to and from a bed to a chair (including a wheelchair)?: A Lot Help needed standing up from a chair using your arms (e.g., wheelchair or bedside chair)?: A Lot Help needed to walk in hospital room?: A Lot Help needed climbing 3-5 steps with a railing? : A Lot 6 Click Score: 13    End of Session   Activity Tolerance: Patient tolerated treatment well;Patient limited by fatigue Patient left: in chair;with family/visitor present Nurse Communication: Mobility status PT Visit Diagnosis: Unsteadiness on feet (R26.81);Other abnormalities of gait and mobility (R26.89);Muscle weakness (generalized) (M62.81)    Time: 1610-9604 PT Time Calculation (min) (ACUTE ONLY): 14 min   Charges:   PT Evaluation $PT Eval Low Complexity: 1 Low PT Treatments $Therapeutic Activity: 8-22 mins PT General Charges $$ ACUTE PT VISIT: 1 Visit         3:22 PM, 06/02/23 Ocie Bob, MPT Physical Therapist with [facility]  [facility] [phone] office [phone] mobile phone

## 2023-06-02 NOTE — Plan of Care (Signed)
  Problem: Acute Rehab PT Goals(only PT should resolve) Goal: Pt Will Go Supine/Side To Sit Outcome: Progressing Flowsheets (Taken 06/02/2023 1523) Pt will go Supine/Side to Sit:  with min guard assist  with supervision Goal: Patient Will Transfer Sit To/From Stand Outcome: Progressing Flowsheets (Taken 06/02/2023 1523) Patient will transfer sit to/from stand:  with supervision  with min guard assist Goal: Pt Will Transfer Bed To Chair/Chair To Bed Outcome: Progressing Flowsheets (Taken 06/02/2023 1523) Pt will Transfer Bed to Chair/Chair to Bed:  min guard assist  with min assist Goal: Pt Will Ambulate Outcome: Progressing Flowsheets (Taken 06/02/2023 1523) Pt will Ambulate:  25 feet  with minimal assist  with rolling walker   3:23 PM, 06/02/23 Ocie Bob, MPT Physical Therapist with [facility] [phone] office [phone] mobile phone

## 2023-06-02 NOTE — Progress Notes (Signed)
PROGRESS NOTE    LAVAYA DEFREITAS  ZOX:[id number] DOB: [birth year] DOA: 06/01/2023 PCP: Gwenlyn Found, MD   Brief Narrative:  [name] is a 67 y.o. female with a history of rectal CA s/p chemo undergoing XRT, HTN, T2DM, HERD, hypothyroidism, anemia and thrombocytopenia who presented from home by EMS after a fall at home.  She reports worsening symptoms of weakness ambulatory dysfunction and diarrhea with poor p.o. intake since starting radiation several weeks ago.  Hospitalist called for admission in the setting of failure to thrive secondary to current treatment with profound ambulatory dysfunction. Assessment & Plan:   Principal Problem:   Weakness Active Problems:   Pancytopenia (HCC)   Emesis, persistent   Rectal cancer (HCC)   Morbid obesity (HCC)   Essential hypertension, benign   Type 2 diabetes mellitus with complication, with long-term current use of insulin (HCC)   GERD (gastroesophageal reflux disease)   Hypertension   Recurrent major depressive disorder, in partial remission (HCC)   Hypokalemia   Hypothyroidism following radioiodine therapy  Sepsis secondary to UTI, POA -Notable leukopenia, tachycardia, febrile with source likely UTI given abnormal UA and patient's urinary symptoms of increased frequency lately -Continue ceftriaxone x 3 days minimally follow cultures, escalate as appropriate -Patient and family indicate she typically has episodes of weakness and mental status changes with UTI which are consistent with this event.  Ambulatory dysfunction, multiple falls Likely exacerbated in the setting of acute UTI however prolonged symptoms likely secondary to recent initiation of radiation treatment and ongoing diarrhea.  No overt injuries, imaging negative thus far  Hallucinations/altered mental status -Likely secondary to above sepsis/UTI given history -Follow-up for sundowning/delirium and polypharmacy given her history -His events appear to be nightly per  family at bedside   Rectal CA:  - Under care of Dr. Ellin Saba, getting XRT.    AKI with NAGMA, hypovolemic hyponatremia:  - IVF ongoing -Decrease fluids once p.o. intake is appropriate   Hypoalbuminemia Complicated by poor p.o. intake secondary to treatment and infection as above - RD consulted -recommend more liberalized diet given above   Diarrhea, acute on chronic:  -Continue home Imodium, likely exacerbating electrolyte abnormalities dehydration and symptoms as above   Hypokalemia: Severe, symptomatic Hypomagnesemia, POA - Supplemented, recheck to guide further supplementation. Start diet   Chronic macrocytic anemia and thrombocytopenia: - Given active malignancy, continue DVT prophylaxis -Currently stable near baseline, no signs or symptoms of bleeding  Multiple wounds/skin tears/pressure injuries -See skin evaluation per nursing (coccyx, arms of note)  DVT prophylaxis: lovenox Code Status:   Code Status: DNR Family Communication: Daughter and grandson at bedside  Status is: Inpatient  Dispo: The patient is from: Home              Anticipated d/c is to: SNF              Anticipated d/c date is: 24 to 48 hours              Patient currently not medically stable for discharge  Consultants:  Oncology Dr. Ellin Saba  Procedures:  None  Antimicrobials:  Ceftriaxone x 3 days  Subjective: No acute issues or events overnight denies nausea vomiting constipation headache fevers chills or chest pain.  Objective: Vitals:   06/01/23 1931 06/01/23 2105 06/02/23 0130 06/02/23 0457  BP: (!) 95/54 94/61 95/60  (!) 95/59  Pulse: 95 97 99 90  Resp:  20 20 20   Temp:  (!) 100.4 F (38 C) 98.4 F (36.9 C) 97.8  F (36.6 C)  TempSrc:   Oral Oral  SpO2:  97% 94% 99%  Weight:      Height:        Intake/Output Summary (Last 24 hours) at 06/02/2023 0733 Last data filed at 06/02/2023 0641 Gross per 24 hour  Intake 1725.84 ml  Output --  Net 1725.84 ml   Filed Weights    06/01/23 0431 06/01/23 1241  Weight: 107 kg 113.5 kg    Examination:  General: No acute distress, sitting in bedside chair appears fatigued and somewhat exhausted after recent PT eval HEENT:  Normocephalic atraumatic.  Sclerae nonicteric, noninjected.  Extraocular movements intact bilaterally. Neck:  Without mass or deformity.  Trachea is midline. Lungs: Bibasilar rales. Heart:  Regular rate and rhythm.  Without murmurs, rubs, or gallops. Abdomen:  Soft, nontender, obese nondistended.  Without guarding or rebound. Extremities: Without cyanosis, clubbing Skin:  Warm and dry, no erythema.  Multiple noted skin excoriations, tears and pressure injuries over the bilateral upper extremities and coccyx/sacrum.   Data Reviewed: I have personally reviewed following labs and imaging studies  CBC: Recent Labs  Lab 05/26/23 0841 06/01/23 0433 06/02/23 0443  WBC 4.8 5.5 3.7*  NEUTROABS 3.5  --   --   HGB 11.5* 10.7* 9.2*  HCT 36.8 34.3* 29.8*  MCV 100.5* 100.6* 100.0  PLT 118* 111* 79*   Basic Metabolic Panel: Recent Labs  Lab 05/26/23 0841 06/01/23 0433 06/01/23 1056 06/02/23 0443  NA 135 132* 131* 129*  K 2.6* 2.9* 3.5 3.4*  CL 103 99 101 99  CO2 22 21* 22 21*  GLUCOSE 129* 113* 126* 105*  BUN 7* 9 9 11   CREATININE 0.74 1.17* 1.04* 1.01*  CALCIUM 7.7* 7.1* 6.8* 7.0*  MG 1.4* 1.3*  --   --    GFR: Estimated Creatinine Clearance: 72.2 mL/min (A) (by C-G formula based on SCr of 1.01 mg/dL (H)). Liver Function Tests: Recent Labs  Lab 05/26/23 0841 06/01/23 0433  AST 32 33  ALT 14 13  ALKPHOS 131* 133*  BILITOT 0.9 1.1  PROT 5.5* 4.9*  ALBUMIN 2.3* 1.9*   Recent Labs  Lab 06/01/23 0433  LIPASE 26   CBG: Recent Labs  Lab 06/01/23 1501 06/01/23 1646 06/01/23 2107  GLUCAP 131* 114* 113*   No results found for this or any previous visit (from the past 240 hour(s)).   Radiology Studies: DG Chest Portable 1 View  Result Date: 06/01/2023 CLINICAL DATA:   67 year old female with history of chest pain. Multiple recent falls. EXAM: PORTABLE CHEST 1 VIEW COMPARISON:  Chest x-ray 02/21/2023. FINDINGS: Right internal jugular single-lumen Port-A-Cath with tip terminating in the distal superior vena cava. Lung volumes are very low. Elevation of the right hemidiaphragm is noted. No acute consolidative airspace disease. No pleural effusions. No pneumothorax. No evidence of pulmonary edema. Heart size is normal. Upper mediastinal contours are within normal limits. IMPRESSION: 1. Low lung volumes without radiographic evidence of acute cardiopulmonary disease. Electronically Signed   By: Trudie Reed M.D.   On: 06/01/2023 06:54   DG Knee Right Port  Result Date: 06/01/2023 CLINICAL DATA:  67 year old female with history of right-sided knee pain after a fall. EXAM: PORTABLE RIGHT KNEE - 1-2 VIEW COMPARISON:  No priors. FINDINGS: Four views of the right knee demonstrate no definite acute displaced fracture, subluxation or dislocation. Severe joint space narrowing, subchondral sclerosis, subchondral cyst formation and osteophyte formation is noted in a tricompartmental distribution, most severe in the patellofemoral compartment. Mild chondrocalcinosis  is also noted most evident in the lateral compartment. IMPRESSION: 1. No acute radiographic abnormality of the right knee. 2. 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 3. Chondrocalcinosis. Electronically Signed   By: Trudie Reed M.D.   On: 06/01/2023 06:17   CT Cervical Spine Wo Contrast  Result Date: 06/01/2023 CLINICAL DATA:  67 year old female status post fall trying to get out of bed. Skin tears. EXAM: CT CERVICAL SPINE WITHOUT CONTRAST TECHNIQUE: Multidetector CT imaging of the cervical spine was performed without intravenous contrast. Multiplanar CT image reconstructions were also generated. RADIATION DOSE REDUCTION: This exam was performed according to the  departmental dose-optimization program which includes automated exposure control, adjustment of the mA and/or kV according to patient size and/or use of iterative reconstruction technique. COMPARISON:  Head CT today. FINDINGS: Alignment: Mild straightening of cervical lordosis. Cervicothoracic junction alignment is within normal limits. Bilateral posterior element alignment is within normal limits. Skull base and vertebrae: Visualized skull base is intact. No atlanto-occipital dissociation. C1 and C2 appear intact and aligned. No acute osseous abnormality identified. Soft tissues and spinal canal: No prevertebral fluid or swelling. No visible canal hematoma. Diminutive or absent thyroid. Otherwise negative visible noncontrast neck soft tissues. Disc levels: Very mild for age cervical spine degeneration and capacious CT appearance of the cervical spinal canal. Upper chest: Right chest Port-A-Cath partially visible. Grossly intact visible upper thoracic levels. Grossly clear lung apices with motion artifact. IMPRESSION: No acute traumatic injury identified in the cervical spine. Electronically Signed   By: Odessa Fleming M.D.   On: 06/01/2023 05:59   CT Head Wo Contrast  Result Date: 06/01/2023 CLINICAL DATA:  67 year old female status post fall trying to get out of bed. Skin tears. EXAM: CT HEAD WITHOUT CONTRAST TECHNIQUE: Contiguous axial images were obtained from the base of the skull through the vertex without intravenous contrast. RADIATION DOSE REDUCTION: This exam was performed according to the departmental dose-optimization program which includes automated exposure control, adjustment of the mA and/or kV according to patient size and/or use of iterative reconstruction technique. COMPARISON:  Head CT 03/21/2013. FINDINGS: Brain: Generalized cerebral volume loss since 2014. No midline shift, ventriculomegaly, mass effect, evidence of mass lesion, intracranial hemorrhage or evidence of cortically based acute  infarction. Largely normal for age gray-white differentiation. Minimal to mild scattered white matter hypodensity. Vascular: No suspicious intracranial vascular hyperdensity. Calcified atherosclerosis at the skull base. Skull: No fracture identified. Sinuses/Orbits: Visualized paranasal sinuses and mastoids are clear. Other: No orbit or scalp soft tissue injury identified. IMPRESSION: 1. No acute intracranial abnormality or acute traumatic injury identified. 2. Mild for age white matter changes and generalized cerebral volume loss since 2014. Electronically Signed   By: Odessa Fleming M.D.   On: 06/01/2023 05:57        Scheduled Meds:  enoxaparin (LOVENOX) injection  55 mg Subcutaneous Q24H   insulin aspart  0-15 Units Subcutaneous TID WC   insulin aspart  0-5 Units Subcutaneous QHS   levothyroxine  88 mcg Oral Q0600   magnesium oxide  800 mg Oral Daily   nystatin   Topical TID   OLANZapine  10 mg Oral QHS   pantoprazole  40 mg Oral BID AC   sodium chloride flush  3 mL Intravenous Q12H   Continuous Infusions:  lactated ringers 1,000 mL with potassium chloride 20 mEq infusion 100 mL/hr at 06/02/23 0009     LOS: 0 days    Time spent:    Azucena Fallen, DO  [facility]  If 7PM-7AM, please contact night-coverage [email or web address]  06/02/2023, 7:33 AM

## 2023-06-02 NOTE — Progress Notes (Signed)
Patient grandson came to desk and got this Clinical research associate and stated that patient was hallucinating. This Clinical research associate and Juanita Laster, RN went and assessed patient. Patient is alert and oriented x 3. She is not able to tell me the month she says junior instead of June or July. VS WNL. Notified Dr. Natale Milch. See new orders. Educated patient and grandson that the confusion / hallucination could be from delirium / uti. Reoriented patient.     06/02/23 1847  Vitals  Temp 97.9 F (36.6 C)  Temp Source Oral  BP 117/60  MAP (mmHg) 77  BP Location Left Arm  BP Method Automatic  Patient Position (if appropriate) Lying  Pulse Rate (!) 110  Pulse Rate Source Dinamap  Resp 20  MEWS COLOR  MEWS Score Color Green  Oxygen Therapy  SpO2 99 %  O2 Device Room Air  MEWS Score  MEWS Temp 0  MEWS Systolic 0  MEWS Pulse 1  MEWS RR 0  MEWS LOC 0  MEWS Score 1

## 2023-06-03 ENCOUNTER — Ambulatory Visit: Payer: Medicare HMO

## 2023-06-03 DIAGNOSIS — R531 Weakness: Secondary | ICD-10-CM | POA: Diagnosis not present

## 2023-06-03 LAB — COMPREHENSIVE METABOLIC PANEL
ALT: 15 U/L (ref 0–44)
AST: 30 U/L (ref 15–41)
Albumin: 1.8 g/dL — ABNORMAL LOW (ref 3.5–5.0)
Alkaline Phosphatase: 130 U/L — ABNORMAL HIGH (ref 38–126)
Anion gap: 6 (ref 5–15)
BUN: 13 mg/dL (ref 8–23)
CO2: 23 mmol/L (ref 22–32)
Calcium: 7.2 mg/dL — ABNORMAL LOW (ref 8.9–10.3)
Chloride: 99 mmol/L (ref 98–111)
Creatinine, Ser: 1.01 mg/dL — ABNORMAL HIGH (ref 0.44–1.00)
GFR, Estimated: >60 mL/min (ref >60)
Glucose, Bld: 131 mg/dL — ABNORMAL HIGH (ref 70–99)
Potassium: 3.7 mmol/L (ref 3.5–5.1)
Sodium: 128 mmol/L — ABNORMAL LOW (ref 135–145)
Total Bilirubin: 1.3 mg/dL — ABNORMAL HIGH (ref 0.3–1.2)
Total Protein: 4.9 g/dL — ABNORMAL LOW (ref 6.5–8.1)

## 2023-06-03 LAB — CBC
HCT: 30.3 % — ABNORMAL LOW (ref 36.0–46.0)
Hemoglobin: 9.5 g/dL — ABNORMAL LOW (ref 12.0–15.0)
MCH: 30.9 pg (ref 26.0–34.0)
MCHC: 31.4 g/dL (ref 30.0–36.0)
MCV: 98.7 fL (ref 80.0–100.0)
Platelets: 60 10*3/uL — ABNORMAL LOW (ref 150–400)
RBC: 3.07 MIL/uL — ABNORMAL LOW (ref 3.87–5.11)
RDW: 17.4 % — ABNORMAL HIGH (ref 11.5–15.5)
WBC: 4 10*3/uL (ref 4.0–10.5)
nRBC: 0 % (ref 0.0–0.2)

## 2023-06-03 LAB — GLUCOSE, CAPILLARY
Glucose-Capillary: 116 mg/dL — ABNORMAL HIGH (ref 70–99)
Glucose-Capillary: 137 mg/dL — ABNORMAL HIGH (ref 70–99)
Glucose-Capillary: 170 mg/dL — ABNORMAL HIGH (ref 70–99)
Glucose-Capillary: 139 mg/dL — ABNORMAL HIGH (ref 70–99)

## 2023-06-03 MED ORDER — QUETIAPINE FUMARATE 100 MG PO TABS
100.0000 mg | ORAL_TABLET | Freq: Every day | ORAL | Status: DC
  Administered 2023-06-03 – 2023-06-06 (×4): 100 mg via ORAL
  Filled 2023-06-03 (×4): qty 1

## 2023-06-03 NOTE — TOC Progression Note (Signed)
Transition of Care [facility]) - Progression Note    Patient Details  Name: [name] MRN: [id number] Date of Birth: 07/31/1956  [facility]) CM/SW Contact  Villa Herb, [place] Phone Number: 06/03/2023, 10:55 AM  Clinical Narrative:    CSW updated at 6:30am that pts daughter would like to speak with TOC, CSW unable to reach daughter and left VM for return call. CSW met with pt at bedside to review bed offer from [facility]. CSW explained that referral can be sent to facilities outside of this county if she would like. Pt states she would like to accept bed offer at [facility]. CSW explained that insurance auth would be started for SNF. TOC to follow.   Expected Discharge Plan: Skilled Nursing Facility Barriers to Discharge: Continued Medical Work up  Expected Discharge Plan and Services In-house Referral: Clinical Social Work Discharge Planning Services: CM Consult Post Acute Care Choice: Skilled Nursing Facility Living arrangements for the past 2 months: Single Family Home                                       Social Determinants of Health (SDOH) Interventions SDOH Screenings   Food Insecurity: No Food Insecurity (06/01/2023)  Housing: Low Risk  (06/01/2023)  Transportation Needs: No Transportation Needs (06/01/2023)  Utilities: Not At Risk (06/01/2023)  Depression (PHQ2-9): Low Risk  (01/06/2023)  Tobacco Use: Low Risk  (06/01/2023)    Readmission Risk Interventions     No data to display

## 2023-06-03 NOTE — Progress Notes (Signed)
Secure chat sent to Villa Herb, social worker to call patient daughter April Winchester to discuss plans at discharge and possible resources available.

## 2023-06-03 NOTE — Progress Notes (Signed)
   06/03/23 0429  Urine Characteristics  Urine Color Amber  Urinary Interventions Bladder scan;Intermittent/Straight cath  Bladder Scan Volume (mL) 419 mL  Intermittent/Straight Cath (mL) 580 mL  Intermittent Catheter Size 16   Pt prefers to have a foley catheter as she is retaining urine and is still having to be straight cath each time she feels fullness in her bladder. Her perineum/groin has a severe rash/ sensitivity and would greatly benefit from a foley versus continuous in & out caths in addition to retaining urine.

## 2023-06-03 NOTE — Progress Notes (Signed)
Pt states she feels like she needs to void and her belly is full. This nurse did a bladder scan and it showed 275 cc. D/t patient having multiple I/O. MD stated to put in a foley.

## 2023-06-03 NOTE — Evaluation (Signed)
Occupational Therapy Evaluation Patient Details Name: [name] MRN: [id number] DOB: 1956-04-28 Today's Date: 06/03/2023   History of Present Illness [name] is a 67 y.o. female with a history of rectal CA s/p chemo undergoing XRT, HTN, T2DM, HERD, hypothyroidism, anemia and thrombocytopenia who presented from home by EMS after a fall at home. She has been feeling gradually more weak diffusely for several weeks since starting radiation therapy (next due today) and having diarrhea regularly, not taking adequate oral intake over past week, and often has vomiting. She came in after a trip and fall at home today despite using her rolling walker. She feels "terrible." Denies fever, chills, HA, chest pain, cough, dyspnea, abd pain, dysuria, frequent or urgent urination, overt GI bleeding. She bruises very easily, not on blood thinner, and has had scrapes from falls but denies any specific pain at this time. She was afebrile with normal WBC, blood counts at their baselines. SCr 1.17 with K 2.9 indicating AKI. Radiographic work up has been negative for acute fracture or dislocations. IVF, zofran, K supplementation was given and admission requested.   Clinical Impression   Pt agreeable to OT evaluation. Pt is assisted at baseline for most ADL's. Today pt required mod A for bed mobility and max A for sit to stand from bed with more mod A to take steps towards the head of the bed. Pt presents very weak with limited B shoulder A/ROM with poor endurance in general. Pt was left in the bed with call bell within reach and family present. Pt will benefit from continued OT in the hospital and recommended venue below to increase strength, balance, and endurance for safe ADL's.         Recommendations for follow up therapy are one component of a multi-disciplinary discharge planning process, led by the attending physician.  Recommendations may be updated based on patient status, additional functional criteria and  insurance authorization.   Assistance Recommended at Discharge Intermittent Supervision/Assistance  Patient can return home with the following A lot of help with walking and/or transfers;A lot of help with bathing/dressing/bathroom;Assistance with cooking/housework;Assist for transportation;Help with stairs or ramp for entrance    Functional Status Assessment  Patient has had a recent decline in their functional status and demonstrates the ability to make significant improvements in function in a reasonable and predictable amount of time.  Equipment Recommendations  None recommended by OT           Precautions / Restrictions Precautions Precautions: Fall Restrictions Weight Bearing Restrictions: No      Mobility Bed Mobility Overal bed mobility: Needs Assistance Bed Mobility: Supine to Sit     Supine to sit: Mod assist, HOB elevated     General bed mobility comments: Slow labored movement; assist to move B LE and to pull to sit.    Transfers Overall transfer level: Needs assistance Equipment used: Rolling walker (2 wheels) Transfers: Sit to/from Stand Sit to Stand: Max assist           General transfer comment: Max A to stand from EOB; more mod A to take steps to L side with RW.      Balance Overall balance assessment: Needs assistance Sitting-balance support: Feet supported, No upper extremity supported Sitting balance-Leahy Scale: Fair Sitting balance - Comments: fair/good seated at EOB   Standing balance support: During functional activity, Bilateral upper extremity supported, Reliant on assistive device for balance Standing balance-Leahy Scale: Poor Standing balance comment: using RW  ADL either performed or assessed with clinical judgement   ADL Overall ADL's : Needs assistance/impaired     Grooming: Moderate assistance;Sitting   Upper Body Bathing: Moderate assistance;Sitting   Lower Body Bathing: Maximal  assistance;Total assistance;Bed level   Upper Body Dressing : Moderate assistance;Sitting   Lower Body Dressing: Maximal assistance;Total assistance;Bed level Lower Body Dressing Details (indicate cue type and reason): assisted to don socks while supine in bed Toilet Transfer: Maximal assistance;Rolling walker (2 wheels);Moderate assistance;Stand-pivot Toilet Transfer Details (indicate cue type and reason): Partially simulated via sit to stand from EOB and steps to L side with RW. Toileting- Clothing Manipulation and Hygiene: Maximal assistance;Total assistance;Bed level       Functional mobility during ADLs: Moderate assistance;Maximal assistance;Rollator (4 wheels)       Vision Baseline Vision/History: 1 Wears glasses Ability to See in Adequate Light: 0 Adequate Patient Visual Report: No change from baseline Vision Assessment?: No apparent visual deficits                Pertinent Vitals/Pain Pain Assessment Pain Assessment: Faces Faces Pain Scale: Hurts little more Pain Location: B LE with movement Pain Descriptors / Indicators: Grimacing Pain Intervention(s): Limited activity within patient's tolerance, Monitored during session, Repositioned     Hand Dominance Right   Extremity/Trunk Assessment Upper Extremity Assessment Upper Extremity Assessment: Generalized weakness (2+/5 B shoulder flexion and abduction: WFL P/ROM for the two. Generally weak otherwise.)   Lower Extremity Assessment Lower Extremity Assessment: Defer to PT evaluation   Cervical / Trunk Assessment Cervical / Trunk Assessment: Normal   Communication Communication Communication: No difficulties   Cognition Arousal/Alertness: Awake/alert Behavior During Therapy: WFL for tasks assessed/performed Overall Cognitive Status: Within Functional Limits for tasks assessed                                                        Home Living Family/patient expects to be discharged  to:: Private residence Living Arrangements: Children Available Help at Discharge: Family;Available PRN/intermittently Type of Home: House Home Access: Ramped entrance     Home Layout: Two level Alternate Level Stairs-Number of Steps: Patient does not go upstairs   Bathroom Shower/Tub: Sponge bathes at baseline   Bathroom Toilet: Handicapped height Bathroom Accessibility: Yes   Home Equipment: Rollator (4 wheels);BSC/3in1   Additional Comments: per PT note      Prior Functioning/Environment Prior Level of Function : Needs assist       Physical Assist : Mobility (physical);ADLs (physical) Mobility (physical): Bed mobility;Transfers;Gait;Stairs ADLs (physical): Bathing;Dressing;Toileting;IADLs Mobility Comments: household ambulator using Rollator ADLs Comments: Assisted by [employer]; able to groom and feeding independently only.        OT Problem List: Decreased strength;Decreased range of motion;Decreased activity tolerance;Impaired balance (sitting and/or standing);Obesity;Impaired UE functional use      OT Treatment/Interventions: Self-care/ADL training;Therapeutic exercise;Energy conservation;Therapeutic activities;Patient/family education;Balance training    OT Goals(Current goals can be found in the care plan section) Acute Rehab OT Goals Patient Stated Goal: Improve function and rehab. OT Goal Formulation: With patient Time For Goal Achievement: 06/17/23 Potential to Achieve Goals: Good  OT Frequency: Min 2X/week    Co-evaluation                               End of Session Equipment  Utilized During Treatment: Rolling walker (2 wheels)  Activity Tolerance: Patient tolerated treatment well Patient left: in bed;with call bell/phone within reach;with family/visitor present  OT Visit Diagnosis: Unsteadiness on feet (R26.81);Other abnormalities of gait and mobility (R26.89);Muscle weakness (generalized) (M62.81)                Time: 0216-0227 OT Time  Calculation (min): 11 min Charges:  OT General Charges $OT Visit: 1 Visit OT Evaluation $OT Eval Low Complexity: 1 Low  Yaron Grasse OT, MOT   Danie Chandler 06/03/2023, 2:57 PM

## 2023-06-03 NOTE — Progress Notes (Signed)
Spoke with the patient to see how she was doing since admission to the hospital.  She stated she is still very weak and having trouble walking.  She states once she is discharged from the hospital she will go to a skilled facility for rehab.  I asked if she wanted to continue with her radiation treatments if we could get transportation from the facility arranged to bring her daily.  She stated she needed some time to think about it and she may want to discontinue therapy. I let her know that we would cancel her treatments for the rest of this week and reassess on Monday.  She agreed to this plan.  Lind Covert RN, BSN

## 2023-06-03 NOTE — Plan of Care (Signed)
  Problem: Acute Rehab OT Goals (only OT should resolve) Goal: Pt. Will Perform Grooming Flowsheets (Taken 06/03/2023 1500) Pt Will Perform Grooming:  with supervision  sitting Goal: Pt. Will Perform Upper Body Dressing Flowsheets (Taken 06/03/2023 1500) Pt Will Perform Upper Body Dressing:  with supervision  sitting Goal: Pt. Will Perform Lower Body Dressing Flowsheets (Taken 06/03/2023 1500) Pt Will Perform Lower Body Dressing:  with mod assist  bed level Goal: Pt. Will Transfer To Toilet Flowsheets (Taken 06/03/2023 1500) Pt Will Transfer to Toilet:  with min guard assist  with supervision  stand pivot transfer Goal: Pt/Caregiver Will Perform Home Exercise Program Flowsheets (Taken 06/03/2023 1500) Pt/caregiver will Perform Home Exercise Program:  Increased ROM  Increased strength  Both right and left upper extremity  Independently  Oakley Orban OT, MOT

## 2023-06-03 NOTE — Progress Notes (Signed)
PROGRESS NOTE    [name]  MVH:[id number] DOB: [birth year] DOA: 06/01/2023 PCP: Gwenlyn Found, MD   Brief Narrative:  [name] is a 67 y.o. female with a history of rectal CA s/p chemo undergoing XRT, HTN, T2DM, HERD, hypothyroidism, anemia and thrombocytopenia who presented from home by EMS after a fall at home.  She reports worsening symptoms of weakness ambulatory dysfunction and diarrhea with poor p.o. intake since starting radiation several weeks ago.  Hospitalist called for admission in the setting of failure to thrive secondary to current treatment with profound ambulatory dysfunction. Assessment & Plan:   Principal Problem:   Weakness Active Problems:   Pancytopenia (HCC)   Emesis, persistent   Rectal cancer (HCC)   Morbid obesity (HCC)   Essential hypertension, benign   Type 2 diabetes mellitus with complication, with long-term current use of insulin (HCC)   GERD (gastroesophageal reflux disease)   Hypertension   Recurrent major depressive disorder, in partial remission (HCC)   Hypokalemia   Hypothyroidism following radioiodine therapy   Sepsis secondary to UTI (HCC)   Pressure injury of skin  Sepsis secondary to UTI, POA Urinary obstruction -Notable leukopenia, tachycardia, febrile with source likely UTI given abnormal UA and patient's urinary symptoms of increased frequency followed by urinary obstruction -Given multiple in and out catheterizations/skin breakdown from urinary incontinence will place Foley for the next 24 to 48 hours and follow clinically, voiding trial prior to discharge if possible, otherwise may need outpatient urology follow-up if Foley is unable to be removed at SNF -Continue ceftriaxone x 3 days minimally follow cultures, escalate as appropriate -Patient and family indicate she typically has episodes of weakness and mental status changes with UTI which are consistent with this event.  Ambulatory dysfunction, multiple falls Likely  exacerbated in the setting of acute UTI however prolonged symptoms likely secondary to recent initiation of radiation treatment and ongoing diarrhea.  No overt injuries, imaging negative thus far  Hallucinations/altered mental status; acute on chronic -Likely secondary to above sepsis/UTI given history -Follow-up for sundowning/delirium and polypharmacy given her history -These events appear to be nightly per family -no improved with increased dose of Zyprexa, will attempt Seroquel 100mg  HS instead   Rectal CA:  - Under care of Dr. Ellin Saba, getting XRT.    AKI with NAGMA, hypovolemic hyponatremia:  - IVF ongoing - Decrease IV fluids once p.o. intake is appropriate   Hypoalbuminemia Malnutrition, moderate to severe, POA Complicated by poor p.o. intake secondary to treatment and infection as above - RD consulted -recommend more liberalized diet given above   Diarrhea, acute on chronic, resolving:  -Continue home Imodium, likely exacerbating electrolyte abnormalities dehydration and symptoms as above   Hypokalemia: Severe, symptomatic Hypomagnesemia, POA -Continue to advance diet, supplement as appropriate   Chronic macrocytic anemia and thrombocytopenia: - Given active malignancy, continue DVT prophylaxis - Currently stable near baseline, no signs or symptoms of bleeding  Multiple wounds/skin tears/pressure injuries -See skin evaluation per nursing (coccyx, arms of note)  DVT prophylaxis: lovenox Code Status:   Code Status: DNR Family Communication: Daughter and grandson at bedside  Status is: Inpatient  Dispo: The patient is from: Home              Anticipated d/c is to: SNF              Anticipated d/c date is: 24 to 48 hours              Patient currently not medically  stable for discharge  Consultants:  Oncology Dr. Ellin Saba  Procedures:  None  Antimicrobials:  Ceftriaxone x 3 days  Subjective: No acute issues or events overnight, patient had episode of  sundowning/delirium with hallucinations yesterday evening which appears to be ongoing and routine even while at home.  Otherwise denies nausea vomiting diarrhea constipation headache fevers chills or chest pain.  Urinary hesitancy and difficulty urinating continues with questionable incontinence.  Objective: Vitals:   06/02/23 2052 06/03/23 0051 06/03/23 0054 06/03/23 0310  BP: 110/65 119/82  111/69  Pulse: (!) 101 (!) 109  90  Resp: 20 (!) 22 20 18   Temp: 98.2 F (36.8 C)   97.9 F (36.6 C)  TempSrc: Oral     SpO2: 97% 97%  99%  Weight:      Height:        Intake/Output Summary (Last 24 hours) at 06/03/2023 0722 Last data filed at 06/03/2023 0538 Gross per 24 hour  Intake 1335.88 ml  Output 930 ml  Net 405.88 ml   Filed Weights   06/01/23 0431 06/01/23 1241  Weight: 107 kg 113.5 kg    Examination:  General: No acute distress, resting comfortably in bed HEENT:  Normocephalic atraumatic.  Sclerae nonicteric, noninjected.  Extraocular movements intact bilaterally. Neck:  Without mass or deformity.  Trachea is midline. Lungs: Bibasilar rales, coarse breath sounds bilaterally without overt wheeze Heart:  Regular rate and rhythm.  Without murmurs, rubs, or gallops. Abdomen:  Soft, nontender, obese nondistended.  Without guarding or rebound. Extremities: Without cyanosis, clubbing Skin:  Warm and dry, no erythema.  Multiple noted skin excoriations, tears and pressure injuries over the bilateral upper extremities and coccyx/sacrum.  Data Reviewed: I have personally reviewed following labs and imaging studies  CBC: Recent Labs  Lab 06/01/23 0433 06/02/23 0443 06/03/23 0449  WBC 5.5 3.7* 4.0  HGB 10.7* 9.2* 9.5*  HCT 34.3* 29.8* 30.3*  MCV 100.6* 100.0 98.7  PLT 111* 79* 60*   Basic Metabolic Panel: Recent Labs  Lab 06/01/23 0433 06/01/23 1056 06/02/23 0443 06/03/23 0449  NA 132* 131* 129* 128*  K 2.9* 3.5 3.4* 3.7  CL 99 101 99 99  CO2 21* 22 21* 23  GLUCOSE  113* 126* 105* 131*  BUN 9 9 11 13   CREATININE 1.17* 1.04* 1.01* 1.01*  CALCIUM 7.1* 6.8* 7.0* 7.2*  MG 1.3*  --   --   --    GFR: Estimated Creatinine Clearance: 72.2 mL/min (A) (by C-G formula based on SCr of 1.01 mg/dL (H)). Liver Function Tests: Recent Labs  Lab 06/01/23 0433 06/03/23 0449  AST 33 30  ALT 13 15  ALKPHOS 133* 130*  BILITOT 1.1 1.3*  PROT 4.9* 4.9*  ALBUMIN 1.9* 1.8*   Recent Labs  Lab 06/01/23 0433  LIPASE 26   CBG: Recent Labs  Lab 06/02/23 0746 06/02/23 1135 06/02/23 1631 06/02/23 2054 06/03/23 0716  GLUCAP 104* 134* 134* 132* 116*   No results found for this or any previous visit (from the past 240 hour(s)).   Radiology Studies: No results found.      Scheduled Meds:  diphenhydrAMINE  25 mg Intravenous Once   enoxaparin (LOVENOX) injection  55 mg Subcutaneous Q24H   insulin aspart  0-15 Units Subcutaneous TID WC   insulin aspart  0-5 Units Subcutaneous QHS   levothyroxine  88 mcg Oral Q0600   magnesium oxide  800 mg Oral Daily   megestrol  400 mg Oral BID   nystatin   Topical  TID   OLANZapine  15 mg Oral QHS   pantoprazole  40 mg Oral BID AC   sodium chloride flush  3 mL Intravenous Q12H   Continuous Infusions:  cefTRIAXone (ROCEPHIN)  IV 1 g (06/02/23 1148)   lactated ringers 1,000 mL with potassium chloride 20 mEq infusion 100 mL/hr at 06/02/23 2104     LOS: 1 day    Time spent:    Azucena Fallen, DO [facility]  If 7PM-7AM, please contact night-coverage [email or web address]  06/03/2023, 7:22 AM

## 2023-06-04 ENCOUNTER — Ambulatory Visit: Payer: Medicare HMO

## 2023-06-04 DIAGNOSIS — R531 Weakness: Secondary | ICD-10-CM | POA: Diagnosis not present

## 2023-06-04 LAB — COMPREHENSIVE METABOLIC PANEL
ALT: 14 U/L (ref 0–44)
AST: 29 U/L (ref 15–41)
Albumin: 1.6 g/dL — ABNORMAL LOW (ref 3.5–5.0)
Alkaline Phosphatase: 120 U/L (ref 38–126)
Anion gap: 7 (ref 5–15)
BUN: 11 mg/dL (ref 8–23)
CO2: 23 mmol/L (ref 22–32)
Calcium: 7.3 mg/dL — ABNORMAL LOW (ref 8.9–10.3)
Chloride: 99 mmol/L (ref 98–111)
Creatinine, Ser: 0.99 mg/dL (ref 0.44–1.00)
GFR, Estimated: >60 mL/min (ref >60)
Glucose, Bld: 117 mg/dL — ABNORMAL HIGH (ref 70–99)
Potassium: 3.8 mmol/L (ref 3.5–5.1)
Sodium: 129 mmol/L — ABNORMAL LOW (ref 135–145)
Total Bilirubin: 1 mg/dL (ref 0.3–1.2)
Total Protein: 4.5 g/dL — ABNORMAL LOW (ref 6.5–8.1)

## 2023-06-04 LAB — GLUCOSE, CAPILLARY
Glucose-Capillary: 107 mg/dL — ABNORMAL HIGH (ref 70–99)
Glucose-Capillary: 166 mg/dL — ABNORMAL HIGH (ref 70–99)
Glucose-Capillary: 162 mg/dL — ABNORMAL HIGH (ref 70–99)
Glucose-Capillary: 119 mg/dL — ABNORMAL HIGH (ref 70–99)

## 2023-06-04 LAB — CBC
HCT: 27.7 % — ABNORMAL LOW (ref 36.0–46.0)
Hemoglobin: 8.7 g/dL — ABNORMAL LOW (ref 12.0–15.0)
MCH: 30.6 pg (ref 26.0–34.0)
MCHC: 31.4 g/dL (ref 30.0–36.0)
MCV: 97.5 fL (ref 80.0–100.0)
Platelets: 50 10*3/uL — ABNORMAL LOW (ref 150–400)
RBC: 2.84 MIL/uL — ABNORMAL LOW (ref 3.87–5.11)
RDW: 17.2 % — ABNORMAL HIGH (ref 11.5–15.5)
WBC: 2.8 10*3/uL — ABNORMAL LOW (ref 4.0–10.5)
nRBC: 0 % (ref 0.0–0.2)

## 2023-06-04 MED ORDER — OXYCODONE HCL 5 MG PO TABS
5.0000 mg | ORAL_TABLET | Freq: Once | ORAL | Status: AC | PRN
  Administered 2023-06-04: 5 mg via ORAL

## 2023-06-04 MED ORDER — CEPHALEXIN 250 MG PO CAPS
250.0000 mg | ORAL_CAPSULE | Freq: Four times a day (QID) | ORAL | Status: AC
  Administered 2023-06-04 – 2023-06-05 (×6): 250 mg via ORAL
  Filled 2023-06-04 (×7): qty 1

## 2023-06-04 MED ORDER — CHLORHEXIDINE GLUCONATE CLOTH 2 % EX PADS
6.0000 | MEDICATED_PAD | Freq: Every day | CUTANEOUS | Status: DC
  Administered 2023-06-04 – 2023-06-07 (×4): 6 via TOPICAL

## 2023-06-04 NOTE — Progress Notes (Addendum)
PROGRESS NOTE    MIRONDA AIELLO  WUJ:[id number] DOB: 11-01-1956 DOA: 06/01/2023 PCP: Gwenlyn Found, MD   Brief Narrative:  [name] is a 67 y.o. female with a history of rectal CA s/p chemo undergoing XRT, HTN, T2DM, HERD, hypothyroidism, anemia and thrombocytopenia who presented from home by EMS after a fall at home.  She reports worsening symptoms of weakness ambulatory dysfunction and diarrhea with poor p.o. intake since starting radiation several weeks ago.  Hospitalist called for admission in the setting of failure to thrive secondary to current treatment with profound ambulatory dysfunction. Assessment & Plan:   Principal Problem:   Weakness Active Problems:   Pancytopenia (HCC)   Emesis, persistent   Rectal cancer (HCC)   Morbid obesity (HCC)   Essential hypertension, benign   Type 2 diabetes mellitus with complication, with long-term current use of insulin (HCC)   GERD (gastroesophageal reflux disease)   Hypertension   Recurrent major depressive disorder, in partial remission (HCC)   Hypokalemia   Hypothyroidism following radioiodine therapy   Sepsis secondary to UTI (HCC)   Pressure injury of skin   Sepsis secondary to UTI, POA Urinary obstruction -Notable leukopenia, tachycardia, febrile with source likely UTI given abnormal UA and patient's urinary symptoms of increased frequency followed by urinary obstruction -Given multiple in and out catheterizations/skin breakdown from urinary incontinence - Foley ongoing -hoping for voiding trial in next 24 to 48 hours pending patient's physical therapy and ability to ambulate -Transition to cephalexin for remainder of antibiotic course through 06-05-23 -Patient and family indicate she has had transient episodes of weakness and mental status with UTIs but recovers quickly.  During this hospitalization patient has recovered minimally likely secondary to multiple comorbid conditions concurrent with UTI.   Ambulatory dysfunction,  multiple falls -Worsening ambulatory dysfunction in the setting of sepsis as above.   -Patient is currently unsafe for discharge home given increased needs for assistance with limited to no help at home. -PT recommending ongoing physical therapy and evaluation at skilled facility, ultimate disposition would be discharged home once patient is more independent and safe from an ambulation and strength standpoint  Hallucinations/altered mental status; acute on chronic, resolving -Likely secondary to above sepsis/UTI given history -Follow-up for sundowning/delirium and polypharmacy given her history -Patient reports essentially resolution of hallucinations last night with transition to Seroquel, will continue 100 mg at bedtime ongoing, discussed with patient this can be titrated if she becomes more somnolent or has worsening hallucinations or issues.   Rectal CA:  - Under care of Dr. Ellin Saba, currently undergoing XRT.    AKI with NAGMA, hypovolemic hyponatremia:  -P.o. intake increasing, IV fluids discontinued given IV dysfunction   Hypoalbuminemia Malnutrition, moderate to severe, POA Complicated by poor p.o. intake secondary to treatment and infection as above - RD consulted -recommend more liberalized diet given above   Diarrhea, acute on chronic, resolving:  -Continue home Imodium, likely exacerbating electrolyte abnormalities dehydration and symptoms as above   Hypokalemia: Severe, symptomatic Hypomagnesemia, POA -Continue to advance diet, supplement as appropriate   Chronic macrocytic anemia and thrombocytopenia: - Given active malignancy, continue DVT prophylaxis - Currently stable near baseline, no signs or symptoms of bleeding  Multiple wounds/skin tears/pressure injuries -See skin evaluation per nursing (multiple skin excoriations, pressure injury, and tears -most notably bilateral arms and right coccyx   DVT prophylaxis: lovenox Code Status:   Code Status: DNR Family  Communication: None present  Status is: Inpatient  Dispo: The patient is from: Home  Anticipated d/c is to: SNF              Anticipated d/c date is: 24 to 48 hours              Patient currently IS medically stable for discharge  Consultants:  Oncology Dr. Ellin Saba  Procedures:  None  Antimicrobials:  Ceftriaxone x 2 days - cephalexin to complete 7/13  Subjective: No acute issues or events overnight, difficulty with IV replacement overnight, discontinued medications transition to p.o.  Patient indicates essential resolution of prior daily hallucinations she has been experiencing nightly over the past week to 2 weeks now on Seroquel.  Objective: Vitals:   06/03/23 1346 06/03/23 2012 06/03/23 2046 06/04/23 0427  BP: (!) 108/58 92/62 98/63  90/62  Pulse: 95 89 92 95  Resp: 16 18 20 20   Temp: 97.8 F (36.6 C) 98.4 F (36.9 C) 98.1 F (36.7 C) 98.4 F (36.9 C)  TempSrc: Oral Oral Oral Oral  SpO2: 97% 99% 100% 98%  Weight:      Height:        Intake/Output Summary (Last 24 hours) at 06/04/2023 0755 Last data filed at 06/04/2023 0500 Gross per 24 hour  Intake 480 ml  Output 2000 ml  Net -1520 ml   Filed Weights   06/01/23 0431 06/01/23 1241  Weight: 107 kg 113.5 kg    Examination:  General: No acute distress, resting comfortably in bed HEENT:  Normocephalic atraumatic.  Sclerae nonicteric, noninjected.  Extraocular movements intact bilaterally. Neck:  Without mass or deformity.  Trachea is midline. Lungs: Bibasilar rales, coarse breath sounds bilaterally without overt wheeze Heart:  Regular rate and rhythm.  Without murmurs, rubs, or gallops. Abdomen:  Soft, nontender, obese nondistended.  Without guarding or rebound. Extremities: Without cyanosis, clubbing Skin:  Warm and dry, no erythema.  Multiple noted skin excoriations, tears and pressure injuries over the bilateral upper extremities and coccyx/sacrum.  Data Reviewed: I have personally reviewed  following labs and imaging studies  CBC: Recent Labs  Lab 06/01/23 0433 06/02/23 0443 06/03/23 0449 06/04/23 0432  WBC 5.5 3.7* 4.0 2.8*  HGB 10.7* 9.2* 9.5* 8.7*  HCT 34.3* 29.8* 30.3* 27.7*  MCV 100.6* 100.0 98.7 97.5  PLT 111* 79* 60* 50*   Basic Metabolic Panel: Recent Labs  Lab 06/01/23 0433 06/01/23 1056 06/02/23 0443 06/03/23 0449 06/04/23 0432  NA 132* 131* 129* 128* 129*  K 2.9* 3.5 3.4* 3.7 3.8  CL 99 101 99 99 99  CO2 21* 22 21* 23 23  GLUCOSE 113* 126* 105* 131* 117*  BUN 9 9 11 13 11   CREATININE 1.17* 1.04* 1.01* 1.01* 0.99  CALCIUM 7.1* 6.8* 7.0* 7.2* 7.3*  MG 1.3*  --   --   --   --    GFR: Estimated Creatinine Clearance: 73.7 mL/min (by C-G formula based on SCr of 0.99 mg/dL). Liver Function Tests: Recent Labs  Lab 06/01/23 0433 06/03/23 0449 06/04/23 0432  AST 33 30 29  ALT 13 15 14   ALKPHOS 133* 130* 120  BILITOT 1.1 1.3* 1.0  PROT 4.9* 4.9* 4.5*  ALBUMIN 1.9* 1.8* 1.6*   Recent Labs  Lab 06/01/23 0433  LIPASE 26   CBG: Recent Labs  Lab 06/03/23 0716 06/03/23 1105 06/03/23 1612 06/03/23 2044 06/04/23 0732  GLUCAP 116* 137* 170* 139* 107*   No results found for this or any previous visit (from the past 240 hour(s)).   Radiology Studies: No results found.      Scheduled  Meds:  diphenhydrAMINE  25 mg Intravenous Once   enoxaparin (LOVENOX) injection  55 mg Subcutaneous Q24H   insulin aspart  0-15 Units Subcutaneous TID WC   insulin aspart  0-5 Units Subcutaneous QHS   levothyroxine  88 mcg Oral Q0600   magnesium oxide  800 mg Oral Daily   megestrol  400 mg Oral BID   nystatin   Topical TID   pantoprazole  40 mg Oral BID AC   QUEtiapine  100 mg Oral QHS   sodium chloride flush  3 mL Intravenous Q12H   Continuous Infusions:  cefTRIAXone (ROCEPHIN)  IV 1 g (06/03/23 1214)   lactated ringers 1,000 mL with potassium chloride 20 mEq infusion 100 mL/hr at 06/03/23 0809     LOS: 2 days    Time spent:     Azucena Fallen, DO [facility]  If 7PM-7AM, please contact night-coverage [email or web address]  06/04/2023, 7:55 AM

## 2023-06-04 NOTE — Care Management Important Message (Signed)
Important Message  Patient Details  Name: [name] MRN: [id number] Date of Birth: May 22, 1956   Medicare Important Message Given:  Yes     Corey Harold 06/04/2023, 12:25 PM

## 2023-06-04 NOTE — Progress Notes (Addendum)
Physical Therapy Treatment Patient Details Name: [name] MRN: [id number] DOB: 1956-10-25 Today's Date: 06/04/2023   History of Present Illness [name] is a 67 y.o. female with a history of rectal CA s/p chemo undergoing XRT, HTN, T2DM, HERD, hypothyroidism, anemia and thrombocytopenia who presented from home by EMS after a fall at home. She has been feeling gradually more weak diffusely for several weeks since starting radiation therapy (next due today) and having diarrhea regularly, not taking adequate oral intake over past week, and often has vomiting. She came in after a trip and fall at home today despite using her rolling walker. She feels "terrible." Denies fever, chills, HA, chest pain, cough, dyspnea, abd pain, dysuria, frequent or urgent urination, overt GI bleeding. She bruises very easily, not on blood thinner, and has had scrapes from falls but denies any specific pain at this time. She was afebrile with normal WBC, blood counts at their baselines. SCr 1.17 with K 2.9 indicating AKI. Radiographic work up has been negative for acute fracture or dislocations. IVF, zofran, K supplementation was given and admission requested.    PT Comments  Pt supine in bed with grandson present in room and willing to participate with therapy today.  Pt limited by weakness and fatigue with mod A required for bed mobility, transfers and gait.  Elevated bed height and cueing to push hands on bed vs pull on walker to assist with standing.  EOS pt left in chair with call bell within reach and grandson present in room.    1530:  Pt assisted back to bed with max A due to fatigue and weakness.  Pt left in bed with call bell within reach and grandson present in room.    Assistance Recommended at Discharge    If plan is discharge home, recommend the following:  Can travel by private vehicle           Equipment Recommendations       Recommendations for Other Services       Precautions /  Restrictions Precautions Precautions: Fall Restrictions Weight Bearing Restrictions: No     Mobility  Bed Mobility Overal bed mobility: Needs Assistance       Supine to sit: Mod assist, HOB elevated     General bed mobility comments: Slow labored movement; assist to move B LE and to pull to sit.    Transfers Overall transfer level: Needs assistance Equipment used: Rolling walker (2 wheels) Transfers: Sit to/from Stand Sit to Stand: Mod assist, From elevated surface           General transfer comment: Mod A with elevated surface and cueing for hand placement to assist with standing    Ambulation/Gait Ambulation/Gait assistance: Mod assist Gait Distance (Feet): 4 Feet Assistive device: Rolling walker (2 wheels) Gait Pattern/deviations: Decreased step length - right, Decreased step length - left, Decreased stride length Gait velocity: slow     General Gait Details: limited to a few side steps before having to sit due to c/o fatigue and weakness   Stairs             Wheelchair Mobility     Tilt Bed    Modified Rankin (Stroke Patients Only)       Balance  Cognition Arousal/Alertness: Awake/alert Behavior During Therapy: WFL for tasks assessed/performed Overall Cognitive Status: Within Functional Limits for tasks assessed                                          Exercises      General Comments        Pertinent Vitals/Pain Pain Assessment Pain Assessment: 0-10 Pain Score: 7  Pain Location: abdominal pain Pain Descriptors / Indicators: Discomfort Pain Intervention(s): Monitored during session, Limited activity within patient's tolerance, Repositioned    Home Living                          Prior Function            PT Goals (current goals can now be found in the care plan section)      Frequency           PT Plan Current plan remains  appropriate    Co-evaluation              AM-PAC PT "6 Clicks" Mobility   Outcome Measure  Help needed turning from your back to your side while in a flat bed without using bedrails?: A Little Help needed moving from lying on your back to sitting on the side of a flat bed without using bedrails?: A Lot Help needed moving to and from a bed to a chair (including a wheelchair)?: A Lot Help needed standing up from a chair using your arms (e.g., wheelchair or bedside chair)?: A Lot Help needed to walk in hospital room?: A Lot Help needed climbing 3-5 steps with a railing? : A Lot 6 Click Score: 13    End of Session Equipment Utilized During Treatment: Gait belt Activity Tolerance: Patient tolerated treatment well;Patient limited by fatigue Patient left: in chair;with family/visitor present Nurse Communication: Mobility status PT Visit Diagnosis: Unsteadiness on feet (R26.81);Other abnormalities of gait and mobility (R26.89);Muscle weakness (generalized) (M62.81)     Time: 1610-9604 PT Time Calculation (min) (ACUTE ONLY): 15 min  Charges:    $Therapeutic Activity: 8-22 mins PT General Charges $$ ACUTE PT VISIT: 1 Visit                     Becky Sax, LPTA/CLT; CBIS [phone]  Juel Burrow 06/04/2023, 3:24 PM

## 2023-06-04 NOTE — Progress Notes (Signed)
Occupational Therapy Treatment Patient Details Name: [name] MRN: [id number] DOB: 03-31-1956 Today's Date: 06/04/2023   History of present illness [name] is a 67 y.o. female with a history of rectal CA s/p chemo undergoing XRT, HTN, T2DM, HERD, hypothyroidism, anemia and thrombocytopenia who presented from home by EMS after a fall at home. She has been feeling gradually more weak diffusely for several weeks since starting radiation therapy (next due today) and having diarrhea regularly, not taking adequate oral intake over past week, and often has vomiting. She came in after a trip and fall at home today despite using her rolling walker. She feels "terrible." Denies fever, chills, HA, chest pain, cough, dyspnea, abd pain, dysuria, frequent or urgent urination, overt GI bleeding. She bruises very easily, not on blood thinner, and has had scrapes from falls but denies any specific pain at this time. She was afebrile with normal WBC, blood counts at their baselines. SCr 1.17 with K 2.9 indicating AKI. Radiographic work up has been negative for acute fracture or dislocations. IVF, zofran, K supplementation was given and admission requested.   OT comments  Pt agreeable to OT session. Mod assist with HOB elevated for bed mobility and OT managing LE. Pt able to maintain balance at EOB. Attempted to complete sit to stand t/f with max assist and RW but unable to fully stand. Pt sat EOB for 3 minutes independently. Pt with skin tears to BL UE, RN aware and came in at end of session to apply bandages. RN assisting OT to boost pt up in bed- updated RN on mobility. Pt left in bed with call bell and nursing present.    Recommendations for follow up therapy are one component of a multi-disciplinary discharge planning process, led by the attending physician.  Recommendations may be updated based on patient status, additional functional criteria and insurance authorization.    Assistance Recommended at  Discharge Intermittent Supervision/Assistance  Patient can return home with the following  A lot of help with walking and/or transfers;A lot of help with bathing/dressing/bathroom;Assistance with cooking/housework;Assist for transportation;Help with stairs or ramp for entrance   Equipment Recommendations  None recommended by OT          Precautions / Restrictions Precautions Precautions: Fall Restrictions Weight Bearing Restrictions: No       Mobility Bed Mobility Overal bed mobility: Needs Assistance Bed Mobility: Supine to Sit     Supine to sit: Mod assist, HOB elevated     General bed mobility comments: Slow labored movement; assist to move B LE and to pull to sit.    Transfers Overall transfer level: Needs assistance Equipment used: Rolling walker (2 wheels) Transfers: Sit to/from Stand Sit to Stand: Max assist           General transfer comment: unable to fully stand with max assist     Balance Overall balance assessment: Needs assistance Sitting-balance support: Feet supported, No upper extremity supported Sitting balance-Leahy Scale: Fair Sitting balance - Comments: fair/good seated at EOB   Standing balance support: During functional activity, Bilateral upper extremity supported, Reliant on assistive device for balance                                Extremity/Trunk Assessment Upper Extremity Assessment Upper Extremity Assessment: Generalized weakness   Lower Extremity Assessment Lower Extremity Assessment: Defer to PT evaluation  Cognition Arousal/Alertness: Awake/alert Behavior During Therapy: WFL for tasks assessed/performed Overall Cognitive Status: Within Functional Limits for tasks assessed                                                       General Comments skin tears on BL arms- nursing aware    Pertinent Vitals/ Pain       Pain Assessment Pain Assessment:  Faces Faces Pain Scale: Hurts little more Pain Location: B LE with movement Pain Descriptors / Indicators: Grimacing Pain Intervention(s): Monitored during session, Limited activity within patient's tolerance                                                          Frequency  Min 2X/week        Progress Toward Goals  OT Goals(current goals can now be found in the care plan section)  Progress towards OT goals: Progressing toward goals  ADL Goals Pt Will Perform Grooming: with supervision;sitting Pt Will Perform Upper Body Dressing: with supervision;sitting Pt Will Perform Lower Body Dressing: with mod assist;bed level Pt Will Transfer to Toilet: with min guard assist;with supervision;stand pivot transfer Pt/caregiver will Perform Home Exercise Program: Increased ROM;Increased strength;Both right and left upper extremity;Independently  Plan Discharge plan remains appropriate       End of Session Equipment Utilized During Treatment: Rolling walker (2 wheels)  OT Visit Diagnosis: Unsteadiness on feet (R26.81);Other abnormalities of gait and mobility (R26.89);Muscle weakness (generalized) (M62.81)   Activity Tolerance Patient tolerated treatment well   Patient Left in bed;with call bell/phone within reach;with nursing/sitter in room   Nurse Communication Mobility status        Time: 1308-6578 OT Time Calculation (min): 25 min  Charges: OT General Charges $OT Visit: 1 Visit OT Treatments $Self Care/Home Management : 23-37 mins   Bevelyn Ngo, OTR/L  06/04/2023, 11:15 AM

## 2023-06-05 DIAGNOSIS — R531 Weakness: Secondary | ICD-10-CM | POA: Diagnosis not present

## 2023-06-05 LAB — CBC
HCT: 28.7 % — ABNORMAL LOW (ref 36.0–46.0)
Hemoglobin: 9 g/dL — ABNORMAL LOW (ref 12.0–15.0)
MCH: 31 pg (ref 26.0–34.0)
MCHC: 31.4 g/dL (ref 30.0–36.0)
MCV: 99 fL (ref 80.0–100.0)
Platelets: 49 10*3/uL — ABNORMAL LOW (ref 150–400)
RBC: 2.9 MIL/uL — ABNORMAL LOW (ref 3.87–5.11)
RDW: 17.3 % — ABNORMAL HIGH (ref 11.5–15.5)
WBC: 2.5 10*3/uL — ABNORMAL LOW (ref 4.0–10.5)
nRBC: 0 % (ref 0.0–0.2)

## 2023-06-05 LAB — GLUCOSE, CAPILLARY
Glucose-Capillary: 152 mg/dL — ABNORMAL HIGH (ref 70–99)
Glucose-Capillary: 99 mg/dL (ref 70–99)
Glucose-Capillary: 117 mg/dL — ABNORMAL HIGH (ref 70–99)
Glucose-Capillary: 134 mg/dL — ABNORMAL HIGH (ref 70–99)

## 2023-06-05 LAB — BASIC METABOLIC PANEL
Anion gap: 6 (ref 5–15)
BUN: 10 mg/dL (ref 8–23)
CO2: 24 mmol/L (ref 22–32)
Calcium: 7.3 mg/dL — ABNORMAL LOW (ref 8.9–10.3)
Chloride: 100 mmol/L (ref 98–111)
Creatinine, Ser: 0.91 mg/dL (ref 0.44–1.00)
GFR, Estimated: >60 mL/min (ref >60)
Glucose, Bld: 110 mg/dL — ABNORMAL HIGH (ref 70–99)
Potassium: 3.6 mmol/L (ref 3.5–5.1)
Sodium: 130 mmol/L — ABNORMAL LOW (ref 135–145)

## 2023-06-05 LAB — MAGNESIUM: Magnesium: 1.6 mg/dL — ABNORMAL LOW (ref 1.7–2.4)

## 2023-06-05 MED ORDER — QUETIAPINE FUMARATE 100 MG PO TABS: 100.0000 mg | ORAL_TABLET | Freq: Every day | ORAL | 0 refills | Status: DC

## 2023-06-05 MED ORDER — MAGNESIUM SULFATE 2 GM/50ML IV SOLN: 2.0000 g | Freq: Once | INTRAVENOUS | Status: DC

## 2023-06-05 MED ORDER — MEGESTROL ACETATE 400 MG/10ML PO SUSP: 400.0000 mg | Freq: Two times a day (BID) | ORAL | 0 refills | Status: DC

## 2023-06-05 MED ORDER — HYDROCODONE-ACETAMINOPHEN 5-325 MG PO TABS
1.0000 | ORAL_TABLET | Freq: Four times a day (QID) | ORAL | 0 refills | Status: DC | PRN
Start: 2023-06-05 — End: 2023-07-19

## 2023-06-05 NOTE — Progress Notes (Signed)
Foley removed per order to attempt voiding trial.

## 2023-06-05 NOTE — Discharge Summary (Signed)
Physician Discharge Summary  [name] ZOX:[id number] DOB: 28-Aug-1956 DOA: 06/01/2023  PCP: Gwenlyn Found, MD  Admit date: 06/01/2023  Discharge date: 06/05/2023  Admitted From:Home  Disposition:  SNF  Recommendations for Outpatient Follow-up:  Follow up with PCP in 1-2 weeks Continue on Seroquel as prescribed nightly for delirium/hallucinations Patient has completed course of antibiotics during the course of the stay and has no further need for them Continue ongoing care for rectal carcinoma per Dr. Ellin Saba Continue other home medications as previously prescribed  Home Health: None  Equipment/Devices: None  Discharge Condition:Stable  CODE STATUS: DNR  Diet recommendation: Heart Healthy  Brief/Interim Summary:  [name] is a 67 y.o. female with a history of rectal CA s/p chemo undergoing XRT, HTN, T2DM, HERD, hypothyroidism, anemia and thrombocytopenia who presented from home by EMS after a fall at home.  She reports worsening symptoms of weakness ambulatory dysfunction and diarrhea with poor p.o. intake since starting radiation several weeks ago.  Hospitalist called for admission in the setting of failure to thrive secondary to current treatment with profound ambulatory dysfunction.  Patient was admitted for sepsis secondary to UTI in the setting of urinary obstruction and required Foley catheter placement.  She is continue course of antibiotics at this point and is overall in stable condition for discharge.  She was noted to have ambulatory dysfunction with multiple falls and has been seen by PT with recommendations for SNF which has now been arranged.  She also had hallucinations/altered mentation in the setting of UTI which has now resolved after clearance of the infection as well as use of Seroquel nightly.  No other acute events or concerns noted and she is in stable condition for discharge.  Discharge Diagnoses:  Principal Problem:   Weakness Active Problems:    Pancytopenia (HCC)   Emesis, persistent   Rectal cancer (HCC)   Morbid obesity (HCC)   Essential hypertension, benign   Type 2 diabetes mellitus with complication, with long-term current use of insulin (HCC)   GERD (gastroesophageal reflux disease)   Hypertension   Recurrent major depressive disorder, in partial remission (HCC)   Hypokalemia   Hypothyroidism following radioiodine therapy   Sepsis secondary to UTI (HCC)   Pressure injury of skin  Principal discharge diagnosis: Acute metabolic encephalopathy and weakness secondary to sepsis, present on admission from UTI with associated urinary obstruction.  Discharge Instructions  Discharge Instructions     Diet - low sodium heart healthy   Complete by: As directed    If the dressing is still on your incision site when you go home, remove it on the third day after your surgery date. Remove dressing if it begins to fall off, or if it is dirty or damaged before the third day.   Complete by: As directed    Increase activity slowly   Complete by: As directed       Allergies as of 06/05/2023       Reactions   Contrast Media [iodinated Contrast Media] Other (See Comments)   Burning   Sulfa Antibiotics Nausea And Vomiting   Other reaction(s): GI Upset (intolerance) unknown   Doxepin Rash, Swelling   Tape Rash        Medication List     STOP taking these medications    FLUoxetine 20 MG capsule Commonly known as: PROZAC   OLANZapine 5 MG tablet Commonly known as: ZyPREXA       TAKE these medications    blood glucose meter  kit and supplies Kit Dispense based on patient and insurance preference. Use up to four times daily as directed. (FOR ICD-10 E11.65) Number of Strips 400 Number of Lancets 400   diphenoxylate-atropine 2.5-0.025 MG tablet Commonly known as: LOMOTIL Take 1 tablet by mouth 4 (four) times daily. What changed: how much to take   Droplet Pen Needles 31G X 5 MM Misc Generic drug: Insulin Pen  Needle USE AS INSTRUCTED TO INJECT INSULIN FOUR TIMES DAILY   Feeding Tube Attachment Device Misc 3 in 1 Bedside commode   FLUOROURACIL IV Inject into the vein every 14 (fourteen) days.   HYDROcodone-acetaminophen 5-325 MG tablet Commonly known as: NORCO/VICODIN Take 1 tablet by mouth every 6 (six) hours as needed for moderate pain. What changed: See the new instructions.   IRINOTECAN HCL IV Inject into the vein every 14 (fourteen) days.   Lantus SoloStar 100 UNIT/ML Solostar Pen Generic drug: insulin glargine INJECT 40 UNITS UNDER THE SKIN AT BEDTIME   LEUCOVORIN CALCIUM IV Inject into the vein every 14 (fourteen) days.   levothyroxine 88 MCG tablet Commonly known as: SYNTHROID TAKE 1 TABLET EVERY DAY BEFORE BREAKFAST What changed: See the new instructions.   loperamide 2 MG capsule Commonly known as: IMODIUM Take 1 capsule (2 mg total) by mouth as needed for diarrhea or loose stools. TAKE 1 CAPSULE AS DIRECTED AS NEEDED FOR DIARRHEA OR LOOSE STOOLS   magnesium oxide 400 (240 Mg) MG tablet Commonly known as: MAG-OX Take 2 tablets (800 mg total) by mouth daily.   megestrol 400 MG/10ML suspension Commonly known as: MEGACE Take 10 mLs (400 mg total) by mouth 2 (two) times daily.   NovoLOG FlexPen 100 UNIT/ML FlexPen Generic drug: insulin aspart INJECT 0 TO 6 UNITS UNDER SKIN THREE TIMES DAILY WITH MEALS (DISCARD PEN 28 DAYS AFTER OPENING)   ondansetron 4 MG disintegrating tablet Commonly known as: ZOFRAN-ODT Take 1 tablet (4 mg total) by mouth every 8 (eight) hours as needed for nausea or vomiting. DISSOLVE 2 TAB ON TONGUE EVERY 8HR AS NEEDED NAUSEA, VOMITING (BETWEEN COMPAZINE DOSES FOR MAX RELIEF) Strength: 4 mg   OXALIPLATIN IV Inject into the vein every 14 (fourteen) days.   pantoprazole 40 MG tablet Commonly known as: PROTONIX Take 1 tablet (40 mg total) by mouth 2 (two) times daily before a meal.   Potassium Chloride 40 MEQ/15ML (20%) Soln Take 15 mLs by  mouth 3 (three) times daily.   prochlorperazine 10 MG tablet Commonly known as: COMPAZINE Take 1 tablet (10 mg total) by mouth every 8 (eight) hours as needed for nausea or vomiting. TAKE 1 TABLET EVERY 6 HOURS AS NEEDED FOR NAUSEA OR VOMITING Strength: 10 mg   QUEtiapine 100 MG tablet Commonly known as: SEROQUEL Take 1 tablet (100 mg total) by mouth at bedtime.   Rollator Ultra-Light Misc Rollator walker   True Metrix Blood Glucose Test test strip Generic drug: glucose blood TEST BLOOD SUGAR TWICE DAILY   True Metrix Meter w/Device Kit 1 each by Does not apply route 2 (two) times daily. Use to test BG bid E11.65               Discharge Care Instructions  (From admission, onward)           Start     Ordered   06/05/23 0000  If the dressing is still on your incision site when you go home, remove it on the third day after your surgery date. Remove dressing if it begins to  fall off, or if it is dirty or damaged before the third day.        06/05/23 0853            Contact information for follow-up providers     Eksir, Nira Retort, MD. Schedule an appointment as soon as possible for a visit in 1 week(s).   Specialty: Family Medicine Contact information: [address] [place] [address] [phone]              Contact information for after-discharge care     Destination     [facility] Preferred SNF .   Service: Skilled Nursing Contact information: [address] [place] [address] [phone]                    Allergies  Allergen Reactions   Contrast Media [Iodinated Contrast Media] Other (See Comments)    Burning   Sulfa Antibiotics Nausea And Vomiting    Other reaction(s): GI Upset (intolerance) unknown   Doxepin Rash and Swelling   Tape Rash    Consultations: Oncology-Dr. Ellin Saba   Procedures/Studies: DG Chest Portable 1 View  Result Date:  06/01/2023 CLINICAL DATA:  67 year old female with history of chest pain. Multiple recent falls. EXAM: PORTABLE CHEST 1 VIEW COMPARISON:  Chest x-ray 02/21/2023. FINDINGS: Right internal jugular single-lumen Port-A-Cath with tip terminating in the distal superior vena cava. Lung volumes are very low. Elevation of the right hemidiaphragm is noted. No acute consolidative airspace disease. No pleural effusions. No pneumothorax. No evidence of pulmonary edema. Heart size is normal. Upper mediastinal contours are within normal limits. IMPRESSION: 1. Low lung volumes without radiographic evidence of acute cardiopulmonary disease. Electronically Signed   By: Trudie Reed M.D.   On: 06/01/2023 06:54   DG Knee Right Port  Result Date: 06/01/2023 CLINICAL DATA:  67 year old female with history of right-sided knee pain after a fall. EXAM: PORTABLE RIGHT KNEE - 1-2 VIEW COMPARISON:  No priors. FINDINGS: Four views of the right knee demonstrate no definite acute displaced fracture, subluxation or dislocation. Severe joint space narrowing, subchondral sclerosis, subchondral cyst formation and osteophyte formation is noted in a tricompartmental distribution, most severe in the patellofemoral compartment. Mild chondrocalcinosis is also noted most evident in the lateral compartment. IMPRESSION: 1. No acute radiographic abnormality of the right knee. 2. 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 3. Chondrocalcinosis. Electronically Signed   By: Trudie Reed M.D.   On: 06/01/2023 06:17   CT Cervical Spine Wo Contrast  Result Date: 06/01/2023 CLINICAL DATA:  67 year old female status post fall trying to get out of bed. Skin tears. EXAM: CT CERVICAL SPINE WITHOUT CONTRAST TECHNIQUE: Multidetector CT imaging of the cervical spine was performed without intravenous contrast. Multiplanar CT image reconstructions were also generated. RADIATION DOSE REDUCTION: This exam was performed  according to the departmental dose-optimization program which includes automated exposure control, adjustment of the mA and/or kV according to patient size and/or use of iterative reconstruction technique. COMPARISON:  Head CT today. FINDINGS: Alignment: Mild straightening of cervical lordosis. Cervicothoracic junction alignment is within normal limits. Bilateral posterior element alignment is within normal limits. Skull base and vertebrae: Visualized skull base is intact. No atlanto-occipital dissociation. C1 and C2 appear intact and aligned. No acute osseous abnormality identified. Soft tissues and spinal canal: No prevertebral fluid or swelling. No visible canal hematoma. Diminutive or absent thyroid. Otherwise negative visible noncontrast neck soft tissues. Disc levels: Very  mild for age cervical spine degeneration and capacious CT appearance of the cervical spinal canal. Upper chest: Right chest Port-A-Cath partially visible. Grossly intact visible upper thoracic levels. Grossly clear lung apices with motion artifact. IMPRESSION: No acute traumatic injury identified in the cervical spine. Electronically Signed   By: Odessa Fleming M.D.   On: 06/01/2023 05:59   CT Head Wo Contrast  Result Date: 06/01/2023 CLINICAL DATA:  67 year old female status post fall trying to get out of bed. Skin tears. EXAM: CT HEAD WITHOUT CONTRAST TECHNIQUE: Contiguous axial images were obtained from the base of the skull through the vertex without intravenous contrast. RADIATION DOSE REDUCTION: This exam was performed according to the departmental dose-optimization program which includes automated exposure control, adjustment of the mA and/or kV according to patient size and/or use of iterative reconstruction technique. COMPARISON:  Head CT 03/21/2013. FINDINGS: Brain: Generalized cerebral volume loss since 2014. No midline shift, ventriculomegaly, mass effect, evidence of mass lesion, intracranial hemorrhage or evidence of cortically  based acute infarction. Largely normal for age gray-white differentiation. Minimal to mild scattered white matter hypodensity. Vascular: No suspicious intracranial vascular hyperdensity. Calcified atherosclerosis at the skull base. Skull: No fracture identified. Sinuses/Orbits: Visualized paranasal sinuses and mastoids are clear. Other: No orbit or scalp soft tissue injury identified. IMPRESSION: 1. No acute intracranial abnormality or acute traumatic injury identified. 2. Mild for age white matter changes and generalized cerebral volume loss since 2014. Electronically Signed   By: Odessa Fleming M.D.   On: 06/01/2023 05:57     Discharge Exam: Vitals:   06/04/23 2126 06/05/23 0426  BP: (!) 97/55 (!) 91/57  Pulse: 88 87  Resp: 20 19  Temp: 99.1 F (37.3 C) 98.8 F (37.1 C)  SpO2: 97% 100%   Vitals:   06/04/23 0427 06/04/23 1434 06/04/23 2126 06/05/23 0426  BP: 90/62 90/67 (!) 97/55 (!) 91/57  Pulse: 95 75 88 87  Resp: 20 20 20 19   Temp: 98.4 F (36.9 C) 98.5 F (36.9 C) 99.1 F (37.3 C) 98.8 F (37.1 C)  TempSrc: Oral Oral Oral Oral  SpO2: 98% 100% 97% 100%  Weight:      Height:        General: Pt is alert, awake, not in acute distress Cardiovascular: RRR, S1/S2 +, no rubs, no gallops Respiratory: CTA bilaterally, no wheezing, no rhonchi Abdominal: Soft, NT, ND, bowel sounds + Extremities: no edema, no cyanosis    The results of significant diagnostics from this hospitalization (including imaging, microbiology, ancillary and laboratory) are listed below for reference.     Microbiology: No results found for this or any previous visit (from the past 240 hour(s)).   Labs: BNP (last 3 results) No results for input(s): "BNP" in the last 8760 hours. Basic Metabolic Panel: Recent Labs  Lab 06/01/23 0433 06/01/23 1056 06/02/23 0443 06/03/23 0449 06/04/23 0432 06/05/23 0717  NA 132* 131* 129* 128* 129* 130*  K 2.9* 3.5 3.4* 3.7 3.8 3.6  CL 99 101 99 99 99 100  CO2 21* 22 21*  23 23 24   GLUCOSE 113* 126* 105* 131* 117* 110*  BUN 9 9 11 13 11 10   CREATININE 1.17* 1.04* 1.01* 1.01* 0.99 0.91  CALCIUM 7.1* 6.8* 7.0* 7.2* 7.3* 7.3*  MG 1.3*  --   --   --   --  1.6*   Liver Function Tests: Recent Labs  Lab 06/01/23 0433 06/03/23 0449 06/04/23 0432  AST 33 30 29  ALT 13 15 14   ALKPHOS  133* 130* 120  BILITOT 1.1 1.3* 1.0  PROT 4.9* 4.9* 4.5*  ALBUMIN 1.9* 1.8* 1.6*   Recent Labs  Lab 06/01/23 0433  LIPASE 26   No results for input(s): "AMMONIA" in the last 168 hours. CBC: Recent Labs  Lab 06/01/23 0433 06/02/23 0443 06/03/23 0449 06/04/23 0432 06/05/23 0717  WBC 5.5 3.7* 4.0 2.8* 2.5*  HGB 10.7* 9.2* 9.5* 8.7* 9.0*  HCT 34.3* 29.8* 30.3* 27.7* 28.7*  MCV 100.6* 100.0 98.7 97.5 99.0  PLT 111* 79* 60* 50* 49*   Cardiac Enzymes: No results for input(s): "CKTOTAL", "CKMB", "CKMBINDEX", "TROPONINI" in the last 168 hours. BNP: Invalid input(s): "POCBNP" CBG: Recent Labs  Lab 06/04/23 0732 06/04/23 1201 06/04/23 1627 06/04/23 2129 06/05/23 0706  GLUCAP 107* 166* 162* 119* 152*   D-Dimer No results for input(s): "DDIMER" in the last 72 hours. Hgb A1c No results for input(s): "HGBA1C" in the last 72 hours. Lipid Profile No results for input(s): "CHOL", "HDL", "LDLCALC", "TRIG", "CHOLHDL", "LDLDIRECT" in the last 72 hours. Thyroid function studies No results for input(s): "TSH", "T4TOTAL", "T3FREE", "THYROIDAB" in the last 72 hours.  Invalid input(s): "FREET3" Anemia work up No results for input(s): "VITAMINB12", "FOLATE", "FERRITIN", "TIBC", "IRON", "RETICCTPCT" in the last 72 hours. Urinalysis    Component Value Date/Time   COLORURINE AMBER (A) 06/01/2023 1829   APPEARANCEUR CLOUDY (A) 06/01/2023 1829   LABSPEC 1.013 06/01/2023 1829   PHURINE 5.0 06/01/2023 1829   GLUCOSEU NEGATIVE 06/01/2023 1829   HGBUR SMALL (A) 06/01/2023 1829   BILIRUBINUR NEGATIVE 06/01/2023 1829   KETONESUR NEGATIVE 06/01/2023 1829   PROTEINUR 30 (A)  06/01/2023 1829   UROBILINOGEN 0.2 03/21/2013 0800   NITRITE NEGATIVE 06/01/2023 1829   LEUKOCYTESUR MODERATE (A) 06/01/2023 1829   Sepsis Labs Recent Labs  Lab 06/02/23 0443 06/03/23 0449 06/04/23 0432 06/05/23 0717  WBC 3.7* 4.0 2.8* 2.5*   Microbiology No results found for this or any previous visit (from the past 240 hour(s)).   Time coordinating discharge: 35 minutes  SIGNED:   Erick Blinks, DO [facility] 06/05/2023, 8:58 AM  If 7PM-7AM, please contact night-coverage [email or web address]

## 2023-06-06 LAB — CBC
HCT: 29.6 % — ABNORMAL LOW (ref 36.0–46.0)
Hemoglobin: 9.2 g/dL — ABNORMAL LOW (ref 12.0–15.0)
MCH: 30.9 pg (ref 26.0–34.0)
MCHC: 31.1 g/dL (ref 30.0–36.0)
MCV: 99.3 fL (ref 80.0–100.0)
Platelets: 59 10*3/uL — ABNORMAL LOW (ref 150–400)
RBC: 2.98 MIL/uL — ABNORMAL LOW (ref 3.87–5.11)
RDW: 17.3 % — ABNORMAL HIGH (ref 11.5–15.5)
WBC: 2.4 10*3/uL — ABNORMAL LOW (ref 4.0–10.5)
nRBC: 0 % (ref 0.0–0.2)

## 2023-06-06 LAB — BASIC METABOLIC PANEL
Anion gap: 7 (ref 5–15)
BUN: 8 mg/dL (ref 8–23)
CO2: 22 mmol/L (ref 22–32)
Calcium: 7.3 mg/dL — ABNORMAL LOW (ref 8.9–10.3)
Chloride: 102 mmol/L (ref 98–111)
Creatinine, Ser: 0.83 mg/dL (ref 0.44–1.00)
GFR, Estimated: >60 mL/min (ref >60)
Glucose, Bld: 112 mg/dL — ABNORMAL HIGH (ref 70–99)
Potassium: 3.4 mmol/L — ABNORMAL LOW (ref 3.5–5.1)
Sodium: 131 mmol/L — ABNORMAL LOW (ref 135–145)

## 2023-06-06 LAB — GLUCOSE, CAPILLARY
Glucose-Capillary: 96 mg/dL (ref 70–99)
Glucose-Capillary: 148 mg/dL — ABNORMAL HIGH (ref 70–99)
Glucose-Capillary: 130 mg/dL — ABNORMAL HIGH (ref 70–99)
Glucose-Capillary: 110 mg/dL — ABNORMAL HIGH (ref 70–99)

## 2023-06-06 LAB — MAGNESIUM: Magnesium: 1.6 mg/dL — ABNORMAL LOW (ref 1.7–2.4)

## 2023-06-06 MED ORDER — POTASSIUM CHLORIDE CRYS ER 20 MEQ PO TBCR
40.0000 meq | EXTENDED_RELEASE_TABLET | Freq: Once | ORAL | Status: AC
  Administered 2023-06-06: 40 meq via ORAL
  Filled 2023-06-06: qty 2

## 2023-06-06 NOTE — Progress Notes (Signed)
Patient seen and evaluated this morning with no new complaints or concerns noted.  She is currently up and having breakfast.  She continues to have some issues with urinary retention and required replacement of Foley catheter.  She will be discharged at this point with Foley catheter hopefully by 7/15.  Please refer to discharge summary dictated 7/13 for full details.  Total care time: 15 minutes.

## 2023-06-07 ENCOUNTER — Ambulatory Visit: Payer: Medicare HMO

## 2023-06-07 LAB — GLUCOSE, CAPILLARY
Glucose-Capillary: 96 mg/dL (ref 70–99)
Glucose-Capillary: 131 mg/dL — ABNORMAL HIGH (ref 70–99)

## 2023-06-07 NOTE — Progress Notes (Signed)
Patient seen and evaluated this morning with no new complaints or concerns noted.  She will discharge with Foley catheter and may have voiding trial performed outpatient for removal of catheter.  No other acute events or concerns noted.  Please refer to discharge summary dictated 7/13 for full details.  Total care time: 15 minutes.

## 2023-06-07 NOTE — TOC Transition Note (Signed)
Transition of Care [facility]) - CM/SW Discharge Note   Patient Details  Name: NANAKO STOPHER MRN: [id number] Date of Birth: [birth year]  [facility]) CM/SW Contact:  Leitha Bleak, RN Phone Number: 06/07/2023, 11:31 AM   Clinical Narrative:   Patient medically ready to discharge. Auth received for [facility], Daughter, April updated. RN called report. TOC scheduled EMS.   Final next level of care: Skilled Nursing Facility Barriers to Discharge: Barriers Resolved   Patient Goals and CMS Choice CMS Medicare.gov Compare Post Acute Care list provided to:: Patient Choice offered to / list presented to : Patient  Discharge Placement               Patient chooses bed at:  [facility]) Patient to be transferred to facility by: EMS Name of family member notified: Daughter Patient and family notified of of transfer: 06/07/23  Discharge Plan and Services Additional resources added to the After Visit Summary for   In-house Referral: Clinical Social Work Discharge Planning Services: CM Consult Post Acute Care Choice: Skilled Nursing Facility               Social Determinants of Health (SDOH) Interventions SDOH Screenings   Food Insecurity: No Food Insecurity (06/01/2023)  Housing: Low Risk  (06/01/2023)  Transportation Needs: No Transportation Needs (06/01/2023)  Utilities: Not At Risk (06/01/2023)  Depression (PHQ2-9): Low Risk  (01/06/2023)  Tobacco Use: Low Risk  (06/01/2023)    Readmission Risk Interventions    06/07/2023   11:28 AM  Readmission Risk Prevention Plan  Transportation Screening Complete  PCP or Specialist Appt within 3-5 Days Complete  HRI or Home Care Consult Complete  Social Work Consult for Recovery Care Planning/Counseling Complete  Palliative Care Screening Not Applicable  Medication Review Oceanographer) Complete

## 2023-06-07 NOTE — Care Management Important Message (Signed)
Important Message  Patient Details  Name: [name] MRN: [id number] Date of Birth: [birth year]   Medicare Important Message Given:  Yes     Corey Harold 06/07/2023, 11:01 AM

## 2023-06-08 ENCOUNTER — Ambulatory Visit: Payer: Medicare HMO

## 2023-06-08 ENCOUNTER — Ambulatory Visit: Payer: Medicare HMO | Admitting: Nurse Practitioner

## 2023-06-08 ENCOUNTER — Telehealth: Payer: Self-pay | Admitting: *Deleted

## 2023-06-08 DIAGNOSIS — E890 Postprocedural hypothyroidism: Secondary | ICD-10-CM

## 2023-06-08 DIAGNOSIS — E118 Type 2 diabetes mellitus with unspecified complications: Secondary | ICD-10-CM

## 2023-06-08 DIAGNOSIS — E782 Mixed hyperlipidemia: Secondary | ICD-10-CM

## 2023-06-08 DIAGNOSIS — I10 Essential (primary) hypertension: Secondary | ICD-10-CM

## 2023-06-08 DIAGNOSIS — E559 Vitamin D deficiency, unspecified: Secondary | ICD-10-CM

## 2023-06-08 NOTE — Progress Notes (Signed)
[facility] [address], [place] [address]    Clinic Day:  06/08/2023  Referring physician: Gwenlyn Found, MD  Patient Care Team: Gwenlyn Found, MD as PCP - General (Family Medicine) Jonelle Sidle, MD as PCP - Cardiology (Cardiology) Jena Gauss Gerrit Friends, MD as Consulting Physician (Gastroenterology) [name], MD as Medical Oncologist (Medical Oncology) Therese Sarah, RN as Oncology Nurse Navigator (Medical Oncology)   ASSESSMENT & PLAN:   Assessment: 1.  Stage IIc (T3d/4b N0 M0) rectal cancer: - Rectal bleeding for the past 4 to 5 years. - Colonoscopy (12/16/2022): Tumor protruding through the anal orifice.  Bulky semilunar lateral rectal tumor extending from the anorectal junction proximally about 13 cm. 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  MSI-high not detected by [id number] - CT CAP (12/17/2022): Locally advanced low rectal primary without bowel obstruction or nodal metastasis.  Isolated right middle lobe subpleural lung nodule most likely benign.  Cirrhosis and portal venous hypertension.  2.6 cm right renal lesion with differential hemorrhagic/proteinaceous cyst or solid neoplasm. - PET scan (12/31/2022): Markedly hypermetabolic rectal lesion with no evidence for perirectal or pelvic lymphadenopathy.  No metastatic disease in the neck, chest, abdomen or pelvis.  Focal hypermetabolism in the region of the gallbladder neck corresponds to subtle 13 mm focus of increased attenuation in the lumen of the gallbladder.  Differential includes gallbladder polyp/neoplasm.  2.7 cm exophytic posterior right interpolar renal lesion shows no substantial hypermetabolism, likely cyst.  2.3 cm inferior right thyroid nodule without hypermetabolism. - MRI pelvis (12/24/2022): Extension through muscularis propria.  Tumor size 3.8 x 3 x 5.8 cm with a volume 35 cm.  T stage is T3d, probable T4.   Suspicion of involvement of pelvic floor musculature and the far posterior and left side of the vagina. - 6 cycles of FOLFIRINOX from 01/20/2023 through 03/31/2023 - MRI pelvis (04/12/2023): Tumor size has improved.  T3c N0. - XRT started on 05/10/2023, Xeloda started on 05/19/2023   2.  Social/family history: - She lives at home with her family.  She is accompanied by her daughter today.  She is independent of ADLs and IADLs.  She is a retired Production designer, theatre/television/film at [employer].  Non-smoker.  She reports having hysterectomy in 1986 for cancerous polyps in her endometrium. - Maternal grandmother's sister had colon cancer.    Plan: 1.  Stage IIc (T3d/4BN0M0) rectal cancer, MSI stable: - She started Xeloda 1 g twice daily on 05/19/2023. - She reports diarrhea and decreased eating.  She is drinking 1 Ensure per day. - Reviewed labs today: Severely low electrolytes including potassium and magnesium.  Albumin is low at 2.3.  CBC grossly normal with mild thrombocytopenia. - Recommend holding Xeloda at this time.  If her diarrhea comes back to baseline, she may restart Xeloda 2 tablets twice daily on Monday.  RTC 1 week for follow-up.   2.  Peripheral neuropathy from diabetes: - She has difficulty feeling her toes.  No worsening since chemo started.   3.  Severe microcytic anemia: - Hemoglobin is stable at 11.5.  She has slight bleeding per rectum.   4.  Hypokalemia: - She is taking in liquid potassium 40 mEq 3 times daily.  Potassium today is 2.6.  We will replete in the clinic.  Will increase potassium to 4 times daily.   5.  Hypomagnesemia: - Continue magnesium 800 mg twice daily.  Magnesium will be repleted intravenously today.  6.  Diarrhea: - She started diarrhea Sunday with 1 watery stool, Monday 3 times and Tuesday 4-5 times.  She is taking Lomotil 2 tablets 4 times daily and Imodium as needed.    No orders of the defined types were placed in this encounter.      Alben Deeds  Teague,acting as a Neurosurgeon for [name], MD.,have documented all relevant documentation on the behalf of [name], MD,as directed by  [name], MD while in the presence of [name], MD.  ***   Nikolski R Teague   7/16/20248:23 PM  CHIEF COMPLAINT:   Diagnosis: stage IIc (T4b N0) low rectal cancer    Cancer Staging  Rectal cancer [facility]) Staging form: Colon and Rectum, AJCC 8th Edition - Clinical stage from 01/03/2023: Stage IIC (cT4b, cN0, cM0) - Unsigned    Prior Therapy: FOLFIRINOX   Current Therapy:  chemoradiation with Xeloda    HISTORY OF PRESENT ILLNESS:   Oncology History  Rectal cancer (HCC)  01/03/2023 Initial Diagnosis   Rectal cancer (HCC)   01/20/2023 - 04/02/2023 Chemotherapy   Patient is on Treatment Plan : RECTAL Modified FOLFIRINOX q14d x 8 cycles        INTERVAL HISTORY:   [name] is a 67 y.o. female presenting to clinic today for follow up of stage IIc (T4b N0) low rectal cancer. She was last seen by me on 05/26/23.  Since her last visit, she was admitted to the ED on 7/9 for a fall. She was noted to have ambulatory dysfunction with multiple falls and has been seen by PT with recommendations for SNF which has now been arranged. Patient was admitted for sepsis secondary to UTI in the setting of urinary obstruction and required Foley catheter placement. Her condition stabilized and she was discharged on 7/15.  Today, she states that she is doing well overall. Her appetite level is at ***%. Her energy level is at ***%.  PAST MEDICAL HISTORY:   Past Medical History: Past Medical History:  Diagnosis Date   Anxiety    Cancer (HCC)    Coronary atherosclerosis    Cardiac CT 09/2018 with calcium score 8.7 and mild proximal LAD disease   Essential hypertension 2009   GERD (gastroesophageal reflux disease)    Hypothyroidism    Iron deficiency anemia    Osteoarthritis    Palpitations    Tachypalpitations on beta blockers  since 2010   Type 2 diabetes mellitus (HCC) 2003   Vitamin B 12 deficiency    Vitamin D deficiency     Surgical History: Past Surgical History:  Procedure Laterality Date   ABDOMINAL HYSTERECTOMY     History of cervical cancer   BIOPSY  12/16/2022   Procedure: BIOPSY;  Surgeon: Corbin Ade, MD;  [facility];  Service: Endoscopy;;  gastric, rectal   BREAST BIOPSY Right    COLONOSCOPY WITH PROPOFOL N/A 12/16/2022   Procedure: COLONOSCOPY WITH PROPOFOL;  Surgeon: Corbin Ade, MD;  [facility]: [facility];  Service: Endoscopy;  Laterality: N/A;  10:45 am   ESOPHAGOGASTRODUODENOSCOPY (EGD) WITH PROPOFOL N/A 12/16/2022   Procedure: ESOPHAGOGASTRODUODENOSCOPY (EGD) WITH PROPOFOL;  Surgeon: Corbin Ade, MD;  [facility];  Service: Endoscopy;  Laterality: N/A;   IR IMAGING GUIDED PORT INSERTION  01/15/2023   MALONEY DILATION N/A 12/16/2022   Procedure: Elease Hashimoto DILATION;  Surgeon: Corbin Ade, MD;  [facility];  Service: Endoscopy;  Laterality: N/A;   POLYPECTOMY  12/16/2022   Procedure:  POLYPECTOMY;  Surgeon: Corbin Ade, MD;  [facility];  Service: Endoscopy;;   TONSILLECTOMY      Social History: Social History   Socioeconomic History   Marital status: Single    Spouse name: Not on file   Number of children: 2   Years of education: Not on file   Highest education level: Not on file  Occupational History   Occupation: DISABLED    Employer: UNEMPLOYED    Comment: [employer]  Tobacco Use   Smoking status: Never   Smokeless tobacco: Never  Vaping Use   Vaping status: Never Used  Substance and Sexual Activity   Alcohol use: No   Drug use: No   Sexual activity: Never  Other Topics Concern   Not on file  Social History Narrative   Has 2 grandchildren. 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   Social Determinants of Health   Financial Resource Strain: Not on file  Food Insecurity: No Food  Insecurity (06/01/2023)   Hunger Vital Sign    Worried About Running Out of Food in the Last Year: Never true    Ran Out of Food in the Last Year: Never true  Transportation Needs: No Transportation Needs (06/01/2023)   PRAPARE - Administrator, Civil Service (Medical): No    Lack of Transportation (Non-Medical): No  Physical Activity: Not on file  Stress: Not on file  Social Connections: Not on file  Intimate Partner Violence: Not At Risk (06/01/2023)   Humiliation, Afraid, Rape, and Kick questionnaire    Fear of Current or Ex-Partner: No    Emotionally Abused: No    Physically Abused: No    Sexually Abused: No    Family History: Family History  Problem Relation Age of Onset   Heart failure Mother    Diabetes Father    Hypertension Father    Stroke Sister 78   Diabetes Brother    Cancer - Colon Other        age 66   Colon polyps Neg Hx     Current Medications:  Current Outpatient Medications:    blood glucose meter kit and supplies KIT, Dispense based on patient and insurance preference. Use up to four times daily as directed. (FOR ICD-10 E11.65) Number of Strips 400 Number of Lancets 400 (Patient not taking: Reported on 06/01/2023), Disp: 1 each, Rfl: 0   Blood Glucose Monitoring Suppl (TRUE METRIX METER) w/Device KIT, 1 each by Does not apply route 2 (two) times daily. Use to test BG bid E11.65 (Patient not taking: Reported on 06/01/2023), Disp: 1 kit, Rfl: 0   diphenoxylate-atropine (LOMOTIL) 2.5-0.025 MG tablet, Take 1 tablet by mouth 4 (four) times daily. (Patient taking differently: Take 2 tablets by mouth 4 (four) times daily.), Disp: 360 tablet, Rfl: 3   Feeding Tubes (FEEDING TUBE ATTACHMENT DEVICE) MISC, 3 in 1 Bedside commode, Disp: , Rfl:    FLUOROURACIL IV, Inject into the vein every 14 (fourteen) days. (Patient not taking: Reported on 06/01/2023), Disp: , Rfl:    HYDROcodone-acetaminophen (NORCO/VICODIN) 5-325 MG tablet, Take 1 tablet by mouth every 6 (six) hours  as needed for moderate pain., Disp: 10 tablet, Rfl: 0   insulin aspart (NOVOLOG FLEXPEN) 100 UNIT/ML FlexPen, INJECT 0 TO 6 UNITS UNDER SKIN THREE TIMES DAILY WITH MEALS (DISCARD PEN 28 DAYS AFTER OPENING) (Patient not taking: Reported on 06/01/2023), Disp: 15 mL, Rfl: 0   insulin glargine (LANTUS SOLOSTAR) 100 UNIT/ML Solostar Pen,  INJECT 40 UNITS UNDER THE SKIN AT BEDTIME (Patient not taking: Reported on 06/01/2023), Disp: 45 mL, Rfl: 0   Insulin Pen Needle (DROPLET PEN NEEDLES) 31G X 5 MM MISC, USE AS INSTRUCTED TO INJECT INSULIN FOUR TIMES DAILY (Patient not taking: Reported on 06/01/2023), Disp: 100 each, Rfl: 3   IRINOTECAN HCL IV, Inject into the vein every 14 (fourteen) days. (Patient not taking: Reported on 06/01/2023), Disp: , Rfl:    LEUCOVORIN CALCIUM IV, Inject into the vein every 14 (fourteen) days. (Patient not taking: Reported on 06/01/2023), Disp: , Rfl:    levothyroxine (SYNTHROID) 88 MCG tablet, TAKE 1 TABLET EVERY DAY BEFORE BREAKFAST (Patient taking differently: Take 88 mcg by mouth daily before breakfast.), Disp: 90 tablet, Rfl: 3   loperamide (IMODIUM) 2 MG capsule, Take 1 capsule (2 mg total) by mouth as needed for diarrhea or loose stools. TAKE 1 CAPSULE AS DIRECTED AS NEEDED FOR DIARRHEA OR LOOSE STOOLS, Disp: 30 capsule, Rfl: 11   magnesium oxide (MAG-OX) 400 (240 Mg) MG tablet, Take 2 tablets (800 mg total) by mouth daily., Disp: 120 tablet, Rfl: 3   megestrol (MEGACE) 400 MG/10ML suspension, Take 10 mLs (400 mg total) by mouth 2 (two) times daily., Disp: 240 mL, Rfl: 0   Misc. Devices (ROLLATOR ULTRA-LIGHT) MISC, Rollator walker, Disp: , Rfl:    ondansetron (ZOFRAN-ODT) 4 MG disintegrating tablet, Take 1 tablet (4 mg total) by mouth every 8 (eight) hours as needed for nausea or vomiting. DISSOLVE 2 TAB ON TONGUE EVERY 8HR AS NEEDED NAUSEA, VOMITING (BETWEEN COMPAZINE DOSES FOR MAX RELIEF) Strength: 4 mg, Disp: 60 tablet, Rfl: 1   OXALIPLATIN IV, Inject into the vein every 14 (fourteen)  days. (Patient not taking: Reported on 06/01/2023), Disp: , Rfl:    pantoprazole (PROTONIX) 40 MG tablet, Take 1 tablet (40 mg total) by mouth 2 (two) times daily before a meal., Disp: 60 tablet, Rfl: 3   Potassium Chloride 40 MEQ/15ML (20%) SOLN, Take 15 mLs by mouth 3 (three) times daily., Disp: 473 mL, Rfl: 0   prochlorperazine (COMPAZINE) 10 MG tablet, Take 1 tablet (10 mg total) by mouth every 8 (eight) hours as needed for nausea or vomiting. TAKE 1 TABLET EVERY 6 HOURS AS NEEDED FOR NAUSEA OR VOMITING Strength: 10 mg, Disp: 30 tablet, Rfl: 3   QUEtiapine (SEROQUEL) 100 MG tablet, Take 1 tablet (100 mg total) by mouth at bedtime., Disp: 20 tablet, Rfl: 0   TRUE METRIX BLOOD GLUCOSE TEST test strip, TEST BLOOD SUGAR TWICE DAILY, Disp: 200 strip, Rfl: 3 No current facility-administered medications for this visit.  Facility-Administered Medications Ordered in Other Visits:    0.9 %  sodium chloride infusion, , Intravenous, Continuous, [name], MD, Stopped at 02/17/23 1535   Allergies: Allergies  Allergen Reactions   Contrast Media [Iodinated Contrast Media] Other (See Comments)    Burning   Sulfa Antibiotics Nausea And Vomiting    Other reaction(s): GI Upset (intolerance) unknown   Doxepin Rash and Swelling   Tape Rash    REVIEW OF SYSTEMS:   Review of Systems  Constitutional:  Negative for chills, fatigue and fever.  HENT:   Negative for lump/mass, mouth sores, nosebleeds, sore throat and trouble swallowing.   Eyes:  Negative for eye problems.  Respiratory:  Negative for cough and shortness of breath.   Cardiovascular:  Negative for chest pain, leg swelling and palpitations.  Gastrointestinal:  Negative for abdominal pain, constipation, diarrhea, nausea and vomiting.  Genitourinary:  Negative for bladder  incontinence, difficulty urinating, dysuria, frequency, hematuria and nocturia.   Musculoskeletal:  Negative for arthralgias, back pain, flank pain, myalgias and neck  pain.  Skin:  Negative for itching and rash.  Neurological:  Negative for dizziness, headaches and numbness.  Hematological:  Does not bruise/bleed easily.  Psychiatric/Behavioral:  Negative for depression, sleep disturbance and suicidal ideas. The patient is not nervous/anxious.   All other systems reviewed and are negative.    VITALS:   There were no vitals taken for this visit.  Wt Readings from Last 3 Encounters:  06/07/23 256 lb 9.9 oz (116.4 kg)  05/19/23 237 lb 6.4 oz (107.7 kg)  05/13/23 229 lb (103.9 kg)    There is no height or weight on file to calculate BMI.  Performance status (ECOG): 2 - Symptomatic, <50% confined to bed  PHYSICAL EXAM:   Physical Exam Vitals and nursing note reviewed. Exam conducted with a chaperone present.  Constitutional:      Appearance: Normal appearance.  Cardiovascular:     Rate and Rhythm: Normal rate and regular rhythm.     Pulses: Normal pulses.     Heart sounds: Normal heart sounds.  Pulmonary:     Effort: Pulmonary effort is normal.     Breath sounds: Normal breath sounds.  Abdominal:     Palpations: Abdomen is soft. There is no hepatomegaly, splenomegaly or mass.     Tenderness: There is no abdominal tenderness.  Musculoskeletal:     Right lower leg: No edema.     Left lower leg: No edema.  Lymphadenopathy:     Cervical: No cervical adenopathy.     Right cervical: No superficial, deep or posterior cervical adenopathy.    Left cervical: No superficial, deep or posterior cervical adenopathy.     Upper Body:     Right upper body: No supraclavicular or axillary adenopathy.     Left upper body: No supraclavicular or axillary adenopathy.  Neurological:     General: No focal deficit present.     Mental Status: She is alert and oriented to person, place, and time.  Psychiatric:        Mood and Affect: Mood normal.        Behavior: Behavior normal.     LABS:      Latest Ref Rng & Units 06/06/2023    4:12 AM 06/05/2023     7:17 AM 06/04/2023    4:32 AM  CBC  WBC 4.0 - 10.5 K/uL 2.4  2.5  2.8   Hemoglobin 12.0 - 15.0 g/dL 9.2  9.0  8.7   Hematocrit 36.0 - 46.0 % 29.6  28.7  27.7   Platelets 150 - 400 K/uL 59  49  50       Latest Ref Rng & Units 06/06/2023    4:12 AM 06/05/2023    7:17 AM 06/04/2023    4:32 AM  CMP  Glucose 70 - 99 mg/dL 027  253  664   BUN 8 - 23 mg/dL 8  10  11    Creatinine 0.44 - 1.00 mg/dL 4.03  4.74  2.59   Sodium 135 - 145 mmol/L 131  130  129   Potassium 3.5 - 5.1 mmol/L 3.4  3.6  3.8   Chloride 98 - 111 mmol/L 102  100  99   CO2 22 - 32 mmol/L 22  24  23    Calcium 8.9 - 10.3 mg/dL 7.3  7.3  7.3   Total Protein 6.5 - 8.1 g/dL  4.5   Total Bilirubin 0.3 - 1.2 mg/dL   1.0   Alkaline Phos 38 - 126 U/L   120   AST 15 - 41 U/L   29   ALT 0 - 44 U/L   14      Lab Results  Component Value Date   CEA1 3.8 12/16/2022   /  CEA  Date Value Ref Range Status  12/16/2022 3.8 0.0 - 4.7 ng/mL Final    Comment:    (NOTE)                             Nonsmokers          <3.9                             Smokers             <5.6 [employer] Electrochemiluminescence Immunoassay (ECLIA) Values obtained with different assay methods or kits cannot be used interchangeably.  Results cannot be interpreted as absolute evidence of the presence or absence of malignant disease. Performed At: [facility] [address] [place], [place] [id number] Jolene Schimke MD WU:[id number]    No results found for: "PSA1" No results found for: "CAN199" No results found for: "CAN125"  No results found for: "TOTALPROTELP", "ALBUMINELP", "A1GS", "A2GS", "BETS", "BETA2SER", "GAMS", "MSPIKE", "SPEI" Lab Results  Component Value Date   TIBC 157 (L) 04/09/2023   TIBC 370 01/04/2023   FERRITIN 2,274 (H) 04/09/2023   FERRITIN 4 (L) 01/04/2023   IRONPCTSAT 57 (H) 04/09/2023   IRONPCTSAT 4 (L) 01/04/2023   Lab Results  Component Value Date   LDH 136 01/04/2023     STUDIES:   DG Chest  Portable 1 View  Result Date: 06/01/2023 CLINICAL DATA:  67 year old female with history of chest pain. Multiple recent falls. EXAM: PORTABLE CHEST 1 VIEW COMPARISON:  Chest x-ray 02/21/2023. FINDINGS: Right internal jugular single-lumen Port-A-Cath with tip terminating in the distal superior vena cava. Lung volumes are very low. Elevation of the right hemidiaphragm is noted. No acute consolidative airspace disease. No pleural effusions. No pneumothorax. No evidence of pulmonary edema. Heart size is normal. Upper mediastinal contours are within normal limits. IMPRESSION: 1. Low lung volumes without radiographic evidence of acute cardiopulmonary disease. Electronically Signed   By: Trudie Reed M.D.   On: 06/01/2023 06:54   DG Knee Right Port  Result Date: 06/01/2023 CLINICAL DATA:  67 year old female with history of right-sided knee pain after a fall. EXAM: PORTABLE RIGHT KNEE - 1-2 VIEW COMPARISON:  No priors. FINDINGS: Four views of the right knee demonstrate no definite acute displaced fracture, subluxation or dislocation. Severe joint space narrowing, subchondral sclerosis, subchondral cyst formation and osteophyte formation is noted in a tricompartmental distribution, most severe in the patellofemoral compartment. Mild chondrocalcinosis is also noted most evident in the lateral compartment. IMPRESSION: 1. No acute radiographic abnormality of the right knee. 2. 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 3. Chondrocalcinosis. Electronically Signed   By: Trudie Reed M.D.   On: 06/01/2023 06:17   CT Cervical Spine Wo Contrast  Result Date: 06/01/2023 CLINICAL DATA:  67 year old female status post fall trying to get out of bed. Skin tears. EXAM: CT CERVICAL SPINE WITHOUT CONTRAST TECHNIQUE: Multidetector CT imaging of the cervical spine was performed without intravenous contrast. Multiplanar CT image reconstructions were also generated. RADIATION DOSE  REDUCTION: This  exam was performed according to the departmental dose-optimization program which includes automated exposure control, adjustment of the mA and/or kV according to patient size and/or use of iterative reconstruction technique. COMPARISON:  Head CT today. FINDINGS: Alignment: Mild straightening of cervical lordosis. Cervicothoracic junction alignment is within normal limits. Bilateral posterior element alignment is within normal limits. Skull base and vertebrae: Visualized skull base is intact. No atlanto-occipital dissociation. C1 and C2 appear intact and aligned. No acute osseous abnormality identified. Soft tissues and spinal canal: No prevertebral fluid or swelling. No visible canal hematoma. Diminutive or absent thyroid. Otherwise negative visible noncontrast neck soft tissues. Disc levels: Very mild for age cervical spine degeneration and capacious CT appearance of the cervical spinal canal. Upper chest: Right chest Port-A-Cath partially visible. Grossly intact visible upper thoracic levels. Grossly clear lung apices with motion artifact. IMPRESSION: No acute traumatic injury identified in the cervical spine. Electronically Signed   By: Odessa Fleming M.D.   On: 06/01/2023 05:59   CT Head Wo Contrast  Result Date: 06/01/2023 CLINICAL DATA:  67 year old female status post fall trying to get out of bed. Skin tears. EXAM: CT HEAD WITHOUT CONTRAST TECHNIQUE: Contiguous axial images were obtained from the base of the skull through the vertex without intravenous contrast. RADIATION DOSE REDUCTION: This exam was performed according to the departmental dose-optimization program which includes automated exposure control, adjustment of the mA and/or kV according to patient size and/or use of iterative reconstruction technique. COMPARISON:  Head CT 03/21/2013. FINDINGS: Brain: Generalized cerebral volume loss since 2014. No midline shift, ventriculomegaly, mass effect, evidence of mass lesion, intracranial  hemorrhage or evidence of cortically based acute infarction. Largely normal for age gray-white differentiation. Minimal to mild scattered white matter hypodensity. Vascular: No suspicious intracranial vascular hyperdensity. Calcified atherosclerosis at the skull base. Skull: No fracture identified. Sinuses/Orbits: Visualized paranasal sinuses and mastoids are clear. Other: No orbit or scalp soft tissue injury identified. IMPRESSION: 1. No acute intracranial abnormality or acute traumatic injury identified. 2. Mild for age white matter changes and generalized cerebral volume loss since 2014. Electronically Signed   By: Odessa Fleming M.D.   On: 06/01/2023 05:57

## 2023-06-08 NOTE — Telephone Encounter (Signed)
Spoke with the patient to see how she was doing since her discharge from the hospital yesterday.  She states she was discharged to a facility.   She was informed that we really would like her to continue with her treatments to receive the full benefits from the treatments.  She states she just feels so weak and wants to focus on regaining her strength before she continues with treatment if she continues at all.  She states she will see Dr. Ellin Saba tomorrow and will discuss with him her next steps.  I let her know that we would cancel her treatment today and would reassess tomorrow after she is seen by Dr. Ellin Saba.  She verbalized understanding and was appreciative of the call.  Lind Covert RN, BSN

## 2023-06-09 ENCOUNTER — Inpatient Hospital Stay: Payer: Medicare HMO

## 2023-06-09 ENCOUNTER — Inpatient Hospital Stay: Payer: Medicare HMO | Admitting: Hematology

## 2023-06-09 ENCOUNTER — Ambulatory Visit: Payer: Medicare HMO

## 2023-06-09 DIAGNOSIS — C20 Malignant neoplasm of rectum: Secondary | ICD-10-CM

## 2023-06-09 DIAGNOSIS — E876 Hypokalemia: Secondary | ICD-10-CM | POA: Diagnosis present

## 2023-06-09 DIAGNOSIS — R197 Diarrhea, unspecified: Secondary | ICD-10-CM | POA: Diagnosis not present

## 2023-06-09 DIAGNOSIS — D509 Iron deficiency anemia, unspecified: Secondary | ICD-10-CM

## 2023-06-09 LAB — CBC WITH DIFFERENTIAL/PLATELET
Abs Immature Granulocytes: 0.09 10*3/uL — ABNORMAL HIGH (ref 0.00–0.07)
Basophils Absolute: 0 10*3/uL (ref 0.0–0.1)
Basophils Relative: 1 %
Eosinophils Absolute: 0.1 10*3/uL (ref 0.0–0.5)
Eosinophils Relative: 2 %
HCT: 31.5 % — ABNORMAL LOW (ref 36.0–46.0)
Hemoglobin: 9.9 g/dL — ABNORMAL LOW (ref 12.0–15.0)
Immature Granulocytes: 3 %
Lymphocytes Relative: 20 %
Lymphs Abs: 0.7 10*3/uL (ref 0.7–4.0)
MCH: 30.7 pg (ref 26.0–34.0)
MCHC: 31.4 g/dL (ref 30.0–36.0)
MCV: 97.8 fL (ref 80.0–100.0)
Monocytes Absolute: 0.2 10*3/uL (ref 0.1–1.0)
Monocytes Relative: 6 %
Neutro Abs: 2.3 10*3/uL (ref 1.7–7.7)
Neutrophils Relative %: 68 %
Platelets: 111 10*3/uL — ABNORMAL LOW (ref 150–400)
RBC: 3.22 MIL/uL — ABNORMAL LOW (ref 3.87–5.11)
RDW: 17.3 % — ABNORMAL HIGH (ref 11.5–15.5)
WBC: 3.3 10*3/uL — ABNORMAL LOW (ref 4.0–10.5)
nRBC: 0 % (ref 0.0–0.2)

## 2023-06-09 LAB — COMPREHENSIVE METABOLIC PANEL
ALT: 13 U/L (ref 0–44)
AST: 32 U/L (ref 15–41)
Albumin: 1.8 g/dL — ABNORMAL LOW (ref 3.5–5.0)
Alkaline Phosphatase: 148 U/L — ABNORMAL HIGH (ref 38–126)
Anion gap: 6 (ref 5–15)
BUN: 9 mg/dL (ref 8–23)
CO2: 23 mmol/L (ref 22–32)
Calcium: 7.7 mg/dL — ABNORMAL LOW (ref 8.9–10.3)
Chloride: 102 mmol/L (ref 98–111)
Creatinine, Ser: 0.92 mg/dL (ref 0.44–1.00)
GFR, Estimated: >60 mL/min (ref >60)
Glucose, Bld: 112 mg/dL — ABNORMAL HIGH (ref 70–99)
Potassium: 3.7 mmol/L (ref 3.5–5.1)
Sodium: 131 mmol/L — ABNORMAL LOW (ref 135–145)
Total Bilirubin: 0.9 mg/dL (ref 0.3–1.2)
Total Protein: 5.1 g/dL — ABNORMAL LOW (ref 6.5–8.1)

## 2023-06-09 LAB — SAMPLE TO BLOOD BANK

## 2023-06-09 LAB — MAGNESIUM: Magnesium: 1.6 mg/dL — ABNORMAL LOW (ref 1.7–2.4)

## 2023-06-09 MED ORDER — HEPARIN SOD (PORK) LOCK FLUSH 100 UNIT/ML IV SOLN
500.0000 [IU] | Freq: Once | INTRAVENOUS | Status: AC | PRN
  Administered 2023-06-09: 500 [IU]

## 2023-06-09 MED ORDER — SODIUM CHLORIDE 0.9% FLUSH
10.0000 mL | Freq: Once | INTRAVENOUS | Status: AC | PRN
  Administered 2023-06-09: 10 mL

## 2023-06-09 MED ORDER — POTASSIUM CHLORIDE IN NACL 20-0.9 MEQ/L-% IV SOLN
Freq: Once | INTRAVENOUS | Status: AC
  Filled 2023-06-09: qty 1000

## 2023-06-09 MED ORDER — MAGNESIUM SULFATE 2 GM/50ML IV SOLN
2.0000 g | Freq: Once | INTRAVENOUS | Status: AC
  Administered 2023-06-09: 2 g via INTRAVENOUS
  Filled 2023-06-09: qty 50

## 2023-06-09 NOTE — Patient Instructions (Addendum)
[facility] at [facility] Discharge Instructions   You were seen and examined today by Dr. Ellin Saba.  He reviewed the results of your lab work which are mostly normal/stable. Your magnesium is slightly low at 1.6. We will give you IV fluids with potassium and magnesium today.   Do not restart Xeloda.   You may resume radiation (if you choose to do so) once you are feeling stronger.   We will see you back in 4 weeks. We will repeat lab work and give you possible fluids at that time.    Thank you for choosing [facility] at [facility] to provide your oncology and hematology care.  To afford each patient quality time with our provider, please arrive at least 15 minutes before your scheduled appointment time.   If you have a lab appointment with the Cancer Center please come in thru the Main Entrance and check in at the main information desk.  You need to re-schedule your appointment should you arrive 10 or more minutes late.  We strive to give you quality time with our providers, and arriving late affects you and other patients whose appointments are after yours.  Also, if you no show three or more times for appointments you may be dismissed from the clinic at the providers discretion.     Again, thank you for choosing [facility].  Our hope is that these requests will decrease the amount of time that you wait before being seen by our physicians.       _____________________________________________________________  Should you have questions after your visit to [facility], please contact our office at [phone] and follow the prompts.  Our office hours are 8:00 a.m. and 4:30 p.m. Monday - Friday.  Please note that voicemails left after 4:00 p.m. may not be returned until the following business day.  We are closed weekends and major holidays.  You do have access to a nurse 24-7, just call the main number to the clinic  [phone] and do not press any options, hold on the line and a nurse will answer the phone.    For prescription refill requests, have your pharmacy contact our office and allow 72 hours.    Due to Covid, you will need to wear a mask upon entering the hospital. If you do not have a mask, a mask will be given to you at the Main Entrance upon arrival. For doctor visits, patients may have 1 support person age 72 or older with them. For treatment visits, patients can not have anyone with them due to social distancing guidelines and our immunocompromised population.

## 2023-06-09 NOTE — Progress Notes (Signed)
Patient presents today for possible IVF.  Patient is in satisfactory condition with no new complaints voiced.  Vital signs are stable. Labs reviewed.  Magnesium today is 1.6.  All other labs are stable.     Patient had a recent fall at SNF.  She has multiple skin tears on bilateral arms.  One of the skin tears is located on her R elbow.  When transporting patient from wheelchair to recliner the skin tear on her R elbow reopened and bled slightly.  Patient did not c/o pain or seem in distress.  The skin tear was dressed with nonadherent pads and a transparent dressing.  SNF notified via paperwork to address this and dress it properly per their protocol.   We will give house IVF over 2 hours today per Dr. Ellin Saba.  Patient tolerated IVF well with no complaints voiced.  Patient left via wheelchair with grandson to SNF in stable condition.  Vital signs stable at discharge.  Follow up as scheduled.

## 2023-06-09 NOTE — Patient Instructions (Signed)

## 2023-06-10 ENCOUNTER — Other Ambulatory Visit: Payer: Self-pay | Admitting: *Deleted

## 2023-06-10 ENCOUNTER — Ambulatory Visit: Payer: Medicare HMO

## 2023-06-10 ENCOUNTER — Inpatient Hospital Stay: Payer: Medicare HMO | Admitting: Dietician

## 2023-06-10 ENCOUNTER — Telehealth: Payer: Self-pay | Admitting: Dietician

## 2023-06-10 DIAGNOSIS — G893 Neoplasm related pain (acute) (chronic): Secondary | ICD-10-CM

## 2023-06-10 NOTE — Telephone Encounter (Signed)
Patient scheduled for nutrition follow-up via telephone. Chart reviewed. Patient discharged to SNF s/p 7/9-7/15 hospital admission with weakness. RD attempted to contact patient on cell number listed. She did not answer. Unable to leave message. Will continue to follow and reschedule nutrition appointment as able.

## 2023-06-11 ENCOUNTER — Ambulatory Visit: Payer: Medicare HMO

## 2023-06-14 ENCOUNTER — Ambulatory Visit: Payer: Medicare HMO

## 2023-06-15 ENCOUNTER — Ambulatory Visit: Payer: Medicare HMO

## 2023-06-15 ENCOUNTER — Telehealth: Payer: Self-pay | Admitting: Radiation Oncology

## 2023-06-15 NOTE — Telephone Encounter (Signed)
  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      Indication for treatment: curative       Radiation treatment dates:   05/10/23-05/28/23  Site/dose:   The pelvis was treated to 25.2 Gy in 14 treatments though her prescription was for 50.4 Gy to the pelvis including 45 Gy in 25 fractions, and a 5.4 Gy boost in 3 fractions.   Narrative: The patient tolerated radiation treatment until developing a UTI and urosepsis. She had diarrhea during chemoRT and became debilitated from this. She was recently hospitalized and discontinued treatment with xeloda. She has not been for therapy in our department for the last 2 1/2 weeks due to being in a facility.  Plan: The patient has decided to discontinued therapy. While we would recommend she try to complete this we will cancel her remaining therapy at her request. She will follow up with Dr. Ellin Saba and Dr. Maisie Fus in [facility].      Osker Mason, PAC

## 2023-06-15 NOTE — Telephone Encounter (Signed)
I called and could not reach the patient by phone so I called her daughter April. She was able to patch me into a conference call with both she and Ms. Schuknecht. The patient completed 14/28 fractions to the pelvis for her distal rectal cancer, her last treatment being on 05/28/23. While she was struggling and had been hospitalized around that time for urosepsis, and had significant diarrhea, she is now in a facility trying to recover with rehabilitation. She is hopeful now to move forward with surgery, but she is also aware that the incomplete course of treatment will likely affect what type and if she is eligible for surgery. We will cancel her remaining radiation treatments at her request. She was encouraged to call if she has questions or concerns regarding her radiation.

## 2023-06-16 ENCOUNTER — Ambulatory Visit: Payer: Medicare HMO

## 2023-06-16 ENCOUNTER — Inpatient Hospital Stay: Payer: Medicare HMO | Admitting: Hematology

## 2023-06-16 ENCOUNTER — Inpatient Hospital Stay: Payer: Medicare HMO

## 2023-06-17 ENCOUNTER — Ambulatory Visit: Payer: Medicare HMO

## 2023-06-17 ENCOUNTER — Encounter: Payer: Self-pay | Admitting: Hematology

## 2023-06-18 ENCOUNTER — Ambulatory Visit: Payer: Medicare HMO

## 2023-06-21 ENCOUNTER — Ambulatory Visit: Payer: Medicare HMO

## 2023-06-22 ENCOUNTER — Ambulatory Visit: Payer: Medicare HMO

## 2023-06-23 ENCOUNTER — Ambulatory Visit: Payer: Medicare HMO

## 2023-06-23 ENCOUNTER — Inpatient Hospital Stay: Payer: Medicare HMO

## 2023-06-23 ENCOUNTER — Inpatient Hospital Stay: Payer: Medicare HMO | Admitting: Hematology

## 2023-06-24 ENCOUNTER — Ambulatory Visit: Payer: Medicare HMO

## 2023-06-24 ENCOUNTER — Other Ambulatory Visit: Payer: Self-pay

## 2023-06-24 MED ORDER — DICYCLOMINE HCL 10 MG PO CAPS: 10.0000 mg | ORAL_CAPSULE | Freq: Three times a day (TID) | ORAL | 1 refills | Status: DC | PRN

## 2023-06-25 ENCOUNTER — Ambulatory Visit: Payer: Medicare HMO

## 2023-06-25 NOTE — Radiation Completion Notes (Signed)
Patient Name: [name], [name] MRN: [id number] Date of Birth: May 07, 1956 Referring Physician: [name], M.D. Date of Service: 2023-06-25 Radiation Oncologist: Dorothy Puffer, M.D. [facility]                             RADIATION ONCOLOGY END OF TREATMENT NOTE     Diagnosis: C20 Malignant neoplasm of rectum Staging on 2023-01-03: Rectal cancer (HCC) T=cT4b, N=cN0, M=cM0 Intent: Curative     ==========DELIVERED PLANS==========  First Treatment Date: 2023-05-07 - Last Treatment Date: 2023-05-28   Plan Name: Pelvis Site: Pelvis Technique: IMRT Mode: Photon Dose Per Fraction: 1.8 Gy Prescribed Dose (Delivered / Prescribed): 25.2 Gy / 45 Gy Prescribed Fxs (Delivered / Prescribed): 14 / 25     ==========ON TREATMENT VISIT DATES========== 2023-05-14, 2023-05-21, 2023-05-28     ==========UPCOMING VISITS==========       ==========APPENDIX - ON TREATMENT VISIT NOTES==========   See weekly On Treatment Notes in Epic for details.

## 2023-06-28 ENCOUNTER — Ambulatory Visit: Payer: Medicare HMO

## 2023-06-28 ENCOUNTER — Encounter: Payer: Self-pay | Admitting: Hematology

## 2023-06-29 ENCOUNTER — Ambulatory Visit: Payer: Medicare HMO

## 2023-06-30 ENCOUNTER — Ambulatory Visit: Payer: Medicare HMO

## 2023-07-01 ENCOUNTER — Ambulatory Visit: Payer: Medicare HMO

## 2023-07-02 ENCOUNTER — Ambulatory Visit: Payer: Medicare HMO

## 2023-07-07 ENCOUNTER — Inpatient Hospital Stay: Payer: Medicare HMO

## 2023-07-07 ENCOUNTER — Inpatient Hospital Stay: Payer: Medicare HMO | Admitting: Hematology

## 2023-07-08 ENCOUNTER — Inpatient Hospital Stay: Payer: Medicare HMO

## 2023-07-08 ENCOUNTER — Inpatient Hospital Stay: Payer: Medicare HMO | Attending: Hematology | Admitting: Dietician

## 2023-07-08 ENCOUNTER — Telehealth: Payer: Self-pay | Admitting: Dietician

## 2023-07-08 ENCOUNTER — Inpatient Hospital Stay: Payer: Medicare HMO | Admitting: Dietician

## 2023-07-08 DIAGNOSIS — Z800 Family history of malignant neoplasm of digestive organs: Secondary | ICD-10-CM | POA: Insufficient documentation

## 2023-07-08 DIAGNOSIS — E1142 Type 2 diabetes mellitus with diabetic polyneuropathy: Secondary | ICD-10-CM | POA: Insufficient documentation

## 2023-07-08 DIAGNOSIS — C20 Malignant neoplasm of rectum: Secondary | ICD-10-CM | POA: Insufficient documentation

## 2023-07-08 DIAGNOSIS — E876 Hypokalemia: Secondary | ICD-10-CM | POA: Insufficient documentation

## 2023-07-08 DIAGNOSIS — R197 Diarrhea, unspecified: Secondary | ICD-10-CM | POA: Insufficient documentation

## 2023-07-08 DIAGNOSIS — R109 Unspecified abdominal pain: Secondary | ICD-10-CM | POA: Insufficient documentation

## 2023-07-08 NOTE — Telephone Encounter (Signed)
Nutrition Follow-up:   Patient has completed chemotherapy for colon cancer. She is currently receiving radiation under the care of Dr. Mitzi Hansen (last radiation 7/5) Xeloda discontinued due to poor tolerance  7/9-7/15 hospital admission with weakness - discharged to [facility] for rehab.  Spoke with patient via telephone. Pt discharge from facility 8/14. Pt glad to finally be home. States she did not eat the first 2 weeks secondary to "horrible" facility food. Recalls drinking milk only. Appetite began to improve the last few weeks of stay. Her children brought in food she was craving (hotdogs, hamburger). Pt reports tolerating these foods. She endorses intermittent nausea. She is not taking antiemetics as this goes away quickly. Pt endorses 3-4 bowel movements daily. Stools are soft. Pt had mac/cheese for dinner last night. She is currently drinking 2% milk 3-4 (12 oz) glasses and ~20 ounces water. Pt is not drinking oral nutrition supplements. Pt reports she will continue working with PT at home. Facility is setting this up.   Medications: reviewed  Labs: 7/17 labs - Na 131, albumin 1.8  Anthropometrics: Pt unsure of current wt. Last wt 256 lb 9.9 oz on 7/15    NUTRITION DIAGNOSIS: Food and nutrition related knowledge deficit continues    MALNUTRITION DIAGNOSIS: Severe malnutrition ongoing   INTERVENTION:  Encouraged small frequent meals/snacks  Include protein source at every meal Pt is not consuming ONS as she does not like these - Continue drinking milk (suggested switching to whole milk for added calories if tolerated) Recommend high protein snack following PT    MONITORING, EVALUATION, GOAL: wt trends, intake   NEXT VISIT: To be scheduled with treatment plan

## 2023-07-19 ENCOUNTER — Other Ambulatory Visit: Payer: Self-pay | Admitting: *Deleted

## 2023-07-19 DIAGNOSIS — G893 Neoplasm related pain (acute) (chronic): Secondary | ICD-10-CM

## 2023-07-19 MED ORDER — HYDROCODONE-ACETAMINOPHEN 5-325 MG PO TABS
1.0000 | ORAL_TABLET | Freq: Four times a day (QID) | ORAL | 0 refills | Status: DC | PRN
Start: 2023-07-19 — End: 2023-10-11

## 2023-07-19 NOTE — Progress Notes (Incomplete)
[facility] [address] [place] [address]    Clinic Day:  07/19/23   Referring physician: Gwenlyn Found, MD  Patient Care Team: Gwenlyn Found, MD as PCP - General (Family Medicine) Jonelle Sidle, MD as PCP - Cardiology (Cardiology) Jena Gauss Gerrit Friends, MD as Consulting Physician (Gastroenterology) [name], MD as Medical Oncologist (Medical Oncology) Therese Sarah, RN as Oncology Nurse Navigator (Medical Oncology)   ASSESSMENT & PLAN:   Assessment: 1.  Stage IIc (T3d/4b N0 M0) rectal cancer: - Rectal bleeding for the past 4 to 5 years. - Colonoscopy (12/16/2022): Tumor protruding through the anal orifice.  Bulky semilunar lateral rectal tumor extending from the anorectal junction proximally about 13 cm. 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  MSI-high not detected by [id number] - CT CAP (12/17/2022): Locally advanced low rectal primary without bowel obstruction or nodal metastasis.  Isolated right middle lobe subpleural lung nodule most likely benign.  Cirrhosis and portal venous hypertension.  2.6 cm right renal lesion with differential hemorrhagic/proteinaceous cyst or solid neoplasm. - PET scan (12/31/2022): Markedly hypermetabolic rectal lesion with no evidence for perirectal or pelvic lymphadenopathy.  No metastatic disease in the neck, chest, abdomen or pelvis.  Focal hypermetabolism in the region of the gallbladder neck corresponds to subtle 13 mm focus of increased attenuation in the lumen of the gallbladder.  Differential includes gallbladder polyp/neoplasm.  2.7 cm exophytic posterior right interpolar renal lesion shows no substantial hypermetabolism, likely cyst.  2.3 cm inferior right thyroid nodule without hypermetabolism. - MRI pelvis (12/24/2022): Extension through muscularis propria.  Tumor size 3.8 x 3 x 5.8 cm with a volume 35 cm.  T stage is T3d, probable T4.   Suspicion of involvement of pelvic floor musculature and the far posterior and left side of the vagina. - 6 cycles of FOLFIRINOX from 01/20/2023 through 03/31/2023 - MRI pelvis (04/12/2023): Tumor size has improved.  T3c N0. - XRT started on 05/10/2023, Xeloda started on 05/19/2023   2.  Social/family history: - She lives at home with her family.  She is accompanied by her daughter today.  She is independent of ADLs and IADLs.  She is a retired Production designer, theatre/television/film at [employer].  Non-smoker.  She reports having hysterectomy in 1986 for cancerous polyps in her endometrium. - Maternal grandmother's sister had colon cancer.    Plan: 1.  Stage IIc (T3d/4BN0M0) rectal cancer, MSI stable: - She was last seen by me on 05/26/2023 when I held her Xeloda. - She was admitted to the hospital with weakness from 06/01/2023 through 06/07/2023 and is currently at [facility] rehab center. - She also reportedly fell x 3 on Monday as her legs gave out. - Her last radiation was on 05/28/2023. - I have recommended that she completely discontinue Xeloda and she may proceed with radiation and completed. - She reports that she cannot do any treatment at this time as she is feeling very weak.  She will continue rehab at [facility]. - I will reach out to Dr. Maisie Fus to see if she can proceed with surgery once her nutritional status and her performance status improves. - Labs today indicate albumin is low at 1.8.  I have recommended that she start drinking Ensure high-protein twice daily with food.   2.  Peripheral neuropathy from diabetes: - She has difficulty feeling her toes from neuropathy.  No worsening since chemo started.   3.  Severe microcytic  anemia: - Hemoglobin is 9.9 today.  No bleeding per rectum reported.   4.  Hypokalemia: - Continue potassium 40 mEq 4 times daily.  Potassium is normal today.   5.  Hypomagnesemia: - Continue magnesium 2 tablets twice daily.  She will receive IV magnesium today.   6.   Diarrhea: - She reports diarrhea is better.  Use Lomotil and Imodium as needed.    No orders of the defined types were placed in this encounter.     Alben Deeds Teague,acting as a Neurosurgeon for [name], MD.,have documented all relevant documentation on the behalf of [name], MD,as directed by  [name], MD while in the presence of [name], MD.  ***    [place] R Teague   8/26/20249:02 PM  CHIEF COMPLAINT:   Diagnosis: stage IIc (T4b N0) low rectal cancer    Cancer Staging  Rectal cancer [facility]) Staging form: Colon and Rectum, AJCC 8th Edition - Clinical stage from 01/03/2023: Stage IIC (cT4b, cN0, cM0) - Unsigned    Prior Therapy: FOLFIRINOX   Current Therapy:  chemoradiation with Xeloda    HISTORY OF PRESENT ILLNESS:   Oncology History  Rectal cancer (HCC)  01/03/2023 Initial Diagnosis   Rectal cancer (HCC)   01/20/2023 - 04/02/2023 Chemotherapy   Patient is on Treatment Plan : RECTAL Modified FOLFIRINOX q14d x 8 cycles        INTERVAL HISTORY:   [name] is a 67 y.o. female presenting to clinic today for follow up of stage IIc (T4b N0) low rectal cancer. She was last seen by me on 06/09/23.  Her appetite level is at ***%. Her energy level is at ***%. She is accompanied by her daughter.    PAST MEDICAL HISTORY:   Past Medical History: Past Medical History:  Diagnosis Date   Anxiety    Cancer (HCC)    Coronary atherosclerosis    Cardiac CT 09/2018 with calcium score 8.7 and mild proximal LAD disease   Essential hypertension 2009   GERD (gastroesophageal reflux disease)    Hypothyroidism    Iron deficiency anemia    Osteoarthritis    Palpitations    Tachypalpitations on beta blockers since 2010   Type 2 diabetes mellitus (HCC) 2003   Vitamin B 12 deficiency    Vitamin D deficiency     Surgical History: Past Surgical History:  Procedure Laterality Date   ABDOMINAL HYSTERECTOMY     History of cervical cancer    BIOPSY  12/16/2022   Procedure: BIOPSY;  Surgeon: Corbin Ade, MD;  [facility];  Service: Endoscopy;;  gastric, rectal   BREAST BIOPSY Right    COLONOSCOPY WITH PROPOFOL N/A 12/16/2022   Procedure: COLONOSCOPY WITH PROPOFOL;  Surgeon: Corbin Ade, MD;  [facility];  Service: Endoscopy;  Laterality: N/A;  10:45 am   ESOPHAGOGASTRODUODENOSCOPY (EGD) WITH PROPOFOL N/A 12/16/2022   Procedure: ESOPHAGOGASTRODUODENOSCOPY (EGD) WITH PROPOFOL;  Surgeon: Corbin Ade, MD;  [facility];  Service: Endoscopy;  Laterality: N/A;   IR IMAGING GUIDED PORT INSERTION  01/15/2023   MALONEY DILATION N/A 12/16/2022   Procedure: Elease Hashimoto DILATION;  Surgeon: Corbin Ade, MD;  [facility]: [facility];  Service: Endoscopy;  Laterality: N/A;   POLYPECTOMY  12/16/2022   Procedure: POLYPECTOMY;  Surgeon: Corbin Ade, MD;  [facility];  Service: Endoscopy;;   TONSILLECTOMY      Social History: Social History   Socioeconomic History   Marital status: Single  Spouse name: Not on file   Number of children: 2   Years of education: Not on file   Highest education level: Not on file  Occupational History   Occupation: DISABLED    Employer: UNEMPLOYED    Comment: O[employer]OAR[employer]  Tobacco Use   Smoking status: Never   Smokeless tobacco: Never  Vaping Use   Vaping status: Never Used  Substance and Sexual Activity   Alcohol use: No   Drug use: No   Sexual activity: Never  Other Topics Concern   Not on file  Social History Narrative   Has 2 grandchildren. 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 : Never true  Transportation Needs: Not on file (07/13/2023)  Recent Concern: Transportation Needs - Unmet Transportation Needs (07/09/2023)   Received from [facility]    In the past 12 months, has lack of reliable transportation kept you from medical appointments, meetings, work or from getting things needed for daily living? : Yes  Physical Activity: Not on file  Stress: Not on file  Social Connections: Not on file  Intimate Partner Violence: Not At Risk (06/01/2023)   Humiliation, Afraid, Rape, and Kick questionnaire    Fear of Current or Ex-Partner: No    Emotionally Abused: No    Physically Abused: No    Sexually Abused: No    Family History: Family History  Problem Relation Age of Onset   Heart failure Mother    Diabetes Father    Hypertension Father    Stroke Sister 64   Diabetes Brother    Cancer - Colon Other        age 67   Colon polyps Neg Hx     Current Medications:  Current Outpatient Medications:    blood glucose meter kit and supplies KIT, Dispense based on patient and insurance preference. Use up to four times daily as directed. (FOR ICD-10 E11.65) Number of Strips 400 Number of Lancets 400, Disp: 1 each, Rfl: 0   Blood Glucose Monitoring Suppl (TRUE METRIX METER) w/Device KIT, 1 each by Does not apply route 2 (two) times daily. Use to test BG bid E11.65, Disp: 1 kit, Rfl: 0   dicyclomine (BENTYL) 10 MG capsule, Take 1 capsule (10 mg total) by mouth every 8 (eight) hours as needed for spasms., Disp: 90 capsule, Rfl: 1   diphenoxylate-atropine (LOMOTIL) 2.5-0.025 MG tablet, Take 1 tablet by mouth 4 (four) times daily. (Patient taking differently: Take 2 tablets by mouth 4 (four) times daily.), Disp: 360 tablet, Rfl: 3   Feeding Tubes (FEEDING TUBE ATTACHMENT DEVICE) MISC, 3 in 1 Bedside commode, Disp: , Rfl:    FLUOROURACIL IV, Inject into the vein every 14 (fourteen) days., Disp: , Rfl:     HYDROcodone-acetaminophen (NORCO/VICODIN) 5-325 MG tablet, Take 1 tablet by mouth every 6 (six) hours as needed for moderate pain., Disp: 10 tablet, Rfl: 0   insulin aspart (NOVOLOG FLEXPEN) 100 UNIT/ML FlexPen, INJECT 0 TO 6 UNITS UNDER SKIN THREE TIMES DAILY WITH  MEALS (DISCARD PEN 28 DAYS AFTER OPENING), Disp: 15 mL, Rfl: 0   insulin glargine (LANTUS SOLOSTAR) 100 UNIT/ML Solostar Pen, INJECT 40 UNITS UNDER THE SKIN AT BEDTIME, Disp: 45 mL, Rfl: 0   Insulin Pen Needle (DROPLET PEN NEEDLES) 31G X 5 MM MISC, USE AS INSTRUCTED TO INJECT INSULIN FOUR TIMES DAILY, Disp: 100 each, Rfl: 3   IRINOTECAN HCL IV, Inject into the vein every 14 (fourteen) days., Disp: , Rfl:    LEUCOVORIN CALCIUM IV, Inject into the vein every 14 (fourteen) days., Disp: , Rfl:    levothyroxine (SYNTHROID) 88 MCG tablet, TAKE 1 TABLET EVERY DAY BEFORE BREAKFAST (Patient taking differently: Take 88 mcg by mouth daily before breakfast.), Disp: 90 tablet, Rfl: 3   loperamide (IMODIUM) 2 MG capsule, Take 1 capsule (2 mg total) by mouth as needed for diarrhea or loose stools. TAKE 1 CAPSULE AS DIRECTED AS NEEDED FOR DIARRHEA OR LOOSE STOOLS, Disp: 30 capsule, Rfl: 11   magnesium oxide (MAG-OX) 400 (240 Mg) MG tablet, Take 2 tablets (800 mg total) by mouth daily., Disp: 120 tablet, Rfl: 3   megestrol (MEGACE) 400 MG/10ML suspension, Take 10 mLs (400 mg total) by mouth 2 (two) times daily., Disp: 240 mL, Rfl: 0   Misc. Devices (ROLLATOR ULTRA-LIGHT) MISC, Rollator walker, Disp: , Rfl:    ondansetron (ZOFRAN-ODT) 4 MG disintegrating tablet, Take 1 tablet (4 mg total) by mouth every 8 (eight) hours as needed for nausea or vomiting. DISSOLVE 2 TAB ON TONGUE EVERY 8HR AS NEEDED NAUSEA, VOMITING (BETWEEN COMPAZINE DOSES FOR MAX RELIEF) Strength: 4 mg, Disp: 60 tablet, Rfl: 1   OXALIPLATIN IV, Inject into the vein every 14 (fourteen) days., Disp: , Rfl:    pantoprazole (PROTONIX) 40 MG tablet, Take 1 tablet (40 mg total) by mouth 2 (two) times  daily before a meal., Disp: 60 tablet, Rfl: 3   Potassium Chloride 40 MEQ/15ML (20%) SOLN, Take 15 mLs by mouth 3 (three) times daily., Disp: 473 mL, Rfl: 0   prochlorperazine (COMPAZINE) 10 MG tablet, Take 1 tablet (10 mg total) by mouth every 8 (eight) hours as needed for nausea or vomiting. TAKE 1 TABLET EVERY 6 HOURS AS NEEDED FOR NAUSEA OR VOMITING Strength: 10 mg, Disp: 30 tablet, Rfl: 3   QUEtiapine (SEROQUEL) 100 MG tablet, Take 1 tablet (100 mg total) by mouth at bedtime., Disp: 20 tablet, Rfl: 0   TRUE METRIX BLOOD GLUCOSE TEST test strip, TEST BLOOD SUGAR TWICE DAILY, Disp: 200 strip, Rfl: 3 No current facility-administered medications for this visit.  Facility-Administered Medications Ordered in Other Visits:    0.9 %  sodium chloride infusion, , Intravenous, Continuous, [name], MD, Stopped at 02/17/23 1535   Allergies: Allergies  Allergen Reactions   Contrast Media [Iodinated Contrast Media] Other (See Comments)    Burning   Sulfa Antibiotics Nausea And Vomiting    Other reaction(s): GI Upset (intolerance) unknown   Doxepin Rash and Swelling   Tape Rash    REVIEW OF SYSTEMS:   Review of Systems  Constitutional:  Negative for chills, fatigue and fever.  HENT:   Negative for lump/mass, mouth sores, nosebleeds, sore throat and trouble swallowing.   Eyes:  Negative for eye problems.  Respiratory:  Negative for cough and shortness of breath.   Cardiovascular:  Negative for chest pain, leg swelling and palpitations.  Gastrointestinal:  Negative for abdominal pain, constipation, diarrhea, nausea and vomiting.  Genitourinary:  Negative for bladder incontinence, difficulty urinating, dysuria, frequency, hematuria and nocturia.  Musculoskeletal:  Negative for arthralgias, back pain, flank pain, myalgias and neck pain.  Skin:  Negative for itching and rash.  Neurological:  Negative for dizziness, headaches and numbness.  Hematological:  Does not bruise/bleed  easily.  Psychiatric/Behavioral:  Negative for depression, sleep disturbance and suicidal ideas. The patient is not nervous/anxious.   All other systems reviewed and are negative.    VITALS:   There were no vitals taken for this visit.  Wt Readings from Last 3 Encounters:  06/07/23 256 lb 9.9 oz (116.4 kg)  05/19/23 237 lb 6.4 oz (107.7 kg)  05/13/23 229 lb (103.9 kg)    There is no height or weight on file to calculate BMI.  Performance status (ECOG): 2 - Symptomatic, <50% confined to bed  PHYSICAL EXAM:   Physical Exam Vitals and nursing note reviewed. Exam conducted with a chaperone present.  Constitutional:      Appearance: Normal appearance.  Cardiovascular:     Rate and Rhythm: Normal rate and regular rhythm.     Pulses: Normal pulses.     Heart sounds: Normal heart sounds.  Pulmonary:     Effort: Pulmonary effort is normal.     Breath sounds: Normal breath sounds.  Abdominal:     Palpations: Abdomen is soft. There is no hepatomegaly, splenomegaly or mass.     Tenderness: There is no abdominal tenderness.  Musculoskeletal:     Right lower leg: No edema.     Left lower leg: No edema.  Lymphadenopathy:     Cervical: No cervical adenopathy.     Right cervical: No superficial, deep or posterior cervical adenopathy.    Left cervical: No superficial, deep or posterior cervical adenopathy.     Upper Body:     Right upper body: No supraclavicular or axillary adenopathy.     Left upper body: No supraclavicular or axillary adenopathy.  Neurological:     General: No focal deficit present.     Mental Status: She is alert and oriented to person, place, and time.  Psychiatric:        Mood and Affect: Mood normal.        Behavior: Behavior normal.     LABS:      Latest Ref Rng & Units 06/09/2023   10:17 AM 06/06/2023    4:12 AM 06/05/2023    7:17 AM  CBC  WBC 4.0 - 10.5 K/uL 3.3  2.4  2.5   Hemoglobin 12.0 - 15.0 g/dL 9.9  9.2  9.0   Hematocrit 36.0 - 46.0 % 31.5   29.6  28.7   Platelets 150 - 400 K/uL 111  59  49       Latest Ref Rng & Units 06/09/2023   10:17 AM 06/06/2023    4:12 AM 06/05/2023    7:17 AM  CMP  Glucose 70 - 99 mg/dL 427  062  376   BUN 8 - 23 mg/dL 9  8  10    Creatinine 0.44 - 1.00 mg/dL 2.83  1.51  7.61   Sodium 135 - 145 mmol/L 131  131  130   Potassium 3.5 - 5.1 mmol/L 3.7  3.4  3.6   Chloride 98 - 111 mmol/L 102  102  100   CO2 22 - 32 mmol/L 23  22  24    Calcium 8.9 - 10.3 mg/dL 7.7  7.3  7.3   Total Protein 6.5 - 8.1 g/dL 5.1     Total Bilirubin 0.3 - 1.2 mg/dL 0.9  Alkaline Phos 38 - 126 U/L 148     AST 15 - 41 U/L 32     ALT 0 - 44 U/L 13        Lab Results  Component Value Date   CEA1 3.8 12/16/2022   /  CEA  Date Value Ref Range Status  12/16/2022 3.8 0.0 - 4.7 ng/mL Final    Comment:    (NOTE)                             Nonsmokers          <3.9                             Smokers             <5.6 Roche [employer] Electrochemiluminescence Immunoassay (ECLIA) Values obtained with different assay methods or kits cannot be used interchangeably.  Results cannot be interpreted as absolute evidence of the presence or absence of malignant disease. Performed At: [facility] [address] [place], [place] [id number] Jolene Schimke MD WU:[id number]    No results found for: "PSA1" No results found for: "CAN199" No results found for: "CAN125"  No results found for: "TOTALPROTELP", "ALBUMINELP", "A1GS", "A2GS", "BETS", "BETA2SER", "GAMS", "MSPIKE", "SPEI" Lab Results  Component Value Date   TIBC 157 (L) 04/09/2023   TIBC 370 01/04/2023   FERRITIN 2,274 (H) 04/09/2023   FERRITIN 4 (L) 01/04/2023   IRONPCTSAT 57 (H) 04/09/2023   IRONPCTSAT 4 (L) 01/04/2023   Lab Results  Component Value Date   LDH 136 01/04/2023     STUDIES:   No results found.

## 2023-07-20 ENCOUNTER — Inpatient Hospital Stay: Payer: Medicare HMO

## 2023-07-20 ENCOUNTER — Inpatient Hospital Stay: Payer: Medicare HMO | Admitting: Hematology

## 2023-07-21 ENCOUNTER — Inpatient Hospital Stay (HOSPITAL_BASED_OUTPATIENT_CLINIC_OR_DEPARTMENT_OTHER): Payer: Medicare HMO | Admitting: Hematology

## 2023-07-21 ENCOUNTER — Inpatient Hospital Stay: Payer: Medicare HMO

## 2023-07-21 ENCOUNTER — Other Ambulatory Visit: Payer: Self-pay | Admitting: *Deleted

## 2023-07-21 VITALS — BP 104/68 | HR 113 | Temp 98.3°F | Resp 19

## 2023-07-21 DIAGNOSIS — D509 Iron deficiency anemia, unspecified: Secondary | ICD-10-CM | POA: Diagnosis not present

## 2023-07-21 DIAGNOSIS — C20 Malignant neoplasm of rectum: Secondary | ICD-10-CM | POA: Diagnosis present

## 2023-07-21 DIAGNOSIS — R197 Diarrhea, unspecified: Secondary | ICD-10-CM | POA: Diagnosis not present

## 2023-07-21 DIAGNOSIS — E1142 Type 2 diabetes mellitus with diabetic polyneuropathy: Secondary | ICD-10-CM | POA: Diagnosis not present

## 2023-07-21 DIAGNOSIS — Z800 Family history of malignant neoplasm of digestive organs: Secondary | ICD-10-CM | POA: Diagnosis not present

## 2023-07-21 DIAGNOSIS — Z95828 Presence of other vascular implants and grafts: Secondary | ICD-10-CM

## 2023-07-21 DIAGNOSIS — E876 Hypokalemia: Secondary | ICD-10-CM | POA: Diagnosis not present

## 2023-07-21 DIAGNOSIS — R109 Unspecified abdominal pain: Secondary | ICD-10-CM | POA: Diagnosis not present

## 2023-07-21 LAB — COMPREHENSIVE METABOLIC PANEL
ALT: 15 U/L (ref 0–44)
AST: 40 U/L (ref 15–41)
Albumin: 2.4 g/dL — ABNORMAL LOW (ref 3.5–5.0)
Alkaline Phosphatase: 57 U/L (ref 38–126)
Anion gap: 7 (ref 5–15)
BUN: 14 mg/dL (ref 8–23)
CO2: 22 mmol/L (ref 22–32)
Calcium: 7.4 mg/dL — ABNORMAL LOW (ref 8.9–10.3)
Chloride: 107 mmol/L (ref 98–111)
Creatinine, Ser: 0.71 mg/dL (ref 0.44–1.00)
GFR, Estimated: >60 mL/min (ref >60)
Glucose, Bld: 122 mg/dL — ABNORMAL HIGH (ref 70–99)
Potassium: 3.3 mmol/L — ABNORMAL LOW (ref 3.5–5.1)
Sodium: 136 mmol/L (ref 135–145)
Total Bilirubin: 0.5 mg/dL (ref 0.3–1.2)
Total Protein: 5.6 g/dL — ABNORMAL LOW (ref 6.5–8.1)

## 2023-07-21 LAB — CBC WITH DIFFERENTIAL/PLATELET
Abs Immature Granulocytes: 0.03 10*3/uL (ref 0.00–0.07)
Basophils Absolute: 0 10*3/uL (ref 0.0–0.1)
Basophils Relative: 1 %
Eosinophils Absolute: 0.1 10*3/uL (ref 0.0–0.5)
Eosinophils Relative: 3 %
HCT: 30.8 % — ABNORMAL LOW (ref 36.0–46.0)
Hemoglobin: 9.7 g/dL — ABNORMAL LOW (ref 12.0–15.0)
Immature Granulocytes: 1 %
Lymphocytes Relative: 18 %
Lymphs Abs: 0.6 10*3/uL — ABNORMAL LOW (ref 0.7–4.0)
MCH: 31.1 pg (ref 26.0–34.0)
MCHC: 31.5 g/dL (ref 30.0–36.0)
MCV: 98.7 fL (ref 80.0–100.0)
Monocytes Absolute: 0.3 10*3/uL (ref 0.1–1.0)
Monocytes Relative: 8 %
Neutro Abs: 2.3 10*3/uL (ref 1.7–7.7)
Neutrophils Relative %: 69 %
Platelets: 121 10*3/uL — ABNORMAL LOW (ref 150–400)
RBC: 3.12 MIL/uL — ABNORMAL LOW (ref 3.87–5.11)
RDW: 16.7 % — ABNORMAL HIGH (ref 11.5–15.5)
WBC: 3.4 10*3/uL — ABNORMAL LOW (ref 4.0–10.5)
nRBC: 0 % (ref 0.0–0.2)

## 2023-07-21 LAB — MAGNESIUM: Magnesium: 1.1 mg/dL — ABNORMAL LOW (ref 1.7–2.4)

## 2023-07-21 MED ORDER — HEPARIN SOD (PORK) LOCK FLUSH 100 UNIT/ML IV SOLN
500.0000 [IU] | Freq: Once | INTRAVENOUS | Status: AC
  Administered 2023-07-21: 500 [IU] via INTRAVENOUS

## 2023-07-21 MED ORDER — OXYCODONE HCL 5 MG PO TABS: 5.0000 mg | ORAL_TABLET | Freq: Two times a day (BID) | ORAL | 0 refills | Status: DC | PRN

## 2023-07-21 MED ORDER — OXYCODONE HCL 5 MG PO TABS
5.0000 mg | ORAL_TABLET | Freq: Two times a day (BID) | ORAL | 0 refills | Status: DC | PRN
Start: 2023-07-21 — End: 2023-07-21

## 2023-07-21 MED ORDER — PREDNISONE 50 MG PO TABS: ORAL_TABLET | ORAL | 0 refills | Status: DC

## 2023-07-21 MED ORDER — SODIUM CHLORIDE 0.9% FLUSH
10.0000 mL | INTRAVENOUS | Status: DC | PRN
  Administered 2023-07-21: 10 mL via INTRAVENOUS

## 2023-07-21 NOTE — Progress Notes (Signed)
Patient presents today for port flush and labs.  Patient is in satisfactory condition with no new complaints voiced.  Vital signs are stable.  Patient c/o pain/tenderness above port.  RN noticed some edema and redness.  Area was not warm to the touch.  Dr. Anders Simmonds was made aware. MD evaluated the area.  Ok to proceed per MD.   Patients port flushed without difficulty.  Good blood return noted with no bruising or swelling noted at site.  Patient remains accessed.

## 2023-07-21 NOTE — Patient Instructions (Addendum)
[facility] at [facility] Discharge Instructions   You were seen and examined today by Dr. Ellin Saba.  He reviewed the results of your lab work which are mostly normal/stable. Your magnesium is very low at 1.1. You need to restart taking your magnesium supplements. We will recheck your magnesium level next week and will give you IV magnesium at that time if you still need it since you are unable to make it back tomorrow or Friday to receive the IV magnesium.  We will make a referral to Dr. Maisie Fus for her to evaluate you for surgery.   We will repeat a CT scan prior to your next office visit.   Return as scheduled.    Thank you for choosing [facility] at [facility] to provide your oncology and hematology care.  To afford each patient quality time with our provider, please arrive at least 15 minutes before your scheduled appointment time.   If you have a lab appointment with the Cancer Center please come in thru the Main Entrance and check in at the main information desk.  You need to re-schedule your appointment should you arrive 10 or more minutes late.  We strive to give you quality time with our providers, and arriving late affects you and other patients whose appointments are after yours.  Also, if you no show three or more times for appointments you may be dismissed from the clinic at the providers discretion.     Again, thank you for choosing [facility].  Our hope is that these requests will decrease the amount of time that you wait before being seen by our physicians.       _____________________________________________________________  Should you have questions after your visit to [facility], please contact our office at [phone] and follow the prompts.  Our office hours are 8:00 a.m. and 4:30 p.m. Monday - Friday.  Please note that voicemails left after 4:00 p.m. may not be returned until the following  business day.  We are closed weekends and major holidays.  You do have access to a nurse 24-7, just call the main number to the clinic [phone] and do not press any options, hold on the line and a nurse will answer the phone.    For prescription refill requests, have your pharmacy contact our office and allow 72 hours.    Due to Covid, you will need to wear a mask upon entering the hospital. If you do not have a mask, a mask will be given to you at the Main Entrance upon arrival. For doctor visits, patients may have 1 support person age 54 or older with them. For treatment visits, patients can not have anyone with them due to social distancing guidelines and our immunocompromised population.

## 2023-07-21 NOTE — Progress Notes (Signed)
[facility] [address] [place] [address]    Clinic Day:  07/21/23   Referring physician: Gwenlyn Found, MD  Patient Care Team: Gwenlyn Found, MD as PCP - General (Family Medicine) Jonelle Sidle, MD as PCP - Cardiology (Cardiology) Jena Gauss Gerrit Friends, MD as Consulting Physician (Gastroenterology) [name], MD as Medical Oncologist (Medical Oncology) Therese Sarah, RN as Oncology Nurse Navigator (Medical Oncology)   ASSESSMENT & PLAN:   Assessment: 1.  Stage IIc (T3d/4b N0 M0) rectal cancer: - Rectal bleeding for the past 4 to 5 years. - Colonoscopy (12/16/2022): Tumor protruding through the anal orifice.  Bulky semilunar lateral rectal tumor extending from the anorectal junction proximally about 13 cm. 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  MSI-high not detected by [id number] - CT CAP (12/17/2022): Locally advanced low rectal primary without bowel obstruction or nodal metastasis.  Isolated right middle lobe subpleural lung nodule most likely benign.  Cirrhosis and portal venous hypertension.  2.6 cm right renal lesion with differential hemorrhagic/proteinaceous cyst or solid neoplasm. - PET scan (12/31/2022): Markedly hypermetabolic rectal lesion with no evidence for perirectal or pelvic lymphadenopathy.  No metastatic disease in the neck, chest, abdomen or pelvis.  Focal hypermetabolism in the region of the gallbladder neck corresponds to subtle 13 mm focus of increased attenuation in the lumen of the gallbladder.  Differential includes gallbladder polyp/neoplasm.  2.7 cm exophytic posterior right interpolar renal lesion shows no substantial hypermetabolism, likely cyst.  2.3 cm inferior right thyroid nodule without hypermetabolism. - MRI pelvis (12/24/2022): Extension through muscularis propria.  Tumor size 3.8 x 3 x 5.8 cm with a volume 35 cm.  T stage is T3d, probable T4.   Suspicion of involvement of pelvic floor musculature and the far posterior and left side of the vagina. - 6 cycles of FOLFIRINOX from 01/20/2023 through 03/31/2023 - MRI pelvis (04/12/2023): Tumor size has improved.  T3c N0. - XRT started on 05/10/2023, Xeloda started on 05/19/2023, Xeloda held on 05/26/2023.  Last radiation on 05/28/2023. - XRT and Xeloda discontinued due to poor tolerance.   2.  Social/family history: - She lives at home with her family.  She is accompanied by her daughter today.  She is independent of ADLs and IADLs.  She is a retired Production designer, theatre/television/film at [employer].  Non-smoker.  She reports having hysterectomy in 1986 for cancerous polyps in her endometrium. - Maternal grandmother's sister had colon cancer.    Plan: 1.  Stage IIc (T3d/4BN0M0) rectal cancer, MSI stable: - She was discharged home from rehab center on 07/07/2023. - She is doing physical therapy twice a week.  She tried walking yesterday and took about 7-8 steps with help.  Overall she is improving day by day.  Eating a lot better. - Labs today: Normal LFTs.  Albumin improved to 2.4.  CBC shows mild cytopenias which is improving. - We will reach out to Dr. Maisie Fus to see if she is a candidate for surgery. - Will do MRI of the pelvis if Dr. Maisie Fus requests.  Otherwise I will see her back in 2 months for follow-up with CTAP and labs.   2.  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   3.  Abdominal pain: - Continue oxycodone 5 mg twice daily as needed.   4.  Hypokalemia: - Stop taking potassium.  Potassium today 3.3.   5.  Hypomagnesemia: - Stopped taking magnesium.  Magnesium is 1.1.  She was offered IV magnesium.  She could not stay today. - She was told to start magnesium 2 tablets twice daily.  We will check magnesium next week.  If it is low she will receive IV magnesium and we will adjust the outpatient medication dose.   6.  Diarrhea: - She reports diarrhea improved.  Not  requiring medication.    No orders of the defined types were placed in this encounter.     [name], MD   8/28/202411:08 AM  CHIEF COMPLAINT:   Diagnosis: stage IIc (T4b N0) low rectal cancer    Cancer Staging  Rectal cancer [facility]) Staging form: Colon and Rectum, AJCC 8th Edition - Clinical stage from 01/03/2023: Stage IIC (cT4b, cN0, cM0) - Unsigned    Prior Therapy: FOLFIRINOX   Current Therapy:  chemoradiation with Xeloda    HISTORY OF PRESENT ILLNESS:   Oncology History  Rectal cancer (HCC)  01/03/2023 Initial Diagnosis   Rectal cancer (HCC)   01/20/2023 - 04/02/2023 Chemotherapy   Patient is on Treatment Plan : RECTAL Modified FOLFIRINOX q14d x 8 cycles        INTERVAL HISTORY:   [name] is a 67 y.o. female seen for follow-up of rectal cancer.  She is discharged home from rehab center on 07/07/2023.  She reports appetite of 75% and energy levels 25%.  Denies any diarrhea.  She reports that she is eating better since discharge.   PAST MEDICAL HISTORY:   Past Medical History: Past Medical History:  Diagnosis Date   Anxiety    Cancer (HCC)    Coronary atherosclerosis    Cardiac CT 09/2018 with calcium score 8.7 and mild proximal LAD disease   Essential hypertension 2009   GERD (gastroesophageal reflux disease)    Hypothyroidism    Iron deficiency anemia    Osteoarthritis    Palpitations    Tachypalpitations on beta blockers since 2010   Type 2 diabetes mellitus (HCC) 2003   Vitamin B 12 deficiency    Vitamin D deficiency     Surgical History: Past Surgical History:  Procedure Laterality Date   ABDOMINAL HYSTERECTOMY     History of cervical cancer   BIOPSY  12/16/2022   Procedure: BIOPSY;  Surgeon: Corbin Ade, MD;  [facility];  Service: Endoscopy;;  gastric, rectal   BREAST BIOPSY Right    COLONOSCOPY WITH PROPOFOL N/A 12/16/2022   Procedure: COLONOSCOPY WITH PROPOFOL;  Surgeon: Corbin Ade, MD;  [facility];   Service: Endoscopy;  Laterality: N/A;  10:45 am   ESOPHAGOGASTRODUODENOSCOPY (EGD) WITH PROPOFOL N/A 12/16/2022   Procedure: ESOPHAGOGASTRODUODENOSCOPY (EGD) WITH PROPOFOL;  Surgeon: Corbin Ade, MD;  [facility];  Service: Endoscopy;  Laterality: N/A;   IR IMAGING GUIDED PORT INSERTION  01/15/2023   MALONEY DILATION N/A 12/16/2022   Procedure: Elease Hashimoto DILATION;  Surgeon: Corbin Ade, MD;  [facility];  Service: Endoscopy;  Laterality: N/A;   POLYPECTOMY  12/16/2022   Procedure: POLYPECTOMY;  Surgeon: Corbin Ade, MD;  [facility];  Service: Endoscopy;;   TONSILLECTOMY      Social History: Social History   Socioeconomic History   Marital status: Single    Spouse name: Not on file   Number of children: 2   Years of education: Not on file   Highest education level: Not on file  Occupational History   Occupation: DISABLED    Employer: UNEMPLOYED  Comment: [employer]  Tobacco Use   Smoking status: Never   Smokeless tobacco: Never  Vaping Use   Vaping status: Never Used  Substance and Sexual Activity   Alcohol use: No   Drug use: No   Sexual activity: Never  Other Topics Concern   Not on file  Social History Narrative   Has 2 grandchildren. 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 : Never true  Transportation Needs: Not on file (07/13/2023)  Recent Concern: Transportation Needs - Unmet Transportation Needs (07/09/2023)   Received from [facility]    In the past 12 months, has lack of reliable transportation kept  you from medical appointments, meetings, work or from getting things needed for daily living? : Yes  Physical Activity: Not on file  Stress: Not on file  Social Connections: Not on file  Intimate Partner Violence: Not At Risk (06/01/2023)   Humiliation, Afraid, Rape, and Kick questionnaire    Fear of Current or Ex-Partner: No    Emotionally Abused: No    Physically Abused: No    Sexually Abused: No    Family History: Family History  Problem Relation Age of Onset   Heart failure Mother    Diabetes Father    Hypertension Father    Stroke Sister 75   Diabetes Brother    Cancer - Colon Other        age 80   Colon polyps Neg Hx     Current Medications:  Current Outpatient Medications:    blood glucose meter kit and supplies KIT, Dispense based on patient and insurance preference. Use up to four times daily as directed. (FOR ICD-10 E11.65) Number of Strips 400 Number of Lancets 400, Disp: 1 each, Rfl: 0   Blood Glucose Monitoring Suppl (TRUE METRIX METER) w/Device KIT, 1 each by Does not apply route 2 (two) times daily. Use to test BG bid E11.65, Disp: 1 kit, Rfl: 0   dicyclomine (BENTYL) 10 MG capsule, Take 1 capsule (10 mg total) by mouth every 8 (eight) hours as needed for spasms., Disp: 90 capsule, Rfl: 1   diphenoxylate-atropine (LOMOTIL) 2.5-0.025 MG tablet, Take 1 tablet by mouth 4 (four) times daily. (Patient taking differently: Take 2 tablets by mouth 4 (four) times daily.), Disp: 360 tablet, Rfl: 3   Feeding Tubes (FEEDING TUBE ATTACHMENT DEVICE) MISC, 3 in 1 Bedside commode, Disp: , Rfl:    FLUOROURACIL IV, Inject into the vein every 14 (fourteen) days., Disp: , Rfl:    HYDROcodone-acetaminophen (NORCO/VICODIN) 5-325 MG tablet, Take 1 tablet by mouth every 6 (six) hours as needed for moderate pain., Disp: 10 tablet, Rfl: 0   insulin aspart (NOVOLOG FLEXPEN) 100 UNIT/ML FlexPen, INJECT 0 TO 6 UNITS UNDER SKIN THREE TIMES DAILY WITH MEALS (DISCARD PEN 28 DAYS AFTER OPENING), Disp:  15 mL, Rfl: 0   insulin glargine (LANTUS SOLOSTAR) 100 UNIT/ML Solostar Pen, INJECT 40 UNITS UNDER THE SKIN AT BEDTIME, Disp: 45 mL, Rfl: 0   Insulin Pen Needle (DROPLET PEN  NEEDLES) 31G X 5 MM MISC, USE AS INSTRUCTED TO INJECT INSULIN FOUR TIMES DAILY, Disp: 100 each, Rfl: 3   IRINOTECAN HCL IV, Inject into the vein every 14 (fourteen) days., Disp: , Rfl:    LEUCOVORIN CALCIUM IV, Inject into the vein every 14 (fourteen) days., Disp: , Rfl:    levothyroxine (SYNTHROID) 88 MCG tablet, TAKE 1 TABLET EVERY DAY BEFORE BREAKFAST (Patient taking differently: Take 88 mcg by mouth daily before breakfast.), Disp: 90 tablet, Rfl: 3   loperamide (IMODIUM) 2 MG capsule, Take 1 capsule (2 mg total) by mouth as needed for diarrhea or loose stools. TAKE 1 CAPSULE AS DIRECTED AS NEEDED FOR DIARRHEA OR LOOSE STOOLS, Disp: 30 capsule, Rfl: 11   magnesium oxide (MAG-OX) 400 (240 Mg) MG tablet, Take 2 tablets (800 mg total) by mouth daily., Disp: 120 tablet, Rfl: 3   megestrol (MEGACE) 400 MG/10ML suspension, Take 10 mLs (400 mg total) by mouth 2 (two) times daily., Disp: 240 mL, Rfl: 0   Misc. Devices (ROLLATOR ULTRA-LIGHT) MISC, Rollator walker, Disp: , Rfl:    ondansetron (ZOFRAN-ODT) 4 MG disintegrating tablet, Take 1 tablet (4 mg total) by mouth every 8 (eight) hours as needed for nausea or vomiting. DISSOLVE 2 TAB ON TONGUE EVERY 8HR AS NEEDED NAUSEA, VOMITING (BETWEEN COMPAZINE DOSES FOR MAX RELIEF) Strength: 4 mg, Disp: 60 tablet, Rfl: 1   OXALIPLATIN IV, Inject into the vein every 14 (fourteen) days., Disp: , Rfl:    pantoprazole (PROTONIX) 40 MG tablet, Take 1 tablet (40 mg total) by mouth 2 (two) times daily before a meal., Disp: 60 tablet, Rfl: 3   Potassium Chloride 40 MEQ/15ML (20%) SOLN, Take 15 mLs by mouth 3 (three) times daily., Disp: 473 mL, Rfl: 0   prochlorperazine (COMPAZINE) 10 MG tablet, Take 1 tablet (10 mg total) by mouth every 8 (eight) hours as needed for nausea or vomiting. TAKE 1 TABLET  EVERY 6 HOURS AS NEEDED FOR NAUSEA OR VOMITING Strength: 10 mg, Disp: 30 tablet, Rfl: 3   QUEtiapine (SEROQUEL) 100 MG tablet, Take 1 tablet (100 mg total) by mouth at bedtime., Disp: 20 tablet, Rfl: 0   TRUE METRIX BLOOD GLUCOSE TEST test strip, TEST BLOOD SUGAR TWICE DAILY, Disp: 200 strip, Rfl: 3 No current facility-administered medications for this visit.  Facility-Administered Medications Ordered in Other Visits:    0.9 %  sodium chloride infusion, , Intravenous, Continuous, [name], MD, Stopped at 02/17/23 1535   sodium chloride flush (NS) 0.9 % injection 10 mL, 10 mL, Intravenous, PRN, [name], MD, 10 mL at 07/21/23 1022   Allergies: Allergies  Allergen Reactions   Contrast Media [Iodinated Contrast Media] Other (See Comments)    Burning   Sulfa Antibiotics Nausea And Vomiting    Other reaction(s): GI Upset (intolerance) unknown   Doxepin Rash and Swelling   Tape Rash    REVIEW OF SYSTEMS:   Review of Systems  Constitutional:  Negative for chills, fatigue and fever.  HENT:   Negative for lump/mass, mouth sores, nosebleeds, sore throat and trouble swallowing.   Eyes:  Negative for eye problems.  Respiratory:  Negative for cough and shortness of breath.   Cardiovascular:  Negative for chest pain, leg swelling and palpitations.  Gastrointestinal:  Positive for abdominal pain (lower quadrants, 5/10 severity). Negative for constipation, diarrhea, nausea and vomiting.  Genitourinary:  Negative for bladder incontinence, difficulty urinating, dysuria, frequency, hematuria and nocturia.        +UTI +catheter  Musculoskeletal:  Negative  for arthralgias, back pain, flank pain, myalgias and neck pain.  Skin:  Negative for itching and rash.  Neurological:  Positive for dizziness and headaches.  Hematological:  Does not bruise/bleed easily.  Psychiatric/Behavioral:  Positive for sleep disturbance (problems staying asleep). Negative for suicidal ideas.   All  other systems reviewed and are negative.    VITALS:   There were no vitals taken for this visit.  Wt Readings from Last 3 Encounters:  06/07/23 256 lb 9.9 oz (116.4 kg)  05/19/23 237 lb 6.4 oz (107.7 kg)  05/13/23 229 lb (103.9 kg)    There is no height or weight on file to calculate BMI.  Performance status (ECOG): 2 - Symptomatic, <50% confined to bed  PHYSICAL EXAM:   Physical Exam Vitals and nursing note reviewed. Exam conducted with a chaperone present.  Constitutional:      Appearance: Normal appearance.  Cardiovascular:     Rate and Rhythm: Normal rate and regular rhythm.     Pulses: Normal pulses.     Heart sounds: Normal heart sounds.  Pulmonary:     Effort: Pulmonary effort is normal.     Breath sounds: Normal breath sounds.  Abdominal:     Palpations: Abdomen is soft. There is no hepatomegaly, splenomegaly or mass.     Tenderness: There is no abdominal tenderness.  Musculoskeletal:     Right lower leg: No edema.     Left lower leg: No edema.  Lymphadenopathy:     Cervical: No cervical adenopathy.     Right cervical: No superficial, deep or posterior cervical adenopathy.    Left cervical: No superficial, deep or posterior cervical adenopathy.     Upper Body:     Right upper body: No supraclavicular or axillary adenopathy.     Left upper body: No supraclavicular or axillary adenopathy.  Neurological:     General: No focal deficit present.     Mental Status: She is alert and oriented to person, place, and time.  Psychiatric:        Mood and Affect: Mood normal.        Behavior: Behavior normal.     LABS:      Latest Ref Rng & Units 07/21/2023   10:21 AM 06/09/2023   10:17 AM 06/06/2023    4:12 AM  CBC  WBC 4.0 - 10.5 K/uL 3.4  3.3  2.4   Hemoglobin 12.0 - 15.0 g/dL 9.7  9.9  9.2   Hematocrit 36.0 - 46.0 % 30.8  31.5  29.6   Platelets 150 - 400 K/uL 121  111  59       Latest Ref Rng & Units 06/09/2023   10:17 AM 06/06/2023    4:12 AM 06/05/2023     7:17 AM  CMP  Glucose 70 - 99 mg/dL 401  027  253   BUN 8 - 23 mg/dL 9  8  10    Creatinine 0.44 - 1.00 mg/dL 6.64  4.03  4.74   Sodium 135 - 145 mmol/L 131  131  130   Potassium 3.5 - 5.1 mmol/L 3.7  3.4  3.6   Chloride 98 - 111 mmol/L 102  102  100   CO2 22 - 32 mmol/L 23  22  24    Calcium 8.9 - 10.3 mg/dL 7.7  7.3  7.3   Total Protein 6.5 - 8.1 g/dL 5.1     Total Bilirubin 0.3 - 1.2 mg/dL 0.9     Alkaline Phos 38 -  126 U/L 148     AST 15 - 41 U/L 32     ALT 0 - 44 U/L 13        Lab Results  Component Value Date   CEA1 3.8 12/16/2022   /  CEA  Date Value Ref Range Status  12/16/2022 3.8 0.0 - 4.7 ng/mL Final    Comment:    (NOTE)                             Nonsmokers          <3.9                             Smokers             <5.6 Roche [employer] Electrochemiluminescence Immunoassay (ECLIA) Values obtained with different assay methods or kits cannot be used interchangeably.  Results cannot be interpreted as absolute evidence of the presence or absence of malignant disease. Performed At: [facility] [address] [place], [place] [id number] Jolene Schimke MD YN:[id number]    No results found for: "PSA1" No results found for: "CAN199" No results found for: "CAN125"  No results found for: "TOTALPROTELP", "ALBUMINELP", "A1GS", "A2GS", "BETS", "BETA2SER", "GAMS", "MSPIKE", "SPEI" Lab Results  Component Value Date   TIBC 157 (L) 04/09/2023   TIBC 370 01/04/2023   FERRITIN 2,274 (H) 04/09/2023   FERRITIN 4 (L) 01/04/2023   IRONPCTSAT 57 (H) 04/09/2023   IRONPCTSAT 4 (L) 01/04/2023   Lab Results  Component Value Date   LDH 136 01/04/2023     STUDIES:   No results found.

## 2023-07-21 NOTE — Patient Instructions (Signed)

## 2023-07-21 NOTE — Addendum Note (Signed)
Addended by: [name] on: 07/21/2023 06:22 PM   Modules accepted: Orders

## 2023-07-22 ENCOUNTER — Encounter: Payer: Self-pay | Admitting: Hematology

## 2023-07-22 ENCOUNTER — Other Ambulatory Visit: Payer: Self-pay | Admitting: *Deleted

## 2023-07-22 DIAGNOSIS — C20 Malignant neoplasm of rectum: Secondary | ICD-10-CM

## 2023-07-27 ENCOUNTER — Other Ambulatory Visit: Payer: Self-pay | Admitting: *Deleted

## 2023-07-27 ENCOUNTER — Encounter: Payer: Self-pay | Admitting: Hematology

## 2023-07-27 MED ORDER — HYDROXYZINE PAMOATE 25 MG PO CAPS: 25.0000 mg | ORAL_CAPSULE | Freq: Two times a day (BID) | ORAL | 0 refills | Status: DC | PRN

## 2023-07-27 NOTE — Telephone Encounter (Signed)
Patient was taking hydroxyzine 25 mg BID while in Nursing Rehab and was effective for anxiety, however was not prescribed upon discharge.  Per Dr. Ellin Saba, may send to pharmacy.

## 2023-07-29 ENCOUNTER — Inpatient Hospital Stay: Payer: 59

## 2023-07-29 ENCOUNTER — Inpatient Hospital Stay: Payer: 59 | Admitting: Dietician

## 2023-07-29 ENCOUNTER — Inpatient Hospital Stay: Payer: 59 | Attending: Hematology

## 2023-07-29 VITALS — BP 102/55 | HR 101 | Temp 98.4°F | Resp 18

## 2023-07-29 DIAGNOSIS — C20 Malignant neoplasm of rectum: Secondary | ICD-10-CM | POA: Diagnosis present

## 2023-07-29 DIAGNOSIS — E876 Hypokalemia: Secondary | ICD-10-CM

## 2023-07-29 LAB — MAGNESIUM: Magnesium: 1.3 mg/dL — ABNORMAL LOW (ref 1.7–2.4)

## 2023-07-29 LAB — COMPREHENSIVE METABOLIC PANEL
ALT: 14 U/L (ref 0–44)
AST: 27 U/L (ref 15–41)
Albumin: 2.5 g/dL — ABNORMAL LOW (ref 3.5–5.0)
Alkaline Phosphatase: 55 U/L (ref 38–126)
Anion gap: 8 (ref 5–15)
BUN: 17 mg/dL (ref 8–23)
CO2: 25 mmol/L (ref 22–32)
Calcium: 8.2 mg/dL — ABNORMAL LOW (ref 8.9–10.3)
Chloride: 107 mmol/L (ref 98–111)
Creatinine, Ser: 0.8 mg/dL (ref 0.44–1.00)
GFR, Estimated: >60 mL/min (ref >60)
Glucose, Bld: 151 mg/dL — ABNORMAL HIGH (ref 70–99)
Potassium: 3.8 mmol/L (ref 3.5–5.1)
Sodium: 140 mmol/L (ref 135–145)
Total Bilirubin: 0.6 mg/dL (ref 0.3–1.2)
Total Protein: 5.8 g/dL — ABNORMAL LOW (ref 6.5–8.1)

## 2023-07-29 LAB — CBC
HCT: 32.4 % — ABNORMAL LOW (ref 36.0–46.0)
Hemoglobin: 10.1 g/dL — ABNORMAL LOW (ref 12.0–15.0)
MCH: 31.5 pg (ref 26.0–34.0)
MCHC: 31.2 g/dL (ref 30.0–36.0)
MCV: 100.9 fL — ABNORMAL HIGH (ref 80.0–100.0)
Platelets: 120 10*3/uL — ABNORMAL LOW (ref 150–400)
RBC: 3.21 MIL/uL — ABNORMAL LOW (ref 3.87–5.11)
RDW: 16.5 % — ABNORMAL HIGH (ref 11.5–15.5)
WBC: 3.4 10*3/uL — ABNORMAL LOW (ref 4.0–10.5)
nRBC: 0 % (ref 0.0–0.2)

## 2023-07-29 LAB — SAMPLE TO BLOOD BANK

## 2023-07-29 MED ORDER — SODIUM CHLORIDE 0.9 % IV SOLN: INTRAVENOUS | Status: DC

## 2023-07-29 MED ORDER — MAGNESIUM SULFATE 4 GM/100ML IV SOLN
4.0000 g | Freq: Once | INTRAVENOUS | Status: AC
  Administered 2023-07-29: 4 g via INTRAVENOUS
  Filled 2023-07-29: qty 100

## 2023-07-29 MED ORDER — SODIUM CHLORIDE 0.9% FLUSH
10.0000 mL | INTRAVENOUS | Status: DC | PRN
  Administered 2023-07-29: 10 mL via INTRAVENOUS

## 2023-07-29 MED ORDER — HEPARIN SOD (PORK) LOCK FLUSH 100 UNIT/ML IV SOLN
500.0000 [IU] | Freq: Once | INTRAVENOUS | Status: AC
  Administered 2023-07-29: 500 [IU] via INTRAVENOUS

## 2023-07-29 NOTE — Progress Notes (Signed)
Patient presents today possible IV magnesium per provider's order's. Vital sign stable. Pt's magnesium noted to be 1.3 today, pt will receive 4g IV magnesium sulfate per Dr.K's standing orders.  Discharged from clinic via wheelchair in stable condition. Alert and oriented x 3. F/U with [facility] as scheduled.

## 2023-07-29 NOTE — Progress Notes (Signed)
Nutrition Follow-up:  Patient has completed chemotherapy for colon cancer. She is currently receiving radiation under the care of Dr. Mitzi Hansen (last radiation 7/5) Xeloda discontinued due to poor tolerance   7/9-7/15 hospital admission with weakness - discharged to [facility] for rehab.  Met with pt in infusion. She reports appetite has picked up since being home. She is eating small portions several times daily. Recalls cereal, yogurt, mac/cheese, potatoes, pork chop yesterday. Patient is drinking ~48 ounces of 2% milk and ~20 ounces of water. Diarrhea has resolved. She denies nausea, vomiting.   Medications: reviewed   Labs: glucose 151, albumin 2.5 (trending up)  Anthropometrics: Pt declined to weigh today  7/15 - 256 lb 9.9 oz 6/26 - 237 lb 6.4 oz   NUTRITION DIAGNOSIS: Food and nutrition related knowledge deficit continues      MALNUTRITION DIAGNOSIS: Severe malnutrition ongoing   INTERVENTION:  Continue eating small frequent meals/snacks with adequate calories and protein    MONITORING, EVALUATION, GOAL: wt trends, intake    NEXT VISIT: Monday September 30 during infusion

## 2023-07-29 NOTE — Patient Instructions (Signed)
[facility]  Discharge Instructions: Thank you for choosing [facility] to provide your oncology and hematology care.  If you have a lab appointment with the Cancer Center - please note that after April 8th, 2024, all labs will be drawn in the cancer center.  You do not have to check in or register with the main entrance as you have in the past but will complete your check-in in the cancer center.  Wear comfortable clothing and clothing appropriate for easy access to any Portacath or PICC line.   We strive to give you quality time with your provider. You may need to reschedule your appointment if you arrive late (15 or more minutes).  Arriving late affects you and other patients whose appointments are after yours.  Also, if you miss three or more appointments without notifying the office, you may be dismissed from the clinic at the provider's discretion.      For prescription refill requests, have your pharmacy contact our office and allow 72 hours for refills to be completed.    Today you received 4g IV magnesium sulfate     BELOW ARE SYMPTOMS THAT SHOULD BE REPORTED IMMEDIATELY: *FEVER GREATER THAN 100.4 F (38 C) OR HIGHER *CHILLS OR SWEATING *NAUSEA AND VOMITING THAT IS NOT CONTROLLED WITH YOUR NAUSEA MEDICATION *UNUSUAL SHORTNESS OF BREATH *UNUSUAL BRUISING OR BLEEDING *URINARY PROBLEMS (pain or burning when urinating, or frequent urination) *BOWEL PROBLEMS (unusual diarrhea, constipation, pain near the anus) TENDERNESS IN MOUTH AND THROAT WITH OR WITHOUT PRESENCE OF ULCERS (sore throat, sores in mouth, or a toothache) UNUSUAL RASH, SWELLING OR PAIN  UNUSUAL VAGINAL DISCHARGE OR ITCHING   Items with * indicate a potential emergency and should be followed up as soon as possible or go to the Emergency Department if any problems should occur.  Please show the CHEMOTHERAPY ALERT CARD or IMMUNOTHERAPY ALERT CARD at check-in to the Emergency Department  and triage nurse.  Should you have questions after your visit or need to cancel or reschedule your appointment, please contact [facility] [phone]  and follow the prompts.  Office hours are 8:00 a.m. to 4:30 p.m. Monday - Friday. Please note that voicemails left after 4:00 p.m. may not be returned until the following business day.  We are closed weekends and major holidays. You have access to a nurse at all times for urgent questions. Please call the main number to the clinic [phone] and follow the prompts.  For any non-urgent questions, you may also contact your provider using MyChart. We now offer e-Visits for anyone 78 and older to request care online for non-urgent symptoms. For details visit [email or web address].   Also download the MyChart app! Go to the app store, search "MyChart", open the app, select [facility], and log in with your MyChart username and password.

## 2023-07-30 ENCOUNTER — Ambulatory Visit (HOSPITAL_BASED_OUTPATIENT_CLINIC_OR_DEPARTMENT_OTHER)
Admission: RE | Admit: 2023-07-30 | Discharge: 2023-07-30 | Disposition: A | Discharge status: Home / Self Care | Payer: 59 | Source: Ambulatory Visit | Attending: Hematology | Admitting: Hematology

## 2023-07-30 ENCOUNTER — Encounter: Payer: Self-pay | Admitting: Hematology

## 2023-07-30 DIAGNOSIS — C20 Malignant neoplasm of rectum: Secondary | ICD-10-CM | POA: Diagnosis present

## 2023-08-02 NOTE — Progress Notes (Deleted)
GI Office Note    Referring Provider: Gwenlyn Found, MD Primary Care Physician:  Gwenlyn Found, MD Primary Gastroenterologist: Gerrit Friends.Rourk, MD  Date:  08/03/2023  ID:  [name], DOB 12-Oct-1956, MRN [id number]   Chief Complaint   No chief complaint on file.  History of Present Illness  [name] is a 67 y.o. female with a history of *** presenting today with complaint of   Labs June 2022: Hemoglobin 7.8, MCV 63.2, platelets 167, iron 17, ferritin 3, saturation 4%, B12 216   Labs March 2023: Iron 28, ferritin 9, saturation 7%, B12 859   Labs April 2023: Hemoglobin 10.7, MCV 72.7, platelets unable to verify due to clumping.     Per referral paperwork received from PCP patient had positive Cologuard test in November 2019.  Last office visit note 10/09/2022 with PCP.  Etiology of IDA unknown, history of positive Cologuard, unable to complete colonoscopy due to transportation issue.  Noted to have GERD that was controlled on PPI.  Previous symptoms were dizziness and fatigue as well as dyspnea improved after starting oral iron 1 year prior.  Complained of new dizziness, fatigue, dyspnea over the last few days.  Noted to have a low hemoglobin of 7 in late 2022.  History of Rehman exam, taking magnesium oxide 400 mg nightly to help with leg cramps.  On pravastatin for HLD, strong family history of ASCVD.  Cardiac CT in 2019 with calcium score 8.7 and mild proximal LAD disease.  Taking Synthroid daily.  On Lantus 60 units nightly and NovoLog 8 to 14 units 3 times daily before meals, Mounjaro, and metformin.  CBC and iron panel ordered as well as referral to GI.  Patient stated she was ready for colonoscopy.  Advised to continue PPI and consider upper endoscopy as well.  Previously with B12 deficiency, not on supplementation.  Plan to recheck.   Labs 10/09/2022: Iron 20, ferritin 8, saturation 5%, hemoglobin 9.6, MCV 77.9, platelets 151, B12 420   Office visit 11/26/2022.  She  reported ongoing symptoms of GERD, not having good appetite and was having early satiety.  Denies any weight loss.  Reported chronic use of PPI since 2009 and was having some dysphagia that was worsening over the last 3 to 4 months.  Denied any nausea/vomiting.  Reported drinking a lot of water.  Has been on oral iron once daily for couple of years and previously was on B12 injections that she had recently restarted this past summer.  Has had some periods of shortness of breath and has constant fatigue.  Was having some BRBPR usually with stools.  Denies any constipation.  Has not needed any stool softener in years.  Denies any melena.  Did admit to occasional palpitations but denied any chest pain.  CBC was checked.  Advised to continue iron and PPI.  Scheduled for upper endoscopy with possible dilation and colonoscopy with Dr. Jena Gauss.  Advised may need to consider hematology referral for IV iron.   EGD 12/16/2022: -Normal esophagus s/p dilation -Abnormal appearing stomach of uncertain significance s/p biopsy -Duodenal diverticulum -Stomach biopsy with mild chronic gastritis with lymphoid aggregate negative for H. 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   CT chest, abdomen, and pelvis 12/17/2022: -Locally advanced low rectal primary without bowel obstruction or nodule metastasis -Isolated right middle lobe subpleural pulmonary nodule most likely benign recommend attention on follow-up -Cirrhosis and portal venous hypertension with several tiny low-density foci within the  liver most likely degenerative nodules no evidence of hepatic metastasis -Complex 2.6 cm right renal lesion could represent a hemorrhagic/proteinaceous cyst or solid neoplasm which could be reevaluated with pre and postcontrast abdominal imaging with follow-up CT -Right-sided thyroid/isthmic nodule of 1.8 cm recommend thyroid ultrasound -Cholelithiasis   Patient underwent pelvic MRI 12/24/2022 revealing rectal adenocarcinoma stage T3d, 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  Distance from tumor to i internal sphincter is 2.8 cm   PET scan 12/31/2022 with no rectal lesions without hypermetabolic perirectal or pelvic lymphadenopathy -Subtle too small to characterize hypoattenuating lesions in the hepatic dome previous CT without any detectable hypermetabolism at -2.3 cm inferior right thyroid nodule without hypermetabolism -2.7 cm exophytic posterior right interpolar renal lesion without hypermetabolism likely a cyst complicated by proteinaceous debris or hemorrhage -Focal hypermetabolism in the region of the gallbladder neck that corresponds to 13 mm focus of increased attenuation in the lumen of the gallbladder.  Given FDG accumulation in this region gallbladder polyp/neoplasm is a concern.  RUQ [place] recommended to further evaluate.   Labs 12/16/2022 with normal CEA, hemoglobin 8.7, MCV 77.1, platelets 207, albumin 3.2, AST 41, ALT 16, alk phos 42, T. bili 0.7, A1c 6.6   Labs 01/20/2023 with hemoglobin 7.4, MCV 78.4, platelets 226, glucose 136, albumin 3.2, AST 36, ALT 13, alk phos 49, T. bili 0.6, sodium 136, creatinine 0.81, magnesium 1.7   Office visit 01/26/2023.  Just had first chemo treatment and has upcoming iron infusion scheduled.  Stable blood sugars.  Continue weight loss since last visit.  Following with Dr. Kirtland Bouchard regarding her rectal cancer.  Experience rectal bleeding Sunday before this visit.  Has lack of energy and appetite.  Having lower abdominal pain that occurs all the time.  Feeling nauseous morning of visit.   Began having some diarrhea after chemo treatment initially started to improve.  Advised by cancer center that she could take Imodium.  Swallowing good.  Acid reflux symptoms come and go.  Due to have surgery by [facility] after receiving chemo and radiation.   Labs after last visit revealed a hemoglobin of 5.8 and she was sent to [facility] to have 2 units of blood in place of IV iron infusion.  Acute hepatitis panel negative.   CT A/P with contrast 02/21/2023: - Fluid-filled to the rectum, consistent with diarrhea -Sigmoid diverticulosis with mild wall thickening of sigmoid -Unchanged rectal mass -Unchanged partially exophytic lesion of posterior midportion of right kidney which remains suspicious (consider MRI for further evaluation) -Cholelithiasis   MRI pelvis 04/12/2023: -Rectal adenocarcinoma T3c, N0 -Distance from tumor to internal and sphincters 2.4 cm   Labs 04/20/2023: Hemoglobin 9.9, platelets 177  Last office visit 04/28/23. ***   MRI pelvis 07/30/23: results pending   Today: IDA -  GERD -   Rectal CA -   Cirrhosis -   Current Outpatient Medications  Medication Sig Dispense Refill   blood glucose meter kit and supplies KIT Dispense based on patient and insurance preference. Use up to four times daily as directed. (FOR ICD-10 E11.65) Number of Strips 400 Number of Lancets 400 1 each 0   Blood Glucose Monitoring Suppl (TRUE METRIX METER) w/Device  KIT 1 each by Does not apply route 2 (two) times daily. Use to test BG bid E11.65 1 kit 0   dicyclomine (BENTYL) 10 MG capsule Take 1 capsule (10 mg total) by mouth every 8 (eight) hours as needed for spasms. 90 capsule 1   diphenoxylate-atropine (LOMOTIL) 2.5-0.025 MG tablet Take 1 tablet by mouth 4 (four) times daily. (Patient taking differently: Take 2 tablets by mouth 4 (four) times daily.) 360 tablet 3   Feeding Tubes (FEEDING TUBE ATTACHMENT DEVICE) MISC 3 in 1 Bedside commode     HYDROcodone-acetaminophen (NORCO/VICODIN) 5-325 MG tablet  Take 1 tablet by mouth every 6 (six) hours as needed for moderate pain. 10 tablet 0   hydrOXYzine (VISTARIL) 25 MG capsule Take 1 capsule (25 mg total) by mouth 2 (two) times daily as needed for anxiety. 60 capsule 0   insulin aspart (NOVOLOG FLEXPEN) 100 UNIT/ML FlexPen INJECT 0 TO 6 UNITS UNDER SKIN THREE TIMES DAILY WITH MEALS (DISCARD PEN 28 DAYS AFTER OPENING) 15 mL 0   insulin glargine (LANTUS SOLOSTAR) 100 UNIT/ML Solostar Pen INJECT 40 UNITS UNDER THE SKIN AT BEDTIME 45 mL 0   Insulin Pen Needle (DROPLET PEN NEEDLES) 31G X 5 MM MISC USE AS INSTRUCTED TO INJECT INSULIN FOUR TIMES DAILY 100 each 3   levothyroxine (SYNTHROID) 88 MCG tablet TAKE 1 TABLET EVERY DAY BEFORE BREAKFAST (Patient taking differently: Take 88 mcg by mouth daily before breakfast.) 90 tablet 3   loperamide (IMODIUM) 2 MG capsule Take 1 capsule (2 mg total) by mouth as needed for diarrhea or loose stools. TAKE 1 CAPSULE AS DIRECTED AS NEEDED FOR DIARRHEA OR LOOSE STOOLS 30 capsule 11   magnesium oxide (MAG-OX) 400 (240 Mg) MG tablet Take 2 tablets (800 mg total) by mouth daily. 120 tablet 3   megestrol (MEGACE) 400 MG/10ML suspension Take 10 mLs (400 mg total) by mouth 2 (two) times daily. 240 mL 0   Misc. Devices (ROLLATOR ULTRA-LIGHT) MISC Rollator walker     ondansetron (ZOFRAN-ODT) 4 MG disintegrating tablet Take 1 tablet (4 mg total) by mouth every 8 (eight) hours as needed for nausea or vomiting. DISSOLVE 2 TAB ON TONGUE EVERY 8HR AS NEEDED NAUSEA, VOMITING (BETWEEN COMPAZINE DOSES FOR MAX RELIEF) Strength: 4 mg 60 tablet 1   oxyCODONE (OXY IR/ROXICODONE) 5 MG immediate release tablet Take 1 tablet (5 mg total) by mouth every 12 (twelve) hours as needed for severe pain. 60 tablet 0   pantoprazole (PROTONIX) 40 MG tablet Take 1 tablet (40 mg total) by mouth 2 (two) times daily before a meal. 60 tablet 3   Potassium Chloride 40 MEQ/15ML (20%) SOLN Take 15 mLs by mouth 3 (three) times daily. 473 mL 0   predniSONE  (DELTASONE) 50 MG tablet Take one tablet by mouth 13 hours prior to CT scan; take one tablet by mouth 7 hours prior to CT scan; take one tablet by mouth 1 hour prior to CT scan 3 tablet 0   prochlorperazine (COMPAZINE) 10 MG tablet Take 1 tablet (10 mg total) by mouth every 8 (eight) hours as needed for nausea or vomiting. TAKE 1 TABLET EVERY 6 HOURS AS NEEDED FOR NAUSEA OR VOMITING Strength: 10 mg 30 tablet 3   QUEtiapine (SEROQUEL) 100 MG tablet Take 1 tablet (100 mg total) by mouth at bedtime. 20 tablet 0   TRUE METRIX BLOOD GLUCOSE TEST test strip TEST BLOOD SUGAR TWICE DAILY 200 strip 3   No current facility-administered medications for this visit.  Facility-Administered Medications Ordered in Other Visits  Medication Dose Route Frequency Provider Last Rate Last Admin   0.9 %  sodium chloride infusion   Intravenous Continuous [name], MD   Stopped at 02/17/23 1535    Past Medical History:  Diagnosis Date   Anxiety    Cancer [facility])    Coronary atherosclerosis    Cardiac CT 09/2018 with calcium score 8.7 and mild proximal LAD disease   Essential hypertension 2009   GERD (gastroesophageal reflux disease)    Hypothyroidism    Iron deficiency anemia    Osteoarthritis    Palpitations    Tachypalpitations on beta blockers since 2010   Type 2 diabetes mellitus (HCC) 2003   Vitamin B 12 deficiency    Vitamin D deficiency     Past Surgical History:  Procedure Laterality Date   ABDOMINAL HYSTERECTOMY     History of cervical cancer   BIOPSY  12/16/2022   Procedure: BIOPSY;  Surgeon: Corbin Ade, MD;  [facility];  Service: Endoscopy;;  gastric, rectal   BREAST BIOPSY Right    COLONOSCOPY WITH PROPOFOL N/A 12/16/2022   Procedure: COLONOSCOPY WITH PROPOFOL;  Surgeon: Corbin Ade, MD;  Location: [facility];  Service: Endoscopy;  Laterality: N/A;  10:45 am   ESOPHAGOGASTRODUODENOSCOPY (EGD) WITH PROPOFOL N/A 12/16/2022   Procedure:  ESOPHAGOGASTRODUODENOSCOPY (EGD) WITH PROPOFOL;  Surgeon: Corbin Ade, MD;  [facility]: [facility];  Service: Endoscopy;  Laterality: N/A;   IR IMAGING GUIDED PORT INSERTION  01/15/2023   MALONEY DILATION N/A 12/16/2022   Procedure: Elease Hashimoto DILATION;  Surgeon: Corbin Ade, MD;  [facility]: [facility];  Service: Endoscopy;  Laterality: N/A;   POLYPECTOMY  12/16/2022   Procedure: POLYPECTOMY;  Surgeon: Corbin Ade, MD;  [facility];  Service: Endoscopy;;   TONSILLECTOMY      Family History  Problem Relation Age of Onset   Heart failure Mother    Diabetes Father    Hypertension Father    Stroke Sister 38   Diabetes Brother    Cancer - Colon Other        age 37   Colon polyps Neg Hx     Allergies as of 08/03/2023 - Review Complete 07/29/2023  Allergen Reaction Noted   Contrast media [iodinated contrast media] Other (See Comments) 10/04/2018   Sulfa antibiotics Nausea And Vomiting 04/02/2011   Doxepin Rash and Swelling 04/02/2011   Tape Rash 01/20/2023    Social History   Socioeconomic History   Marital status: Single    Spouse name: Not on file   Number of children: 2   Years of education: Not on file   Highest education level: Not on file  Occupational History   Occupation: DISABLED    Employer: UNEMPLOYED    Comment: [employer]  Tobacco Use   Smoking status: Never   Smokeless tobacco: Never  Vaping Use   Vaping status: Never Used  Substance and Sexual Activity   Alcohol use: No   Drug use: No   Sexual activity: Never  Other Topics Concern   Not on file  Social History Narrative   Has 2 grandchildren. 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 : Yes  Physical Activity: Not on file  Stress: Not on file  Social Connections: Not on file     Review of Systems   Gen: Denies fever, chills, anorexia. Denies fatigue, weakness, weight loss.  CV: Denies chest pain, palpitations, syncope, peripheral edema, and claudication. Resp: Denies dyspnea at rest, cough, wheezing, coughing up blood, and pleurisy. GI: See HPI Derm: Denies rash, itching, dry skin Psych: Denies depression, anxiety, memory loss, confusion. No homicidal or suicidal ideation.  Heme: Denies bruising, bleeding, and enlarged lymph nodes.   Physical Exam   There were no vitals taken for this visit.  General:   Alert and oriented. No distress noted. Pleasant and cooperative.  Head:  Normocephalic and atraumatic. Eyes:  Conjuctiva clear without scleral icterus. Mouth:  Oral mucosa pink and moist. Good dentition. No lesions. Lungs:  Clear to auscultation bilaterally. No wheezes, rales, or rhonchi. No distress.  Heart:  S1, S2 present without murmurs appreciated.  Abdomen:  +BS, soft, non-tender and non-distended. No rebound or guarding. No HSM or masses noted. Rectal: *** Msk:  Symmetrical without gross deformities. Normal posture. Extremities:  Without edema. Neurologic:  Alert and  oriented x4 Psych:  Alert and cooperative. Normal mood and affect.   Assessment  KASHAWNA VILLAVERDE is a 67 y.o. female with a history of *** presenting today with    PLAN   ***     Carrie Bonito, MSN, FNP-BC, AGACNP-BC [facility]

## 2023-08-03 ENCOUNTER — Encounter: Payer: Self-pay | Admitting: Gastroenterology

## 2023-08-03 ENCOUNTER — Ambulatory Visit: Payer: 59 | Admitting: Gastroenterology

## 2023-08-04 ENCOUNTER — Other Ambulatory Visit: Payer: Self-pay | Admitting: *Deleted

## 2023-08-04 MED ORDER — QUETIAPINE FUMARATE 100 MG PO TABS: 100.0000 mg | ORAL_TABLET | Freq: Every day | ORAL | 0 refills | Status: DC

## 2023-08-23 ENCOUNTER — Inpatient Hospital Stay: Payer: 59 | Admitting: Dietician

## 2023-08-23 ENCOUNTER — Ambulatory Visit
Admission: RE | Admit: 2023-08-23 | Discharge: 2023-08-23 | Disposition: A | Discharge status: Home / Self Care | Payer: 59 | Source: Ambulatory Visit | Attending: Radiation Oncology | Admitting: Radiation Oncology

## 2023-08-23 ENCOUNTER — Telehealth: Payer: Self-pay | Admitting: Dietician

## 2023-08-23 NOTE — Telephone Encounter (Signed)
Nutrition Follow-up:  Patient has completed chemotherapy for colon cancer. She is currently receiving radiation under the care of Dr. Mitzi Hansen (last radiation 7/5) Xeloda discontinued due to poor tolerance   Spoke with patient via telephone for nutrition follow-up. Patient says she is doing great. Her appetite continues to improve. Recalls noodles for lunch, chicken/veggie pasta for dinner. Patient did not eat breakfast. She continues drinking ~49 ounces of milk. She is drinking some water. Patient reports HH PT has been delayed secondary to new insurance on 9/1. States she has a urology appointment tomorrow (10/1) who will put in new orders for her. Patient reports feeling stronger. She continues to have wobbly legs when walking. Patient denies nutrition impact symptoms.   Medications: reviewed   Labs: 9/5 - glucose 151, albumin 2.5 (slowly trending up), Mg 1.3 (replaced)  Anthropometrics: No new wts to trend. Pt states she will request weight at urology appointment.   7/15 - 256 lb 9.9 oz   NUTRITION DIAGNOSIS: Food and nutrition related knowledge deficit improving    MALNUTRITION DIAGNOSIS: Severe malnutrition - suspect ongoing, but improving   INTERVENTION:  Encouraged small frequent meals/snacks  Reviewed foods with protein, recommend protein source at every meal Continue drinking milk for added calories/protein. Patient does not like Boost/Ensure    MONITORING, EVALUATION, GOAL: wt trends, intake   NEXT VISIT: To be scheduled as needed

## 2023-08-23 NOTE — Progress Notes (Signed)
Radiation Oncology         [phone]) [phone] ________________________________  Name: [name] MRN: [id number]  Date of Service: 08/23/2023  DOB: 1956/06/22  Post Treatment Telephone Note  Diagnosis:  C20 Malignant neoplasm of rectum Staging on 2023-01-03: Rectal cancer (HCC) T=cT4b, N=cN0, M=cM0(as documented in provider EOT note)  The patient was not available for call today.   The patient is scheduled for follow up or surgery with colorectal surgeon Dr. Maisie Fus   The patient has scheduled follow up with her medical oncologist Dr. Ellin Saba and their surgeon Dr. Maisie Fus  for ongoing surveillance. she was encouraged to call if she develops concerns or questions regarding radiation.    Ruel Favors, LPN

## 2023-08-26 ENCOUNTER — Other Ambulatory Visit: Payer: Self-pay | Admitting: Hematology

## 2023-09-02 ENCOUNTER — Other Ambulatory Visit: Payer: Self-pay | Admitting: *Deleted

## 2023-09-02 MED ORDER — OXYCODONE HCL 5 MG PO TABS: 5.0000 mg | ORAL_TABLET | Freq: Two times a day (BID) | ORAL | 0 refills | Status: DC | PRN

## 2023-09-07 ENCOUNTER — Other Ambulatory Visit: Payer: Self-pay | Admitting: *Deleted

## 2023-09-07 MED ORDER — OXYCODONE HCL 5 MG PO TABS: 5.0000 mg | ORAL_TABLET | Freq: Two times a day (BID) | ORAL | 0 refills | Status: DC | PRN

## 2023-09-09 ENCOUNTER — Other Ambulatory Visit: Payer: Self-pay | Admitting: *Deleted

## 2023-09-11 ENCOUNTER — Emergency Department (HOSPITAL_BASED_OUTPATIENT_CLINIC_OR_DEPARTMENT_OTHER)
Admission: EM | Admit: 2023-09-11 | Discharge: 2023-09-12 | Disposition: A | Discharge status: Home / Self Care | Payer: 59 | Attending: Emergency Medicine | Admitting: Emergency Medicine

## 2023-09-11 ENCOUNTER — Other Ambulatory Visit: Payer: Self-pay

## 2023-09-11 ENCOUNTER — Encounter (HOSPITAL_BASED_OUTPATIENT_CLINIC_OR_DEPARTMENT_OTHER): Payer: Self-pay

## 2023-09-11 DIAGNOSIS — N390 Urinary tract infection, site not specified: Secondary | ICD-10-CM | POA: Diagnosis not present

## 2023-09-11 DIAGNOSIS — I10 Essential (primary) hypertension: Secondary | ICD-10-CM | POA: Diagnosis not present

## 2023-09-11 DIAGNOSIS — T83511A Infection and inflammatory reaction due to indwelling urethral catheter, initial encounter: Secondary | ICD-10-CM | POA: Insufficient documentation

## 2023-09-11 DIAGNOSIS — Z794 Long term (current) use of insulin: Secondary | ICD-10-CM | POA: Diagnosis not present

## 2023-09-11 DIAGNOSIS — E039 Hypothyroidism, unspecified: Secondary | ICD-10-CM | POA: Insufficient documentation

## 2023-09-11 DIAGNOSIS — E119 Type 2 diabetes mellitus without complications: Secondary | ICD-10-CM | POA: Insufficient documentation

## 2023-09-11 LAB — URINALYSIS, W/ REFLEX TO CULTURE (INFECTION SUSPECTED)
Bilirubin Urine: NEGATIVE
Glucose, UA: NEGATIVE mg/dL
Ketones, ur: NEGATIVE mg/dL
Nitrite: NEGATIVE
Protein, ur: >300 mg/dL — AB
Specific Gravity, Urine: 1.013 (ref 1.005–1.030)
WBC, UA: >50 WBC/hpf (ref 0–5)
pH: 7.5 (ref 5.0–8.0)

## 2023-09-11 LAB — BASIC METABOLIC PANEL
Anion gap: 6 (ref 5–15)
BUN: 27 mg/dL — ABNORMAL HIGH (ref 8–23)
CO2: 28 mmol/L (ref 22–32)
Calcium: 9.2 mg/dL (ref 8.9–10.3)
Chloride: 104 mmol/L (ref 98–111)
Creatinine, Ser: 0.86 mg/dL (ref 0.44–1.00)
GFR, Estimated: >60 mL/min (ref >60)
Glucose, Bld: 177 mg/dL — ABNORMAL HIGH (ref 70–99)
Potassium: 4.2 mmol/L (ref 3.5–5.1)
Sodium: 138 mmol/L (ref 135–145)

## 2023-09-11 LAB — CBC WITH DIFFERENTIAL/PLATELET
Abs Immature Granulocytes: 0.02 10*3/uL (ref 0.00–0.07)
Basophils Absolute: 0 10*3/uL (ref 0.0–0.1)
Basophils Relative: 1 %
Eosinophils Absolute: 0.1 10*3/uL (ref 0.0–0.5)
Eosinophils Relative: 2 %
HCT: 38.8 % (ref 36.0–46.0)
Hemoglobin: 12.5 g/dL (ref 12.0–15.0)
Immature Granulocytes: 0 %
Lymphocytes Relative: 17 %
Lymphs Abs: 1 10*3/uL (ref 0.7–4.0)
MCH: 28.5 pg (ref 26.0–34.0)
MCHC: 32.2 g/dL (ref 30.0–36.0)
MCV: 88.6 fL (ref 80.0–100.0)
Monocytes Absolute: 0.4 10*3/uL (ref 0.1–1.0)
Monocytes Relative: 6 %
Neutro Abs: 4.3 10*3/uL (ref 1.7–7.7)
Neutrophils Relative %: 74 %
Platelets: 112 10*3/uL — ABNORMAL LOW (ref 150–400)
RBC: 4.38 MIL/uL (ref 3.87–5.11)
RDW: 13.6 % (ref 11.5–15.5)
WBC: 5.8 10*3/uL (ref 4.0–10.5)
nRBC: 0 % (ref 0.0–0.2)

## 2023-09-11 MED ORDER — SODIUM CHLORIDE 0.9 % IV SOLN
1.0000 g | Freq: Once | INTRAVENOUS | Status: AC
  Administered 2023-09-11: 1 g via INTRAVENOUS
  Filled 2023-09-11: qty 10

## 2023-09-11 MED ORDER — CEPHALEXIN 500 MG PO CAPS: 500.0000 mg | ORAL_CAPSULE | Freq: Four times a day (QID) | ORAL | 0 refills | Status: DC

## 2023-09-11 NOTE — Discharge Instructions (Addendum)
Follow-up with your urologist for management of the catheter.  A culture has been sent and you will be notified if does not match up with the antibiotics you were given.

## 2023-09-11 NOTE — ED Triage Notes (Signed)
Pt BIB on [facility] coming from home. Pt with F/C that is not draining. Pt reports when straining urine leaks around the cath. Pt is bed bound at baseline.

## 2023-09-11 NOTE — ED Provider Notes (Signed)
[place] EMERGENCY DEPARTMENT AT [facility] Provider Note   CSN: [id number] Arrival date & time: 09/11/23  1837     History {Add pertinent medical, surgical, social history, OB history to HPI:1} Chief Complaint  Patient presents with   Foley catheter problem    [name] is a 67 y.o. female.  HPI Patient with chronic Foley catheter.  Has had a catheter in for the last 4 months.  Last change about 2 weeks ago.  Reportedly has been having issues with it draining.  Reportedly has had to squeeze urine around it is not much is coming out of the catheter.  No fevers but feels she could have an infection again.  States the urine's been more concentrated.  Does have some lower abdominal pain. Patient is reportedly bedbound at baseline.    Past Medical History:  Diagnosis Date   Anxiety    Cancer (HCC)    Coronary atherosclerosis    Cardiac CT 09/2018 with calcium score 8.7 and mild proximal LAD disease   Essential hypertension 2009   GERD (gastroesophageal reflux disease)    Hypothyroidism    Iron deficiency anemia    Osteoarthritis    Palpitations    Tachypalpitations on beta blockers since 2010   Type 2 diabetes mellitus (HCC) 2003   Vitamin B 12 deficiency    Vitamin D deficiency     Home Medications Prior to Admission medications   Medication Sig Start Date End Date Taking? Authorizing Provider  blood glucose meter kit and supplies KIT Dispense based on patient and insurance preference. Use up to four times daily as directed. (FOR ICD-10 E11.65) Number of Strips 400 Number of Lancets 400 02/16/23   Roma Kayser, MD  Blood Glucose Monitoring Suppl (TRUE METRIX METER) w/Device KIT 1 each by Does not apply route 2 (two) times daily. Use to test BG bid E11.65 03/19/23   Dani Gobble, NP  dicyclomine (BENTYL) 10 MG capsule Take 1 capsule (10 mg total) by mouth every 8 (eight) hours as needed for spasms. 06/24/23   [name], MD   diphenoxylate-atropine (LOMOTIL) 2.5-0.025 MG tablet Take 1 tablet by mouth 4 (four) times daily. Patient taking differently: Take 2 tablets by mouth 4 (four) times daily. 03/25/23   Carnella Guadalajara, PA-C  Feeding Tubes (FEEDING TUBE ATTACHMENT DEVICE) MISC 3 in 1 Bedside commode 04/27/23   [provider]  HYDROcodone-acetaminophen (NORCO/VICODIN) 5-325 MG tablet Take 1 tablet by mouth every 6 (six) hours as needed for moderate pain. 07/19/23   [name], MD  hydrOXYzine (VISTARIL) 25 MG capsule TAKE ONE CAPSULE BY MOUTH 2 TIMES A DAY AS NEEDED FOR ANXIETY 08/26/23   [name], MD  insulin aspart (NOVOLOG FLEXPEN) 100 UNIT/ML FlexPen INJECT 0 TO 6 UNITS UNDER SKIN THREE TIMES DAILY WITH MEALS (DISCARD PEN 28 DAYS AFTER OPENING) 05/25/23   Dani Gobble, NP  insulin glargine (LANTUS SOLOSTAR) 100 UNIT/ML Solostar Pen INJECT 40 UNITS UNDER THE SKIN AT BEDTIME 05/25/23   Dani Gobble, NP  Insulin Pen Needle (DROPLET PEN NEEDLES) 31G X 5 MM MISC USE AS INSTRUCTED TO INJECT INSULIN FOUR TIMES DAILY 05/26/23   Dani Gobble, NP  levothyroxine (SYNTHROID) 88 MCG tablet TAKE 1 TABLET EVERY DAY BEFORE BREAKFAST Patient taking differently: Take 88 mcg by mouth daily before breakfast. 04/09/23   Dani Gobble, NP  loperamide (IMODIUM) 2 MG capsule Take 1 capsule (2 mg total) by mouth as needed for diarrhea or loose  stools. TAKE 1 CAPSULE AS DIRECTED AS NEEDED FOR DIARRHEA OR LOOSE STOOLS 04/14/23   Rojelio Brenner M, PA-C  magnesium oxide (MAG-OX) 400 (240 Mg) MG tablet Take 2 tablets (800 mg total) by mouth daily. 05/19/23   [name], MD  megestrol (MEGACE) 400 MG/10ML suspension Take 10 mLs (400 mg total) by mouth 2 (two) times daily. 06/05/23   Maurilio Lovely D, DO  Misc. Devices (ROLLATOR ULTRA-LIGHT) MISC Rollator walker 04/27/23   [provider]  ondansetron (ZOFRAN-ODT) 4 MG disintegrating tablet Take 1 tablet (4 mg total) by mouth every 8  (eight) hours as needed for nausea or vomiting. DISSOLVE 2 TAB ON TONGUE EVERY 8HR AS NEEDED NAUSEA, VOMITING (BETWEEN COMPAZINE DOSES FOR MAX RELIEF) Strength: 4 mg 04/13/23   Shon Hale, MD  oxyCODONE (OXY IR/ROXICODONE) 5 MG immediate release tablet Take 1 tablet (5 mg total) by mouth every 12 (twelve) hours as needed for severe pain (pain score 7-10). 09/07/23   [name], MD  pantoprazole (PROTONIX) 40 MG tablet Take 1 tablet (40 mg total) by mouth 2 (two) times daily before a meal. 04/13/23   Emokpae, Courage, MD  Potassium Chloride 40 MEQ/15ML (20%) SOLN Take 15 mLs by mouth 3 (three) times daily. 04/13/23   Shon Hale, MD  predniSONE (DELTASONE) 50 MG tablet Take one tablet by mouth 13 hours prior to CT scan; take one tablet by mouth 7 hours prior to CT scan; take one tablet by mouth 1 hour prior to CT scan 07/21/23   [name], MD  prochlorperazine (COMPAZINE) 10 MG tablet Take 1 tablet (10 mg total) by mouth every 8 (eight) hours as needed for nausea or vomiting. TAKE 1 TABLET EVERY 6 HOURS AS NEEDED FOR NAUSEA OR VOMITING Strength: 10 mg 04/13/23   Shon Hale, MD  QUEtiapine (SEROQUEL) 100 MG tablet TAKE ONE TABLET BY MOUTH AT BEDTIME 08/26/23   [name], MD  TRUE METRIX BLOOD GLUCOSE TEST test strip TEST BLOOD SUGAR TWICE DAILY 11/10/22   Roma Kayser, MD      Allergies    Contrast media [iodinated contrast media], Sulfa antibiotics, Doxepin, and Tape    Review of Systems   Review of Systems  Physical Exam Updated Vital Signs BP 118/74 (BP Location: Left Arm)   Pulse 88   Temp 97.8 F (36.6 C) (Oral)   Resp 16   Ht 5\' 8"  (1.727 m)   Wt 113.4 kg   SpO2 95%   BMI 38.01 kg/m  Physical Exam Vitals and nursing note reviewed.  HENT:     Head: Normocephalic.  Cardiovascular:     Rate and Rhythm: Normal rate.  Abdominal:     Comments: Mild suprapubic pain without rebound or guarding.  No hernia palpated.  Skin:     Capillary Refill: Capillary refill takes less than 2 seconds.  Neurological:     Mental Status: She is alert.     ED Results / Procedures / Treatments   Labs (all labs ordered are listed, but only abnormal results are displayed) Labs Reviewed  URINALYSIS, W/ REFLEX TO CULTURE (INFECTION SUSPECTED)  BASIC METABOLIC PANEL  CBC WITH DIFFERENTIAL/PLATELET    EKG None  Radiology No results found.  Procedures Procedures  {Document cardiac monitor, telemetry assessment procedure when appropriate:1}  Medications Ordered in ED Medications - No data to display  ED Course/ Medical Decision Making/ A&P   {   Click here for ABCD2, HEART and other calculatorsREFRESH Note before signing :1}  Medical Decision Making Amount and/or Complexity of Data Reviewed Labs: ordered.   Patient with recurrent infections with Foley catheter.  Reportedly decreased drainage.  Does have concentrated urine in the bag but a small amount.  Will check basic blood work and including kidney function and CBC.  Will get urinalysis and change out Foley catheter.  {Document critical care time when appropriate:1} {Document review of labs and clinical decision tools ie heart score, Chads2Vasc2 etc:1}  {Document your independent review of radiology images, and any outside records:1} {Document your discussion with family members, caretakers, and with consultants:1} {Document social determinants of health affecting pt's care:1} {Document your decision making why or why not admission, treatments were needed:1} Final Clinical Impression(s) / ED Diagnoses Final diagnoses:  None    Rx / DC Orders ED Discharge Orders     None

## 2023-09-12 MED ORDER — HEPARIN SOD (PORK) LOCK FLUSH 100 UNIT/ML IV SOLN
500.0000 [IU] | Freq: Once | INTRAVENOUS | Status: AC
  Administered 2023-09-12: 500 [IU]
  Filled 2023-09-12: qty 5

## 2023-09-12 NOTE — ED Notes (Signed)
-  Ptar called for transportation back to home at 100am. 4-5 others ahead of patient.

## 2023-09-14 LAB — URINE CULTURE: Culture: >=100000 — AB

## 2023-09-15 ENCOUNTER — Telehealth (HOSPITAL_BASED_OUTPATIENT_CLINIC_OR_DEPARTMENT_OTHER): Payer: Self-pay

## 2023-09-15 NOTE — Telephone Encounter (Signed)
Post ED Visit - Positive Culture Follow-up  Culture report reviewed by antimicrobial stewardship pharmacist: [facility] []  Enzo Bi, Pharm.D. []  Celedonio Miyamoto, Pharm.D., BCPS AQ-ID []  Garvin Fila, Pharm.D., BCPS []  Georgina Pillion, [address].D., BCPS []  [place], [address].D., BCPS, AAHIVP []  Estella Husk, Pharm.D., BCPS, AAHIVP []  Lysle Pearl, PharmD, BCPS []  Phillips Climes, PharmD, BCPS []  Agapito Games, PharmD, BCPS [x]  Ivery Quale, PharmD []  Mervyn Gay, PharmD, BCPS []  Vinnie Level, PharmD  [facility] []  Len Childs, PharmD []  Greer Pickerel, PharmD []  Adalberto Cole, PharmD []  Perlie Gold, Rph []  Lonell Face) Jean Rosenthal, PharmD []  Earl Many, PharmD []  Junita Push, PharmD []  Dorna Leitz, PharmD []  Terrilee Files, PharmD []  Lynann Beaver, PharmD []  Keturah Barre, PharmD []  Loralee Pacas, PharmD []  Bernadene Person, PharmD   Positive urine culture Treated with Cephalexin, organism sensitive to the same and no further patient follow-up is required at this time.  Sandria Senter 09/15/2023, 9:16 AM

## 2023-09-21 ENCOUNTER — Ambulatory Visit (HOSPITAL_COMMUNITY)
Admission: RE | Admit: 2023-09-21 | Discharge: 2023-09-21 | Disposition: A | Discharge status: Home / Self Care | Payer: 59 | Source: Ambulatory Visit | Attending: Hematology | Admitting: Hematology

## 2023-09-21 ENCOUNTER — Inpatient Hospital Stay: Payer: 59 | Attending: Hematology

## 2023-09-21 VITALS — BP 119/93 | HR 120 | Temp 97.8°F | Resp 20

## 2023-09-21 DIAGNOSIS — D509 Iron deficiency anemia, unspecified: Secondary | ICD-10-CM

## 2023-09-21 DIAGNOSIS — C20 Malignant neoplasm of rectum: Secondary | ICD-10-CM | POA: Insufficient documentation

## 2023-09-21 DIAGNOSIS — Z95828 Presence of other vascular implants and grafts: Secondary | ICD-10-CM

## 2023-09-21 LAB — CBC WITH DIFFERENTIAL/PLATELET
Abs Immature Granulocytes: 0.03 10*3/uL (ref 0.00–0.07)
Basophils Absolute: 0 10*3/uL (ref 0.0–0.1)
Basophils Relative: 0 %
Eosinophils Absolute: 0 10*3/uL (ref 0.0–0.5)
Eosinophils Relative: 0 %
HCT: 43.8 % (ref 36.0–46.0)
Hemoglobin: 14.2 g/dL (ref 12.0–15.0)
Immature Granulocytes: 1 %
Lymphocytes Relative: 9 %
Lymphs Abs: 0.5 10*3/uL — ABNORMAL LOW (ref 0.7–4.0)
MCH: 29 pg (ref 26.0–34.0)
MCHC: 32.4 g/dL (ref 30.0–36.0)
MCV: 89.4 fL (ref 80.0–100.0)
Monocytes Absolute: 0 10*3/uL — ABNORMAL LOW (ref 0.1–1.0)
Monocytes Relative: 1 %
Neutro Abs: 4.8 10*3/uL (ref 1.7–7.7)
Neutrophils Relative %: 89 %
Platelets: 131 10*3/uL — ABNORMAL LOW (ref 150–400)
RBC: 4.9 MIL/uL (ref 3.87–5.11)
RDW: 13.7 % (ref 11.5–15.5)
WBC: 5.4 10*3/uL (ref 4.0–10.5)
nRBC: 0 % (ref 0.0–0.2)

## 2023-09-21 LAB — COMPREHENSIVE METABOLIC PANEL
ALT: 17 U/L (ref 0–44)
AST: 22 U/L (ref 15–41)
Albumin: 3.2 g/dL — ABNORMAL LOW (ref 3.5–5.0)
Alkaline Phosphatase: 91 U/L (ref 38–126)
Anion gap: 11 (ref 5–15)
BUN: 30 mg/dL — ABNORMAL HIGH (ref 8–23)
CO2: 22 mmol/L (ref 22–32)
Calcium: 9.2 mg/dL (ref 8.9–10.3)
Chloride: 97 mmol/L — ABNORMAL LOW (ref 98–111)
Creatinine, Ser: 1.02 mg/dL — ABNORMAL HIGH (ref 0.44–1.00)
GFR, Estimated: >60 mL/min (ref >60)
Glucose, Bld: 397 mg/dL — ABNORMAL HIGH (ref 70–99)
Potassium: 4.5 mmol/L (ref 3.5–5.1)
Sodium: 130 mmol/L — ABNORMAL LOW (ref 135–145)
Total Bilirubin: 0.5 mg/dL (ref 0.3–1.2)
Total Protein: 7.2 g/dL (ref 6.5–8.1)

## 2023-09-21 LAB — IRON AND TIBC
Iron: 54 ug/dL (ref 28–170)
Saturation Ratios: 21 % (ref 10.4–31.8)
TIBC: 258 ug/dL (ref 250–450)
UIBC: 204 ug/dL

## 2023-09-21 LAB — FERRITIN: Ferritin: 235 ng/mL (ref 11–307)

## 2023-09-21 LAB — MAGNESIUM: Magnesium: 1.8 mg/dL (ref 1.7–2.4)

## 2023-09-21 LAB — SAMPLE TO BLOOD BANK

## 2023-09-21 MED ORDER — SODIUM CHLORIDE 0.9% FLUSH
10.0000 mL | Freq: Once | INTRAVENOUS | Status: AC
Start: 2023-09-21 — End: 2023-09-21
  Administered 2023-09-21: 10 mL via INTRAVENOUS

## 2023-09-21 MED ORDER — IOHEXOL 300 MG/ML  SOLN
100.0000 mL | Freq: Once | INTRAMUSCULAR | Status: AC | PRN
  Administered 2023-09-21: 100 mL via INTRAVENOUS

## 2023-09-21 MED ORDER — HEPARIN SOD (PORK) LOCK FLUSH 100 UNIT/ML IV SOLN
500.0000 [IU] | Freq: Once | INTRAVENOUS | Status: AC
  Administered 2023-09-21: 500 [IU] via INTRAVENOUS

## 2023-09-21 NOTE — Patient Instructions (Signed)
[facility]  Discharge Instructions: Thank you for choosing [facility] to provide your oncology and hematology care.  If you have a lab appointment with the Cancer Center - please note that after April 8th, 2024, all labs will be drawn in the cancer center.  You do not have to check in or register with the main entrance as you have in the past but will complete your check-in in the cancer center.  Wear comfortable clothing and clothing appropriate for easy access to any Portacath or PICC line.   We strive to give you quality time with your provider. You may need to reschedule your appointment if you arrive late (15 or more minutes).  Arriving late affects you and other patients whose appointments are after yours.  Also, if you miss three or more appointments without notifying the office, you may be dismissed from the clinic at the provider's discretion.      For prescription refill requests, have your pharmacy contact our office and allow 72 hours for refills to be completed.    Today you received the following: port access and lab work for CT.       To help prevent nausea and vomiting after your treatment, we encourage you to take your nausea medication as directed.  BELOW ARE SYMPTOMS THAT SHOULD BE REPORTED IMMEDIATELY: *FEVER GREATER THAN 100.4 F (38 C) OR HIGHER *CHILLS OR SWEATING *NAUSEA AND VOMITING THAT IS NOT CONTROLLED WITH YOUR NAUSEA MEDICATION *UNUSUAL SHORTNESS OF BREATH *UNUSUAL BRUISING OR BLEEDING *URINARY PROBLEMS (pain or burning when urinating, or frequent urination) *BOWEL PROBLEMS (unusual diarrhea, constipation, pain near the anus) TENDERNESS IN MOUTH AND THROAT WITH OR WITHOUT PRESENCE OF ULCERS (sore throat, sores in mouth, or a toothache) UNUSUAL RASH, SWELLING OR PAIN  UNUSUAL VAGINAL DISCHARGE OR ITCHING   Items with * indicate a potential emergency and should be followed up as soon as possible or go to the Emergency  Department if any problems should occur.  Please show the CHEMOTHERAPY ALERT CARD or IMMUNOTHERAPY ALERT CARD at check-in to the Emergency Department and triage nurse.  Should you have questions after your visit or need to cancel or reschedule your appointment, please contact [facility] AT [facility] [phone]  and follow the prompts.  Office hours are 8:00 a.m. to 4:30 p.m. Monday - Friday. Please note that voicemails left after 4:00 p.m. may not be returned until the following business day.  We are closed weekends and major holidays. You have access to a nurse at all times for urgent questions. Please call the main number to the clinic [phone] and follow the prompts.  For any non-urgent questions, you may also contact your provider using MyChart. We now offer e-Visits for anyone 75 and older to request care online for non-urgent symptoms. For details visit [email or web address].   Also download the MyChart app! Go to the app store, search "MyChart", open the app, select [place], and log in with your MyChart username and password.

## 2023-09-21 NOTE — Progress Notes (Signed)
[name] presented for Portacath access and lab work for CT scan.  Portacath located right chest wall accessed with a Power Port H 20 needle.  Good blood return present. Portacath flushed with 20ml NS. Needle left intact for CT. Procedure tolerated well and without incident. Discharged from clinic by wheel chair to CT in stable condition. Alert and oriented x 3. F/U with [facility] as scheduled.

## 2023-09-28 ENCOUNTER — Inpatient Hospital Stay: Payer: Medicare HMO | Attending: Hematology | Admitting: Hematology

## 2023-09-28 DIAGNOSIS — C20 Malignant neoplasm of rectum: Secondary | ICD-10-CM

## 2023-09-28 NOTE — Progress Notes (Signed)
Virtual Visit via Telephone Note  I connected with [name] on 09/28/23 at  3:15 PM EST by telephone and verified that I am speaking with the correct person using two identifiers.  Location: Patient: At home  Provider: At office   I discussed the limitations, risks, security and privacy concerns of performing an evaluation and management service by telephone and the availability of in person appointments. I also discussed with the patient that there may be a patient responsible charge related to this service. The patient expressed understanding and agreed to proceed.   History of Present Illness: [name] is a 67 y.o. female being called for results of imaging and labs. She has Stage IIc (T3d/4BN0M0) rectal cancer. CT A/P on 09/21/23 found: rectal wall thickening eccentric to the left compatible with tumor; no hepatic metastatic lesion observed; no pathologic adenopathy; complex exophytic 2.7 cm lesion from the right mid kidney posteriorly; lumbar spondylosis and degenerative disc disease causing right greater than left foraminal impingement at L4-5. MRI of the pelvis for rectal staging on 07/30/23 found: rectal adenocarcinoma T stage probable T4, with decreased bulkiness of low rectal tumor compared to prior exam; rectal adenocarcinoma with an N stage of N0; and distance from tumor to the internal anal sphincter is 0 cm.   Observations/Objective: She denies any diarrhea.  Overall she is feeling much better from last visit.  Appetite is 80% and energy levels are 60%.  Assessment and Plan:  1.  Stage IIc rectal cancer, MSI stable: - Pelvic MRI from 07/30/2023: Decreased bulkiness of the low rectal tumor compared to prior exam.  T4 N0. - CT AP (09/21/2023): Rectal wall thickening eccentric to the left compatible with tumor with no metastatic disease.  2.7 cm right mid kidney mass is stable.  Cirrhosis and splenomegaly.  Other benign findings discussed with the patient. - We reviewed labs from  09/21/2023: Albumin improved to 3.2.  CBC was grossly normal with platelet count 131.  Other LFTs are normal.  Ferritin is normal at 235. - As her albumin improved along with her functional status, I have recommended that she follow-up with Dr. Maisie Fus to discuss about surgery option.  Will make a referral. - I will see her back in 3 months with repeat pelvic MRI, rectal cancer protocol.  If she moves forward with surgery, we will cancel the pelvic MRI.  2.  Abdominal pain: - She is not requiring oxycodone on a daily basis.  3.  Hypomagnesemia: - Magnesium is normal.  Continue magnesium 2 tablets twice daily.   Follow Up Instructions: RTC 3 months with scan and labs.   I discussed the assessment and treatment plan with the patient. The patient was provided an opportunity to ask questions and all were answered. The patient agreed with the plan and demonstrated an understanding of the instructions.   The patient was advised to call back or seek an in-person evaluation if the symptoms worsen or if the condition fails to improve as anticipated.  I provided 21  minutes of non-face-to-face time during this encounter.   Alben Deeds Teague,acting as a Neurosurgeon for [name], MD.,have documented all relevant documentation on the behalf of [name], MD,as directed by  [name], MD while in the presence of [name], MD.  I, [name] MD, have reviewed the above documentation for accuracy and completeness, and I agree with the above.   [name], MD

## 2023-09-29 ENCOUNTER — Other Ambulatory Visit: Payer: Self-pay | Admitting: *Deleted

## 2023-09-29 DIAGNOSIS — C20 Malignant neoplasm of rectum: Secondary | ICD-10-CM

## 2023-09-30 ENCOUNTER — Other Ambulatory Visit: Payer: Self-pay | Admitting: *Deleted

## 2023-09-30 MED ORDER — HYDROXYZINE PAMOATE 25 MG PO CAPS: 25.0000 mg | ORAL_CAPSULE | Freq: Two times a day (BID) | ORAL | 0 refills | Status: DC | PRN

## 2023-09-30 NOTE — Telephone Encounter (Signed)
Refill hydroxyzine per last office note.

## 2023-10-11 ENCOUNTER — Emergency Department (HOSPITAL_COMMUNITY)
Admission: EM | Admit: 2023-10-11 | Discharge: 2023-10-11 | Disposition: A | Discharge status: Home / Self Care | Payer: 59 | Attending: Emergency Medicine | Admitting: Emergency Medicine

## 2023-10-11 ENCOUNTER — Other Ambulatory Visit: Payer: Self-pay

## 2023-10-11 ENCOUNTER — Emergency Department (HOSPITAL_COMMUNITY): Payer: 59

## 2023-10-11 DIAGNOSIS — I10 Essential (primary) hypertension: Secondary | ICD-10-CM | POA: Insufficient documentation

## 2023-10-11 DIAGNOSIS — Z79899 Other long term (current) drug therapy: Secondary | ICD-10-CM | POA: Insufficient documentation

## 2023-10-11 DIAGNOSIS — E119 Type 2 diabetes mellitus without complications: Secondary | ICD-10-CM | POA: Insufficient documentation

## 2023-10-11 DIAGNOSIS — I2510 Atherosclerotic heart disease of native coronary artery without angina pectoris: Secondary | ICD-10-CM | POA: Diagnosis not present

## 2023-10-11 DIAGNOSIS — Z794 Long term (current) use of insulin: Secondary | ICD-10-CM | POA: Insufficient documentation

## 2023-10-11 DIAGNOSIS — N3000 Acute cystitis without hematuria: Secondary | ICD-10-CM

## 2023-10-11 DIAGNOSIS — R1030 Lower abdominal pain, unspecified: Secondary | ICD-10-CM | POA: Diagnosis present

## 2023-10-11 DIAGNOSIS — C20 Malignant neoplasm of rectum: Secondary | ICD-10-CM | POA: Insufficient documentation

## 2023-10-11 LAB — COMPREHENSIVE METABOLIC PANEL
ALT: 11 U/L (ref 0–44)
AST: 19 U/L (ref 15–41)
Albumin: 3.3 g/dL — ABNORMAL LOW (ref 3.5–5.0)
Alkaline Phosphatase: 81 U/L (ref 38–126)
Anion gap: 7 (ref 5–15)
BUN: 22 mg/dL (ref 8–23)
CO2: 27 mmol/L (ref 22–32)
Calcium: 9.3 mg/dL (ref 8.9–10.3)
Chloride: 100 mmol/L (ref 98–111)
Creatinine, Ser: 0.94 mg/dL (ref 0.44–1.00)
GFR, Estimated: >60 mL/min (ref >60)
Glucose, Bld: 161 mg/dL — ABNORMAL HIGH (ref 70–99)
Potassium: 4.5 mmol/L (ref 3.5–5.1)
Sodium: 134 mmol/L — ABNORMAL LOW (ref 135–145)
Total Bilirubin: 0.8 mg/dL (ref <1.2)
Total Protein: 6.7 g/dL (ref 6.5–8.1)

## 2023-10-11 LAB — CBC WITH DIFFERENTIAL/PLATELET
Abs Immature Granulocytes: 0.02 10*3/uL (ref 0.00–0.07)
Basophils Absolute: 0 10*3/uL (ref 0.0–0.1)
Basophils Relative: 1 %
Eosinophils Absolute: 0.1 10*3/uL (ref 0.0–0.5)
Eosinophils Relative: 2 %
HCT: 42.9 % (ref 36.0–46.0)
Hemoglobin: 14 g/dL (ref 12.0–15.0)
Immature Granulocytes: 0 %
Lymphocytes Relative: 20 %
Lymphs Abs: 1 10*3/uL (ref 0.7–4.0)
MCH: 28.2 pg (ref 26.0–34.0)
MCHC: 32.6 g/dL (ref 30.0–36.0)
MCV: 86.3 fL (ref 80.0–100.0)
Monocytes Absolute: 0.3 10*3/uL (ref 0.1–1.0)
Monocytes Relative: 7 %
Neutro Abs: 3.4 10*3/uL (ref 1.7–7.7)
Neutrophils Relative %: 70 %
Platelets: 125 10*3/uL — ABNORMAL LOW (ref 150–400)
RBC: 4.97 MIL/uL (ref 3.87–5.11)
RDW: 14.4 % (ref 11.5–15.5)
WBC: 4.9 10*3/uL (ref 4.0–10.5)
nRBC: 0 % (ref 0.0–0.2)

## 2023-10-11 LAB — URINALYSIS, W/ REFLEX TO CULTURE (INFECTION SUSPECTED)
Bilirubin Urine: NEGATIVE
Glucose, UA: NEGATIVE mg/dL
Ketones, ur: NEGATIVE mg/dL
Nitrite: NEGATIVE
Protein, ur: 100 mg/dL — AB
Specific Gravity, Urine: 1.013 (ref 1.005–1.030)
WBC, UA: >50 WBC/hpf (ref 0–5)
pH: 8 (ref 5.0–8.0)

## 2023-10-11 LAB — LIPASE, BLOOD: Lipase: 27 U/L (ref 11–51)

## 2023-10-11 LAB — MAGNESIUM: Magnesium: 1.8 mg/dL (ref 1.7–2.4)

## 2023-10-11 MED ORDER — LACTATED RINGERS IV BOLUS
1000.0000 mL | Freq: Once | INTRAVENOUS | Status: AC
  Administered 2023-10-11: 1000 mL via INTRAVENOUS

## 2023-10-11 MED ORDER — CIPROFLOXACIN IN D5W 400 MG/200ML IV SOLN
400.0000 mg | Freq: Once | INTRAVENOUS | Status: AC
Start: 2023-10-11 — End: 2023-10-11
  Administered 2023-10-11: 400 mg via INTRAVENOUS
  Filled 2023-10-11: qty 200

## 2023-10-11 MED ORDER — FENTANYL CITRATE PF 50 MCG/ML IJ SOSY
50.0000 ug | PREFILLED_SYRINGE | INTRAMUSCULAR | Status: DC | PRN
  Administered 2023-10-11: 50 ug via INTRAVENOUS
  Filled 2023-10-11: qty 1

## 2023-10-11 MED ORDER — CIPROFLOXACIN HCL 500 MG PO TABS: 500.0000 mg | ORAL_TABLET | Freq: Two times a day (BID) | ORAL | 0 refills | Status: DC

## 2023-10-11 NOTE — ED Triage Notes (Signed)
Pt BIB ems, from home, for abdominal pain and needing a new foley catheter, uses a foley chronicly. Dx with rectal cancer.

## 2023-10-11 NOTE — ED Provider Notes (Addendum)
[place] EMERGENCY DEPARTMENT AT [facility] Provider Note   CSN: [id number] Arrival date & time: 10/11/23  2440     History  Chief Complaint  Patient presents with   Abdominal Pain    [name] is a 67 y.o. female.   Abdominal Pain Patient presents for abdominal pain.  Medical history includes HTN, DM, GERD, CAD, anemia, arthritis, and rectal cancer.  Cancer diagnosis was earlier this year.  She has undergone chemo and radiation.  She has not undergone surgery.  She has had a Foley catheter for the past 5 months.  She was seen in the ED a month ago, at which time Foley catheter was exchanged.  She was treated for UTI with Keflex at that time.  Patient had decreased urine output 2 days ago.  Her urine has become dark in color.  She has also experienced acute on chronic lower abdominal/suprapubic pain.  She states that she has had normal daily bowel movements.  She takes oxycodone at home for her chronic pain.  Current pain is 6/10 in severity.     Home Medications Prior to Admission medications   Medication Sig Start Date End Date Taking? Authorizing Provider  cyclobenzaprine (FLEXERIL) 10 MG tablet Take 10 mg by mouth 2 (two) times daily as needed for muscle spasms.   Yes [provider]  dicyclomine (BENTYL) 10 MG capsule Take 1 capsule (10 mg total) by mouth every 8 (eight) hours as needed for spasms. 06/24/23  Yes [name], MD  diphenoxylate-atropine (LOMOTIL) 2.5-0.025 MG tablet Take 1 tablet by mouth 4 (four) times daily. Patient taking differently: Take 2 tablets by mouth 4 (four) times daily. 03/25/23  Yes Pennington, Rebekah M, PA-C  FLUoxetine (PROZAC) 20 MG capsule Take 20 mg by mouth daily.   Yes [provider]  hydrOXYzine (VISTARIL) 25 MG capsule Take 1 capsule (25 mg total) by mouth 2 (two) times daily as needed for anxiety. 09/30/23  Yes [name], MD  levothyroxine (SYNTHROID) 88 MCG tablet TAKE 1 TABLET EVERY DAY  BEFORE BREAKFAST Patient taking differently: Take 88 mcg by mouth daily before breakfast. 04/09/23  Yes Reardon, Freddi Starr, NP  magnesium oxide (MAG-OX) 400 (240 Mg) MG tablet Take 2 tablets (800 mg total) by mouth daily. 05/19/23  Yes [name], MD  oxyCODONE (OXY IR/ROXICODONE) 5 MG immediate release tablet Take 1 tablet (5 mg total) by mouth every 12 (twelve) hours as needed for severe pain (pain score 7-10). 09/07/23  Yes [name], MD  pantoprazole (PROTONIX) 40 MG tablet Take 1 tablet (40 mg total) by mouth 2 (two) times daily before a meal. 04/13/23  Yes Emokpae, Courage, MD  pregabalin (LYRICA) 100 MG capsule Take 100 mg by mouth 2 (two) times daily.   Yes [provider]  QUEtiapine (SEROQUEL) 100 MG tablet TAKE ONE TABLET BY MOUTH AT BEDTIME 08/26/23  Yes [name], MD  blood glucose meter kit and supplies KIT Dispense based on patient and insurance preference. Use up to four times daily as directed. (FOR ICD-10 E11.65) Number of Strips 400 Number of Lancets 400 02/16/23   Roma Kayser, MD  Blood Glucose Monitoring Suppl (TRUE METRIX METER) w/Device KIT 1 each by Does not apply route 2 (two) times daily. Use to test BG bid E11.65 03/19/23   Dani Gobble, NP  Insulin Pen Needle (DROPLET PEN NEEDLES) 31G X 5 MM MISC USE AS INSTRUCTED TO INJECT INSULIN FOUR TIMES DAILY 05/26/23   Ronny Bacon  J, NP  Misc. Devices (ROLLATOR ULTRA-LIGHT) MISC Rollator walker 04/27/23   [provider]  TRUE METRIX BLOOD GLUCOSE TEST test strip TEST BLOOD SUGAR TWICE DAILY 11/10/22   Roma Kayser, MD      Allergies    Contrast media [iodinated contrast media], Sulfa antibiotics, Doxepin, and Tape    Review of Systems   Review of Systems  Gastrointestinal:  Positive for abdominal pain.  Genitourinary:  Positive for decreased urine volume.  All other systems reviewed and are negative.   Physical Exam Updated Vital Signs BP 110/70 (BP  Location: Left Arm)   Pulse 83   Temp 98.2 F (36.8 C) (Oral)   Resp 18   Ht 5\' 8"  (1.727 m)   Wt 113.4 kg   SpO2 96%   BMI 38.01 kg/m  Physical Exam Vitals and nursing note reviewed.  Constitutional:      General: She is not in acute distress.    Appearance: She is well-developed. She is not ill-appearing, toxic-appearing or diaphoretic.  HENT:     Head: Normocephalic and atraumatic.     Mouth/Throat:     Mouth: Mucous membranes are moist.  Eyes:     Conjunctiva/sclera: Conjunctivae normal.  Cardiovascular:     Rate and Rhythm: Normal rate and regular rhythm.  Pulmonary:     Effort: Pulmonary effort is normal. No respiratory distress.  Abdominal:     General: There is no distension.     Palpations: Abdomen is soft.     Tenderness: There is abdominal tenderness in the suprapubic area. There is no guarding or rebound.  Musculoskeletal:        General: No swelling.     Cervical back: Neck supple.  Skin:    General: Skin is warm and dry.     Coloration: Skin is not cyanotic or jaundiced.  Neurological:     General: No focal deficit present.     Mental Status: She is alert and oriented to person, place, and time.  Psychiatric:        Mood and Affect: Mood normal.        Behavior: Behavior normal.     ED Results / Procedures / Treatments   Labs (all labs ordered are listed, but only abnormal results are displayed) Labs Reviewed  COMPREHENSIVE METABOLIC PANEL - Abnormal; Notable for the following components:      Result Value   Sodium 134 (*)    Glucose, Bld 161 (*)    Albumin 3.3 (*)    All other components within normal limits  CBC WITH DIFFERENTIAL/PLATELET - Abnormal; Notable for the following components:   Platelets 125 (*)    All other components within normal limits  URINALYSIS, W/ REFLEX TO CULTURE (INFECTION SUSPECTED) - Abnormal; Notable for the following components:   APPearance TURBID (*)    Hgb urine dipstick MODERATE (*)    Protein, ur 100 (*)     Leukocytes,Ua SMALL (*)    Bacteria, UA MANY (*)    All other components within normal limits  URINE CULTURE  LIPASE, BLOOD  MAGNESIUM    EKG None  Radiology No results found.  Procedures Procedures    Medications Ordered in ED Medications  fentaNYL (SUBLIMAZE) injection 50 mcg (50 mcg Intravenous Given 10/11/23 1122)  ciprofloxacin (CIPRO) IVPB 400 mg (400 mg Intravenous New Bag/Given 10/11/23 1513)  lactated ringers bolus 1,000 mL (1,000 mLs Intravenous Bolus 10/11/23 1122)    ED Course/ Medical Decision Making/ A&P  Medical Decision Making Amount and/or Complexity of Data Reviewed Labs: ordered. Radiology: ordered.  Risk Prescription drug management.   This patient presents to the ED for concern of abdominal pain, this involves an extensive number of treatment options, and is a complaint that carries with it a high risk of complications and morbidity.  The differential diagnosis includes urine retention, UTI, worsening of cancer, colitis, diverticulitis   Co morbidities that complicate the patient evaluation  HTN, DM, GERD, CAD, anemia, arthritis, and rectal cancer   Additional history obtained:  Additional history obtained from N/A External records from outside source obtained and reviewed including EMR   Lab Tests:  I Ordered, and personally interpreted labs.  The pertinent results include: Normal hemoglobin, no leukocytosis, normal kidney function, normal electrolytes.  Urinalysis shows bacteriuria and pyuria consistent with colonization and/or UTI   Imaging Studies ordered:  I ordered imaging studies including CT of abdomen and pelvis I independently visualized and interpreted imaging which showed no acute findings I agree with the radiologist interpretation   Cardiac Monitoring: / EKG:  The patient was maintained on a cardiac monitor.  I personally viewed and interpreted the cardiac monitored which showed an  underlying rhythm of: Sinus rhythm   Problem List / ED Course / Critical interventions / Medication management  Patient presenting for lower abdominal pain in addition to decreased urine output from her Foley catheter.  On arrival in the ED, vital signs are normal.  Patient is alert and oriented.  She has tenderness present in the lower abdomen.  Current pain is 6/10 in severity.  She declines any pain medication at this time.  As needed pain medication was ordered.  Patient to undergo Foley catheter exchange, lab work, and imaging studies.  Serum lab work was unremarkable.  Urinalysis does show pyuria and bacteria.  Given indwelling Foley catheter, it is unclear if this is secondary to colonization or infection.  Patient was treated empirically for UTI.  Dose of 0 ofloxacin was ordered.  CT scan showed no acute findings.  Patient was prescribed ongoing antibiotics for empiric treatment of UTI.  She was discharged in stable condition. I ordered medication including IV fluid for hydration; fentanyl for analgesia; ciprofloxacin for empiric treatment of UTI Reevaluation of the patient after these medicines showed that the patient improved I have reviewed the patients home medicines and have made adjustments as needed   Social Determinants of Health:  Has access to outpatient care         Final Clinical Impression(s) / ED Diagnoses Final diagnoses:  Lower abdominal pain    Rx / DC Orders ED Discharge Orders     None         Gloris Manchester, MD 10/11/23 1527    Gloris Manchester, MD 10/11/23 [phone]

## 2023-10-11 NOTE — ED Notes (Signed)
Pt came in with a foley catheter prior to this ED visit, foley appears to be contaminated upon inspection and discolored, new 14 Fr foley catheter placed, urine return noted. Peri care performed before new foley insertion

## 2023-10-11 NOTE — ED Notes (Signed)
Pt requests to be taken back by EMS as she "has been in the bed for 3 weeks, I cannot stand and I cannot feel when I stand because I have really bad neuropathy" pt place on list to go back home, pts daughter, April, made aware

## 2023-10-11 NOTE — ED Notes (Signed)
Pt states she is allergic to CT contrast and and typically premedicated before she receives IV contrast for CT scan--MD made aware

## 2023-10-11 NOTE — Discharge Instructions (Signed)
Take the antibiotics as prescribed.  Follow up with your doctor to be rechecked.  Return for fevers, chills, worsening symptoms.

## 2023-10-11 NOTE — ED Provider Notes (Signed)
Pt seen by Dr Durwin Nora.  Please see his note.  Plan was to follow up on CT scan.  CT without acute process.  Pt being treated for possible uti.  Will dc home on abx, started on cipro in the ED.  Pt comfortable with plan and discharge.   Linwood Dibbles, MD 10/11/23 [phone]

## 2023-10-11 NOTE — ED Notes (Signed)
Transport at bedside to take pt back home.

## 2023-10-13 LAB — URINE CULTURE: Culture: >=100000 — AB

## 2023-10-14 ENCOUNTER — Telehealth (HOSPITAL_BASED_OUTPATIENT_CLINIC_OR_DEPARTMENT_OTHER): Payer: Self-pay | Admitting: *Deleted

## 2023-10-14 ENCOUNTER — Other Ambulatory Visit: Payer: Self-pay | Admitting: *Deleted

## 2023-10-14 ENCOUNTER — Other Ambulatory Visit: Payer: Self-pay | Admitting: Hematology

## 2023-10-14 MED ORDER — OXYCODONE HCL 5 MG PO TABS: 5.0000 mg | ORAL_TABLET | Freq: Two times a day (BID) | ORAL | 0 refills | Status: DC | PRN

## 2023-10-14 NOTE — Telephone Encounter (Signed)
Post ED Visit - Positive Culture Follow-up  Culture report reviewed by antimicrobial stewardship pharmacist: [facility] [x]  Lennie Muckle, Pharm.D. []  Celedonio Miyamoto, Pharm.D., BCPS AQ-ID []  Garvin Fila, Pharm.D., BCPS []  Georgina Pillion, Pharm.D., BCPS []  [place], [address].D., BCPS, AAHIVP []  Estella Husk, Pharm.D., BCPS, AAHIVP []  Lysle Pearl, PharmD, BCPS []  Phillips Climes, PharmD, BCPS []  Agapito Games, PharmD, BCPS []  Verlan Friends, PharmD []  Mervyn Gay, PharmD, BCPS []  Vinnie Level, PharmD  [facility] []  Len Childs, PharmD []  Greer Pickerel, PharmD []  Adalberto Cole, PharmD []  Perlie Gold, Rph []  Lonell Face) Jean Rosenthal, PharmD []  Earl Many, PharmD []  Junita Push, PharmD []  Dorna Leitz, PharmD []  Terrilee Files, PharmD []  Lynann Beaver, PharmD []  Keturah Barre, PharmD []  Loralee Pacas, PharmD []  Bernadene Person, PharmD   Positive urine culture Treated with Cipro, organism sensitive to the same and no further patient follow-up is required at this time.  Virl Axe [facility] 10/14/2023, 10:19 AM

## 2023-10-28 ENCOUNTER — Other Ambulatory Visit: Payer: Self-pay | Admitting: Hematology

## 2023-11-11 ENCOUNTER — Other Ambulatory Visit: Payer: Self-pay | Admitting: *Deleted

## 2023-11-11 MED ORDER — OXYCODONE HCL 5 MG PO TABS: 5.0000 mg | ORAL_TABLET | Freq: Two times a day (BID) | ORAL | 0 refills | Status: DC | PRN

## 2023-11-18 ENCOUNTER — Other Ambulatory Visit: Payer: Self-pay

## 2023-11-18 DIAGNOSIS — E890 Postprocedural hypothyroidism: Secondary | ICD-10-CM

## 2023-11-18 MED ORDER — LEVOTHYROXINE SODIUM 88 MCG PO TABS: 88.0000 ug | ORAL_TABLET | Freq: Every day | ORAL | 0 refills | Status: DC

## 2023-11-20 ENCOUNTER — Encounter (HOSPITAL_COMMUNITY): Payer: Self-pay

## 2023-11-20 ENCOUNTER — Emergency Department (HOSPITAL_COMMUNITY): Payer: 59

## 2023-11-20 ENCOUNTER — Other Ambulatory Visit: Payer: Self-pay

## 2023-11-20 ENCOUNTER — Emergency Department (HOSPITAL_COMMUNITY)
Admission: EM | Admit: 2023-11-20 | Discharge: 2023-11-20 | Disposition: A | Discharge status: Home / Self Care | Payer: 59 | Attending: Emergency Medicine | Admitting: Emergency Medicine

## 2023-11-20 DIAGNOSIS — E119 Type 2 diabetes mellitus without complications: Secondary | ICD-10-CM | POA: Diagnosis not present

## 2023-11-20 DIAGNOSIS — N390 Urinary tract infection, site not specified: Secondary | ICD-10-CM | POA: Diagnosis not present

## 2023-11-20 DIAGNOSIS — Z85048 Personal history of other malignant neoplasm of rectum, rectosigmoid junction, and anus: Secondary | ICD-10-CM | POA: Insufficient documentation

## 2023-11-20 DIAGNOSIS — R82998 Other abnormal findings in urine: Secondary | ICD-10-CM | POA: Diagnosis present

## 2023-11-20 DIAGNOSIS — Z794 Long term (current) use of insulin: Secondary | ICD-10-CM | POA: Diagnosis not present

## 2023-11-20 DIAGNOSIS — R791 Abnormal coagulation profile: Secondary | ICD-10-CM | POA: Insufficient documentation

## 2023-11-20 DIAGNOSIS — R531 Weakness: Secondary | ICD-10-CM | POA: Insufficient documentation

## 2023-11-20 DIAGNOSIS — Z20822 Contact with and (suspected) exposure to covid-19: Secondary | ICD-10-CM | POA: Diagnosis not present

## 2023-11-20 DIAGNOSIS — B9689 Other specified bacterial agents as the cause of diseases classified elsewhere: Secondary | ICD-10-CM | POA: Diagnosis not present

## 2023-11-20 LAB — URINALYSIS, W/ REFLEX TO CULTURE (INFECTION SUSPECTED)
Bacteria, UA: NONE SEEN
Bilirubin Urine: NEGATIVE
Glucose, UA: NEGATIVE mg/dL
Ketones, ur: NEGATIVE mg/dL
Nitrite: NEGATIVE
Protein, ur: 100 mg/dL — AB
Specific Gravity, Urine: 1.012 (ref 1.005–1.030)
WBC, UA: >50 WBC/hpf (ref 0–5)
pH: 5 (ref 5.0–8.0)

## 2023-11-20 LAB — COMPREHENSIVE METABOLIC PANEL
ALT: 11 U/L (ref 0–44)
AST: 17 U/L (ref 15–41)
Albumin: 2.8 g/dL — ABNORMAL LOW (ref 3.5–5.0)
Alkaline Phosphatase: 84 U/L (ref 38–126)
Anion gap: 5 (ref 5–15)
BUN: 24 mg/dL — ABNORMAL HIGH (ref 8–23)
CO2: 28 mmol/L (ref 22–32)
Calcium: 8.7 mg/dL — ABNORMAL LOW (ref 8.9–10.3)
Chloride: 102 mmol/L (ref 98–111)
Creatinine, Ser: 0.67 mg/dL (ref 0.44–1.00)
GFR, Estimated: >60 mL/min (ref >60)
Glucose, Bld: 166 mg/dL — ABNORMAL HIGH (ref 70–99)
Potassium: 3.9 mmol/L (ref 3.5–5.1)
Sodium: 135 mmol/L (ref 135–145)
Total Bilirubin: 0.7 mg/dL (ref <1.2)
Total Protein: 5.8 g/dL — ABNORMAL LOW (ref 6.5–8.1)

## 2023-11-20 LAB — CBC WITH DIFFERENTIAL/PLATELET
Abs Immature Granulocytes: 0.01 10*3/uL (ref 0.00–0.07)
Basophils Absolute: 0 10*3/uL (ref 0.0–0.1)
Basophils Relative: 1 %
Eosinophils Absolute: 0.1 10*3/uL (ref 0.0–0.5)
Eosinophils Relative: 2 %
HCT: 38.2 % (ref 36.0–46.0)
Hemoglobin: 12.2 g/dL (ref 12.0–15.0)
Immature Granulocytes: 0 %
Lymphocytes Relative: 26 %
Lymphs Abs: 1.1 10*3/uL (ref 0.7–4.0)
MCH: 27.4 pg (ref 26.0–34.0)
MCHC: 31.9 g/dL (ref 30.0–36.0)
MCV: 85.8 fL (ref 80.0–100.0)
Monocytes Absolute: 0.2 10*3/uL (ref 0.1–1.0)
Monocytes Relative: 6 %
Neutro Abs: 2.6 10*3/uL (ref 1.7–7.7)
Neutrophils Relative %: 65 %
Platelets: 123 10*3/uL — ABNORMAL LOW (ref 150–400)
RBC: 4.45 MIL/uL (ref 3.87–5.11)
RDW: 15.7 % — ABNORMAL HIGH (ref 11.5–15.5)
WBC: 4 10*3/uL (ref 4.0–10.5)
nRBC: 0 % (ref 0.0–0.2)

## 2023-11-20 LAB — PROTIME-INR
INR: 1.1 (ref 0.8–1.2)
Prothrombin Time: 14 s (ref 11.4–15.2)

## 2023-11-20 LAB — LACTIC ACID, PLASMA: Lactic Acid, Venous: 1.1 mmol/L (ref 0.5–1.9)

## 2023-11-20 LAB — RESP PANEL BY RT-PCR (RSV, FLU A&B, COVID)  RVPGX2
Influenza A by PCR: NEGATIVE
Influenza B by PCR: NEGATIVE
Resp Syncytial Virus by PCR: NEGATIVE
SARS Coronavirus 2 by RT PCR: NEGATIVE

## 2023-11-20 LAB — APTT: aPTT: 29 s (ref 24–36)

## 2023-11-20 MED ORDER — CEFDINIR 300 MG PO CAPS: 300.0000 mg | ORAL_CAPSULE | Freq: Two times a day (BID) | ORAL | 0 refills | Status: AC

## 2023-11-20 MED ORDER — HEPARIN SOD (PORK) LOCK FLUSH 100 UNIT/ML IV SOLN
500.0000 [IU] | Freq: Once | INTRAVENOUS | Status: AC
  Administered 2023-11-20: 500 [IU]
  Filled 2023-11-20: qty 5

## 2023-11-20 MED ORDER — SODIUM CHLORIDE 0.9 % IV SOLN
2.0000 g | Freq: Once | INTRAVENOUS | Status: AC
  Administered 2023-11-20: 2 g via INTRAVENOUS
  Filled 2023-11-20: qty 12.5

## 2023-11-20 NOTE — Discharge Instructions (Addendum)
We have discussed your results including the presence of a urinary tract infection.  We were able to change her catheter and start an antibiotic.  At this time you have requested discharge and I think that is very reasonable but I want you to come back if you get worse with increasing pain, nausea, fever, weakness or any other worsening symptoms.  You should be seen by your doctor to talk about your results within 2 or 3 days.  Emergency department for severe or worsening symptoms

## 2023-11-20 NOTE — Progress Notes (Signed)
Elink following for sepsis protocol. 

## 2023-11-20 NOTE — Consult Note (Signed)
CODE SEPSIS - PHARMACY COMMUNICATION  **Broad-spectrum antimicrobials should be administered within one hour of sepsis diagnosis**  Time Code Sepsis call or page was received: 1235  Antibiotics ordered: Cefepime  Time of first antibiotic administration: 1316  Additional action taken by pharmacy: N/A  If necessary, name of provider/nurse contacted: N/A    Will M. Dareen Piano, PharmD Clinical Pharmacist 11/20/2023 2:14 PM

## 2023-11-20 NOTE — ED Triage Notes (Signed)
Pt BIB ems for UTI (pt had placed in November with no home care.) Upon assessment EMS notified pt had hypotension as well. During triage assessment pt has dry stool on skin, yeast, skin ulcers on bottom, catheter care has not been performed. When questioning pt about why no catheter care or if she had assistance at home. Pt states family trys to assist in her care but she has no home health because she changed insurance and they denied her assistance.

## 2023-11-20 NOTE — ED Notes (Addendum)
All labs including Blood cultures were collect by 1300 and antibiotics started at 1316.

## 2023-11-20 NOTE — ED Provider Notes (Signed)
[place] EMERGENCY DEPARTMENT AT [facility] Provider Note   CSN: [id number] Arrival date & time: 11/20/23  1214     History  Chief Complaint  Patient presents with   Code Sepsis    [name] is a 67 y.o. female.  HPI   42 female, this very sweet but unfortunate patient has a history of rectal cancer and has decided to withdraw herself from treatment secondary to the side effects of getting the chemotherapy.  She does have a history of diabetes, she has an indwelling Foley catheter and she is no longer ambulatory.  She is cared for by family at home, she reports that overnight she developed some weakness and noticed that the urine in her Foley catheter bag was purple, she has not had a fever but the paramedics did find her to be 80/50 with regards to her blood pressure.  She was not tachycardic she has not been vomiting, she denies abdominal pain chest pain or shortness of breath.  Home Medications Prior to Admission medications   Medication Sig Start Date End Date Taking? Authorizing Provider  cefdinir (OMNICEF) 300 MG capsule Take 1 capsule (300 mg total) by mouth 2 (two) times daily for 10 days. 11/20/23 11/30/23 Yes Eber Hong, MD  blood glucose meter kit and supplies KIT Dispense based on patient and insurance preference. Use up to four times daily as directed. (FOR ICD-10 E11.65) Number of Strips 400 Number of Lancets 400 02/16/23   Roma Kayser, MD  Blood Glucose Monitoring Suppl (TRUE METRIX METER) w/Device KIT 1 each by Does not apply route 2 (two) times daily. Use to test BG bid E11.65 03/19/23   Dani Gobble, NP  ciprofloxacin (CIPRO) 500 MG tablet Take 1 tablet (500 mg total) by mouth 2 (two) times daily. 10/11/23   Linwood Dibbles, MD  cyclobenzaprine (FLEXERIL) 10 MG tablet Take 10 mg by mouth 2 (two) times daily as needed for muscle spasms.    [provider]  dicyclomine (BENTYL) 10 MG capsule Take 1 capsule (10 mg total) by mouth every  8 (eight) hours as needed for spasms. 06/24/23   [name], MD  diphenoxylate-atropine (LOMOTIL) 2.5-0.025 MG tablet Take 1 tablet by mouth 4 (four) times daily. Patient taking differently: Take 2 tablets by mouth 4 (four) times daily. 03/25/23   Carnella Guadalajara, PA-C  FLUoxetine (PROZAC) 20 MG capsule Take 20 mg by mouth daily.    [provider]  hydrOXYzine (VISTARIL) 25 MG capsule Take 1 capsule (25 mg total) by mouth 2 (two) times daily as needed for anxiety. 10/28/23   [name], MD  Insulin Pen Needle (DROPLET PEN NEEDLES) 31G X 5 MM MISC USE AS INSTRUCTED TO INJECT INSULIN FOUR TIMES DAILY 05/26/23   Dani Gobble, NP  levothyroxine (SYNTHROID) 88 MCG tablet Take 1 tablet (88 mcg total) by mouth daily before breakfast. TAKE 1 TABLET EVERY DAY BEFORE BREAKFAST 11/18/23   Dani Gobble, NP  magnesium oxide (MAG-OX) 400 (240 Mg) MG tablet Take 2 tablets (800 mg total) by mouth daily. 05/19/23   [name], MD  Misc. Devices (ROLLATOR ULTRA-LIGHT) MISC Rollator walker 04/27/23   [provider]  oxyCODONE (OXY IR/ROXICODONE) 5 MG immediate release tablet Take 1 tablet (5 mg total) by mouth every 12 (twelve) hours as needed for severe pain (pain score 7-10). 11/11/23   [name], MD  pantoprazole (PROTONIX) 40 MG tablet Take 1 tablet (40 mg total) by mouth 2 (two)  times daily before a meal. 04/13/23   Emokpae, Courage, MD  pregabalin (LYRICA) 100 MG capsule Take 100 mg by mouth 2 (two) times daily.    [provider]  QUEtiapine (SEROQUEL) 100 MG tablet TAKE ONE TABLET BY MOUTH AT BEDTIME 10/14/23   [name], MD  TRUE METRIX BLOOD GLUCOSE TEST test strip TEST BLOOD SUGAR TWICE DAILY 11/10/22   Roma Kayser, MD      Allergies    Contrast media [iodinated contrast media], Sulfa antibiotics, Doxepin, and Tape    Review of Systems   Review of Systems  All other systems reviewed and are  negative.   Physical Exam Updated Vital Signs BP 99/63   Pulse 80   Temp 98.1 F (36.7 C) (Oral)   Resp 17   Ht 1.727 m (5\' 8" )   Wt 113.4 kg   SpO2 95%   BMI 38.01 kg/m  Physical Exam Vitals and nursing note reviewed.  Constitutional:      General: She is not in acute distress.    Appearance: She is well-developed.  HENT:     Head: Normocephalic and atraumatic.     Mouth/Throat:     Pharynx: No oropharyngeal exudate.  Eyes:     General: No scleral icterus.       Right eye: No discharge.        Left eye: No discharge.     Conjunctiva/sclera: Conjunctivae normal.     Pupils: Pupils are equal, round, and reactive to light.  Neck:     Thyroid: No thyromegaly.     Vascular: No JVD.  Cardiovascular:     Rate and Rhythm: Normal rate and regular rhythm.     Heart sounds: Normal heart sounds. No murmur heard.    No friction rub. No gallop.  Pulmonary:     Effort: Pulmonary effort is normal. No respiratory distress.     Breath sounds: Normal breath sounds. No wheezing or rales.  Abdominal:     General: Bowel sounds are normal. There is no distension.     Palpations: Abdomen is soft. There is no mass.     Tenderness: There is no abdominal tenderness.  Musculoskeletal:        General: Normal range of motion.     Cervical back: Normal range of motion and neck supple.     Right lower leg: Edema present.     Left lower leg: Edema present.  Lymphadenopathy:     Cervical: No cervical adenopathy (.milm).  Skin:    General: Skin is warm and dry.     Findings: No erythema or rash.     Comments: There is some skin breakdown under the diaper area with some decubitus ulcers which are superficial and shallow, no signs of cellulitis  Neurological:     Mental Status: She is alert.     Coordination: Coordination normal.     Comments: Mental status is awake and alert and able to follow commands with her arms, she does have some difficulty with left hand because of neuropathy but can  move her right hand very well, she has weakness in both of her legs but is able to assist rolling from side-to-side  Psychiatric:        Behavior: Behavior normal.     ED Results / Procedures / Treatments   Labs (all labs ordered are listed, but only abnormal results are displayed) Labs Reviewed  COMPREHENSIVE METABOLIC PANEL - Abnormal; Notable for the following components:  Result Value   Glucose, Bld 166 (*)    BUN 24 (*)    Calcium 8.7 (*)    Total Protein 5.8 (*)    Albumin 2.8 (*)    All other components within normal limits  CBC WITH DIFFERENTIAL/PLATELET - Abnormal; Notable for the following components:   RDW 15.7 (*)    Platelets 123 (*)    All other components within normal limits  URINALYSIS, W/ REFLEX TO CULTURE (INFECTION SUSPECTED) - Abnormal; Notable for the following components:   APPearance HAZY (*)    Hgb urine dipstick SMALL (*)    Protein, ur 100 (*)    Leukocytes,Ua MODERATE (*)    All other components within normal limits  RESP PANEL BY RT-PCR (RSV, FLU A&B, COVID)  [id number]  CULTURE, BLOOD (ROUTINE X 2)  CULTURE, BLOOD (ROUTINE X 2)  URINE CULTURE  LACTIC ACID, PLASMA  PROTIME-INR  APTT    EKG EKG Interpretation Date/Time:  Saturday November 20 2023 12:44:42 EST Ventricular Rate:  77 PR Interval:  137 QRS Duration:  97 QT Interval:  407 QTC Calculation: 461 R Axis:   207  Text Interpretation: Sinus rhythm Right axis deviation Low voltage, precordial leads Confirmed by Eber Hong ([id number]) on 11/20/2023 12:54:35 PM  Radiology DG Chest Port 1 View Result Date: 11/20/2023 CLINICAL DATA:  Hypotension and sepsis. EXAM: PORTABLE CHEST 1 VIEW COMPARISON:  Chest radiograph dated 06/01/2023 FINDINGS: Patient is rotated to the left. Right chest wall port projects over the medial left chest and tip projects over the SVC. Low lung volumes. No focal consolidations. No pleural effusion or pneumothorax. The heart size and mediastinal contours are within  normal limits. No acute osseous abnormality. IMPRESSION: Low lung volumes. No focal consolidations. Electronically Signed   By: Agustin Cree M.D.   On: 11/20/2023 14:46    Procedures Procedures    Medications Ordered in ED Medications  ceFEPIme (MAXIPIME) 2 g in sodium chloride 0.9 % 100 mL IVPB (0 g Intravenous Stopped 11/20/23 1430)    ED Course/ Medical Decision Making/ A&P Clinical Course as of 11/20/23 1507  Sat Nov 20, 2023  1313 Plan is to change Foley catheter and obtain urine from clean Foley [BM]    Clinical Course User Index [BM] Eber Hong, MD                                 Medical Decision Making Amount and/or Complexity of Data Reviewed Labs: ordered. Radiology: ordered.    This patient presents to the ED for concern of urinary abnormalities and hypotension, this involves an extensive number of treatment options, and is a complaint that carries with it a high risk of complications and morbidity.  The differential diagnosis includes sepsis, cystitis, chronic colonization, could be related to pyelonephritis or bacteremia   Co morbidities that complicate the patient evaluation  Chronic weakness, known cancer metastatic   Additional history obtained:  Additional history obtained from medical record External records from outside source obtained and reviewed including prior notes from ER visits about a month ago for lower abdominal pain, she is followed by family medicine at [facility] because of chronic Foley catheter, frequent falls and weakness.  She has no recent admissions to the hospital since July 2024 when she was admitted because of a fall with associated concussion hypokalemia and persistent nausea and vomiting.   Lab Tests:  I Ordered, and personally interpreted labs.  The pertinent results include: Urinalysis with signs of lots of white blood cells, there was no leukocytosis, lactate was normal, there is no anemia, her kidney function is  normal, her sodium and potassium are normal, her lactate was 1.1, blood cultures were drawn but pending   Imaging Studies ordered:  I ordered imaging studies including chest x-ray I independently visualized and interpreted imaging which showed low lung volumes but no consolidations or pneumothorax I agree with the radiologist interpretation   Cardiac Monitoring: / EKG:  The patient was maintained on a cardiac monitor.  I personally viewed and interpreted the cardiac monitored which showed an underlying rhythm of: Normal sinus rhythm, no tachycardia, the patient remained normotensive throughout and without any fluid resuscitation her blood pressure is 105 systolic on repeat evaluation   Consultations Obtained:  No consultations   Problem List / ED Course / Critical interventions / Medication management  I reexamined the patient several times and she continued to have a totally normal mental status normal level of alertness and stated that she wanted to go home, I think that is reasonable as I do not see any signs of sepsis.  She had some transient hypotension which did not require IV fluids and I think it is most likely positional given that the patient is morbidly obese and difficult to position on the bed.  She received antibiotics, she is agreeable to return should symptoms worsen, she understands that she may have bacteremia but given the myriad of positive findings on her workup I doubt that she is septic.  I also discussed the care with her daughter by phone, April, she is agreeable to keep a close eye on her and make sure that she brings her back if anything worsens. I ordered medication including antibiotics for urinary tract infection Reevaluation of the patient after these medicines showed that the patient significant improvement, the patient is asymptomatic at the time of discharge I have reviewed the patients home medicines and have made adjustments as needed   Social  Determinants of Health:  Metastatic cancer, not in current treatment, very immobile   Test / Admission - Considered:  Consider admission but the patient does not want, she wants to go home, I think this is reasonable and she understands indications for return         Final Clinical Impression(s) / ED Diagnoses Final diagnoses:  Urinary tract infection without hematuria, site unspecified    Rx / DC Orders ED Discharge Orders          Ordered    cefdinir (OMNICEF) 300 MG capsule  2 times daily        11/20/23 1506              Eber Hong, MD 11/20/23 1507

## 2023-11-20 NOTE — ED Notes (Signed)
[facility] called to request transportation.

## 2023-11-20 NOTE — ED Notes (Signed)
Pt reports she needs the ambulance to take her back home because she can not walk or ride in a wheelchair.

## 2023-11-21 ENCOUNTER — Encounter (HOSPITAL_COMMUNITY): Payer: Self-pay | Admitting: *Deleted

## 2023-11-21 ENCOUNTER — Other Ambulatory Visit: Payer: Self-pay

## 2023-11-21 ENCOUNTER — Emergency Department (HOSPITAL_COMMUNITY)
Admission: EM | Admit: 2023-11-21 | Discharge: 2023-11-22 | Disposition: A | Discharge status: Home / Self Care | Payer: 59 | Attending: Emergency Medicine | Admitting: Emergency Medicine

## 2023-11-21 DIAGNOSIS — R7881 Bacteremia: Secondary | ICD-10-CM | POA: Insufficient documentation

## 2023-11-21 DIAGNOSIS — E119 Type 2 diabetes mellitus without complications: Secondary | ICD-10-CM | POA: Insufficient documentation

## 2023-11-21 DIAGNOSIS — Z79899 Other long term (current) drug therapy: Secondary | ICD-10-CM | POA: Insufficient documentation

## 2023-11-21 DIAGNOSIS — Z794 Long term (current) use of insulin: Secondary | ICD-10-CM | POA: Insufficient documentation

## 2023-11-21 DIAGNOSIS — R531 Weakness: Secondary | ICD-10-CM | POA: Diagnosis present

## 2023-11-21 DIAGNOSIS — Z85040 Personal history of malignant carcinoid tumor of rectum: Secondary | ICD-10-CM | POA: Diagnosis not present

## 2023-11-21 LAB — URINALYSIS, ROUTINE W REFLEX MICROSCOPIC
Bacteria, UA: NONE SEEN
Bilirubin Urine: NEGATIVE
Glucose, UA: NEGATIVE mg/dL
Ketones, ur: NEGATIVE mg/dL
Nitrite: NEGATIVE
Protein, ur: NEGATIVE mg/dL
Specific Gravity, Urine: 1.013 (ref 1.005–1.030)
pH: 5 (ref 5.0–8.0)

## 2023-11-21 LAB — CBC WITH DIFFERENTIAL/PLATELET
Abs Immature Granulocytes: 0.01 10*3/uL (ref 0.00–0.07)
Basophils Absolute: 0 10*3/uL (ref 0.0–0.1)
Basophils Relative: 1 %
Eosinophils Absolute: 0.1 10*3/uL (ref 0.0–0.5)
Eosinophils Relative: 2 %
HCT: 38 % (ref 36.0–46.0)
Hemoglobin: 12.1 g/dL (ref 12.0–15.0)
Immature Granulocytes: 0 %
Lymphocytes Relative: 20 %
Lymphs Abs: 0.8 10*3/uL (ref 0.7–4.0)
MCH: 27.2 pg (ref 26.0–34.0)
MCHC: 31.8 g/dL (ref 30.0–36.0)
MCV: 85.4 fL (ref 80.0–100.0)
Monocytes Absolute: 0.3 10*3/uL (ref 0.1–1.0)
Monocytes Relative: 7 %
Neutro Abs: 2.8 10*3/uL (ref 1.7–7.7)
Neutrophils Relative %: 70 %
Platelets: 101 10*3/uL — ABNORMAL LOW (ref 150–400)
RBC: 4.45 MIL/uL (ref 3.87–5.11)
RDW: 15.7 % — ABNORMAL HIGH (ref 11.5–15.5)
WBC: 4 10*3/uL (ref 4.0–10.5)
nRBC: 0 % (ref 0.0–0.2)

## 2023-11-21 LAB — LACTIC ACID, PLASMA
Lactic Acid, Venous: 0.9 mmol/L (ref 0.5–1.9)
Lactic Acid, Venous: 0.8 mmol/L (ref 0.5–1.9)

## 2023-11-21 LAB — BASIC METABOLIC PANEL
Anion gap: 8 (ref 5–15)
BUN: 25 mg/dL — ABNORMAL HIGH (ref 8–23)
CO2: 25 mmol/L (ref 22–32)
Calcium: 8.8 mg/dL — ABNORMAL LOW (ref 8.9–10.3)
Chloride: 103 mmol/L (ref 98–111)
Creatinine, Ser: 0.69 mg/dL (ref 0.44–1.00)
GFR, Estimated: >60 mL/min (ref >60)
Glucose, Bld: 230 mg/dL — ABNORMAL HIGH (ref 70–99)
Potassium: 4.4 mmol/L (ref 3.5–5.1)
Sodium: 136 mmol/L (ref 135–145)

## 2023-11-21 MED ORDER — OXYCODONE HCL 5 MG PO TABS
5.0000 mg | ORAL_TABLET | ORAL | Status: AC
  Administered 2023-11-21: 5 mg via ORAL
  Filled 2023-11-21: qty 1

## 2023-11-21 NOTE — ED Triage Notes (Signed)
Pt brought in by [facility] from home after she received a call today informing her to come back due to positive blood cultures. VS WNL for EMS.

## 2023-11-21 NOTE — ED Provider Notes (Cosign Needed)
[place] EMERGENCY DEPARTMENT AT [facility] Provider Note   CSN: [id number] Arrival date & time: 11/21/23  1353     History  No chief complaint on file.   [name] is a 67 y.o. female with a history of rectal cancer who is withdrawn from treatment secondary to chemotherapy side effects, also has a history of diabetes and an indwelling Foley catheter was seen here yesterday for some generalized weakness and a dark purple urine in her Foley catheter bag.  She had no fevers but was found to have a low blood pressure yesterday prior to being transported here.  She had a sepsis workup yesterday including blood cultures, but was sent home after her lactic acids were normal and she was stable during yesterday's visit.  She was sent home with antibiotics for UTI.  She was brought back in today secondary to having E. coli on her preliminary blood culture results.  She denies having any fevers, although she does have some general weakness she states she feels improved after receiving IV fluids here yesterday.  Also her urine has cleared and she is taking the antibiotic she was prescribed.  She denies fevers, nausea or vomiting and has had a fair appetite.  The history is provided by the patient and medical records.       Home Medications Prior to Admission medications   Medication Sig Start Date End Date Taking? Authorizing Provider  cyclobenzaprine (FLEXERIL) 10 MG tablet Take 10 mg by mouth 2 (two) times daily as needed for muscle spasms.   Yes [provider]  FLUoxetine (PROZAC) 20 MG capsule Take 20 mg by mouth daily.   Yes [provider]  hydrOXYzine (VISTARIL) 25 MG capsule Take 1 capsule (25 mg total) by mouth 2 (two) times daily as needed for anxiety. 10/28/23  Yes [name], MD  levothyroxine (SYNTHROID) 88 MCG tablet Take 1 tablet (88 mcg total) by mouth daily before breakfast. TAKE 1 TABLET EVERY DAY BEFORE BREAKFAST 11/18/23  Yes Ronny Bacon J, NP  magnesium oxide (MAG-OX) 400 (240 Mg) MG tablet Take 2 tablets (800 mg total) by mouth daily. 05/19/23  Yes [name], MD  oxyCODONE (OXY IR/ROXICODONE) 5 MG immediate release tablet Take 1 tablet (5 mg total) by mouth every 12 (twelve) hours as needed for severe pain (pain score 7-10). 11/11/23  Yes [name], MD  pantoprazole (PROTONIX) 40 MG tablet Take 1 tablet (40 mg total) by mouth 2 (two) times daily before a meal. 04/13/23  Yes Emokpae, Courage, MD  pregabalin (LYRICA) 100 MG capsule Take 100 mg by mouth 2 (two) times daily.   Yes [provider]  QUEtiapine (SEROQUEL) 100 MG tablet TAKE ONE TABLET BY MOUTH AT BEDTIME 10/14/23  Yes [name], MD  blood glucose meter kit and supplies KIT Dispense based on patient and insurance preference. Use up to four times daily as directed. (FOR ICD-10 E11.65) Number of Strips 400 Number of Lancets 400 02/16/23   Roma Kayser, MD  Blood Glucose Monitoring Suppl (TRUE METRIX METER) w/Device KIT 1 each by Does not apply route 2 (two) times daily. Use to test BG bid E11.65 03/19/23   Dani Gobble, NP  cefdinir (OMNICEF) 300 MG capsule Take 1 capsule (300 mg total) by mouth 2 (two) times daily for 10 days. Patient not taking: Reported on 11/21/2023 11/20/23 11/30/23  Eber Hong, MD  ciprofloxacin (CIPRO) 500 MG tablet Take 1 tablet (500 mg total) by mouth 2 (  two) times daily. Patient not taking: Reported on 11/21/2023 10/11/23   Linwood Dibbles, MD  dicyclomine (BENTYL) 10 MG capsule Take 1 capsule (10 mg total) by mouth every 8 (eight) hours as needed for spasms. Patient not taking: Reported on 11/21/2023 06/24/23   [name], MD  diphenoxylate-atropine (LOMOTIL) 2.5-0.025 MG tablet Take 1 tablet by mouth 4 (four) times daily. Patient not taking: Reported on 11/21/2023 03/25/23   Carnella Guadalajara, PA-C  Insulin Pen Needle (DROPLET PEN NEEDLES) 31G X 5 MM MISC USE AS INSTRUCTED TO  INJECT INSULIN FOUR TIMES DAILY 05/26/23   Dani Gobble, NP  Misc. Devices (ROLLATOR ULTRA-LIGHT) MISC Rollator walker 04/27/23   [provider]  TRUE METRIX BLOOD GLUCOSE TEST test strip TEST BLOOD SUGAR TWICE DAILY 11/10/22   Roma Kayser, MD      Allergies    Contrast media [iodinated contrast media], Sulfa antibiotics, Doxepin, and Tape    Review of Systems   Review of Systems  Constitutional:  Negative for chills and fever.  Eyes: Negative.   Respiratory:  Negative for chest tightness and shortness of breath.   Cardiovascular:  Negative for chest pain.  Gastrointestinal:  Negative for abdominal pain, nausea and vomiting.  Genitourinary: Negative.  Negative for dysuria.  Musculoskeletal:  Negative for arthralgias, joint swelling and neck pain.  Skin: Negative.  Negative for rash and wound.  Neurological:  Positive for weakness. Negative for dizziness, light-headedness, numbness and headaches.  Psychiatric/Behavioral: Negative.      Physical Exam Updated Vital Signs BP 130/64 (BP Location: Right Arm)   Pulse 100   Temp 98.3 F (36.8 C) (Oral)   Resp 19   Ht 5\' 8"  (1.727 m)   Wt 113.4 kg   SpO2 94%   BMI 38.01 kg/m  Physical Exam Vitals and nursing note reviewed.  Constitutional:      Appearance: She is well-developed.  HENT:     Head: Normocephalic and atraumatic.  Eyes:     Conjunctiva/sclera: Conjunctivae normal.  Cardiovascular:     Rate and Rhythm: Normal rate and regular rhythm.     Heart sounds: Normal heart sounds.  Pulmonary:     Effort: Pulmonary effort is normal.     Breath sounds: Normal breath sounds. No wheezing.  Abdominal:     General: Bowel sounds are normal.     Palpations: Abdomen is soft.     Tenderness: There is no abdominal tenderness.  Genitourinary:    Comments: Clear yellow urine in the Foley bag. Musculoskeletal:        General: Normal range of motion.     Cervical back: Normal range of motion.  Skin:     General: Skin is warm and dry.  Neurological:     Mental Status: She is alert and oriented to person, place, and time.     ED Results / Procedures / Treatments   Labs (all labs ordered are listed, but only abnormal results are displayed) Labs Reviewed  CBC WITH DIFFERENTIAL/PLATELET - Abnormal; Notable for the following components:      Result Value   RDW 15.7 (*)    Platelets 101 (*)    All other components within normal limits  BASIC METABOLIC PANEL - Abnormal; Notable for the following components:   Glucose, Bld 230 (*)    BUN 25 (*)    Calcium 8.8 (*)    All other components within normal limits  URINALYSIS, ROUTINE W REFLEX MICROSCOPIC - Abnormal; Notable for the following components:  Hgb urine dipstick SMALL (*)    Leukocytes,Ua MODERATE (*)    All other components within normal limits  CULTURE, BLOOD (ROUTINE X 2)  CULTURE, BLOOD (ROUTINE X 2)  LACTIC ACID, PLASMA  LACTIC ACID, PLASMA    EKG None  Radiology DG Chest Port 1 View Result Date: 11/20/2023 CLINICAL DATA:  Hypotension and sepsis. EXAM: PORTABLE CHEST 1 VIEW COMPARISON:  Chest radiograph dated 06/01/2023 FINDINGS: Patient is rotated to the left. Right chest wall port projects over the medial left chest and tip projects over the SVC. Low lung volumes. No focal consolidations. No pleural effusion or pneumothorax. The heart size and mediastinal contours are within normal limits. No acute osseous abnormality. IMPRESSION: Low lung volumes. No focal consolidations. Electronically Signed   By: Agustin Cree M.D.   On: 11/20/2023 14:46    Procedures Procedures    Medications Ordered in ED Medications - No data to display  ED Course/ Medical Decision Making/ A&P                                 Medical Decision Making Patient presenting with a preliminary positive blood culture but actually feels some improved since her ED visit here yesterday after receiving IV fluids and has started on antibiotics for UTI.   Labs were repeated today including lactic acid, new set of blood cultures have been drawn, be met and CBC, urinalysis per below.  All is stable with a normal WBC count.  Given she is somewhat improved and her labs are better today, particularly her urinalysis, she will be sent home but advised to continue her antibiotics that she was prescribed yesterday.  Strict return precautions were outlined including development of fever, increased weakness or any other new complaints.  Of course we are waiting this new set of blood cultures to determine whether yesterday's were contaminated specimens.  Patient was discussed with Dr. Hyacinth Meeker regarding discharge planning.  Amount and/or Complexity of Data Reviewed Labs: ordered.    Details: Labs today are reassuring including lactic acid x 2, her be met most significant for glucose of 230, of note she is diabetic, CBC stable with a WBC count of 4.0 her urinalysis shows a small amount of hemoglobin, moderate leukocytes and no bacteria.           Final Clinical Impression(s) / ED Diagnoses Final diagnoses:  Positive blood culture    Rx / DC Orders ED Discharge Orders     None         Burgess Amor, Cordelia Poche 11/21/23 1847

## 2023-11-21 NOTE — Discharge Instructions (Addendum)
Since your symptoms are improved today and your labwork so far is stable with no sign of on going infection,  we will not admit you to the hospital today.  However,  if the cultures we collected today are also positive,  we may need to consider admission,  especially if you develop fever or any new or worsening symptoms.  In the interim,  continue taking the antibiotic you were prescribed for your urinary infection yesterday.  Your urine test today looks better, but make sure you finish this medicine.

## 2023-11-22 LAB — BLOOD CULTURE ID PANEL (REFLEXED) - BCID2

## 2023-11-22 LAB — CBG MONITORING, ED: Glucose-Capillary: 137 mg/dL — ABNORMAL HIGH (ref 70–99)

## 2023-11-22 NOTE — ED Notes (Signed)
Checked blood sugar per pt request; stable. Called AC for sandwich. Pt stating she has not ate since yesterday morning. Continues to await transport.

## 2023-11-22 NOTE — ED Notes (Signed)
Urine output 1250 from foley catheter.

## 2023-11-23 LAB — URINE CULTURE: Culture: 80000 — AB

## 2023-11-24 ENCOUNTER — Telehealth (HOSPITAL_BASED_OUTPATIENT_CLINIC_OR_DEPARTMENT_OTHER): Payer: Self-pay | Admitting: *Deleted

## 2023-11-24 LAB — CULTURE, BLOOD (ROUTINE X 2): Special Requests: ADEQUATE

## 2023-11-25 ENCOUNTER — Other Ambulatory Visit: Payer: Self-pay | Admitting: *Deleted

## 2023-11-25 ENCOUNTER — Telehealth (HOSPITAL_BASED_OUTPATIENT_CLINIC_OR_DEPARTMENT_OTHER): Payer: Self-pay | Admitting: *Deleted

## 2023-11-25 LAB — CULTURE, BLOOD (ROUTINE X 2): Special Requests: ADEQUATE

## 2023-11-25 MED ORDER — HYDROXYZINE PAMOATE 25 MG PO CAPS: 25.0000 mg | ORAL_CAPSULE | Freq: Two times a day (BID) | ORAL | 0 refills | Status: DC | PRN

## 2023-11-25 NOTE — Telephone Encounter (Signed)
 Post ED Visit - Positive Culture Follow-up: Unsuccessful Patient Follow-up  Culture assessed and recommendations reviewed by:  [x]  Koren Or Pharm.D. []  Venetia Gully, Pharm.D., BCPS AQ-ID []  Garrel Crews, Pharm.D., BCPS []  Almarie Lunger, Pharm.D., BCPS []  Charlo, [address].D., BCPS, AAHIVP []  Rosaline Bihari, Pharm.D., BCPS, AAHIVP []  Massie Rigg, PharmD []  Jodie Rower, PharmD, BCPS  Positive urine culture  []  Patient discharged without antimicrobial prescription and treatment is now indicated []  Organism is resistant to prescribed ED discharge antimicrobial []  Patient with positive blood cultures  Call to do a symptom check. If patient no improving will need to come back to the hospital for IV antibiotics   Unable to contact patient after 3 attempts, letter will be sent to address on file  Jama Wyman Kipper 11/25/2023, 10:28 AM

## 2023-11-26 LAB — CULTURE, BLOOD (ROUTINE X 2)
Culture: NO GROWTH
Culture: NO GROWTH

## 2023-11-30 ENCOUNTER — Telehealth: Payer: Self-pay | Admitting: *Deleted

## 2023-11-30 ENCOUNTER — Other Ambulatory Visit: Payer: Self-pay | Admitting: *Deleted

## 2023-11-30 NOTE — Telephone Encounter (Signed)
 Patient called to cancel all future appointments.  States that she is at a point where she wants nature to take it's course due to her quality of life.  PCP has made a referral to Palliative care.  She did agree to referral for home PT for strength training.  Will make Dr. Rogers aware.

## 2023-12-01 ENCOUNTER — Encounter: Payer: Self-pay | Admitting: Hematology

## 2023-12-02 ENCOUNTER — Other Ambulatory Visit: Payer: Self-pay | Admitting: Hematology

## 2023-12-09 ENCOUNTER — Other Ambulatory Visit: Payer: Self-pay | Admitting: *Deleted

## 2023-12-09 MED ORDER — OXYCODONE HCL 5 MG PO TABS: 5.0000 mg | ORAL_TABLET | Freq: Two times a day (BID) | ORAL | 0 refills | Status: DC | PRN

## 2023-12-10 ENCOUNTER — Other Ambulatory Visit: Payer: Self-pay

## 2023-12-10 MED ORDER — HYDROXYZINE PAMOATE 25 MG PO CAPS: 25.0000 mg | ORAL_CAPSULE | Freq: Two times a day (BID) | ORAL | 0 refills | Status: DC | PRN

## 2023-12-10 MED ORDER — QUETIAPINE FUMARATE 100 MG PO TABS: 100.0000 mg | ORAL_TABLET | Freq: Every day | ORAL | 0 refills | Status: DC

## 2023-12-14 ENCOUNTER — Other Ambulatory Visit: Payer: Self-pay | Admitting: *Deleted

## 2023-12-14 MED ORDER — QUETIAPINE FUMARATE 100 MG PO TABS: 100.0000 mg | ORAL_TABLET | Freq: Every day | ORAL | 0 refills | Status: DC

## 2023-12-14 MED ORDER — DICYCLOMINE HCL 10 MG PO CAPS: 10.0000 mg | ORAL_CAPSULE | Freq: Three times a day (TID) | ORAL | 1 refills | Status: DC | PRN

## 2023-12-14 MED ORDER — HYDROXYZINE PAMOATE 25 MG PO CAPS: 25.0000 mg | ORAL_CAPSULE | Freq: Two times a day (BID) | ORAL | 0 refills | Status: DC | PRN

## 2023-12-16 ENCOUNTER — Other Ambulatory Visit: Payer: Self-pay | Admitting: Nurse Practitioner

## 2023-12-16 DIAGNOSIS — E890 Postprocedural hypothyroidism: Secondary | ICD-10-CM

## 2023-12-17 ENCOUNTER — Telehealth: Payer: Self-pay

## 2023-12-17 NOTE — Telephone Encounter (Signed)
Notified Patient of prior authorization approval for Dicyclomine 10 mg Capsules. Medication is approved through 11/22/2024. Patient will notify Pharmacy because she has to provide a shipping date. No other needs or concerns noted at this time.

## 2023-12-20 ENCOUNTER — Other Ambulatory Visit: Payer: Self-pay | Admitting: *Deleted

## 2023-12-21 ENCOUNTER — Encounter: Payer: Self-pay | Admitting: Hematology

## 2023-12-21 MED ORDER — QUETIAPINE FUMARATE 100 MG PO TABS: 100.0000 mg | ORAL_TABLET | Freq: Every day | ORAL | 0 refills | Status: AC

## 2023-12-27 ENCOUNTER — Inpatient Hospital Stay: Payer: 59

## 2023-12-29 ENCOUNTER — Other Ambulatory Visit: Payer: 59

## 2023-12-29 ENCOUNTER — Ambulatory Visit (HOSPITAL_COMMUNITY): Payer: 59

## 2024-01-05 ENCOUNTER — Inpatient Hospital Stay: Payer: 59 | Admitting: Hematology

## 2024-01-31 ENCOUNTER — Other Ambulatory Visit: Payer: Self-pay | Admitting: Hematology

## 2024-02-03 ENCOUNTER — Encounter: Payer: Self-pay | Admitting: Hematology

## 2024-02-14 ENCOUNTER — Other Ambulatory Visit: Payer: Self-pay | Admitting: Hematology

## 2024-04-01 ENCOUNTER — Inpatient Hospital Stay (HOSPITAL_COMMUNITY)
Admission: EM | Admit: 2024-04-01 | Discharge: 2024-04-04 | DRG: 698 | Disposition: A | Discharge status: Home Health | Attending: Family Medicine | Admitting: Family Medicine

## 2024-04-01 ENCOUNTER — Other Ambulatory Visit: Payer: Self-pay

## 2024-04-01 ENCOUNTER — Encounter (HOSPITAL_COMMUNITY): Payer: Self-pay | Admitting: Emergency Medicine

## 2024-04-01 ENCOUNTER — Emergency Department (HOSPITAL_COMMUNITY)

## 2024-04-01 ENCOUNTER — Encounter: Payer: Self-pay | Admitting: Hematology

## 2024-04-01 DIAGNOSIS — Y738 Miscellaneous gastroenterology and urology devices associated with adverse incidents, not elsewhere classified: Secondary | ICD-10-CM | POA: Diagnosis present

## 2024-04-01 DIAGNOSIS — Z7401 Bed confinement status: Secondary | ICD-10-CM

## 2024-04-01 DIAGNOSIS — Z794 Long term (current) use of insulin: Secondary | ICD-10-CM

## 2024-04-01 DIAGNOSIS — K219 Gastro-esophageal reflux disease without esophagitis: Secondary | ICD-10-CM | POA: Diagnosis present

## 2024-04-01 DIAGNOSIS — E1142 Type 2 diabetes mellitus with diabetic polyneuropathy: Secondary | ICD-10-CM | POA: Diagnosis present

## 2024-04-01 DIAGNOSIS — Z1612 Extended spectrum beta lactamase (ESBL) resistance: Secondary | ICD-10-CM | POA: Diagnosis present

## 2024-04-01 DIAGNOSIS — E118 Type 2 diabetes mellitus with unspecified complications: Secondary | ICD-10-CM

## 2024-04-01 DIAGNOSIS — E890 Postprocedural hypothyroidism: Secondary | ICD-10-CM | POA: Diagnosis present

## 2024-04-01 DIAGNOSIS — E66812 Obesity, class 2: Secondary | ICD-10-CM | POA: Diagnosis present

## 2024-04-01 DIAGNOSIS — Z800 Family history of malignant neoplasm of digestive organs: Secondary | ICD-10-CM

## 2024-04-01 DIAGNOSIS — Z635 Disruption of family by separation and divorce: Secondary | ICD-10-CM

## 2024-04-01 DIAGNOSIS — Z9089 Acquired absence of other organs: Secondary | ICD-10-CM

## 2024-04-01 DIAGNOSIS — A419 Sepsis, unspecified organism: Secondary | ICD-10-CM | POA: Diagnosis present

## 2024-04-01 DIAGNOSIS — E861 Hypovolemia: Secondary | ICD-10-CM | POA: Diagnosis present

## 2024-04-01 DIAGNOSIS — Z90710 Acquired absence of both cervix and uterus: Secondary | ICD-10-CM

## 2024-04-01 DIAGNOSIS — Z66 Do not resuscitate: Secondary | ICD-10-CM | POA: Diagnosis present

## 2024-04-01 DIAGNOSIS — Y842 Radiological procedure and radiotherapy as the cause of abnormal reaction of the patient, or of later complication, without mention of misadventure at the time of the procedure: Secondary | ICD-10-CM | POA: Diagnosis present

## 2024-04-01 DIAGNOSIS — Z862 Personal history of diseases of the blood and blood-forming organs and certain disorders involving the immune mechanism: Secondary | ICD-10-CM

## 2024-04-01 DIAGNOSIS — N390 Urinary tract infection, site not specified: Secondary | ICD-10-CM | POA: Diagnosis present

## 2024-04-01 DIAGNOSIS — Z888 Allergy status to other drugs, medicaments and biological substances status: Secondary | ICD-10-CM

## 2024-04-01 DIAGNOSIS — Z9889 Other specified postprocedural states: Secondary | ICD-10-CM

## 2024-04-01 DIAGNOSIS — G928 Other toxic encephalopathy: Secondary | ICD-10-CM | POA: Diagnosis present

## 2024-04-01 DIAGNOSIS — Z91048 Other nonmedicinal substance allergy status: Secondary | ICD-10-CM

## 2024-04-01 DIAGNOSIS — C20 Malignant neoplasm of rectum: Secondary | ICD-10-CM | POA: Diagnosis present

## 2024-04-01 DIAGNOSIS — E559 Vitamin D deficiency, unspecified: Secondary | ICD-10-CM | POA: Diagnosis present

## 2024-04-01 DIAGNOSIS — I959 Hypotension, unspecified: Secondary | ICD-10-CM | POA: Diagnosis not present

## 2024-04-01 DIAGNOSIS — M1990 Unspecified osteoarthritis, unspecified site: Secondary | ICD-10-CM | POA: Diagnosis present

## 2024-04-01 DIAGNOSIS — G894 Chronic pain syndrome: Secondary | ICD-10-CM | POA: Diagnosis present

## 2024-04-01 DIAGNOSIS — A4159 Other Gram-negative sepsis: Secondary | ICD-10-CM | POA: Diagnosis present

## 2024-04-01 DIAGNOSIS — E1165 Type 2 diabetes mellitus with hyperglycemia: Secondary | ICD-10-CM | POA: Diagnosis present

## 2024-04-01 DIAGNOSIS — L89152 Pressure ulcer of sacral region, stage 2: Secondary | ICD-10-CM | POA: Diagnosis present

## 2024-04-01 DIAGNOSIS — Z6838 Body mass index (BMI) 38.0-38.9, adult: Secondary | ICD-10-CM

## 2024-04-01 DIAGNOSIS — Z91041 Radiographic dye allergy status: Secondary | ICD-10-CM

## 2024-04-01 DIAGNOSIS — I10 Essential (primary) hypertension: Secondary | ICD-10-CM | POA: Diagnosis present

## 2024-04-01 DIAGNOSIS — Z823 Family history of stroke: Secondary | ICD-10-CM

## 2024-04-01 DIAGNOSIS — Z8719 Personal history of other diseases of the digestive system: Secondary | ICD-10-CM

## 2024-04-01 DIAGNOSIS — Z882 Allergy status to sulfonamides status: Secondary | ICD-10-CM

## 2024-04-01 DIAGNOSIS — Z8249 Family history of ischemic heart disease and other diseases of the circulatory system: Secondary | ICD-10-CM

## 2024-04-01 DIAGNOSIS — D509 Iron deficiency anemia, unspecified: Secondary | ICD-10-CM | POA: Diagnosis present

## 2024-04-01 DIAGNOSIS — Z79890 Hormone replacement therapy (postmenopausal): Secondary | ICD-10-CM

## 2024-04-01 DIAGNOSIS — Z87440 Personal history of urinary (tract) infections: Secondary | ICD-10-CM

## 2024-04-01 DIAGNOSIS — Y846 Urinary catheterization as the cause of abnormal reaction of the patient, or of later complication, without mention of misadventure at the time of the procedure: Secondary | ICD-10-CM | POA: Diagnosis present

## 2024-04-01 DIAGNOSIS — T83511A Infection and inflammatory reaction due to indwelling urethral catheter, initial encounter: Principal | ICD-10-CM | POA: Diagnosis present

## 2024-04-01 DIAGNOSIS — R680 Hypothermia, not associated with low environmental temperature: Secondary | ICD-10-CM | POA: Diagnosis present

## 2024-04-01 DIAGNOSIS — I2510 Atherosclerotic heart disease of native coronary artery without angina pectoris: Secondary | ICD-10-CM | POA: Diagnosis present

## 2024-04-01 DIAGNOSIS — Z85048 Personal history of other malignant neoplasm of rectum, rectosigmoid junction, and anus: Secondary | ICD-10-CM

## 2024-04-01 DIAGNOSIS — Z515 Encounter for palliative care: Secondary | ICD-10-CM

## 2024-04-01 DIAGNOSIS — Z8619 Personal history of other infectious and parasitic diseases: Secondary | ICD-10-CM

## 2024-04-01 DIAGNOSIS — F419 Anxiety disorder, unspecified: Secondary | ICD-10-CM | POA: Diagnosis present

## 2024-04-01 DIAGNOSIS — E872 Acidosis, unspecified: Secondary | ICD-10-CM | POA: Diagnosis present

## 2024-04-01 DIAGNOSIS — E876 Hypokalemia: Secondary | ICD-10-CM | POA: Diagnosis not present

## 2024-04-01 DIAGNOSIS — Z8541 Personal history of malignant neoplasm of cervix uteri: Secondary | ICD-10-CM

## 2024-04-01 DIAGNOSIS — Z79899 Other long term (current) drug therapy: Secondary | ICD-10-CM

## 2024-04-01 DIAGNOSIS — R54 Age-related physical debility: Secondary | ICD-10-CM | POA: Diagnosis present

## 2024-04-01 DIAGNOSIS — R4182 Altered mental status, unspecified: Principal | ICD-10-CM

## 2024-04-01 DIAGNOSIS — F32A Depression, unspecified: Secondary | ICD-10-CM | POA: Diagnosis present

## 2024-04-01 DIAGNOSIS — Z833 Family history of diabetes mellitus: Secondary | ICD-10-CM

## 2024-04-01 DIAGNOSIS — E538 Deficiency of other specified B group vitamins: Secondary | ICD-10-CM | POA: Diagnosis present

## 2024-04-01 DIAGNOSIS — Z9221 Personal history of antineoplastic chemotherapy: Secondary | ICD-10-CM

## 2024-04-01 LAB — COMPREHENSIVE METABOLIC PANEL WITH GFR
ALT: 7 U/L (ref 0–44)
AST: 13 U/L — ABNORMAL LOW (ref 15–41)
Albumin: 2.4 g/dL — ABNORMAL LOW (ref 3.5–5.0)
Alkaline Phosphatase: 109 U/L (ref 38–126)
Anion gap: 11 (ref 5–15)
BUN: 12 mg/dL (ref 8–23)
CO2: 28 mmol/L (ref 22–32)
Calcium: 8.5 mg/dL — ABNORMAL LOW (ref 8.9–10.3)
Chloride: 94 mmol/L — ABNORMAL LOW (ref 98–111)
Creatinine, Ser: 0.54 mg/dL (ref 0.44–1.00)
GFR, Estimated: >60 mL/min (ref >60)
Glucose, Bld: 237 mg/dL — ABNORMAL HIGH (ref 70–99)
Potassium: 3.8 mmol/L (ref 3.5–5.1)
Sodium: 133 mmol/L — ABNORMAL LOW (ref 135–145)
Total Bilirubin: 0.5 mg/dL (ref 0.0–1.2)
Total Protein: 6.5 g/dL (ref 6.5–8.1)

## 2024-04-01 LAB — CBC WITH DIFFERENTIAL/PLATELET
Abs Immature Granulocytes: 0.04 10*3/uL (ref 0.00–0.07)
Basophils Absolute: 0.1 10*3/uL (ref 0.0–0.1)
Basophils Relative: 1 %
Eosinophils Absolute: 0.3 10*3/uL (ref 0.0–0.5)
Eosinophils Relative: 5 %
HCT: 44.4 % (ref 36.0–46.0)
Hemoglobin: 13.6 g/dL (ref 12.0–15.0)
Immature Granulocytes: 1 %
Lymphocytes Relative: 22 %
Lymphs Abs: 1.4 10*3/uL (ref 0.7–4.0)
MCH: 24.3 pg — ABNORMAL LOW (ref 26.0–34.0)
MCHC: 30.6 g/dL (ref 30.0–36.0)
MCV: 79.4 fL — ABNORMAL LOW (ref 80.0–100.0)
Monocytes Absolute: 0.3 10*3/uL (ref 0.1–1.0)
Monocytes Relative: 4 %
Neutro Abs: 4.2 10*3/uL (ref 1.7–7.7)
Neutrophils Relative %: 67 %
Platelets: 106 10*3/uL — ABNORMAL LOW (ref 150–400)
RBC: 5.59 MIL/uL — ABNORMAL HIGH (ref 3.87–5.11)
RDW: 15.9 % — ABNORMAL HIGH (ref 11.5–15.5)
WBC: 6.2 10*3/uL (ref 4.0–10.5)
nRBC: 0 % (ref 0.0–0.2)

## 2024-04-01 LAB — LACTIC ACID, PLASMA: Lactic Acid, Venous: 1.6 mmol/L (ref 0.5–1.9)

## 2024-04-01 LAB — PROTIME-INR
INR: 1.1 (ref 0.8–1.2)
Prothrombin Time: 14 s (ref 11.4–15.2)

## 2024-04-01 MED ORDER — VANCOMYCIN HCL 2000 MG/400ML IV SOLN
2000.0000 mg | Freq: Once | INTRAVENOUS | Status: AC
  Administered 2024-04-01: 2000 mg via INTRAVENOUS
  Filled 2024-04-01: qty 400

## 2024-04-01 MED ORDER — LACTATED RINGERS IV BOLUS
1000.0000 mL | Freq: Once | INTRAVENOUS | Status: AC
  Administered 2024-04-01: 1000 mL via INTRAVENOUS

## 2024-04-01 MED ORDER — FENTANYL CITRATE PF 50 MCG/ML IJ SOSY
50.0000 ug | PREFILLED_SYRINGE | Freq: Once | INTRAMUSCULAR | Status: AC
  Administered 2024-04-01: 50 ug via INTRAVENOUS
  Filled 2024-04-01: qty 1

## 2024-04-01 MED ORDER — CEFEPIME HCL 2 G IV SOLR
2.0000 g | Freq: Once | INTRAVENOUS | Status: AC
  Administered 2024-04-02: 2 g via INTRAVENOUS
  Filled 2024-04-01: qty 12.5

## 2024-04-01 NOTE — ED Provider Notes (Signed)
  EMERGENCY DEPARTMENT AT [facility] Provider Note  CSN: [id number] Arrival date & time: 04/01/24 2134  Chief Complaint(s) Altered Mental Status  HPI [name] is a 68 y.o. female with PMH rectal cancer no longer on chemotherapy and in hospice care, T2DM, frequent UTIs with indwelling Foley catheter who presents emergency room for evaluation of altered mental status.  Patient found to be progressively more altered by family and ambulance called.  Here in the emergency room, patient is encephalopathic and ill-appearing.  She arrives tachycardic and hypotensive, hypothermic.  Additional history on review obtained as patient is currently altered.  Daughter is quite upset and knows that she is a DNR/DNI but is unsure of her MOST form recommendations.  Past Medical History Past Medical History:  Diagnosis Date   Anxiety    Cancer (HCC)    rectal   Coronary atherosclerosis    Cardiac CT 09/2018 with calcium  score 8.7 and mild proximal LAD disease   Essential hypertension 2009   GERD (gastroesophageal reflux disease)    Hypothyroidism    Iron  deficiency anemia    Osteoarthritis    Palpitations    Tachypalpitations on beta blockers since 2010   Type 2 diabetes mellitus (HCC) 2003   Vitamin B 12 deficiency    Vitamin D  deficiency    Patient Active Problem List   Diagnosis Date Noted   Sepsis secondary to UTI (HCC) 06/02/2023   Pressure injury of skin 06/02/2023   Weakness 06/01/2023   Emesis, persistent 04/13/2023   Rectal bleeding 01/27/2023   Acute blood loss anemia 01/27/2023   Pancytopenia (HCC) 01/27/2023   Rectal cancer (HCC) 01/03/2023   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   Vomiting and diarrhea 09/11/2018   Hypokalemia 09/11/2018   Hyponatremia 09/11/2018   Gastroenteritis 09/11/2018   Chest pain 08/17/2018   Mild aortic stenosis 08/17/2018   Iron  deficiency anemia 10/05/2017   Recurrent  major depressive disorder, in partial remission (HCC) 10/06/2016   Mixed hyperlipidemia 02/01/2016   Hyperthyroidism 02/01/2016   Arthritis 02/01/2016   GERD (gastroesophageal reflux disease) 02/01/2016   Hypertension 02/01/2016   Uncontrolled type 2 diabetes mellitus with peripheral neuropathy 02/01/2016   Vitamin D  deficiency 02/01/2016   Palpitations 04/03/2011   Morbid obesity (HCC) 04/03/2011   Essential hypertension, benign 04/03/2011   Type 2 diabetes mellitus with complication, with long-term current use of insulin  (HCC) 04/03/2011   Home Medication(s) Prior to Admission medications   Medication Sig Start Date End Date Taking? Authorizing Provider  blood glucose meter kit and supplies KIT Dispense based on patient and insurance preference. Use up to four times daily as directed. (FOR ICD-10 E11.65) Number of Strips 400 Number of Lancets 400 02/16/23   Nida, Gebreselassie W, MD  Blood Glucose Monitoring Suppl (TRUE METRIX METER) w/Device KIT 1 each by Does not apply route 2 (two) times daily. Use to test BG bid E11.65 03/19/23   Wendel Hals, NP  ciprofloxacin  (CIPRO ) 500 MG tablet Take 1 tablet (500 mg total) by mouth 2 (two) times daily. Patient not taking: Reported on 11/21/2023 10/11/23   Trish Furl, MD  cyclobenzaprine (FLEXERIL) 10 MG tablet Take 10 mg by mouth 2 (two) times daily as needed for muscle spasms.    [provider]  dicyclomine  (BENTYL ) 10 MG capsule TAKE 1 CAPSULE EVERY 8 HOURS AS NEEDED FOR SPASMS. 02/14/24   Paulett Boros, MD  diphenoxylate -atropine  (LOMOTIL ) 2.5-0.025 MG tablet Take 1 tablet by mouth 4 (  four) times daily. Patient not taking: Reported on 11/21/2023 03/25/23   Sonnie Dusky, PA-C  FLUoxetine  (PROZAC ) 20 MG capsule Take 20 mg by mouth daily.    [provider]  hydrOXYzine  (VISTARIL ) 25 MG capsule Take 1 capsule (25 mg total) by mouth 2 (two) times daily as needed for anxiety. 12/14/23   Paulett Boros, MD   Insulin  Pen Needle (DROPLET PEN NEEDLES) 31G X 5 MM MISC USE AS INSTRUCTED TO INJECT INSULIN  FOUR TIMES DAILY 05/26/23   Wendel Hals, NP  levothyroxine  (SYNTHROID ) 88 MCG tablet TAKE ONE TABLET BY MOUTH DAILY BEFORE BREAKFAST 12/16/23   Wendel Hals, NP  magnesium  oxide (MAG-OX) 400 (240 Mg) MG tablet Take 2 tablets (800 mg total) by mouth daily. 05/19/23   Paulett Boros, MD  Misc. Devices (ROLLATOR ULTRA-LIGHT) MISC Rollator walker 04/27/23   [provider]  oxyCODONE  (OXY IR/ROXICODONE ) 5 MG immediate release tablet Take 1 tablet (5 mg total) by mouth every 12 (twelve) hours as needed for severe pain (pain score 7-10). 12/09/23   Paulett Boros, MD  pantoprazole  (PROTONIX ) 40 MG tablet Take 1 tablet (40 mg total) by mouth 2 (two) times daily before a meal. 04/13/23   Emokpae, Courage, MD  pregabalin  (LYRICA ) 100 MG capsule Take 100 mg by mouth 2 (two) times daily.    [provider]  QUEtiapine  (SEROQUEL ) 100 MG tablet Take 1 tablet (100 mg total) by mouth at bedtime. 12/21/23   Paulett Boros, MD  TRUE METRIX BLOOD GLUCOSE TEST test strip TEST BLOOD SUGAR TWICE DAILY 11/10/22   Baby Bolt, MD                                                                                                                                    Past Surgical History Past Surgical History:  Procedure Laterality Date   ABDOMINAL HYSTERECTOMY     History of cervical cancer   BIOPSY  12/16/2022   Procedure: BIOPSY;  Surgeon: Suzette Espy, MD;  Location: [facility];  Service: Endoscopy;;  gastric, rectal   BREAST BIOPSY Right    COLONOSCOPY WITH PROPOFOL  N/A 12/16/2022   Procedure: COLONOSCOPY WITH PROPOFOL ;  Surgeon: Suzette Espy, MD;  Location: [facility];  Service: Endoscopy;  Laterality: N/A;  10:45 am   ESOPHAGOGASTRODUODENOSCOPY (EGD) WITH PROPOFOL  N/A 12/16/2022   Procedure: ESOPHAGOGASTRODUODENOSCOPY (EGD) WITH PROPOFOL ;  Surgeon: Suzette Espy,  MD;  Location: [facility];  Service: Endoscopy;  Laterality: N/A;   IR IMAGING GUIDED PORT INSERTION  01/15/2023   MALONEY DILATION N/A 12/16/2022   Procedure: Londa Rival DILATION;  Surgeon: Suzette Espy, MD;  Location: [facility];  Service: Endoscopy;  Laterality: N/A;   POLYPECTOMY  12/16/2022   Procedure: POLYPECTOMY;  Surgeon: Suzette Espy, MD;  Location: [facility];  Service: Endoscopy;;   TONSILLECTOMY     Family History Family History  Problem Relation  Age of Onset   Heart failure Mother    Diabetes Father    Hypertension Father    Stroke Sister 31   Diabetes Brother    Cancer - Colon Other        age 22   Colon polyps Neg Hx     Social History Social History   Tobacco Use   Smoking status: Never   Smokeless tobacco: Never  Vaping Use   Vaping status: Never Used  Substance Use Topics   Alcohol use: No   Drug use: No   Allergies Contrast media [iodinated contrast media], Sulfa antibiotics, Doxepin, and Tape  Review of Systems Review of Systems  Unable to perform ROS: Mental status change    Physical Exam Vital Signs  I have reviewed the triage vital signs BP (!) 82/56   Pulse (!) 106   Temp (!) 97.2 F (36.2 C) (Axillary)   Resp (!) 8   Ht 5\' 8"  (1.727 m)   Wt 113.4 kg   SpO2 97%   BMI 38.01 kg/m   Physical Exam Vitals and nursing note reviewed.  Constitutional:      General: She is in acute distress.     Appearance: She is well-developed. She is ill-appearing.  HENT:     Head: Normocephalic and atraumatic.  Eyes:     Conjunctiva/sclera: Conjunctivae normal.  Cardiovascular:     Rate and Rhythm: Regular rhythm. Tachycardia present.     Heart sounds: No murmur heard. Pulmonary:     Effort: Pulmonary effort is normal. No respiratory distress.     Breath sounds: Normal breath sounds.  Abdominal:     Palpations: Abdomen is soft.     Tenderness: There is no abdominal tenderness.  Musculoskeletal:        General: No swelling.      Cervical back: Neck supple.  Skin:    General: Skin is warm and dry.     Capillary Refill: Capillary refill takes less than 2 seconds.     Coloration: Skin is pale.  Neurological:     Mental Status: She is alert.  Psychiatric:        Mood and Affect: Mood normal.     ED Results and Treatments Labs (all labs ordered are listed, but only abnormal results are displayed) Labs Reviewed  CULTURE, BLOOD (ROUTINE X 2)  CULTURE, BLOOD (ROUTINE X 2)  LACTIC ACID, PLASMA  LACTIC ACID, PLASMA  COMPREHENSIVE METABOLIC PANEL WITH GFR  CBC WITH DIFFERENTIAL/PLATELET  PROTIME-INR  URINALYSIS, W/ REFLEX TO CULTURE (INFECTION SUSPECTED)                                                                                                                          Radiology DG Chest Port 1 View Result Date: 04/01/2024 CLINICAL DATA:  Questionable sepsis - evaluate for abnormality EXAM: PORTABLE CHEST 1 VIEW COMPARISON:  Chest x-ray 11/20/2023, CT abdomen pelvis of 10/11/2023 FINDINGS: Patient is rotated. Similar-appearing right chest wall Port-A-Cath  with tip overlying the expected region of the superior vena cava. The heart and mediastinal contours are unchanged. Low lung volumes. Question developing retrocardiac airspace opacity. No pulmonary edema. No pleural effusion. No pneumothorax. No acute osseous abnormality. IMPRESSION: Low lung volumes with question retrocardiac airspace opacity. Finding could represent developing infection or atelectasis. Limited evaluation due to patient rotation and low lung volumes. Consider repeat PA and lateral view of the chest for further evaluation. Electronically Signed   By: Morgane  Naveau M.D.   On: 04/01/2024 22:33    Pertinent labs & imaging results that were available during my care of the patient were reviewed by me and considered in my medical decision making (see MDM for details).  Medications Ordered in ED Medications  lactated ringers  bolus 1,000 mL (has no  administration in time range)                                                                                                                                     Procedures .Critical Care  Performed by: Karlyn Overman, MD Authorized by: Karlyn Overman, MD   Critical care provider statement:    Critical care time (minutes):  30   Critical care was necessary to treat or prevent imminent or life-threatening deterioration of the following conditions:  Shock   Critical care was time spent personally by me on the following activities:  Development of treatment plan with patient or surrogate, discussions with consultants, evaluation of patient's response to treatment, examination of patient, ordering and review of laboratory studies, ordering and review of radiographic studies, ordering and performing treatments and interventions, pulse oximetry, re-evaluation of patient's condition and review of old charts   (including critical care time)  Medical Decision Making / ED Course   This patient presents to the ED for concern of altered mental status, this involves an extensive number of treatment options, and is a complaint that carries with it a high risk of complications and morbidity.  The differential diagnosis includes infection, metabolic/toxic encephalopathy, hypoglycemia, malperfusion, hypoxia, trauma or other intracranial process  MDM: Patient seen emerged part for evaluation of altered mental status.  Physical exam reveals an ill-appearing encephalopathic patient.  She arrives hypotensive and tachycardic and sepsis protocol initiated.  Fluid resuscitation begun as well as broad-spectrum antibiotics.  Chest x-ray with possible retrocardiac opacity.  Pending completion of laboratory evaluation and imaging studies at time of signout.  Please see provider signout note for continuation of workup.   Additional history obtained: -Additional history obtained from daughter -External records from  outside source obtained and reviewed including: Chart review including previous notes, labs, imaging, consultation notes   Lab Tests: -I ordered, reviewed, and interpreted labs.   The pertinent results include:   Labs Reviewed  CULTURE, BLOOD (ROUTINE X 2)  CULTURE, BLOOD (ROUTINE X 2)  LACTIC ACID, PLASMA  LACTIC ACID, PLASMA  COMPREHENSIVE METABOLIC PANEL WITH GFR  CBC WITH DIFFERENTIAL/PLATELET  PROTIME-INR  URINALYSIS, W/ REFLEX TO CULTURE (INFECTION SUSPECTED)      EKG   EKG Interpretation Date/Time:  Saturday Apr 01 2024 22:29:22 EDT Ventricular Rate:  109 PR Interval:  161 QRS Duration:  91 QT Interval:  360 QTC Calculation: 485 R Axis:   234  Text Interpretation: Sinus tachycardia Confirmed by Cammie Faulstich ([id number]) on 04/01/2024 10:45:23 PM         Imaging Studies ordered: I ordered imaging studies including chest x-ray I independently visualized and interpreted imaging. I agree with the radiologist interpretation  CT chest abdomen pelvis ordered and is pending   Medicines ordered and prescription drug management: Meds ordered this encounter  Medications   lactated ringers  bolus 1,000 mL    -I have reviewed the patients home medicines and have made adjustments as needed  Critical interventions Fluids, antibiotics    Cardiac Monitoring: The patient was maintained on a cardiac monitor.  I personally viewed and interpreted the cardiac monitored which showed an underlying rhythm of: Sinus tachycardia  Social Determinants of Health:  Factors impacting patients care include: Currently in hospice care   Reevaluation: After the interventions noted above, I reevaluated the patient and found that they have :improved  Co morbidities that complicate the patient evaluation  Past Medical History:  Diagnosis Date   Anxiety    Cancer (HCC)    rectal   Coronary atherosclerosis    Cardiac CT 09/2018 with calcium  score 8.7 and mild proximal LAD disease    Essential hypertension 2009   GERD (gastroesophageal reflux disease)    Hypothyroidism    Iron  deficiency anemia    Osteoarthritis    Palpitations    Tachypalpitations on beta blockers since 2010   Type 2 diabetes mellitus (HCC) 2003   Vitamin B 12 deficiency    Vitamin D  deficiency       Dispostion: I considered admission for this patient, and disposition pending completion of imaging and laboratory evaluation.  Anticipate admission.     Final Clinical Impression(s) / ED Diagnoses Final diagnoses:  None     @PCDICTATION @    Karlyn Overman, MD 04/01/24 2246

## 2024-04-01 NOTE — ED Triage Notes (Signed)
 Pt bib EMS for AMS after family noticed pt had a decreased LOC when they came back from running errands today. Pt currently on hospice care [facility]) and has hx of rectal cancer. Pt also has hx of UTI's and has an indwelling foley catheter. EMS reports BP of 90/51 and HR 110-120. EMS gave pt approximately 500cc NS en route.

## 2024-04-01 NOTE — Progress Notes (Signed)
 ED Pharmacy Antibiotic Sign Off An antibiotic consult was received from an ED provider for vancomycin  per pharmacy dosing for sepsis. A chart review was completed to assess appropriateness.   The following one time order(s) were placed:   -Vancomycin  2g IV x1  Further antibiotic and/or antibiotic pharmacy consults should be ordered by the admitting provider if indicated.   Thank you for allowing pharmacy to be a part of this patient's care.   Young Hensen, [facility]  Clinical Pharmacist 04/01/24 10:57 PM

## 2024-04-02 DIAGNOSIS — Y842 Radiological procedure and radiotherapy as the cause of abnormal reaction of the patient, or of later complication, without mention of misadventure at the time of the procedure: Secondary | ICD-10-CM | POA: Diagnosis present

## 2024-04-02 DIAGNOSIS — E118 Type 2 diabetes mellitus with unspecified complications: Secondary | ICD-10-CM | POA: Diagnosis not present

## 2024-04-02 DIAGNOSIS — T83511A Infection and inflammatory reaction due to indwelling urethral catheter, initial encounter: Secondary | ICD-10-CM | POA: Diagnosis present

## 2024-04-02 DIAGNOSIS — E890 Postprocedural hypothyroidism: Secondary | ICD-10-CM

## 2024-04-02 DIAGNOSIS — A419 Sepsis, unspecified organism: Secondary | ICD-10-CM | POA: Diagnosis present

## 2024-04-02 DIAGNOSIS — G894 Chronic pain syndrome: Secondary | ICD-10-CM | POA: Diagnosis present

## 2024-04-02 DIAGNOSIS — E876 Hypokalemia: Secondary | ICD-10-CM | POA: Diagnosis not present

## 2024-04-02 DIAGNOSIS — E1165 Type 2 diabetes mellitus with hyperglycemia: Secondary | ICD-10-CM | POA: Diagnosis present

## 2024-04-02 DIAGNOSIS — E538 Deficiency of other specified B group vitamins: Secondary | ICD-10-CM | POA: Diagnosis present

## 2024-04-02 DIAGNOSIS — A4159 Other Gram-negative sepsis: Secondary | ICD-10-CM | POA: Diagnosis present

## 2024-04-02 DIAGNOSIS — C20 Malignant neoplasm of rectum: Secondary | ICD-10-CM

## 2024-04-02 DIAGNOSIS — R680 Hypothermia, not associated with low environmental temperature: Secondary | ICD-10-CM | POA: Diagnosis present

## 2024-04-02 DIAGNOSIS — D509 Iron deficiency anemia, unspecified: Secondary | ICD-10-CM | POA: Diagnosis present

## 2024-04-02 DIAGNOSIS — E66812 Obesity, class 2: Secondary | ICD-10-CM

## 2024-04-02 DIAGNOSIS — I959 Hypotension, unspecified: Secondary | ICD-10-CM

## 2024-04-02 DIAGNOSIS — Z1612 Extended spectrum beta lactamase (ESBL) resistance: Secondary | ICD-10-CM | POA: Diagnosis present

## 2024-04-02 DIAGNOSIS — Z794 Long term (current) use of insulin: Secondary | ICD-10-CM | POA: Diagnosis not present

## 2024-04-02 DIAGNOSIS — Z515 Encounter for palliative care: Secondary | ICD-10-CM | POA: Diagnosis not present

## 2024-04-02 DIAGNOSIS — L89152 Pressure ulcer of sacral region, stage 2: Secondary | ICD-10-CM | POA: Diagnosis present

## 2024-04-02 DIAGNOSIS — N390 Urinary tract infection, site not specified: Secondary | ICD-10-CM | POA: Diagnosis not present

## 2024-04-02 DIAGNOSIS — K219 Gastro-esophageal reflux disease without esophagitis: Secondary | ICD-10-CM | POA: Diagnosis present

## 2024-04-02 DIAGNOSIS — E861 Hypovolemia: Secondary | ICD-10-CM | POA: Diagnosis present

## 2024-04-02 DIAGNOSIS — E1142 Type 2 diabetes mellitus with diabetic polyneuropathy: Secondary | ICD-10-CM | POA: Diagnosis present

## 2024-04-02 DIAGNOSIS — F32A Depression, unspecified: Secondary | ICD-10-CM

## 2024-04-02 DIAGNOSIS — Y846 Urinary catheterization as the cause of abnormal reaction of the patient, or of later complication, without mention of misadventure at the time of the procedure: Secondary | ICD-10-CM | POA: Diagnosis present

## 2024-04-02 DIAGNOSIS — Y738 Miscellaneous gastroenterology and urology devices associated with adverse incidents, not elsewhere classified: Secondary | ICD-10-CM | POA: Diagnosis present

## 2024-04-02 DIAGNOSIS — I10 Essential (primary) hypertension: Secondary | ICD-10-CM | POA: Diagnosis present

## 2024-04-02 DIAGNOSIS — E872 Acidosis, unspecified: Secondary | ICD-10-CM | POA: Diagnosis present

## 2024-04-02 DIAGNOSIS — I2510 Atherosclerotic heart disease of native coronary artery without angina pectoris: Secondary | ICD-10-CM | POA: Diagnosis present

## 2024-04-02 DIAGNOSIS — G928 Other toxic encephalopathy: Secondary | ICD-10-CM | POA: Diagnosis present

## 2024-04-02 DIAGNOSIS — Z66 Do not resuscitate: Secondary | ICD-10-CM | POA: Diagnosis present

## 2024-04-02 DIAGNOSIS — Z7189 Other specified counseling: Secondary | ICD-10-CM | POA: Diagnosis not present

## 2024-04-02 LAB — CBC
HCT: 36.8 % (ref 36.0–46.0)
Hemoglobin: 11.4 g/dL — ABNORMAL LOW (ref 12.0–15.0)
MCH: 24.9 pg — ABNORMAL LOW (ref 26.0–34.0)
MCHC: 31 g/dL (ref 30.0–36.0)
MCV: 80.3 fL (ref 80.0–100.0)
Platelets: 143 10*3/uL — ABNORMAL LOW (ref 150–400)
RBC: 4.58 MIL/uL (ref 3.87–5.11)
RDW: 16.2 % — ABNORMAL HIGH (ref 11.5–15.5)
WBC: 6.7 10*3/uL (ref 4.0–10.5)
nRBC: 0 % (ref 0.0–0.2)

## 2024-04-02 LAB — LACTIC ACID, PLASMA: Lactic Acid, Venous: 2.5 mmol/L (ref 0.5–1.9)

## 2024-04-02 LAB — URINALYSIS, W/ REFLEX TO CULTURE (INFECTION SUSPECTED)
Bilirubin Urine: NEGATIVE
Glucose, UA: 50 mg/dL — AB
Ketones, ur: NEGATIVE mg/dL
Nitrite: NEGATIVE
Protein, ur: >=300 mg/dL — AB
Specific Gravity, Urine: 1.022 (ref 1.005–1.030)
pH: 5 (ref 5.0–8.0)

## 2024-04-02 LAB — GLUCOSE, CAPILLARY
Glucose-Capillary: 222 mg/dL — ABNORMAL HIGH (ref 70–99)
Glucose-Capillary: 244 mg/dL — ABNORMAL HIGH (ref 70–99)
Glucose-Capillary: 312 mg/dL — ABNORMAL HIGH (ref 70–99)
Glucose-Capillary: 268 mg/dL — ABNORMAL HIGH (ref 70–99)

## 2024-04-02 LAB — BASIC METABOLIC PANEL WITH GFR
Anion gap: 7 (ref 5–15)
BUN: 13 mg/dL (ref 8–23)
CO2: 29 mmol/L (ref 22–32)
Calcium: 8.2 mg/dL — ABNORMAL LOW (ref 8.9–10.3)
Chloride: 94 mmol/L — ABNORMAL LOW (ref 98–111)
Creatinine, Ser: 0.57 mg/dL (ref 0.44–1.00)
GFR, Estimated: >60 mL/min (ref >60)
Glucose, Bld: 300 mg/dL — ABNORMAL HIGH (ref 70–99)
Potassium: 3.8 mmol/L (ref 3.5–5.1)
Sodium: 130 mmol/L — ABNORMAL LOW (ref 135–145)

## 2024-04-02 LAB — TSH: TSH: 5.052 u[IU]/mL — ABNORMAL HIGH (ref 0.350–4.500)

## 2024-04-02 LAB — T4, FREE: Free T4: 0.96 ng/dL (ref 0.61–1.12)

## 2024-04-02 MED ORDER — MORPHINE SULFATE (PF) 2 MG/ML IV SOLN
2.0000 mg | INTRAVENOUS | Status: DC | PRN
  Administered 2024-04-02 – 2024-04-03 (×2): 2 mg via INTRAVENOUS
  Filled 2024-04-02 (×2): qty 1

## 2024-04-02 MED ORDER — ACETAMINOPHEN 325 MG PO TABS
650.0000 mg | ORAL_TABLET | Freq: Four times a day (QID) | ORAL | Status: DC | PRN
  Administered 2024-04-02: 650 mg via ORAL
  Filled 2024-04-02: qty 2

## 2024-04-02 MED ORDER — ACETAMINOPHEN 650 MG RE SUPP: 650.0000 mg | Freq: Four times a day (QID) | RECTAL | Status: DC | PRN

## 2024-04-02 MED ORDER — FLORANEX PO PACK
1.0000 g | PACK | Freq: Three times a day (TID) | ORAL | Status: DC
  Administered 2024-04-02 – 2024-04-04 (×4): 1 g via ORAL
  Filled 2024-04-02 (×5): qty 1

## 2024-04-02 MED ORDER — QUETIAPINE FUMARATE 25 MG PO TABS
100.0000 mg | ORAL_TABLET | Freq: Every day | ORAL | Status: DC
  Administered 2024-04-02 – 2024-04-03 (×2): 100 mg via ORAL
  Filled 2024-04-02 (×2): qty 4

## 2024-04-02 MED ORDER — SODIUM CHLORIDE 0.9 % IV SOLN: INTRAVENOUS | Status: DC

## 2024-04-02 MED ORDER — OXYCODONE HCL 5 MG PO TABS
5.0000 mg | ORAL_TABLET | Freq: Four times a day (QID) | ORAL | Status: DC | PRN
  Administered 2024-04-02 (×2): 5 mg via ORAL
  Filled 2024-04-02 (×2): qty 1

## 2024-04-02 MED ORDER — GABAPENTIN 100 MG PO CAPS
100.0000 mg | ORAL_CAPSULE | Freq: Three times a day (TID) | ORAL | Status: DC
  Administered 2024-04-02 – 2024-04-04 (×7): 100 mg via ORAL
  Filled 2024-04-02 (×7): qty 1

## 2024-04-02 MED ORDER — LACTATED RINGERS IV BOLUS
1000.0000 mL | Freq: Once | INTRAVENOUS | Status: AC
  Administered 2024-04-02: 1000 mL via INTRAVENOUS

## 2024-04-02 MED ORDER — ONDANSETRON HCL 4 MG PO TABS: 4.0000 mg | ORAL_TABLET | Freq: Four times a day (QID) | ORAL | Status: DC | PRN

## 2024-04-02 MED ORDER — LEVOTHYROXINE SODIUM 75 MCG PO TABS
150.0000 ug | ORAL_TABLET | Freq: Every day | ORAL | Status: DC
  Administered 2024-04-03 – 2024-04-04 (×2): 150 ug via ORAL
  Filled 2024-04-02 (×3): qty 2

## 2024-04-02 MED ORDER — FLUOXETINE HCL 20 MG PO CAPS
20.0000 mg | ORAL_CAPSULE | Freq: Every day | ORAL | Status: DC
  Administered 2024-04-02 – 2024-04-04 (×3): 20 mg via ORAL
  Filled 2024-04-02 (×3): qty 1

## 2024-04-02 MED ORDER — ONDANSETRON HCL 4 MG/2ML IJ SOLN
4.0000 mg | Freq: Four times a day (QID) | INTRAMUSCULAR | Status: DC | PRN
  Administered 2024-04-03: 4 mg via INTRAVENOUS
  Filled 2024-04-02: qty 2

## 2024-04-02 MED ORDER — INSULIN ASPART 100 UNIT/ML IJ SOLN
0.0000 [IU] | Freq: Three times a day (TID) | INTRAMUSCULAR | Status: DC
  Administered 2024-04-02: 11 [IU] via SUBCUTANEOUS
  Administered 2024-04-02: 5 [IU] via SUBCUTANEOUS
  Administered 2024-04-03 (×2): 3 [IU] via SUBCUTANEOUS
  Administered 2024-04-03: 5 [IU] via SUBCUTANEOUS
  Administered 2024-04-04: 11 [IU] via SUBCUTANEOUS

## 2024-04-02 MED ORDER — LEVOTHYROXINE SODIUM 88 MCG PO TABS
88.0000 ug | ORAL_TABLET | Freq: Every day | ORAL | Status: DC
  Administered 2024-04-02: 88 ug via ORAL
  Filled 2024-04-02: qty 1

## 2024-04-02 MED ORDER — PANTOPRAZOLE SODIUM 40 MG PO TBEC
40.0000 mg | DELAYED_RELEASE_TABLET | Freq: Two times a day (BID) | ORAL | Status: DC
  Administered 2024-04-02 – 2024-04-04 (×5): 40 mg via ORAL
  Filled 2024-04-02 (×5): qty 1

## 2024-04-02 MED ORDER — SODIUM CHLORIDE 0.9 % IV SOLN
1.0000 g | Freq: Three times a day (TID) | INTRAVENOUS | Status: DC
  Administered 2024-04-02 – 2024-04-04 (×8): 1 g via INTRAVENOUS
  Filled 2024-04-02 (×8): qty 20

## 2024-04-02 MED ORDER — CHLORHEXIDINE GLUCONATE CLOTH 2 % EX PADS
6.0000 | MEDICATED_PAD | Freq: Every day | CUTANEOUS | Status: DC
  Administered 2024-04-02 – 2024-04-04 (×3): 6 via TOPICAL

## 2024-04-02 MED ORDER — ENOXAPARIN SODIUM 60 MG/0.6ML IJ SOSY: 55.0000 mg | PREFILLED_SYRINGE | INTRAMUSCULAR | Status: DC

## 2024-04-02 NOTE — Hospital Course (Addendum)
 [name] is a 68 y.o. female with medical history significant of rectal cancer stage 2C under outpatient palliative care, hypothyroidism, T2DM, hypertension., obesity and chronic anemia who presented with altered mental status.  Patient has a very poor functional physical capacity, she has been bed bound for many months, due to generalized weakness. Her cancer therapy has been held due to deconditioning and she is under palliative care services.  She has a chronic foley catheter and recurrent urinary tract infections.    Today around dinner time she became less responsive to her daughter, very lethargic and slow to respond.  Apparently she had been at her baseline until then.  Because severe symptoms her daughter called EMS, she was found hypotensive 90/51 and tachycardic 110 to 120 bpm, received IV fluids 500 ml and was transported to the ED.    At the time of my examination patient denies any chest pain or dyspnea, no fever or chills, no nausea, vomiting or diarrhea.  Continue feeling very weak.      Assessment & Plan:   Principal Problem: Sepsis    Hypotension UTI   Active Problems:   Hypothyroidism following radioiodine therapy   Type 2 diabetes mellitus with complication, with long-term current use of insulin  (HCC)   GERD (gastroesophageal reflux disease)   Rectal cancer (HCC)   Obesity, class 2   Iron  deficiency anemia   Depression   Sepsis secondary to UTI (HCC)   Assessment and Plan: *Sepsis due to UTI - hypotension - Met sepsis criteria on admission with hypotension, hypothermia, tachycardia, lactic acidosis   - Continue IV fluid resuscitation, IV antibiotics  - urinary tract infections, related to chronic indwelling foley catheter - Prior cultures positive for Klebsiella pneumonia ESBL.  Noted port A cath in place.  -Will follow the blood and urine cultures     Hypothyroidism following radioiodine therapy TSH elevated to 5.052  Continue levothyroxine   increasing dose to 150 mcg -  check T3 and T4 Considering her somnolence and peripheral edema, need to rule out symptomatic hypothyroidism, myxedema.     Type 2 diabetes mellitus  - With history of long-acting insulin  use, -Monitoring blood sugars, checking CBG q. ACHS, SSI coverage Uncontrolled hyperglycemia.    GERD (gastroesophageal reflux disease) Continue with pantoprazole .    Rectal cancer [facility]) Patient not longer under therapy due to severe deconditioning.  Patient under palliative care services as outpatient Advanced directives DNR   Obesity, class 2 Calculated BMI is 38,1    Depression Chronic pain syndrome.  -Resuming fluoxetine  and gabapentin  Hold on cyclobenzaprine, pregabalin  and quetiapine  for now due to somnolence and acute metabolic/ toxic encephalopathy.       Advance Care Planning:   Code Status: Limited: Do not attempt resuscitation (DNR) -DNR-LIMITED -Do Not Intubate/DNI

## 2024-04-02 NOTE — Assessment & Plan Note (Addendum)
 Multifactorial hypotension, likely hypovolemic to rule out sepsis.   Plan for IV fluids with isotonic saline at 100 ml per hr Considering lactic acid elevation will continue antibiotic therapy with meropenem, (high risk for urinary tract infections, related to chronic indwelling foley catheter). Prior cultures positive for Klebsiella pneumonia ESBL.  Noted port A cath in place.  Follow up cell count, cultures and temperature curve.

## 2024-04-02 NOTE — Progress Notes (Signed)
 Patient rates 10/10 pain. Provided oral pain medication, but patient states that 5mg  will not help her pain at this point as she has gone without pain management all shift. She asked for the IV morphine . Provided as patient vitals stable and patient clearly uncomfortable. Patient family is at bedside.

## 2024-04-02 NOTE — Progress Notes (Signed)
 PROGRESS NOTE    Patient: [name]                            PCP: Audria Leather, MD                    DOB: 02/17/1956            DOA: 04/01/2024 ZOX:[id number]             DOS: 04/02/2024, 10:28 AM   LOS: 0 days   Date of Service: The patient was seen and examined on 04/02/2024  Subjective:   The patient was seen and examined this morning. Hemodynamically stable. No issues overnight .  Brief Narrative:   [name] is a 68 y.o. female with medical history significant of rectal cancer stage 2C under outpatient palliative care, hypothyroidism, T2DM, hypertension., obesity and chronic anemia who presented with altered mental status.  Patient has a very poor functional physical capacity, she has been bed bound for many months, due to generalized weakness. Her cancer therapy has been held due to deconditioning and she is under palliative care services.  She has a chronic foley catheter and recurrent urinary tract infections.    Today around dinner time she became less responsive to her daughter, very lethargic and slow to respond.  Apparently she had been at her baseline until then.  Because severe symptoms her daughter called EMS, she was found hypotensive 90/51 and tachycardic 110 to 120 bpm, received IV fluids 500 ml and was transported to the ED.    At the time of my examination patient denies any chest pain or dyspnea, no fever or chills, no nausea, vomiting or diarrhea.  Continue feeling very weak.      Assessment & Plan:   Principal Problem: Sepsis    Hypotension UTI   Active Problems:   Hypothyroidism following radioiodine therapy   Type 2 diabetes mellitus with complication, with long-term current use of insulin  (HCC)   GERD (gastroesophageal reflux disease)   Rectal cancer (HCC)   Obesity, class 2   Iron  deficiency anemia   Depression   Sepsis secondary to UTI (HCC)   Assessment and Plan: *Sepsis due to UTI - hypotension - Met sepsis criteria on  admission with hypotension, hypothermia, tachycardia, lactic acidosis   - Continue IV fluid resuscitation, IV antibiotics  - urinary tract infections, related to chronic indwelling foley catheter - Prior cultures positive for Klebsiella pneumonia ESBL.  Noted port A cath in place.  -Will follow the blood and urine cultures     Hypothyroidism following radioiodine therapy TSH elevated to 5.052  Continue levothyroxine  increasing dose to 150 mcg -  check T3 and T4 Considering her somnolence and peripheral edema, need to rule out symptomatic hypothyroidism, myxedema.     Type 2 diabetes mellitus  - With history of long-acting insulin  use, -Monitoring blood sugars, checking CBG q. ACHS, SSI coverage Uncontrolled hyperglycemia.    GERD (gastroesophageal reflux disease) Continue with pantoprazole .    Rectal cancer [facility]) Patient not longer under therapy due to severe deconditioning.  Patient under palliative care services as outpatient Advanced directives DNR   Obesity, class 2 Calculated BMI is 38,1    Depression Chronic pain syndrome.  -Resuming fluoxetine  and gabapentin  Hold on cyclobenzaprine, pregabalin  and quetiapine  for now due to somnolence and acute metabolic/ toxic encephalopathy.       Advance Care Planning:   Code Status: Limited:  Do not attempt resuscitation (DNR) -DNR-LIMITED -Do Not Intubate/DNI    -------------------------------------------------------------------------------------------------------------------------------------------- Nutritional status:  The patient's BMI is: Body mass index is 35.23 kg/m. I agree with the assessment and plan as outlined   Skin Assessment: I have examined the patient's skin and I agree with the wound assessment as performed by wound care team As outlined belowe: Pressure Injury 06/01/23 Coccyx Stage 2 -  Partial thickness loss of dermis presenting as a shallow open injury with a red, pink wound bed without slough. redness  10cm x 3 cm (Active)  06/01/23 1607  Location: Coccyx  Location Orientation:   Staging: Stage 2 -  Partial thickness loss of dermis presenting as a shallow open injury with a red, pink wound bed without slough.  Wound Description (Comments): redness 10cm x 3 cm  Present on Admission: Yes     ---------------------------------------------------------------------------------------------------------------------------------------------------- Cultures;  Urine Culture  >>>   ------------------------------------------------------------------------------------------------------------------------------------------------  DVT prophylaxis:  SCDs Start: 04/02/24 0246   Code Status:   Code Status: Limited: Do not attempt resuscitation (DNR) -DNR-LIMITED -Do Not Intubate/DNI   Family Communication:  -Advance care planning has been discussed.   Admission status:   Status is: Observation The patient remains OBS appropriate and will d/c before 2 midnights.   Disposition: From  - home             Planning for discharge in 2 days: to hospice  Procedures:   No admission procedures for hospital encounter.   Antimicrobials:  Anti-infectives (From admission, onward)    Start     Dose/Rate Route Frequency Ordered Stop   04/02/24 0200  meropenem (MERREM) 1 g in sodium chloride  0.9 % 100 mL IVPB        1 g 200 mL/hr over 30 Minutes Intravenous Every 8 hours 04/02/24 0147     04/01/24 2300  vancomycin  (VANCOREADY) IVPB 2000 mg/400 mL        2,000 mg 200 mL/hr over 120 Minutes Intravenous  Once 04/01/24 2257 04/02/24 0109   04/01/24 2245  ceFEPIme  (MAXIPIME ) 2 g in sodium chloride  0.9 % 100 mL IVPB        2 g 200 mL/hr over 30 Minutes Intravenous  Once 04/01/24 2239 04/02/24 0143        Medication:   insulin  aspart  0-15 Units Subcutaneous TID WC   lactobacillus  1 g Oral TID WC   levothyroxine   150 mcg Oral Q0600   pantoprazole   40 mg Oral BID AC    acetaminophen  **OR**  acetaminophen , ondansetron  **OR** ondansetron  (ZOFRAN ) IV   Objective:   Vitals:   04/02/24 0216 04/02/24 0218 04/02/24 0245 04/02/24 0739  BP:   112/80 (!) 106/50  Pulse: 97 98 (!) 103 (!) 103  Resp: 10 10 20 18   Temp:   97.9 F (36.6 C) 98.2 F (36.8 C)  TempSrc:   Oral Oral  SpO2: 98% 97% 94% 99%  Weight:   105.1 kg   Height:   5\' 8"  (1.727 m)     Intake/Output Summary (Last 24 hours) at 04/02/2024 1028 Last data filed at 04/02/2024 0940 Gross per 24 hour  Intake 1913.2 ml  Output --  Net 1913.2 ml   Filed Weights   04/01/24 2147 04/02/24 0245  Weight: 113.4 kg 105.1 kg     Physical examination:   Constitution:  Alert, cooperative, complaining of lower extremity pain Psychiatric:   Awake alert, depressed mood, crying-  HEENT:        Normocephalic, PERRL, otherwise  with in Normal limits  Chest:         Chest symmetric Cardio vascular:  S1/S2, RRR, No murmure, No Rubs or Gallops  pulmonary: Clear to auscultation bilaterally, respirations unlabored, negative wheezes / crackles Abdomen: Soft, non-tender, non-distended, bowel sounds,no masses, no organomegaly Muscular skeletal: Limited exam -bedbound, lower extremity contractures, +2 edema minimal movement bilateral, able to move upper extremities Neuro: Limited exam - CNII-XII intact. , normal motor and sensation, reflexes intact  Extremities: +2 pitting edema lower extremities, +2 pulses  Skin: Dry, warm to touch, negative for any Rashes, No open wounds Wounds: per nursing documentation Pressure Injury 06/01/23 Coccyx Stage 2 -  Partial thickness loss of dermis presenting as a shallow open injury with a red, pink wound bed without slough. redness 10cm x 3 cm (Active)  06/01/23 1607  Location: Coccyx  Location Orientation:   Staging: Stage 2 -  Partial thickness loss of dermis presenting as a shallow open injury with a red, pink wound bed without slough.  Wound Description (Comments): redness 10cm x 3 cm  Present on  Admission: Yes         ------------------------------------------------------------------------------------------------------------------------------------------    LABs:     Latest Ref Rng & Units 04/02/2024    4:04 AM 04/01/2024   10:30 PM 11/21/2023    3:32 PM  CBC  WBC 4.0 - 10.5 K/uL 6.7  6.2  4.0   Hemoglobin 12.0 - 15.0 g/dL 16.1  09.6  04.5   Hematocrit 36.0 - 46.0 % 36.8  44.4  38.0   Platelets 150 - 400 K/uL 143  106  101       Latest Ref Rng & Units 04/02/2024    4:04 AM 04/01/2024   10:30 PM 11/21/2023    3:32 PM  CMP  Glucose 70 - 99 mg/dL 409  811  914   BUN 8 - 23 mg/dL 13  12  25    Creatinine 0.44 - 1.00 mg/dL 7.82  9.56  2.13   Sodium 135 - 145 mmol/L 130  133  136   Potassium 3.5 - 5.1 mmol/L 3.8  3.8  4.4   Chloride 98 - 111 mmol/L 94  94  103   CO2 22 - 32 mmol/L 29  28  25    Calcium  8.9 - 10.3 mg/dL 8.2  8.5  8.8   Total Protein 6.5 - 8.1 g/dL  6.5    Total Bilirubin 0.0 - 1.2 mg/dL  0.5    Alkaline Phos 38 - 126 U/L  109    AST 15 - 41 U/L  13    ALT 0 - 44 U/L  7         Micro Results No results found for this or any previous visit (from the past 240 hours).  Radiology Reports CT CHEST ABDOMEN PELVIS WO CONTRAST Result Date: 04/02/2024 CLINICAL DATA:  Sepsis. AMS after family noticed pt had a decreased LOC when they came back from running errands today. Pt currently on hosp[facility] care [facility]) and has hx of rectal cancer. Pt also has hx of UTI's and has an indwelling fole EXAM: CT CHEST, ABDOMEN AND PELVIS WITHOUT CONTRAST TECHNIQUE: Multidetector CT imaging of the chest, abdomen and pelvis was performed following the standard protocol without IV contrast. RADIATION DOSE REDUCTION: This exam was performed according to the departmental dose-optimization program which includes automated exposure control, adjustment of the mA and/or kV according to patient size and/or use of iterative reconstruction technique. COMPARISON:  CT abdomen pelvis  10/11/2023 FINDINGS: CT CHEST FINDINGS Cardiovascular: Right chest wall Port-A-Cath with tip in the distal superior vena cava. Normal heart size. No significant pericardial effusion. The thoracic aorta is normal in caliber. Mild atherosclerotic plaque of the thoracic aorta. At least 2 vessel coronary artery calcifications. Aortic valve calcifications. Mediastinum/Nodes: No gross hilar adenopathy, noting limited sensitivity for the detection of hilar adenopathy on this noncontrast study. No enlarged mediastinal, hilar, or axillary lymph nodes. Thyroid  gland, trachea, and esophagus demonstrate no significant findings. Lungs/Pleura: Expiratory phase of respiration. Diffuse bronchial wall thickening. No focal consolidation. No pulmonary nodule. No pulmonary mass. No pleural effusion. No pneumothorax. Musculoskeletal: Partially visualized left breast with dermal thickening and subcutaneus soft tissue edema. No suspicious lytic or blastic osseous lesions. No acute displaced fracture. CT ABDOMEN PELVIS FINDINGS Hepatobiliary: No focal liver abnormality. Calcified gallstone noted within the gallbladder lumen. No gallbladder wall thickening or pericholecystic fluid. No biliary dilatation. Pancreas: Diffusely atrophic. No focal lesion. Otherwise normal pancreatic contour. No surrounding inflammatory changes. No main pancreatic ductal dilatation. Spleen: Spleen is enlarged measuring up to 16 cm. No focal splenic lesion. Adrenals/Urinary Tract: No adrenal nodule bilaterally. No nephrolithiasis and no hydronephrosis. Fluid density lesions within the kidneys likely represent simple renal cysts. Simple renal cysts, in the absence of clinically indicated signs/symptoms, require no independent follow-up. No ureterolithiasis or hydroureter. The urinary bladder is decompressed with Foley catheter tip and inflated balloon within the lumen. Stomach/Bowel: Stomach is within normal limits. No evidence of small bowel wall thickening or  dilatation. No large bowel dilatation. Interval worsening of irregular rectal wall thickening (2:15). Hypertrophied ileocecal valve. Stool throughout the colon. The appendix is not definitely identified with no inflammatory changes in the right lower quadrant to suggest acute appendicitis. Vascular/Lymphatic: No abdominal aorta or iliac aneurysm. Mild atherosclerotic plaque of the aorta and its branches. No abdominal, pelvic, or inguinal lymphadenopathy. Reproductive: Status post hysterectomy. No adnexal masses. Other: No intraperitoneal free fluid. No intraperitoneal free gas. No organized fluid collection. Musculoskeletal: Small fat containing right inguinal hernia. No suspicious lytic or blastic osseous lesions. No acute displaced fracture. Multilevel degenerative changes of the spine. IMPRESSION: 1. No acute intrathoracic abnormality. 2. Partially visualized left breast with dermal thickening and subcutaneus soft tissue edema. Correlate with physical exam. 3. Interval worsening of rectal wall thickening concerning for recurrent malignancy. Markedly limited evaluation on this noncontrast study. Recommend further evaluation with colonoscopy or MRI with and without contrast. 4. Splenomegaly. 5. Cholelithiasis no acute cholecystitis. 6. Stool throughout the colon-correlate for constipation. 7. Aortic Atherosclerosis (ICD10-I70.0). 8. Limited evaluation on this noncontrast study. Electronically Signed   By: Morgane  Naveau M.D.   On: 04/02/2024 00:22   DG Chest Port 1 View Result Date: 04/01/2024 CLINICAL DATA:  Questionable sepsis - evaluate for abnormality EXAM: PORTABLE CHEST 1 VIEW COMPARISON:  Chest x-ray 11/20/2023, CT abdomen pelvis of 10/11/2023 FINDINGS: Patient is rotated. Similar-appearing right chest wall Port-A-Cath with tip overlying the expected region of the superior vena cava. The heart and mediastinal contours are unchanged. Low lung volumes. Question developing retrocardiac airspace opacity. No  pulmonary edema. No pleural effusion. No pneumothorax. No acute osseous abnormality. IMPRESSION: Low lung volumes with question retrocardiac airspace opacity. Finding could represent developing infection or atelectasis. Limited evaluation due to patient rotation and low lung volumes. Consider repeat PA and lateral view of the chest for further evaluation. Electronically Signed   By: Morgane  Naveau M.D.   On: 04/01/2024 22:33    SIGNED: Bobbetta Burnet, MD, FHM. FAAFP. [facility] Time spent - 55 min.  In seeing, evaluating and examining the patient. Reviewing medical records, labs, drawn plan of care. [facility],  Pager (please use [email or web address] to page/ text) Please use Epic Secure Chat for non-urgent communication (7AM-7PM)  If 7PM-7AM, please contact night-coverage [email or web address], 04/02/2024, 10:28 AM

## 2024-04-02 NOTE — Assessment & Plan Note (Signed)
 Continue levothyroxine  and check TSH with free T4,  Considering her somnolence and peripheral edema, need to rule out symptomatic hypothyroidism, myxedema.

## 2024-04-02 NOTE — Assessment & Plan Note (Signed)
 Patient not longer under therapy due to severe deconditioning.  Patient under palliative care services as outpatient Advanced directives DNR

## 2024-04-02 NOTE — TOC Initial Note (Signed)
 Transition of Care [facility]) - Initial/Assessment Note    Patient Details  Name: [name] MRN: [id number] Date of Birth: September 15, 1956  Transition of Care [facility]) CM/SW Contact:    Lynda Sands, RN Phone Number: 04/02/2024, 1:20 PM  Clinical Narrative:  CM met with patient at bedside. Patient reports her daughter and grandson live with her.   Patient  has [facility]  providing care. Patient has medicaid nursing aides that come to her home to provide personal care 3 times a week. Patient owns DME: rollator, wheelchair and hospital bed. Patient will need ambulance transport home.        Expected Discharge Plan: Home w Hospice Care Barriers to Discharge: Continued Medical Work up   Patient Goals and CMS Choice Patient states their goals for this hospitalization and ongoing recovery are:: Home with Hospice care      Expected Discharge Plan and Services     Post Acute Care Choice: Hospice Living arrangements for the past 2 months: Single Family Home                   Prior Living Arrangements/Services Living arrangements for the past 2 months: Single Family Home Lives with:: Adult Children Patient language and need for interpreter reviewed:: No          Care giver support system in place?: Yes (comment)      Activities of Daily Living   ADL Screening (condition at time of admission) Independently performs ADLs?: No Does the patient have a NEW difficulty with bathing/dressing/toileting/self-feeding that is expected to last >3 days?: No Does the patient have a NEW difficulty with getting in/out of bed, walking, or climbing stairs that is expected to last >3 days?: No Does the patient have a NEW difficulty with communication that is expected to last >3 days?: No Is the patient deaf or have difficulty hearing?: No Does the patient have difficulty seeing, even when wearing glasses/contacts?: No Does the patient have difficulty concentrating, remembering, or making  decisions?: No  Permission Sought/Granted      Share Information with NAME: April  Winchester        Permission granted to share info w Contact Information: [phone]  Emotional Assessment Appearance:: Appears stated age Attitude/Demeanor/Rapport: Engaged Affect (typically observed): Stable Orientation: : Oriented to Self, Oriented to Place, Oriented to  Time, Oriented to Situation      Admission diagnosis:  Hypotension [I95.9] Altered mental status, unspecified altered mental status type [R41.82] Sepsis [facility]) [A41.9] Patient Active Problem List   Diagnosis Date Noted   Hypotension 04/02/2024   Depression 04/02/2024   Sepsis (HCC) 04/02/2024   Sepsis secondary to UTI (HCC) 06/02/2023   Pressure injury of skin 06/02/2023   Weakness 06/01/2023   Emesis, persistent 04/13/2023   Rectal bleeding 01/27/2023   Acute blood loss anemia 01/27/2023   Pancytopenia (HCC) 01/27/2023   Rectal cancer (HCC) 01/03/2023   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   Vomiting and diarrhea 09/11/2018   Hypokalemia 09/11/2018   Hyponatremia 09/11/2018   Gastroenteritis 09/11/2018   Chest pain 08/17/2018   Mild aortic stenosis 08/17/2018   Iron  deficiency anemia 10/05/2017   Recurrent major depressive disorder, in partial remission (HCC) 10/06/2016   Mixed hyperlipidemia 02/01/2016   Hyperthyroidism 02/01/2016   Arthritis 02/01/2016   GERD (gastroesophageal reflux disease) 02/01/2016   Hypertension 02/01/2016   Uncontrolled type 2 diabetes mellitus with peripheral neuropathy 02/01/2016   Vitamin D  deficiency 02/01/2016   Palpitations 04/03/2011  Obesity, class 2 04/03/2011   Essential hypertension, benign 04/03/2011   Type 2 diabetes mellitus with complication, with long-term current use of insulin  (HCC) 04/03/2011   PCP:  Audria Leather, MD Pharmacy:   [facility] - [place], [place] - [address]  [place] [address] Phone: [phone] Fax: [phone]  [facility] - [place], [place] - [address] [place] [address] Phone: [phone] Fax: [phone]     Social Drivers of Health (SDOH) Social History: SDOH Screenings   Food Insecurity: No Food Insecurity (04/02/2024)  Housing: Low Risk  (04/02/2024)  Transportation Needs: No Transportation Needs (04/02/2024)  Utilities: Not At Risk (04/02/2024)  Depression (PHQ2-9): Low Risk  (01/06/2023)  Social Connections: Moderately Isolated (04/02/2024)  Tobacco Use: Low Risk  (04/01/2024)   SDOH Interventions:     Readmission Risk Interventions    06/07/2023   11:28 AM  Readmission Risk Prevention Plan  Transportation Screening Complete  PCP or Specialist Appt within 3-5 Days Complete  HRI or Home Care Consult Complete  Social Work Consult for Recovery Care Planning/Counseling Complete  Palliative Care Screening Not Applicable  Medication Review [employer]) Complete

## 2024-04-02 NOTE — H&P (Addendum)
 History and Physical    Patient: [name] ZOX:[id number] DOB: 08-30-1956 DOA: 04/01/2024 DOS: the patient was seen and examined on 04/02/2024 PCP: Audria Leather, MD  Patient coming from: Home  Chief Complaint:  Chief Complaint  Patient presents with   Altered Mental Status   HPI: [name] is a 68 y.o. female with medical history significant of rectal cancer stage 2C under outpatient palliative care, hypothyroidism, T2DM, hypertension., obesity and chronic anemia who presented with altered mental status.  Patient has a very poor functional physical capacity, she has been bed bound for many months, due to generalized weakness. Her cancer therapy has been held due to deconditioning and she is under palliative care services.  She has a chronic foley catheter and recurrent urinary tract infections.   Today around dinner time she became less responsive to her daughter, very lethargic and slow to respond.  Apparently she had been at her baseline until then.  Because severe symptoms her daughter called EMS, she was found hypotensive 90/51 and tachycardic 110 to 120 bpm, received IV fluids 500 ml and was transported to the ED.   At the time of my examination patient denies any chest pain or dyspnea, no fever or chills, no nausea, vomiting or diarrhea.  Continue feeling very weak.    Review of Systems: As mentioned in the history of present illness. All other systems reviewed and are negative. Past Medical History:  Diagnosis Date   Anxiety    Cancer (HCC)    rectal   Coronary atherosclerosis    Cardiac CT 09/2018 with calcium  score 8.7 and mild proximal LAD disease   Essential hypertension 2009   GERD (gastroesophageal reflux disease)    Hypothyroidism    Iron  deficiency anemia    Osteoarthritis    Palpitations    Tachypalpitations on beta blockers since 2010   Type 2 diabetes mellitus (HCC) 2003   Vitamin B 12 deficiency    Vitamin D  deficiency    Past Surgical History:   Procedure Laterality Date   ABDOMINAL HYSTERECTOMY     History of cervical cancer   BIOPSY  12/16/2022   Procedure: BIOPSY;  Surgeon: Suzette Espy, MD;  [facility];  Service: Endoscopy;;  gastric, rectal   BREAST BIOPSY Right    COLONOSCOPY WITH PROPOFOL  N/A 12/16/2022   Procedure: COLONOSCOPY WITH PROPOFOL ;  Surgeon: Suzette Espy, MD;  [facility]: [facility];  Service: Endoscopy;  Laterality: N/A;  10:45 am   ESOPHAGOGASTRODUODENOSCOPY (EGD) WITH PROPOFOL  N/A 12/16/2022   Procedure: ESOPHAGOGASTRODUODENOSCOPY (EGD) WITH PROPOFOL ;  Surgeon: Suzette Espy, MD;  [facility]: [facility];  Service: Endoscopy;  Laterality: N/A;   IR IMAGING GUIDED PORT INSERTION  01/15/2023   MALONEY DILATION N/A 12/16/2022   Procedure: Londa Rival DILATION;  Surgeon: Suzette Espy, MD;  [facility]: [facility];  Service: Endoscopy;  Laterality: N/A;   POLYPECTOMY  12/16/2022   Procedure: POLYPECTOMY;  Surgeon: Suzette Espy, MD;  [facility]: [facility];  Service: Endoscopy;;   TONSILLECTOMY     Social History:  reports that she has never smoked. She has never used smokeless tobacco. She reports that she does not drink alcohol and does not use drugs.  Allergies  Allergen Reactions   Contrast Media [Iodinated Contrast Media] Other (See Comments)    Burning   Sulfa Antibiotics Nausea And Vomiting    Other reaction(s): GI Upset (intolerance) unknown   Doxepin Rash and Swelling   Tape Rash  Family History  Problem Relation Age of Onset   Heart failure Mother    Diabetes Father    Hypertension Father    Stroke Sister 26   Diabetes Brother    Cancer - Colon Other        age 61   Colon polyps Neg Hx     Prior to Admission medications   Medication Sig Start Date End Date Taking? Authorizing Provider  blood glucose meter kit and supplies KIT Dispense based on patient and insurance preference. Use up to four times daily as directed. (FOR ICD-10 E11.65) Number of Strips 400 Number  of Lancets 400 02/16/23   Nida, Gebreselassie W, MD  Blood Glucose Monitoring Suppl (TRUE METRIX METER) w/Device KIT 1 each by Does not apply route 2 (two) times daily. Use to test BG bid E11.65 03/19/23   Wendel Hals, NP  ciprofloxacin  (CIPRO ) 500 MG tablet Take 1 tablet (500 mg total) by mouth 2 (two) times daily. Patient not taking: Reported on 11/21/2023 10/11/23   Trish Furl, MD  cyclobenzaprine (FLEXERIL) 10 MG tablet Take 10 mg by mouth 2 (two) times daily as needed for muscle spasms.    [provider]  dicyclomine  (BENTYL ) 10 MG capsule TAKE 1 CAPSULE EVERY 8 HOURS AS NEEDED FOR SPASMS. 02/14/24   Paulett Boros, MD  diphenoxylate -atropine  (LOMOTIL ) 2.5-0.025 MG tablet Take 1 tablet by mouth 4 (four) times daily. Patient not taking: Reported on 11/21/2023 03/25/23   Sonnie Dusky, PA-C  FLUoxetine  (PROZAC ) 20 MG capsule Take 20 mg by mouth daily.    [provider]  hydrOXYzine  (VISTARIL ) 25 MG capsule Take 1 capsule (25 mg total) by mouth 2 (two) times daily as needed for anxiety. 12/14/23   Paulett Boros, MD  Insulin  Pen Needle (DROPLET PEN NEEDLES) 31G X 5 MM MISC USE AS INSTRUCTED TO INJECT INSULIN  FOUR TIMES DAILY 05/26/23   Wendel Hals, NP  levothyroxine  (SYNTHROID ) 88 MCG tablet TAKE ONE TABLET BY MOUTH DAILY BEFORE BREAKFAST 12/16/23   Wendel Hals, NP  magnesium  oxide (MAG-OX) 400 (240 Mg) MG tablet Take 2 tablets (800 mg total) by mouth daily. 05/19/23   Paulett Boros, MD  Misc. Devices (ROLLATOR ULTRA-LIGHT) MISC Rollator walker 04/27/23   [provider]  oxyCODONE  (OXY IR/ROXICODONE ) 5 MG immediate release tablet Take 1 tablet (5 mg total) by mouth every 12 (twelve) hours as needed for severe pain (pain score 7-10). 12/09/23   Paulett Boros, MD  pantoprazole  (PROTONIX ) 40 MG tablet Take 1 tablet (40 mg total) by mouth 2 (two) times daily before a meal. 04/13/23   Emokpae, Courage, MD  pregabalin  (LYRICA ) 100 MG  capsule Take 100 mg by mouth 2 (two) times daily.    [provider]  QUEtiapine  (SEROQUEL ) 100 MG tablet Take 1 tablet (100 mg total) by mouth at bedtime. 12/21/23   Paulett Boros, MD  TRUE METRIX BLOOD GLUCOSE TEST test strip TEST BLOOD SUGAR TWICE DAILY 11/10/22   Baby Bolt, MD    Physical Exam: Vitals:   04/02/24 0030 04/02/24 0033 04/02/24 0045 04/02/24 0100  BP: 119/76  117/71 129/68  Pulse: (!) 102 99 100 100  Resp: 10 10 10 12   Temp:      TempSrc:      SpO2: 98% 99% 99% 98%  Weight:      Height:       Neurology somnolent but easy arouse, follows commands and responds to simple questions, she is orientated times place, person  and time  ENT with positive pallor with no icterus, dry mucous membranes Cardiovascular with S1 and S2 present and regular with no gallops, rubs or murmurs Respiratory with no rales or wheezing, no rhonchi, on anterior auscultation  Abdomen with no distention Positive bilateral lower extremity edema, moderate in intensity.   Data Reviewed:   Na 133, K 3,8 Cl 94 bicarbonate 28 glucose 237 bun 12 cr 0,54  AST 13 and ALT 7  Lactic acid 1,6 and 2,5  Wbc 6.2 hgb 13.6 plt 106   Urine analysis SG 1,022, protein > 300, moderate leukocytes and large hgb, 21-50 wbc   Chest radiograph with  hypoinflation and left rotation, bilateral atelectasis, with possible retrocardiac infiltrate. No effusions.   EKG 109 bpm, right axis deviation, qtc 485, sinus rhythm with no significant ST segment or T wave changes.   Assessment and Plan: * Hypotension Multifactorial hypotension, likely hypovolemic to rule out sepsis.   Plan for IV fluids with isotonic saline at 100 ml per hr Considering lactic acid elevation will continue antibiotic therapy with meropenem, (high risk for urinary tract infections, related to chronic indwelling foley catheter). Prior cultures positive for Klebsiella pneumonia ESBL.  Noted port A cath in place.  Follow up  cell count, cultures and temperature curve.    Hypothyroidism following radioiodine therapy Continue levothyroxine  and check TSH with free T4,  Considering her somnolence and peripheral edema, need to rule out symptomatic hypothyroidism, myxedema.    Type 2 diabetes mellitus with complication, with long-term current use of insulin  (HCC) Continue glucose cover and monitoring with insulin  sliding scale  Uncontrolled hyperglycemia.   GERD (gastroesophageal reflux disease) Continue with pantoprazole .   Rectal cancer (HCC) Patient not longer under therapy due to severe deconditioning.  Patient under palliative care services as outpatient Advanced directives DNR  Obesity, class 2 Calculated BMI is 38,1   Depression Chronic pain syndrome.  Hold on cyclobenzaprine, fluoxetine , pregabalin  and quetiapine  for now due to somnolence and acute metabolic/ toxic encephalopathy.     Advance Care Planning:   Code Status: Limited: Do not attempt resuscitation (DNR) -DNR-LIMITED -Do Not Intubate/DNI    Consults: none   Family Communication: I spoke with patient's daughter at the bedside, we talked in detail about patient's condition, plan of care and prognosis and all questions were addressed.   Severity of Illness: The appropriate patient status for this patient is OBSERVATION. Observation status is judged to be reasonable and necessary in order to provide the required intensity of service to ensure the patient's safety. The patient's presenting symptoms, physical exam findings, and initial radiographic and laboratory data in the context of their medical condition is felt to place them at decreased risk for further clinical deterioration. Furthermore, it is anticipated that the patient will be medically stable for discharge from the hospital within 2 midnights of admission.   Author: Albertus Alt, MD 04/02/2024 1:05 AM  For on call review [email or web address].

## 2024-04-02 NOTE — Assessment & Plan Note (Signed)
 Continue with pantoprazole/.

## 2024-04-02 NOTE — Assessment & Plan Note (Signed)
 Chronic pain syndrome.  Hold on cyclobenzaprine, fluoxetine , pregabalin  and quetiapine  for now due to somnolence and acute metabolic/ toxic encephalopathy.

## 2024-04-02 NOTE — Assessment & Plan Note (Signed)
 Continue glucose cover and monitoring with insulin  sliding scale  Uncontrolled hyperglycemia.

## 2024-04-02 NOTE — Progress Notes (Signed)
   04/02/24 1314  TOC Assessment  TOC screening is complete Yes  Once discharged, how will the patient get to their discharge location? Ambulance  Expected Discharge Plan Home w Hospice Care  Final next level of care Home w Hospice Care  Barriers to Discharge Continued Medical Work up  Patient states their goals for this hospitalization and ongoing recovery are: Home with Hospice care  Post Acute Care [facility]  Living arrangements for the past 2 months Single Family Home  Lives with: Adult Children  Share Information with NAME April  [place]  Permission granted to share info w Contact Information [phone]  Patient language and need for interpreter reviewed: No  Care giver support system in place? Y  Appearance: Appears stated age  Attitude/Demeanor/Rapport Engaged  Affect (typically observed) Stable  Orientation:  Oriented to Self;Oriented to Place;Oriented to  Time;Oriented to Situation   Admitted with Hypotension.  Transition of Care Department [facility]) has reviewed patient and  TOC needs have been identified at this time. We will continue to monitor patient advancement through interdisciplinary progression rounds.

## 2024-04-02 NOTE — Care Management Obs Status (Signed)
 MEDICARE OBSERVATION STATUS NOTIFICATION   Patient Details  Name: ORETA THORNGREN MRN: [id number] Date of Birth: 1955-12-07   Medicare Observation Status Notification Given:  Yes     Lynda Sands, RN 04/02/2024, 8:13 AM

## 2024-04-02 NOTE — Assessment & Plan Note (Signed)
Calculated BMI is 38,1

## 2024-04-03 ENCOUNTER — Encounter (HOSPITAL_COMMUNITY): Payer: Self-pay | Admitting: Family Medicine

## 2024-04-03 DIAGNOSIS — N390 Urinary tract infection, site not specified: Secondary | ICD-10-CM

## 2024-04-03 DIAGNOSIS — Z7189 Other specified counseling: Secondary | ICD-10-CM

## 2024-04-03 DIAGNOSIS — A419 Sepsis, unspecified organism: Secondary | ICD-10-CM | POA: Diagnosis not present

## 2024-04-03 DIAGNOSIS — Z515 Encounter for palliative care: Secondary | ICD-10-CM

## 2024-04-03 LAB — BASIC METABOLIC PANEL WITH GFR
Anion gap: 10 (ref 5–15)
BUN: 11 mg/dL (ref 8–23)
CO2: 27 mmol/L (ref 22–32)
Calcium: 8 mg/dL — ABNORMAL LOW (ref 8.9–10.3)
Chloride: 97 mmol/L — ABNORMAL LOW (ref 98–111)
Creatinine, Ser: 0.53 mg/dL (ref 0.44–1.00)
GFR, Estimated: >60 mL/min (ref >60)
Glucose, Bld: 175 mg/dL — ABNORMAL HIGH (ref 70–99)
Potassium: 3.3 mmol/L — ABNORMAL LOW (ref 3.5–5.1)
Sodium: 134 mmol/L — ABNORMAL LOW (ref 135–145)

## 2024-04-03 LAB — GLUCOSE, CAPILLARY
Glucose-Capillary: 187 mg/dL — ABNORMAL HIGH (ref 70–99)
Glucose-Capillary: 195 mg/dL — ABNORMAL HIGH (ref 70–99)
Glucose-Capillary: 233 mg/dL — ABNORMAL HIGH (ref 70–99)
Glucose-Capillary: 320 mg/dL — ABNORMAL HIGH (ref 70–99)

## 2024-04-03 LAB — HEMOGLOBIN A1C
Hgb A1c MFr Bld: 11 % — ABNORMAL HIGH (ref 4.8–5.6)
Mean Plasma Glucose: 269 mg/dL

## 2024-04-03 LAB — TSH: TSH: 5.936 u[IU]/mL — ABNORMAL HIGH (ref 0.350–4.500)

## 2024-04-03 MED ORDER — POTASSIUM CHLORIDE CRYS ER 20 MEQ PO TBCR
40.0000 meq | EXTENDED_RELEASE_TABLET | Freq: Once | ORAL | Status: AC
  Administered 2024-04-03: 40 meq via ORAL
  Filled 2024-04-03: qty 2

## 2024-04-03 MED ORDER — LACTATED RINGERS IV BOLUS
500.0000 mL | Freq: Once | INTRAVENOUS | Status: AC
  Administered 2024-04-03: 500 mL via INTRAVENOUS

## 2024-04-03 MED ORDER — OXYCODONE HCL 5 MG PO TABS
20.0000 mg | ORAL_TABLET | Freq: Four times a day (QID) | ORAL | Status: DC | PRN
  Administered 2024-04-03 – 2024-04-04 (×3): 20 mg via ORAL
  Filled 2024-04-03 (×3): qty 4

## 2024-04-03 MED ORDER — MORPHINE SULFATE (PF) 2 MG/ML IV SOLN
2.0000 mg | INTRAVENOUS | Status: DC | PRN
  Administered 2024-04-03: 2 mg via INTRAVENOUS
  Filled 2024-04-03: qty 1

## 2024-04-03 NOTE — Progress Notes (Signed)
 PROGRESS NOTE    Patient: [name]                            PCP: Audria Leather, MD                    DOB: 21-Feb-1956            DOA: 04/01/2024 [id number]             DOS: 04/03/2024, 11:40 AM   LOS: 1 day   Date of Service: The patient was seen and examined on 04/03/2024  Subjective:   The patient was seen and examined this morning, still complaining of generalized aches and pain  Per nursing staff was complaining of 10 out of 10 pain yesterday evening, utilized morphine -which helped.  Patient was admitted encephalopathic-discussed with patient of judicial use of medication as we would like her to be awake Pending hospice coming on board  Brief Narrative:   [name] is a 68 y.o. female with medical history significant of rectal cancer stage 2C under outpatient palliative care, hypothyroidism, T2DM, hypertension., obesity and chronic anemia who presented with altered mental status.  Patient has a very poor functional physical capacity, she has been bed bound for many months, due to generalized weakness. Her cancer therapy has been held due to deconditioning and she is under palliative care services.  She has a chronic foley catheter and recurrent urinary tract infections.    Today around dinner time she became less responsive to her daughter, very lethargic and slow to respond.  Apparently she had been at her baseline until then.  Because severe symptoms her daughter called EMS, she was found hypotensive 90/51 and tachycardic 110 to 120 bpm, received IV fluids 500 ml and was transported to the ED.    At the time of my examination patient denies any chest pain or dyspnea, no fever or chills, no nausea, vomiting or diarrhea.  Continue feeling very weak.      Assessment & Plan:   Principal Problem: Sepsis    Hypotension UTI   Active Problems:   Hypothyroidism following radioiodine therapy   Type 2 diabetes mellitus with complication, with long-term current  use of insulin  (HCC)   GERD (gastroesophageal reflux disease)   Rectal cancer (HCC)   Obesity, class 2   Iron  deficiency anemia   Depression   Sepsis secondary to UTI (HCC)   Assessment and Plan: *Sepsis due to UTI - hypotension -with encephalopathy -Hypotensive, improved encephalopathy-likely exacerbated by infection and polypharmacy, high narcotics dose  POA:  Met sepsis criteria on admission with hypotension, hypothermia, tachycardia, lactic acidosis   - Continue IV fluid resuscitation, IV antibiotics  - urinary tract infections, related to chronic indwelling foley catheter - Prior cultures positive for Klebsiella pneumonia ESBL.  Noted port A cath in place.  -Will follow the blood and urine cultures     Hypotensive  Likely due to sepsis, superimposed with heavy narcotics -Blood pressure reading low - Discussed with patient - Bolus IV fluid-monitoring BP closely   Hypokalemia -replacing orally   hypothyroidism following radioiodine therapy TSH elevated to 5.052 >>5.9  Continue levothyroxine  increasing dose to 150 mcg -  check T3 and T4: 0.96  Considering her somnolence and peripheral edema, need to rule out symptomatic hypothyroidism, myxedema.     Type 2 diabetes mellitus  - With history of long-acting insulin  use, -Monitoring blood sugars, checking CBG q.  ACHS, SSI coverage Uncontrolled hyperglycemia.  CBG (last 3)  Recent Labs    04/02/24 2121 04/03/24 0736 04/03/24 1124  GLUCAP 268* 187* 195*      GERD - Continue with pantoprazole .    Rectal cancer [facility]) Patient not longer under therapy due to severe deconditioning.  Patient under palliative care services as outpatient Advanced directives DNR   Obesity, class 2 Calculated BMI is 38,1    Depression/ Chronic pain syndrome.  -Resuming fluoxetine  and gabapentin  -Patient is more awake, requesting her narcotics Hold on cyclobenzaprine, pregabalin  and quetiapine  for now due to somnolence and acute  metabolic/ toxic encephalopathy.  -Discussed with patient the judicial use of narcotics -Pending Hospice coming on board      Ethics: DNR limited Palliative care consult, pending hospice coming back on board Due to multiple comorbidities,-sepsis due to UTI with chronic indwelling catheter-anticipating steep decline physical and mental status. Poor prognosis --recommending hospice, comfort care     Advance Care Planning:   Code Status: Limited: Do not attempt resuscitation (DNR) -DNR-LIMITED -Do Not Intubate/DNI    -------------------------------------------------------------------------------------------------------------------------------------------- Nutritional status:  The patient's BMI is: Body mass index is 35.23 kg/m. I agree with the assessment and plan as outlined   Skin Assessment: I have examined the patient's skin and I agree with the wound assessment as performed by wound care team As outlined belowe: Pressure Injury 06/01/23 Coccyx Stage 2 -  Partial thickness loss of dermis presenting as a shallow open injury with a red, pink wound bed without slough. redness 10cm x 3 cm (Active)  06/01/23 1607  Location: Coccyx  Location Orientation:   Staging: Stage 2 -  Partial thickness loss of dermis presenting as a shallow open injury with a red, pink wound bed without slough.  Wound Description (Comments): redness 10cm x 3 cm  Present on Admission: Yes     ---------------------------------------------------------------------------------------------------------------------------------------------------- Cultures;  Urine Culture  >>>   ------------------------------------------------------------------------------------------------------------------------------------------------  DVT prophylaxis:  SCDs Start: 04/02/24 0246   Code Status:   Code Status: Limited: Do not attempt resuscitation (DNR) -DNR-LIMITED -Do Not Intubate/DNI   Family Communication:  -Advance  care planning has been discussed.   Admission status:   Status is: Observation The patient remains OBS appropriate and will d/c before 2 midnights.   Disposition: From  - home             Planning for discharge in 2 days: to hospice  Procedures:   No admission procedures for hospital encounter.   Antimicrobials:  Anti-infectives (From admission, onward)    Start     Dose/Rate Route Frequency Ordered Stop   04/02/24 0200  meropenem (MERREM) 1 g in sodium chloride  0.9 % 100 mL IVPB        1 g 200 mL/hr over 30 Minutes Intravenous Every 8 hours 04/02/24 0147     04/01/24 2300  vancomycin  (VANCOREADY) IVPB 2000 mg/400 mL        2,000 mg 200 mL/hr over 120 Minutes Intravenous  Once 04/01/24 2257 04/02/24 0109   04/01/24 2245  ceFEPIme  (MAXIPIME ) 2 g in sodium chloride  0.9 % 100 mL IVPB        2 g 200 mL/hr over 30 Minutes Intravenous  Once 04/01/24 2239 04/02/24 0143        Medication:   Chlorhexidine  Gluconate Cloth  6 each Topical Daily   FLUoxetine   20 mg Oral Daily   gabapentin   100 mg Oral TID   insulin  aspart  0-15 Units Subcutaneous TID WC  lactobacillus  1 g Oral TID WC   levothyroxine   150 mcg Oral Q0600   pantoprazole   40 mg Oral BID AC   QUEtiapine   100 mg Oral QHS    acetaminophen  **OR** acetaminophen , morphine  injection, ondansetron  **OR** ondansetron  (ZOFRAN ) IV, oxyCODONE    Objective:   Vitals:   04/02/24 0739 04/02/24 1128 04/02/24 2107 04/03/24 0330  BP: (!) 106/50 105/86 101/62 (!) 86/60  Pulse: (!) 103 (!) 103 93 97  Resp: 18 20 19 19   Temp: 98.2 F (36.8 C) 98.6 F (37 C) 98.9 F (37.2 C) 98.7 F (37.1 C)  TempSrc: Oral Oral Oral Oral  SpO2: 99% 100% 96% 97%  Weight:      Height:        Intake/Output Summary (Last 24 hours) at 04/03/2024 1140 Last data filed at 04/03/2024 0916 Gross per 24 hour  Intake 699.93 ml  Output 1600 ml  Net -900.07 ml   Filed Weights   04/01/24 2147 04/02/24 0245  Weight: 113.4 kg 105.1 kg      Physical examination:        General:  AAO x 2,  cooperative, no distress;   HEENT:  Normocephalic, PERRL, otherwise with in Normal limits   Neuro:  CNII-XII intact. , normal motor and sensation, reflexes intact   Lungs:   Clear to auscultation BL, Respirations unlabored,  No wheezes / crackles  Cardio:    S1/S2, RRR, No murmure, No Rubs or Gallops   Abdomen:  Soft, non-tender, bowel sounds active all four quadrants, no guarding or peritoneal signs.  Muscular  skeletal:  Bedbound, with contracted lower extremities, +2 edema  Limited exam -global generalized weaknesses - in bed, able to move upper extremities 2+ pulses,  symmetric, No pitting edema  Skin:  Dry, warm to touch, negative for any Rashes,  Wounds: Please see nursing documentation  Pressure Injury 06/01/23 Coccyx Stage 2 -  Partial thickness loss of dermis presenting as a shallow open injury with a red, pink wound bed without slough. redness 10cm x 3 cm (Active)  06/01/23 1607  Location: Coccyx  Location Orientation:   Staging: Stage 2 -  Partial thickness loss of dermis presenting as a shallow open injury with a red, pink wound bed without slough.  Wound Description (Comments): redness 10cm x 3 cm  Present on Admission: Yes            ------------------------------------------------------------------------------------------------------------------------------------------    LABs:     Latest Ref Rng & Units 04/02/2024    4:04 AM 04/01/2024   10:30 PM 11/21/2023    3:32 PM  CBC  WBC 4.0 - 10.5 K/uL 6.7  6.2  4.0   Hemoglobin 12.0 - 15.0 g/dL 16.1  09.6  04.5   Hematocrit 36.0 - 46.0 % 36.8  44.4  38.0   Platelets 150 - 400 K/uL 143  106  101       Latest Ref Rng & Units 04/03/2024    4:13 AM 04/02/2024    4:04 AM 04/01/2024   10:30 PM  CMP  Glucose 70 - 99 mg/dL 409  811  914   BUN 8 - 23 mg/dL 11  13  12    Creatinine 0.44 - 1.00 mg/dL 7.82  9.56  2.13   Sodium 135 - 145 mmol/L 134  130  133    Potassium 3.5 - 5.1 mmol/L 3.3  3.8  3.8   Chloride 98 - 111 mmol/L 97  94  94   CO2 22 - 32  mmol/L 27  29  28    Calcium  8.9 - 10.3 mg/dL 8.0  8.2  8.5   Total Protein 6.5 - 8.1 g/dL   6.5   Total Bilirubin 0.0 - 1.2 mg/dL   0.5   Alkaline Phos 38 - 126 U/L   109   AST 15 - 41 U/L   13   ALT 0 - 44 U/L   7        Micro Results Recent Results (from the past 240 hours)  Culture, blood (Routine X 2) w Reflex to ID Panel     Status: None (Preliminary result)   Collection Time: 04/02/24 10:47 AM   Specimen: Left Antecubital; Blood  Result Value Ref Range Status   Specimen Description   Final    LEFT ANTECUBITAL BOTTLES DRAWN AEROBIC AND ANAEROBIC   Special Requests Blood Culture adequate volume  Final   Culture   Final    NO GROWTH < 24 HOURS Performed at [facility], [address], [place], [place] [address]    Report Status PENDING  Incomplete  Culture, blood (Routine X 2) w Reflex to ID Panel     Status: None (Preliminary result)   Collection Time: 04/02/24 10:47 AM   Specimen: BLOOD LEFT HAND  Result Value Ref Range Status   Specimen Description   Final    BLOOD LEFT HAND BOTTLES DRAWN AEROBIC AND ANAEROBIC   Special Requests Blood Culture adequate volume  Final   Culture   Final    NO GROWTH < 24 HOURS Performed at [facility], [address], [place], [place] [address]    Report Status PENDING  Incomplete    Radiology Reports No results found.   SIGNED: Bobbetta Burnet, MD, FHM. FAAFP. Arlin Benes - [facility] Time spent - 55 min.  In seeing, evaluating and examining the patient. Reviewing medical records, labs, drawn plan of care. [facility],  Pager (please use [email or web address] to page/ text) Please use Epic Secure Chat for non-urgent communication (7AM-7PM)  If 7PM-7AM, please contact night-coverage [email or web address], 04/03/2024, 11:40 AM

## 2024-04-03 NOTE — Consult Note (Signed)
 Consultation Note Date: 04/03/2024   Patient Name: [name]  DOB: [birth year]  MRN: [id number]  Age / Sex: 68 y.o., female  PCP: Audria Leather, MD Referring Physician: Bobbetta Burnet, MD  Reason for Consultation: Establishing goals of care  HPI/Patient Profile: 68 y.o. female  with past medical history of rectal cancer stage 2C, [facility], chronic Foley catheter with recurrent UTIs, hypothyroidism, T2DM, HTN, obesity, chronic anemia admitted on 04/01/2024 with septic UTI.   Clinical Assessment and Goals of Care: I have reviewed medical records including EPIC notes, labs and imaging, received report from RN, assessed the patient.  [name] is lying quietly in bed.  She appears chronically ill and somewhat frail.  She greets me, making and mostly keeping eye contact.  She is alert and oriented x 3, able to make her needs known.  There is no family at bedside at this time.   We needed the bedside to discuss diagnosis prognosis, GOC, EOL wishes, disposition and options. I introduced Palliative Medicine as specialized medical care for people living with serious illness. It focuses on providing relief from the symptoms and stress of a serious illness. The goal is to improve quality of life for both the patient and the family.  We focused on their current illness.  We talked about [name] UTI and the treatment plan.  We talked about chronic Foley and the risk for recurrent UTI.  [name] states that she was just treated for urinary tract infection and understands that she will continue to have UTIs.  She states that she became unresponsive at home and they are considering that she may have overmedicated herself which she endorses may be possible.  The natural disease trajectory and expectations at EOL were discussed.  Advanced directives, concepts specific to code status, artifical feeding and  hydration, and rehospitalization were considered and discussed.  Continue to treat the treatable but no CPR or intubation  Hospice Care services outpatient were discussed.  [name] is active with [facility] outpatient hospice services.  She states that she would return home with the benefits of [facility] already in place.  She states that she has discussed do not rehospitalize with her daughter, [name] who lives in her home.  She shares that [name] panicked and called 911 instead of hospice services.  Mrs.'s states that she will make a list for her daughter for when this happens again.  Discussed the importance of continued conversation with family and the medical providers regarding overall plan of care and treatment options, ensuring decisions are within the context of the patient's values and GOCs.  Questions and concerns were addressed. The family was encouraged to call with questions or concerns.  PMT will continue to support holistically.  Conference with attending, bedside nursing staff, transition of care team related to patient condition, needs, goals of care, disposition.    HCPOA  HCPOA -daughter, April Winchester    SUMMARY OF RECOMMENDATIONS   Return home with the benefits of [facility] hosp[facility] services already  in place. Requesting do not rehospitalize  Preferred place of death is home     Code Status/Advance Care Planning: DNR  Symptom Management:  Per hospitalist, no additional needs at this time.  Palliative Prophylaxis:  Frequent Pain Assessment and Oral Care  Additional Recommendations (Limitations, Scope, Preferences): Home with the benefits of hospice services already in place  Psycho-social/Spiritual:  Desire for further Chaplaincy support:no Additional Recommendations: Caregiving  Support/Resources  Prognosis:  < 6 months, or less based on chronic illness burden.  Discharge Planning: Home with Hospice      Primary Diagnoses: Present on  Admission:  Hypotension  Iron  deficiency anemia  Hypothyroidism following radioiodine therapy  GERD (gastroesophageal reflux disease)  Rectal cancer (HCC)  Obesity, class 2  Depression  Sepsis secondary to UTI (HCC)  Sepsis (HCC)   I have reviewed the medical record, interviewed the patient and family, and examined the patient. The following aspects are pertinent.  Past Medical History:  Diagnosis Date   Anxiety    Cancer (HCC)    rectal   Coronary atherosclerosis    Cardiac CT 09/2018 with calcium  score 8.7 and mild proximal LAD disease   Essential hypertension 2009   GERD (gastroesophageal reflux disease)    Hypothyroidism    Iron  deficiency anemia    Osteoarthritis    Palpitations    Tachypalpitations on beta blockers since 2010   Type 2 diabetes mellitus (HCC) 2003   Vitamin B 12 deficiency    Vitamin D  deficiency    Social History   Socioeconomic History   Marital status: Single    Spouse name: Not on file   Number of children: 2   Years of education: Not on file   Highest education level: Not on file  Occupational History   Occupation: DISABLED    Employer: UNEMPLOYED    Comment: [employer]  Tobacco Use   Smoking status: Never   Smokeless tobacco: Never  Vaping Use   Vaping status: Never Used  Substance and Sexual Activity   Alcohol use: No   Drug use: No   Sexual activity: Never  Other Topics Concern   Not on file  Social History Narrative   Has 2 grandchildren. 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   Social Drivers of Corporate investment banker Strain: Not on file  Food Insecurity: No Food Insecurity (04/02/2024)   Hunger Vital Sign    Worried About Running Out of Food in the Last Year: Never true    Ran Out of Food in the Last Year: Never true  Transportation Needs: No Transportation Needs (04/02/2024)   PRAPARE - Administrator, Civil Service (Medical): No    Lack of Transportation (Non-Medical): No   Physical Activity: Not on file  Stress: Not on file  Social Connections: Moderately Isolated (04/02/2024)   Social Connection and Isolation Panel [NHANES]    Frequency of Communication with Friends and Family: Twice a week    Frequency of Social Gatherings with Friends and Family: Twice a week    Attends Religious Services: 1 to 4 times per year    Active Member of [employer] or Organizations: No    Attends Banker Meetings: Never    Marital Status: Separated   Family History  Problem Relation Age of Onset   Heart failure Mother    Diabetes Father    Hypertension Father    Stroke Sister 75   Diabetes Brother    Cancer - Colon  Other        age 43   Colon polyps Neg Hx    Scheduled Meds:  Chlorhexidine  Gluconate Cloth  6 each Topical Daily   FLUoxetine   20 mg Oral Daily   gabapentin   100 mg Oral TID   insulin  aspart  0-15 Units Subcutaneous TID WC   lactobacillus  1 g Oral TID WC   levothyroxine   150 mcg Oral Q0600   pantoprazole   40 mg Oral BID AC   QUEtiapine   100 mg Oral QHS   Continuous Infusions:  meropenem (MERREM) IV 1 g (04/03/24 0222)   PRN Meds:.acetaminophen  **OR** acetaminophen , morphine  injection, ondansetron  **OR** ondansetron  (ZOFRAN ) IV, oxyCODONE  Medications Prior to Admission:  Prior to Admission medications   Medication Sig Start Date End Date Taking? Authorizing Provider  cyclobenzaprine (FLEXERIL) 10 MG tablet Take 10 mg by mouth 2 (two) times daily as needed for muscle spasms.   Yes [provider]  fluconazole  (DIFLUCAN ) 100 MG tablet Take 100 mg by mouth daily. 03/24/24  Yes [provider]  FLUoxetine  (PROZAC ) 20 MG capsule Take 20 mg by mouth daily.   Yes [provider]  hydrOXYzine  (ATARAX ) 25 MG tablet Take 25 mg by mouth 2 (two) times daily. 03/06/24  Yes [provider]  levothyroxine  (SYNTHROID ) 88 MCG tablet TAKE ONE TABLET BY MOUTH DAILY BEFORE BREAKFAST 12/16/23  Yes Reardon, Laurina Popper J, NP  Oxycodone   HCl 20 MG TABS Take 1 tablet by mouth every 4 (four) hours as needed. 03/31/24  Yes [provider]  pantoprazole  (PROTONIX ) 40 MG tablet Take 1 tablet (40 mg total) by mouth 2 (two) times daily before a meal. 04/13/23  Yes Emokpae, Courage, MD  pregabalin  (LYRICA ) 100 MG capsule Take 100 mg by mouth 2 (two) times daily.   Yes [provider]  QUEtiapine  (SEROQUEL ) 100 MG tablet Take 1 tablet (100 mg total) by mouth at bedtime. 12/21/23  Yes Paulett Boros, MD  blood glucose meter kit and supplies KIT Dispense based on patient and insurance preference. Use up to four times daily as directed. (FOR ICD-10 E11.65) Number of Strips 400 Number of Lancets 400 02/16/23   Nida, Gebreselassie W, MD  Blood Glucose Monitoring Suppl (TRUE METRIX METER) w/Device KIT 1 each by Does not apply route 2 (two) times daily. Use to test BG bid E11.65 03/19/23   Wendel Hals, NP  dicyclomine  (BENTYL ) 10 MG capsule TAKE 1 CAPSULE EVERY 8 HOURS AS NEEDED FOR SPASMS. Patient not taking: Reported on 04/02/2024 02/14/24   Katragadda, Sreedhar, MD  diphenoxylate -atropine  (LOMOTIL ) 2.5-0.025 MG tablet Take 1 tablet by mouth 4 (four) times daily. Patient not taking: Reported on 11/21/2023 03/25/23   Sonnie Dusky, PA-C  fentaNYL  (DURAGESIC ) 25 MCG/HR Place 1 patch onto the skin every 3 (three) days. Patient not taking: Reported on 04/02/2024 03/13/24   [provider]  hydrOXYzine  (VISTARIL ) 25 MG capsule Take 1 capsule (25 mg total) by mouth 2 (two) times daily as needed for anxiety. Patient not taking: Reported on 04/02/2024 12/14/23   Paulett Boros, MD  Insulin  Pen Needle (DROPLET PEN NEEDLES) 31G X 5 MM MISC USE AS INSTRUCTED TO INJECT INSULIN  FOUR TIMES DAILY 05/26/23   Wendel Hals, NP  magnesium  oxide (MAG-OX) 400 (240 Mg) MG tablet Take 2 tablets (800 mg total) by mouth daily. Patient not taking: Reported on 04/02/2024 05/19/23   Paulett Boros, MD  Misc. Devices  (ROLLATOR ULTRA-LIGHT) MISC Rollator walker 04/27/23   [provider]  oxyCODONE  (OXY IR/ROXICODONE ) 5 MG immediate release tablet Take 1 tablet (5 mg total) by mouth every 12 (twelve) hours as needed for severe pain (pain score 7-10). Patient not taking: Reported on 04/02/2024 12/09/23   Paulett Boros, MD  TRUE METRIX BLOOD GLUCOSE TEST test strip TEST BLOOD SUGAR TWICE DAILY 11/10/22   Nida, Gebreselassie W, MD   Allergies  Allergen Reactions   Contrast Media [Iodinated Contrast Media] Other (See Comments)    Burning   Sulfa Antibiotics Nausea And Vomiting    Other reaction(s): GI Upset (intolerance) unknown   Doxepin Rash and Swelling   Tape Rash   Review of Systems  Unable to perform ROS: Other    Physical Exam Vitals and nursing note reviewed.     Vital Signs: BP (!) 86/60 (BP Location: Left Arm)   Pulse 97   Temp 98.7 F (37.1 C) (Oral)   Resp 19   Ht 5\' 8"  (1.727 m)   Wt 105.1 kg   SpO2 97%   BMI 35.23 kg/m  Pain Scale: 0-10   Pain Score: 10-Worst pain ever   SpO2: SpO2: 97 % O2 Device:SpO2: 97 % O2 Flow Rate: .   IO: Intake/output summary:  Intake/Output Summary (Last 24 hours) at 04/03/2024 0839 Last data filed at 04/03/2024 5409 Gross per 24 hour  Intake 579.93 ml  Output 1600 ml  Net -1020.07 ml    LBM: Last BM Date : 04/02/24 Baseline Weight: Weight: 113.4 kg Most recent weight: Weight: 105.1 kg     Palliative Assessment/Data:     Time In: 1100 Time Out: 1155 Time Total: 55 minutes Greater than 50%  of this time was spent counseling and coordinating care related to the above assessment and plan.  Signed by: Annabelle Barrack, NP   Please contact Palliative Medicine Team phone at [phone] for questions and concerns.  For individual provider: See [facility] Foley

## 2024-04-03 NOTE — Plan of Care (Signed)

## 2024-04-03 NOTE — TOC Progression Note (Signed)
 Transition of Care [facility]) - Progression Note    Patient Details  Name: [name] MRN: [id number] Date of Birth: August 18, 1956  Transition of Care [facility]) CM/SW Contact  Ander Katos, [place] Phone Number: 04/03/2024, 2:19 PM  Clinical Narrative: [facility] RN met with pt/family. Plan is to return home with hospice. LCSW confirmed with daughter. Anticipate d/c tomorrow. MD updated.       Expected Discharge Plan: Home w Hospice Care Barriers to Discharge: Continued Medical Work up  Expected Discharge Plan and Services     Post Acute Care Choice: Hospice Living arrangements for the past 2 months: Single Family Home                                       Social Determinants of Health (SDOH) Interventions SDOH Screenings   Food Insecurity: No Food Insecurity (04/02/2024)  Housing: Low Risk  (04/02/2024)  Transportation Needs: No Transportation Needs (04/02/2024)  Utilities: Not At Risk (04/02/2024)  Depression (PHQ2-9): Low Risk  (01/06/2023)  Social Connections: Moderately Isolated (04/02/2024)  Tobacco Use: Low Risk  (04/03/2024)    Readmission Risk Interventions    04/03/2024    7:42 AM 06/07/2023   11:28 AM  Readmission Risk Prevention Plan  Transportation Screening Complete Complete  PCP or Specialist Appt within 3-5 Days  Complete  HRI or Home Care Consult Complete Complete  Social Work Consult for Recovery Care Planning/Counseling Complete Complete  Palliative Care Screening Not Applicable Not Applicable  Medication Review Oceanographer) Complete Complete

## 2024-04-04 DIAGNOSIS — A419 Sepsis, unspecified organism: Secondary | ICD-10-CM | POA: Diagnosis not present

## 2024-04-04 DIAGNOSIS — N390 Urinary tract infection, site not specified: Secondary | ICD-10-CM | POA: Diagnosis not present

## 2024-04-04 LAB — BASIC METABOLIC PANEL WITH GFR
Anion gap: 7 (ref 5–15)
BUN: 9 mg/dL (ref 8–23)
CO2: 27 mmol/L (ref 22–32)
Calcium: 8.1 mg/dL — ABNORMAL LOW (ref 8.9–10.3)
Chloride: 97 mmol/L — ABNORMAL LOW (ref 98–111)
Creatinine, Ser: 0.57 mg/dL (ref 0.44–1.00)
GFR, Estimated: >60 mL/min (ref >60)
Glucose, Bld: 311 mg/dL — ABNORMAL HIGH (ref 70–99)
Potassium: 3.8 mmol/L (ref 3.5–5.1)
Sodium: 131 mmol/L — ABNORMAL LOW (ref 135–145)

## 2024-04-04 LAB — TSH: TSH: 7.637 u[IU]/mL — ABNORMAL HIGH (ref 0.350–4.500)

## 2024-04-04 LAB — URINE CULTURE

## 2024-04-04 LAB — GLUCOSE, CAPILLARY
Glucose-Capillary: 305 mg/dL — ABNORMAL HIGH (ref 70–99)
Glucose-Capillary: 229 mg/dL — ABNORMAL HIGH (ref 70–99)

## 2024-04-04 LAB — T3, FREE: T3, Free: 1.6 pg/mL — ABNORMAL LOW (ref 2.0–4.4)

## 2024-04-04 MED ORDER — LACTINEX PO CHEW: 1.0000 | CHEWABLE_TABLET | Freq: Three times a day (TID) | ORAL | 0 refills | Status: AC

## 2024-04-04 MED ORDER — FLUCONAZOLE 100 MG PO TABS: 100.0000 mg | ORAL_TABLET | Freq: Every day | ORAL | 2 refills | Status: AC

## 2024-04-04 MED ORDER — CIPROFLOXACIN HCL 500 MG PO TABS: 500.0000 mg | ORAL_TABLET | Freq: Two times a day (BID) | ORAL | 0 refills | Status: AC

## 2024-04-04 NOTE — Discharge Summary (Signed)
 Physician Discharge Summary   Patient: [name] MRN: [id number] DOB: 1956-04-21  Admit date:     04/01/2024  Discharge date: 04/04/24  Discharge Physician: Bobbetta Burnet   PCP: Audria Leather, MD   Recommendations at discharge:   Follow-up with hospice  Discharge Diagnoses: Principal Problem:   Sepsis secondary to UTI [facility]) Active Problems:   Hypotension   Hypothyroidism following radioiodine therapy   Type 2 diabetes mellitus with complication, with long-term current use of insulin  (HCC)   GERD (gastroesophageal reflux disease)   Rectal cancer (HCC)   Obesity, class 2   Iron  deficiency anemia   Depression   Sepsis [name]    Hospital Course: [name] is a 68 y.o. female with medical history significant of rectal cancer stage 2C under outpatient palliative care, hypothyroidism, T2DM, hypertension., obesity and chronic anemia who presented with altered mental status.  Patient has a very poor functional physical capacity, she has been bed bound for many months, due to generalized weakness. Her cancer therapy has been held due to deconditioning and she is under palliative care services.  She has a chronic foley catheter and recurrent urinary tract infections.    Today around dinner time she became less responsive to her daughter, very lethargic and slow to respond.  Apparently she had been at her baseline until then.  Because severe symptoms her daughter called EMS, she was found hypotensive 90/51 and tachycardic 110 to 120 bpm, received IV fluids 500 ml and was transported to the ED.    At the time of my examination patient denies any chest pain or dyspnea, no fever or chills, no nausea, vomiting or diarrhea.  Continue feeling very weak.    *Sepsis due to UTI - hypotension -with encephalopathy - Improved sepsis physiology, hypotension improved,  POA:  Met sepsis criteria on admission with hypotension, hypothermia, tachycardia, lactic acidosis   - Continue IV  fluid resuscitation, IV antibiotics Rocephin  switched to ciprofloxacin   - urinary tract infections, related to chronic indwelling foley catheter - Prior cultures positive for Klebsiella pneumonia ESBL.  Noted port A cath in place.  -Will follow the blood and urine cultures     Hypotensive  Likely due to sepsis, superimposed with heavy narcotics -Blood pressure reading low -stabilized   Hypokalemia -was replaced   hypothyroidism following radioiodine therapy TSH elevated to 5.052 >>5.9  Continue levothyroxine  increasing dose to 150 mcg -  check T3 and T4: 0.96  - Myxedema was ruled out   Type 2 diabetes mellitus  - With history of long-acting insulin  use, -Monitoring blood sugars, checking CBG q. ACHS, SSI coverage Uncontrolled hyperglycemia.  CBG (last 3)  Recent Labs    04/02/24 2121 04/03/24 0736 04/03/24 1124  GLUCAP 268* 187* 195*      GERD - Continue with pantoprazole .    Rectal cancer (HCC) Patient not longer under therapy due to severe deconditioning.  Patient under palliative care services as outpatient Advanced directives DNR   Obesity, class 2 Calculated BMI is 38,1    Depression/ Chronic pain syndrome.  -Resuming fluoxetine  and gabapentin  -Patient is more awake, requesting her narcotics -Gust with patient, recommended to discontinue cyclobenzaprine, gabapentin , pregabalin  and quetiapine  for now due to somnolence and acute metabolic/ toxic encephalopathy.  -Discussed with patient the judicial use of narcotics  - Board      Ethics: DNR limited Palliative care consult, pending hospice coming back on board Due to multiple comorbidities,-sepsis due to UTI with chronic indwelling catheter-anticipating steep decline  physical and mental status. Poor prognosis --recommending hospice, comfort care     Advance Care Planning:   Code Status: Limited: Do not attempt resuscitation (DNR) -DNR-LIMITED -Do Not Intubate/DNI    Asses       Consultants:  TOC, palliative, hospice  Disposition: Home Diet recommendation:  Discharge Diet Orders (From admission, onward)     Start     Ordered   04/04/24 0000  Diet - low sodium heart healthy        04/04/24 0856           Regular diet DISCHARGE MEDICATION: Allergies as of 04/04/2024       Reactions   Contrast Media [iodinated Contrast Media] Other (See Comments)   Burning   Sulfa Antibiotics Nausea And Vomiting   Other reaction(s): GI Upset (intolerance) unknown   Doxepin Rash, Swelling   Tape Rash        Medication List     STOP taking these medications    dicyclomine  10 MG capsule Commonly known as: BENTYL    diphenoxylate -atropine  2.5-0.025 MG tablet Commonly known as: LOMOTIL    fentaNYL  25 MCG/HR Commonly known as: DURAGESIC    hydrOXYzine  25 MG capsule Commonly known as: VISTARIL    hydrOXYzine  25 MG tablet Commonly known as: ATARAX    magnesium  oxide 400 (240 Mg) MG tablet Commonly known as: MAG-OX   pantoprazole  40 MG tablet Commonly known as: PROTONIX        TAKE these medications    blood glucose meter kit and supplies Kit Dispense based on patient and insurance preference. Use up to four times daily as directed. (FOR ICD-10 E11.65) Number of Strips 400 Number of Lancets 400   ciprofloxacin  500 MG tablet Commonly known as: Cipro  Take 1 tablet (500 mg total) by mouth 2 (two) times daily for 5 days.   cyclobenzaprine 10 MG tablet Commonly known as: FLEXERIL Take 10 mg by mouth 2 (two) times daily as needed for muscle spasms.   Droplet Pen Needles 31G X 5 MM Misc Generic drug: Insulin  Pen Needle USE AS INSTRUCTED TO INJECT INSULIN  FOUR TIMES DAILY   fluconazole  100 MG tablet Commonly known as: DIFLUCAN  Take 1 tablet (100 mg total) by mouth daily.   FLUoxetine  20 MG capsule Commonly known as: PROZAC  Take 20 mg by mouth daily.   lactobacillus acidophilus & bulgar chewable tablet Chew 1 tablet by mouth 3 (three) times daily with meals for  10 days.   levothyroxine  88 MCG tablet Commonly known as: SYNTHROID  TAKE ONE TABLET BY MOUTH DAILY BEFORE BREAKFAST   Oxycodone  HCl 20 MG Tabs Take 1 tablet by mouth every 4 (four) hours as needed. What changed: Another medication with the same name was removed. Continue taking this medication, and follow the directions you see here.   pregabalin  100 MG capsule Commonly known as: LYRICA  Take 100 mg by mouth 2 (two) times daily.   QUEtiapine  100 MG tablet Commonly known as: SEROQUEL  Take 1 tablet (100 mg total) by mouth at bedtime.   Rollator Ultra-Light Misc Rollator walker   True Metrix Blood Glucose Test test strip Generic drug: glucose blood TEST BLOOD SUGAR TWICE DAILY   True Metrix Meter w/Device Kit 1 each by Does not apply route 2 (two) times daily. Use to test BG bid E11.65        Discharge Exam: [facility] Weights   04/01/24 2147 04/02/24 0245  Weight: 113.4 kg 105.1 kg        General:  AAO x 3,  cooperative, no distress;  HEENT:  Normocephalic, PERRL, otherwise with in Normal limits   Neuro:  CNII-XII intact. , normal motor and sensation, reflexes intact   Lungs:   Clear to auscultation BL, Respirations unlabored,  No wheezes / crackles  Cardio:    S1/S2, RRR, No murmure, No Rubs or Gallops   Abdomen:  Soft, non-tender, bowel sounds active all four quadrants, no guarding or peritoneal signs.  Muscular  skeletal:  Abdomen, bilateral lower extremity contraction Limited exam -severe global generalized weaknesses - in bed, able to move all 4 extremities,   2+ pulses,  symmetric, +2 pitting edema  Skin:  Dry, warm to touch, negative for any Rashes,  Wounds: Please see nursing documentation Chronic Foley catheter in place  Pressure Injury 06/01/23 Coccyx Stage 2 -  Partial thickness loss of dermis presenting as a shallow open injury with a red, pink wound bed without slough. redness 10cm x 3 cm (Active)  06/01/23 1607  Location: Coccyx  Location  Orientation:   Staging: Stage 2 -  Partial thickness loss of dermis presenting as a shallow open injury with a red, pink wound bed without slough.  Wound Description (Comments): redness 10cm x 3 cm  Present on Admission: Yes          Condition at discharge: poor  The results of significant diagnostics from this hospitalization (including imaging, microbiology, ancillary and laboratory) are listed below for reference.   Imaging Studies: CT CHEST ABDOMEN PELVIS WO CONTRAST Result Date: 04/02/2024 CLINICAL DATA:  Sepsis. AMS after family noticed pt had a decreased LOC when they came back from running errands today. Pt currently on hospice care [facility]) and has hx of rectal cancer. Pt also has hx of UTI's and has an indwelling fole EXAM: CT CHEST, ABDOMEN AND PELVIS WITHOUT CONTRAST TECHNIQUE: Multidetector CT imaging of the chest, abdomen and pelvis was performed following the standard protocol without IV contrast. RADIATION DOSE REDUCTION: This exam was performed according to the departmental dose-optimization program which includes automated exposure control, adjustment of the mA and/or kV according to patient size and/or use of iterative reconstruction technique. COMPARISON:  CT abdomen pelvis 10/11/2023 FINDINGS: CT CHEST FINDINGS Cardiovascular: Right chest wall Port-A-Cath with tip in the distal superior vena cava. Normal heart size. No significant pericardial effusion. The thoracic aorta is normal in caliber. Mild atherosclerotic plaque of the thoracic aorta. At least 2 vessel coronary artery calcifications. Aortic valve calcifications. Mediastinum/Nodes: No gross hilar adenopathy, noting limited sensitivity for the detection of hilar adenopathy on this noncontrast study. No enlarged mediastinal, hilar, or axillary lymph nodes. Thyroid  gland, trachea, and esophagus demonstrate no significant findings. Lungs/Pleura: Expiratory phase of respiration. Diffuse bronchial wall thickening. No focal  consolidation. No pulmonary nodule. No pulmonary mass. No pleural effusion. No pneumothorax. Musculoskeletal: Partially visualized left breast with dermal thickening and subcutaneus soft tissue edema. No suspicious lytic or blastic osseous lesions. No acute displaced fracture. CT ABDOMEN PELVIS FINDINGS Hepatobiliary: No focal liver abnormality. Calcified gallstone noted within the gallbladder lumen. No gallbladder wall thickening or pericholecystic fluid. No biliary dilatation. Pancreas: Diffusely atrophic. No focal lesion. Otherwise normal pancreatic contour. No surrounding inflammatory changes. No main pancreatic ductal dilatation. Spleen: Spleen is enlarged measuring up to 16 cm. No focal splenic lesion. Adrenals/Urinary Tract: No adrenal nodule bilaterally. No nephrolithiasis and no hydronephrosis. Fluid density lesions within the kidneys likely represent simple renal cysts. Simple renal cysts, in the absence of clinically indicated signs/symptoms, require no independent follow-up. No ureterolithiasis or hydroureter. The urinary bladder is decompressed  with Foley catheter tip and inflated balloon within the lumen. Stomach/Bowel: Stomach is within normal limits. No evidence of small bowel wall thickening or dilatation. No large bowel dilatation. Interval worsening of irregular rectal wall thickening (2:15). Hypertrophied ileocecal valve. Stool throughout the colon. The appendix is not definitely identified with no inflammatory changes in the right lower quadrant to suggest acute appendicitis. Vascular/Lymphatic: No abdominal aorta or iliac aneurysm. Mild atherosclerotic plaque of the aorta and its branches. No abdominal, pelvic, or inguinal lymphadenopathy. Reproductive: Status post hysterectomy. No adnexal masses. Other: No intraperitoneal free fluid. No intraperitoneal free gas. No organized fluid collection. Musculoskeletal: Small fat containing right inguinal hernia. No suspicious lytic or blastic osseous  lesions. No acute displaced fracture. Multilevel degenerative changes of the spine. IMPRESSION: 1. No acute intrathoracic abnormality. 2. Partially visualized left breast with dermal thickening and subcutaneus soft tissue edema. Correlate with physical exam. 3. Interval worsening of rectal wall thickening concerning for recurrent malignancy. Markedly limited evaluation on this noncontrast study. Recommend further evaluation with colonoscopy or MRI with and without contrast. 4. Splenomegaly. 5. Cholelithiasis no acute cholecystitis. 6. Stool throughout the colon-correlate for constipation. 7. Aortic Atherosclerosis (ICD10-I70.0). 8. Limited evaluation on this noncontrast study. Electronically Signed   By: Morgane  Naveau M.D.   On: 04/02/2024 00:22   DG Chest Port 1 View Result Date: 04/01/2024 CLINICAL DATA:  Questionable sepsis - evaluate for abnormality EXAM: PORTABLE CHEST 1 VIEW COMPARISON:  Chest x-ray 11/20/2023, CT abdomen pelvis of 10/11/2023 FINDINGS: Patient is rotated. Similar-appearing right chest wall Port-A-Cath with tip overlying the expected region of the superior vena cava. The heart and mediastinal contours are unchanged. Low lung volumes. Question developing retrocardiac airspace opacity. No pulmonary edema. No pleural effusion. No pneumothorax. No acute osseous abnormality. IMPRESSION: Low lung volumes with question retrocardiac airspace opacity. Finding could represent developing infection or atelectasis. Limited evaluation due to patient rotation and low lung volumes. Consider repeat PA and lateral view of the chest for further evaluation. Electronically Signed   By: Morgane  Naveau M.D.   On: 04/01/2024 22:33    Microbiology: Results for orders placed or performed during the hospital encounter of 04/01/24  Urine Culture     Status: None (Preliminary result)   Collection Time: 04/02/24 12:19 AM   Specimen: Urine, Catheterized  Result Value Ref Range Status   Specimen Description    Final    URINE, CATHETERIZED Performed at [facility], [address] [place], [place] [address]    Special Requests   Final    NONE Reflexed from [id number] Performed at [facility], [address], [place], [place] [address]    Culture PENDING  Incomplete   Report Status PENDING  Incomplete  Culture, blood (Routine X 2) w Reflex to ID Panel     Status: None (Preliminary result)   Collection Time: 04/02/24 10:47 AM   Specimen: Left Antecubital; Blood  Result Value Ref Range Status   Specimen Description   Final    LEFT ANTECUBITAL BOTTLES DRAWN AEROBIC AND ANAEROBIC   Special Requests Blood Culture adequate volume  Final   Culture   Final    NO GROWTH 2 DAYS Performed at [facility], [address] [place], [place] [address]    Report Status PENDING  Incomplete  Culture, blood (Routine X 2) w Reflex to ID Panel     Status: None (Preliminary result)   Collection Time: 04/02/24 10:47 AM   Specimen: BLOOD LEFT HAND  Result Value Ref Range Status  Specimen Description   Final    BLOOD LEFT HAND BOTTLES DRAWN AEROBIC AND ANAEROBIC   Special Requests Blood Culture adequate volume  Final   Culture   Final    NO GROWTH 2 DAYS Performed at [facility], [address] [place], [place] [address]    Report Status PENDING  Incomplete    Labs: CBC: Recent Labs  Lab 04/01/24 2230 04/02/24 0404  WBC 6.2 6.7  NEUTROABS 4.2  --   HGB 13.6 11.4*  HCT 44.4 36.8  MCV 79.4* 80.3  PLT 106* 143*   Basic Metabolic Panel: Recent Labs  Lab 04/01/24 2230 04/02/24 0404 04/03/24 0413  NA 133* 130* 134*  K 3.8 3.8 3.3*  CL 94* 94* 97*  CO2 28 29 27   GLUCOSE 237* 300* 175*  BUN 12 13 11   CREATININE 0.54 0.57 0.53  CALCIUM  8.5* 8.2* 8.0*   Liver Function Tests: Recent Labs  Lab 04/01/24 2230  AST 13*  ALT 7  ALKPHOS 109  BILITOT 0.5  PROT 6.5  ALBUMIN 2.4*   CBG: Recent Labs  Lab 04/03/24 0736 04/03/24 1124 04/03/24 1606 04/03/24 2216 04/04/24 0739  GLUCAP  187* 195* 233* 320* 305*    Discharge time spent: greater than 45 minutes.  Signed: Bobbetta Burnet, MD [facility] 04/04/2024

## 2024-04-04 NOTE — Plan of Care (Signed)
 Problem: Education: Goal: Knowledge of General Education information will improve Description: Including pain rating scale, medication(s)/side effects and non-pharmacologic comfort measures 04/04/2024 1004 by Billey Budds, RN Outcome: Adequate for Discharge 04/04/2024 1004 by Billey Budds, RN Outcome: Adequate for Discharge   Problem: Health Behavior/Discharge Planning: Goal: Ability to manage health-related needs will improve 04/04/2024 1004 by Billey Budds, RN Outcome: Adequate for Discharge 04/04/2024 1004 by Billey Budds, RN Outcome: Adequate for Discharge   Problem: Clinical Measurements: Goal: Ability to maintain clinical measurements within normal limits will improve 04/04/2024 1004 by Billey Budds, RN Outcome: Adequate for Discharge 04/04/2024 1004 by Billey Budds, RN Outcome: Adequate for Discharge Goal: Will remain free from infection 04/04/2024 1004 by Billey Budds, RN Outcome: Adequate for Discharge 04/04/2024 1004 by Billey Budds, RN Outcome: Adequate for Discharge Goal: Diagnostic test results will improve 04/04/2024 1004 by Billey Budds, RN Outcome: Adequate for Discharge 04/04/2024 1004 by Billey Budds, RN Outcome: Adequate for Discharge Goal: Respiratory complications will improve 04/04/2024 1004 by Billey Budds, RN Outcome: Adequate for Discharge 04/04/2024 1004 by Billey Budds, RN Outcome: Adequate for Discharge Goal: Cardiovascular complication will be avoided 04/04/2024 1004 by Billey Budds, RN Outcome: Adequate for Discharge 04/04/2024 1004 by Billey Budds, RN Outcome: Adequate for Discharge   Problem: Activity: Goal: Risk for activity intolerance will decrease 04/04/2024 1004 by Billey Budds, RN Outcome: Adequate for Discharge 04/04/2024 1004 by Billey Budds, RN Outcome: Adequate for Discharge   Problem: Nutrition: Goal: Adequate nutrition will be maintained 04/04/2024 1004 by Billey Budds, RN Outcome:  Adequate for Discharge 04/04/2024 1004 by Billey Budds, RN Outcome: Adequate for Discharge   Problem: Coping: Goal: Level of anxiety will decrease 04/04/2024 1004 by Billey Budds, RN Outcome: Adequate for Discharge 04/04/2024 1004 by Billey Budds, RN Outcome: Adequate for Discharge   Problem: Elimination: Goal: Will not experience complications related to bowel motility 04/04/2024 1004 by Billey Budds, RN Outcome: Adequate for Discharge 04/04/2024 1004 by Billey Budds, RN Outcome: Adequate for Discharge Goal: Will not experience complications related to urinary retention 04/04/2024 1004 by Billey Budds, RN Outcome: Adequate for Discharge 04/04/2024 1004 by Billey Budds, RN Outcome: Adequate for Discharge   Problem: Pain Managment: Goal: General experience of comfort will improve and/or be controlled 04/04/2024 1004 by Billey Budds, RN Outcome: Adequate for Discharge 04/04/2024 1004 by Billey Budds, RN Outcome: Adequate for Discharge   Problem: Safety: Goal: Ability to remain free from injury will improve 04/04/2024 1004 by Billey Budds, RN Outcome: Adequate for Discharge 04/04/2024 1004 by Billey Budds, RN Outcome: Adequate for Discharge   Problem: Skin Integrity: Goal: Risk for impaired skin integrity will decrease 04/04/2024 1004 by Billey Budds, RN Outcome: Adequate for Discharge 04/04/2024 1004 by Billey Budds, RN Outcome: Adequate for Discharge   Problem: Education: Goal: Ability to describe self-care measures that may prevent or decrease complications (Diabetes Survival Skills Education) will improve 04/04/2024 1004 by Billey Budds, RN Outcome: Adequate for Discharge 04/04/2024 1004 by Billey Budds, RN Outcome: Adequate for Discharge Goal: Individualized Educational Video(s) 04/04/2024 1004 by Billey Budds, RN Outcome: Adequate for Discharge 04/04/2024 1004 by Billey Budds, RN Outcome: Adequate for Discharge   Problem:  Coping: Goal: Ability to adjust to condition or change in health will improve 04/04/2024 1004 by Billey Budds, RN Outcome: Adequate for Discharge 04/04/2024 1004 by  Billey Budds, RN Outcome: Adequate for Discharge   Problem: Fluid Volume: Goal: Ability to maintain a balanced intake and output will improve 04/04/2024 1004 by Billey Budds, RN Outcome: Adequate for Discharge 04/04/2024 1004 by Billey Budds, RN Outcome: Adequate for Discharge   Problem: Health Behavior/Discharge Planning: Goal: Ability to identify and utilize available resources and services will improve 04/04/2024 1004 by Billey Budds, RN Outcome: Adequate for Discharge 04/04/2024 1004 by Billey Budds, RN Outcome: Adequate for Discharge Goal: Ability to manage health-related needs will improve 04/04/2024 1004 by Billey Budds, RN Outcome: Adequate for Discharge 04/04/2024 1004 by Billey Budds, RN Outcome: Adequate for Discharge   Problem: Metabolic: Goal: Ability to maintain appropriate glucose levels will improve 04/04/2024 1004 by Billey Budds, RN Outcome: Adequate for Discharge 04/04/2024 1004 by Billey Budds, RN Outcome: Adequate for Discharge   Problem: Nutritional: Goal: Maintenance of adequate nutrition will improve 04/04/2024 1004 by Billey Budds, RN Outcome: Adequate for Discharge 04/04/2024 1004 by Billey Budds, RN Outcome: Adequate for Discharge Goal: Progress toward achieving an optimal weight will improve 04/04/2024 1004 by Billey Budds, RN Outcome: Adequate for Discharge 04/04/2024 1004 by Billey Budds, RN Outcome: Adequate for Discharge   Problem: Skin Integrity: Goal: Risk for impaired skin integrity will decrease 04/04/2024 1004 by Billey Budds, RN Outcome: Adequate for Discharge 04/04/2024 1004 by Billey Budds, RN Outcome: Adequate for Discharge   Problem: Tissue Perfusion: Goal: Adequacy of tissue perfusion will improve 04/04/2024 1004 by Billey Budds, RN Outcome: Adequate for Discharge 04/04/2024 1004 by Billey Budds, RN Outcome: Adequate for Discharge

## 2024-04-04 NOTE — TOC Progression Note (Signed)
 Transition of Care [facility]) - Progression Note    Patient Details  Name: [name] MRN: [id number] Date of Birth: [birth year]  Transition of [facility]) CM/SW Contact  Ander Kat[place], [place] Phone Number: 04/04/2024, 11:06 AM  Clinical Narrative:  Ova Bloomer with [facility] notified of d/c today.      Expected Discharge Plan: Home w Hospice Care Barriers to Discharge: Barriers Resolved  Expected Discharge Plan and Services     Post Acute Care Choice: Hospice Living arrangements for the past 2 months: Single Family Home Expected Discharge Date: 04/04/24                                     Social Determinants of Health (SDOH) Interventions SDOH Screenings   Food Insecurity: No Food Insecurity (04/02/2024)  Housing: Low Risk  (04/02/2024)  Transportation Needs: No Transportation Needs (04/02/2024)  Utilities: Not At Risk (04/02/2024)  Depression (PHQ2-9): Low Risk  (01/06/2023)  Social Connections: Moderately Isolated (04/02/2024)  Tobacco Use: Low Risk  (04/03/2024)    Readmission Risk Interventions    04/03/2024    7:42 AM 06/07/2023   11:28 AM  Readmission Risk Prevention Plan  Transportation Screening Complete Complete  PCP or Specialist Appt within 3-5 Days  Complete  HRI or Home Care Consult Complete Complete  Social Work Consult for Recovery Care Planning/Counseling Complete Complete  Palliative Care Screening Not Applicable Not Applicable  Medication Review Oceanographer) Complete Complete

## 2024-04-04 NOTE — Care Management Important Message (Signed)
 Important Message  Patient Details  Name: [name] MRN: [id number] Date of Birth: 07/02/1956   Important Message Given:  N/A - LOS <3 / Initial given by admissions     Neila Bally 04/04/2024, 10:57 AM

## 2024-04-07 LAB — CULTURE, BLOOD (ROUTINE X 2)
Special Requests: ADEQUATE
Special Requests: ADEQUATE

## 2024-04-10 ENCOUNTER — Encounter: Payer: Self-pay | Admitting: Hematology

## 2024-04-12 ENCOUNTER — Encounter: Payer: Self-pay | Admitting: Hematology

## 2024-05-16 ENCOUNTER — Other Ambulatory Visit: Payer: Self-pay | Admitting: *Deleted

## 2024-08-23 DEATH — deceased

## 2024-09-06 ENCOUNTER — Encounter (INDEPENDENT_AMBULATORY_CARE_PROVIDER_SITE_OTHER): Payer: Self-pay | Admitting: Gastroenterology
